# Patient Record
Sex: Male | Born: 1940 | Race: White | Hispanic: No | Marital: Married | Smoking: Former smoker
Health system: Southern US, Community
[De-identification: ages and names within clinical notes are randomized; demographics above are authoritative.]

## PROBLEM LIST (undated history)

## (undated) DIAGNOSIS — I2510 Atherosclerotic heart disease of native coronary artery without angina pectoris: Secondary | ICD-10-CM

## (undated) DIAGNOSIS — Z860101 Personal history of adenomatous and serrated colon polyps: Secondary | ICD-10-CM

## (undated) DIAGNOSIS — E785 Hyperlipidemia, unspecified: Secondary | ICD-10-CM

## (undated) DIAGNOSIS — H9190 Unspecified hearing loss, unspecified ear: Secondary | ICD-10-CM

## (undated) DIAGNOSIS — I4819 Other persistent atrial fibrillation: Secondary | ICD-10-CM

## (undated) DIAGNOSIS — IMO0002 Reserved for concepts with insufficient information to code with codable children: Secondary | ICD-10-CM

## (undated) DIAGNOSIS — Z86010 Personal history of colonic polyps: Secondary | ICD-10-CM

## (undated) DIAGNOSIS — G4730 Sleep apnea, unspecified: Secondary | ICD-10-CM

## (undated) DIAGNOSIS — G5620 Lesion of ulnar nerve, unspecified upper limb: Secondary | ICD-10-CM

## (undated) DIAGNOSIS — I10 Essential (primary) hypertension: Secondary | ICD-10-CM

## (undated) DIAGNOSIS — I872 Venous insufficiency (chronic) (peripheral): Secondary | ICD-10-CM

## (undated) DIAGNOSIS — I459 Conduction disorder, unspecified: Secondary | ICD-10-CM

## (undated) DIAGNOSIS — I442 Atrioventricular block, complete: Secondary | ICD-10-CM

## (undated) DIAGNOSIS — I4892 Unspecified atrial flutter: Secondary | ICD-10-CM

## (undated) HISTORY — PX: OTHER SURGICAL HISTORY: SHX169

## (undated) HISTORY — DX: Lesion of ulnar nerve, unspecified upper limb: G56.20

## (undated) HISTORY — DX: Unspecified atrial flutter: I48.92

## (undated) HISTORY — DX: Personal history of colonic polyps: Z86.010

## (undated) HISTORY — DX: Reserved for concepts with insufficient information to code with codable children: IMO0002

## (undated) HISTORY — DX: Hyperlipidemia, unspecified: E78.5

## (undated) HISTORY — DX: Conduction disorder, unspecified: I45.9

## (undated) HISTORY — DX: Venous insufficiency (chronic) (peripheral): I87.2

## (undated) HISTORY — DX: Personal history of adenomatous and serrated colon polyps: Z86.0101

## (undated) HISTORY — DX: Sleep apnea, unspecified: G47.30

## (undated) HISTORY — DX: Atherosclerotic heart disease of native coronary artery without angina pectoris: I25.10

## (undated) HISTORY — PX: VENTRAL HERNIA REPAIR: SHX424

## (undated) HISTORY — DX: Essential (primary) hypertension: I10

## (undated) HISTORY — PX: POLYPECTOMY: SHX149

---

## 1999-06-10 ENCOUNTER — Ambulatory Visit (HOSPITAL_COMMUNITY): Admission: RE | Admit: 1999-06-10 | Discharge: 1999-06-10 | Payer: Self-pay | Admitting: *Deleted

## 1999-06-10 ENCOUNTER — Encounter: Payer: Self-pay | Admitting: *Deleted

## 1999-07-27 ENCOUNTER — Inpatient Hospital Stay (HOSPITAL_COMMUNITY): Admission: RE | Admit: 1999-07-27 | Discharge: 1999-07-29 | Payer: Self-pay | Admitting: Orthopedic Surgery

## 1999-08-30 ENCOUNTER — Encounter: Admission: RE | Admit: 1999-08-30 | Discharge: 1999-11-25 | Payer: Self-pay | Admitting: Orthopedic Surgery

## 2000-11-21 ENCOUNTER — Encounter: Admission: RE | Admit: 2000-11-21 | Discharge: 2000-11-30 | Payer: Self-pay | Admitting: Specialist

## 2001-08-22 ENCOUNTER — Ambulatory Visit (HOSPITAL_COMMUNITY): Admission: RE | Admit: 2001-08-22 | Discharge: 2001-08-22 | Payer: Self-pay | Admitting: Gastroenterology

## 2003-09-13 HISTORY — PX: CORONARY ANGIOPLASTY WITH STENT PLACEMENT: SHX49

## 2003-10-07 ENCOUNTER — Inpatient Hospital Stay (HOSPITAL_COMMUNITY): Admission: EM | Admit: 2003-10-07 | Discharge: 2003-10-09 | Payer: Self-pay | Admitting: *Deleted

## 2003-10-17 ENCOUNTER — Inpatient Hospital Stay (HOSPITAL_COMMUNITY): Admission: EM | Admit: 2003-10-17 | Discharge: 2003-10-18 | Payer: Self-pay | Admitting: Emergency Medicine

## 2004-03-22 ENCOUNTER — Ambulatory Visit (HOSPITAL_COMMUNITY): Admission: RE | Admit: 2004-03-22 | Discharge: 2004-03-22 | Payer: Self-pay | Admitting: Gastroenterology

## 2004-07-07 ENCOUNTER — Ambulatory Visit: Payer: Self-pay | Admitting: Cardiovascular Disease

## 2004-07-16 ENCOUNTER — Ambulatory Visit: Payer: Self-pay | Admitting: Cardiovascular Disease

## 2004-10-15 ENCOUNTER — Ambulatory Visit: Payer: Self-pay | Admitting: Cardiovascular Disease

## 2004-10-22 ENCOUNTER — Inpatient Hospital Stay (HOSPITAL_BASED_OUTPATIENT_CLINIC_OR_DEPARTMENT_OTHER): Admission: RE | Admit: 2004-10-22 | Discharge: 2004-10-22 | Payer: Self-pay | Admitting: Cardiovascular Disease

## 2004-10-22 ENCOUNTER — Ambulatory Visit: Payer: Self-pay | Admitting: Cardiovascular Disease

## 2004-10-29 ENCOUNTER — Ambulatory Visit: Payer: Self-pay | Admitting: Internal Medicine

## 2004-11-02 ENCOUNTER — Ambulatory Visit: Payer: Self-pay | Admitting: Cardiovascular Disease

## 2005-02-01 ENCOUNTER — Ambulatory Visit: Payer: Self-pay | Admitting: Cardiovascular Disease

## 2005-03-17 ENCOUNTER — Ambulatory Visit: Payer: Self-pay | Admitting: Cardiovascular Disease

## 2005-04-25 ENCOUNTER — Ambulatory Visit: Payer: Self-pay | Admitting: Cardiovascular Disease

## 2005-05-20 ENCOUNTER — Ambulatory Visit: Payer: Self-pay | Admitting: Cardiovascular Disease

## 2005-08-27 ENCOUNTER — Emergency Department (HOSPITAL_COMMUNITY): Admission: EM | Admit: 2005-08-27 | Discharge: 2005-08-27 | Payer: Self-pay | Admitting: Emergency Medicine

## 2005-11-22 ENCOUNTER — Ambulatory Visit: Payer: Self-pay | Admitting: Cardiovascular Disease

## 2006-05-22 ENCOUNTER — Ambulatory Visit: Payer: Self-pay | Admitting: Cardiovascular Disease

## 2006-05-22 ENCOUNTER — Ambulatory Visit: Payer: Self-pay

## 2006-06-13 ENCOUNTER — Ambulatory Visit: Payer: Self-pay

## 2006-06-22 ENCOUNTER — Ambulatory Visit: Payer: Self-pay | Admitting: Cardiovascular Disease

## 2006-07-11 ENCOUNTER — Ambulatory Visit (HOSPITAL_COMMUNITY): Admission: RE | Admit: 2006-07-11 | Discharge: 2006-07-12 | Payer: Self-pay | Admitting: Orthopedic Surgery

## 2006-07-11 ENCOUNTER — Ambulatory Visit: Payer: Self-pay | Admitting: Cardiovascular Disease

## 2006-07-28 ENCOUNTER — Ambulatory Visit: Payer: Self-pay | Admitting: Cardiovascular Disease

## 2006-08-22 ENCOUNTER — Ambulatory Visit: Payer: Self-pay | Admitting: Pulmonary Disease

## 2006-10-18 ENCOUNTER — Ambulatory Visit: Payer: Self-pay | Admitting: Cardiovascular Disease

## 2007-04-25 ENCOUNTER — Ambulatory Visit: Payer: Self-pay

## 2007-04-25 LAB — CONVERTED CEMR LAB
ALT: 52 units/L (ref 0–53)
AST: 86 units/L — ABNORMAL HIGH (ref 0–37)
Cholesterol: 151 mg/dL (ref 0–200)
Direct LDL: 82.1 mg/dL
HDL: 28.9 mg/dL — ABNORMAL LOW (ref >39.0)
Total Protein: 9 g/dL — ABNORMAL HIGH (ref 6.0–8.3)

## 2007-04-26 ENCOUNTER — Ambulatory Visit: Payer: Self-pay | Admitting: Pulmonary Disease

## 2007-05-03 ENCOUNTER — Ambulatory Visit: Payer: Self-pay | Admitting: Cardiovascular Disease

## 2007-08-29 ENCOUNTER — Inpatient Hospital Stay (HOSPITAL_COMMUNITY): Admission: RE | Admit: 2007-08-29 | Discharge: 2007-08-30 | Payer: Self-pay | Admitting: General Surgery

## 2007-10-15 ENCOUNTER — Ambulatory Visit: Payer: Self-pay | Admitting: Cardiovascular Disease

## 2007-10-17 ENCOUNTER — Ambulatory Visit: Payer: Self-pay | Admitting: Cardiovascular Disease

## 2007-10-17 LAB — CONVERTED CEMR LAB
Bilirubin, Direct: 0.2 mg/dL (ref 0.0–0.3)
Cholesterol: 146 mg/dL (ref 0–200)
HDL: 27 mg/dL — ABNORMAL LOW (ref >39.0)
LDL Cholesterol: 102 mg/dL — ABNORMAL HIGH (ref 0–99)
Total Bilirubin: 1.1 mg/dL (ref 0.3–1.2)
Total CHOL/HDL Ratio: 5.4
Total Protein: 8.7 g/dL — ABNORMAL HIGH (ref 6.0–8.3)
Triglycerides: 87 mg/dL (ref 0–149)

## 2008-04-02 ENCOUNTER — Encounter: Admission: RE | Admit: 2008-04-02 | Discharge: 2008-04-02 | Payer: Self-pay | Admitting: Family Medicine

## 2008-04-30 ENCOUNTER — Ambulatory Visit: Payer: Self-pay | Admitting: Cardiovascular Disease

## 2008-05-01 ENCOUNTER — Ambulatory Visit: Payer: Self-pay

## 2008-07-29 ENCOUNTER — Ambulatory Visit: Payer: Self-pay | Admitting: Cardiovascular Disease

## 2008-10-31 ENCOUNTER — Encounter: Admission: RE | Admit: 2008-10-31 | Discharge: 2008-10-31 | Payer: Self-pay | Admitting: Neurosurgery

## 2009-01-03 DIAGNOSIS — T7840XA Allergy, unspecified, initial encounter: Secondary | ICD-10-CM | POA: Insufficient documentation

## 2009-01-03 DIAGNOSIS — I872 Venous insufficiency (chronic) (peripheral): Secondary | ICD-10-CM | POA: Insufficient documentation

## 2009-01-03 DIAGNOSIS — R609 Edema, unspecified: Secondary | ICD-10-CM | POA: Insufficient documentation

## 2009-01-03 DIAGNOSIS — G5620 Lesion of ulnar nerve, unspecified upper limb: Secondary | ICD-10-CM | POA: Insufficient documentation

## 2009-01-03 DIAGNOSIS — Z8679 Personal history of other diseases of the circulatory system: Secondary | ICD-10-CM | POA: Insufficient documentation

## 2009-01-03 DIAGNOSIS — I10 Essential (primary) hypertension: Secondary | ICD-10-CM | POA: Insufficient documentation

## 2009-01-03 DIAGNOSIS — R0602 Shortness of breath: Secondary | ICD-10-CM | POA: Insufficient documentation

## 2009-01-14 ENCOUNTER — Ambulatory Visit: Payer: Self-pay | Admitting: Cardiovascular Disease

## 2009-01-14 DIAGNOSIS — M5430 Sciatica, unspecified side: Secondary | ICD-10-CM | POA: Insufficient documentation

## 2009-01-14 DIAGNOSIS — I471 Supraventricular tachycardia: Secondary | ICD-10-CM

## 2009-01-14 DIAGNOSIS — I480 Paroxysmal atrial fibrillation: Secondary | ICD-10-CM | POA: Insufficient documentation

## 2009-01-15 ENCOUNTER — Telehealth: Payer: Self-pay | Admitting: Cardiovascular Disease

## 2009-01-20 ENCOUNTER — Ambulatory Visit: Payer: Self-pay | Admitting: Internal Medicine

## 2009-01-20 ENCOUNTER — Encounter: Payer: Self-pay | Admitting: Cardiovascular Disease

## 2009-01-26 ENCOUNTER — Ambulatory Visit: Payer: Self-pay | Admitting: Cardiovascular Disease

## 2009-01-29 ENCOUNTER — Ambulatory Visit: Payer: Self-pay | Admitting: Internal Medicine

## 2009-01-29 DIAGNOSIS — I495 Sick sinus syndrome: Secondary | ICD-10-CM | POA: Insufficient documentation

## 2009-01-29 DIAGNOSIS — I442 Atrioventricular block, complete: Secondary | ICD-10-CM | POA: Insufficient documentation

## 2009-02-05 ENCOUNTER — Ambulatory Visit: Payer: Self-pay | Admitting: Internal Medicine

## 2009-02-05 ENCOUNTER — Ambulatory Visit: Payer: Self-pay | Admitting: Cardiovascular Disease

## 2009-02-05 LAB — CONVERTED CEMR LAB
POC INR: 3.2
Protime: 21.6

## 2009-02-10 ENCOUNTER — Encounter: Payer: Self-pay | Admitting: *Deleted

## 2009-02-12 ENCOUNTER — Encounter: Payer: Self-pay | Admitting: Internal Medicine

## 2009-02-12 ENCOUNTER — Ambulatory Visit: Payer: Self-pay | Admitting: Cardiology

## 2009-02-12 ENCOUNTER — Telehealth (INDEPENDENT_AMBULATORY_CARE_PROVIDER_SITE_OTHER): Payer: Self-pay | Admitting: *Deleted

## 2009-02-12 LAB — CONVERTED CEMR LAB: Protime: 20.4

## 2009-02-19 ENCOUNTER — Ambulatory Visit: Payer: Self-pay | Admitting: Cardiovascular Disease

## 2009-02-27 ENCOUNTER — Encounter (INDEPENDENT_AMBULATORY_CARE_PROVIDER_SITE_OTHER): Payer: Self-pay | Admitting: Cardiology

## 2009-02-27 ENCOUNTER — Telehealth: Payer: Self-pay | Admitting: Internal Medicine

## 2009-02-27 ENCOUNTER — Ambulatory Visit: Payer: Self-pay | Admitting: Internal Medicine

## 2009-02-27 ENCOUNTER — Ambulatory Visit: Payer: Self-pay | Admitting: Cardiology

## 2009-03-02 ENCOUNTER — Inpatient Hospital Stay (HOSPITAL_COMMUNITY): Admission: AD | Admit: 2009-03-02 | Discharge: 2009-03-03 | Payer: Self-pay | Admitting: Internal Medicine

## 2009-03-02 ENCOUNTER — Ambulatory Visit: Payer: Self-pay | Admitting: Internal Medicine

## 2009-03-02 LAB — CONVERTED CEMR LAB
BUN: 7 mg/dL (ref 6–23)
Chloride: 100 meq/L (ref 96–112)
GFR calc non Af Amer: 118.83 mL/min (ref >60)
Glucose, Bld: 115 mg/dL — ABNORMAL HIGH (ref 70–99)
HCT: 42 % (ref 39.0–52.0)
Lymphocytes Relative: 27 % (ref 12–46)
Lymphs Abs: 1.8 10*3/uL (ref 0.7–4.0)
MCV: 95.3 fL (ref 78.0–100.0)
Monocytes Relative: 8 % (ref 3–12)
Neutrophils Relative %: 65 % (ref 43–77)
Platelets: 175 10*3/uL (ref 150–400)
Potassium: 4.2 meq/L (ref 3.5–5.1)
RBC: 4.41 M/uL (ref 4.22–5.81)
Sodium: 135 meq/L (ref 135–145)
WBC: 6.8 10*3/uL (ref 4.0–10.5)
aPTT: 44.9 s — ABNORMAL HIGH (ref 21.7–28.8)

## 2009-03-10 ENCOUNTER — Encounter (INDEPENDENT_AMBULATORY_CARE_PROVIDER_SITE_OTHER): Payer: Self-pay | Admitting: Pharmacist

## 2009-03-10 ENCOUNTER — Ambulatory Visit: Payer: Self-pay | Admitting: Cardiology

## 2009-03-13 ENCOUNTER — Telehealth: Payer: Self-pay | Admitting: Internal Medicine

## 2009-03-18 ENCOUNTER — Encounter: Payer: Self-pay | Admitting: *Deleted

## 2009-03-19 ENCOUNTER — Ambulatory Visit: Payer: Self-pay | Admitting: Internal Medicine

## 2009-03-19 ENCOUNTER — Ambulatory Visit: Payer: Self-pay

## 2009-03-19 LAB — CONVERTED CEMR LAB
POC INR: 3.4
Prothrombin Time: 22.2 s

## 2009-04-01 ENCOUNTER — Ambulatory Visit: Payer: Self-pay | Admitting: Internal Medicine

## 2009-04-01 LAB — CONVERTED CEMR LAB: Prothrombin Time: 21.2 s

## 2009-04-09 ENCOUNTER — Telehealth: Payer: Self-pay | Admitting: Cardiovascular Disease

## 2009-04-22 ENCOUNTER — Ambulatory Visit: Payer: Self-pay | Admitting: Cardiology

## 2009-04-22 LAB — CONVERTED CEMR LAB
POC INR: 1.7
Prothrombin Time: 16.1 s

## 2009-05-06 ENCOUNTER — Ambulatory Visit: Payer: Self-pay | Admitting: Cardiovascular Disease

## 2009-05-19 ENCOUNTER — Telehealth: Payer: Self-pay | Admitting: Cardiovascular Disease

## 2009-05-20 ENCOUNTER — Ambulatory Visit: Payer: Self-pay | Admitting: Internal Medicine

## 2009-05-29 ENCOUNTER — Ambulatory Visit: Payer: Self-pay | Admitting: Cardiovascular Disease

## 2009-06-12 ENCOUNTER — Ambulatory Visit: Payer: Self-pay | Admitting: Pulmonary Disease

## 2009-06-12 ENCOUNTER — Ambulatory Visit: Payer: Self-pay | Admitting: Internal Medicine

## 2009-06-12 DIAGNOSIS — G4733 Obstructive sleep apnea (adult) (pediatric): Secondary | ICD-10-CM | POA: Insufficient documentation

## 2009-06-24 ENCOUNTER — Ambulatory Visit: Payer: Self-pay | Admitting: Cardiology

## 2009-07-22 ENCOUNTER — Ambulatory Visit: Payer: Self-pay | Admitting: Internal Medicine

## 2009-07-22 LAB — CONVERTED CEMR LAB: POC INR: 1.4

## 2009-08-03 ENCOUNTER — Ambulatory Visit: Payer: Self-pay | Admitting: Cardiovascular Disease

## 2009-08-03 LAB — CONVERTED CEMR LAB: POC INR: 1.6

## 2009-08-17 ENCOUNTER — Ambulatory Visit: Payer: Self-pay | Admitting: Cardiology

## 2009-08-31 ENCOUNTER — Ambulatory Visit: Payer: Self-pay | Admitting: Internal Medicine

## 2009-08-31 LAB — CONVERTED CEMR LAB: POC INR: 2.7

## 2009-09-12 HISTORY — PX: PACEMAKER PLACEMENT: SHX43

## 2009-09-15 ENCOUNTER — Ambulatory Visit: Payer: Self-pay | Admitting: Cardiovascular Disease

## 2009-09-15 LAB — CONVERTED CEMR LAB: POC INR: 1.9

## 2009-10-01 ENCOUNTER — Ambulatory Visit: Payer: Self-pay | Admitting: Cardiovascular Disease

## 2009-10-02 ENCOUNTER — Encounter: Payer: Self-pay | Admitting: Cardiology

## 2009-10-07 HISTORY — PX: COLONOSCOPY: SHX174

## 2009-10-19 ENCOUNTER — Telehealth: Payer: Self-pay | Admitting: Cardiovascular Disease

## 2009-10-26 ENCOUNTER — Telehealth: Payer: Self-pay | Admitting: Cardiovascular Disease

## 2010-01-26 ENCOUNTER — Encounter: Payer: Self-pay | Admitting: Cardiovascular Disease

## 2010-02-02 ENCOUNTER — Encounter: Payer: Self-pay | Admitting: Cardiovascular Disease

## 2010-02-18 ENCOUNTER — Ambulatory Visit: Payer: Self-pay | Admitting: Oncology

## 2010-02-23 ENCOUNTER — Encounter: Payer: Self-pay | Admitting: Cardiovascular Disease

## 2010-02-23 LAB — CBC WITH DIFFERENTIAL/PLATELET
Basophils Absolute: 0 10*3/uL (ref 0.0–0.1)
Eosinophils Absolute: 0.2 10*3/uL (ref 0.0–0.5)
HCT: 38.6 % (ref 38.4–49.9)
HGB: 14.9 g/dL (ref 13.0–17.1)
MCH: 37.7 pg — ABNORMAL HIGH (ref 27.2–33.4)
MCV: 97.8 fL (ref 79.3–98.0)
MONO%: 10.8 % (ref 0.0–14.0)
NEUT#: 4.6 10*3/uL (ref 1.5–6.5)
NEUT%: 69.9 % (ref 39.0–75.0)
RDW: 13.1 % (ref 11.0–14.6)
lymph#: 1.1 10*3/uL (ref 0.9–3.3)

## 2010-02-23 LAB — MORPHOLOGY: PLT EST: ADEQUATE

## 2010-02-24 ENCOUNTER — Telehealth: Payer: Self-pay | Admitting: Cardiovascular Disease

## 2010-02-25 LAB — KAPPA/LAMBDA LIGHT CHAINS
Kappa free light chain: 0.77 mg/dL (ref 0.33–1.94)
Kappa:Lambda Ratio: 0.22 — ABNORMAL LOW (ref 0.26–1.65)
Lambda Free Lght Chn: 3.47 mg/dL — ABNORMAL HIGH (ref 0.57–2.63)

## 2010-02-25 LAB — COMPREHENSIVE METABOLIC PANEL
ALT: 44 U/L (ref 0–53)
AST: 57 U/L — ABNORMAL HIGH (ref 0–37)
Albumin: 3.8 g/dL (ref 3.5–5.2)
Alkaline Phosphatase: 67 U/L (ref 39–117)
Calcium: 9 mg/dL (ref 8.4–10.5)
Chloride: 99 mEq/L (ref 96–112)
Potassium: 4.2 mEq/L (ref 3.5–5.3)
Sodium: 138 mEq/L (ref 135–145)
Total Protein: 8.5 g/dL — ABNORMAL HIGH (ref 6.0–8.3)

## 2010-02-25 LAB — IMMUNOFIXATION ELECTROPHORESIS: IgM, Serum: 5060 mg/dL — ABNORMAL HIGH (ref 60–263)

## 2010-03-12 ENCOUNTER — Ambulatory Visit (HOSPITAL_COMMUNITY): Admission: RE | Admit: 2010-03-12 | Discharge: 2010-03-12 | Payer: Self-pay | Admitting: Oncology

## 2010-03-19 ENCOUNTER — Ambulatory Visit (HOSPITAL_COMMUNITY): Admission: RE | Admit: 2010-03-19 | Discharge: 2010-03-19 | Payer: Self-pay | Admitting: Oncology

## 2010-03-23 ENCOUNTER — Encounter: Payer: Self-pay | Admitting: Cardiovascular Disease

## 2010-03-23 ENCOUNTER — Ambulatory Visit: Payer: Self-pay | Admitting: Oncology

## 2010-03-23 LAB — CBC WITH DIFFERENTIAL/PLATELET
BASO%: 0.4 % (ref 0.0–2.0)
Basophils Absolute: 0 10*3/uL (ref 0.0–0.1)
EOS%: 3.1 % (ref 0.0–7.0)
HCT: 45.1 % (ref 38.4–49.9)
HGB: 15.7 g/dL (ref 13.0–17.1)
MCH: 34.7 pg — ABNORMAL HIGH (ref 27.2–33.4)
MCV: 99.6 fL — ABNORMAL HIGH (ref 79.3–98.0)
MONO%: 9.1 % (ref 0.0–14.0)
NEUT%: 77 % — ABNORMAL HIGH (ref 39.0–75.0)
nRBC: 0 % (ref 0–0)

## 2010-03-24 LAB — COMPREHENSIVE METABOLIC PANEL
ALT: 39 U/L (ref 0–53)
AST: 49 U/L — ABNORMAL HIGH (ref 0–37)
Albumin: 3.7 g/dL (ref 3.5–5.2)
Alkaline Phosphatase: 70 U/L (ref 39–117)
BUN: 4 mg/dL — ABNORMAL LOW (ref 6–23)
Calcium: 9.5 mg/dL (ref 8.4–10.5)
Chloride: 97 mEq/L (ref 96–112)
Potassium: 4.3 mEq/L (ref 3.5–5.3)
Sodium: 135 mEq/L (ref 135–145)

## 2010-03-24 LAB — IGG, IGA, IGM: IgM, Serum: 5000 mg/dL — ABNORMAL HIGH (ref 60–263)

## 2010-03-24 LAB — LACTATE DEHYDROGENASE: LDH: 163 U/L (ref 94–250)

## 2010-04-06 ENCOUNTER — Ambulatory Visit: Payer: Self-pay | Admitting: Cardiovascular Disease

## 2010-04-12 ENCOUNTER — Telehealth: Payer: Self-pay | Admitting: Cardiovascular Disease

## 2010-04-15 ENCOUNTER — Ambulatory Visit (HOSPITAL_COMMUNITY): Admission: RE | Admit: 2010-04-15 | Discharge: 2010-04-15 | Payer: Self-pay | Admitting: Oncology

## 2010-04-15 ENCOUNTER — Ambulatory Visit: Payer: Self-pay | Admitting: Oncology

## 2010-05-19 ENCOUNTER — Ambulatory Visit: Payer: Self-pay | Admitting: Oncology

## 2010-05-24 ENCOUNTER — Telehealth: Payer: Self-pay | Admitting: Cardiovascular Disease

## 2010-05-24 LAB — CBC WITH DIFFERENTIAL/PLATELET
Basophils Absolute: 0 10*3/uL (ref 0.0–0.1)
EOS%: 4.1 % (ref 0.0–7.0)
HGB: 15.6 g/dL (ref 13.0–17.1)
MCH: 36.3 pg — ABNORMAL HIGH (ref 27.2–33.4)
MONO#: 0.6 10*3/uL (ref 0.1–0.9)
NEUT#: 3.9 10*3/uL (ref 1.5–6.5)
RDW: 12.5 % (ref 11.0–14.6)
WBC: 5.8 10*3/uL (ref 4.0–10.3)
lymph#: 1 10*3/uL (ref 0.9–3.3)

## 2010-05-24 LAB — COMPREHENSIVE METABOLIC PANEL
AST: 37 U/L (ref 0–37)
Albumin: 3.8 g/dL (ref 3.5–5.2)
Alkaline Phosphatase: 57 U/L (ref 39–117)
Glucose, Bld: 89 mg/dL (ref 70–99)
Potassium: 4 mEq/L (ref 3.5–5.3)
Sodium: 134 mEq/L — ABNORMAL LOW (ref 135–145)
Total Protein: 8.2 g/dL (ref 6.0–8.3)

## 2010-06-11 ENCOUNTER — Ambulatory Visit: Payer: Self-pay | Admitting: Pulmonary Disease

## 2010-06-24 ENCOUNTER — Encounter: Payer: Self-pay | Admitting: Cardiovascular Disease

## 2010-07-01 ENCOUNTER — Ambulatory Visit: Payer: Self-pay | Admitting: Internal Medicine

## 2010-07-01 ENCOUNTER — Inpatient Hospital Stay (HOSPITAL_COMMUNITY): Admission: EM | Admit: 2010-07-01 | Discharge: 2010-07-03 | Payer: Self-pay | Admitting: Emergency Medicine

## 2010-07-02 ENCOUNTER — Encounter: Payer: Self-pay | Admitting: Internal Medicine

## 2010-07-06 ENCOUNTER — Encounter: Payer: Self-pay | Admitting: Internal Medicine

## 2010-07-06 ENCOUNTER — Ambulatory Visit: Payer: Self-pay | Admitting: Cardiology

## 2010-07-06 LAB — CONVERTED CEMR LAB: POC INR: 1.3

## 2010-07-12 ENCOUNTER — Encounter: Payer: Self-pay | Admitting: Internal Medicine

## 2010-07-13 ENCOUNTER — Ambulatory Visit: Payer: Self-pay

## 2010-07-13 ENCOUNTER — Ambulatory Visit: Payer: Self-pay | Admitting: Cardiovascular Disease

## 2010-07-13 ENCOUNTER — Encounter: Payer: Self-pay | Admitting: Internal Medicine

## 2010-07-21 ENCOUNTER — Ambulatory Visit: Payer: Self-pay | Admitting: Cardiovascular Disease

## 2010-07-21 ENCOUNTER — Encounter: Payer: Self-pay | Admitting: Cardiovascular Disease

## 2010-07-21 DIAGNOSIS — C9000 Multiple myeloma not having achieved remission: Secondary | ICD-10-CM | POA: Insufficient documentation

## 2010-07-21 DIAGNOSIS — F17200 Nicotine dependence, unspecified, uncomplicated: Secondary | ICD-10-CM | POA: Insufficient documentation

## 2010-07-22 ENCOUNTER — Ambulatory Visit: Payer: Self-pay | Admitting: Oncology

## 2010-07-26 ENCOUNTER — Telehealth (INDEPENDENT_AMBULATORY_CARE_PROVIDER_SITE_OTHER): Payer: Self-pay | Admitting: *Deleted

## 2010-07-26 ENCOUNTER — Encounter: Payer: Self-pay | Admitting: Cardiovascular Disease

## 2010-07-26 LAB — IGG, IGA, IGM
IgA: 85 mg/dL (ref 68–378)
IgG (Immunoglobin G), Serum: 384 mg/dL — ABNORMAL LOW (ref 694–1618)
IgM, Serum: 3980 mg/dL — ABNORMAL HIGH (ref 60–263)

## 2010-07-26 LAB — COMPREHENSIVE METABOLIC PANEL
ALT: 21 U/L (ref 0–53)
CO2: 27 mEq/L (ref 19–32)
Creatinine, Ser: 0.66 mg/dL (ref 0.40–1.50)
Glucose, Bld: 89 mg/dL (ref 70–99)
Total Bilirubin: 0.5 mg/dL (ref 0.3–1.2)

## 2010-07-26 LAB — CBC WITH DIFFERENTIAL/PLATELET
Eosinophils Absolute: 0.2 10*3/uL (ref 0.0–0.5)
LYMPH%: 26.6 % (ref 14.0–49.0)
MCV: 95.6 fL (ref 79.3–98.0)
MONO%: 13.5 % (ref 0.0–14.0)
NEUT#: 2.5 10*3/uL (ref 1.5–6.5)
Platelets: 172 10*3/uL (ref 140–400)
RBC: 4.73 10*6/uL (ref 4.20–5.82)
nRBC: 0 % (ref 0–0)

## 2010-07-26 LAB — LACTATE DEHYDROGENASE: LDH: 139 U/L (ref 94–250)

## 2010-07-27 ENCOUNTER — Telehealth: Payer: Self-pay | Admitting: Cardiovascular Disease

## 2010-08-04 ENCOUNTER — Encounter: Payer: Self-pay | Admitting: Internal Medicine

## 2010-08-04 ENCOUNTER — Ambulatory Visit: Payer: Self-pay | Admitting: Internal Medicine

## 2010-08-10 ENCOUNTER — Ambulatory Visit: Payer: Self-pay | Admitting: Cardiology

## 2010-08-17 ENCOUNTER — Ambulatory Visit: Payer: Self-pay | Admitting: Cardiology

## 2010-08-24 ENCOUNTER — Ambulatory Visit: Payer: Self-pay | Admitting: Cardiovascular Disease

## 2010-08-24 ENCOUNTER — Encounter: Payer: Self-pay | Admitting: Cardiovascular Disease

## 2010-08-24 ENCOUNTER — Ambulatory Visit: Payer: Self-pay | Admitting: Cardiology

## 2010-08-24 LAB — CONVERTED CEMR LAB: POC INR: 2.4

## 2010-08-31 ENCOUNTER — Ambulatory Visit: Payer: Self-pay | Admitting: Cardiology

## 2010-08-31 LAB — CONVERTED CEMR LAB
INR: 3.4
POC INR: 3.4

## 2010-09-08 ENCOUNTER — Ambulatory Visit: Payer: Self-pay | Admitting: Cardiology

## 2010-09-08 LAB — CONVERTED CEMR LAB: POC INR: 3.2

## 2010-09-14 ENCOUNTER — Ambulatory Visit
Admission: RE | Admit: 2010-09-14 | Discharge: 2010-09-14 | Payer: Self-pay | Source: Home / Self Care | Attending: Internal Medicine | Admitting: Internal Medicine

## 2010-09-14 ENCOUNTER — Encounter: Payer: Self-pay | Admitting: Internal Medicine

## 2010-09-14 ENCOUNTER — Ambulatory Visit: Admission: RE | Admit: 2010-09-14 | Discharge: 2010-09-14 | Payer: Self-pay | Source: Home / Self Care

## 2010-09-14 DIAGNOSIS — Z950 Presence of cardiac pacemaker: Secondary | ICD-10-CM | POA: Insufficient documentation

## 2010-09-14 LAB — CONVERTED CEMR LAB: POC INR: 3

## 2010-09-17 ENCOUNTER — Telehealth: Payer: Self-pay | Admitting: Internal Medicine

## 2010-09-21 ENCOUNTER — Ambulatory Visit
Admission: RE | Admit: 2010-09-21 | Discharge: 2010-09-21 | Payer: Self-pay | Source: Home / Self Care | Attending: Internal Medicine | Admitting: Internal Medicine

## 2010-09-21 ENCOUNTER — Ambulatory Visit: Admission: RE | Admit: 2010-09-21 | Discharge: 2010-09-21 | Payer: Self-pay | Source: Home / Self Care

## 2010-09-21 ENCOUNTER — Other Ambulatory Visit: Payer: Self-pay | Admitting: Internal Medicine

## 2010-09-21 ENCOUNTER — Encounter: Payer: Self-pay | Admitting: Internal Medicine

## 2010-09-21 LAB — BASIC METABOLIC PANEL
BUN: 11 mg/dL (ref 6–23)
CO2: 30 mEq/L (ref 19–32)
Calcium: 9.5 mg/dL (ref 8.4–10.5)
Chloride: 97 mEq/L (ref 96–112)
Creatinine, Ser: 0.6 mg/dL (ref 0.4–1.5)
GFR: 153.03 mL/min (ref >60.00)
Glucose, Bld: 81 mg/dL (ref 70–99)
Potassium: 4.5 mEq/L (ref 3.5–5.1)
Sodium: 137 mEq/L (ref 135–145)

## 2010-09-21 LAB — APTT: aPTT: 49 s — ABNORMAL HIGH (ref 21.7–28.8)

## 2010-09-21 LAB — PROTIME-INR
INR: 3.2 ratio — ABNORMAL HIGH (ref 0.8–1.0)
Prothrombin Time: 32.1 s — ABNORMAL HIGH (ref 10.2–12.4)

## 2010-09-22 LAB — CONVERTED CEMR LAB
Basophils Absolute: 0 10*3/uL (ref 0.0–0.1)
Basophils Relative: 1 % (ref 0–1)
Eosinophils Absolute: 0.3 10*3/uL (ref 0.0–0.7)
Eosinophils Relative: 4 % (ref 0–5)
Hemoglobin: 15.3 g/dL (ref 13.0–17.0)
MCHC: 34.3 g/dL (ref 30.0–36.0)
MCV: 96.5 fL (ref 78.0–100.0)
Monocytes Absolute: 0.9 10*3/uL (ref 0.1–1.0)
Monocytes Relative: 13 % — ABNORMAL HIGH (ref 3–12)
Neutro Abs: 3.8 10*3/uL (ref 1.7–7.7)
RDW: 12.8 % (ref 11.5–15.5)

## 2010-09-23 ENCOUNTER — Ambulatory Visit: Admit: 2010-09-23 | Payer: Self-pay | Admitting: Cardiovascular Disease

## 2010-09-23 ENCOUNTER — Ambulatory Visit: Admit: 2010-09-23 | Payer: Self-pay

## 2010-09-27 ENCOUNTER — Ambulatory Visit (HOSPITAL_COMMUNITY)
Admission: RE | Admit: 2010-09-27 | Discharge: 2010-09-27 | Payer: Self-pay | Source: Home / Self Care | Attending: Internal Medicine | Admitting: Internal Medicine

## 2010-09-28 ENCOUNTER — Telehealth: Payer: Self-pay | Admitting: Internal Medicine

## 2010-09-29 LAB — PROTIME-INR
INR: 3.72 — ABNORMAL HIGH (ref 0.00–1.49)
Prothrombin Time: 36.8 seconds — ABNORMAL HIGH (ref 11.6–15.2)

## 2010-09-30 ENCOUNTER — Encounter: Payer: Self-pay | Admitting: Internal Medicine

## 2010-09-30 NOTE — Op Note (Signed)
  NAMENAPOLEON, MONACELLI NO.:  [id number]  MEDICAL RECORD NO.:  [id number]          PATIENT TYPE:  OIB  LOCATION:  2899                         FACILITY:  [facility]  PHYSICIAN:  Duke Salvia, MD, FACCDATE OF BIRTH:  Dec 18, 1940  DATE OF PROCEDURE:  09/27/2010 DATE OF DISCHARGE:  09/27/2010                              OPERATIVE REPORT   PREOPERATIVE DIAGNOSIS:  Atrial flutter.  POSTOPERATIVE DIAGNOSIS:  Sinus rhythm.  PROCEDURE:  Cardioversion.  The patient was admitted for sedation.  While sedated, I decided to try rapid atrial pacing.  Pacing at 160 msec successfully terminated atrial flutter and the patient resumed sinus rhythm.  The patient was allowed to awaken and was discharged to home.     Duke Salvia, MD, [facility]     SCK/MEDQ  D:  09/27/2010  T:  09/28/2010  Job:  [id number]  Electronically Signed by Sherryl Manges MD [facility] on 09/30/2010 01:42:16 PM

## 2010-10-04 ENCOUNTER — Ambulatory Visit: Admission: RE | Admit: 2010-10-04 | Discharge: 2010-10-04 | Payer: Self-pay | Source: Home / Self Care

## 2010-10-08 ENCOUNTER — Ambulatory Visit: Admit: 2010-10-08 | Payer: Self-pay

## 2010-10-11 ENCOUNTER — Ambulatory Visit: Admission: RE | Admit: 2010-10-11 | Discharge: 2010-10-11 | Payer: Self-pay | Source: Home / Self Care

## 2010-10-12 NOTE — Letter (Signed)
Summary: [name] Office Visit Note   [name] Office Visit Note   Imported By: [name] Ovens 07/12/2010 10:41:59  _____________________________________________________________________  External Attachment:    Type:   Image     Comment:   External Document

## 2010-10-12 NOTE — Cardiovascular Report (Signed)
Summary: Office Visit   Office Visit   Imported By: [name] Ovens 08/16/2010 16:15:41  _____________________________________________________________________  External Attachment:    Type:   Image     Comment:   External Document

## 2010-10-12 NOTE — Assessment & Plan Note (Signed)
Summary: eph/dc 10.22.11 [facility]/mj   Referring Provider:  Charlton Haws Primary Provider:  Avva  CC:  follow up.  History of Present Illness: [name] is seen post hospital D/C.  I reviewed his D/C notes.  He had palpitaitons and SSCP.  He has had a previous flutter ablatoin but was found to be in recurrent flutter.  He has a history of proximal dominant circ stent and under went cath by TS for his SSCP.  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  Patient also had severe bradycardia with pauses.  SK/TS decided to place a pacer and F/U consdieration of [facility] or repeat ablation.  CAD to be Rx medically initially.  He is under a lot of stress and started smoking again.  I counseled him for less than 10 minutes on cessation especially in light of his distal circ stenosis.  Denies current angina.   He also is being actively w/u for myeloma with recent BM biipsy.  He has a cancer clinci F/U with Dr Arline Asp  Significant issues with left knee.  Recent injection by Dr Thomasena Edis.  May need knee surgery but this will have to be delayed until flutter and CAD Rx.    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 Take One Tablet By Mouth Daily 2)  Atenolol 50 Mg Tabs (Atenolol) .... Take One Tablet By Mouth Daily 3)  Multivitamins   Tabs (Multiple Vitamin) .Marland Kitchen.. 1 Tab By Mouth Once Daily 4)  Fish Oil 1200 Mg Caps (Omega-3 Fatty  Acids) .... Once Daily 5)  Vitamin C 500 Mg Tabs (Ascorbic Acid) .... Once Daily 6)  Glucosamine-Chondroitin   Caps (Glucosamine-Chondroit-Vit C-Mn) .... Once Daily 7)  Acetaminophen 500 Mg  Caps (Acetaminophen) .... As Needed 8)  Nitrostat 0.4 Mg Subl (Nitroglycerin) .Marland Kitchen.. 1 Tablet Under Tongue At Onset of Chest Pain; You May Repeat Every 5 Minutes For Up To 3 Doses. 9)  Warfarin Sodium 5 Mg Tabs (Warfarin Sodium) .... 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  Lower extremity varicosities.  Past Surgical History: Last updated: 01/03/2009 stenting of the AV circumflex, ostium of the first obtuse marginal branch. Last intervention 2005 cath 2006 patent stent   ventral hernia repair with Dr. Abbey Chatters.   right olecranon nerve surgery with Dr. [name].   Family History: Last updated: 06/12/2009 heart disease: father, brother cancer: father (prostate)   Social History: Last updated: 06/12/2009 Married with children. Alcohol occasional Retired from Therapist, sports.  still farms part-time.   Patient states former smoker.  Started at age 62.  1 ppd.  quit 2006.  Review of Systems       Denies fever, malais, weight loss, blurry vision, decreased visual acuity, cough, sputum, SOB, hemoptysis, pleuritic pain, palpitaitons, heartburn, abdominal pain, melena, lower extremity edema, claudication, or rash.   Vital Signs:  Patient profile:   70 year old male Height:      73 inches Weight:      216 pounds BMI:      28.60 Pulse rate:   80 / minute Pulse rhythm:   irregular Resp:     16 per minute BP sitting:   134 / 80  (left arm)  Vitals Entered By: Kem Parkinson (July 21, 2010 9:18 AM)  Physical Exam  General:  Affect appropriate Healthy:  appears stated age HEENT: normal Neck supple with no adenopathy JVP normal no bruits no thyromegaly Lungs clear with no wheezing and good diaphragmatic motion Heart:  S1/S2 no murmur,rub, gallop or click PMI normal Abdomen: benighn, BS positve, no tenderness, no AAA no bruit.  No HSM or HJR Distal pulses intact with no bruits No edema Neuro non-focal Skin warm and dry    PPM Specifications Following MD:  Sherryl Manges, MD     PPM Vendor:  [employer]     PPM Model Number:  [id number]     PPM Serial Number:  [id number] PPM DOI:  07/02/2010     PPM Implanting MD:  Sherryl Manges, MD  Lead 1    Location: RA     DOI: 07/02/2010     Model #: [id number]     Serial #: [id number]     Status: active Lead 2    Location: RV     DOI: 07/02/2010     Model #: [id number]     Serial #: [id number]     Status: active  Magnet Response Rate:  BOL 100 ERI 85  Indications:  Brady   Episodes Coumadin:  Yes  Parameters Mode:  DDD     Lower Rate Limit:  60     Upper Rate Limit:  120 Paced AV Delay:  200     Sensed AV Delay:  200  Impression & Recommendations:  Problem # 1:  ATRIAL FLUTTER (ICD-427.32) Continue BB and coumadin.  F/U with SK.  Prefer for him to have repeat ablation scheduled.  Will consider coronary intervention 6-8 weeks post ablation  His updated medication list for this problem includes:    Aspirin Ec 325 Mg Tbec (Aspirin) .Marland Kitchen... Take one tablet by mouth daily    Atenolol 50 Mg Tabs (Atenolol) .Marland Kitchen... Take one tablet by mouth daily    Warfarin Sodium 5 Mg Tabs (Warfarin sodium) ..... Use as directed by anticoagulation clinic  Problem # 2:  SICK SINUS/ TACHY-BRADY SYNDROME (ICD-427.81) Pacemaker working well.  No tachypalpitations on BB His updated medication  list for this problem includes:    Aspirin Ec 325 Mg Tbec (Aspirin) .Marland Kitchen... Take one tablet by mouth daily    Atenolol 50 Mg Tabs (Atenolol) .Marland Kitchen... Take one tablet by mouth daily    Nitrostat 0.4 Mg Subl (Nitroglycerin) .Marland Kitchen... 1 tablet under tongue at onset of chest pain; you may repeat every 5 minutes for up to 3 doses.    Warfarin Sodium 5 Mg Tabs (Warfarin sodium) ..... Use as directed by anticoagulation clinic  Problem # 3:  CAD; PRIOR PCI CX (ICD-414.00) Continue medical Rx.  Consdier increasing BB and adding imdur if angina recures.  Has nitro with him His updated medication  list for this problem includes:    Aspirin Ec 325 Mg Tbec (Aspirin) .Marland Kitchen... Take one tablet by mouth daily    Atenolol 50 Mg Tabs (Atenolol) .Marland Kitchen... Take one tablet by mouth daily    Nitrostat 0.4 Mg Subl (Nitroglycerin) .Marland Kitchen... 1 tablet under tongue at onset of chest pain; you may repeat every 5 minutes for up to 3 doses.    Warfarin Sodium 5 Mg Tabs (Warfarin sodium) ..... Use as directed by anticoagulation clinic  Problem # 4:  HYPERTENSION (ICD-401.9)  Well controlled His updated medication list for this problem includes:    Aspirin Ec 325 Mg Tbec (Aspirin) .Marland Kitchen... Take one tablet by mouth daily    Atenolol 50 Mg Tabs (Atenolol) .Marland Kitchen... Take one tablet by mouth daily  His updated medication list for this problem includes:    Aspirin Ec 325 Mg Tbec (Aspirin) .Marland Kitchen... Take one tablet by mouth daily    Atenolol 50 Mg Tabs (Atenolol) .Marland Kitchen... Take one tablet by mouth daily  Problem # 5:  SMOKER (ICD-305.1) Offered Chantix to paitent  Wife has used this to quit.  Patinet will call us if he cannot quit on his own  Problem # 6:  MULTIPLE  MYELOMA (ICD-203.00) F/U cancer clinci.  May have some bearing on Rx of CAD with cell counts and PLT's  Patient Instructions: 1)  Your physician recommends that you schedule a follow-up appointment in: 4 weeks after August 04, 2010.   EKG Report  Procedure date:  07/21/2010  Findings:       Vpacing with underlying atrial flutter

## 2010-10-12 NOTE — Medication Information (Signed)
Summary: rov/sl  Anticoagulant Therapy  Managed by: Bethena Midget, RN, BSN Referring MD: Eden Emms MD, Theron Arista PCP: Felipa Eth Supervising MD: Patty Sermons Indication 1: Atrial Flutter Lab Used: [facility] Site: [address] INR POC 1.9 INR RANGE 2 - 3  Dietary changes: no    Health status changes: no    Bleeding/hemorrhagic complications: no    Recent/future hospitalizations: no    Any changes in medication regimen? no    Recent/future dental: no  Any missed doses?: no       Is patient compliant with meds? yes      Comments: Pending DCCV  Allergies: No Known Drug Allergies  Anticoagulation Management History:      The patient is taking warfarin and comes in today for a routine follow up visit.  Positive risk factors for bleeding include an age of 71 years or older.  The bleeding index is 'intermediate risk'.  Positive CHADS2 values include History of HTN.  Negative CHADS2 values include Age > 42 years old.  The start date was 01/20/2009.  His last INR was 2.7 ratio.  Anticoagulation responsible Suzannah Bettes: [place].  INR POC: 1.9.  Cuvette Lot#: [id number].  Exp: 06/2011.    Anticoagulation Management Assessment/Plan:      The patient's current anticoagulation dose is Warfarin sodium 5 mg tabs: Use as directed by Anticoagulation Clinic.  The target INR is 2 - 3.  The next INR is due 08/24/2010.  Anticoagulation instructions were given to patient.  Results were reviewed/authorized by Bethena Midget, RN, BSN.  He was notified by Bethena Midget, RN, BSN.         Prior Anticoagulation Instructions: INR 2.0  Today, take Coumadin 2 tabs (10 mg).  Then continue taking Coumadin 1.5 tabs (7.5 mg) on all days except for Coumadin 2 tabs (10 mg) on Wednesdays. Return to clinic in 1 week.   Current Anticoagulation Instructions: INR 1.9 Today take 2 pills, then change dose to 1.5 pills everyday except 2 pills on Tuesdays, Thursdays and Saturdays. Recheck in one week.

## 2010-10-12 NOTE — Assessment & Plan Note (Signed)
Summary: taking of coumadin/saf   Referring Provider:  Charlton Haws Primary Provider:  Avva   History of Present Illness: [name] is seen today for F/U of CAD, atrial flutter s/p ablation June 2010  and HTN.  He had a treadmill with Dr. Graciela Husbands and reached a HR of 151 and was chronotropically competant. Marland Kitchen  He needs a F/U colonoscopy with Dr. Randa Evens, some dental extractions, and possibly a lumbar steroid injection by Dr. Wynetta Emery. I think it is safe to get him off coumadin at this point.  He is not having any palpitations.  His breathing is imporved and he still has mild LE edema from varicosites. His CAD is stable.  No angina with exertion  In regards to his coronary disease it has been stable.  He's had previous stenting of the AV circumflex which was patent by catheter in 2006.  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 Take One Tablet By Mouth Daily 2)  Atenolol 25 Mg Tabs (Atenolol) .... Take One Half Tablet By Mouth Daily 3)  Multivitamins   Tabs (Multiple Vitamin) .Marland Kitchen.. 1 Tab By Mouth Once Daily 4)  Fish Oil 1000 Mg Caps (Omega-3 Fatty Acids) .Marland Kitchen.. 1 Cap Once Daily 5)  Vitamin C 1000 Mg Tabs (Ascorbic Acid) .Marland Kitchen.. 1 Tab Once Daily 6)  Vitamin E 400 Unit Caps (Vitamin E) .... Take 1 Capsule Daily 7)  Glucosamine-Chondroitin   Caps  (Glucosamine-Chondroit-Vit C-Mn) .... Once Daily 8)  Acetaminophen 500 Mg  Caps (Acetaminophen) .... As Needed 9)  Hydrocodone-Homatropine 5-1.5 Mg/63ml Syrp (Hydrocodone-Homatropine) .... At Bedtime 10)  Mucinex Dm Maximum Strength 60-1200 Mg Xr12h-Tab (Dextromethorphan-Guaifenesin) .... 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  Lower extremity varicosities.  Past Surgical History: Last updated: 01/03/2009 stenting of the AV circumflex, ostium of the first obtuse marginal branch. Last intervention 2005 cath 2006 patent stent   ventral hernia repair with Dr. Abbey Chatters.   right olecranon nerve surgery with Dr. [name].   Family History: Last updated: 06/12/2009 heart disease: father, brother cancer: father (prostate)   Social History: Last updated: 06/12/2009 Married with children. Alcohol occasional Retired from Therapist, sports.  still farms part-time.   Patient states former smoker.  Started at age 39.  1 ppd.  quit 2006.  Review of Systems       Denies fever, malais, weight loss, blurry vision, decreased visual acuity, cough, sputum, SOB, hemoptysis, pleuritic pain, palpitaitons, heartburn, abdominal pain, melena, lower extremity edema, claudication, or rash.   Vital Signs:  Patient profile:  70 year old male Height:      73 inches Weight:      221 pounds BMI:     29.26 Pulse rate:   70 / minute BP sitting:   132 / 62  (left arm) Cuff size:   regular  Vitals Entered By: Hardin Negus, RMA (October 01, 2009 8:57 AM)  Physical  Exam  General:  Affect appropriate Healthy:  appears stated age HEENT: normal Neck supple with no adenopathy JVP normal no bruits no thyromegaly Lungs clear with no wheezing and good diaphragmatic motion Heart:  S1/S2 no murmur,rub, gallop or click PMI normal Abdomen: benighn, BS positve, no tenderness, no AAA no bruit.  No HSM or HJR Distal pulses intact with no bruits No edema Neuro non-focal Skin warm and dry    Impression & Recommendations:  Problem # 1:  ATRIAL FIBRILLATION (ICD-427.31)  S/P ablation for flutter off coumadin.  Continuje low dose BB The following medications were removed from the medication list:    Coumadin 5 Mg Tabs (Warfarin sodium) .Marland Kitchen... Take as directed by coumadin clinic. His updated medication list for this problem includes:    Aspirin Ec 325 Mg Tbec (Aspirin) .Marland Kitchen... Take one tablet by mouth daily    Atenolol 25 Mg Tabs (Atenolol) .Marland Kitchen... Take one half tablet by mouth daily  The following medications were removed from the medication list:    Coumadin 5 Mg Tabs (Warfarin sodium) .Marland Kitchen... Take as directed by coumadin clinic. His updated medication list for this problem includes:    Aspirin Ec 325 Mg Tbec (Aspirin) .Marland Kitchen... Take one tablet by mouth daily    Atenolol 25 Mg Tabs (Atenolol) .Marland Kitchen... Take one tablet by mouth daily  Problem # 2:  CAD; PRIOR PCI CX (ICD-414.00) Stable no angina.  Continue ASA The following medications were removed from the medication list:    Coumadin 5 Mg Tabs (Warfarin sodium) .Marland Kitchen... Take as directed by coumadin clinic. His updated medication list for this problem includes:    Aspirin Ec 325 Mg Tbec (Aspirin) .Marland Kitchen... Take one tablet by mouth daily    Atenolol 25 Mg Tabs (Atenolol) .Marland Kitchen... Take one tablet by mouth daily  Problem # 3:  EDEMA (ICD-782.3) Improved with low sodium diet.  Continue elevating legs at end of day  Patient Instructions: 1)  Your physician wants you to follow-up in: 6 months with Dr Charlton Haws .  You will  receive a reminder letter in the mail two months in advance. If you don't receive a letter, please call our office to schedule the follow-up appointment. 2)  Your physician recommends that you continue on your current medications as directed. Please refer to the Current Medication list given to you today. Prescriptions: ATENOLOL 25 MG TABS (ATENOLOL) Take one tablet by mouth daily  #30 x 6   Entered by:   Katina Dung, RN, BSN   Authorized by:   Colon Branch, MD, [facility]   Signed by:   Katina Dung, RN, BSN on 10/01/2009   Method used:   Electronically to        [facility] (retail)       [address]       [place], [place]  [address]       Ph: [phone]       Fax: [phone]   RxID:   [id number]

## 2010-10-12 NOTE — Progress Notes (Signed)
Summary: o.k. for injection - back  Phone Note Call from Patient Call back at Home Phone [phone] Call back at Work Phone [phone]   Caller: Patient Reason for Call: Talk to Nurse Summary of Call: pt need an o.k. for injection - back  from dr. Wallace Cullens cram.- Initial call taken by: Lorne Skeens,  May 24, 2010 12:03 PM  Follow-up for Phone Call        spoke with pt, he is needing injection in his back and needs ok from dr Eden Emms. will foward to dr Eden Emms for his review Deliah Goody, RN  May 24, 2010 1:37 PM   Additional Follow-up for Phone Call Additional follow up Details #1::        Ok for back injection Additional Follow-up by: Colon Branch, MD, [facility],  May 24, 2010 9:32 PM    Additional Follow-up for Phone Call Additional follow up Details #2::    pt aware, note faxed to dr cram Deliah Goody, RN  May 25, 2010 8:50 AM

## 2010-10-12 NOTE — Progress Notes (Signed)
Summary: pt has questions, extractions from dentist  Phone Note Call from Patient Call back at Work Phone [phone]   Caller: Patient Reason for Call: Talk to Nurse, Talk to Doctor Summary of Call: pt is going to have some dental extraction and he wants to make sure it's ok for him to take triazolam 0.25mg .  Initial call taken by: Omer Jack,  October 26, 2009 8:57 AM  Follow-up for Phone Call        spoke with pt, okay has been given for tooth extraction but pt wants dr Eden Emms okay to use halcion and the dentist wants to know if we need to alter any of his meds. will foward for dr Eden Emms to review. Deliah Goody, RN  October 26, 2009 3:02 PM   Additional Follow-up for Phone Call Additional follow up Details #1::        ok to take halcion if someone else drives No need to change any other meds Additional Follow-up by: Colon Branch, MD, [facility],  October 28, 2009 10:28 AM    0  Appended Document: pt has questions, extractions from dentist LEFT MESSAGE FOR DR Garvin Fila RE PT HAVING HALCION  OKAY PER DR Eden Emms.  Appended Document: pt has questions, extractions from dentist pt aware

## 2010-10-12 NOTE — Consult Note (Signed)
Summary: [facility]: New Pt Evaluation  [facility]: New Pt Evaluation   Imported By: Earl Many 05/31/2010 11:14:27  _____________________________________________________________________  External Attachment:    Type:   Image     Comment:   External Document

## 2010-10-12 NOTE — Procedures (Signed)
Summary: wch/lg   Current Medications (verified): 1)  Aspirin Ec 325 Mg Tbec (Aspirin) .... Take One Tablet By Mouth Daily 2)  Atenolol 50 Mg Tabs (Atenolol) .... Take One Tablet By Mouth Daily 3)  Multivitamins   Tabs (Multiple Vitamin) .Marland Kitchen.. 1 Tab By Mouth Once Daily 4)  Fish Oil 1200 Mg Caps (Omega-3 Fatty Acids) .... Once Daily 5)  Vitamin C 500 Mg Tabs (Ascorbic Acid) .... Once Daily 6)  Glucosamine-Chondroitin   Caps (Glucosamine-Chondroit-Vit C-Mn) .... Once Daily 7)  Acetaminophen 500 Mg  Caps (Acetaminophen) .... As Needed 8)  Nitrostat 0.4 Mg Subl (Nitroglycerin) .Marland Kitchen.. 1 Tablet Under Tongue At Onset of Chest Pain; You May Repeat Every 5 Minutes For Up To 3 Doses. 9)  Warfarin Sodium 5 Mg Tabs (Warfarin Sodium) .... Use As Directed By Anticoagulation Clinic  Allergies (verified): No Known Drug Allergies  PPM Specifications Following MD:  Sherryl Manges, MD     PPM Vendor:  [employer] Jude     PPM Model Number:  [id number]     PPM Serial Number:  [id number] PPM DOI:  07/02/2010     PPM Implanting MD:  Sherryl Manges, MD  Lead 1    Location: RA     DOI: 07/02/2010     Model #: [id number]     Serial #: [id number]     Status: active Lead 2    Location: RV     DOI: 07/02/2010     Model #: [id number]     Serial #: [id number]     Status: active  Magnet Response Rate:  BOL 100 ERI 85  Indications:  [name]   PPM Follow Up Battery Voltage:  3.13 V     Battery Est. Longevity:  6.1-7.2 yrs       PPM Device Measurements Atrium  Amplitude: 4.0 mV, Impedance: 440 ohms,  Right Ventricle  Amplitude: 12.0 mV, Impedance: 490 ohms, Threshold: 0.75 V at 0.4 msec  Episodes MS Episodes:  22     Percent Mode Switch:  >99%     Coumadin:  Yes Ventricular High Rate:  0     Atrial Pacing:  <1%     Ventricular Pacing:  >99%  Parameters Mode:  DDD     Lower Rate Limit:  60     Upper Rate Limit:  120 Paced AV Delay:  200     Sensed AV Delay:  200 Next Cardiology Appt Due:  08/04/2010 Tech Comments:  WOUND CHECK---STERI  STRIPS REMOVED.  NO REDNESS OR SWELLING AT SITE.  PT IN AF 99% OF TIME. + COUMADIN.  NORMAL DEVICE FUNCTION.  NO CHANGES MADE. ROV 08-04-10 W/SK. Vella Kohler  July 13, 2010 2:36 PM

## 2010-10-12 NOTE — Letter (Signed)
Summary: [facility]  [facility]   Imported By: Lennie Odor 08/06/2010 14:17:29  _____________________________________________________________________  External Attachment:    Type:   Image     Comment:   External Document

## 2010-10-12 NOTE — Assessment & Plan Note (Signed)
Summary: np6/ Recurrent atrial flutter with slow ventricular responce/mj   Visit Type:  new pt visit Referring Provider:  Charlton Haws Primary Provider:  Avva  CC:  chest pain.Marland Kitchen denies edema or SOB.  History of Present Illness: [name] is seen in followup of a pacemaker implanted for bradycardia in the setting of atrial arrhythmias done in early October. He called the office recently because of tachycardia palpitations. Interrogation of his device today demonstrates some inadequate sensing and tracking at his upper rate limit.  He also has coronary disease for which medical therapy will be pursued.laboratories were reviewed.     Significant issues with left knee.  Recent injection by Dr Thomasena Edis.  May need knee surgery but this will have to be delayed until flutter and CAD Rx.    Current Medications (verified): 1)  Aspirin Ec 325 Mg Tbec (Aspirin) .... Take One Tablet By Mouth Daily 2)  Atenolol 50 Mg Tabs (Atenolol) .... Take One Tablet By Mouth Daily 3)  Multivitamins   Tabs (Multiple Vitamin) .Marland Kitchen.. 1 Tab By Mouth Once Daily 4)  Fish Oil 1200 Mg Caps (Omega-3 Fatty Acids) .... Once Daily 5)  Vitamin C 500 Mg Tabs (Ascorbic Acid) .... Once Daily 6)  Glucosamine-Chondroitin   Caps (Glucosamine-Chondroit-Vit C-Mn) .... Once Daily 7)  Acetaminophen 500 Mg  Caps (Acetaminophen) .... As Needed 8)  Nitrostat 0.4 Mg Subl (Nitroglycerin) .Marland Kitchen.. 1 Tablet Under Tongue At Onset of Chest Pain; You May Repeat Every 5 Minutes For Up To 3 Doses. 9)  Warfarin Sodium 5 Mg Tabs (Warfarin Sodium) .... Use As Directed By Anticoagulation Clinic  Allergies (verified): No Known Drug Allergies  Vital Signs:  Patient profile:   70 year old male Height:      73 inches Weight:      219.25 pounds Pulse rate:   70 / minute Pulse rhythm:   irregular BP sitting:   128 / 72  (left arm) Cuff size:   large  Vitals Entered By: Danielle Rankin, CMA (August 04, 2010 2:25 PM)  Physical Exam  General:  Affect  appropriate Healthy:  appears stated age HEENT: normal Neck supple with no adenopathy JVP normal no bruits no thyromegaly Lungs clear with no wheezing and good diaphragmatic motion Heart:  S1/S2 no murmur,rub, gallop or click PMI normal Abdomen: benighn, BS positve, no tenderness, chest wall there is a well-healed pacemaker pocket no bruit.  No HSM or HJR Distal pulses intact with no bruits No edema Neuro non-focal Skin warm and dry    PPM Specifications Following MD:  Sherryl Manges, MD     PPM Vendor:  [employer]     PPM Model Number:  [id number]     PPM Serial Number:  [id number] PPM DOI:  07/02/2010     PPM Implanting MD:  Sherryl Manges, MD  Lead 1    Location: RA     DOI: 07/02/2010     Model #: [id number]     Serial #: [id number]     Status: active Lead 2    Location: RV     DOI: 07/02/2010     Model #: [id number]     Serial #: [id number]     Status: active  Magnet Response Rate:  BOL 100 ERI 85  Indications:  [name]   PPM Follow Up Battery Voltage:  2.96 V     Battery Est. Longevity:  4.8-5.5 yrs       PPM Device Measurements Atrium  Amplitude: 2.9 mV, Impedance: 440 ohms,  Right Ventricle  Amplitude: 12.0 mV, Impedance: 490 ohms, Threshold: 0.75 V at 0.40 msec  Episodes MS Episodes:  368     Percent Mode Switch:  94%     Coumadin:  Yes Ventricular High Rate:  0     Atrial Pacing:  <1%     Ventricular Pacing:  >99%  Parameters Mode:  DDD     Lower Rate Limit:  60     Upper Rate Limit:  120 Paced AV Delay:  200     Sensed AV Delay:  200 Next Cardiology Appt Due:  09/13/2010 Tech Comments:  PT IN AF 94% OF TIME SINCE 07-13-10.  +WARFARIN.  NORMAL DEVICE FUNCTION. CHANGED MODE FROM DDD TO DDIR AND CHANGED RA SENSITIVITY FROM 0.5 TO 0.5mV.  ROV IN JAN 2012 W/SK PT IS ENROLLED IN [place]. Marland Kitchen Vella Kohler  August 04, 2010 2:58 PM  Impression & Recommendations:  Problem # 1:  ATRIAL FIBRILLATION (ICD-427.31) the patient has persistence of atrial fibrillation following his pacemaker  implantation. There has been some episodes of an adequate atrial sensing resulting in inappropriate pacing at the upper rate limit. His device was reprogrammed today to the DDI mode.  His first therapeutic INR was one week ago or so. He is having checked next week. He is scheduled to see Dr. Velora Mediate in 2 weeks thereafter. If his INRs are therapeutic he should undergo cardioversion in the absence of antiarrhythmic therapy to see if we can determine first if he has symptomatic improvement with the restoration of sinus rhythm and second within he can maintain sinus rhythm. His updated medication list for this problem includes:    Aspirin Ec 325 Mg Tbec (Aspirin) .Marland Kitchen... Take one tablet by mouth daily    Atenolol 50 Mg Tabs (Atenolol) .Marland Kitchen... Take one tablet by mouth daily    Warfarin Sodium 5 Mg Tabs (Warfarin sodium) ..... 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 Take one tablet by mouth daily    Atenolol 50 Mg Tabs (Atenolol) .Marland Kitchen... Take one tablet by mouth daily    Nitrostat 0.4 Mg Subl (Nitroglycerin) .Marland Kitchen... 1 tablet under tongue at onset of chest pain; you may repeat every 5 minutes for up to 3 doses.    Warfarin Sodium 5 Mg Tabs (Warfarin sodium) ..... Use as directed by anticoagulation clinic  Problem # 3:  SMOKER (ICD-305.1) we discussed stopping cigarettes and using a Nicoderm patches  Patient Instructions: 1)  Your physician recommends that you continue on your current medications as directed. Please refer to the Current Medication list given to you today.

## 2010-10-12 NOTE — Consult Note (Signed)
Summary: [facility]   Imported By: Marylou Mccoy 02/02/2010 14:30:00  _____________________________________________________________________  External Attachment:    Type:   Image     Comment:   External Document

## 2010-10-12 NOTE — Cardiovascular Report (Signed)
Summary: Office Visit   Office Visit   Imported By: [name] Ovens 07/22/2010 14:32:01  _____________________________________________________________________  External Attachment:    Type:   Image     Comment:   External Document

## 2010-10-12 NOTE — Medication Information (Signed)
Summary: rov/tm  Anticoagulant Therapy  Managed by: Bethena Midget, RN, BSN Referring MD: Johney Frame MD, Fayrene Fearing PCP: Melene Plan MD: Eden Emms MD, Emonnie Cannady Indication 1: Atrial Fibrillation (ICD-427.31) Lab Used: [facility] Site: [address] INR POC 1.9 INR RANGE 2 - 3  Dietary changes: no    Health status changes: yes       Details: Toothache rt. side upper and lower.   Bleeding/hemorrhagic complications: no    Recent/future hospitalizations: no    Any changes in medication regimen? yes       Details: used Tylenol rapid release gelcaps and oral anesthetic liquid PRN for toothache.   Recent/future dental: no  Any missed doses?: no       Is patient compliant with meds? yes      Comments: Went to discuss toothache with Dr. Eden Emms for the pt. because he states he wants to do something about it soon because the pain is worsening.   Allergies: No Known Drug Allergies  Anticoagulation Management History:      The patient is taking warfarin and comes in today for a routine follow up visit.  Positive risk factors for bleeding include an age of 71 years or older.  The bleeding index is 'intermediate risk'.  Positive CHADS2 values include History of HTN.  Negative CHADS2 values include Age > 7 years old.  The start date was 01/20/2009.  His last INR was 2.7 ratio.  Anticoagulation responsible provider: Eden Emms MD, Theron Arista.  INR POC: 1.9.  Cuvette Lot#: [id number].  Exp: 10/2010.    Anticoagulation Management Assessment/Plan:      The patient's current anticoagulation dose is Coumadin 5 mg tabs: Take as directed by coumadin clinic..  The target INR is 2 - 3.  The next INR is due 09/15/2009.  Anticoagulation instructions were given to patient.  Results were reviewed/authorized by Bethena Midget, RN, BSN.         Prior Anticoagulation Instructions: INR 2.7 Continue 1.5 tablets everyday except 2 tablets on Wednesdays. Recheck in 2 weeks.  Each week eat 2 servings of Green leafy veggies.   Current  Anticoagulation Instructions: INR 1.9 Can discontinue coumadin per Dr. Eden Emms. Start 325mg s of Aspirin everyday. See Dr. Eden Emms with in the month. Appt. scheduled.  Prescriptions: COUMADIN 5 MG TABS (WARFARIN SODIUM) Take as directed by coumadin clinic.  #0 x 0   Entered by:   Bethena Midget, RN, BSN   Authorized by:   Colon Branch, MD, [facility]   Signed by:   Bethena Midget, RN, BSN on 09/15/2009   Method used:   Electronically to        [facility] (retail)       [address]       [place], [place]  [address]       Ph: [phone]       Fax: [phone]   RxID:   [id number] COUMADIN 5 MG TABS (WARFARIN SODIUM) Take as directed by coumadin clinic.  #60 x 3   Entered by:   Bethena Midget, RN, BSN   Authorized by:   Colon Branch, MD, [facility]   Signed by:   Bethena Midget, RN, BSN on 09/15/2009   Method used:   Electronically to        [facility] (retail)       [address]       [place], [place]  [address]       Ph: [phone]       Fax: [phone]   RxID:   [id number]

## 2010-10-12 NOTE — Medication Information (Signed)
Summary: rov/tm  Anticoagulant Therapy  Managed by: Lyna Poser, PharmD Referring MD: Eden Emms MD, Theron Arista PCP: Felipa Eth Supervising MD: Shirlee Latch MD, Dalton Indication 1: Atrial Flutter Lab Used: [facility] Site: [address] INR POC 2 INR RANGE 2 - 3  Dietary changes: no    Health status changes: no    Bleeding/hemorrhagic complications: no    Recent/future hospitalizations: no    Any changes in medication regimen? yes       Details: stopped garlic after last coumadin clinic visit  Recent/future dental: no  Any missed doses?: no       Is patient compliant with meds? yes       Allergies: No Known Drug Allergies  Anticoagulation Management History:      Positive risk factors for bleeding include an age of 71 years or older.  The bleeding index is 'intermediate risk'.  Positive CHADS2 values include History of HTN.  Negative CHADS2 values include Age > 71 years old.  The start date was 01/20/2009.  His last INR was 2.7 ratio.  Anticoagulation responsible provider: Shirlee Latch MD, Dalton.  INR POC: 2.  Exp: 08/2011.    Anticoagulation Management Assessment/Plan:      The patient's current anticoagulation dose is Warfarin sodium 5 mg tabs: Use as directed by Anticoagulation Clinic.  The target INR is 2 - 3.  The next INR is due 08/10/2010.  Anticoagulation instructions were given to patient.  Results were reviewed/authorized by Lyna Poser, PharmD.         Prior Anticoagulation Instructions: INR 1.3 Today take 10mg s then change dose to 7.5mg s daily except 10mg s on Wednesdays.  Recheck in one week.   Current Anticoagulation Instructions: INR 2 Continue taking 2 tablets on wednesday. And 1.5 tablets all other days. Recheck in 4 weeks.

## 2010-10-12 NOTE — Progress Notes (Signed)
Summary: extraction & dental work  Phone Note From Other Clinic   Caller: provider-Dr Counsellor Summary of Call: Pt needs dental work & extraction. is pt ok to do this? Please call to advise. [phone] Dr Garvin Fila email [email or web address] Initial call taken by: Edman Circle,  October 19, 2009 3:04 PM  Follow-up for Phone Call        ok to have dental extraction. Off coumadin and non-ischemic stress myovue in 2009 Follow-up by: Colon Branch, MD, [facility],  October 19, 2009 5:12 PM

## 2010-10-12 NOTE — Assessment & Plan Note (Signed)
Summary: rov for osa   Visit Type:  Follow-up Copy to:  Charlton Haws Primary Provider/Referring Provider:  Avva  CC:  1 year follow up. pt states he uses cpap 7/7 nights x 5-7 hrs a night. Pt states he needs a new cushion for his mask. Pt states he has no problems with his machine. Marland Kitchen  History of Present Illness: the pt comes in today for f/u of his known osa.  He is wearing cpap compliantly, and has no issues with mask fit or pressure.  He does need new cushion and supplies from dme.  He is not sleeping well due to chronic pain related to joints and spine.  He has lost 19 pounds since his last visit.  Current Medications (verified): 1)  Aspirin Ec 325 Mg Tbec (Aspirin) .... Take One Tablet By Mouth Daily 2)  Atenolol 25 Mg Tabs (Atenolol) .... 1/2 Tab By Mouth Once Daily 3)  Multivitamins   Tabs (Multiple Vitamin) .Marland Kitchen.. 1 Tab By Mouth Once Daily 4)  Fish Oil 1200 Mg Caps (Omega-3 Fatty Acids) .... Once Daily 5)  Vitamin C 500 Mg Tabs (Ascorbic Acid) .... Once Daily 6)  Vitamin E 400 Unit Caps (Vitamin E) .... Take 1 Capsule Daily 7)  Glucosamine-Chondroitin   Caps (Glucosamine-Chondroit-Vit C-Mn) .... Once Daily 8)  Acetaminophen 500 Mg  Caps (Acetaminophen) .... As Needed 9)  Nitrostat 0.4 Mg Subl (Nitroglycerin) .Marland Kitchen.. 1 Tablet Under Tongue At Onset of Chest Pain; You May Repeat Every 5 Minutes For Up To 3 Doses.  Allergies (verified): No Known Drug Allergies  Past History:  Past medical, surgical, family and social histories (including risk factors) reviewed, and no changes noted (except as noted below).  Past Medical History: Reviewed history from 01/03/2009 and no changes required. 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  Lower extremity varicosities.  Past Surgical History: Reviewed history from 01/03/2009 and no changes required. stenting of the AV circumflex, ostium of the first obtuse marginal branch. Last intervention 2005 cath 2006 patent stent   ventral hernia repair with Dr. Abbey Chatters.   right olecranon nerve surgery with Dr. [name].   Family History: Reviewed history from 06/12/2009 and no changes required. heart disease: father, brother cancer: father (prostate)   Social History: Reviewed history from 06/12/2009 and no changes required. Married with children. Alcohol occasional Retired from Therapist, sports.  still farms part-time.   Patient states former smoker.  Started at age 71.  1 ppd.  quit 2006.  Review of Systems       The patient complains of nasal congestion/difficulty breathing through nose, hand/feet swelling, and joint stiffness or pain.  The patient denies shortness of breath with activity, shortness of breath at rest, productive cough, non-productive cough, coughing up blood, chest pain, irregular heartbeats, acid heartburn, indigestion, loss of appetite, weight change, abdominal pain, difficulty swallowing, sore throat, tooth/dental problems, headaches, sneezing, itching, ear ache, anxiety, depression, rash, change in color of mucus, and fever.    Vital Signs:  Patient profile:   70 year old male Height:      73 inches Weight:      214.13 pounds BMI:     28.35 O2 Sat:      97 % on Room air Temp:     98.5 degrees F oral  Pulse rate:   82 / minute BP sitting:   120 / 78  (right arm) Cuff size:   regular  Vitals Entered By: Carver Fila (June 11, 2010 9:13 AM)  O2 Flow:  Room air CC: 1 year follow up. pt states he uses cpap 7/7 nights x 5-7 hrs a night. Pt states he needs a new cushion for his mask. Pt states he has no problems with his machine.  Comments meds and allergies updated Phone number  updated Carver Fila  June 11, 2010 9:13 AM    Physical Exam  General:  wd male in nad Nose:  no skin breakdown or pressure necrosis from cpap mask Extremities:  mild ankle/pedal edema, no cyanosis  Neurologic:  alert and oriented, moves all 4. does not appear sleepy.   Impression & Recommendations:  Problem # 1:  OBSTRUCTIVE SLEEP APNEA (ICD-327.23)  the pt is doing well with cpap, and cannot sleep without it.  He currently has sleep disruption related to chronic knee pain and spine disease.  I will send an order to his dme for new supplies, and have asked him to continue working on weight loss.  I will see him back in one year or sooner if problems arise.  Medications Added to Medication List This Visit: 1)  Atenolol 25 Mg Tabs (Atenolol) .... 1/2 tab by mouth once daily 2)  Fish Oil 1200 Mg Caps (Omega-3 fatty acids) .... Once daily 3)  Vitamin C 500 Mg Tabs (Ascorbic acid) .... Once daily  Other Orders: DME Referral (DME) Est. Patient Level III (95188)  Patient Instructions: 1)  continue with cpap 2)  will get order sent to [facility] for new cushion 3)  work on weight loss 4)  followup with me in one year.   Immunization History:  Influenza Immunization History:    Influenza:  historical (05/24/2010)

## 2010-10-12 NOTE — Progress Notes (Signed)
Summary: re heart rhythm  Phone Note Call from Patient   Caller: Patient [phone] or [phone] Reason for Call: Talk to Nurse Summary of Call: pt has questions re heart rhythm -called after hours yesterday, was told to take extra med and get back with [place] today in case we wanted to change his med Initial call taken by: Glynda Jaeger,  July 27, 2010 1:25 PM  Follow-up for Phone Call        spoke with pt, he has noticed his heart elevated late in the evening. it will range from 90's to 120. he was told last pm to take an extra atenolol and this am his pulse was good. he questioned if he should increase his meds. instructed to take extra 1/2 of atenolol in the evening if his heart rate is above 85. he will let me know if he is having to take extra meds on a freq basis Deliah Goody, RN  July 27, 2010 3:50 PM

## 2010-10-12 NOTE — Assessment & Plan Note (Signed)
Summary: f18m   Referring Provider:  Charlton Haws Primary Provider:  Avva  CC:  check up.  History of Present Illness: [name] is seen today for F/U of CAD, atrial flutter s/p ablation June 2010  and HTN.  He had a treadmill with Dr. Graciela Husbands and reached a HR of 151 and was chronotropically competant. . He had some blood work with Dr Felipa Eth and his TP was elevated at 8.8  I believe he is being w/u for myeloma.   He is not having any palpitations.  His breathing is imporved and he still has mild LE edema from varicosites. His CAD is stable.  No angina with exertion  In regards to his coronary disease it has been stable.  He's had previous stenting of the AV circumflex which was patent by catheter in 2006.  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 Take One Tablet By Mouth Daily 2)  Atenolol 25 Mg Tabs (Atenolol) .... 1/2 Tab By Mouth Once Daily 3)  Multivitamins   Tabs (Multiple Vitamin) .Marland Kitchen.. 1 Tab By Mouth Once Daily 4)  Fish Oil 1000 Mg Caps (Omega-3 Fatty Acids) .Marland Kitchen.. 1 Cap Once Daily 5)  Vitamin C 1000 Mg Tabs (Ascorbic Acid) .Marland Kitchen.. 1 Tab Once Daily 6)  Vitamin E 400 Unit Caps (Vitamin E) .... Take 1 Capsule Daily 7)  Glucosamine-Chondroitin   Caps (Glucosamine-Chondroit-Vit C-Mn) .... Once Daily 8)  Acetaminophen 500 Mg  Caps (Acetaminophen) .... As  Needed 9)  Nitrostat 0.4 Mg Subl (Nitroglycerin) .Marland Kitchen.. 1 Tablet Under Tongue At Onset of Chest Pain; You May Repeat Every 5 Minutes For Up To 3 Doses.  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  Lower extremity varicosities.  Past Surgical History: Last updated: 01/03/2009 stenting of the AV circumflex, ostium of the first obtuse marginal branch. Last intervention 2005 cath 2006 patent stent   ventral hernia repair with Dr. Abbey Chatters.   right olecranon nerve surgery with Dr. [name].   Family History: Last updated: 06/12/2009 heart disease: father, brother cancer: father (prostate)   Social History: Last updated: 06/12/2009 Married with children. Alcohol occasional Retired from Therapist, sports.  still farms part-time.   Patient states former smoker.  Started at age 61.  1 ppd.  quit 2006.  Risk Factors: Smoking Status: quit (06/12/2009)  Review of Systems       Denies fever, malais, weight loss, blurry vision, decreased visual acuity, cough, sputum, SOB, hemoptysis, pleuritic pain, palpitaitons, heartburn, abdominal pain, melena, lower extremity edema, claudication, or rash.   Vital  Signs:  Patient profile:   70 year old male Height:      73 inches Weight:      214 pounds BMI:     28.34 Pulse rate:   74 / minute Resp:     16 per minute BP sitting:   142 / 80  (left arm)  Vitals Entered By: Kem Parkinson (April 06, 2010 9:24 AM)  Physical Exam  General:  Affect appropriate Healthy:  appears stated age HEENT: normal Neck supple with no  adenopathy JVP normal no bruits no thyromegaly Lungs clear with no wheezing and good diaphragmatic motion Heart:  S1/S2 no murmur,rub, gallop or click PMI normal Abdomen: benighn, BS positve, no tenderness, no AAA no bruit.  No HSM or HJR Distal pulses intact with no bruits Mild  edema bilat. with varicosities Neuro non-focal Skin warm and dry    Impression & Recommendations:  Problem # 1:  ATRIAL FIBRILLATION (ICD-427.31) Maint NSR.  S/P flutter ablation off coumadin since 1/11 His updated medication list for this problem includes:    Aspirin Ec 325 Mg Tbec (Aspirin) .Marland Kitchen... Take one tablet by mouth daily    Atenolol 25 Mg Tabs (Atenolol) .Marland Kitchen... 1/2 tab by mouth once daily  Problem # 2:  CAD; PRIOR PCI CX (ICD-414.00) Stable no angina continue asa and bb His updated medication list for this problem includes:    Aspirin Ec 325 Mg Tbec (Aspirin) .Marland Kitchen... Take one tablet by mouth daily    Atenolol 25 Mg Tabs (Atenolol) .Marland Kitchen... 1/2 tab by mouth once daily    Nitrostat 0.4 Mg Subl (Nitroglycerin) .Marland Kitchen... 1 tablet under tongue at onset of chest pain; you may repeat every 5 minutes for up to 3 doses.  Problem # 3:  VENOUS INSUFFICIENCY (ICD-459.81) Variocosities with edema.  as needed diuretic and elevate legs at end of day.  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  Not clinically significant at this time.  F/U ECG Q 6 months His updated medication list for this problem includes:    Aspirin Ec 325 Mg Tbec (Aspirin) .Marland Kitchen... Take one tablet by mouth daily    Atenolol 25 Mg Tabs (Atenolol) .Marland Kitchen... 1/2 tab by mouth once daily    Nitrostat 0.4 Mg Subl (Nitroglycerin) .Marland Kitchen... 1 tablet under tongue at onset of chest pain; you may repeat every 5 minutes for up to 3 doses.  Patient Instructions: 1)  Your physician wants you to follow-up in:  6 months with Dr Eden Emms.  You will receive a reminder letter in the  mail two months in advance. If you don't receive a letter, please call our office to schedule the follow-up appointment.   EKG Report  Procedure date:  04/06/2010  Findings:      NSR PR 314 Otherwise normal

## 2010-10-12 NOTE — Medication Information (Signed)
Summary: ccr/mj  Anticoagulant Therapy  Managed by: Bethena Midget, RN, BSN Referring MD: Eden Emms MD, Theron Arista PCP: Melene Plan MD: Shirlee Latch MD, Dalton Indication 1: Atrial Flutter Lab Used: [facility] Site: [address] INR POC 1.3 INR RANGE 2 - 3  Dietary changes: no    Health status changes: no    Bleeding/hemorrhagic complications: no    Recent/future hospitalizations: yes       Details: Discharged from [facility] on 07/03/10 s/p dual pacermaker insertion  Any changes in medication regimen? no    Recent/future dental: no  Any missed doses?: no       Is patient compliant with meds? yes      Comments: Restarted coumadin on 07/02/10 at hospital with 7.5mg s daily.    Current Medications (verified): 1)  Aspirin Ec 325 Mg Tbec (Aspirin) .... Take One Tablet By Mouth Daily 2)  Atenolol 50 Mg Tabs (Atenolol) .... Take One Tablet By Mouth Daily 3)  Multivitamins   Tabs (Multiple Vitamin) .Marland Kitchen.. 1 Tab By Mouth Once Daily 4)  Fish Oil 1200 Mg Caps (Omega-3 Fatty Acids) .... Once Daily 5)  Vitamin C 500 Mg Tabs (Ascorbic Acid) .... Once Daily 6)  Glucosamine-Chondroitin   Caps (Glucosamine-Chondroit-Vit C-Mn) .... Once Daily 7)  Acetaminophen 500 Mg  Caps (Acetaminophen) .... As Needed 8)  Nitrostat 0.4 Mg Subl (Nitroglycerin) .Marland Kitchen.. 1 Tablet Under Tongue At Onset of Chest Pain; You May Repeat Every 5 Minutes For Up To 3 Doses. 9)  Warfarin Sodium 5 Mg Tabs (Warfarin Sodium) .... Use As Directed By Anticoagulation Clinic  Allergies: No Known Drug Allergies  Anticoagulation Management History:      The patient is taking warfarin and comes in today for a routine follow up visit.  Positive risk factors for bleeding include an age of 31 years or older.  The bleeding index is 'intermediate risk'.  Positive CHADS2 values include History of HTN.  Negative CHADS2 values include Age > 47 years old.  The start date was 01/20/2009.  His last INR was 2.7 ratio.  Anticoagulation responsible provider:  Shirlee Latch MD, Dalton.  INR POC: 1.3.  Cuvette Lot#: [id number].  Exp: 08/2011.    Anticoagulation Management Assessment/Plan:      The patient's current anticoagulation dose is Warfarin sodium 5 mg tabs: Use as directed by Anticoagulation Clinic.  The target INR is 2 - 3.  The next INR is due 07/13/2010.  Anticoagulation instructions were given to patient.  Results were reviewed/authorized by Bethena Midget, RN, BSN.  He was notified by Bethena Midget, RN, BSN.         Prior Anticoagulation Instructions: INR 1.9 Can discontinue coumadin per Dr. Eden Emms. Start 325mg s of Aspirin everyday. See Dr. Eden Emms with in the month. Appt. scheduled.   Current Anticoagulation Instructions: INR 1.3 Today take 10mg s then change dose to 7.5mg s daily except 10mg s on Wednesdays.  Recheck in one week.

## 2010-10-12 NOTE — Medication Information (Signed)
Summary: rov/mvw  Anticoagulant Therapy  Managed by: Reina Fuse, PharmD Referring MD: Eden Emms MD, Theron Arista PCP: Felipa Eth Supervising MD: Patty Sermons Indication 1: Atrial Flutter Lab Used: [facility] Site: [address] INR POC 2.0 INR RANGE 2 - 3  Dietary changes: no    Health status changes: no    Bleeding/hemorrhagic complications: no    Recent/future hospitalizations: no    Any changes in medication regimen? no    Recent/future dental: no  Any missed doses?: no       Is patient compliant with meds? yes      Comments: Pt to have upcoming cardioversion. Spoke with Stanton Kidney, pt needs 3 weeks therapeutic INRs. Rescheduled pt for next week and the following week to coincide with appt with Dr. Eden Emms.   Allergies: No Known Drug Allergies  Anticoagulation Management History:      The patient is taking warfarin and comes in today for a routine follow up visit.  Positive risk factors for bleeding include an age of 5 years or older.  The bleeding index is 'intermediate risk'.  Positive CHADS2 values include History of HTN.  Negative CHADS2 values include Age > 50 years old.  The start date was 01/20/2009.  His last INR was 2.7 ratio.  Anticoagulation responsible provider: [place].  INR POC: 2.0.  Cuvette Lot#: [id number].  Exp: 08/2011.    Anticoagulation Management Assessment/Plan:      The patient's current anticoagulation dose is Warfarin sodium 5 mg tabs: Use as directed by Anticoagulation Clinic.  The target INR is 2 - 3.  The next INR is due 08/17/2010.  Anticoagulation instructions were given to patient.  Results were reviewed/authorized by Reina Fuse, PharmD.  He was notified by Reina Fuse PharmD.         Prior Anticoagulation Instructions: INR 2 Continue taking 2 tablets on wednesday. And 1.5 tablets all other days. Recheck in 4 weeks.  Current Anticoagulation Instructions: INR 2.0  Today, take Coumadin 2 tabs (10 mg).  Then continue taking Coumadin 1.5 tabs (7.5 mg) on all days  except for Coumadin 2 tabs (10 mg) on Wednesdays. Return to clinic in 1 week.

## 2010-10-12 NOTE — Progress Notes (Signed)
Summary: calling re medication instructions  Phone Note Call from Patient   Caller: Patient  [phone] Reason for Call: Talk to Nurse Summary of Call: pt needs to talk to nurse re instructions on medication-pls call [phone] Initial call taken by: Glynda Jaeger,  April 12, 2010 9:03 AM  Follow-up for Phone Call        Phone Call Completed Deliah Goody, RN  April 12, 2010 9:14 AM     Prescriptions: ATENOLOL 25 MG TABS (ATENOLOL) 1/2 tab by mouth once daily  #30 x 12   Entered by:   Deliah Goody, RN   Authorized by:   Colon Branch, MD, [facility]   Signed by:   Deliah Goody, RN on 04/12/2010   Method used:   Electronically to        [facility] (retail)       [address]       [place], [place]  [address]       Ph: [phone]       Fax: [phone]   RxID:   [id number]

## 2010-10-12 NOTE — Progress Notes (Signed)
Summary: new rx  Phone Note Call from Patient Call back at Home Phone [phone]   Caller: Patient [phone] or [phone] Reason for Call: Talk to Nurse Summary of Call: pt wants new rx  for nitroglycerine [facility] pls call pt when/if called in Initial call taken by: Glynda Jaeger,  February 24, 2010 2:40 PM    New/Updated Medications: NITROSTAT 0.4 MG SUBL (NITROGLYCERIN) 1 tablet under tongue at onset of chest pain; you may repeat every 5 minutes for up to 3 doses. Prescriptions: NITROSTAT 0.4 MG SUBL (NITROGLYCERIN) 1 tablet under tongue at onset of chest pain; you may repeat every 5 minutes for up to 3 doses.  #25 x 12   Entered by:   Kem Parkinson   Authorized by:   Colon Branch, MD, [facility]   Signed by:   Kem Parkinson on 02/24/2010   Method used:   Electronically to        [facility] (retail)       [address]       [place], [place]  [address]       Ph: [phone]       Fax: [phone]   RxID:   [id number]

## 2010-10-12 NOTE — Progress Notes (Signed)
Summary: Cardiology Phone Note  Phone Note Call from Patient Call back at Home Phone [phone]   Caller: Patient Summary of Call: Pt called 07/26/10, 7:40 p.m. Chart reviewed - pt with hx of atrial flutter on Coumadin, CAD, s/p ppm 07/03/10. He checks his blood pressure and pulse routinely at night, and happened to notice his pulse was 117. BP was 127/78. He is completely asymptomatic - denies CP, SOB, nausea, dizziness/lightheadedness, or palpitations. He did have an extra bowel movement this evening but no diarrhea, melena, or hematochezia. Instructed patient to: 1) take an additional 1/2 tablet of atenolol tonight for improved rate control (he takes 50mg  tablets), 2) recheck HR/BP in AM and if HR still >100, call Dr. Therisa Doyne office, and 3) if HR remains persistently elevated and he becomes symptomatic with CP, SOB, palps, or any other symptoms concerning to him, proceed to hospital. Mr. Moorman expressed understanding in plan, agrees. Initial call taken by: Ronie Spies PA-C     Appended Document: Cardiology Phone Note Needs F/U with [facility] to decide on ablation or [facility]  Appended Document: Cardiology Phone Note PT AWARE HAS F/U WITH DR Graciela Husbands 08/04/10 ALREADY SCHEDULED./CY

## 2010-10-12 NOTE — Progress Notes (Signed)
Summary: [facility]  [facility]   Imported By: Earl Many 04/26/2010 14:03:48  _____________________________________________________________________  External Attachment:    Type:   Image     Comment:   External Document

## 2010-10-12 NOTE — Miscellaneous (Signed)
Summary: Device preload  Clinical Lists Changes  Observations: Added new observation of PPM INDICATN: Brady (07/06/2010 8:36) Added new observation of MAGNET RTE: BOL 100 ERI 85 (07/06/2010 8:36) Added new observation of PPMLEADSTAT2: active (07/06/2010 8:36) Added new observation of PPMLEADSER2: [id number] (07/06/2010 8:36) Added new observation of PPMLEADMOD2: 2088TC (07/06/2010 8:36) Added new observation of PPMLEADLOC2: RV (07/06/2010 8:36) Added new observation of PPMLEADSTAT1: active (07/06/2010 8:36) Added new observation of PPMLEADSER1: [id number] (07/06/2010 8:36) Added new observation of PPMLEADMOD1: 2088TC (07/06/2010 8:36) Added new observation of PPMLEADLOC1: RA (07/06/2010 8:36) Added new observation of PPM IMP MD: Sherryl Manges, MD (07/06/2010 8:36) Added new observation of PPMLEADDOI2: 07/02/2010 (07/06/2010 8:36) Added new observation of PPMLEADDOI1: 07/02/2010 (07/06/2010 8:36) Added new observation of PPM DOI: 07/02/2010 (07/06/2010 8:36) Added new observation of PPM SERL#: [id number]  (07/06/2010 8:36) Added new observation of PPM MODL#: [id number]  (07/06/2010 3:24) Added new observation of PACEMAKERMFG: [employer] Jude  (07/06/2010 8:36) Added new observation of PACEMAKER MD: Sherryl Manges, MD  (07/06/2010 8:36)      PPM Specifications Following MD:  Sherryl Manges, MD     PPM Vendor:  [employer] Jude     PPM Model Number:  [id number]     PPM Serial Number:  [id number] PPM DOI:  07/02/2010     PPM Implanting MD:  Sherryl Manges, MD  Lead 1    Location: RA     DOI: 07/02/2010     Model #: [id number]     Serial #: [id number]     Status: active Lead 2    Location: RV     DOI: 07/02/2010     Model #: [id number]     Serial #: [id number]     Status: active  Magnet Response Rate:  BOL 100 ERI 85  Indications:  [name]

## 2010-10-12 NOTE — Medication Information (Signed)
Summary: Coumadin Clinic  Anticoagulant Therapy  Managed by: Inactive Referring MD: Allred MD, Fayrene Fearing PCP: Felipa Eth Supervising MD: Eden Emms MD, Peter Indication 1: Atrial Fibrillation (ICD-427.31) Lab Used: [facility] Site: [address] INR RANGE 2 - 3          Comments: Per ov note on 10/01/09 pt is off coumadin.  Allergies: No Known Drug Allergies  Anticoagulation Management History:      Positive risk factors for bleeding include an age of 71 years or older.  The bleeding index is 'intermediate risk'.  Positive CHADS2 values include History of HTN.  Negative CHADS2 values include Age > 71 years old.  The start date was 01/20/2009.  His last INR was 2.7 ratio.  Anticoagulation responsible provider: Eden Emms MD, Theron Arista.  Exp: 10/2010.    Anticoagulation Management Assessment/Plan:      The target INR is 2 - 3.  The next INR is due 09/15/2009.  Anticoagulation instructions were given to patient.  Results were reviewed/authorized by Inactive.         Prior Anticoagulation Instructions: INR 1.9 Can discontinue coumadin per Dr. Eden Emms. Start 325mg s of Aspirin everyday. See Dr. Eden Emms with in the month. Appt. scheduled.

## 2010-10-14 NOTE — Medication Information (Signed)
Summary: rov/ewj  Anticoagulant Therapy  Managed by: Weston Brass, PharmD Referring MD: Eden Emms MD, Theron Arista PCP: Felipa Eth Supervising MD: Gala Romney MD, Reuel Boom Indication 1: Atrial Flutter Lab Used: [facility] Site: [facility] INR POC 3.0 INR RANGE 2 - 3  Dietary changes: no    Health status changes: no    Bleeding/hemorrhagic complications: no    Recent/future hospitalizations: yes       Details: pending ablation next week  Any changes in medication regimen? no    Recent/future dental: no  Any missed doses?: no       Is patient compliant with meds? yes       Allergies: No Known Drug Allergies  Anticoagulation Management History:      The patient is taking warfarin and comes in today for a routine follow up visit.  Positive risk factors for bleeding include an age of 71 years or older.  The bleeding index is 'intermediate risk'.  Positive CHADS2 values include History of HTN.  Negative CHADS2 values include Age > 30 years old.  The start date was 01/20/2009.  His last INR was 3.4.  Anticoagulation responsible provider: Bensimhon MD, Reuel Boom.  INR POC: 3.0.  Cuvette Lot#: [id number].  Exp: 10/2011.    Anticoagulation Management Assessment/Plan:      The patient's current anticoagulation dose is Warfarin sodium 5 mg tabs: Use as directed by Anticoagulation Clinic.  The target INR is 2 - 3.  The next INR is due 10/05/2010.  Anticoagulation instructions were given to patient.  Results were reviewed/authorized by Weston Brass, PharmD.  He was notified by Linward Headland, PharmD candidate.         Prior Anticoagulation Instructions: INR 3.0  Continue on same dosage 1.5 tablets daily except 2 tablets on Tuesdays, Thursdays, and Saturdays.  Recheck in 1 week.  Pending catheter ablation on 09/27/10.    Current Anticoagulation Instructions: INR 3.0 (INR goal: 2-3)  Continue on same dosage of 1 and 1/2 tablets daily except 2 tablets on Tuesdays, Thursdays, and Saturdays.    Recheck INR 1 week  after ablation

## 2010-10-14 NOTE — Medication Information (Signed)
Summary: rov/sl  Anticoagulant Therapy  Managed by: Cheri Fowler D Referring MD: Eden Emms MD, Theron Arista PCP: Felipa Eth Supervising MD: Gala Romney MD, Reuel Boom Indication 1: Atrial Flutter Lab Used: [facility] Site: [address] INR POC 2.4 INR RANGE 2 - 3  Dietary changes: no    Health status changes: no    Bleeding/hemorrhagic complications: no    Recent/future hospitalizations: no    Any changes in medication regimen? no    Recent/future dental: no  Any missed doses?: no       Is patient compliant with meds? yes       Allergies: No Known Drug Allergies  Anticoagulation Management History:      The patient is taking warfarin and comes in today for a routine follow up visit.  Positive risk factors for bleeding include an age of 38 years or older.  The bleeding index is 'intermediate risk'.  Positive CHADS2 values include History of HTN.  Negative CHADS2 values include Age > 62 years old.  The start date was 01/20/2009.  His last INR was 2.7 ratio and today's INR is 2.4.  Anticoagulation responsible provider: Bensimhon MD, Reuel Boom.  INR POC: 2.4.  Cuvette Lot#: [id number].  Exp: 09/2011.    Anticoagulation Management Assessment/Plan:      The patient's current anticoagulation dose is Warfarin sodium 5 mg tabs: Use as directed by Anticoagulation Clinic.  The target INR is 2 - 3.  The next INR is due 09/21/2010.  Anticoagulation instructions were given to patient.  Results were reviewed/authorized by Cheri Fowler D.         Prior Anticoagulation Instructions: INR 1.9 Today take 2 pills, then change dose to 1.5 pills everyday except 2 pills on Tuesdays, Thursdays and Saturdays. Recheck in one week.   Current Anticoagulation Instructions: INR 2.4 The patient is to continue with the same dose of coumadin.  This dosage includes:  2 tablet (10mg ) on Tue, Thu, Sat. 1.2 tablet (7.5mg ) on Sun, Mon, Wed, Fri  Recheck INR in 4 weeks.   Appended Document: rov/sl Pending [facility]

## 2010-10-14 NOTE — Medication Information (Signed)
Summary: per md /ccr/saf  Anticoagulant Therapy  Managed by: Louann Sjogren, PharmD Referring MD: Eden Emms MD, Theron Arista PCP: Felipa Eth Supervising MD: Patty Sermons, MD Indication 1: Atrial Flutter Lab Used: [facility] Site: [address] INR POC 3.4 INR RANGE 2 - 3  Dietary changes: no    Health status changes: yes       Details: Recently quit smoking 1 month ago.  Went from 1 ppd to none.  Bleeding/hemorrhagic complications: no    Recent/future hospitalizations: no    Any changes in medication regimen? yes       Details: Tramadol  Recent/future dental: no  Any missed doses?: no       Is patient compliant with meds? yes       Allergies: No Known Drug Allergies  Anticoagulation Management History:      The patient is taking warfarin and comes in today for a routine follow up visit.  Positive risk factors for bleeding include an age of 16 years or older.  The bleeding index is 'intermediate risk'.  Positive CHADS2 values include History of HTN.  Negative CHADS2 values include Age > 80 years old.  The start date was 01/20/2009.  His last INR was 2.4 and today's INR is 3.4.  Anticoagulation responsible provider: Patty Sermons, MD.  INR POC: 3.4.  Cuvette Lot#: [id number].  Exp: 09/2011.    Anticoagulation Management Assessment/Plan:      The patient's current anticoagulation dose is Warfarin sodium 5 mg tabs: Use as directed by Anticoagulation Clinic.  The target INR is 2 - 3.  The next INR is due 09/08/2010.  Anticoagulation instructions were given to patient.  Results were reviewed/authorized by Louann Sjogren, PharmD.         Prior Anticoagulation Instructions: INR 2.4 The patient is to continue with the same dose of coumadin.  This dosage includes:  2 tablet (10mg ) on Tue, Thu, Sat. 1.2 tablet (7.5mg ) on Sun, Mon, Wed, Fri  Recheck INR in 4 weeks.   Current Anticoagulation Instructions: INR 3.4  Take 1 1/2 tablets today (12/20).  Then take 1 1/2 tablets daily except take 2  tablets on Tuesdays, Thursdays, and Saturdays.   Return to clinic in 1 week on Wednesday, December 28th at 9:15AM.

## 2010-10-14 NOTE — Progress Notes (Signed)
Summary: Calling to schedule time for procedure  Phone Note Call from Patient Call back at Work Phone [phone]   Caller: Patient Summary of Call: Caling to schedule time for procedure Initial call taken by: Judie Grieve,  September 17, 2010 8:42 AM  Follow-up for Phone Call        spoke w/pt and schedule labs for 1/10 @945  and adv pt that he will be ok for jury duty in February.  Follow-up by: Claris Gladden RN,  September 17, 2010 10:03 AM

## 2010-10-14 NOTE — Letter (Signed)
Summary: ELectrophysiology/Ablation Procedure Instructions  [facility], Main Office  [address]   [place], [place] [address]   Phone: [phone]  Fax: [phone]     Electrophysiology/Ablation Procedure Instructions    You are scheduled for a(n) Flutter ablation on September 27, 2010 at 2:35 pm with Dr. Graciela Husbands.  1.  Please come to the Short Stay Center at [facility] at 12:35 pm on the day of your procedure.  2.  Come prepared to stay overnight.   Please bring your insurance cards and a list of your medications.  3.  Come to the [place] office on A DATE TO BE DETERMINED  for lab work.  The lab at [facility] is open from 8:30 AM to 1:30 PM and 2:30 PM to 5:00 PM.  The lab at [facility] is open from 7:30 AM to 5:30 PM.  You do not have to be fasting. PLEASE CALL ME AT [phone] TO SCHEDULE A LAB TIME THE WEEK OF [place]. Salem Senate, RN  4.  You may have a clear liquid breakfast until 8:30 the morning of your procedure.  Clear liquids include water, broth, coffee, tea (no sugar), cranberry/apple/grape juice and popsicles from clear juices.   5.   Take  your medications with a small amount of water.    * Occasionally, EP studies and ablations can become lengthy.  Please make your family aware of this before your procedure starts.  Average time ranges from 2-8 hours for EP studies/ablations.  Your physician will locate your family after the procedure with the results.  * If you have any questions after you get home, please call the office at [phone]. Claris Gladden, RN

## 2010-10-14 NOTE — Assessment & Plan Note (Signed)
Summary: f23m/CMF   Referring Provider:  Charlton Haws Primary Provider:  Avva  CC:  ROV; Chest discomfort x several days ago; C/O (L) knee pain.  History of Present Illness: [name] is seen today for F/U  He saw Dr Graciela Husbands a few weeks ago and was in flutter.   His INR's have not been Rx.   He has had a previous flutter ablatoin but was found to be in recurrent flutter.  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  Patient also had severe bradycardia with pauses.  SK/TS decided to place a pacer and F/U consdieration of [facility] or repeat ablation.  CAD to be Rx medically initially.  He is under a lot of stress and started smoking again.  I counseled him for less than 10 minutes on cessation especially in light of his distal circ stenosis.  Denies current angina.   He also is being actively w/u for myeloma with recent BM biipsy.  He has a cancer clinci F/U with Dr Arline Asp  Significant issues with left knee.  Recent injection by Dr Thomasena Edis.  May need knee surgery but this will have to be delayed until flutter Rx.  Recommended he call Dr Thomasena Edis and get oxycodone or Tramadol for pain.    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 Take One Tablet By Mouth Daily 2)  Atenolol 50 Mg Tabs (Atenolol) .... Take One Tablet By Mouth Daily 3)  Multivitamins   Tabs (Multiple Vitamin) .Marland Kitchen.. 1 Tab By Mouth Once Daily 4)  Fish Oil 1200 Mg Caps (Omega-3 Fatty Acids) .... Once Daily 5)  Vitamin C 500 Mg Tabs (Ascorbic Acid) .... Once Daily 6)  Glucosamine-Chondroitin   Caps (Glucosamine-Chondroit-Vit C-Mn) .... Once Daily 7)  Acetaminophen 500 Mg  Caps (Acetaminophen) .... As Needed 8)  Nitrostat 0.4 Mg Subl (Nitroglycerin) .Marland Kitchen.. 1 Tablet Under Tongue At Onset of Chest Pain; You May Repeat Every 5 Minutes For Up To 3 Doses. 9)  Warfarin Sodium 5 Mg Tabs (Warfarin Sodium) .... 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  Lower extremity varicosities.  Past Surgical History: Last updated: 01/03/2009 stenting of the AV circumflex, ostium of the first obtuse marginal branch. Last intervention 2005  cath 2006 patent stent   ventral hernia repair with Dr. Abbey Chatters.   right olecranon nerve surgery with Dr. [name].   Family History: Last updated: 06/12/2009 heart disease: father, brother cancer: father (prostate)   Social History: Last updated: 06/12/2009 Married with children. Alcohol occasional Retired from Therapist, sports.  still farms part-time.   Patient states former smoker.  Started at age 26.  1 ppd.  quit 2006.  Review of Systems       Denies fever, malais, weight  loss, blurry vision, decreased visual acuity, cough, sputum, SOB, hemoptysis, pleuritic pain, palpitaitons, heartburn, abdominal pain, melena, lower extremity edema, claudication, or rash.   Vital Signs:  Patient profile:   70 year old male Height:      72 inches Weight:      222 pounds BMI:     30.22 Pulse rate:   66 / minute BP sitting:   140 / 88  (left arm) Cuff size:   regular  Vitals Entered By: Stanton Kidney, EMT-P (August 24, 2010 9:20 AM)  Physical Exam  General:  Affect appropriate Healthy:  appears stated age HEENT: normal Neck supple with no adenopathy JVP normal no bruits no thyromegaly Lungs clear with no wheezing and good diaphragmatic motion Heart:  S1/S2 no murmur,rub, gallop or click PMI normal Abdomen: benighn, BS positve, no tenderness, no AAA no bruit.  No HSM or HJR Distal pulses intact with no bruits No edema Neuro non-focal Skin warm and dry    PPM Specifications Following MD:  Sherryl Manges, MD     PPM Vendor:  [employer]     PPM Model Number:  [id number]     PPM Serial Number:  [id number] PPM DOI:  07/02/2010     PPM Implanting MD:  Sherryl Manges, MD  Lead 1    Location: [facility]     DOI: 07/02/2010     Model #: [id number]     Serial #: [id number]     Status: active Lead 2    Location: [facility]     DOI: 07/02/2010     Model #: [id number]     Serial #: [id number]     Status: active  Magnet Response Rate:  BOL 100 ERI 85  Indications:  Brady   Episodes Coumadin:  Yes  Parameters Mode:  DDD     Lower Rate Limit:  60     Upper Rate Limit:  120 Paced AV Delay:  200     Sensed AV Delay:  200  Impression & Recommendations:  Problem # 1:  SMOKER (ICD-305.1) Quit 2 months now Congratulated him on this  Problem # 2:  MULTIPLE  MYELOMA (ICD-203.00) F/U Dr Arline Asp  Protein levels have come down according to patient  Problem # 3:  ATRIAL FIBRILLATION (ICD-427.31) In flutter today.  I suppose Dr Graciela Husbands wants to try [facility] first and if recurrant consider repeat ablation  INR  subRx recently.  F/U clinic and plan [facility] in 3 weeks His updated medication list for this problem includes:    Aspirin Ec 325 Mg Tbec (Aspirin) .Marland Kitchen... Take one tablet by mouth daily    Atenolol 50 Mg Tabs (Atenolol) .Marland Kitchen... Take one tablet by mouth daily    Warfarin Sodium 5 Mg Tabs (Warfarin sodium) ..... Use as directed by anticoagulation clinic  Problem # 4:  SICK SINUS/ TACHY-BRADY SYNDROME (ICD-427.81) Pacer working well His updated medication list for this problem includes:    Aspirin Ec 325 Mg Tbec (Aspirin) .Marland Kitchen... Take one tablet  by mouth daily    Atenolol 50 Mg Tabs (Atenolol) .Marland Kitchen... Take one tablet by mouth daily    Nitrostat 0.4 Mg Subl (Nitroglycerin) .Marland Kitchen... 1 tablet under tongue at onset of chest pain; you may repeat every 5 minutes for up to 3 doses.    Warfarin Sodium 5 Mg Tabs (Warfarin sodium) ..... Use as directed by anticoagulation clinic  Problem # 5:  CAD; PRIOR PCI CX (ICD-414.00) Recent cath.  Medical Rx per TS His updated medication list for this problem includes:    Aspirin Ec 325 Mg Tbec (Aspirin) .Marland Kitchen... Take one tablet by mouth daily    Atenolol 50 Mg Tabs (Atenolol) .Marland Kitchen... Take one tablet by mouth daily    Nitrostat 0.4 Mg Subl (Nitroglycerin) .Marland Kitchen... 1 tablet under tongue at onset of chest pain; you may repeat every 5 minutes for up to 3 doses.    Warfarin Sodium 5 Mg Tabs (Warfarin sodium) ..... Use as directed by anticoagulation clinic  Problem # 6:  HYPERTENSION (ICD-401.9) Well controlled His updated medication list for this problem includes:    Aspirin Ec 325 Mg Tbec (Aspirin) .Marland Kitchen... Take one tablet by mouth daily    Atenolol 50 Mg Tabs (Atenolol) .Marland Kitchen... Take one tablet by mouth daily  Other Orders: EKG w/ Interpretation (93000)  Patient Instructions: 1)  Your physician recommends that you schedule a follow-up appointment in: 3 weeks with Dr Eden Emms 2)  Your physician recommends that you continue on your current medications as directed. Please refer to the Current  Medication list given to you today. 3)  See the coumadin clinic in 1 week (Tuesday)   EKG Report  Procedure date:  08/24/2010  Findings:      Atrial flutter 66 V pacing

## 2010-10-14 NOTE — Progress Notes (Addendum)
Summary: pt has question re appts  Phone Note Call from Patient   Caller: Patient [phone] or [phone] Reason for Call: Talk to Nurse Summary of Call: pt was scheduled for ablation and due to blood level being high it was not done, however dr Graciela Husbands did a cardioversion, pt calling to see when he needs to see klein and he has appt on 1-27 with nishan does he need to keep that appt? Initial call taken by: Glynda Jaeger,  September 28, 2010 3:57 PM  Follow-up for Phone Call        spoke w/pt regarding time  for procedure. will either be 2/9 or 2/13 around 10. adv pt will schedule ablations as soon as I can and let him know. he understands it may be tomorrow. also he is not sure if he is to keep Dr. Fabio Bering appt at this time.  Will clarify w/Dr. Graciela Husbands but think that appt was f/u after the ablation.  Follow-up by: Claris Gladden RN,  September 28, 2010 4:36 PM  Additional Follow-up for Phone Call Additional follow up Details #1::        lmfcb. Claris Gladden, RN, BSN 09/29/10 1818  pt rtn call-pls call [phone] Glynda Jaeger  September 30, 2010 10:42 AM Additional Follow-up by: Glynda Jaeger,  September 30, 2010 10:42 AM    Additional Follow-up for Phone Call Additional follow up Details #2::    spoke w/pt and he is agreeable to 2/13 surg at 1030 am and see pa scott on 2/9 at 930 am with labs afterwards. understands will probably hold coumadin after appointment to be sure he is therapeutic for surg. Follow-up by: Claris Gladden RN,  September 30, 2010 11:18 AM

## 2010-10-14 NOTE — Medication Information (Signed)
Summary: rov  Anticoagulant Therapy  Managed by: Cloyde Reams, RN, BSN Referring MD: Eden Emms MD, Theron Arista PCP: Felipa Eth Supervising MD: Jens Som MD, Arlys John Indication 1: Atrial Flutter Lab Used: [facility] Site: [address] INR POC 3.0 INR RANGE 2 - 3  Dietary changes: no    Health status changes: no    Bleeding/hemorrhagic complications: no    Recent/future hospitalizations: no    Any changes in medication regimen? no    Recent/future dental: no  Any missed doses?: no       Is patient compliant with meds? yes       Allergies: No Known Drug Allergies  Anticoagulation Management History:      The patient is taking warfarin and comes in today for a routine follow up visit.  Positive risk factors for bleeding include an age of 71 years or older.  The bleeding index is 'intermediate risk'.  Positive CHADS2 values include History of HTN.  Negative CHADS2 values include Age > 16 years old.  The start date was 01/20/2009.  His last INR was 3.4.  Anticoagulation responsible provider: Jens Som MD, Arlys John.  INR POC: 3.0.  Cuvette Lot#: [id number].  Exp: 10/2011.    Anticoagulation Management Assessment/Plan:      The patient's current anticoagulation dose is Warfarin sodium 5 mg tabs: Use as directed by Anticoagulation Clinic.  The target INR is 2 - 3.  The next INR is due 09/21/2010.  Anticoagulation instructions were given to patient.  Results were reviewed/authorized by Cloyde Reams, RN, BSN.  He was notified by Cloyde Reams RN.         Prior Anticoagulation Instructions: INR 3.2 Today take 1 pill then resume 1.5 pills everyday except 2 pills  on Tuesdays, Thursdays and Saturdays. Recheck in one week.   Current Anticoagulation Instructions: INR 3.0  Continue on same dosage 1.5 tablets daily except 2 tablets on Tuesdays, Thursdays, and Saturdays.  Recheck in 1 week.  Pending catheter ablation on 09/27/10.

## 2010-10-14 NOTE — Medication Information (Signed)
Summary: rov/sp  Anticoagulant Therapy  Managed by: Weston Brass, PharmD Referring MD: Eden Emms MD, Theron Arista PCP: Felipa Eth Supervising MD: Tenny Craw MD, Gunnar Fusi Indication 1: Atrial Flutter Lab Used: [facility] Site: [facility] INR POC 2.2 INR RANGE 2 - 3  Dietary changes: no    Health status changes: no    Bleeding/hemorrhagic complications: no    Recent/future hospitalizations: yes       Details: had ablation planned for last week but INR was too high.    Any changes in medication regimen? no    Recent/future dental: no  Any missed doses?: yes     Details: Held on 1/16 per Dr. Odessa Fleming instructions.  Took 5mg  on 1/17 then resumed normal dose.   Is patient compliant with meds? yes       Allergies: No Known Drug Allergies  Anticoagulation Management History:      The patient is taking warfarin and comes in today for a routine follow up visit.  Positive risk factors for bleeding include an age of 35 years or older.  The bleeding index is 'intermediate risk'.  Positive CHADS2 values include History of HTN.  Negative CHADS2 values include Age > 72 years old.  The start date was 01/20/2009.  His last INR was 3.2 ratio.  Anticoagulation responsible provider: Tenny Craw MD, Gunnar Fusi.  INR POC: 2.2.  Cuvette Lot#: [id number].  Exp: 09/2011.    Anticoagulation Management Assessment/Plan:      The patient's current anticoagulation dose is Warfarin sodium 5 mg tabs: Use as directed by Anticoagulation Clinic.  The target INR is 2 - 3.  The next INR is due 10/11/2010.  Anticoagulation instructions were given to patient.  Results were reviewed/authorized by Weston Brass, PharmD.  He was notified by Weston Brass PharmD.         Prior Anticoagulation Instructions: INR 3.0 (INR goal: 2-3)  Continue on same dosage of 1 and 1/2 tablets daily except 2 tablets on Tuesdays, Thursdays, and Saturdays.    Recheck INR 1 week after ablation   Current Anticoagulation Instructions: INR 2.2  Continue same dose of 1 1/2 tablets  every day except 2 tablets on Tuesday, Thursday and Saturday.  Eat an extra serving of greens every week.

## 2010-10-14 NOTE — Medication Information (Signed)
Summary: rov/tm  Anticoagulant Therapy  Managed by: Bethena Midget, RN, BSN Referring MD: Eden Emms MD, Theron Arista PCP: Felipa Eth Supervising MD: Jens Som MD, Arlys John Indication 1: Atrial Flutter Lab Used: [facility] Site: [address] INR POC 3.2 INR RANGE 2 - 3  Dietary changes: no    Health status changes: no    Bleeding/hemorrhagic complications: no    Recent/future hospitalizations: no    Any changes in medication regimen? no    Recent/future dental: no  Any missed doses?: yes     Details: Missed a dose on Saturday.   Is patient compliant with meds? yes      Comments: Pending DCCV  Allergies: No Known Drug Allergies  Anticoagulation Management History:      The patient is taking warfarin and comes in today for a routine follow up visit.  Positive risk factors for bleeding include an age of 71 years or older.  The bleeding index is 'intermediate risk'.  Positive CHADS2 values include History of HTN.  Negative CHADS2 values include Age > 106 years old.  The start date was 01/20/2009.  His last INR was 3.4.  Anticoagulation responsible provider: Jens Som MD, Arlys John.  INR POC: 3.2.  Cuvette Lot#: [id number].  Exp: 09/2011.    Anticoagulation Management Assessment/Plan:      The patient's current anticoagulation dose is Warfarin sodium 5 mg tabs: Use as directed by Anticoagulation Clinic.  The target INR is 2 - 3.  The next INR is due 09/14/2010.  Anticoagulation instructions were given to patient.  Results were reviewed/authorized by Bethena Midget, RN, BSN.  He was notified by Bethena Midget, RN, BSN.         Prior Anticoagulation Instructions: INR 3.4  Take 1 1/2 tablets today (12/20).  Then take 1 1/2 tablets daily except take 2 tablets on Tuesdays, Thursdays, and Saturdays.   Return to clinic in 1 week on Wednesday, December 28th at 9:15AM.  Current Anticoagulation Instructions: INR 3.2 Today take 1 pill then resume 1.5 pills everyday except 2 pills  on Tuesdays, Thursdays and  Saturdays. Recheck in one week.

## 2010-10-14 NOTE — Cardiovascular Report (Signed)
Summary: Office Visit   Office Visit   Imported By: [name] Ovens 09/15/2010 12:18:59  _____________________________________________________________________  External Attachment:    Type:   Image     Comment:   External Document

## 2010-10-14 NOTE — Letter (Signed)
Summary: ELectrophysiology/Ablation Procedure Instructions  [facility], Main Office  [address]   [place], [place] [address]   Phone: [phone]  Fax: [phone]     Electrophysiology/Ablation Procedure Instructions    You are scheduled for a(n) Flutter Ablation on October 25, 2010 at 1030 with Dr. Graciela Husbands.    1.  Please come to the Short Stay Center at [facility] at 8:30 am on the day of your procedure.  2.  Come prepared to stay overnight.   Please bring your insurance cards and a list of your medications.  3.  Come to the [place] office on October 21, 2010 at 10:00 am for lab work.  You will see Tereso Newcomer, PA at 9:30 am the same day.  You do not have to be fasting.  4.  Do not have anything to eat or drink after midnight the night before your procedure.  5.  Take Coumadin as instructed by Kennon Rounds, Dr. Graciela Husbands, and Lorin Picket.   All of your remaining morning medications may be taken with a small amount of water.    * Occasionally, EP studies and ablations can become lengthy.  Please make your family aware of this before your procedure starts.  Average time ranges from 2-8 hours for EP studies/ablations.  Your physician will locate your family after the procedure with the results.  * If you have any questions after you get home, please call the office at [phone]. Claris Gladden, RN

## 2010-10-14 NOTE — Assessment & Plan Note (Signed)
Summary: pacer check/st. jude   Visit Type:  st. jude Referring Provider:  Charlton Haws Primary Provider:  Avva  CC:  chest pain-occ.  History of Present Illness: Mr [name] is seen in followou p for recurrent flutter.   His INR's have now been therapeutic for 4 weeks    He has had a previous flutter ablatoin     He has a history of proximal dominant circ stent and under went cath by TS 10/11  for his SSCP.  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  Patient also had severe bradycardia with pauses.  We  decided to place a pacer andpursue r repeat ablation    CAD to be Rx medically initially.  He is under a lot of stress and started smoking again.  I counseled him for less than 10 minutes on cessation especially in light of his distal circ stenosis.  Denies current angina.    He also is being actively w/u for myeloma with recent BM biipsy.  He has a cancer clinci F/U with Dr Arline Asp  Significant issues with left knee.  Recent injection by Dr Thomasena Edis.  May need knee surgery but this will have to be delayed until flutter Rx.  Recommended he call Dr Thomasena Edis and get oxycodone or Tramadol for pain.       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 Take One Tablet By Mouth Daily 2)  Atenolol 50 Mg Tabs  (Atenolol) .... Take One Tablet By Mouth Daily 3)  Multivitamins   Tabs (Multiple Vitamin) .Marland Kitchen.. 1 Tab By Mouth Once Daily 4)  Fish Oil 1200 Mg Caps (Omega-3 Fatty Acids) .... Once Daily 5)  Vitamin C 500 Mg Tabs (Ascorbic Acid) .... Once Daily 6)  Glucosamine-Chondroitin   Caps (Glucosamine-Chondroit-Vit C-Mn) .... Once Daily 7)  Acetaminophen 500 Mg  Caps (Acetaminophen) .... As Needed 8)  Nitrostat 0.4 Mg Subl (Nitroglycerin) .Marland Kitchen.. 1 Tablet Under Tongue At Onset of Chest Pain; You May Repeat Every 5 Minutes For Up To 3 Doses. 9)  Warfarin Sodium 5 Mg Tabs (Warfarin Sodium) .... 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  Lower extremity varicosities.  Past Surgical History: Last updated: 01/03/2009 stenting of the AV circumflex, ostium of the first obtuse marginal branch. Last intervention 2005 cath 2006 patent stent   ventral hernia repair with Dr. Abbey Chatters.   right olecranon nerve surgery with Dr. [name].   Family History: Last updated: 06/12/2009  heart disease: father, brother cancer: father (prostate)   Social History: Last updated: 06/12/2009 Married with children. Alcohol occasional Retired from Therapist, sports.  still farms part-time.   Patient states former smoker.  Started at age 25.  1 ppd.  quit 2006.  Risk Factors: Smoking Status: quit (06/12/2009)  Vital Signs:  Patient profile:   70 year old male Height:      72 inches Weight:      224.13 pounds BMI:     30.51 Pulse rate:   60 / minute BP  sitting:   118 / 64  (left arm) Cuff size:   regular  Vitals Entered By: Caralee Ates CMA (September 14, 2010 1:58 PM)  Physical Exam  General:  The patient was alert and oriented in no acute distress. HEENT Normal.  Neck veins were flat, carotids were brisk.  Lungs were clear.  Device pocket weill healed Heart sounds were regular without murmurs or gallops.  Abdomen was soft with active bowel sounds. There is no clubbing cyanosis or edema. Skin Warm and dry    PPM Specifications Following MD:  Sherryl Manges, MD     PPM Vendor:  St Jude     PPM Model Number:  [id number]     PPM Serial Number:  [id number] PPM DOI:  07/02/2010     PPM Implanting MD:  Sherryl Manges, MD  Lead 1    Location: RA     DOI: 07/02/2010     Model #: [id number]     Serial #: [id number]     Status: active Lead 2    Location: RV     DOI: 07/02/2010     Model #: [id number]     Serial #: [id number]     Status: active  Magnet Response Rate:  BOL 100 ERI 85  Indications:  [name]   PPM Follow Up Battery Voltage:  2.96 V     Battery Est. Longevity:  5.0-6.0 yrs       PPM Device Measurements Atrium  Amplitude: 2.1 mV, Impedance: 460 ohms,  Right Ventricle  Amplitude: 12.0 mV, Impedance: 560 ohms, Threshold: 1.0 V at 0.4 msec  Episodes MS Episodes:  112     Percent Mode Switch:  >99%     Coumadin:  Yes Ventricular High Rate:  0     Atrial Pacing:  7.2%     Ventricular Pacing:  >99%  Parameters Mode:  DDIR     Lower Rate Limit:  60     Upper Rate Limit:  120 Paced AV Delay:  200     Sensed AV Delay:  200 Next Cardiology Appt Due:  06/13/2011 Tech Comments:  PT IN AF 99% OF TIME. + WARFARIN.  NORMAL DEVICE FUNCTION.  CHANGED RV OUTPUT FROM 3.5 TO 2.5 V.  ROV IN OCT 2012 W/SK. Vella Kohler   Impression & Recommendations:  Problem # 1:  ATRIAL FLUTTER (ICD-427.32) aflutter recurret n.  will undertake repeat RFCA of AFl to decrease the risk of recurrence and allowus to get him off anticoagulation   risk and benefits  reivewed His updated medication list for this problem includes:    Aspirin Ec 325 Mg Tbec (Aspirin) .Marland Kitchen... Take one tablet by mouth daily    Atenolol 50 Mg Tabs (Atenolol) .Marland Kitchen... Take one tablet by mouth daily    Warfarin Sodium 5 Mg Tabs (Warfarin sodium) ..... Use as directed by anticoagulation clinic  Problem # 2:  SICK SINUS/ TACHY-BRADY SYNDROME (ICD-427.81) stable pst pacing His  updated medication list for this problem includes:    Aspirin Ec 325 Mg Tbec (Aspirin) .Marland Kitchen... Take one tablet by mouth daily    Atenolol 50 Mg Tabs (Atenolol) .Marland Kitchen... Take one tablet by mouth daily    Nitrostat 0.4 Mg Subl (Nitroglycerin) .Marland Kitchen... 1 tablet under tongue at onset of chest pain; you may repeat every 5 minutes for up to 3 doses.    Warfarin Sodium 5 Mg Tabs (Warfarin sodium) ..... Use as directed by anticoagulation clinic  Problem # 3:  PACEMAKER, PERMANENT (ICD-V45.01) Device parameters and data were reviewed and reprorammed to [facility] longevity

## 2010-10-20 ENCOUNTER — Encounter (INDEPENDENT_AMBULATORY_CARE_PROVIDER_SITE_OTHER): Payer: MEDICARE

## 2010-10-20 ENCOUNTER — Encounter: Payer: Self-pay | Admitting: Cardiology

## 2010-10-20 ENCOUNTER — Encounter: Payer: Self-pay | Admitting: Physician Assistant

## 2010-10-20 ENCOUNTER — Encounter: Payer: Self-pay | Admitting: Internal Medicine

## 2010-10-20 ENCOUNTER — Other Ambulatory Visit (HOSPITAL_COMMUNITY): Payer: Self-pay | Admitting: Oncology

## 2010-10-20 ENCOUNTER — Other Ambulatory Visit (INDEPENDENT_AMBULATORY_CARE_PROVIDER_SITE_OTHER): Payer: MEDICARE

## 2010-10-20 ENCOUNTER — Encounter: Payer: Self-pay | Admitting: Cardiovascular Disease

## 2010-10-20 ENCOUNTER — Encounter (HOSPITAL_BASED_OUTPATIENT_CLINIC_OR_DEPARTMENT_OTHER): Payer: MEDICARE | Admitting: Oncology

## 2010-10-20 ENCOUNTER — Other Ambulatory Visit: Payer: Self-pay

## 2010-10-20 ENCOUNTER — Encounter (INDEPENDENT_AMBULATORY_CARE_PROVIDER_SITE_OTHER): Payer: MEDICARE | Admitting: Physician Assistant

## 2010-10-20 DIAGNOSIS — I2510 Atherosclerotic heart disease of native coronary artery without angina pectoris: Secondary | ICD-10-CM

## 2010-10-20 DIAGNOSIS — I4892 Unspecified atrial flutter: Secondary | ICD-10-CM

## 2010-10-20 DIAGNOSIS — I495 Sick sinus syndrome: Secondary | ICD-10-CM

## 2010-10-20 DIAGNOSIS — G4730 Sleep apnea, unspecified: Secondary | ICD-10-CM

## 2010-10-20 DIAGNOSIS — I10 Essential (primary) hypertension: Secondary | ICD-10-CM

## 2010-10-20 DIAGNOSIS — D472 Monoclonal gammopathy: Secondary | ICD-10-CM

## 2010-10-20 DIAGNOSIS — I4891 Unspecified atrial fibrillation: Secondary | ICD-10-CM

## 2010-10-20 DIAGNOSIS — N400 Enlarged prostate without lower urinary tract symptoms: Secondary | ICD-10-CM

## 2010-10-20 DIAGNOSIS — Z7901 Long term (current) use of anticoagulants: Secondary | ICD-10-CM

## 2010-10-20 DIAGNOSIS — R0989 Other specified symptoms and signs involving the circulatory and respiratory systems: Secondary | ICD-10-CM

## 2010-10-20 LAB — PROTIME-INR: Prothrombin Time: 34.4 s — ABNORMAL HIGH (ref 10.2–12.4)

## 2010-10-20 LAB — IGG, IGA, IGM
IgA: 107 mg/dL (ref 68–378)
IgG (Immunoglobin G), Serum: 417 mg/dL — ABNORMAL LOW (ref 694–1618)
IgM, Serum: 4220 mg/dL — ABNORMAL HIGH (ref 60–263)

## 2010-10-20 LAB — CBC WITH DIFFERENTIAL/PLATELET
BASO%: 0.8 % (ref 0.0–2.0)
EOS%: 7.2 % — ABNORMAL HIGH (ref 0.0–7.0)
HCT: 43.3 % (ref 38.4–49.9)
LYMPH%: 25.8 % (ref 14.0–49.0)
MCH: 32.1 pg (ref 27.2–33.4)
MCHC: 34.6 g/dL (ref 32.0–36.0)
MCV: 92.5 fL (ref 79.3–98.0)
MONO%: 19 % — ABNORMAL HIGH (ref 0.0–14.0)
NEUT%: 47.2 % (ref 39.0–75.0)
Platelets: 186 10*3/uL (ref 140–400)
RBC: 4.68 10*6/uL (ref 4.20–5.82)
WBC: 5.2 10*3/uL (ref 4.0–10.3)
nRBC: 0 % (ref 0–0)

## 2010-10-20 LAB — BASIC METABOLIC PANEL
BUN: 11 mg/dL (ref 6–23)
Calcium: 9.8 mg/dL (ref 8.4–10.5)
Chloride: 97 mEq/L (ref 96–112)
Creatinine, Ser: 0.7 mg/dL (ref 0.4–1.5)

## 2010-10-20 LAB — COMPREHENSIVE METABOLIC PANEL
AST: 43 U/L — ABNORMAL HIGH (ref 0–37)
Albumin: 4 g/dL (ref 3.5–5.2)
Alkaline Phosphatase: 59 U/L (ref 39–117)
BUN: 11 mg/dL (ref 6–23)
Calcium: 9.3 mg/dL (ref 8.4–10.5)
Chloride: 94 mEq/L — ABNORMAL LOW (ref 96–112)
Creatinine, Ser: 0.66 mg/dL (ref 0.40–1.50)
Glucose, Bld: 73 mg/dL (ref 70–99)
Potassium: 4.6 mEq/L (ref 3.5–5.3)

## 2010-10-20 LAB — CONVERTED CEMR LAB: POC INR: 3.4

## 2010-10-20 LAB — APTT: aPTT: 49.9 s — ABNORMAL HIGH (ref 21.7–28.8)

## 2010-10-20 NOTE — Medication Information (Signed)
Summary: rov/sp  Anticoagulant Therapy  Managed by: Cloyde Reams, RN, BSN Referring MD: Eden Emms MD, Theron Arista PCP: Felipa Eth Supervising MD: Adalin Vanderploeg Indication 1: Atrial Flutter Lab Used: [facility] Site: [address] INR POC 2.2 INR RANGE 2 - 3  Dietary changes: no    Health status changes: no    Bleeding/hemorrhagic complications: no    Recent/future hospitalizations: no    Any changes in medication regimen? no    Recent/future dental: no  Any missed doses?: no       Is patient compliant with meds? yes      Comments: Ablation scheduled for 10/25/10  Allergies: No Known Drug Allergies  Anticoagulation Management History:      The patient is taking warfarin and comes in today for a routine follow up visit.  Positive risk factors for bleeding include an age of 71 years or older.  The bleeding index is 'intermediate risk'.  Positive CHADS2 values include History of HTN.  Negative CHADS2 values include Age > 18 years old.  The start date was 01/20/2009.  His last INR was 3.2 ratio.  Anticoagulation responsible provider: Tavin Vernet.  INR POC: 2.2.  Cuvette Lot#: [id number].  Exp: 09/2011.    Anticoagulation Management Assessment/Plan:      The patient's current anticoagulation dose is Warfarin sodium 5 mg tabs: Use as directed by Anticoagulation Clinic.  The target INR is 2 - 3.  The next INR is due 10/18/2010.  Anticoagulation instructions were given to patient.  Results were reviewed/authorized by Cloyde Reams, RN, BSN.  He was notified by Bethena Midget, RN, BSN.         Prior Anticoagulation Instructions: INR 2.2  Continue same dose of 1 1/2 tablets every day except 2 tablets on Tuesday, Thursday and Saturday.  Eat an extra serving of greens every week.   Current Anticoagulation Instructions: INR 2.2  Continue on same dosage of Coumadin 1.5 tablets daily except 2 tablets on Tuesdays, Thursdays, and Saturdays.  Recheck in 1 week.

## 2010-10-21 ENCOUNTER — Encounter: Payer: Self-pay | Admitting: Physician Assistant

## 2010-10-21 ENCOUNTER — Other Ambulatory Visit: Payer: Self-pay

## 2010-10-21 ENCOUNTER — Telehealth: Payer: Self-pay | Admitting: Internal Medicine

## 2010-10-21 LAB — CONVERTED CEMR LAB
Basophils Absolute: 0.1 10*3/uL (ref 0.0–0.1)
Basophils Relative: 1 % (ref 0–1)
MCHC: 34.8 g/dL (ref 30.0–36.0)
Monocytes Absolute: 1 10*3/uL (ref 0.1–1.0)
Neutro Abs: 2.7 10*3/uL (ref 1.7–7.7)
Neutrophils Relative %: 49 % (ref 43–77)
Platelets: 213 10*3/uL (ref 150–400)
RDW: 13.3 % (ref 11.5–15.5)

## 2010-10-25 ENCOUNTER — Ambulatory Visit (HOSPITAL_COMMUNITY)
Admission: RE | Admit: 2010-10-25 | Discharge: 2010-10-26 | DRG: 251 | Disposition: A | Discharge status: Home / Self Care | Payer: Medicare Other | Source: Ambulatory Visit | Attending: Internal Medicine | Admitting: Internal Medicine

## 2010-10-25 DIAGNOSIS — I2510 Atherosclerotic heart disease of native coronary artery without angina pectoris: Secondary | ICD-10-CM | POA: Diagnosis present

## 2010-10-25 DIAGNOSIS — Z01810 Encounter for preprocedural cardiovascular examination: Secondary | ICD-10-CM | POA: Insufficient documentation

## 2010-10-25 DIAGNOSIS — Z9861 Coronary angioplasty status: Secondary | ICD-10-CM

## 2010-10-25 DIAGNOSIS — I8390 Asymptomatic varicose veins of unspecified lower extremity: Secondary | ICD-10-CM | POA: Insufficient documentation

## 2010-10-25 DIAGNOSIS — Z950 Presence of cardiac pacemaker: Secondary | ICD-10-CM

## 2010-10-25 DIAGNOSIS — G4733 Obstructive sleep apnea (adult) (pediatric): Secondary | ICD-10-CM | POA: Diagnosis present

## 2010-10-25 DIAGNOSIS — M479 Spondylosis, unspecified: Secondary | ICD-10-CM | POA: Diagnosis present

## 2010-10-25 DIAGNOSIS — I872 Venous insufficiency (chronic) (peripheral): Secondary | ICD-10-CM | POA: Diagnosis present

## 2010-10-25 DIAGNOSIS — I4892 Unspecified atrial flutter: Secondary | ICD-10-CM

## 2010-10-25 DIAGNOSIS — Z7982 Long term (current) use of aspirin: Secondary | ICD-10-CM

## 2010-10-25 DIAGNOSIS — C9000 Multiple myeloma not having achieved remission: Secondary | ICD-10-CM | POA: Diagnosis present

## 2010-10-25 LAB — BASIC METABOLIC PANEL
BUN: 7 mg/dL (ref 6–23)
Calcium: 9.9 mg/dL (ref 8.4–10.5)
Creatinine, Ser: 0.6 mg/dL (ref 0.4–1.5)
GFR calc non Af Amer: >60 mL/min (ref >60)
Glucose, Bld: 101 mg/dL — ABNORMAL HIGH (ref 70–99)

## 2010-10-25 LAB — CBC
MCH: 32.9 pg (ref 26.0–34.0)
MCHC: 35.9 g/dL (ref 30.0–36.0)
Platelets: 181 10*3/uL (ref 150–400)

## 2010-10-25 LAB — PROTIME-INR
INR: 1.43 (ref 0.00–1.49)
Prothrombin Time: 17.6 seconds — ABNORMAL HIGH (ref 11.6–15.2)

## 2010-10-26 DIAGNOSIS — I4891 Unspecified atrial fibrillation: Secondary | ICD-10-CM

## 2010-10-28 NOTE — Medication Information (Signed)
Summary: Coumadin Clinic  Anticoagulant Therapy  Managed by: Weston Brass, PharmD Referring MD: Eden Emms MD, Theron Arista PCP: Felipa Eth Supervising MD: Shirlee Latch MD, Franca Stakes Indication 1: Atrial Flutter Lab Used: [facility] Site: [facility] INR POC 3.4 INR RANGE 2 - 3  Dietary changes: no    Health status changes: no    Bleeding/hemorrhagic complications: no    Recent/future hospitalizations: no    Any changes in medication regimen? no    Recent/future dental: no  Any missed doses?: no       Is patient compliant with meds? yes       Allergies: No Known Drug Allergies  Anticoagulation Management History:      The patient is taking warfarin and comes in today for a routine follow up visit.  Positive risk factors for bleeding include an age of 71 years or older.  The bleeding index is 'intermediate risk'.  Positive CHADS2 values include History of HTN.  Negative CHADS2 values include Age > 74 years old.  The start date was 01/20/2009.  His last INR was 3.2 ratio.  Anticoagulation responsible provider: Shirlee Latch MD, Akima Slaugh.  INR POC: 3.4.  Cuvette Lot#: [id number].  Exp: 09/2011.    Anticoagulation Management Assessment/Plan:      The patient's current anticoagulation dose is Warfarin sodium 5 mg tabs: Use as directed by Anticoagulation Clinic.  The target INR is 2 - 3.  The next INR is due 10/18/2010.  Anticoagulation instructions were given to patient.  Results were reviewed/authorized by Weston Brass, PharmD.  He was notified by Margot Chimes PharmD Candidate.         Prior Anticoagulation Instructions: INR 2.2  Continue on same dosage of Coumadin 1.5 tablets daily except 2 tablets on Tuesdays, Thursdays, and Saturdays.  Recheck in 1 week.    Current Anticoagulation Instructions: INR 3.4

## 2010-10-28 NOTE — Progress Notes (Signed)
Summary: procedure questions and medications  Phone Note Call from Patient Call back at [phone]   Caller: Patient Summary of Call: Pt calling with question about his procedure on 10/25/10 Initial call taken by: Judie Grieve,  October 21, 2010 10:15 AM  Follow-up for Phone Call        pt wanted to know if he should take his normal medications the morning of his procedure - he was instructed to take medications as ordered.  Also pt has questions about taking extra Vit D but his multivitamin that he takes daily already contains it.  Pt will discuss OTC supplements in more detail at his next office visit per his request. Follow-up by: Charolotte Capuchin, RN,  October 21, 2010 11:05 AM     Appended Document: procedure questions and medications hold coumadin 4 daays prior to procedure thnx  Appended Document: procedure questions and medications pts Coumadin was d/ced per Tereso Newcomer, PA

## 2010-10-28 NOTE — Assessment & Plan Note (Addendum)
Summary: Clearance for Flutter Ablation on 2/15  /rm   Visit Type:  Pre-op Evaluation Referring Provider:  Charlton Haws Primary Provider:  Avva  CC:  occ.chest pain and sob.  History of Present Illness: Primary Cardiologist:  Dr. Charlton Haws Primary Electrophysiologist:  Dr. Sherryl Manges  [name] is a 70 yo male with a h/o CAD, s/p CFX stent in the past, atrial flutter status post prior radiofrequency catheter ablation and implantation of a pacemaker secondary to significant bradycardia in October 2011 who has had documented recurrent atrial flutter.  Cardiac catheterization 07/03/10 demonstrated 30-40% LAD stenosis, 50% diagonal one; OM1 50%, AV circumflex and 30%, distal circumflex 70-80% (medical therapy); mid RCA 40%.  Echocardiogram October 2011 demonstrated an ejection fraction 60-65%; moderate LVH; mild LAE.  He presented January 17 for ablation.  However, his INR was too high and he was pace terminated to sinus rhythm with his device.  He returns today for preprocedure evaluation prior to his rescheduled ablation on 10/25/10.  Overall he is doing well.  He does have an occasional chest pain.  He denies exertional chest symptoms.  He denies arm or jaw symptoms.  He denies associated shortness of breath.  He thinks it may be related to acid reflux.  He denies any worsening symptoms since his cardiac catheterization in October.  He does have shortness of breath at times.  He really describes NYHA class II symptoms.  He denies orthopnea, PND or pedal edema.  He denies syncope.  He denies palpitations.  Current Medications (verified): 1)  Aspirin Ec 325 Mg Tbec (Aspirin) .... Take One Tablet By Mouth Daily 2)  Atenolol 50 Mg Tabs (Atenolol) .... Take One Tablet By Mouth Daily 3)  Multivitamins   Tabs (Multiple Vitamin) .Marland Kitchen.. 1 Tab By Mouth Once Daily 4)  Fish Oil 1200 Mg Caps (Omega-3 Fatty Acids) .... Once Daily 5)  Vitamin C 500 Mg Tabs (Ascorbic Acid) .... Once Daily 6)   Glucosamine-Chondroitin   Caps (Glucosamine-Chondroit-Vit C-Mn) .... Once Daily 7)  Acetaminophen 500 Mg  Caps (Acetaminophen) .... As Needed 8)  Nitrostat 0.4 Mg Subl (Nitroglycerin) .Marland Kitchen.. 1 Tablet Under Tongue At Onset of Chest Pain; You May Repeat Every 5 Minutes For Up To 3 Doses. 9)  Warfarin Sodium 5 Mg Tabs (Warfarin Sodium) .... Use As Directed By Anticoagulation Clinic  Allergies (verified): No Known Drug Allergies  Past History:  Past Medical History: HEART BLOCK (ICD-426.9)   a. s/p pacer 06/2010 HYPERTENSION (ICD-401.9) CAD (ICD-414.00)   a. s/p CFX stent in past   b. 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  Lower extremity varicosities. VENOUS INSUFFICIENCY (ICD-459.81) CUBITAL TUNNEL SYNDROME (ICD-354.2)  Past Surgical History: stenting of the AV circumflex, ostium of the first obtuse marginal branch. Last intervention 2005 cath 2006 patent stent ventral hernia repair with Dr. Abbey Chatters.  right olecranon nerve surgery with Dr. [name].   Family History: Reviewed history from 06/12/2009 and no changes required. heart disease: father, brother cancer: father (prostate)   Social History: Reviewed history from 06/12/2009 and no changes required. Married with children. Alcohol occasional Retired from Therapist, sports.  still farms part-time.   Patient states former smoker.  Started at age 46.  1 ppd.  quit 2006.  Review of Systems       As  per  the HPI.  All other systems reviewed and negative.   Vital Signs:  Patient profile:   70 year old male Height:      72 inches Weight:      223 pounds Pulse rate:   100 / minute BP sitting:   152 / 82  (right arm)  Vitals Entered By: Jacquelin Hawking, CMA (October 20, 2010 9:38 AM)  Physical  Exam  General:  Well nourished, well developed in no acute distress HEENT: Normal Neck: No JVD Cardiac:  Normal S1, S2; RRR; no murmur Lungs:  Clear to auscultation bilaterally, no wheezing, rhonchi or rales Abd: Soft, nontender, no hepatomegaly Ext: No edema Vascular: No carotid  bruits; Femoral arteries without bruits Skin: Warm and dry MSK:  No deformities Lymph: No adenopathy Endocrine:  No thyromegaly Neuro: CNs 2-12 intact; nonfocal    EKG  Procedure date:  10/20/2010  Findings:      AV paced with a ventricular rate of 60  PPM Specifications Following MD:  Sherryl Manges, MD     PPM Vendor:  [employer] Jude     PPM Model Number:  [id number]     PPM Serial Number:  [id number] PPM DOI:  07/02/2010     PPM Implanting MD:  Sherryl Manges, MD  Lead 1    Location: RA     DOI: 07/02/2010     Model #: [id number]     Serial #: [id number]     Status: active Lead 2    Location: RV     DOI: 07/02/2010     Model #: [id number]     Serial #: [id number]     Status: active  Magnet Response Rate:  BOL 100 ERI 85  Indications:  Brady   Episodes Coumadin:  Yes  Parameters Mode:  DDIR     Lower Rate Limit:  60     Upper Rate Limit:  120 Paced AV Delay:  200     Sensed AV Delay:  200  Impression & Recommendations:  Problem # 1:  ATRIAL FLUTTER (ICD-427.32) He is maintaining normal sinus rhythm.  I have been in touch with Dr. Graciela Husbands.  The plan is to stop his Coumadin today if he is in sinus rhythm in preparation for his flutter ablation next week.  Therefore, I have explained to the patient today that he should start holding his Coumadin.  He will have his preprocedure labs done today.  He already knows the risks and benefits of flutter ablation.  I discussed those with him again today.  Orders: EKG w/ Interpretation (93000)  Problem # 2:  CAD; PRIOR PCI CX (ICD-414.00) Continue aspirin and beta blocker.  He does not appear to be having any unstable symptoms at this time.  Problem # 3:  HYPERTENSION  (ICD-401.9) He has not taken any of his medications yet today.  I've explained to him that he should probably try to take his atenolol mornings.  Problem # 4:  MULTIPLE  MYELOMA (ICD-203.00) He will followup with Dr. Arline Asp.  Patient Instructions: 1)  Your physician recommends that you return for lab work in: Today BMP, 427.31, cbc, 427.31, ptt 427.31, pt 427.31 2)  Your physician has recommended you make the following change in your medication: Discontinue your coumadin as per Tereso Newcomer, PA-C/Klein  Appended Document: Orders Update    Clinical Lists Changes  Orders: Added new Test order of T- * Misc. Laboratory test [phone]) - Signed Added new Test order of T- *  Misc. Laboratory test [phone]) - Signed

## 2010-11-01 NOTE — Op Note (Signed)
NAMEJAMAN, [name]:  [id number]  MEDICAL RECORD NO.:  [id number]           PATIENT TYPE:  I  LOCATION:  2009                         FACILITY:  [facility]  PHYSICIAN:  Duke Salvia, MD, FACCDATE OF BIRTH:  October 08, 1940  DATE OF PROCEDURE:  10/25/2010 DATE OF DISCHARGE:                              OPERATIVE REPORT   PREOPERATIVE DIAGNOSIS:  Atrial flutter status post cardioversion.  POSTOPERATIVE DIAGNOSIS:  Atrial flutter status post cardioversion.  PROCEDURE:  Invasive electrophysiological study with arrhythmia induction and catheter ablation.  Following obtaining informed consent, the patient was brought to the electrophysiology laboratory and placed on the fluoroscopic table in supine position.  After routine prep and drape, cardiac catheterization was performed, local anesthesia and conscious sedation.  Invasive blood pressure monitoring and transcutaneous oxygen saturation monitoring were performed continuously throughout the procedure.  Following the procedure, the catheters were removed, hemostasis was obtained, and the patient was transferred to the floor in stable condition.  CATHETERS: 1. A 5-French quadripolar catheter was inserted via femoral vein to     the AV junction. 2. A 6-French octapolar catheter was inserted via the right femoral     vein to the coronary sinus. 3. A 7-French dual decapolar catheter was inserted via the left     femoral vein to the tricuspid annulus. 4. An 8-French 8-mm deflectable tip ablation catheter was inserted via     the right femoral vein and SL2 sheath to mapping sites in the     posterior septal space.  Surface leads, I, aVF, and V1 were monitored continuously throughout the procedure.  Following insertion of the catheters, a stimulation protocol included: 1. Incremental atrial pacing. 2. Incremental ventricular pacing. 3. Burst atrial pacing.  END-TIDAL RESULTS: 1. End-tidal surface  electrocardiogram:  First line is initial. 2. Rhythm was sinus. 3. RR interval was 970 msec. 4. PR interval was 339 msec, P-wave duration was 132 msec. 5. QT interval was 459 msec. 6. AH interval was 200 msec. 7. HV interval was 37 msec.  I should note that the pacer that had been programmed at this point in the DDI mode at 40.  Final: Rhythm:  Sinus; RR interval 995 msec; PR interval 340 msec; QRS duration 94 msec; QT interval 45 msec; and his catheter electrograms were lost because of some computer glitch.  AV nodal function had Wenckebach at 700 msec, the VA conduction was associated.  No evidence of accessory pathway was identified.  Ventricular response programmed stimulation was normal for ventricular stimulation as described.  Arrhythmias induced:  The patient was paced for the coronary sinus to demonstrate cavotricuspid isthmus conduction.  Catheter ablation was delivered across the cavotricuspid isthmus resulted in bidirectional isthmus block.  At the end of the A1, A2 interval, it was 156 msec and the STIM A2 interval was about 180 msec.  Radiofrequency energy, a total of 7 minutes of RF energy was applied across the cavotricuspid isthmus.  Fluoroscopy time, a total 6.4 minutes of fluoroscopy time was utilized at 7.5 frames per second.  IMPRESSION: 1. Normal sinus function. 2. Abnormal atrial function manifested by sustained  atrial flutter     with prior cardioversion with successful catheter ablation of the     cavotricuspid isthmus. 3. Abnormal atrioventricular nodal function with prolonged     atrioventricular Wenckebach cycle length of greater than 850 msec. 4. Normal His-Purkinje system function. 5. No accessory pathway. 6. Normal ventricular response to programmed stimulation.  SUMMARY/CONCLUSION:  The results of electrophysiological testing demonstrated cavotricuspid isthmus conduction which was successfully interrupted by catheter ablation.  The  patient's aspirin will be maintained.  His Pradaxa will be discontinued and we will follow him up as an outpatient.     Duke Salvia, MD, [facility]     SCK/MEDQ  D:  10/25/2010  T:  10/26/2010  Job:  [id number]  Electronically Signed by Sherryl Manges MD [facility] on 11/01/2010 10:01:19 PM

## 2010-11-02 ENCOUNTER — Telehealth: Payer: Self-pay | Admitting: Internal Medicine

## 2010-11-04 ENCOUNTER — Encounter: Payer: Self-pay | Admitting: Physician Assistant

## 2010-11-04 ENCOUNTER — Encounter: Payer: Self-pay | Admitting: Internal Medicine

## 2010-11-04 ENCOUNTER — Encounter (INDEPENDENT_AMBULATORY_CARE_PROVIDER_SITE_OTHER): Payer: Medicare Other

## 2010-11-04 ENCOUNTER — Ambulatory Visit (INDEPENDENT_AMBULATORY_CARE_PROVIDER_SITE_OTHER): Payer: Medicare Other | Admitting: Physician Assistant

## 2010-11-04 DIAGNOSIS — I495 Sick sinus syndrome: Secondary | ICD-10-CM

## 2010-11-04 DIAGNOSIS — I4892 Unspecified atrial flutter: Secondary | ICD-10-CM

## 2010-11-04 DIAGNOSIS — IMO0002 Reserved for concepts with insufficient information to code with codable children: Secondary | ICD-10-CM | POA: Insufficient documentation

## 2010-11-04 DIAGNOSIS — R319 Hematuria, unspecified: Secondary | ICD-10-CM

## 2010-11-04 NOTE — Assessment & Plan Note (Signed)
This appears minor.  I reassured him today.  Overall, his puncture sites appear benign.  He is to monitor this.  If he has any further discomfort or change in the size of the hematoma, he should contact us.  Otherwise, he can follow up with Dr. Graciela Husbands as indicated.

## 2010-11-04 NOTE — Progress Notes (Signed)
History of Present Illness: Primary Cardiologist:  Dr. Charlton Haws Primary Electrophysiologist:  Dr. Sherryl Manges  [name] is a 70 yo male with a h/o CAD, s/p CFX stent in the past, atrial flutter status post prior radiofrequency catheter ablation and implantation of a pacemaker secondary to significant bradycardia in October 2011 who has had documented recurrent atrial flutter.  Cardiac catheterization 07/03/10 demonstrated 30-40% LAD stenosis, 50% diagonal one; OM1 50%, AV circumflex and 30%, distal circumflex 70-80% (medical therapy); mid RCA 40%.  Echocardiogram October 2011 demonstrated an ejection fraction 60-65%; moderate LVH; mild LAE.  He was pace terminated to sinus rhythm with his device on 09/28/2010.  He returned to [facility] on February 13 and underwent radiofrequency catheter ablation of his atrial flutter.  His Coumadin was discontinued.  He was to remain on aspirin.  He he returns for earlier followup due to concerns over a knot at his groin site on the left.  He denies any pain.  He denies any redness.  He denies fevers.  He has occasional chest congestion.  He denies shortness of breath.  Denies palpitations.  Denies syncope.   Past Medical History  Diagnosis Date  . CAD (coronary artery disease)     A.  s/p CFX in the past;   B.  cath 06/2010: LAD 30-40%, D1 50%, OM1 50%, AVCFX stent 30%, dCFX 70-80% (med Rx), mRCA 40%;      C.  Echo 06/2010: EF 60-65%, mod LVH; mild LAE     . Heart block   . 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 Hypertension   . Multiple myeloma   . Sleep apnea   . Degenerative disc disease   . Venous insufficiency   . Cubital tunnel syndrome   . Atrial flutter     A. s/p prior ablation;  B.  s/p CTI ablation 10/24/10 (Dr. Graciela Husbands)    Current Medications: Current outpatient prescriptions:acetaminophen (TYLENOL) 500 MG tablet, Take 500 mg by mouth every 6 (six) hours as needed.  , Disp: , Rfl: ;  aspirin 81 MG tablet, Take  81 mg by mouth daily.  , Disp: , Rfl: ;  atenolol (TENORMIN) 50 MG tablet, Take 50 mg by mouth daily.  , Disp: , Rfl: ;  glucosamine-chondroitin 500-400 MG tablet, Take 1 tablet by mouth daily.  , Disp: , Rfl:  Multiple Vitamin (MULTIVITAMIN) capsule, Take 1 capsule by mouth daily.  , Disp: , Rfl: ;  nitroGLYCERIN (NITROSTAT) 0.4 MG SL tablet, Place 0.4 mg under the tongue every 5 (five) minutes as needed.  , Disp: , Rfl: ;  Omega-3 Fatty Acids (FISH OIL) 1200 MG CAPS, Take 1 capsule by mouth daily.  , Disp: , Rfl: ;  DISCONTD: warfarin (COUMADIN) 5 MG tablet, Take by mouth as directed.  , Disp: , Rfl:   No Known Allergies  Vital Signs: BP 122/68  Pulse 69  Ht 6' (1.829 m)  Wt 220 lb (99.791 kg)  BMI 29.84 kg/m2  PHYSICAL EXAM: Well nourished, well developed, in no acute distress HEENT: normal Cardiac:  normal S1, S2; RRR; no murmur Lungs:  clear to auscultation bilaterally, no wheezing, rhonchi or rales Abd: soft, nontender, no hepatomegaly Ext: no edema; tiny hematoma noted over left femoral vein site without pulsatile mass or bruit; right femoral vein site stable; ecchymoses noted bilat. groins with L>R. Skin: warm and dry Neuro:  CNs 2-12 intact, no focal abnormalities noted  ZOX:WRUEAV sense and ventricular paced with a  ventricular rate of 69  His device was interrogated today.  He had about 7 mode switches which were not true mode switches.  He appears to have remained in normal sinus rhythm since his ablation.

## 2010-11-04 NOTE — Assessment & Plan Note (Signed)
He is maintaining normal sinus rhythm post ablation.  He is now on aspirin therapy instead of Coumadin.  He will followup with Dr. Graciela Husbands as indicated.

## 2010-11-09 NOTE — Progress Notes (Signed)
Summary: groin site swollen  Phone Note Call from Patient Call back at Home Phone [phone] Call back at cell [phone]   Caller: Patient Reason for Call: Talk to Nurse Summary of Call: pt is in town. pt states groin site is swelling about dime size where pt got a the procedure done. pt is at another doctor office pt will be free around 10 45am to speak with a nurse. Initial call taken by: Roe Coombs,  November 02, 2010 9:34 AM  Follow-up for Phone Call        [facility] Scherrie Bateman, LPN  November 02, 2010 5:25 PM SPOKE WITH PT KNOT NOTED TO GROIN 3 DAYS AGO NO C/O PAINPT AWARE WILL DISCUSS WITH DR Graciela Husbands. Follow-up by: Scherrie Bateman, LPN,  November 03, 2010 8:26 AM  Additional Follow-up for Phone Call Additional follow up Details #1::        if concerned have pt seen by pa Additional Follow-up by: Nathen May, MD, [facility],  November 03, 2010 5:57 PM     Appended Document: groin site swollen pt aware appt made with Tereso Newcomer pac for 11/04/10 at 11:30 am.

## 2010-11-09 NOTE — Assessment & Plan Note (Signed)
Summary: Please see note scanned in done in [facility].   Visit Type:  Follow-up Referring Provider:  Charlton Haws Primary Provider:  Avva  CC:  knot in groin area.  History of Present Illness: Please see note scanned in done in [facility].  Current Medications (verified): 1)  Aspirin 81 Mg Tbec (Aspirin) .... Take One Tablet By Mouth Daily 2)  Atenolol 50 Mg Tabs (Atenolol) .... Take One Tablet By Mouth Daily 3)  Multivitamins   Tabs (Multiple Vitamin) .Marland Kitchen.. 1 Tab By Mouth Once Daily 4)  Fish Oil 1200 Mg Caps (Omega-3 Fatty Acids) .... Once Daily 5)  Glucosamine-Chondroitin   Caps (Glucosamine-Chondroit-Vit C-Mn) .... Once Daily 6)  Acetaminophen 500 Mg  Caps (Acetaminophen) .... As Needed 7)  Nitrostat 0.4 Mg Subl (Nitroglycerin) .Marland Kitchen.. 1 Tablet Under Tongue At Onset of Chest Pain; You May Repeat Every 5 Minutes For Up To 3 Doses.  Allergies (verified): No Known Drug Allergies  Vital Signs:  Patient profile:   71 year old male Height:      72 inches Weight:      220 pounds BMI:     29.95 Pulse rate:   69 / minute BP sitting:   122 / 68  (right arm) Cuff size:   regular  Vitals Entered By: Hardin Negus, RMA (November 04, 2010 11:42 AM)   PPM Specifications Following MD:  Sherryl Manges, MD     PPM Vendor:  St Jude     PPM Model Number:  [id number]     PPM Serial Number:  [id number] PPM DOI:  07/02/2010     PPM Implanting MD:  Sherryl Manges, MD  Lead 1    Location: RA     DOI: 07/02/2010     Model #: [id number]     Serial #: [id number]     Status: active Lead 2    Location: RV     DOI: 07/02/2010     Model #: [id number]     Serial #: [id number]     Status: active  Magnet Response Rate:  BOL 100 ERI 85  Indications:  [name]   Episodes Coumadin:  Yes  Parameters Mode:  DDIR     Lower Rate Limit:  60     Upper Rate Limit:  120 Paced AV Delay:  200     Sensed AV Delay:  200  Other Orders: EKG w/ Interpretation (93000)  Patient Instructions: 1)  Your physician recommends  that you schedule a follow-up appointment in: KEEP APPT WITH DR. Graciela Husbands 12/13/10 @ 11:15... 2)  Your physician recommends that you continue on your current medications as directed. Please refer to the Current Medication list given to you today.

## 2010-11-12 ENCOUNTER — Encounter (INDEPENDENT_AMBULATORY_CARE_PROVIDER_SITE_OTHER): Payer: Self-pay | Admitting: *Deleted

## 2010-11-18 NOTE — Letter (Signed)
Summary: Appointment - Reschedule  [facility], Main Office  [address]   [place], [place] [address]   Phone: [phone]  Fax: [phone]     November 12, 2010 MRN: [id number]   JASPER HANF [address] [place], [place]  [address]   Dear Mr. APOSTOL,   Due to a change in our office schedule, your appointment on 12-13-2010                       at 11:15 a.m.  must be changed.  It is very important that we reach you to reschedule this appointment. We look forward to participating in your health care needs. Please contact us at the number listed above at your earliest convenience to reschedule this appointment.     Sincerely,       Lorne Skeens  [facility] Scheduling Team

## 2010-11-18 NOTE — Procedures (Signed)
Summary: PACER CHECK [facility]ER CAROL [facility] KELLY/JML   Current Medications (verified): 1)  Aspirin Ec 325 Mg Tbec (Aspirin) .... Take One Tablet By Mouth Daily 2)  Atenolol 50 Mg Tabs (Atenolol) .... Take One Tablet By Mouth Daily 3)  Multivitamins   Tabs (Multiple Vitamin) .Marland Kitchen.. 1 Tab By Mouth Once Daily 4)  Fish Oil 1200 Mg Caps (Omega-3 Fatty Acids) .... Once Daily 5)  Vitamin C 500 Mg Tabs (Ascorbic Acid) .... Once Daily 6)  Glucosamine-Chondroitin   Caps (Glucosamine-Chondroit-Vit C-Mn) .... Once Daily 7)  Acetaminophen 500 Mg  Caps (Acetaminophen) .... As Needed 8)  Nitrostat 0.4 Mg Subl (Nitroglycerin) .Marland Kitchen.. 1 Tablet Under Tongue At Onset of Chest Pain; You May Repeat Every 5 Minutes For Up To 3 Doses. 9)  Warfarin Sodium 5 Mg Tabs (Warfarin Sodium) .... Use As Directed By Anticoagulation Clinic  Allergies (verified): No Known Drug Allergies  PPM Specifications Following MD:  Sherryl Manges, MD     PPM Vendor:  St Jude     PPM Model Number:  [id number]     PPM Serial Number:  [id number] PPM DOI:  07/02/2010     PPM Implanting MD:  Sherryl Manges, MD  Lead 1    Location: RA     DOI: 07/02/2010     Model #: [id number]     Serial #: [id number]     Status: active Lead 2    Location: RV     DOI: 07/02/2010     Model #: [id number]     Serial #: [id number]     Status: active  Magnet Response Rate:  BOL 100 ERI 85  Indications:  [name]   Episodes Coumadin:  Yes  Parameters Mode:  DDIR     Lower Rate Limit:  60     Upper Rate Limit:  120 Paced AV Delay:  200     Sensed AV Delay:  200 Tech Comments:  see paceart report. Vella Kohler  November 05, 2010 1:17 PM

## 2010-11-18 NOTE — Cardiovascular Report (Signed)
Summary: Office Visit   Office Visit   Imported By: [name] Ovens 11/11/2010 15:57:32  _____________________________________________________________________  External Attachment:    Type:   Image     Comment:   External Document

## 2010-11-23 NOTE — Progress Notes (Signed)
Summary: [facility]: Office Visit  [facility]: Office Visit   Imported By: Earl Many 11/12/2010 18:20:25  _____________________________________________________________________  External Attachment:    Type:   Image     Comment:   External Document

## 2010-11-23 NOTE — Progress Notes (Signed)
Summary: Office Visit  Office Visit   Imported By: Earl Many 11/16/2010 10:42:34  _____________________________________________________________________  External Attachment:    Type:   Image     Comment:   External Document

## 2010-11-24 LAB — CBC
HCT: 42.4 % (ref 39.0–52.0)
Hemoglobin: 14.9 g/dL (ref 13.0–17.0)
MCH: 33.9 pg (ref 26.0–34.0)
MCH: 34.3 pg — ABNORMAL HIGH (ref 26.0–34.0)
MCHC: 34.4 g/dL (ref 30.0–36.0)
MCHC: 35.1 g/dL (ref 30.0–36.0)
Platelets: 175 10*3/uL (ref 150–400)
RBC: 4.42 MIL/uL (ref 4.22–5.81)
RDW: 12.3 % (ref 11.5–15.5)
RDW: 12.6 % (ref 11.5–15.5)

## 2010-11-24 LAB — BASIC METABOLIC PANEL
BUN: 3 mg/dL — ABNORMAL LOW (ref 6–23)
BUN: 7 mg/dL (ref 6–23)
CO2: 25 mEq/L (ref 19–32)
Calcium: 8.7 mg/dL (ref 8.4–10.5)
Calcium: 8.8 mg/dL (ref 8.4–10.5)
Calcium: 9.1 mg/dL (ref 8.4–10.5)
Creatinine, Ser: 0.58 mg/dL (ref 0.4–1.5)
GFR calc Af Amer: >60 mL/min (ref >60)
GFR calc non Af Amer: >60 mL/min (ref >60)
GFR calc non Af Amer: >60 mL/min (ref >60)
GFR calc non Af Amer: >60 mL/min (ref >60)
Glucose, Bld: 101 mg/dL — ABNORMAL HIGH (ref 70–99)
Glucose, Bld: 103 mg/dL — ABNORMAL HIGH (ref 70–99)
Glucose, Bld: 87 mg/dL (ref 70–99)
Sodium: 132 mEq/L — ABNORMAL LOW (ref 135–145)
Sodium: 134 mEq/L — ABNORMAL LOW (ref 135–145)

## 2010-11-24 LAB — PROTIME-INR
INR: 1.14 (ref 0.00–1.49)
Prothrombin Time: 14.9 seconds (ref 11.6–15.2)

## 2010-11-24 LAB — CK TOTAL AND CKMB (NOT AT ARMC): CK, MB: 2.6 ng/mL (ref 0.3–4.0)

## 2010-11-24 LAB — CARDIAC PANEL(CRET KIN+CKTOT+MB+TROPI)
Relative Index: INVALID (ref 0.0–2.5)
Total CK: 47 U/L (ref 7–232)
Total CK: 47 U/L (ref 7–232)

## 2010-11-24 LAB — MAGNESIUM: Magnesium: 2.4 mg/dL (ref 1.5–2.5)

## 2010-11-24 LAB — TSH: TSH: 1.471 u[IU]/mL (ref 0.350–4.500)

## 2010-11-24 LAB — APTT: aPTT: 52 seconds — ABNORMAL HIGH (ref 24–37)

## 2010-11-24 LAB — TROPONIN I: Troponin I: 0.01 ng/mL (ref 0.00–0.06)

## 2010-11-26 LAB — CBC
MCHC: 35.4 g/dL (ref 30.0–36.0)
RDW: 12.9 % (ref 11.5–15.5)

## 2010-11-26 LAB — DIFFERENTIAL
Basophils Absolute: 0 10*3/uL (ref 0.0–0.1)
Basophils Relative: 0 % (ref 0–1)
Eosinophils Absolute: 0.2 10*3/uL (ref 0.0–0.7)
Neutro Abs: 2.8 10*3/uL (ref 1.7–7.7)
Neutrophils Relative %: 66 % (ref 43–77)

## 2010-11-26 LAB — CHROMOSOME ANALYSIS, BONE MARROW

## 2010-12-02 NOTE — Discharge Summary (Signed)
[name], [name]:  [id number]  MEDICAL RECORD NO.:  [id number]           PATIENT TYPE:  I  LOCATION:  2009                         FACILITY:  [facility]  PHYSICIAN:  Duke Salvia, MD, FACCDATE OF BIRTH:  09-28-1940  DATE OF ADMISSION:  10/25/2010 DATE OF DISCHARGE:  10/26/2010                              DISCHARGE SUMMARY   PRIMARY CARE PHYSICIAN:  [name], MD  PRIMARY CARDIOLOGIST:  [name], MD, [facility]  ELECTROPHYSIOLOGIST:  Duke Salvia, MD, [facility]  PRIMARY DIAGNOSIS:  Atrial flutter.  SECONDARY DIAGNOSES: 1. Heart block - status post St. Jude medical pacemaker in October     2011. 2. Hypertension. 3. Coronary artery disease - status post circumflex stent in the past.     Cardiac catheterization in October 2011 demonstrated left anterior     descending 30% to 40% stenosis with other nonobstructive disease     with medical therapy recommended. 4. Multiple myeloma. 5. Obstructive sleep apnea - treated with continuous positive airway     pressure. 6. 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 7. Lower extremity varicosities. 8. Venous insufficiency. 9. Carpal tunnel syndrome.  ALLERGIES:  The patient has no known drug allergies.  PROCEDURES THIS ADMISSION:  Electrophysiology study and radiofrequency catheter ablation of atrial flutter on October 25, 2010, by Dr. Graciela Husbands. This demonstrated successful cavotricuspid isthmus ablation.  Plans to discontinue the patient's warfarin and maintain the patient on aspirin 81 mg daily.  BRIEF HISTORY OF PRESENT ILLNESS:  [name] is a 70 year old male with a history of coronary artery disease, atrial flutter, status post ablation in the past, pacemaker secondary to significant bradycardia who has documented recurrent atrial flutter.  He was referred to Dr. Graciela Husbands for treatment options.  Risks, benefits, and alternatives to repeat catheter ablation were discussed with the patient  and he wished to proceed.  HOSPITAL COURSE:  The patient was admitted on October 25, 2010, for planned ablation of atrial flutter.  This was carried out by Dr. Graciela Husbands with details as outlined above.  He was monitored on telemetry overnight which demonstrated atrial pacing.  The patient's groin incision were without hematoma or ecchymosis.  EKG demonstrated atrial pacing with the rate of 66 with intervals of 0.11, 0.12, 0.45.  Dr. Graciela Husbands examined the patient on October 26, 2010, considered him stable for discharge.  DISCHARGE INSTRUCTIONS: 1. Increase activity slowly. 2. No driving for 2 days. 3. Follow a low-sodium, heart-healthy diet. 4. Keep groin incisions clean and dry.  FOLLOWUP APPOINTMENTS: 1. Dr. Graciela Husbands on December 13, 2010, at 11:15 a.m. 2. Dr. Eden Emms, as scheduled. 3. Dr. Vassie Loll, as scheduled.  DISCHARGE MEDICATIONS: 1. Aspirin 81 mg daily - this is a decreased dose for the patient. 2. Atenolol 50 mg daily. 3. Fish oil 1200 mg daily. 4. Glucosamine chondroitin 2 tablets daily. 5. Loratadine 10 mg daily as needed. 6. Multivitamin daily. 7. Nitroglycerin as needed for chest pain. 8. Vitamin C 500 mg daily.  Of note, the patient's warfarin has been discontinued secondary to successful ablation of atrial flutter.  DISPOSITION:  The patient was seen and  examined by Dr. Graciela Husbands on October 26, 2010, and considered stable for discharge.  DURATION OF DISCHARGE ENCOUNTER:  35 minutes.     Gypsy Balsam, RN,BSN   ______________________________ Duke Salvia, MD, [facility]    AS/MEDQ  D:  10/26/2010  T:  10/26/2010  Job:  [id number]  cc:   [name], MD, [facility] [name], M.D.  Electronically Signed by Gypsy Balsam RNBSN on 11/08/2010 02:34:33 PM Electronically Signed by Sherryl Manges MD [facility] on 12/02/2010 08:32:07 AM

## 2010-12-13 ENCOUNTER — Encounter: Payer: MEDICARE | Admitting: Internal Medicine

## 2010-12-20 LAB — PROTIME-INR: Prothrombin Time: 26.2 seconds — ABNORMAL HIGH (ref 11.6–15.2)

## 2010-12-27 ENCOUNTER — Encounter: Payer: Self-pay | Admitting: Internal Medicine

## 2010-12-27 ENCOUNTER — Ambulatory Visit (INDEPENDENT_AMBULATORY_CARE_PROVIDER_SITE_OTHER): Payer: Medicare Other | Admitting: Internal Medicine

## 2010-12-27 ENCOUNTER — Telehealth: Payer: Self-pay | Admitting: Internal Medicine

## 2010-12-27 DIAGNOSIS — Z950 Presence of cardiac pacemaker: Secondary | ICD-10-CM

## 2010-12-27 DIAGNOSIS — Z01810 Encounter for preprocedural cardiovascular examination: Secondary | ICD-10-CM

## 2010-12-27 DIAGNOSIS — I4892 Unspecified atrial flutter: Secondary | ICD-10-CM

## 2010-12-27 DIAGNOSIS — I4891 Unspecified atrial fibrillation: Secondary | ICD-10-CM

## 2010-12-27 DIAGNOSIS — I2510 Atherosclerotic heart disease of native coronary artery without angina pectoris: Secondary | ICD-10-CM

## 2010-12-27 NOTE — Patient Instructions (Signed)
Your physician recommends that you schedule a follow-up appointment in: 6 MONTHS WITH DR KLEIN Your physician recommends that you continue on your current medications as directed. Please refer to the Current Medication list given to you today. 

## 2010-12-27 NOTE — Telephone Encounter (Signed)
Pt states his primary dr is DR. Revi Avva. Pt wanted to let dr Graciela Husbands to know

## 2010-12-27 NOTE — Assessment & Plan Note (Signed)
This is stable. This should not preclude knee replacement surgery

## 2010-12-27 NOTE — Assessment & Plan Note (Signed)
Post ablation. Interrogation of the pacemaker demonstrates no recurrent atrial arrhythmias. He is maintained on aspirin

## 2010-12-27 NOTE — Progress Notes (Signed)
  HPI  [name] is a 70 y.o. male Seen in followup for atrial flutter status post ablation. He also has a history of complete heart block status post pacemaker.  He is without significant symptoms of chest pain shortness of breath or edema.  His major issue is his L knee which he hopes to have replaced by Dr  Thomasena Edis He has coronary artery disease - status post circumflex stent in the past.     Cardiac catheterization in October 2011 demonstrated left anterior     descending 30% to 40% stenosis with other nonobstructive disease     with medical therapy recommended.   ALLERGIES:  The patient has no known drug allergies.  PROCEDURES THIS ADMISSION:  Electrophysiology study and radiofrequency catheter ablation of atrial flutter on October 25, 2010, by Dr. Graciela Husbands. This demonstrated successful cavotricuspid isthmus ablation.  Plans to discontinue the patien Past Medical History  Diagnosis Date  . CAD (coronary artery disease)     A.  s/p CFX in the past;   B.  cath 06/2010: LAD 30-40%, D1 50%, OM1 50%, AVCFX stent 30%, dCFX 70-80% (med Rx), mRCA 40%;      C.  Echo 06/2010: EF 60-65%, mod LVH; mild LAE     . Heart block   . 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 Hypertension   . Multiple myeloma   . Sleep apnea   . Degenerative disc disease   . Venous insufficiency   . Cubital tunnel syndrome   . Atrial flutter     A. s/p prior ablation;  B.  s/p CTI ablation 10/24/10 (Dr. Graciela Husbands)    Past Surgical History  Procedure Date  . Coronary angioplasty with stent placement 2005  . Ventral hernia repair   . Right olecranon nerve surgery     Current Outpatient Prescriptions  Medication Sig Dispense Refill  . acetaminophen (TYLENOL) 500 MG tablet Take 500 mg by mouth every 6 (six) hours as needed.        Marland Kitchen aspirin 81 MG tablet Take 81 mg by mouth daily.        Marland Kitchen atenolol (TENORMIN) 50 MG tablet Take 50 mg by mouth daily.        Marland Kitchen glucosamine-chondroitin 500-400 MG  tablet Take 1 tablet by mouth daily.        . Multiple Vitamin (MULTIVITAMIN) capsule Take 1 capsule by mouth daily.        . nitroGLYCERIN (NITROSTAT) 0.4 MG SL tablet Place 0.4 mg under the tongue every 5 (five) minutes as needed.        . Omega-3 Fatty Acids (FISH OIL) 1200 MG CAPS Take 1 capsule by mouth daily.          No Known Allergies  Review of Systems negative except from HPI and PMH  Physical Exam Well developed and well nourished in no acute distress HENT normal; hearing aid in place  E scleral and icterus clear Neck Supple JVP flat; carotids brisk and full Clear to ausculation Regular rate and rhythm, no murmurs gallops or rub Soft with active bowel sounds No clubbing cyanosis and edema Alert and oriented, grossly normal motor and sensory function Skin Warm and Dry     Assessment and  Plan

## 2011-01-12 ENCOUNTER — Ambulatory Visit: Payer: Self-pay | Admitting: Cardiovascular Disease

## 2011-01-20 ENCOUNTER — Encounter (HOSPITAL_BASED_OUTPATIENT_CLINIC_OR_DEPARTMENT_OTHER): Payer: Medicare Other | Admitting: Oncology

## 2011-01-20 ENCOUNTER — Other Ambulatory Visit (HOSPITAL_COMMUNITY): Payer: Self-pay | Admitting: Oncology

## 2011-01-20 DIAGNOSIS — D472 Monoclonal gammopathy: Secondary | ICD-10-CM

## 2011-01-20 DIAGNOSIS — I2510 Atherosclerotic heart disease of native coronary artery without angina pectoris: Secondary | ICD-10-CM

## 2011-01-20 DIAGNOSIS — N400 Enlarged prostate without lower urinary tract symptoms: Secondary | ICD-10-CM

## 2011-01-20 DIAGNOSIS — G4730 Sleep apnea, unspecified: Secondary | ICD-10-CM

## 2011-01-20 LAB — CBC WITH DIFFERENTIAL/PLATELET
Basophils Absolute: 0.1 10*3/uL (ref 0.0–0.1)
EOS%: 9.6 % — ABNORMAL HIGH (ref 0.0–7.0)
HGB: 14.4 g/dL (ref 13.0–17.1)
LYMPH%: 18.9 % (ref 14.0–49.0)
MCH: 33.3 pg (ref 27.2–33.4)
MCV: 95.2 fL (ref 79.3–98.0)
MONO%: 12.1 % (ref 0.0–14.0)
Platelets: 191 10*3/uL (ref 140–400)
RBC: 4.33 10*6/uL (ref 4.20–5.82)
RDW: 12 % (ref 11.0–14.6)

## 2011-01-21 LAB — COMPREHENSIVE METABOLIC PANEL
Alkaline Phosphatase: 52 U/L (ref 39–117)
BUN: 8 mg/dL (ref 6–23)
CO2: 24 mEq/L (ref 19–32)
Creatinine, Ser: 0.64 mg/dL (ref 0.40–1.50)
Glucose, Bld: 94 mg/dL (ref 70–99)
Sodium: 134 mEq/L — ABNORMAL LOW (ref 135–145)
Total Bilirubin: 0.7 mg/dL (ref 0.3–1.2)
Total Protein: 8.5 g/dL — ABNORMAL HIGH (ref 6.0–8.3)

## 2011-01-21 LAB — LACTATE DEHYDROGENASE: LDH: 173 U/L (ref 94–250)

## 2011-01-21 LAB — IGG, IGA, IGM
IgA: 105 mg/dL (ref 68–378)
IgG (Immunoglobin G), Serum: 435 mg/dL — ABNORMAL LOW (ref 700–1600)
IgM, Serum: 4340 mg/dL — ABNORMAL HIGH (ref 60–263)

## 2011-01-25 NOTE — Op Note (Signed)
NAMESANCHEZ, HEMMER                ACCOUNT NO.:  [id number]   MEDICAL RECORD NO.:  [id number]          PATIENT TYPE:  AMB   LOCATION:  SDS                          FACILITY:  [facility]   PHYSICIAN:  Dionne Ano. Gramig III, M.D.DATE OF BIRTH:  [birth year]   DATE OF PROCEDURE:  08/28/2007  DATE OF DISCHARGE:                               OPERATIVE REPORT   PREOPERATIVE DIAGNOSES:  1. Status post comminuted complex distal radius fracture with retained      hardware, now healed completely, in terms of bony consolidation,      the patient has adhesions and painful hardware.  2. Cubital tunnel syndrome (compression of the ulnar nerve at the      elbow right upper extremity).   POSTOPERATIVE DIAGNOSES:  1. Status post comminuted complex distal radius fracture with retained      hardware, now healed completely, in terms of bony consolidation,      the patient has adhesions and painful hardware.  2. Cubital tunnel syndrome (compression of the ulnar nerve at the      elbow right upper extremity).   PROCEDURE:  1. Removal of hardware, right forearm.  2. Tenolysis, extensive nature, flexor carpi radialis tendon with      concomitant fascial release of the volar forearm.  3. Cubital tunnel release and anterior ulnar nerve transposition,      right elbow.  4. Flexor pronator lengthening, right elbow   SURGEON:  Dionne Ano. [name], M.D.   ASSISTANT:  Karie Chimera, P.A.-C.   COMPLICATIONS:  None.   ANESTHESIA:  General.   TOURNIQUET TIME:  Less than 90 minutes.   DRAINS:  One in the elbow, one in the forearm   ESTIMATED BLOOD LOSS:  Less than 70 mL.   INDICATIONS FOR PROCEDURE:  Mr. Walkowski is a pleasant male who is 70  years of age and presents with the above-mentioned diagnosis.  I have  counseled him in regards to the risks and benefits of surgery including  risk of infection, bleeding anesthesia, damage to normal structures and  failure of surgery to accomplish its intended goals of  relieving  symptoms and restoring function.  With this in mind, he was asked to  proceed.  All questions were encouraged and answered preoperatively.   OPERATIVE PROCEDURE IN DETAIL:  The patient was seen by myself and  anesthesia.  Arm was marked, permit signed.  He was given preoperative  antibiotics and was counseled extensively.  Dr. Avel Peace  performed a umbilical hernia repair at the same surgical setting, and  he, of course, discussed all issues with him as well.  Once in the  operative suite, general anesthesia was secured by anesthesia staff.  The arm was prepped and draped in usual sterile fashion with Betadine  scrub and paint.  Sterile field was secured, arm was elevated, and a  tourniquet was inflated to 250 mmHg.  A volar radial incision was made  through previous scar tissue.  Dissection was carried down.  The FCR was  identified and underwent extensive tendolysis and tenosynovectomy as the  patient did have some  noted significant adhesions.  This was released in  its entirety, and the fascial release volarly was accomplished.  Following this, I identified the plate and removed it with standard  tools.  All screws and plate removed.  Following this, periosteal tissue  was I&D, and the pronator was then closed with Vicryl.  TLS drain was  placed, and the wound was irrigated.  Following this, curvilinear  incision was made about the __________.  This was carried down.  Medial  antebrachial cutaneous nerve branches were identified and kept out of  harm's way.  Following this, the patient was released under 4.0 loop  magnification about the arcade of Strother's medial intermuscular  septum, two heads of the FCU, cubital tunnel and Osborne's ligament.  Interestingly, the patient had a very prominent anconeus epitrochlearis  muscle which was released.  Once this done, the nerve was mobilized on a  vessel loop very carefully preserving its epineurial plexus of vessels.   Once this done, the flexor pronator area underwent a Z lengthening type  technique, fascia was incised, and the fascial regions were clipped to  allow for relaxation of the muscular tendinous region.  This was a  flexor pronator lengthening.  Following this, nerve was transposed.  Tourniquet deflated.  Hemostasis obtained with bipolar cautery.  The  medial intermuscular septum was excised.  There was no kinking of the  nerve, and it was inset in an anterior transposed state by suturing a  fascial sling against the subcutaneous.  The patient tolerated this  well.  I irrigated copiously, made sure hemostasis was secured, and  placed in a medium Hemovac drain.  Wound was then closed in layers of 3-  0 Vicryl about the cubital tunnel, 3-0 Vicryl about the subcu, and a  subcuticular Prolene and skin edge.  Steri-Strips were applied.  Twenty  mL of Sensorcaine 0.25% without epinephrine was placed in the wound for  postoperative analgesia.   Following this, hemostasis was secured about the wrist, a #7 TLS drain  was placed, and wound was closed in layers with 3-0 Vicryl followed by  Prolene.  Once this was done, the patient had all drains hooked up to  suction.  Dressings applied, and there were no complicating features.  He will be admitted overnight for overnight observation.  Will plan to  change the drains every 3-4 hours, elevate, ice, pain management  according to his needs and see him back in the office in 12 days with  therapy appointment immediate following.  I have discussed with him the  do's and don't's, etc., and all questions have been encouraged and  answered.           ______________________________  Dionne Ano. Everlene Other, M.D.     Nash Mantis  D:  08/28/2007  T:  08/29/2007  Job:  [id number]   cc:   Dionne Ano. Everlene Other, M.D.  [name], MD, [facility]

## 2011-01-25 NOTE — Op Note (Signed)
NAMEJAKYLAN, [name]                ACCOUNT NO.:  [id number]   MEDICAL RECORD NO.:  [id number]          PATIENT TYPE:  AMB   LOCATION:  SDS                          FACILITY:  [facility]   PHYSICIAN:  Adolph Pollack, M.D.DATE OF BIRTH:  [birth year]   DATE OF PROCEDURE:  08/28/2007  DATE OF DISCHARGE:                               OPERATIVE REPORT   PREOPERATIVE DIAGNOSIS:  Umbilical hernia.   POSTOPERATIVE DIAGNOSIS:  Umbilical hernia.   PROCEDURE:  Umbilical hernia repair with mesh.   SURGEON:  Adolph Pollack, M.D.   ANESTHESIA:  General.   INDICATIONS:  This is a 70 year old male with a history of previous  epigastric hernia repair.  He has noticed an intermittently painful  bulge in the supraumbilical area consistent with hernia that is  reducible and now presents for repair.  Dr. [name] will also be doing  some right upper extremity surgery simultaneously.   TECHNIQUE:  He is seen in the holding area and brought to the operating  room, placed supine on the operating table and general anesthetic was  administered.  The hair in the periumbilical area was clipped and the  area sterilely prepped and draped.  A supraumbilical curvilinear  incision was made through skin, subcutaneous tissue and down to level of  the fascia.  I then dissected around the umbilicus and released it  and  noticed the hernia directly deep to it, but he also had a supraumbilical  defect with a small bridge of tissue between the umbilical hernia and  the supraumbilical fascial defect.  I went ahead and divided this  fascial bridge creating one defect.  I then dissected the subcutaneous  tissue all off the fascia for a distance of 4 cm around the defect.  There is no bowel up in the hernia.   Following this, I primarily close the hernia with interrupted 0 Surgilon  sutures leaving these long.  I then brought a piece of polypropylene  mesh into the field and threaded the sutures directly through the  middle  of the mesh and tied the sutures down anchoring the mesh directly over  the primary repair.  The sutures were then cut.   The periphery of the mesh was then anchored to the fascia with adequate  overlap with a running 0-0 Prolene suture.  Excess mesh was trimmed.   Marcaine solution was then infiltrated to the fascia and subcutaneous  tissue.  Hemostasis was adequate.   The umbilicus was implanted onto the mesh with a 3-0 Vicryl suture.  The  subcutaneous tissue closed over the mesh with running 3-0 Vicryl suture.  The skin was closed with a Monocryl subcuticular stitch followed by  Steri-Strips and sterile dressing.   The patient tolerated procedure without apparent complications and Dr.  [name] was continuing his procedure at this time.      Adolph Pollack, M.D.  Electronically Signed     TJR/MEDQ  D:  08/28/2007  T:  08/29/2007  Job:  [id number]   cc:   [name], MD, [facility]  Dionne Ano. Everlene Other, M.D.

## 2011-01-25 NOTE — Assessment & Plan Note (Signed)
[facility]                            CARDIOLOGY OFFICE NOTE   NAME:FIELDSDmari, Schubring                       MRN:          [id number]  DATE:07/29/2008                            DOB:          [birth year]    Willmer returns today for follow up.  He has coronary artery disease with  previous complex intervention of the circ.  He had a low-risk Myoview on  May 01, 2008.  He had mild inferior wall ischemia.   His EF was 71%.  He has not been having any significant chest pain.  He  is stable on aspirin, Plavix, and beta blockers.  He is active.  There  is a gym that opened up near his home which he has been working out  with.  He has significant anterior cervical spine problems and I sent  him to Dr. Wynetta Emery.  He has had some excellent physical therapy and seems  to be improving.  He has a followup with Dr. Iona Hansen in a month.  He had  some issues to discuss regarding Dr. Penni Bombard.  I have had this  discussion with many of his patients regarding him switching over to a  non-insurance type boutique medicine.  Markail does not seem happy about  this.  He was asking for names of other possible physicians who would  take in new Medicare patients.   Otherwise, he is doing well.  He sees Dr. Shelle Iron for sleep apnea and has  some mild chronic exertional dyspnea.  He has not had any significant  palpitations, PND, or orthopnea.   He has been compliant with his meds.  He was hoping that a generic  Plavix was available.  I talked on little bit about his cholesterol  issues because of the recent news articles.  He does not want to be on  Zetia or niacin.  He has been intolerant to statins which have caused  LFT elevations, his LDL cholesterol runs about 100.  I tried to stress  increase diet therapy for him.   CURRENT MEDICATIONS:  1. Atenolol 25 a day.  2. Plavix 75 a day.  3. Multivitamin scarlet.  4. Glucosamine.  5. Aspirin.  6. Claritin.   PHYSICAL EXAMINATION:   GENERAL:  Remarkable for an overweight elderly  white male in no distress.  VITAL SIGNS:  His weight is 227, blood pressure 132/70, pulse 62 and  regular, respiratory rate 14, afebrile.  HEENT:  Unremarkable.  Carotids normal without bruit.  No  lymphadenopathy, thyromegaly, JVP elevation.  LUNGS:  Clear.  Good diaphragmatic motion.  No wheezing.  S1 and S2 with  normal heart sounds.  PMI normal.  ABDOMEN:  Benign.  Bowel sounds positive.  No AAA, no tenderness, no  bruit, no hepatosplenomegaly, hepatojugular reflux, tenderness.  Distal  pulses are intact.  No edema.  NEURO:  Nonfocal.  SKIN:  Warm and dry.  MUSCULOSKELETAL:  No muscular weakness.   His EKG at baseline shows sinus rhythm with first-degree heart block.   IMPRESSION:  1. Coronary artery disease, previous circ intervention.  Continue  aspirin, Plavix, currently not having significant angina and had a      low-risk Myoview, may have to reassess if he needs surgery in the      future.  2. Hypertension.  Currently well controlled.  Continue low-dose beta-      blocker.  3. First-degree heart block.  No evidence of syncope.  Followup EKG in      6 months.  Try not to increase atenolol dose any further.  4. Anterior cervical spine problem.  Continue physical therapy and      rehab, followup with Dr. Wynetta Emery.  5. Seasonal allergies.  Continue over-the-counter Claritin.  Overall,      I think Zuriel is stable.  He had myriad of questions today      regarding his medications about his continuing to follow with Dr.      Penni Bombard and his followup for his neck.  All of these questions were      answered.  I will see him again in 6 months.     [name], MD, [facility]  Electronically Signed    PCN/MedQ  DD: 07/29/2008  DT: 07/29/2008  Job #: [id number]

## 2011-01-25 NOTE — Assessment & Plan Note (Signed)
[facility]                            CARDIOLOGY OFFICE NOTE   NAME:FIELDSCordelle, Dahmen                       MRN:          [id number]  DATE:04/30/2008                            DOB:          Apr 02, 1941    [name] returns today for followup.  He had a quite a bit to talk  about today.   He has a history of coronary artery disease with complex intervention to  the circumflex coronary artery.  He had a nonischemic Myoview in August  2008.   He has had 2 episodes of angina a week ago.  He took one nitroglycerin  for them.  He said he has been under a lot of stress.  He did not have  any recurrences.  He did not have prolonged pain.  There was no  associated shortness of breath, diaphoresis, or palpitations.   He has a bit of a health crisis over the last year, so he had a ventral  hernia repair with Dr. Abbey Chatters.  He subsequently had a right  olecranon nerve surgery with Dr. [name].  He has not had the results  that he wanted from this and continues to have pain in his right arm.   Subsequently, he was on his tractor and hit his head on a tree limb.  He  has had cervical pain since then.  Dr. 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   Dr. Penni Bombard initially had referred him to an orthopedic physician.  However, the patient did not feel comfortable with this and would rather  see a neurosurgeon.   In regards to his heart, he is currently not having angina this week.  I  explained to [place] that if there is a concern for need for cervical  surgery, he needs a stress Myoview.  He is due to have one this month  anyway.  He will continue his aspirin and Plavix.  He was scheduled for  a stress Myoview both to evaluate his chest pain and clear him for any  potential surgery.  His review of systems is otherwise remarkable for  continued lower extremity varicosities particularly  left sided.   CURRENT MEDICATIONS:  His current medications include:  1. Atenolol 25 a day.  2. Plavix 75 a day.  3. Multivitamins.  4. Zocor 40 a day.   PHYSICAL EXAMINATION:  GENERAL:  A talkative elderly white male in no  distress.  VITAL SIGNS:  Weight is 213, blood pressure is 123/65, pulse 54 and  regular, respiratory rate 14, afebrile.  HEENT:  Unremarkable.  NECK:  Carotids are normal without bruit.  No lymphadenopathy,  thyromegaly, or JVP elevation.  LUNGS:  Clear with good diaphragmatic motion.  No wheezing.  CARDIAC:  S1 and S2 with normal heart sounds.  PMI normal.  ABDOMEN:  Benign.  Bowel sounds positive.  Status post ventral hernia  repair.  No AAA.  No tenderness.  No bruit.  No hepatosplenomegaly or  hepatojugular reflux.  EXTREMITIES:  Distal pulses are  intact with marked varicosities in the  left lower extremity and mild on the right.  NEURO:  Nonfocal.  SKIN:  Warm and dry.  MUSCULOSKELETAL:  No muscular weakness.  I did not do range of motion in  the cervical spine.   His EKG shows sinus bradycardia with first-degree heart block, otherwise  normal QT intervals 438.   IMPRESSION:  1. Coronary artery disease, previous complex intervention to the      circumflex.  2. Isolated episodes of angina.  Follow up stress Myoview.  Continue      aspirin and beta-blocker.  3. Hypercholesterolemia.  Continue Zocor.  Lipid and liver profile in      6 months.  4. Cervical spine problem.  I talked to Ammaar about this at length.  I      will try to make his referral to Dr. Donalee Citrin, who is an excellent      neurosurgeon in town.  The patient seems much more comfortable with      a neurosurgeon handling his cervical spine problems, particularly      since he did not have the wanted results from the surgery on his      right arm with orthopedics.  5. Lower extremity varicosities.  The patient would like to see a vein      clinic for this.  I would encourage him to do  this, but he wants to      sort through his cervical spine problems first and I think this is      appropriate.  He will continue to monitor for signs of phlebitis.  6. Right olecranon nerve surgery.  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   Overall, I think Travas heart status will be stable and I do not think we  will have to pursue a heart cath.  His last Myoview in August was normal  and will act as a good baseline for any abnormalities.  He will call me  if he has any prolonged episodes or pain or starts taking his  nitroglycerin again.     [name], MD, [facility]  Electronically Signed    PCN/MedQ  DD: 04/30/2008  DT: 04/30/2008  Job #: [id number]

## 2011-01-25 NOTE — Op Note (Signed)
NAMEHARACE, MCCLUNEY NO.:  [id number]   MEDICAL RECORD NO.:  [id number]          PATIENT TYPE:  INP   LOCATION:  2508                         FACILITY:  [facility]   PHYSICIAN:  Duke Salvia, MD, FACCDATE OF BIRTH:  04-12-1941   DATE OF PROCEDURE:  03/02/2009  DATE OF DISCHARGE:                               OPERATIVE REPORT   PREOPERATIVE DIAGNOSIS:  Atrial flutter - atypical   POSTOPERATIVE DIAGNOSIS:  Reverse typical flutter.   PROCEDURE:  Invasive electrophysiological study, electroanatomical  mapping, and catheter ablation.   Following obtaining informed consent, the patient was brought to the  electrophysiology laboratory and placed on the fluoroscopic table in the  supine position.  After routine prep and drape, cardiac catheterization  was performed with local anesthesia and conscious sedation.  Noninvasive  blood pressure monitoring and transcutaneous oxygen saturation  monitoring were performed continuously throughout the procedure.  Following the procedure, the catheters were removed.  Hemostasis was  obtained, and the patient was transferred to the floor in stable  condition.   CATHETERS:  1. A 5-French quadripolar catheter was inserted via the left femoral      vein to the AV junction.  2. A 6-French octapolar catheter was inserted via the right femoral      vein to the coronary sinus.  3. A 5-French quadripolar catheter was inserted via the left femoral      vein to the right fascicular apex.  4. A 7-French dual decapolar catheter was inserted via the left      femoral vein to the tricuspid annulus and subsequently removed for      the array.  5. A 9-French ESI array catheter was inserted via the left femoral      vein to the right atrium.  6. An 8-French 8-mm deflectable tip catheter was inserted via the      right femoral vein to the posterior septal space.   Surface leads I, aVF, and V1 were monitored continuously throughout the  procedure.  Following insertion of the catheters, a stimulation protocol  included:  1. Incremental atrial pacing  2. Incremental ventricular pacing  3. Single atrial extra stimuli at paced cycle length of 800      milliseconds.  4. Entrainment mapping.   ECG and basic intervals/>  Initial  Rhythm:  Flutter:  RR interval:  45 milliseconds; AA interval:  256  milliseconds; PR interval:  N/A; QRS duration:  108 milliseconds; QT  interval:  N/M; AH interval:  N/A; HV interval:  54 milliseconds.  Final:  Rhythm:  Sinus; RR interval:  874 milliseconds; PR interval:  353 milliseconds; QRS duration:  94 milliseconds; QT interval:  431  milliseconds; P-wave duration:  180 milliseconds; bundle-branch block:  Absent; pre-excitation:  Absent.  AH interval:  233 milliseconds; HV interval:  40 milliseconds.  AV nodal function:  AV Wenckebach was 700 milliseconds.  VA conduction was associated at 600 milliseconds.  AV nodal effective refractory period at a paced cycle length of 800  milliseconds was 560 milliseconds and antegrade conduction was  continuous.   ACCESSORY PATHWAY  FUNCTION:  No evidence of an accessory pathway was  identified.   VENTRICULAR RESPONSE TO PROGRAMMED STIMULATION:  Normal ventricular  stimulation as noted.   Arrhythmias induced.   The patient presented to the lab in atrial flutter.  Electrogram mapping  demonstrated clockwise rotation around the tricuspid annulus.  Entrainment mapping confirmed isthmus dependence.  At this point, the  electroanatomical array was inserted to allow for careful mapping as the  patient's cavotricuspid isthmus was very long indeed and characterized  very proximal steep ridge.   FLUOROSCOPY TIME:  A total of 19 minutes and 41 seconds of fluoroscopy  time was utilized at 7.5 frames per second.   RADIOFREQUENCY ENERGY:  A total of 7 minutes and 11 seconds of RF energy  was applied at multiple applications across the cavotricuspid  isthmus.  Initial applications were placed at about 5 o'clock.  There was a very  prominent ridge as noted above quite proximally in the isthmus.  This  site was actually ablated from both directions, and a retroflexed  catheter was ultimately needed to ablate the atrial side of this ridge  and it was with this successful conduction block was accomplished.   IMPRESSION:  1. Sinus bradycardia, but improving.  2. Abnormal atrial function manifested by sustained atypical (reverse      typical) atrial flutter.  The anatomy of the cavotricuspid isthmus      was notable as above.  3. Atrioventricular nodal function is abnormal with prolonged      antegrade conduction through the atrioventricular node as suggested      by the high-grade block during atrial flutter.  4. Normal His-Purkinje system function.  5. No accessory pathway.  6. Normal ventricular response to programmed stimulation.   SUMMARY AND CONCLUSIONS:  The results of electrophysiological testing  confirmed atrial flutter that was isthmus dependent.  It was reverse  typical.  Catheter ablation successfully eliminated the tachycardia and  the substrate for the patient's arrhythmia.   The patient will be watched overnight.  He will be maintained on  Coumadin for approximately 4 weeks.   We will need to keep an eye on his conduction and there may well be some  functional impairment related to his very abnormal antegrade conduction  which pacing may rectify.      Duke Salvia, MD, [facility]  Electronically Signed     SCK/MEDQ  D:  03/02/2009  T:  03/03/2009  Job:  [id number]

## 2011-01-25 NOTE — Assessment & Plan Note (Signed)
[facility]                            CARDIOLOGY OFFICE NOTE   NAME:FIELDSDaryn, [name]                       MRN:          [id number]  DATE:10/15/2007                            DOB:          1941/02/18    [name] returns today for follow-up.   I have seen him for coronary artery disease.  He has had a previous  complex intervention of the ostial circ.  Had a nonischemic Myoview in  August 2008.   [name] tends to ask me many questions regarding his general medical care.  He has quite a few problems.  In regards to his heart, he is not having  chest pain.  His Plavix was stopped for about 5 days for surgery.  He  had a ventral hernia repair by Dr. Abbey Chatters and also had a plate  removed from his right arm with olecranon nerve surgery by Dr. [name].  He has not had any complications from the surgery or significant chest  pain.  In regards to his arm the rehab is somewhat slow.  He still has  some paresthesias in his right hand and some weakness with his grip but  is improving.  Regards to his ventral hernia it seems to be healing  well.  He still has a small area that he has a Band-Aid on that his slow  to heal but there is no signs of infection.  He is not on antibiotics.   Had a friend talk with [name] [name] last time regarding his alcoholism.  He  continues to drink wine, but has cut out all his beer.  He is anxious to  get his LFTs rechecked.  Back in August 2008 his SGOT was 86.   The the patient also has significant varicose veins.  He needs to follow  up with Dr. Sondra Come.   They seem to be improved since he has stopped drinking.  I suspect that  this has to do with fluid intake.   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  He asked me if glucosamine was fine and I told him that it works  for some people and there is no contraindications to it.  Review of  systems otherwise negative.   His medications include  atenolol 25 a day, Plavix 75 a day and aspirin a  day, glucosamine, omega, vitamins and hydrochlorothiazide 25 a day.   EXAM:  Remarkable for elderly white male in no distress.  Weight is 211, blood pressure is 130/70, pulse 55 and regular, afebrile  respiratory rate 14.  HEENT:  Unremarkable.  Carotids are without bruit, no lymphadenopathy, JVP elevation.  LUNGS:  Clear diaphragmatic motion.  No wheezing.  S1-S2 with normal heart sounds.  PMI normal.  Abdomen is remarkable for recent ventral hernia repair.  Healing well.  No AAA no tenderness, No hepatosplenomegaly or hepatojugular reflux.  No  bruit.  Distal pulse intact.  He has improved varicose veins bilaterally with  trace edema.  NEURO:  Nonfocal.  SKIN:  Warm and dry.  He does have some paresthesias in the  ulnar distribution in the right  hand.  He has a scar on the medial aspect of his olecranon from his  surgery.   His EKG shows sinus bradycardia rate of 55 first-degree AV block with a  PR interval of approximately 240 milliseconds.   IMPRESSION:  1. Coronary disease, previous complex intervention the circ continue      aspirin and Plavix do not increase atenolol any further given      relative bradycardia.  He will call me if there is any evidence of      chronotropic incompetence or high-grade heart block.  2. Recent ventral hernia repair follow with Dr. Maurie Boettcher seems to      be healing well back on Plavix no evidence of infection.  3. Recent olecranon neurosurgery would need for continued rehab follow      up Dr. Cheree Ditto make increased range of motion and strength exercises      in the hand.  4. Varicose veins.  Continue low-dose diuretic follow-up with Dr. Sondra Come      would be a candidate for sclerosis.  5. Alcoholism cutting back follow-up lipid and liver test in the next      week or two he was not fasting today.  6. Hyperlipidemia in the setting of coronary disease, currently not on      any statin drugs due  to elevated LFTs.  If his LFTs improved with      his decrease alcohol intake, we can consider starting low-dose      statin therapy.   Overall [name] is doing better.  He will need a follow-up stress test in  the year.     [name] Pick. Eden Emms, MD, [facility]  Electronically Signed    PCN/MedQ  DD: 10/15/2007  DT: 10/15/2007  Job #: [id number]

## 2011-01-25 NOTE — Discharge Summary (Signed)
NAMEQUENTIN, [name]:  [id number]   MEDICAL RECORD NO.:  [id number]          PATIENT TYPE:  INP   LOCATION:  2508                         FACILITY:  [facility]   PHYSICIAN:  Duke Salvia, MD, FACCDATE OF BIRTH:  03-01-1941   DATE OF ADMISSION:  03/02/2009  DATE OF DISCHARGE:  03/03/2009                               DISCHARGE SUMMARY   This patient has intolerance to STATINS which causes elevated LFTs.   Time of this dictation greater than 35 minutes.   FINAL DIAGNOSES:  1. 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  2. Electrophysiology study on March 02, 2009, with finding of clockwise      rotation of atrial flutter waves around the tricuspid annulus and      successful elimination of the substrate atrial flutter, Dr. Sherryl Manges.   SECONDARY DIAGNOSES:  1. History of coronary artery disease.  He underwent catheterization      in January 2005 with a non-drug-eluting stent to the left      circumflex and percutaneous transluminal coronary angioplasty of      the ostial first obtuse marginal.  This study was done for      exertional angina.  2. Obstructive sleep apnea.  3. A repeat Myoview study in August 2009, low-risk study, mild      inferior wall ischemia.  Ejection fraction 71%.  4. Hypertension.  5. Varicose veins.  6. Family history of coronary artery disease.   PROCEDURE:  On March 02, 2009, electrophysiology study with a finding of  a clockwise reversed typical atrial flutter with successful elimination  of atrial flutter substrate at the tricuspid annulus, Dr. Sherryl Manges.  The patient had no post-procedural complications.  There was noted some  prolonged antegrade conduction through the AV node.  He has been  monitored on tele and will have exercise stress test in the future to  assess for chronotropic incompetence, I believe.   BRIEF HISTORY:  [name] [name] is a 70 year old male.  He presents with  symptomatic atrial flutter.  He does feel palpitations.  He gets  fatigued and short of breath with any sustained exercise.  The patient  is on Coumadin therapy and presents for atrial flutter ablation by Dr.  Sherryl Manges.  It is thought from looking at the electrocardiogram as  this is an atypical or a reversed typical atrial flutter.   HOSPITAL COURSE:  The patient presents electively on March 02, 2009.  He  underwent successful electrophysiology study and radiofrequency catheter  ablation same day.  Once again, antegrade conduction to the AV node was  abnormal and prolonged and this has been monitored during the overnight.  The patient ready for discharge postprocedure day #1.  The patient was  discharged on his preoperative medications which are as follows:  1. Enteric-coated aspirin 81 mg daily.  2. Atenolol 25 mg tablets 1/2 tablet daily.  3. Multivitamin daily.  4. Vitamin C 1000 mg daily.  5. Lovaza 1 g daily.  6. Coumadin.  He is to  take this as he has been taking prior to this      admission.   He follows up at [facility], [address]  [place] to see Dr. Graciela Husbands and do a treadmill test on Wednesday, March 18, 2009, at 3:15.  Protime on the day of discharge on March 03, 2009, is  26.2 and INR is 2.2.  Basic metabolic panel on lab work on February 27, 2009, was sodium 135, potassium 4.2, chloride 100, carbonate 27, glucose  115, BUN is 7, and creatinine 0.7.      Maple Mirza, [place]      Duke Salvia, MD, [facility]  Electronically Signed    GM/MEDQ  D:  03/03/2009  T:  03/03/2009  Job:  [id number]   cc:   [name], MD, [facility]  Vale Haven. Andrey Campanile, M.D.  Barbaraann Share, MD,FCCP

## 2011-01-25 NOTE — Assessment & Plan Note (Signed)
[facility]                            CARDIOLOGY OFFICE NOTE   NAME:FIELDSShaquon, Gropp                       MRN:          [id number]  DATE:05/03/2007                            DOB:          06-21-1941    Brazos returns today for follow up. Since I last saw him, he has had two  episodes of chest pain, but has not needed the nitroglycerin. He is  status post complex intervention to the ostial circumflex and left main.   The patient had a non-ischemic Myoview on 05/22/2006.   He is ambulatory. In general, he is not getting exertional chest pain.   He has nitroglycerin in case he needs it.   In terms of his risk factor modification, he is not on a statin drug.  Unfortunately, Kahli drinks 3 to 5 beers a day and a glass of wine. When  we last checked, he had abnormal LFTs which would indicate some degree  of cirrhosis. I had a long discussion with Trip about this and I  explained to him that he needs to follow up with Dr. Artis Flock, and try to  back off on his alcohol intake. It is my policy not to institute statin  drug therapy in people who have any evidence of cirrhosis with LFT  abnormalities.   He understands that this can make the progression of his coronary artery  disease worse.   The patient also has some mild chronic lower extremity edema. He did see  Dr. Donia Ast at the vein clinic. He has chosen to wear compression hose  for the time being and not have sclerosis. He has not had to take his  Lasix in over a month. He knows to watch the salt in the diet. He has  not had any significant pain or phlebitis in his legs.   The patient does have a small ventral hernia. He may need surgery for  this. He has an appointment with Dr. Purnell Shoemaker on Monday. I told him  that I think we can clear him for surgery, but I would like to know an  exact date. It may be worthwhile to do a follow up Myoview since it has  been about a year since his last one. The  patient has otherwise been  compliant with his medications and is already on a beta blocker. I  suspect that he could stop his Plavix 5 days before any surgery.   In regards to shortness of breath, this is at baseline. He has sleep  apnea and has been seeing Dr. Shelle Iron. The pressure support on his CPAP  has been increased. He has been wearing this for about two years and is  having good results.   He knows that wearing the mask is important in regards to his coronary  disease and decreasing nighttime desaturations.   REVIEW OF SYSTEMS:  His review of systems is otherwise negative.   MEDICATIONS:  1. Atenolol 25 daily.  2. Plavix 75 daily.  3. Glucosamine.  4. Aspirin daily.  5. Lasix p.r.n.   PHYSICAL EXAMINATION:  GENERAL:  Healthy appearing  middle aged white  male in no distress. Affect is appropriate. He has tattoos on his left  shoulder.  VITAL SIGNS:  Weight 224, blood pressure 145/86, pulse 64 and regular,  respiratory rate is 14.  HEENT:  Normal.  NECK:  Carotids normal without bruit. There is no lymphadenopathy or  thyromegaly. No JVP elevation.  LUNGS:  Clear with good diaphragmatic motion and no wheezing.  CARDIAC:  There is an S1, S2 with normal heart sounds. PMI is normal.  ABDOMEN:  Protuberant. There is a small ventral hernia. No AAA. Bowel  sounds positive. No tenderness. No hepatosplenomegaly. No hepatojugular  reflux. Liver is not firm.  EXTREMITIES:  Femorals are +3 bilaterally without bruits. Posterior  tibialis pulses are +3. He has significant varicosities in both lower  extremities with mild edema.  NEUROLOGIC:  Non-focal. There is no muscular weakness.  SKIN:  Warm and dry.   His EKG at baseline has Q waves in III and F.   His LFTs done August 13th showed an SGOT of 86 and an SGPT of 52  consistent with an alcoholic pattern. His cholesterol was 151 with an  LDL of 82.   IMPRESSION:  1. Coronary disease, complex intervention to the ostial  circumflex,      continue aspirin and Plavix. There has been only one episode of      angina. He will also continue his Atenolol at 25 mg daily.  2. Shortness of breath, stable. Likely related to sleep apnea.      Continue CPAP with new settings. Follow up with Dr. Shelle Iron.  3. Preoperative clearance for possible ventral hernia surgery. He is      cleared for surgery. His Plavix can be stopped 5 days before the      procedure if need be. I would like to know if this gets scheduled      as I may perform a Myoview prior to this since it has been over a      year.  4. Alcohol intake with elevated SGOT. Follow up with Dr. Artis Flock. Cut      back on his alcohol and wine intake.  5. Total cholesterol 151 with an LDL of 82, particularly in light of      his elevated LFTs from his alcoholism, we will not start him on a      statin drug.  6. Lower extremity edema from varicose veins. Continue compression      hose, low salt diet, p.r.n. Lasix. He will follow up with Dr.      Donia Ast, but currently he does not want any scleral therapy.    [name], MD, [facility]  Electronically Signed   PCN/MedQ  DD: 05/03/2007  DT: 05/04/2007  Job #: [id number]

## 2011-01-28 NOTE — Assessment & Plan Note (Signed)
[facility]                              CARDIOLOGY OFFICE NOTE   NAME:FIELDSDevion, Chriscoe                       MRN:          [id number]  DATE:06/22/2006                            DOB:          1941/07/28    Mr. Heggs was seen today in followup. He has a history of coronary disease  with previous angioplasty to the circ and right. These were patent by cath  in February of 2006.   I saw the patient recently with a nonischemic stress test in September. He  has been having increasing lower extremity edema, he has significant venous  insufficiency of the greater saphenous veins bilaterally. He has an  appointment to see Dr. Donia Ast.   REVIEW OF SYSTEMS:  Remarkable for continued moderate to high alcohol  consumption. I told him to talk to Dr. Artis Flock about using Topamax to help  with decreasing his alcohol intake.   The added liquid and salt content is certainly not helping his lower  extremity from reflux.   He has been compliant with his aspirin and Plavix. He is not having  significant chest pain, PND or orthopnea. He has chronic lower extremity  edema which is better at nigh, however, his weight is up and he still has +1  to 2 edema with some brawny induration. I told him I would switch his  hydrochlorothiazide which I started last visit to 20 of Lasix.   PHYSICAL EXAMINATION:  He looks well. The blood pressure is 134/79, better  now that he is on a diuretic. Pulse 65 and regular. Lungs are clear. There  is no thyromegaly, there is no lymphadenopathy. Pupils are equal round and  reactive to light and accommodation. There is normal S1 and S2 with normal  heart sounds. Abdomen is benign. There is no hepatojugular reflex. There is  no abdominal aortic aneurysm.  Distal pulses are intact with +2 edema bilaterally.   MEDICATIONS:  1. Atenolol 25 a day.  2. Plavix 75 a day.  3. Aspirin a day.  4. Lasix 20 a day.   IMPRESSION:  Stable coronary  artery disease, no angina, nonischemic Myoview  recently.   Continue aspirin and Plavix. The patient has had significant leg pains with  statins and we do not have him on one at this time. He has significant  venous reflux at both greater saphenous veins by duplex and is to see Dr.  Donia Ast  next week to see if there is any therapy for this. We  will continue to monitor his weight, salt intake and try to find out what  the best diuretic dose is for him. He will have his potassium checked a  month after being on Lasix.            ______________________________  [name] Eden Emms, MD, [facility]     PCN/MedQ  DD:  06/22/2006  DT:  06/23/2006  Job #:  [id number]   cc:   Quita Skye. Artis Flock, M.D.

## 2011-01-28 NOTE — Cardiovascular Report (Signed)
NAMECRUZE, ZINGARO NO.:  [id number]   MEDICAL RECORD NO.:  [id number]                   PATIENT TYPE:  INP   LOCATION:  6525                                 FACILITY:  [facility]   PHYSICIAN:  Veneda Melter, M.D.                   DATE OF BIRTH:  1941-02-08   DATE OF PROCEDURE:  10/08/2003  DATE OF DISCHARGE:  10/09/2003                              CARDIAC CATHETERIZATION   PROCEDURES PERFORMED:  1. Direction coronary atherectomy with adjuvant angioplasty and stent     placement in the mid atrioventricular circumflex.  2. Perclose right femoral artery.   CARDIOLOGIST:  Veneda Melter, M.D.   ASSISTANT:  Salvadore Farber, M.D.   DIAGNOSES:  1. Coronary atherosclerotic disease.  2. Unstable angina.   HISTORY:  Mr. Dinovo is a 70 year old white male who presents with unstable  angina.  The patient underwent cardiac catheterization by Dr. Dorethea Clan showing  severe narrowing of at least 80% in the mid AV circumflex and immediately  after the takeoff of the large first marginal branch.  The circumflex was  the dominant vessel.  He has well-preserved LV function.  He has been  pretreated with Plavix and heparin and presents now for percutaneous  intervention.   DESCRIPTION OF PROCEDURE:  Informed consent was obtained.  The patient was  brought to the catheterization lab.  An [place] sheath was placed in the  right femoral artery using the modified Seldinger technique.  The patient  was given systemic heparin and Integrilin to maintain the ACT of greater  than 300 seconds.  He had been pretreated with aspirin and Plavix.  Plans  were then initiated for direction coronary atherectomy and initially an 8  [place] JL4 guide catheter was used to engage the left coronary artery.  Due  to guide support, eventually we switched out to a Voda left 4 and a soft  wire was positioned in the first marginal branch for protection while a Bran  son was positioned in the distal  circumflex.  A 3.5 to 4.0 Flexi cut was  introduced.  However, this could not be passed into the vessel and a 3.0 to  3.4 Flexi cut was introduced and could be passed into the mid AV circumflex.  The wound was inflated on two occasions and several passes made within the  mid AV circumflex.  Repeat angiography showed modest improvement in the  vessel lumen and diameter.  There was mild vessel disruption which appeared  somewhat shaggy but no evidence of dissection or full limitation.  A 3.5 x  10 mm cutting balloon was then introduced and used to further dilate the mid  AV circumflex at 6 atmospheres for 45 seconds.  Again, significant  improvement in vessel lumen was noted.  However, there was residual stenosis  of at least 50% due to recoil.  A 4.0 x 18 mm Driver MX stent  was then  introduced and positioned in the proximal AV circumflex extending into the  mid section of the vessel straddling the first marginal branch.  Deployment  was at 16 atmospheres for 30 seconds.  Repeat angiography was then performed  showing an excellent result with full coverage of the lesion, no residual  stenosis and no compromise of flow to the first marginal branch.  Final  angiography was performed in various projections after the administered of  intracoronary nitroglycerin and verapamil showing no distal vessel damage,  TIMI-3 flow to the left coronary system and no residual stenosis.  The guide  catheter was then removed as was the sheath and a Perclose suture closure  deployed to the right femoral artery.  Adequate hemostasis was achieved.  The patient was transferred to the floor in the stable condition.  He  tolerated the procedure well.   FINAL RESULT:  Successful percutaneous intervention in the mid  atrioventricular circumflex with reduction of 80% narrowing to 0%, placement  of 4.0 x 18 mm Driver stent.   ASSESSMENT AND PLAN:  Mr. Yonkers is a 70 year old with severe single vessel  coronary artery  disease noted on percutaneous intervention.  Aggressive  medical therapy and risk factor modification will be pursued.  He will be  placed on Plavix for a minimum of one month's time.                                               Veneda Melter, M.D.    NG/MEDQ  D:  10/08/2003  T:  10/09/2003  Job:  [id number]   cc:   Delsa Grana. Andrey Campanile, M.D.  [address]  [place]  [place] [address]  Fax: [phone]   Charlton Haws, M.D.

## 2011-01-28 NOTE — H&P (Signed)
NAME:  [name], MANO NO.:  [id number]   MEDICAL RECORD NO.:  [id number]                   PATIENT TYPE:  EMS   LOCATION:  MAJO                                 FACILITY:  [facility]   PHYSICIAN:  Charlton Haws, M.D.                  DATE OF BIRTH:  May 08, 1941   DATE OF ADMISSION:  10/07/2003  DATE OF DISCHARGE:                                HISTORY & PHYSICAL   [name] is a 70 year old patient brought to the ER by Dr. Andrey Campanile for  exertional chest pain.  The pain does sound anginal.  He has been having it  for the last four to five days and it clearly comes on with exertion and  stops when he rests.  It has a sharp component to it and he has had previous  left shoulder problems, but this is different.   It is accompanied by occasional dyspnea.   He has been under a little bit of stress.  He went rabbit hunting four days  ago and lost two of his dogs.   This is when he noted the exertional chest pain and dyspnea worsening.   He has no known allergies.   He is only no aspirin and garlic tablets.   He is a long-time smoker.  He also chews smokeless tobacco.   There is a history of ETOH use which may be somewhat excessive.  He has been  married for 44 years.  His wife is coming to the emergency room.  He is self-  employed doing Manufacturing engineer.   He enjoys hunting.   There is a family history for coronary artery disease.   There is a questionable history of obstructive sleep apnea, but this has not  been formally worked up.   PHYSICAL EXAMINATION:  VITAL SIGNS:  On examination, the blood pressure is  149/83, pulse is 72 and regular.  CHEST:  Lungs are clear.  NECK:  Carotids normal.  CARDIAC:  There is an S1, S2, normal heart sounds.  ABDOMEN:  Benign.  EXTREMITIES:  Lower extremities with intact pulse, no edema.   Chest x-ray is normal.  EKG is normal.   Labs are pending except for the creatinine, which is 0.8.   IMPRESSION:  I had a  long discussion with the patient.  His symptoms are  worrisome for exertional angina.  I think that is why Dr. Andrey Campanile actually  drove him to the emergency room himself.  Although his EKG is unremarkable,  there is no other reason for him to be having this exertional pain.  Given  his risk factors, particularly long-standing smoking, I think it is  reasonable to proceed with heart catheterization.  The risks including  stroke, bleeding, need for emergency surgery, dye reaction, and kidney  problems, were discussed with the patient and he is willing to proceed.  We  will place the patient on  heparin.  His resting heart rate currently is 64,  and I do not think he needs beta blockers.  I called the catheterization lab  and I think we may be able to do his heart catheterization today.  He has  not eaten since last night.                                                Charlton Haws, M.D.    PN/MEDQ  D:  10/07/2003  T:  10/07/2003  Job:  [id number]   cc:   Vale Haven. Andrey Campanile, M.D.  [address]  [place]  [place] [address]  Fax: [phone]

## 2011-01-28 NOTE — Assessment & Plan Note (Signed)
[facility]                             PULMONARY OFFICE NOTE   NAME:FIELDSMaximillian, [name]                       MRN:          [id number]  DATE:08/22/2006                            DOB:          [birth year]    SLEEP MEDICINE CONSULTATION   HISTORY OF PRESENT ILLNESS:  Patient is a 70 year old male who I have  been asked to see for obstructive sleep apnea.  Patient was initially  diagnosed with severe sleep apnea in 2006, where he was found to have a  respiratory disturbance index of 60 events per hour, and an O2  saturation as low as 74%.  He underwent titration, which surprisingly  only required 7 cm of water pressure for maintenance.  Patient was  placed on the CPAP, and he thinks he did see some improvement.  Patient,  although a little better, thinks that he is not sleeping as well as he  should.  Currently is on a full face mask with a heated humidifier.  He  typically gets to bed between 8 and 9:30, and gets up between 5 and 6:30  to start his day.  On some days he is rested, others he is not.  He  states that his weight is neutral from his original sleep study.  He  feels that his alertness level is fairly good in the mornings; however,  after lunch he begins to have daytime sleepiness and will often take a  nap.  His primary sleep complaint at this time is awakenings during the  night and thinks this may be because of nocturia.   PAST MEDICAL HISTORY:  1. Significant for coronary artery disease and status post      percutaneous intervention/stent.  2. History of sleep apnea as stated above.   CURRENT MEDICATIONS:  1. Atenolol 25 mg daily.  2. Plavix 75 mg daily.  3. Multivitamin daily.  4. Aspirin 81 mg daily.  5. Claritin p.r.n.  6. Lasix 20 mg daily.   The patient has no known drug allergies.   SOCIAL HISTORY:  He is married and he has children.  He has a history of  smoking on and off for 20 years at 1 pack per day.  He has not smoked  in  10 years.   FAMILY HISTORY:  Remarkable for father and brother having heart disease.  Father also having prostate cancer.   REVIEW OF SYSTEMS:  As per history of present illness, also see patient  intake form documented on the chart.   PHYSICAL EXAMINATION:  In general, he is a well-developed male in no  acute distress.  Blood pressure 118/74, pulse 83, temperature is 98.5,  weight is 214 pounds, O2 saturation on room air is 97%.  HEENT:  Pupils are equal, round, and reactive to light and  accommodation.  Extraocular muscles intact.  Nares show deviated septum  to the left.  Oropharynx shows moderate elongation of soft palate and  uvula.  NECK:  Supple without JVD or lymphadenopathy.  There is no palpable  thyromegaly.  CHEST:  Totally clear.  CARDIAC EXAM:  Reveals regular rate and rhythm, no murmurs, rubs, or  gallops.  ABDOMEN:  Soft, nontender with good bowel sounds.  GENITAL EXAM, RECTAL EXAM, BREAST EXAM:  Not done and not indicated.  LOWER EXTREMITIES: Shows 1+ edema, pulses are intact distally.  NEUROLOGICALLY:  He is alert and oriented with no obvious motor  deficits.   IMPRESSION:  History of severe obstructive sleep apnea.  Although the  patient is doing better on CPAP, he clearly feels that his alertness  level and sleep efficiency are not adequate.  I am concerned that  because of the degree of his sleep apnea that his CPAP pressure is too  low.  I would be very surprised if that degree of pressure would be able  to maintain him.  The thing at this point in time is that he needs to  get a new mask because this may be leaking and it is quite old, and  should also undergo autoset testing for pressure optimization.  The  patient is agreeable to this approach.   PLAN:  1. Obtain new mask for patient.  2. Autoset device for 2 weeks with download, and I will let the      patient know his optimal pressure.  3. Work aggressively on weight loss.  4. If the patient is  doing well, I will see him in 6 months, or sooner      if he feels the new pressure change is not adequately treating his      sleep apnea.     Barbaraann Share, MD,FCCP  Electronically Signed    [facility]/MedQ  DD: 09/22/2006  DT: 09/23/2006  Job #: [id number]   cc:   [name], MD, [facility]  Quita Skye. Artis Flock, M.D.

## 2011-01-28 NOTE — Cardiovascular Report (Signed)
NAME:  [name], [name]:  [id number]   MEDICAL RECORD NO.:  [id number]                   PATIENT TYPE:  INP   LOCATION:  1844                                 FACILITY:  [facility]   PHYSICIAN:  Vida Roller, M.D.                DATE OF BIRTH:  [birth year]   DATE OF PROCEDURE:  10/07/2003  DATE OF DISCHARGE:                              CARDIAC CATHETERIZATION   PRIMARY CARE Abiola Behring:  Dr. Margrett Rud.   CARDIOLOGIST:  Dr. Maurine Cane.   PROCEDURES PERFORMED:  1. Left heart catheterization.  2. Selective coronary angiography.  3. Left ventriculography.   HISTORY OF PRESENT ILLNESS:  [name] is a 70 year old gentleman who has  unknown lipid status and mild hypertension.  He presented to the emergency  department complaining of chest discomfort which was primarily exertional.  He was taken to the cardiac catheterization laboratory directly from the  emergency department due to the severity of his symptoms.  He underwent left  heart catheterization.   DETAILS OF THE PROCEDURE:  After obtaining informed consent the patient was  brought to the cardiac catheterization laboratory in the fasting state.  There he was prepped and draped in the usual sterile manner, and the right  groin was anesthetized using 1% lidocaine without epinephrine. The right  femoral artery was cannulated using the modified Seldinger technique with a  6-French, 10-cm sheath, and left heart catheterization was performed using a  6-French Judkins left #4, a 6-French Judkins right #4, and a 6-French angled  pigtail catheter.  The pigtail catheter was used for left ventriculography  which was imaged in a 30-degree RAO view with a power injector.  At the  conclusion of the procedure the catheters were removed.  The patient was  moved back to the cardiology holding area where the femoral artery sheath  was removed.  Hemostasis was obtained using direct manual pressure.  At the  conclusion of the hold there was no evidence of ecchymosis or hematoma  formation, and the distal pulses were intact.  Total fluoroscopic time 4.8  minutes.  Total ionized contrast 100 mL.   RESULTS:  1. Aortic pressure 137/66 with a mean arterial pressure of 98 mmHg.  2. Left ventricular pressure 137/7 with an end-diastolic pressure of 13     mmHg.   CORONARY ANGIOGRAPHY:  1. The right coronary artery is a small nondominant vessel which     angiographically has nonobstructive disease.  2. The left main coronary artery is a large vessel which is angiographically     normal.  3. The left anterior descending coronary artery is a moderate-caliber vessel     which has 2 diagonal branches.  There is nonobstructive disease in the     left anterior descending coronary system.  4. The left circumflex coronary artery is a large dominant vessel which has     a large bifurcating obtuse marginal.  Just distal to the obtuse marginal     there is a 95% lesion involving the ostium of the obtuse marginal.  The     remainder of the left circumflex coronary artery includes 2     posterolateral branches and a moderate-caliber posterior descending     coronary artery which have luminal irregularities.  5. The left ventriculogram reveals an ejection fraction of 60% with no wall     motion abnormalities and no mitral regurgitation.   ASSESSMENT:  Single-vessel coronary disease in a left-dominant circumflex  distribution.   PLAN:  Admit the patient, give him a Plavix load, and plan for percutaneous  coronary intervention of the circumflex coronary artery in the morning.                                               Vida Roller, M.D.    JH/MEDQ  D:  10/07/2003  T:  10/08/2003  Job:  [id number]

## 2011-01-28 NOTE — Assessment & Plan Note (Signed)
[facility]                              CARDIOLOGY OFFICE NOTE   NAME:FIELDSShaye, [name]                       MRN:          [id number]  DATE:05/22/2006                            DOB:          20-Jul-1941    [name] was seen today in followup.  He is status post circumflex  intervention.  He had a stress test today which was nonischemic.  He was  somewhat hypertensive with exercise.   He says he follows his blood pressure from time to time at home, and it  usually runs fine; however, he has been having increasing lower extremity  edema.  I told him it would be reasonable for him to start  hydrochlorothiazide, both for his blood pressure and for his lower extremity  edema.   We will also have him do a venous duplex, since I suspect he has some venous  insufficiency.  He has significant sleep apnea and may have some elevated  right-sided pressures contributing to this as well.  He has been compliant  with his aspirin and Plavix.   He was told to watch his salt intake by Dr. Artis [name].   PHYSICAL EXAMINATION:  GENERAL:  He looks well.  VITAL SIGNS:  Blood pressure 140/80, pulse 70 and regular.  LUNGS:  Clear.  CARDIAC:  Carotids are normal.  There is an S1 and S2 with normal heart  sounds.  ABDOMEN:  Benign.  EXTREMITIES:  He has +1-2 edema in the right leg and +1 edema in the left  leg.  He has some chronic arthritis in the right knee.  Distal pulses are  intact.   MEDICATIONS:  Listed in the chart.   IMPRESSION:  Stable coronary disease, nonischemic Myoview with good left  ventricular function, borderline hypertension with lower extremity edema.  Start hydrochlorothiazide 25 a day.  Refills on atenolol and Plavix were  given.  He will follow up with Dr. Artis [name] for his general medical needs.  I  will see him back in four weeks to recheck a BMET and to see how his edema  is doing.  If he has significant venous insufficiency by ultrasound, he may  benefit from further therapy for this.                               [name], MD, [facility]    PCN/MedQ  DD:  05/22/2006  DT:  05/22/2006  Job #:  [id number]   cc:   [name], M.D.

## 2011-01-28 NOTE — Procedures (Signed)
[facility]  Patient:    [name] Visit Number: [id number] MRN: [id number]          Service Type: END Location: ENDO Attending Physician:  Orland Mustard Dictated by:   Llana Aliment. Randa Evens, M.D. Proc. Date: 08/22/01 Admit Date:  08/22/2001   CC:         Duffy Rhody C. Andrey Campanile, M.D.   Procedure Report  DATE OF BIRTH:  August 02, 1941  PROCEDURE:  Colonoscopy.  MEDICATIONS:  Fentanyl 100 mcg, Versed 10 mg IV.  SCOPE:  Adult Olympus video colonoscope.  INDICATION:  The patient has had a previous history of adenomatous polyps. This is done a five year follow-up.  DESCRIPTION OF PROCEDURE:  The procedure had been explained to the patient and consent obtained.  The patient in the left lateral decubitus position, the Olympus adult video colonoscope was inserted and advanced under direct visualization.  The prep was excellent.  We were able to reach the cecum without difficulty.  The ileocecal valve and appendiceal orifice were seen. The scope was withdrawn, and the cecum, ascending colon, hepatic flexure, transverse colon, splenic flexure, descending and sigmoid colon were seen well.  Marked diverticular disease in the sigmoid colon.  No other abnormalities.  The scope withdrawn.  The patient tolerated the procedure well.  ASSESSMENT: 1. Diverticulosis. 2. No further polyps.  PLAN:  We will recommend repeating in five years due.  Routine follow-up with yearly hemoccults. Dictated by:   Llana Aliment. Randa Evens, M.D. Attending Physician:  Orland Mustard DD:  08/22/01 TD:  08/22/01 Job: [id number]

## 2011-01-28 NOTE — Op Note (Signed)
NAME:  [name], [name]                          ACCOUNT NO.:  [id number]   MEDICAL RECORD NO.:  [id number]                   PATIENT TYPE:  AMB   LOCATION:  ENDO                                 FACILITY:  [facility]   PHYSICIAN:  Lambert L. Malon Kindle., M.D.          DATE OF BIRTH:  [birth year]   DATE OF PROCEDURE:  03/22/2004  DATE OF DISCHARGE:                                 OPERATIVE REPORT   PROCEDURE:  Colonoscopy.   ENDOSCOPIST:  Llana Aliment. Edwards, M.D.   MEDICATIONS:  Fentanyl 70 mcg, Versed 7 mg IV.   SCOPE:  Olympus pediatric colonoscope.   INDICATIONS FOR PROCEDURE:  Rectal bleeding in a 70 year old.   DESCRIPTION OF PROCEDURE:  The procedure had been explained to the patient  and a consent obtained.  With the patient in the left lateral decubitus  position, the scope was inserted and advanced.  We were able to advance  easily to the cecum where the cecal valve and appendiceal orifice were seen.  The scope was withdrawn.  The cecum, ascending colon, transverse colon,  descending and sigmoid colon were seen well.  No polyps or other lesions  were seen.  Marked diverticular disease of the sigmoid colon without active  bleeding.  The rectum was free of polyps.  There were internal hemorrhoids  seen on the retroflexed view of the rectum.  The scope was withdrawn.  The patient tolerated the procedure well.   ASSESSMENT:  1. Rectal bleeding - code #578.1, probably due to internal hemorrhoids.  2. Internal hemorrhoids - code 455.0.  3. Moderate diverticulosis of the sigmoid colon - code #562.10.  4. History of adenomatous colon polyps with no polyps found on this     colonoscopy - code #V12.72.   DISPOSITION:  Will give a hemorrhoid instruction sheet and see him back as  needed.  Due to his history of adenomatous polys, will plan on repeating the  colon in five years.                                               Elmin L. Malon Kindle., M.D.    Waldron Session  D:  03/22/2004  T:   03/22/2004  Job:  [id number]   cc:   Duffy Rhody C. Andrey Campanile, M.D.  [address]  [place]  [place] [address]  Fax: [phone]

## 2011-01-28 NOTE — Cardiovascular Report (Signed)
[name], [name]:  [id number]   MEDICAL RECORD NO.:  [id number]                   PATIENT TYPE:  INP   LOCATION:  6527                                 FACILITY:  [facility]   PHYSICIAN:  Salvadore Farber, M.D.             DATE OF BIRTH:  1941-09-10   DATE OF PROCEDURE:  10/17/2003  DATE OF DISCHARGE:  10/18/2003                              CARDIAC CATHETERIZATION   PROCEDURES PERFORMED:  1. Coronary angiography.  2. Kissing balloon angioplasty of the circumflex and obtuse marginal-1.   CARDIOLOGIST:  Salvadore Farber, M.D.   INDICATIONS:  [name] is a 70 year old gentleman who presented October 07, 2003 with unstable angina.  Catheterization demonstrated a 95% stenosis  of the dominant AV groove circumflex just distal to the takeoff of a very  large first obtuse marginal branch.  Dr. Chales Abrahams performed directional  atherectomy with subsequent stenting of the AV groove circumflex jailing the  obtuse marginal.  Angiography in multiple views demonstrated no significant  stenosis of the jailed obtuse marginal.   Since that procedure [name] has had intermittent chest discomfort  occurring both at rest and on exertion.  Chest pain, while atypical, is very  similar to his presenting complaint on January 25th.  He is therefore  referred for diagnostic angiography.   PROCEDURAL TECHNIQUE:  Informed consent was obtained.  Under 1% lidocaine  local anesthesia a 6 French sheath was placed in the left femoral artery  using the modified Seldinger technique.  Diagnostic angiography was  performed using JL-4 and JR-4 catheters.  This demonstrated a 75% stenosis  of the ostium of OM-1.  This was not evident in the angulations taken at the  time of the angioplasty, but was evident in alternative views.  Decision was  made to perform kissing balloon angioplasty of this stenosis because of his  recurrent symptoms.   Anticoagulation was initiated with  bivalirudin.  ACT was checked and  maintained at greater than 225 throughout the case.  Luge wires were  advanced through the stent in a prolapsed fashion so as not to get under his  stent strut.  One was positioned in the distal circumflex.  A second was  carefully drawn back and manipulated through the side of the stent into the  jailed obtuse marginal.  Kissing balloon angioplasty was performed using a  3.25 x 12 mm Maverick in the obtuse marginal and a 4.0 x 15 Maverick in the  AV groove circumflex.  With several inflations there was distal watermelon  seeding  of the SVG circumflex balloon.  However, kissing balloons were  finally achieved and inflated to eight atmospheres.  Final angiography  demonstrated less than 20% residual stenosis in the jailed obtuse marginal  with no stenosis in the AV groove circumflex.  The stent appeared  cylindrical without deformation.  Flow remained TIMI III in both vessel.  There was no  dissection.   COMPLICATIONS:  None.   FINDINGS:  1. Left Main:  The left main is angiographically normal.   1. LAD:  The LAD is a large vessel giving rise to a single diagonal branch.     It is calcified proximally.   1. Circumflex:  The circumflex is a large dominant vessel.  It gives rise to     a large first obtuse marginal and the PDA.  This marginal had a 75%     ostial stenosis reduced to less than 20% with kissing balloon     angioplasty.  The stent in the AV groove circumflex remains widely     patent.   1. RCA:  The RCA is a small nondominant vessel, which is angiographically     normal.   IMPRESSION AND PLAN:  Successful kissing balloon angioplasty of the ostium  of the first obtuse marginal reducing stenosis from 75% to less than 20%.   We will continue with Plavix.   Arteriotomy was closed using an Angio-Seal device.                                               Salvadore Farber, M.D.    WED/MEDQ  D:  10/17/2003  T:  10/19/2003  Job:   [id number]   cc:   Vale Haven. Andrey Campanile, M.D.  [address]  [place]  [place] [address]  Fax: [phone]   Charlton Haws, M.D.

## 2011-01-28 NOTE — Discharge Summary (Signed)
[name], [name]:  [id number]   MEDICAL RECORD NO.:  [id number]                   PATIENT TYPE:  INP   LOCATION:  6527                                 FACILITY:  [facility]   PHYSICIAN:  [name], M.D. [facility]         DATE OF BIRTH:  04/27/1941   DATE OF ADMISSION:  10/17/2003  DATE OF DISCHARGE:  10/18/2003                                 DISCHARGE SUMMARY   PROCEDURES:  1. Cardiac catheterization.  2. Coronary arteriogram.  3. Left ventriculogram.  4. PTCA of the circumflex and OM.   HOSPITAL COURSE:  [name] is a 70 year old male with known coronary  artery disease.  He had a stent to the A-V circumflex on October 08, 2003,  with otherwise normal arteries.  He was discharged on January 27 but came to  the ER on the day of admission for recurrent chest pain that was relived  with nitroglycerin.  He was evaluated by Dr. Gerri Spore who felt his symptoms  were concerning for recurrent anginal pain, and he was taken to the  catheterization lab.   Cardiac catheterization showed no in-stent restenosis at previously placed  stent and circumflex.  However, there was a plaque in the OM in the area of  the previously placed stent with a 75% stenosis.  This was treated with  angioplasty, reducing the stenosis to less than 20%.  He had been on Plavix  prior to admission, and this was continued.  Angioseal was attempted but  failed, and manual pressure was held.  There was no hematoma, ooze, or  bruit.   The next day, [name] had a slight elevation in his cardiac enzymes but  no chest pain or shortness of breath.  His other laboratory values were  within normal limits. Except for hemoglobin decreased at 10.9.  His  hemoglobin had dropped slightly after his previous catheterization to 12.0.  It was 12.0 on admission.  This is to be followed as an outpatient.   [name] had no chest pain or shortness of breath and was considered  stable for  discharge on October 18, 2003.  He has outpatient followup  arranged.   LABORATORY DATA:  Hemoglobin 10.9, hematocrit 32.1, WBC 5.3, platelets 240.  Sodium 136, potassium 3.7, chloride 104, CO2 28, BUN 10, creatinine 0.8,  glucose 106.  CK-MB 86/6.6.   CONDITION ON DISCHARGE:  Improved.   DISCHARGE DIAGNOSES:  1. Unstable anginal pain, status post percutaneous transluminal cardiac     angioplasty to the obtuse marginal this admission.  2. Status post cardiac catheterization October 08, 2003, with a 4 x 18 mm     Driver stent placed to his atrioventricular circumflex.  3. History of hypotension.  4. History of mildly elevated liver enzymes.  5. History of sinus bradycardia and atrioventricular block.  6. History of smokeless tobacco use.  7. History of knee arthroplasty.  8. Strong family history  of premature coronary artery disease.  9. Lipid status with total cholesterol 139, triglycerides 47, HDL 50, LDL 80     in January 2005.   DISCHARGE INSTRUCTIONS:  1. His activity level is to include no driving for two days and no strenuous     activity for a week.  2. He is to call the office for problems with the catheterization site.  3. He is to stick to a low-fat, low-salt diet.  4. He is to follow up with Dr. Eden Emms on February 10 as previously     scheduled, and he is follow up with Dr. Margrett Rud as well.   DISCHARGE MEDICATIONS:  1. Aspirin 325 mg daily.  2. Plavix 75 mg daily.  3. Tenormin 25 mg 1/2 tablet daily.  4. Zocor 40 mg daily.  5. Nitroglycerin p.r.n.      [name], P.A. [facility]                  [name], M.D. [facility]    RB/MEDQ  D:  10/18/2003  T:  10/19/2003  Job:  [id number]   cc:   Vale Haven. Andrey Campanile, M.D.  [address]  [place]  [place] [address]  Fax: [phone]   Charlton Haws, M.D.

## 2011-01-28 NOTE — Cardiovascular Report (Signed)
NAME:  [name], [name]:  [id number]   MEDICAL RECORD NO.:  [id number]          PATIENT TYPE:  OIB   LOCATION:  6598                         FACILITY:  [facility]   PHYSICIAN:  Charlton Haws, M.D.     DATE OF BIRTH:  February 16, 1941   DATE OF PROCEDURE:  DATE OF DISCHARGE:                              CARDIAC CATHETERIZATION   INDICATION:  Recurrent chest pain status post stenting of the AV circumflex  and angioplasty of the ostium of the first obtuse marginal branch, and most  recent intervention done by Dr. _________ on February 2005.   Said catheterization was done with 4 French catheter in the right femoral  artery.   Left main coronary artery had a 20% tubular stenosis distally.   Left anterior descending artery had 20% _________ lesions and proximal and  midportion distal vessel was normal.  Circumflex coronary artery had a  widely patent stent in the mid-AV circumflex portion.  The ostium of the  first obtuse marginal branch had a 40% tubular stenosis.  The patient had a  left dominant circumflex with normal PDA and PLA.   The right coronary artery was small and nondominant.  There was a 48 to 50%  lesion distally.   _________ ventriculopathy was somewhat suboptimal with poor filling of the  apex; however, there appeared to be normal LV function with an EF of 55%.  There is no gradient across the aortic valve and no MR.  Aortic pressure was  128/70.  The LV pressure was 128/14.   IMPRESSION:  The patient does not have any critical restenosis.  His stent  is widely patent.  I do not think that the ostium of the obtuse marginal  branch is flow limiting.  He will be discharged later today.  He will follow  up with myself and Dr. Karma Ganja.  Follow-up CT scanning of the chest may  be in order if he has recurrent chest pain.  I will leave this up to Dr.  Karma Ganja.      PN/MEDQ  D:  10/22/2004  T:  10/22/2004  Job:  [id number]   cc:   Vale Haven. Andrey Campanile,  M.D.  [address]  [place]  [place] [address]  Fax: [phone]

## 2011-01-28 NOTE — H&P (Signed)
NAME:  HAYES, CZAJA NO.:  [id number]   MEDICAL RECORD NO.:  [id number]                   PATIENT TYPE:  EMS   LOCATION:  MAJO                                 FACILITY:  [facility]   PHYSICIAN:  Carole Binning, M.D. [facility]         DATE OF BIRTH:  Sep 16, 1940   DATE OF ADMISSION:  10/17/2003  DATE OF DISCHARGE:                                HISTORY & PHYSICAL   REASON FOR ADMISSION:  Mr. Molinelli is a 70 year old male, status post recent  coronary angiogram for evaluation of chest pain revealing significant single-  vessel coronary artery disease with an 80% circumflex lesion, successfully  treated with placement of a non-drug eluting stent, by Dr. Veneda Melter, on  January 26th.  Left ventricular function was normal.  He was discharged the  following morning in stable condition.   The patient now returns to the emergency room with complaint of intermittent  chest discomfort, not related to any specific activity or exertion, and  dissimilar from his recent presenting symptoms.  However, he has taken  nitroglycerin on several occasions with noted relief.  Unlike his recent  presentation, these episodes are not associated with any radiation to the  left upper extremity, diaphoresis or nausea.  He does note some mild  associated dyspnea.   The patient denies any precipitation of any chest discomfort with either  walking or level ground or up hill, stairs or with any strenuous activity.  Electrocardiogram on admission is benign, revealing first degree AV block,  but no ischemic changes.  Initial cardiac markers are normal.   ALLERGIES:  No known drug allergies.   CURRENT MEDICATIONS:  1. Coated aspirin 325 daily.  2. Plavix 75 daily.  3. Zocor 40 q.h.s.  4. Atenolol 25 daily.  5. Nitrostat.   PAST MEDICAL HISTORY/ SOCIAL HISTORY/ AND FAMILY HISTORY:  Please refer to  recent admission history and physical January, 2005.   REVIEW OF SYSTEMS:  Denies any  orthopnea, paroxysmal nocturnal dyspnea or  pedal edema.  No evidence of upper or lower GI bleeding.  The remaining  systems negative.   PHYSICAL EXAMINATION:  VITAL SIGNS:  Blood pressure 101/67, pulse 57 and  regular, respirations 16.  Temperature 99.4.  SIO2 94% (room air).  GENERAL:  A 70 year old male, no acute distress.  HEENT:  Normocephalic, atraumatic.  NECK:  __________carotid pulse without bruits.  LUNGS:  Clear to auscultation in all [place].  HEART:  Regular rate and rhythm, S1 and S2.  No murmurs, rubs or gallops.  ABDOMEN:  Soft, nontender.  EXTREMITIES:  Preserved bilateral femoral pulse with a small right groin  hematoma/ ecchymosis; no bruits on auscultation.  Preserved femoral and  distal pulses.  NEUROLOGIC:  No focal deficit.   LABORATORY DATA:  MB less than 1.0, troponin I less than 0.05, elevated  myoglobin at 239 (recent myoglobin elevated at 247).   IMPRESSION:  1. Recurrent, atypical chest pain.  2. Single vessel coronary artery disease.     A. Status post stent, non DES, circumflex October 08, 2003.     B. Normal left ventricular function.  3. Tobacco.  4. Sinus bradycardia/ first-degree AV block.  5. Hypotension.  6. Status post recent mildly-elevated liver enzymes.  7. Low-grade fever.   PLAN:  1. The patient will be admitted for rule out myocardial infarction.  He will     continue a current medication regimen, safe for down titration of     atenolol to 12.5 mg every day given the noted sinus bradycardia and     hypertension.  He has noted some occasional lightheadedness, and it may     be related to his low blood pressure.  2. If cardiac markers are abnormal, then we will add intravenous heparin.  3. We will start him on a proton pump inhibitor.  4. The patient has been given the option of proceeding directly with a     diagnostic coronary angiogram for definite exclusion of any significant     stenoses.  He is currently considering this, and if  agrees, we will plan     on performing the procedure later this afternoon.      Gene Serpe, P.A. [facility]                      Carole Binning, M.D. [facility]    GS/MEDQ  D:  10/17/2003  T:  10/17/2003  Job:  [id number]   cc:   Vale Haven. Andrey Campanile, M.D.  [address]  [place]  [place] [address]  Fax: [phone]

## 2011-01-28 NOTE — Assessment & Plan Note (Signed)
[facility]                            CARDIOLOGY OFFICE NOTE   NAME:FIELDSTelvin, [name]                       MRN:          [id number]  DATE:10/18/2006                            DOB:          October 27, 1940    [name] returns today in followup.  He is status post complex intervention  to the LM circumflex in 2004.  He had restenosis and had re-intervention  in 2005.  He had chest pain in 2006 and had a cath with a widely patent  circumflex.  He did have some chest pain about a month or 2 ago.  He  required nitro.  He has not had any since then.  He is otherwise doing  well.   REVIEW OF SYSTEMS:  Remarkable for continued rehab on his right arm and  wrist.  He had surgery about 3 months ago, and has been slow to recover.  He has had a URI and a cough, but no fever.   He has some problems with his molars, and his dentist would like to  extract them.  I told him to make sure he stayed on his Plavix for this.   MEDICATIONS:  1. Atenolol 25 a day.  2. Plavix 75 a day.  3. Aspirin a day.  4. Claritin.  5. Lasix 20 a day.   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  He is wearing  compression hose.  I talked to him about using the Lasix p.r.n., and he  is to see a vein specialist after his arm is rehabbed to consider  sclerotherapy.   EXAMINATION:  Blood pressure is 130/70.  Pulse 70 and regular.  HEENT:  Normal carotids without bruits.  There is no thyromegaly.  LUNGS:  Clear.  S1 and S2 with normal heart sounds.  ABDOMEN:  Benign.  Distal pulses are intact.  He has bilateral varicosities, left greater  than right.  He has a soft splint on his right wrist.   IMPRESSION:  Coronary disease, complex intervention to the circumflex.  Patent by catheterization in 2006.  Isolated episode of chest pain.  Continue medical therapy.  He will continue his aspirin and Plavix.   The patient will have a followup Myoview in 6 months.   He knows to call us  if he has any recurrent episodes.  We will continue  his beta blocker as well.  He knows to make sure the dentist does not  stop his Plavix under any circumstances.   In regards to his lower extremity edema, he will take his Lasix p.r.n.  He has documented varicosities, and will see the vein specialist after  his arm is rehabbed.  He is a good candidate for sclerotherapy.   When we see him back for his Myoview in 6 months, it may be worthwhile  to check a lipid and liver profile.   I will have to go back through the chart and see why he is not on a  statin drug.   Overall, I think he is doing fairly well, and his biggest challenge  seems to be  rehabbing his right arm.     [name], MD, [facility]  Electronically Signed    PCN/MedQ  DD: 10/18/2006  DT: 10/18/2006  Job #: [id number]

## 2011-01-28 NOTE — Op Note (Signed)
[name], [name]                ACCOUNT NO.:  [id number]   MEDICAL RECORD NO.:  [id number]          PATIENT TYPE:  AMB   LOCATION:  SDS                          FACILITY:  [facility]   PHYSICIAN:  Dionne Ano. Gramig III, M.D.DATE OF BIRTH:  04/16/1941   DATE OF PROCEDURE:  DATE OF DISCHARGE:                                 OPERATIVE REPORT   PREOPERATIVE DIAGNOSIS:  Comminuted complex interarticular distal radius  fracture with carpal tunnel syndrome signs and symptoms (greater than 5-7  fragments about the distal radius fracture.)   POSTOPERATIVE DIAGNOSIS:  Comminuted complex interarticular distal radius  fracture with carpal tunnel syndrome signs and symptoms (greater than 5-7  fragments about the distal radius fracture.)   PROCEDURES PERFORMED:  1. Open reduction, internal fixation of comminuted complex interarticular      fracture of distal radius, right upper extremity.  2. Right open carpal tunnel release.  3. Stress radiography.   SURGEON:  Dionne Ano. [name], M.D.   ASSISTANT:  Karie Chimera.   COMPLICATIONS:  None.   ANESTHESIA:  General.   DRAINS:  One.   TOURNIQUET TIME:  Approximately an hour.   INDICATIONS FOR PROCEDURE:  This patient is a pleasant 70 year old male who  presents with the above-mentioned diagnosis.  I have counseled him with  regards to risks and benefits of surgery, including the risk of infection,  bleeding, anesthesia, damage to normal structures, and failure of surgery  __________  and restoring function.  With this in mind, he has asked to  proceed.  All questions have been encouraged and answered preoperatively.   DESCRIPTION OF PROCEDURE:  The patient was seen by myself in Anesthesia and  taken to the operating suite and underwent smooth induction of general  anesthesia.  Preoperative Ancef was given.  He was laid supine,  appropriately padded, prepped and draped in the usual sterile fashion about  the right upper extremity.  Once this  was done, fingertrap traction was  placed and the patient then underwent a volar radial incision.  Dissection  was carried down.  The FCR tendon sheath was incised, both palmarly and  dorsally.  The carpal canal contents were swept ulnarly and the pronator  quadratus was then identified and incised in an L-shape fashion.  This  allowed reflection and exposure of the fracture site.  I then very carefully  and meticulously mobilized the fracture site.  The patient had a large  fracture void and a high degree of comminution with greater than 5-7  fragments.  I thus, at this time, placed a large amount of G[place]on bone  graft with cancellous chips and demineralized bone matrix.  This filled the  defect well.  Following this, the fracture was reduced to my satisfaction  and an extended plate from the [facility] set was placed.  The patient had the  shaft fragment engaged first.  Alignment was verified.  Reduction was then  held and following this, a combination of smooth and threaded pegs were  placed distally.  Following this, the plate was additionally filled with  screws as necessary proximally.  This  was done under standard technique  without difficulty.  Once this was done, he was checked under AP, lateral,  and oblique x-rays and live fluoro was performed to assess stability, which  looked excellent.  I was pleased with the findings.  Radial height,  inclination, and volar tilt were restored to my satisfaction.  He had a  smooth arc of motion and pins/pegs and screws were in excellent position.  Following this, I then repaired the pronator quadratus to my satisfaction  without difficulty, utilizing a 3-0 Vicryl suture.  Once this was done,  attention was turned towards the carpal canal.  The patient underwent  incision beginning at the distal wrist crease and coursing distally.  Dissection was carried down, palmar fascia incised.  Superficial palmar arch  was identified and retracted out of harm's  way.  Following this, I then  performed release of the tranverse carpal ligament.  He had a very thickened  ligament with some blood in the canal.  The median nerve was intact.  I then  dissected distally until the fat pad regressed and he was completed released  without difficulty.  Following this, proximal dissection ensued until I was  able to slide blunt-tip scissors to release the remaining proximal  __________  and the antebrachial fascia.  I should note that the median  nerve was kept intact.  It was somewhat hyperemic and flattened.  The  patient had the superficial palmar arch protected and complete release was  verified visually with 4.0 loupe magnification.  Following this, I deflated  the tourniquet, obtained hemostasis with bipolar electrocautery as I did  during the initial dissection about both incisions.  Following this, copious  irrigation was applied and the carpal tunnel release site was closed with 4-  0 Prolene.  The volar radial incision was closed with 3-0 Vicryl followed by  3-0 Prolene.  Neosporin was placed against the skin after it was cleansed,  and Xeroform was placed.  I did place a small TLS size 7 drain in the volar  radial wound to allow for the egress of fluid.  This will be discontinued in  24 hours or less.   Thus, the patient underwent carpal tunnel release and ORIF of a comminuted  complex distal radius fracture with an extended DVR plate, as the fracture  dictated.  Stress radiography revealed excellent stability and I was quite  pleased with the surgery, its stability, and the reconstruction efforts.   The patient will be admitted for IV antibiotics, pain management, and  general observation, including elevation, OT consult, etc.   At the time of first postop visit, we are going to remove his sutures, take  AP and lateral x-rays, and place him in a short arm cast.  We will initiate digital range of motion, etc.  I will monitor his condition closely  and ask  Cardiology to see him for any cardiac needs during this hospitalization, of  course.  We will start his Plavix tomorrow afternoon.           ______________________________  Dionne Ano. Everlene Other, M.D.     Nash Mantis  D:  07/11/2006  T:  07/12/2006  Job:  [id number]   cc:   Dionne Ano. Everlene Other, M.D.

## 2011-01-28 NOTE — Discharge Summary (Signed)
NAMEKAYLE, CORREA NO.:  [id number]   MEDICAL RECORD NO.:  [id number]                   PATIENT TYPE:  INP   LOCATION:  6525                                 FACILITY:  [facility]   PHYSICIAN:  Charlton Haws, M.D.                  DATE OF BIRTH:  Mar 06, 1941   DATE OF ADMISSION:  10/07/2003  DATE OF DISCHARGE:  10/09/2003                                 DISCHARGE SUMMARY   DISCHARGE DIAGNOSES:  1. Admit with exertional angina.  2. High grade lesion at the left circumflex after first obtuse marginal with     ejection fraction 60%, no wall motion abnormalities, status post     successful stent with Denver 4.0 x 18 stent with restoration of TIMI-3     flow.  Home on Plavix for at least one month.   SECONDARY DIAGNOSES:  1. Unknown lipid status.  2. Elevated blood pressure on admission.  3. Status post right knee arthroplasty.  4. Strong family history of coronary artery disease in both father and     brother.   DISPOSITION:  Mr. Lambert Jeanty is ready for discharge on October 09, 2003,  after undergoing first left heart catheterization on October 07, 2003, then  a PCI intervention on October 08, 2003, with stenting of lesion in the mid  circumflex.  He has had no further chest pain while at the hospital.  He is  tolerating his discharge medications well.   LABORATORY DATA:  His post stenting CK is 91 and CK-MB is 5.8.  Post  catheterization his complete blood count showed white cells 5.9, hemoglobin  12, hematocrit 34.1.  Serum electrolytes on October 09, 2003, sodium 134,  potassium 3.5, chloride 100, bicarbonate 26, BUN 4, creatinine 0.8, glucose  106.  Lipid study this hospitalization, cholesterol 139, triglycerides 47,  HDL cholesterol 50, LDL cholesterol 80.  His TSH was 2.537.   PROCEDURES:  1. October 07, 2003, left heart catheterization in setting  of unstable     angina.  The right coronary artery is nondominant.  The LAD is free of  disease as are his diagonal branches.  The left circumflex has a 95%     stenosis after the first obtuse marginal.  The ejection fraction is 65%,     no wall motion abnormalities, no mitral regurgitation.  2. October 08, 2003, PCI of the stenosis in the left circumflex with a 4.0 x     18 Denver stent restoring TIMI-3 flow.  No vessel damage.  The patient     will be placed on Plavix for at least one month.   DISCHARGE MEDICATIONS:  1. Enteric coated aspirin 325 mg daily.  2. Plavix 75 mg daily for at least one month, possibly longer.  3. Zocor 40 mg daily to be taken at bedtime.  4. Atenolol 25 mg daily.   HISTORY OF PRESENT ILLNESS:  [name] is a 70 year old male with a  recent history of chest pain with strongly exertional component, first  appearing four days ago while out rabbit hunting.  He describes it as sharp,  sternal.  Chest pain lasted about one minute, radiates to the left shoulder.  It is accompanied by dyspnea.  he had chest pain again the next day three  days ago while carrying wood.  The first episodes of chest pain are usually  worse and the last ones are lesser in magnitude.  He does not have  accompanying nausea, vomiting, or diaphoresis.  It is not positional and  does not increase when he takes a deep breath.  The patient will be given  sublingual nitroglycerin and aspirin, started on Lopressor 25.5 mg b.i.d.,  started on IV heparin and taken for immediate left heart catheterization the  same day of admission October 07, 2003.   HOSPITAL COURSE:  Mr. Curvin Hunger entered . [facility]  with exertional angina through the emergency room.  He was seen there by Dr.  Charlton Haws, cardiologist, who after apprising himself of the patient's  recent history, I recommended left heart catheterization which was done the  same day.  A focal lesion in the left circumflex was found as described  above.  The following day, he underwent PCI of this  lesion with placement of  a Denver stent.  His left heart function is unaffected with ejection  fraction of 60%.  He has had no further chest pain this hospitalization.  The catheterization sites are healing nicely without erythema, without  hematoma, and without drainage.  The patient goes home with the medication  as dictated.  He follows up in the [facility] in two weeks with Dr.  Eden Emms.      Maple Mirza, P.A.                    Charlton Haws, M.D.    GM/MEDQ  D:  10/09/2003  T:  10/10/2003  Job:  [id number]   cc:   Vale Haven. Andrey Campanile, M.D.  [address]  [place]  [place] [address]  Fax: [phone]

## 2011-01-28 NOTE — Assessment & Plan Note (Signed)
[facility]                              CARDIOLOGY OFFICE NOTE   NAME:FIELDSAndy, [name]                       MRN:          [id number]  DATE:07/28/2006                            DOB:          [birth year]    [name] returns today for followup.  He has coronary artery disease with  previous stenting of the AV circumflex, ostium of the first obtuse marginal  branch.   The patient unfortunately fell and broke his right arm and wrist.  He was  operated on by Dr. Amanda [name] at the end of October.  Unfortunately, he has a  significant plate.  The fracture appeared to involve his wrist and he has a  cast on his right arm for another eight weeks.  He is back on his aspirin  and Plavix, given his stents.  He does not have any significant chest pain.  He had a non-ischemic Myoview earlier this year.  Overall, his cardiac  status appears stable.  He has improving lower extremity edema since he has  lost some weight, but does have a followup appointment for venous  insufficiency with Dr. Danie [name].   REVIEW OF SYSTEMS:  Remarkable for poor sleep.  He does have documented  sleep apnea, apparently, by local testing, here in [place].  He has  difficulty wearing the full face mask, but was prescribed this due to a  previous broken nose and a lot of mouth breathing.  I will try to get him in  to see Dr. Shelle [name] for followup of this.   PHYSICAL EXAMINATION:  VITAL SIGNS:  Blood pressure is 130/80, pulse 70 and  regular.  HEENT:  Normal.  NECK:  There is no lymphadenopathy, no thyromegaly.  There are no carotid  bruits.  LUNGS:  Clear.  HEART:  There is an S1, S2, with normal heart sounds.  EXTREMITIES:  His right arm is in a cast.  ABDOMEN:  Benign.  There is no AAA, no renal bruits.  EXTREMITIES:  Distal pulses are intact with no edema.  NEUROLOGIC:  Nonfocal.   MEDICATIONS:  For his blood pressure, include atenolol 25 mg a day.  He is  on aspirin and Plavix for his  stents and coronary disease.  He takes 20 of  Lasix a day for his lower extremity edema.   IMPRESSION:  Stable coronary disease, no perioperative complications in  regards to his coronary disease after breaking his right forearm.  He will  continue rehab for this.  We will continue his current medications.  He is  not having any significant chest pain.  He is to see [name] for his  venous insufficiency.   Overall, I think the patient's cardiac status is stable.    [name] Pick. Eden Emms, MD, [facility]  Electronically Signed   PCN/MedQ  DD: 07/28/2006  DT: 07/28/2006  Job #: [id number]

## 2011-02-04 ENCOUNTER — Telehealth: Payer: Self-pay | Admitting: Cardiovascular Disease

## 2011-02-04 ENCOUNTER — Other Ambulatory Visit: Payer: Self-pay | Admitting: Cardiology

## 2011-02-04 MED ORDER — ATENOLOL 50 MG PO TABS: 50.0000 mg | ORAL_TABLET | Freq: Every day | ORAL | Status: DC

## 2011-02-04 NOTE — Telephone Encounter (Signed)
Pharmacy called and informed wrong provider.

## 2011-02-04 NOTE — Telephone Encounter (Signed)
Pt needs atenolol to be call in to [facility] # [phone]. Pt is going out of town pt needs med to be call in today.

## 2011-02-14 ENCOUNTER — Telehealth: Payer: Self-pay | Admitting: Pulmonary Disease

## 2011-02-14 NOTE — Telephone Encounter (Signed)
[facility], have you seen this request? Please advise thanks

## 2011-02-15 NOTE — Telephone Encounter (Signed)
Have not seen it.  They are supposed to provide pt with supplies on an ongoing basis.  No other dme asks me to fill out paperwork for a simple cushion.  What's the deal?

## 2011-02-15 NOTE — Telephone Encounter (Signed)
[facility] at [phone] to see why they are requesting this.  Office closed.  WCB tomorrow.

## 2011-02-16 NOTE — Telephone Encounter (Signed)
Done, and put in basket to be faxed.

## 2011-02-16 NOTE — Telephone Encounter (Signed)
Sharyn Creamer, spoke with Portia.  States [place] faxed a rx for cpap supplies on pt bc the one they have on file is outdated.  Advised [facility] did not receive this -- Daniel Nones will have Haley refax to [facility].  Will hold message in triage until fax received then forward to [facility].

## 2011-02-16 NOTE — Telephone Encounter (Signed)
Fax received and placed in [facility] folder.  Will forward message to him.

## 2011-02-17 NOTE — Telephone Encounter (Signed)
[facility] has placed rx in basket to be faxed.

## 2011-04-21 ENCOUNTER — Telehealth: Payer: Self-pay | Admitting: Cardiovascular Disease

## 2011-04-21 NOTE — Telephone Encounter (Signed)
Pt was seeing dr Aldean Ast at [facility] urology , he has retired and pt wants to know who dr Eden Emms would recommend him to see there

## 2011-04-21 NOTE — Telephone Encounter (Signed)
Will forward for dr nishan review [name]  

## 2011-04-22 NOTE — Telephone Encounter (Signed)
Dr Isabel Caprice, Dr Hillis Range are good urologist

## 2011-04-26 NOTE — Telephone Encounter (Signed)
Left message for pt of dr Fabio Bering recommendations [name]

## 2011-04-27 ENCOUNTER — Encounter: Payer: Self-pay | Admitting: Cardiovascular Disease

## 2011-04-28 ENCOUNTER — Telehealth: Payer: Self-pay | Admitting: Internal Medicine

## 2011-04-28 NOTE — Telephone Encounter (Signed)
I reviewed this with Dr. Graciela Husbands. Since the device is on the left and he is right handed, it is ok for him to use a shotgun. The patient is aware.

## 2011-04-28 NOTE — Telephone Encounter (Signed)
Per pt call. Pt has pacemaker and pt likes to hunt and shot shotguns, and the season is coming in. Pt wants to know if he is permitted to shoot a shotgun or if there is too high of a risk of pt getting a concussions or negative repercussions from the shotgun "kick"  Pt is right handed and pacer is on the left hand side.   Please return pt call to advise/discuss.

## 2011-05-20 ENCOUNTER — Encounter: Payer: Self-pay | Admitting: Cardiovascular Disease

## 2011-05-20 ENCOUNTER — Ambulatory Visit (INDEPENDENT_AMBULATORY_CARE_PROVIDER_SITE_OTHER): Payer: Medicare Other | Admitting: Cardiovascular Disease

## 2011-05-20 VITALS — BP 150/84 | HR 87 | Resp 14 | Ht 72.0 in | Wt 228.0 lb

## 2011-05-20 DIAGNOSIS — C9000 Multiple myeloma not having achieved remission: Secondary | ICD-10-CM

## 2011-05-20 DIAGNOSIS — I10 Essential (primary) hypertension: Secondary | ICD-10-CM

## 2011-05-20 DIAGNOSIS — R609 Edema, unspecified: Secondary | ICD-10-CM

## 2011-05-20 DIAGNOSIS — Z8679 Personal history of other diseases of the circulatory system: Secondary | ICD-10-CM

## 2011-05-20 DIAGNOSIS — Z950 Presence of cardiac pacemaker: Secondary | ICD-10-CM

## 2011-05-20 MED ORDER — LOSARTAN POTASSIUM-HCTZ 50-12.5 MG PO TABS: 1.0000 | ORAL_TABLET | Freq: Every day | ORAL | Status: DC

## 2011-05-20 NOTE — Assessment & Plan Note (Signed)
Stent to circumflex patent 10/11

## 2011-05-20 NOTE — Assessment & Plan Note (Signed)
Add Hyzaar and revaluate.  Low sodium diet

## 2011-05-20 NOTE — Assessment & Plan Note (Signed)
Add HCTZ  Dependant no evidence of DVT

## 2011-05-20 NOTE — Assessment & Plan Note (Signed)
F/U [place]  SPEP and UPEP for protein levels

## 2011-05-20 NOTE — Patient Instructions (Signed)
Your physician wants you to follow-up in: 6 MONTHS  You will receive a reminder letter in the mail two months in advance. If you don't receive a letter, please call our office to schedule the follow-up appointment.   START LOSARTAN/HCTZ 50/12.5 MG ONCE DAILY

## 2011-05-20 NOTE — Progress Notes (Signed)
[name] is seen today for F/U of HTN, elevated lipids, CHB with pacer and CAD Has myeloma with elevated LFT;s and is not on statin  BP is up with some LE edema.  Lots of questions today.  Ok to hunt doves Right handed with pacer on left.  Needs health care power of attorney.  Not sleeping well needs CPAP adjusted.  Has F/U with Dr Shelle Iron.  Sees [facility] who will recheck labs after starting ARB/diuretic.     Heart block - status post [employer] medical pacemaker in October  2011.    Coronary artery disease - status post circumflex stent in the past.  Cardiac catheterization in October 2011 demonstrated left anterior  descending 30% to 40% stenosis with other nonobstructive disease  with medical therapy recommended.  Compliant with meds  ROS: Denies fever, malais, weight loss, blurry vision, decreased visual acuity, cough, sputum, SOB, hemoptysis, pleuritic pain, palpitaitons, heartburn, abdominal pain, melena, l claudication, or rash.  All other systems reviewed and negative  General: Affect appropriate Healthy:  appears stated age HEENT: normal Neck supple with no adenopathy JVP normal no bruits no thyromegaly Lungs clear with no wheezing and good diaphragmatic motion Heart:  S1/S2 no murmur,rub, gallop or click PMI normal Abdomen: benighn, BS positve, no tenderness, no AAA no bruit.  No HSM or HJR Distal pulses intact with no bruits Plus one bilateral  edema Neuro non-focal Skin warm and dry No muscular weakness Pacer under left clavicle   Current Outpatient Prescriptions  Medication Sig Dispense Refill  . aspirin 81 MG tablet Take 81 mg by mouth daily.        Marland Kitchen atenolol (TENORMIN) 50 MG tablet Take 1 tablet (50 mg total) by mouth daily.  30 tablet  6  . glucosamine-chondroitin 500-400 MG tablet Take 1 tablet by mouth daily.        . Multiple Vitamin (MULTIVITAMIN) capsule Take 1 capsule by mouth daily.        . nitroGLYCERIN (NITROSTAT) 0.4 MG SL tablet Place 0.4 mg under the tongue  every 5 (five) minutes as needed.        . Omega-3 Fatty Acids (FISH OIL) 1200 MG CAPS Take 1 capsule by mouth daily.          Allergies  Review of patient's allergies indicates no known allergies.  Electrocardiogram:  AV pacing rate 75 PR LBBB morphology  Assessment and Plan

## 2011-05-20 NOTE — Assessment & Plan Note (Signed)
Normal function on ECG today  F/U Graciela Husbands October

## 2011-05-26 ENCOUNTER — Other Ambulatory Visit (HOSPITAL_COMMUNITY): Payer: Self-pay | Admitting: Oncology

## 2011-05-26 ENCOUNTER — Encounter (HOSPITAL_BASED_OUTPATIENT_CLINIC_OR_DEPARTMENT_OTHER): Payer: Medicare Other | Admitting: Oncology

## 2011-05-26 DIAGNOSIS — I2510 Atherosclerotic heart disease of native coronary artery without angina pectoris: Secondary | ICD-10-CM

## 2011-05-26 DIAGNOSIS — G4730 Sleep apnea, unspecified: Secondary | ICD-10-CM

## 2011-05-26 DIAGNOSIS — N400 Enlarged prostate without lower urinary tract symptoms: Secondary | ICD-10-CM

## 2011-05-26 DIAGNOSIS — D472 Monoclonal gammopathy: Secondary | ICD-10-CM

## 2011-05-26 LAB — CBC WITH DIFFERENTIAL/PLATELET
BASO%: 0.7 % (ref 0.0–2.0)
Eosinophils Absolute: 0.5 10*3/uL (ref 0.0–0.5)
LYMPH%: 20.7 % (ref 14.0–49.0)
MCHC: 35.9 g/dL (ref 32.0–36.0)
MONO#: 0.8 10*3/uL (ref 0.1–0.9)
NEUT#: 3 10*3/uL (ref 1.5–6.5)
RBC: 4.19 10*6/uL — ABNORMAL LOW (ref 4.20–5.82)
RDW: 12.3 % (ref 11.0–14.6)
WBC: 5.4 10*3/uL (ref 4.0–10.3)
lymph#: 1.1 10*3/uL (ref 0.9–3.3)
nRBC: 0 % (ref 0–0)

## 2011-05-27 LAB — LACTATE DEHYDROGENASE: LDH: 173 U/L (ref 94–250)

## 2011-05-27 LAB — COMPREHENSIVE METABOLIC PANEL
ALT: 56 U/L — ABNORMAL HIGH (ref 0–53)
AST: 59 U/L — ABNORMAL HIGH (ref 0–37)
Albumin: 3.9 g/dL (ref 3.5–5.2)
Alkaline Phosphatase: 64 U/L (ref 39–117)
Calcium: 9 mg/dL (ref 8.4–10.5)
Chloride: 94 mEq/L — ABNORMAL LOW (ref 96–112)
Potassium: 4.2 mEq/L (ref 3.5–5.3)
Sodium: 131 mEq/L — ABNORMAL LOW (ref 135–145)
Total Protein: 8.4 g/dL — ABNORMAL HIGH (ref 6.0–8.3)

## 2011-05-27 LAB — IGG, IGA, IGM: IgM, Serum: 4150 mg/dL — ABNORMAL HIGH (ref 41–251)

## 2011-06-10 ENCOUNTER — Ambulatory Visit (INDEPENDENT_AMBULATORY_CARE_PROVIDER_SITE_OTHER): Payer: Medicare Other | Admitting: Pulmonary Disease

## 2011-06-10 ENCOUNTER — Encounter: Payer: Self-pay | Admitting: Pulmonary Disease

## 2011-06-10 VITALS — BP 122/78 | HR 98 | Temp 98.3°F | Ht 72.0 in | Wt 229.8 lb

## 2011-06-10 DIAGNOSIS — G4733 Obstructive sleep apnea (adult) (pediatric): Secondary | ICD-10-CM

## 2011-06-10 NOTE — Patient Instructions (Signed)
Will get your dme to look at your machine and get you a new cushion. Work on weight loss followup with me in one year.

## 2011-06-10 NOTE — Progress Notes (Signed)
  Subjective:    Patient ID: [name], male    DOB: [birth year], 70 y.o.   MRN: [id number]  HPI Patient comes in today for followup of his obstructive sleep apnea.  He is wearing his CPAP compliantly, and denies any issues with his mask fit or pressure.  He is having an issue with his machine, with his on/off button sticking.  He is also due for a new cushion for his mask.  He feels that he is sleeping adequately, and denies significant daytime sleepiness.   Review of Systems  Constitutional: Negative for fever and unexpected weight change.  HENT: Positive for congestion, rhinorrhea and dental problem. Negative for ear pain, nosebleeds, sore throat, sneezing, trouble swallowing, postnasal drip and sinus pressure.   Eyes: Negative for redness and itching.  Respiratory: Negative for cough, chest tightness, shortness of breath and wheezing.   Cardiovascular: Positive for leg swelling. Negative for palpitations.  Gastrointestinal: Negative for nausea and vomiting.  Genitourinary: Negative for dysuria.  Musculoskeletal: Negative for joint swelling.  Skin: Positive for rash.  Neurological: Negative for headaches.  Hematological: Does not bruise/bleed easily.  Psychiatric/Behavioral: Negative for dysphoric mood. The patient is not nervous/anxious.        Objective:   Physical Exam Overweight male in no acute distress No skin breakdown or pressure necrosis from the CPAP mask Lower extremities with no significant edema, no cyanosis Alert, does not appear to be sleepy, moves all 4 extremities.       Assessment & Plan:

## 2011-06-10 NOTE — Assessment & Plan Note (Signed)
The patient is doing well from a sleep apnea standpoint, and is wearing CPAP compliantly.  We will arrange to have his CPAP machine checked, and we'll also get him a new cushion for his mask.  I've encouraged him to work aggressively on weight loss, and to keep up with his supplies.  He is to followup with me in one year.

## 2011-06-15 ENCOUNTER — Telehealth: Payer: Self-pay | Admitting: *Deleted

## 2011-06-15 ENCOUNTER — Other Ambulatory Visit: Payer: Self-pay | Admitting: Pulmonary Disease

## 2011-06-15 DIAGNOSIS — G4733 Obstructive sleep apnea (adult) (pediatric): Secondary | ICD-10-CM

## 2011-06-15 NOTE — Telephone Encounter (Signed)
The patient brought in his CPAP today and we checked it out.  It is very hard to turn on and off and the patient states it is about 71 years old.  The patient is interested in getting a new CPAP after we discussed his insurance benefits with him.  Please send an order w/a setting.  CMN attached and given to Dr Shelle Iron. Pt last ov 06/10/11 .Kandice Hams CMA

## 2011-06-16 NOTE — Telephone Encounter (Signed)
Order given to [facility] to go to pt's home and reck his 02 per [facility] nurse

## 2011-06-17 LAB — CBC
HCT: 41.4
MCV: 88.9
Platelets: 200
RBC: 4.66
WBC: 5.5

## 2011-06-17 LAB — COMPREHENSIVE METABOLIC PANEL
AST: 21
Albumin: 3.4 — ABNORMAL LOW
Alkaline Phosphatase: 56
CO2: 27
Chloride: 100
Creatinine, Ser: 0.63
GFR calc Af Amer: >60
GFR calc non Af Amer: >60
Potassium: 4.2
Total Bilirubin: 1

## 2011-06-17 LAB — DIFFERENTIAL
Basophils Absolute: 0
Basophils Relative: 0
Eosinophils Absolute: 0.3
Eosinophils Relative: 6 — ABNORMAL HIGH
Lymphocytes Relative: 23
Monocytes Absolute: 0.5

## 2011-06-17 LAB — PROTIME-INR: Prothrombin Time: 14.8

## 2011-07-12 ENCOUNTER — Ambulatory Visit (INDEPENDENT_AMBULATORY_CARE_PROVIDER_SITE_OTHER): Payer: Medicare Other | Admitting: Internal Medicine

## 2011-07-12 ENCOUNTER — Encounter: Payer: Self-pay | Admitting: Internal Medicine

## 2011-07-12 VITALS — BP 152/89 | HR 88 | Resp 18 | Ht 72.0 in | Wt 227.0 lb

## 2011-07-12 DIAGNOSIS — I10 Essential (primary) hypertension: Secondary | ICD-10-CM

## 2011-07-12 DIAGNOSIS — I495 Sick sinus syndrome: Secondary | ICD-10-CM

## 2011-07-12 DIAGNOSIS — I4892 Unspecified atrial flutter: Secondary | ICD-10-CM

## 2011-07-12 DIAGNOSIS — R609 Edema, unspecified: Secondary | ICD-10-CM

## 2011-07-12 DIAGNOSIS — I442 Atrioventricular block, complete: Secondary | ICD-10-CM

## 2011-07-12 DIAGNOSIS — Z950 Presence of cardiac pacemaker: Secondary | ICD-10-CM

## 2011-07-12 LAB — PACEMAKER DEVICE OBSERVATION
AL IMPEDENCE PM: 462.5 Ohm
AL THRESHOLD: 0.75 V
ATRIAL PACING PM: 54
BAMS-0003: 70 {beats}/min
BATTERY VOLTAGE: 2.9629 V
RV LEAD IMPEDENCE PM: 487.5 Ohm
VENTRICULAR PACING PM: 100

## 2011-07-12 NOTE — Assessment & Plan Note (Signed)
stable °

## 2011-07-12 NOTE — Assessment & Plan Note (Addendum)
   This is resolved. We will continue him on his losartan HCT. Post initiation blood work was done over the [facility]

## 2011-07-12 NOTE — Assessment & Plan Note (Signed)
The patient's device was interrogated.  The information was reviewed. No changes were made in the programming.    

## 2011-07-12 NOTE — Progress Notes (Signed)
  HPI  [name] is a 70 y.o. male Seen in followup for atrial flutter status post ablation. He also has a history of complete heart block status post pacemaker. He is without significant symptoms of chest pain shortness of breath or edema. His major issue is his L knee which he hopes to have replaced by Dr Thomasena Edis  He has coronary artery disease - status post circumflex stent in the past.  Cardiac catheterization in October 2011 demonstrated left anterior  descending 30% to 40% stenosis with other nonobstructive disease  with medical therapy recommended.   Past Medical History  Diagnosis Date  . CAD (coronary artery disease)     A.  s/p CFX in the past;   B.  cath 06/2010: LAD 30-40%, D1 50%, OM1 50%, AVCFX stent 30%, dCFX 70-80% (med Rx), mRCA 40%;      C.  Echo 06/2010: EF 60-65%, mod LVH; mild LAE     . Heart block   . 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 Hypertension   . Multiple myeloma   . Sleep apnea   . Degenerative disc disease   . Venous insufficiency   . Cubital tunnel syndrome   . Atrial flutter     A. s/p prior ablation;  B.  s/p CTI ablation 10/24/10 (Dr. Graciela Husbands)    Past Surgical History  Procedure Date  . Coronary angioplasty with stent placement 2005  . Ventral hernia repair   . Right olecranon nerve surgery     Current Outpatient Prescriptions  Medication Sig Dispense Refill  . aspirin 81 MG tablet Take 81 mg by mouth daily.        Marland Kitchen atenolol (TENORMIN) 50 MG tablet Take 1 tablet (50 mg total) by mouth daily.  30 tablet  6  . glucosamine-chondroitin 500-400 MG tablet Take 1 tablet by mouth daily.        Marland Kitchen losartan-hydrochlorothiazide (HYZAAR) 50-12.5 MG per tablet Take 1 tablet by mouth daily.  30 tablet  12  . Multiple Vitamin (MULTIVITAMIN) capsule Take 1 capsule by mouth daily.        . nitroGLYCERIN (NITROSTAT) 0.4 MG SL tablet Place 0.4 mg under the tongue every 5 (five) minutes as needed.        . Omega-3 Fatty Acids (FISH OIL)  1200 MG CAPS Take 1 capsule by mouth daily.          No Known Allergies  Review of Systems negative except from HPI and PMH  Physical Exam Well developed and well nourished in no acute distress HENT normal E scleral and icterus clear Neck Supple JVP flat; carotids brisk and full Clear to ausculation Regular rate and rhythm, no murmurs gallops or rub Soft with active bowel sounds No clubbing cyanosis and edema Alert and oriented, grossly normal motor and sensory function Skin Warm and Dry  ECG  Assessment and  Plan

## 2011-07-12 NOTE — Patient Instructions (Signed)
Remote monitoring is used to monitor your Pacemaker of ICD from home. This monitoring reduces the number of office visits required to check your device to one time per year. It allows [place] to keep an eye on the functioning of your device to ensure it is working properly. You are scheduled for a device check from home on 09/15/11. You may send your transmission at any time that day. If you have a wireless device, the transmission will be sent automatically. After your physician reviews your transmission, you will receive a postcard with your next transmission date.  Your physician wants you to follow-up in: 1 year with Dr. Graciela Husbands. You will receive a reminder letter in the mail two months in advance. If you don't receive a letter, please call our office to schedule the follow-up appointment.  Your physician recommends that you continue on your current medications as directed. Please refer to the Current Medication list given to you today.

## 2011-07-12 NOTE — Assessment & Plan Note (Signed)
A little bit elevated; will continue him on his current medication and have recommended home monitoring

## 2011-07-12 NOTE — Assessment & Plan Note (Signed)
He has had recurrent very brief episodes of atrial fibrillation/tachycardia with atrial cycle length approximately 300 ms. No then has lasted longer than a minute.

## 2011-09-08 ENCOUNTER — Other Ambulatory Visit: Payer: Self-pay | Admitting: Internal Medicine

## 2011-09-15 ENCOUNTER — Encounter: Payer: Medicare Other | Admitting: *Deleted

## 2011-09-19 ENCOUNTER — Encounter: Payer: Self-pay | Admitting: *Deleted

## 2011-09-20 ENCOUNTER — Ambulatory Visit (INDEPENDENT_AMBULATORY_CARE_PROVIDER_SITE_OTHER): Payer: Medicare Other | Admitting: *Deleted

## 2011-09-20 ENCOUNTER — Telehealth: Payer: Self-pay | Admitting: Internal Medicine

## 2011-09-20 DIAGNOSIS — I442 Atrioventricular block, complete: Secondary | ICD-10-CM

## 2011-09-20 NOTE — Telephone Encounter (Signed)
Left message for patient to call [email or web address] 800 # on his transmitter and they can walk him through the transmission process.

## 2011-09-20 NOTE — Telephone Encounter (Signed)
New Msg: Pt calling c/o difficulties send remote device transmission. Please return pt call to advise/discuss.

## 2011-09-21 ENCOUNTER — Encounter: Payer: Self-pay | Admitting: Internal Medicine

## 2011-09-21 ENCOUNTER — Other Ambulatory Visit: Payer: Self-pay | Admitting: Internal Medicine

## 2011-09-21 LAB — REMOTE PACEMAKER DEVICE
AL IMPEDENCE PM: 450 Ohm
AL THRESHOLD: 0.5 V
BAMS-0003: 70 {beats}/min
DEVICE MODEL PM: 7163077
RV LEAD AMPLITUDE: 12 mv
RV LEAD THRESHOLD: 1 V

## 2011-09-22 NOTE — Progress Notes (Signed)
PPM remote 

## 2011-10-04 ENCOUNTER — Encounter: Payer: Self-pay | Admitting: *Deleted

## 2011-10-28 ENCOUNTER — Ambulatory Visit: Payer: Medicare Other | Admitting: Oncology

## 2011-10-28 ENCOUNTER — Other Ambulatory Visit: Payer: Medicare Other | Admitting: Lab

## 2011-11-01 ENCOUNTER — Other Ambulatory Visit: Payer: Medicare Other | Admitting: Lab

## 2011-11-01 ENCOUNTER — Ambulatory Visit: Payer: Medicare Other | Admitting: Oncology

## 2011-11-03 ENCOUNTER — Encounter: Payer: Self-pay | Admitting: Oncology

## 2011-11-03 ENCOUNTER — Ambulatory Visit (HOSPITAL_BASED_OUTPATIENT_CLINIC_OR_DEPARTMENT_OTHER): Payer: Medicare Other | Admitting: Oncology

## 2011-11-03 ENCOUNTER — Telehealth: Payer: Self-pay | Admitting: Oncology

## 2011-11-03 ENCOUNTER — Other Ambulatory Visit (HOSPITAL_BASED_OUTPATIENT_CLINIC_OR_DEPARTMENT_OTHER): Payer: Medicare Other | Admitting: Lab

## 2011-11-03 VITALS — BP 152/86 | HR 92 | Temp 97.4°F | Ht 72.0 in | Wt 232.9 lb

## 2011-11-03 DIAGNOSIS — D472 Monoclonal gammopathy: Secondary | ICD-10-CM

## 2011-11-03 LAB — CBC WITH DIFFERENTIAL/PLATELET
BASO%: 0.7 % (ref 0.0–2.0)
EOS%: 6 % (ref 0.0–7.0)
HCT: 40.1 % (ref 38.4–49.9)
LYMPH%: 18.8 % (ref 14.0–49.0)
MCH: 33.6 pg — ABNORMAL HIGH (ref 27.2–33.4)
MCHC: 35.7 g/dL (ref 32.0–36.0)
NEUT%: 61.5 % (ref 39.0–75.0)
Platelets: 203 10*3/uL (ref 140–400)
RBC: 4.26 10*6/uL (ref 4.20–5.82)
WBC: 5.7 10*3/uL (ref 4.0–10.3)
lymph#: 1.1 10*3/uL (ref 0.9–3.3)
nRBC: 0 % (ref 0–0)

## 2011-11-03 NOTE — Progress Notes (Signed)
CC:   [name], M.D. [name]. [name] Emms, MD, [facility]  PROBLEM LIST: 1. 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  In addition, the     patient had negative metastatic bone survey, negative urine     studies, specifically urine immunofixation electrophoresis, and CT     scans of chest, abdomen, and pelvis carried out on 03/12/2010 that     were negative.  Bone marrow was carried out on 04/15/2010. 2. Permanent dual-chamber St. Jude pacemaker in the left chest, placed     on 07/02/2010. 3. History of atrial fibrillation, status post ablation procedure in     June 2010. 4. Coronary artery disease. 5. Dyslipidemia. 6. Hypertension. 7. Diverticulosis with history of gastrointestinal bleeding. 8. Benign prostatic hypertrophy. 9. Sleep apnea on CPAP. 10.Degenerative disk disease. 11.Degenerative arthritis involving the knees. 12.Elevation of AST and ALT, first noted in February 2012.  MEDICATIONS: 1. Aspirin 81 mg daily. 2. Atenolol 50 mg daily. 3. Glucosamine chondroitin 500/400 one tablet daily. 4. Hyzaar 1 tablet daily. 5. Multivitamins. 6. Nitroglycerin 0.4 mg as needed sublingually. 7. Omega-3 fatty acids 1200 mg daily.  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  The patient was 1st seen by [place] on 02/23/2010 and was seen by [place] most recently on 05/26/2011.  He has undergone an evaluation as described above during the summer of 2011 with no evidence to support the diagnosis of multiple myeloma or lymphoma.  The patient's IgM level is being followed and is actually less now than it was when we 1st saw him.  He denies any symptoms to suggest an underlying lymphoplasmacytic disorder.  In general, he feels fairly well.  He denies any musculoskeletal  pains except for problems with his knees.  His condition generally has been stable.  PHYSICAL EXAMINATION:  Vital Signs:  The patient is overweight, weighing 232 pounds 14.4 ounces without major change.  Height 6 feet even, body surface area 2.32 sq m.  Blood pressure today 152/86.  Other vital signs are normal.  His pulse is regular.  HEENT:  There is no scleral icterus, although he does have some sagging of his right upper eyelid.  This has been noted by his ophthalmologist, Dr. Dione [name].  Mouth and pharynx are benign.  Lymph:  No peripheral adenopathy palpable in the neck, supraclavicular, axillary, or inguinal areas.  Heart and Lungs:  Normal. He has a pacemaker in the left infraclavicular region.  Abdomen:  Obese, nontender with no organomegaly or masses palpable.  There is a diastasis recti.  Extremities:  Trace edema of the lower legs.  Neurologic Exam: Grossly normal.  He has some ecchymoses over his distal upper extremities.  LABORATORY DATA:  Today white count 5.7, ANC 3.5, hemoglobin 14.3, hematocrit 40.1, platelets 203,000.  Chemistries and quantitative immunoglobulins today are pending.  Chemistries from 05/26/2011 were notable for an AST of 59, ALT of 56, sodium of 131.  On 09/13 the IgM level was 4150 with normal being 41 to 251.  IgG level was 401 which is low with normal being 650 to 1600 and IgA level was 114 which is in the normal range.  Overall the patient's quantitative immunoglobulins have been more or less stable.  However, the IgM level was in the 5000 range  back in June and July 2011 and now more recent values have actually been less than the initial values.  Of note, of course, is the fact that the patient does have a low IgG level.  IMAGING STUDIES: 1. CT scan of chest, abdomen, and pelvis with IV contrast on     03/12/2010 showed possible focal fatty infiltration with no focal     hepatic lesions.  There was no evidence for metastatic disease in     the  abdomen or pelvis or evidence of multiple myeloma or lymphoma.     Chest CT was negative. 2. Metastatic bone survey from 03/19/2010 showed no evidence for     multiple myeloma. 3. Chest x-ray, 2 views, from 07/03/2010 showed minimal streaky     basilar atelectasis.  Pacer wires were in good position without     complicating features.  IMPRESSION AND PLAN:  [name] continues to do well with no evidence of progression and certainly no signs of active lymphoproliferative disorder.  He certainly does have a significantly elevated IgM level and a low IgG.  These bear careful observation, but I do not think the patient warrants any additional studies at this time.  We will plan to see him again in 6 months, at which time we will check CBC and chemistries, including an LDH, as well as quantitative immunoglobulins.    ______________________________ Samul Dada, M.D. DSM/MEDQ  D:  11/03/2011  T:  11/03/2011  Job:  [id number]

## 2011-11-03 NOTE — Telephone Encounter (Signed)
APPT MADE AND PRINTED FOR 8/20 AOM

## 2011-11-03 NOTE — Progress Notes (Signed)
This office note has been dictated.  #[id number]

## 2011-11-04 ENCOUNTER — Telehealth: Payer: Self-pay

## 2011-11-04 LAB — IGG, IGA, IGM
IgA: 102 mg/dL (ref 68–379)
IgM, Serum: 3760 mg/dL — ABNORMAL HIGH (ref 41–251)

## 2011-11-04 LAB — COMPREHENSIVE METABOLIC PANEL
ALT: 31 U/L (ref 0–53)
AST: 38 U/L — ABNORMAL HIGH (ref 0–37)
Alkaline Phosphatase: 62 U/L (ref 39–117)
BUN: 9 mg/dL (ref 6–23)
Creatinine, Ser: 0.7 mg/dL (ref 0.50–1.35)

## 2011-11-04 NOTE — Telephone Encounter (Signed)
S/w pt and gave him the IgG, IgA, and IgM results from 2/21 per his request

## 2011-11-17 ENCOUNTER — Encounter: Payer: Self-pay | Admitting: Cardiovascular Disease

## 2011-11-17 ENCOUNTER — Ambulatory Visit (INDEPENDENT_AMBULATORY_CARE_PROVIDER_SITE_OTHER): Payer: Medicare Other | Admitting: Cardiovascular Disease

## 2011-11-17 DIAGNOSIS — I10 Essential (primary) hypertension: Secondary | ICD-10-CM

## 2011-11-17 DIAGNOSIS — I495 Sick sinus syndrome: Secondary | ICD-10-CM

## 2011-11-17 DIAGNOSIS — C9000 Multiple myeloma not having achieved remission: Secondary | ICD-10-CM

## 2011-11-17 DIAGNOSIS — I2510 Atherosclerotic heart disease of native coronary artery without angina pectoris: Secondary | ICD-10-CM

## 2011-11-17 NOTE — Assessment & Plan Note (Signed)
MGUS  Renal function ok  F/U [place]

## 2011-11-17 NOTE — Assessment & Plan Note (Signed)
Stable with no angina and good activity level.  Continue medical Rx  

## 2011-11-17 NOTE — Progress Notes (Signed)
[name] is a 71 y.o. male Seen in followup for atrial flutter status post ablation. He also has a history of complete heart block status post pacemaker. He is without significant symptoms of chest pain shortness of breath or edema. His major issue is his L knee which he hopes to have replaced by Dr Thomasena Edis  He has coronary artery disease - status post circumflex stent in the past.Cardiac catheterization in October 2011 demonstrated left anterior  descending 30% to 40% stenosis with other nonobstructive disease with medical therapy recommended.  Has Merlin machine to check pacer at home Has a girlfriend and I told him he's ok to be intimate Discussed SBE prophylaxis and I told him he did not need it  ROS: Denies fever, malais, weight loss, blurry vision, decreased visual acuity, cough, sputum, SOB, hemoptysis, pleuritic pain, palpitaitons, heartburn, abdominal pain, melena, lower extremity edema, claudication, or rash.  All other systems reviewed and negative  General: Affect appropriate Healthy:  appears stated age HEENT: normal Neck supple with no adenopathy JVP normal no bruits no thyromegaly Lungs clear with no wheezing and good diaphragmatic motion Heart:  S1/S2 no murmur, no rub, gallop or click PMI normal Abdomen: benighn, BS positve, no tenderness, no AAA no bruit.  No HSM or HJR Distal pulses intact with no bruits No edema Neuro non-focal Skin warm and dry No muscular weakness   Current Outpatient Prescriptions  Medication Sig Dispense Refill  . aspirin 81 MG tablet Take 81 mg by mouth daily.        Marland Kitchen atenolol (TENORMIN) 50 MG tablet TAKE ONE TABLET BY MOUTH EVERY DAY  90 tablet  3  . glucosamine-chondroitin 500-400 MG tablet Take 1 tablet by mouth daily.        Marland Kitchen losartan-hydrochlorothiazide (HYZAAR) 50-12.5 MG per tablet Take 1 tablet by mouth daily.  30 tablet  12  . Multiple Vitamin (MULTIVITAMIN) capsule Take 1 capsule by mouth daily.        . nitroGLYCERIN  (NITROSTAT) 0.4 MG SL tablet Place 0.4 mg under the tongue every 5 (five) minutes as needed.        . NON FORMULARY CPAP Machine at night      . Omega-3 Fatty Acids (FISH OIL) 1200 MG CAPS Take 1 capsule by mouth daily.          Allergies  Review of patient's allergies indicates not on file.  Electrocardiogram:  Assessment and Plan

## 2011-11-17 NOTE — Patient Instructions (Signed)
Your physician wants you to follow-up in:  6 MONTHS WITH DR NISHAN  You will receive a reminder letter in the mail two months in advance. If you don't receive a letter, please call our office to schedule the follow-up appointment. Your physician recommends that you continue on your current medications as directed. Please refer to the Current Medication list given to you today. 

## 2011-11-17 NOTE — Assessment & Plan Note (Signed)
Normal functioning pacer.  [employer]lin transmission 4 weeks

## 2011-11-17 NOTE — Assessment & Plan Note (Signed)
Well controlled.  Continue current medications and low sodium Dash type diet.    

## 2011-12-22 ENCOUNTER — Ambulatory Visit (INDEPENDENT_AMBULATORY_CARE_PROVIDER_SITE_OTHER): Payer: Medicare Other | Admitting: *Deleted

## 2011-12-22 ENCOUNTER — Encounter: Payer: Self-pay | Admitting: Internal Medicine

## 2011-12-22 ENCOUNTER — Telehealth: Payer: Self-pay | Admitting: Internal Medicine

## 2011-12-22 DIAGNOSIS — I442 Atrioventricular block, complete: Secondary | ICD-10-CM

## 2011-12-22 NOTE — Telephone Encounter (Signed)
error 

## 2011-12-23 LAB — REMOTE PACEMAKER DEVICE
AL AMPLITUDE: 3 mv
AL THRESHOLD: 0.5 V
BAMS-0001: 150 {beats}/min
DEVICE MODEL PM: 7163077
RV LEAD AMPLITUDE: 12 mv
RV LEAD THRESHOLD: 0.875 V

## 2011-12-29 NOTE — Progress Notes (Signed)
Remote pacer check  

## 2012-01-10 ENCOUNTER — Encounter: Payer: Self-pay | Admitting: *Deleted

## 2012-03-05 ENCOUNTER — Telehealth: Payer: Self-pay | Admitting: Cardiovascular Disease

## 2012-03-05 ENCOUNTER — Telehealth: Payer: Self-pay | Admitting: Cardiology

## 2012-03-05 NOTE — Telephone Encounter (Signed)
New msg Pt has questions about his meds.

## 2012-03-05 NOTE — Telephone Encounter (Signed)
New msg Pt has question about his remote transmission. Please call

## 2012-03-06 NOTE — Telephone Encounter (Signed)
He got a letter from [facility] and it says he is no longer taken staton drugs and he wants to let them know why he not on one

## 2012-03-06 NOTE — Telephone Encounter (Signed)
Spoke with pt and rescheduled merlin transmission for 03-26-12.

## 2012-03-12 NOTE — Telephone Encounter (Signed)
LMTCB ./CY 

## 2012-03-13 NOTE — Telephone Encounter (Signed)
Fu call Pt is calling back

## 2012-03-13 NOTE — Telephone Encounter (Signed)
PT IS SCHEDULED FOR LABS IN OCTOBER AND CPX   WITH DR AWA  WILL AWAIT LAB RESULTS TO DECIDE ON [facility] .Zack Seal

## 2012-03-26 ENCOUNTER — Encounter: Payer: Self-pay | Admitting: Internal Medicine

## 2012-03-26 ENCOUNTER — Ambulatory Visit (INDEPENDENT_AMBULATORY_CARE_PROVIDER_SITE_OTHER): Payer: Medicare Other | Admitting: *Deleted

## 2012-03-26 DIAGNOSIS — I442 Atrioventricular block, complete: Secondary | ICD-10-CM

## 2012-03-30 LAB — REMOTE PACEMAKER DEVICE
ATRIAL PACING PM: 58
BATTERY VOLTAGE: 2.95 V
RV LEAD IMPEDENCE PM: 530 Ohm
RV LEAD THRESHOLD: 0.875 V
VENTRICULAR PACING PM: 100

## 2012-04-26 ENCOUNTER — Encounter: Payer: Self-pay | Admitting: *Deleted

## 2012-05-01 ENCOUNTER — Other Ambulatory Visit: Payer: Medicare Other | Admitting: Lab

## 2012-05-01 ENCOUNTER — Ambulatory Visit: Payer: Medicare Other | Admitting: Oncology

## 2012-05-21 ENCOUNTER — Telehealth: Payer: Self-pay | Admitting: Oncology

## 2012-05-21 ENCOUNTER — Ambulatory Visit (HOSPITAL_BASED_OUTPATIENT_CLINIC_OR_DEPARTMENT_OTHER): Payer: Medicare Other | Admitting: Family

## 2012-05-21 ENCOUNTER — Encounter: Payer: Self-pay | Admitting: Family

## 2012-05-21 ENCOUNTER — Other Ambulatory Visit (HOSPITAL_BASED_OUTPATIENT_CLINIC_OR_DEPARTMENT_OTHER): Payer: Medicare Other | Admitting: Lab

## 2012-05-21 VITALS — BP 107/56 | HR 70 | Temp 97.7°F | Resp 20 | Ht 72.0 in | Wt 224.2 lb

## 2012-05-21 DIAGNOSIS — D472 Monoclonal gammopathy: Secondary | ICD-10-CM

## 2012-05-21 DIAGNOSIS — I2510 Atherosclerotic heart disease of native coronary artery without angina pectoris: Secondary | ICD-10-CM

## 2012-05-21 LAB — CBC WITH DIFFERENTIAL/PLATELET
Eosinophils Absolute: 0.5 10*3/uL (ref 0.0–0.5)
LYMPH%: 25.4 % (ref 14.0–49.0)
MONO#: 0.6 10*3/uL (ref 0.1–0.9)
NEUT#: 3.1 10*3/uL (ref 1.5–6.5)
Platelets: 191 10*3/uL (ref 140–400)
RBC: 3.68 10*6/uL — ABNORMAL LOW (ref 4.20–5.82)
RDW: 12.6 % (ref 11.0–14.6)
WBC: 5.6 10*3/uL (ref 4.0–10.3)

## 2012-05-21 LAB — COMPREHENSIVE METABOLIC PANEL (CC13)
ALT: 81 U/L — ABNORMAL HIGH (ref 0–55)
AST: 92 U/L — ABNORMAL HIGH (ref 5–34)
Albumin: 3.6 g/dL (ref 3.5–5.0)
Alkaline Phosphatase: 72 U/L (ref 40–150)
Potassium: 3.8 mEq/L (ref 3.5–5.1)
Sodium: 129 mEq/L — ABNORMAL LOW (ref 136–145)
Total Protein: 8.2 g/dL (ref 6.4–8.3)

## 2012-05-21 NOTE — Progress Notes (Signed)
Patient ID: [name], male   DOB: [birth year], 71 y.o.   MRN: [id number] CSN: [id number]  CC: [name], M.D.  [name] Pick. Eden Emms, MD, [facility]  Problem List: NAHSIR [name] is a 71 y.o. Caucasian male with a problem list consisting of:  1. 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 In addition, the patient had negative metastatic bone survey, negative urine studies, specifically urine immunofixation electrophoresis, and CT scans of chest, abdomen, and pelvis carried out on 03/12/2010 that were negative. Bone marrow was carried out on 04/15/2010.  2. Permanent dual-chamber St. Jude pacemaker in the left chest, placed on 07/02/2010.  3. History of atrial fibrillation, status post ablation procedure in June 2010.  4. Coronary artery disease.  5. Dyslipidemia.  6. Hypertension.  7. Diverticulosis with history of gastrointestinal bleeding.  8. Benign prostatic hypertrophy.  9. Sleep apnea on CPAP.  10.Degenerative disk disease.  11.Degenerative arthritis involving the knees.  12.Elevation of AST and ALT, first noted in February 2012.   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 The patient was 1st seen by [place] on 02/23/2010 and was seen by [place] most recently on 11/03/2011 He has undergone an evaluation as described above during the summer of 2011 with no evidence to support the diagnosis of multiple myeloma or lymphoma. The patient's IgM level is being followed and is actually less now than it was when we 1st saw him. He denies any symptoms to suggest an underlying lymphoplasmacytic disorder. His condition generally has been stable.   In general he feels fairly well, although the patient stated that he "tripped" a few weeks ago and landed on his tail bone which is still sore.  The patient was  unsure whether or not he hit his head.  We asked the patient to report the fall to his PCP.  He denies any other musculoskeletal pains except for problems with his knees.  Dr. Arline Asp spoke to the patient about his elevated liver enzymes and the patient's admitted alcohol consumption of at least 4-5 beers daily.  The patient stated that he had a couple of beers before his office visit today.  We asked the patient to reduce if not eliminate his alcohol consumption.   He denies any other symptomatology.  Past Medical History: Past Medical History  Diagnosis Date  . CAD (coronary artery disease)     A.  s/p CFX in the past;   B.  cath 06/2010: LAD 30-40%, D1 50%, OM1 50%, AVCFX stent 30%, dCFX 70-80% (med Rx), mRCA 40%;      C.  Echo 06/2010: EF 60-65%, mod LVH; mild LAE     . Heart block   . 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 Hypertension   . Multiple myeloma   . Sleep apnea   . Degenerative disc disease   . Venous insufficiency   . Cubital tunnel syndrome   . Atrial flutter     A. s/p prior ablation;  B.  s/p CTI ablation 10/24/10 (Dr. Graciela Husbands)    Surgical History: Past Surgical History  Procedure Date  . Coronary angioplasty with stent placement 2005  . Ventral hernia repair   . Right olecranon nerve surgery     Current Medications: Current Outpatient Prescriptions  Medication Sig Dispense Refill  . aspirin 81  MG tablet Take 81 mg by mouth daily.        Marland Kitchen atenolol (TENORMIN) 50 MG tablet TAKE ONE TABLET BY MOUTH EVERY DAY  90 tablet  3  . glucosamine-chondroitin 500-400 MG tablet Take 1 tablet by mouth daily.        . Multiple Vitamin (MULTIVITAMIN) capsule Take 1 capsule by mouth daily.        . nitroGLYCERIN (NITROSTAT) 0.4 MG SL tablet Place 0.4 mg under the tongue every 5 (five) minutes as needed.        . NON FORMULARY CPAP Machine at night      . Omega-3 Fatty Acids (FISH OIL) 1200 MG CAPS Take 1 capsule by mouth daily.        Marland Kitchen  losartan-hydrochlorothiazide (HYZAAR) 50-12.5 MG per tablet Take 1 tablet by mouth daily.  30 tablet  12    Allergies: No Known Allergies  Family History: Family History  Problem Relation Age of Onset  . Heart disease Father   . Cancer Father     prostate  . Heart disease Brother     Social History: History  Substance Use Topics  . Smoking status: Former Smoker -- 1.0 packs/day for 49 years    Types: Cigarettes    Quit date: 09/12/2004  . Smokeless tobacco: Former Neurosurgeon   Comment: started at age 45; smoked 1 ppd; quit in 2006  . Alcohol Use: 14.0 oz/week    28 drink(s) per week     occasional    Review of Systems: 10 Point review of systems was completed and is negative except as noted above.   Physical Exam:   Blood pressure 107/56, pulse 70, temperature 97.7 F (36.5 C), temperature source Oral, resp. rate 20, height 6' (1.829 m), weight 224 lb 3.2 oz (101.696 kg).  General appearance: Alert, cooperative, unkempt, no acute distress Head: Normocephalic, without obvious abnormality, atraumatic Eyes: Conjunctivae injected, corneas are cloudy, PERRLA, EOMI Ears: Bilateral hearing aids Neck: No adenopathy, supple, symmetrical, trachea midline, thyroid not enlarged, no tenderness Back: Coccyx tenderness,  ROM normal,  no CVA tenderness Resp: Diminished breath sounds bibasilar Cardio: Regular rate and rhythm, S1, S2 normal, no murmur, click, rub or gallop, left chest implanted Pacemaker GI: Firm, distended, non-tender, hypoactive bowel sounds Extremities: LE limited ROM, atraumatic, no cyanosis or edema, varicose veins in LEs Skin: Ecchymoses - generalized and hyperpigmentation - extremities and trunk Lymph nodes: Cervical, supraclavicular, and axillary nodes normal Neurologic: Grossly normal   Laboratory Data: Results for orders placed in visit on 05/21/12 (from the past 48 hour(s))  CBC WITH DIFFERENTIAL     Status: Abnormal   Collection Time   05/21/12  3:16 PM       Component Value Range Comment   WBC 5.6  4.0 - 10.3 10e3/uL    NEUT# 3.1  1.5 - 6.5 10e3/uL    HGB 13.3  13.0 - 17.1 g/dL    HCT 91.4 (*) 78.2 - 49.9 %    Platelets 191  140 - 400 10e3/uL    MCV 98.2 (*) 79.3 - 98.0 fL    MCH 36.2 (*) 27.2 - 33.4 pg    MCHC 36.8 (*) 32.0 - 36.0 g/dL    RBC 9.56 (*) 2.13 - 5.82 10e6/uL    RDW 12.6  11.0 - 14.6 %    lymph# 1.4  0.9 - 3.3 10e3/uL    MONO# 0.6  0.1 - 0.9 10e3/uL    Eosinophils Absolute 0.5  0.0 - 0.5  10e3/uL    Basophils Absolute 0.0  0.0 - 0.1 10e3/uL    NEUT% 54.8  39.0 - 75.0 %    LYMPH% 25.4  14.0 - 49.0 %    MONO% 11.3  0.0 - 14.0 %    EOS% 8.1 (*) 0.0 - 7.0 %    BASO% 0.4  0.0 - 2.0 %   COMPREHENSIVE METABOLIC PANEL (CC13)     Status: Abnormal   Collection Time   05/21/12  3:16 PM      Component Value Range Comment   Sodium 129 (*) 136 - 145 mEq/L    Potassium 3.8  3.5 - 5.1 mEq/L    Chloride 93 (*) 98 - 107 mEq/L    CO2 24  22 - 29 mEq/L    Glucose 97  70 - 99 mg/dl    BUN 6.0 (*) 7.0 - 16.1 mg/dL    Creatinine 0.8  0.7 - 1.3 mg/dL    Total Bilirubin 0.96  0.20 - 1.20 mg/dL    Alkaline Phosphatase 72  40 - 150 U/L    AST 92 (*) 5 - 34 U/L    ALT 81 (*) 0 - 55 U/L    Total Protein 8.2  6.4 - 8.3 g/dL    Albumin 3.6  3.5 - 5.0 g/dL    Calcium 9.1  8.4 - 04.5 mg/dL   LACTATE DEHYDROGENASE (CC13)     Status: Abnormal   Collection Time   05/21/12  3:16 PM      Component Value Range Comment   LDH 252 (*) 125 - 220 U/L      Imaging Studies: 1. CT scan of chest, abdomen, and pelvis with IV contrast on 03/12/2010 showed possible focal fatty infiltration with no focal  hepatic lesions. There was no evidence for metastatic disease in the abdomen or pelvis or evidence of multiple myeloma or lymphoma. Chest CT was negative.  2. Metastatic bone survey from 03/19/2010 showed no evidence for multiple myeloma.  3. Chest x-ray, 2 views, from 07/03/2010 showed minimal streaky basilar atelectasis. Pacer wires were in good position  without  complicating features.   Impression/Plan: Mr. Rineer is without  evidence of progression and no signs of active lymphoproliferative disorder. Quantitative immunoglobulins are pending, but he does have a trend of having significantly elevated IgM level and a low IgG. We will observe these laboratory values carefully.  Dr.  Arline Asp does not believe the patient warrants any additional studies at this time. We will plan to see him again in 1 months' time to check LDH level.  We will schedule another office visit for March, 2014 (6 months' time) at which time we will check CBC and chemistries, including an LDH, as well as quantitative immunoglobulins.    [name] Bras, NP-C 05/21/2012, 4:28 PM

## 2012-05-21 NOTE — Telephone Encounter (Signed)
gv pt appt schedule for October 2013 and march 2014.

## 2012-05-21 NOTE — Patient Instructions (Addendum)
Patient ID: [name],   DOB: [birth year],  MRN: [id number]   [facility] Discharge Instructions  RECOMMENDATIONS MAD BY THE CONSULTANT AND ANY TEST RESULT(S) WILL BE FORWARDED TO YOU REFERRING DOCTOR   EXAM FINDINGS BY NURSE PRACTITIONER TODAY TO REPORT TO THE CLINIC OR PRIMARY PROVIDER: Elevated liver values are concerning.  Please cut down alcoholic beverage consumption significantly and drink more water.   Your Current Medications Are: Current Outpatient Prescriptions  Medication Sig Dispense Refill  . aspirin 81 MG tablet Take 81 mg by mouth daily.        Marland Kitchen atenolol (TENORMIN) 50 MG tablet TAKE ONE TABLET BY MOUTH EVERY DAY  90 tablet  3  . glucosamine-chondroitin 500-400 MG tablet Take 1 tablet by mouth daily.        . Multiple Vitamin (MULTIVITAMIN) capsule Take 1 capsule by mouth daily.        . nitroGLYCERIN (NITROSTAT) 0.4 MG SL tablet Place 0.4 mg under the tongue every 5 (five) minutes as needed.        . NON FORMULARY CPAP Machine at night      . Omega-3 Fatty Acids (FISH OIL) 1200 MG CAPS Take 1 capsule by mouth daily.        Marland Kitchen losartan-hydrochlorothiazide (HYZAAR) 50-12.5 MG per tablet Take 1 tablet by mouth daily.  30 tablet  12     INSTRUCTIONS GIVEN, DISCUSSED AND FOLLOW-UP: Elevated liver values are concerning.  Please cut down alcoholic beverage consumption significantly and drink more water.  I acknowledge that I have been informed and understand all the instructions given to me and have received a copy.  I do not have any further questions at this time, but I understand that I may call the [facility] at [phone] during business hours should I have any further questions or need assistance in obtaining follow-up care.   05/21/2012, 4:55 PM

## 2012-05-22 ENCOUNTER — Telehealth: Payer: Self-pay

## 2012-05-22 LAB — IGG, IGA, IGM
IgA: 101 mg/dL (ref 68–379)
IgG (Immunoglobin G), Serum: 370 mg/dL — ABNORMAL LOW (ref 650–1600)

## 2012-05-22 NOTE — Telephone Encounter (Signed)
Faxed labs from 05/21/12 to Dr Felipa Eth

## 2012-05-25 ENCOUNTER — Telehealth: Payer: Self-pay | Admitting: Medical Oncology

## 2012-05-25 NOTE — Telephone Encounter (Signed)
Pt called to get his IgA, IgG and IgM levels from 05/21/12

## 2012-06-02 ENCOUNTER — Other Ambulatory Visit: Payer: Self-pay | Admitting: Cardiovascular Disease

## 2012-06-13 ENCOUNTER — Ambulatory Visit: Payer: Medicare Other | Admitting: Pulmonary Disease

## 2012-06-19 ENCOUNTER — Encounter: Payer: Self-pay | Admitting: Pulmonary Disease

## 2012-06-19 ENCOUNTER — Telehealth: Payer: Self-pay | Admitting: Pulmonary Disease

## 2012-06-19 ENCOUNTER — Other Ambulatory Visit (HOSPITAL_BASED_OUTPATIENT_CLINIC_OR_DEPARTMENT_OTHER): Payer: Medicare Other | Admitting: Lab

## 2012-06-19 ENCOUNTER — Ambulatory Visit (INDEPENDENT_AMBULATORY_CARE_PROVIDER_SITE_OTHER): Payer: Medicare Other | Admitting: Pulmonary Disease

## 2012-06-19 VITALS — BP 130/72 | HR 64 | Ht 72.0 in | Wt 222.0 lb

## 2012-06-19 DIAGNOSIS — D472 Monoclonal gammopathy: Secondary | ICD-10-CM

## 2012-06-19 DIAGNOSIS — G4733 Obstructive sleep apnea (adult) (pediatric): Secondary | ICD-10-CM

## 2012-06-19 NOTE — Telephone Encounter (Signed)
Called, spoke with pt.  Pt states [facility] wanted him to call to let us know pressure of cpap so we could put in his chart.  Pt reports this is set at 12.  Will route to [facility] so he is aware.

## 2012-06-19 NOTE — Patient Instructions (Addendum)
Continue with cpap, and keep up with supplies and mask cushions Work on weight loss followup with me in one year if doing well.

## 2012-06-19 NOTE — Assessment & Plan Note (Signed)
The patient is doing extremely well with CPAP, and has even lost weight since the last visit.  I've asked him to continue with his current therapy, and also to continue with his weight loss.  I have also encouraged him to keep up with his supplies and mask changes.  He will followup with me in one year if doing well.

## 2012-06-19 NOTE — Progress Notes (Signed)
  Subjective:    Patient ID: [name], male    DOB: 01/11/1941, 71 y.o.   MRN: [id number]  HPI The patient comes in today for followup of his known obstructive sleep apnea.  He is wearing CPAP compliantly, and is having no issues with his mask fit or pressure.  He feels he is sleeping well with the device, and is not having any significant daytime sleepiness.  He has lost 8 pounds since his last visit.   Review of Systems  Constitutional: Negative for fever and unexpected weight change.  HENT: Positive for congestion, rhinorrhea and sinus pressure ( am only ). Negative for ear pain, nosebleeds, sore throat, sneezing, trouble swallowing, dental problem and postnasal drip.   Eyes: Negative for redness and itching.  Respiratory: Negative for cough, chest tightness, shortness of breath and wheezing.   Cardiovascular: Negative for palpitations and leg swelling.  Gastrointestinal: Negative for nausea and vomiting.  Genitourinary: Negative for dysuria.  Musculoskeletal: Negative for joint swelling.  Skin: Negative for rash.  Neurological: Negative for headaches.  Hematological: Bruises/bleeds easily.  Psychiatric/Behavioral: Negative for dysphoric mood. The patient is not nervous/anxious.        Objective:   Physical Exam Well-developed male in no acute distress No skin breakdown or pressure necrosis from the CPAP mask Neck without lymphadenopathy or thyromegaly Lower extremities without edema, no cyanosis Alert and oriented, does not appear to be sleepy, moves all 4 extremities.       Assessment & Plan:

## 2012-06-20 ENCOUNTER — Telehealth: Payer: Self-pay | Admitting: Medical Oncology

## 2012-06-20 NOTE — Telephone Encounter (Signed)
I called pt per Dr. Arline Asp to inform him his LDH result was normal yesterday. If any questions please call.

## 2012-07-04 ENCOUNTER — Encounter: Payer: Self-pay | Admitting: Internal Medicine

## 2012-07-04 ENCOUNTER — Encounter: Payer: Self-pay | Admitting: Cardiovascular Disease

## 2012-07-04 ENCOUNTER — Ambulatory Visit (INDEPENDENT_AMBULATORY_CARE_PROVIDER_SITE_OTHER): Payer: Medicare Other | Admitting: Internal Medicine

## 2012-07-04 ENCOUNTER — Ambulatory Visit (INDEPENDENT_AMBULATORY_CARE_PROVIDER_SITE_OTHER): Payer: Medicare Other | Admitting: Cardiovascular Disease

## 2012-07-04 VITALS — BP 132/74 | HR 67 | Ht 71.0 in | Wt 217.0 lb

## 2012-07-04 DIAGNOSIS — I442 Atrioventricular block, complete: Secondary | ICD-10-CM

## 2012-07-04 DIAGNOSIS — I10 Essential (primary) hypertension: Secondary | ICD-10-CM

## 2012-07-04 DIAGNOSIS — I471 Supraventricular tachycardia: Secondary | ICD-10-CM

## 2012-07-04 DIAGNOSIS — I495 Sick sinus syndrome: Secondary | ICD-10-CM

## 2012-07-04 DIAGNOSIS — D472 Monoclonal gammopathy: Secondary | ICD-10-CM

## 2012-07-04 DIAGNOSIS — Z950 Presence of cardiac pacemaker: Secondary | ICD-10-CM

## 2012-07-04 DIAGNOSIS — I498 Other specified cardiac arrhythmias: Secondary | ICD-10-CM

## 2012-07-04 LAB — PACEMAKER DEVICE OBSERVATION
AL IMPEDENCE PM: 425 Ohm
ATRIAL PACING PM: 66
BAMS-0003: 70 {beats}/min
BATTERY VOLTAGE: 2.9629 V

## 2012-07-04 NOTE — Assessment & Plan Note (Signed)
60% atrial pacing

## 2012-07-04 NOTE — Assessment & Plan Note (Signed)
Currently conducting 1:1. He has a long PR interval. He is 100% ventricularly paced

## 2012-07-04 NOTE — Assessment & Plan Note (Signed)
Well controlled.  Continue current medications and low sodium Dash type diet.    

## 2012-07-04 NOTE — Assessment & Plan Note (Signed)
Normal pacer function and remote telemetry with [place] [facility] today

## 2012-07-04 NOTE — Assessment & Plan Note (Signed)
The patient's device was interrogated.  The information was reviewed. No changes were made in the programming.    

## 2012-07-04 NOTE — Assessment & Plan Note (Signed)
No intercurrent atrial fibrillation detected 

## 2012-07-04 NOTE — Progress Notes (Signed)
Patient ID: [name], male   DOB: 02/29/1941, 71 y.o.   MRN: [id number] [name] is a 71 y.o. male Seen in followup for atrial flutter status post ablation. He also has a history of complete heart block status post pacemaker. He is without significant symptoms of chest pain shortness of breath or edema.    He has coronary artery disease - status post circumflex stent in the past.Cardiac catheterization in October 2011 demonstrated left anterior  descending 30% to 40% stenosis with other nonobstructive disease with medical therapy recommended.   Has Merlin machine to check pacer at home Will see Dr Graciela Husbands today  Still with urologic issues and impotence.     ROS: Denies fever, malais, weight loss, blurry vision, decreased visual acuity, cough, sputum, SOB, hemoptysis, pleuritic pain, palpitaitons, heartburn, abdominal pain, melena, lower extremity edema, claudication, or rash.  All other systems reviewed and negative  General: Affect appropriate Healthy:  appears stated age HEENT: normal Neck supple with no adenopathy JVP normal no bruits no thyromegaly Lungs clear with no wheezing and good diaphragmatic motion Heart:  S1/S2 no murmur, no rub, gallop or click PMI normal Abdomen: benighn, BS positve, no tenderness, no AAA no bruit.  No HSM or HJR Distal pulses intact with no bruits No edema Neuro non-focal Skin warm and dry No muscular weakness   Current Outpatient Prescriptions  Medication Sig Dispense Refill  . aspirin 81 MG tablet Take 81 mg by mouth daily.        Marland Kitchen atenolol (TENORMIN) 50 MG tablet TAKE ONE TABLET BY MOUTH EVERY DAY  90 tablet  3  . glucosamine-chondroitin 500-400 MG tablet Take 1 tablet by mouth daily.        Marland Kitchen losartan-hydrochlorothiazide (HYZAAR) 50-12.5 MG per tablet TAKE ONE TABLET BY MOUTH EVERY DAY  30 tablet  11  . Multiple Vitamin (MULTIVITAMIN) capsule Take 1 capsule by mouth daily.        . nitroGLYCERIN (NITROSTAT) 0.4 MG SL tablet Place  0.4 mg under the tongue every 5 (five) minutes as needed.        . NON FORMULARY CPAP Machine at night      . Omega-3 Fatty Acids (FISH OIL) 1200 MG CAPS Take 1 capsule by mouth daily.        Marland Kitchen oxyCODONE-acetaminophen (PERCOCET/ROXICET) 5-325 MG per tablet every 4 (four) hours as needed.       . SF 5000 PLUS 1.1 % CREA dental cream as needed.         Allergies  Review of patient's allergies indicates no known allergies.  Electrocardiogram:  A pacing LBBB  Assessment and Plan

## 2012-07-04 NOTE — Progress Notes (Signed)
Patient Care Team: Hoyle Sauer, MD as PCP - General (Internal Medicine) Samul Dada, MD (Hematology and Oncology)   HPI  [name] is a 71 y.o. male Seen in followup for  a history of complete heart block status post pacemaker 2011  He has a history of prior catheter ablation for atrial flutter. If coronary artery disease with prior stenting.     Cardiac catheterization in October 2011 demonstrated left anterior  descending 30% to 40% stenosis with other nonobstructive disease  with medical therapy recommended.  He saw Dr. Eden Emms this morning.  No significant shortness of breath or chest pain. He does describe an accident on his fishing boat where he hurt his ribs.   Past Medical History  Diagnosis Date  . CAD (coronary artery disease)     A.  s/p CFX in the past;   B.  cath 06/2010: LAD 30-40%, D1 50%, OM1 50%, AVCFX stent 30%, dCFX 70-80% (med Rx), mRCA 40%;      C.  Echo 06/2010: EF 60-65%, mod LVH; mild LAE     . Heart block   . 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 Hypertension   . Multiple myeloma(203.0)   . Sleep apnea   . Degenerative disc disease   . Venous insufficiency   . Cubital tunnel syndrome   . Atrial flutter     A. s/p prior ablation;  B.  s/p CTI ablation 10/24/10 (Dr. Graciela Husbands)    Past Surgical History  Procedure Date  . Coronary angioplasty with stent placement 2005  . Ventral hernia repair   . Right olecranon nerve surgery     Current Outpatient Prescriptions  Medication Sig Dispense Refill  . aspirin 81 MG tablet Take 81 mg by mouth daily.        Marland Kitchen atenolol (TENORMIN) 50 MG tablet TAKE ONE TABLET BY MOUTH EVERY DAY  90 tablet  3  . glucosamine-chondroitin 500-400 MG tablet Take 1 tablet by mouth daily.        Marland Kitchen losartan-hydrochlorothiazide (HYZAAR) 50-12.5 MG per tablet TAKE ONE TABLET BY MOUTH EVERY DAY  30 tablet  11  . Multiple Vitamin (MULTIVITAMIN) capsule Take 1 capsule by mouth daily.        .  nitroGLYCERIN (NITROSTAT) 0.4 MG SL tablet Place 0.4 mg under the tongue every 5 (five) minutes as needed.        . NON FORMULARY CPAP Machine at night      . Omega-3 Fatty Acids (FISH OIL) 1200 MG CAPS Take 1 capsule by mouth daily.        Marland Kitchen oxyCODONE-acetaminophen (PERCOCET/ROXICET) 5-325 MG per tablet every 4 (four) hours as needed.       . SF 5000 PLUS 1.1 % CREA dental cream as needed.         No Known Allergies  Review of Systems negative except from HPI and PMH  Physical Exam BP 132/74  Pulse 67  Ht 5\' 11"  (1.803 m)  Wt 217 lb (98.431 kg)  BMI 30.27 kg/m2 Well developed and well nourished in no acute distress HENT normal E scleral and icterus clear Neck Supple JVP flat; carotids brisk and full Clear to ausculation  Regular rate and rhythm, no murmurs gallops or rub Soft with active bowel sounds No clubbing cyanosis none Edema Alert and oriented, grossly normal motor and sensory function Skin Warm and Dry Electrocardiogram \\t  demonstrates AV pacing   Assessment and  Plan

## 2012-07-04 NOTE — Patient Instructions (Signed)
Your physician wants you to follow-up in: 1 year with Dr. Graciela Husbands.  You will receive a reminder letter in the mail two months in advance. If you don't receive a letter, please call our office to schedule the follow-up appointment.  Remote monitoring is used to monitor your Pacemaker of ICD from home. This monitoring reduces the number of office visits required to check your device to one time per year. It allows [place] to keep an eye on the functioning of your device to ensure it is working properly. You are scheduled for a device check from home on October 08, 2012. You may send your transmission at any time that day. If you have a wireless device, the transmission will be sent automatically. After your physician reviews your transmission, you will receive a postcard with your next transmission date.

## 2012-07-04 NOTE — Assessment & Plan Note (Signed)
Maint NSR with A-pacing  No palpitations

## 2012-07-04 NOTE — Assessment & Plan Note (Signed)
F/U cancer center.  No evidence of myeloma.  Stable

## 2012-07-04 NOTE — Patient Instructions (Signed)
Your physician wants you to follow-up in:  6 MONTHS WITH DR NISHAN  You will receive a reminder letter in the mail two months in advance. If you don't receive a letter, please call our office to schedule the follow-up appointment. Your physician recommends that you continue on your current medications as directed. Please refer to the Current Medication list given to you today. 

## 2012-07-06 ENCOUNTER — Other Ambulatory Visit: Payer: Self-pay | Admitting: Cardiovascular Disease

## 2012-07-20 ENCOUNTER — Telehealth: Payer: Self-pay | Admitting: Pulmonary Disease

## 2012-07-20 NOTE — Telephone Encounter (Signed)
Called and spoke with patient. Advised patient that these on line companies are for supplies only. If you went with this company, you would not have any RT support if the cpap broke or was giving any problems. On line supplies are a little cheaper, however, you do not have the support from a RT. Pt stated that he was going to stay with [employer]. Advised patient that if he needed anything else to feel free to give [place] a call. Rhonda J Cobb

## 2012-08-28 ENCOUNTER — Other Ambulatory Visit: Payer: Self-pay | Admitting: Internal Medicine

## 2012-08-29 ENCOUNTER — Telehealth: Payer: Self-pay | Admitting: Cardiovascular Disease

## 2012-08-29 NOTE — Telephone Encounter (Signed)
Pt needs refill atenolol 50mg , uses [facility], said [facility] requested twice, and pt now out needs asap

## 2012-10-08 ENCOUNTER — Encounter: Payer: Medicare Other | Admitting: *Deleted

## 2012-10-09 ENCOUNTER — Encounter: Payer: Self-pay | Admitting: *Deleted

## 2012-10-11 ENCOUNTER — Encounter: Payer: Self-pay | Admitting: Internal Medicine

## 2012-10-11 ENCOUNTER — Ambulatory Visit (INDEPENDENT_AMBULATORY_CARE_PROVIDER_SITE_OTHER): Payer: Medicare Other | Admitting: *Deleted

## 2012-10-11 DIAGNOSIS — I442 Atrioventricular block, complete: Secondary | ICD-10-CM

## 2012-10-11 DIAGNOSIS — Z950 Presence of cardiac pacemaker: Secondary | ICD-10-CM

## 2012-10-12 LAB — REMOTE PACEMAKER DEVICE
ATRIAL PACING PM: 55
BAMS-0001: 150 {beats}/min
BAMS-0003: 70 {beats}/min
RV LEAD IMPEDENCE PM: 530 Ohm
RV LEAD THRESHOLD: 0.875 V
VENTRICULAR PACING PM: 100

## 2012-10-15 ENCOUNTER — Encounter: Payer: Self-pay | Admitting: Internal Medicine

## 2012-10-15 ENCOUNTER — Ambulatory Visit (INDEPENDENT_AMBULATORY_CARE_PROVIDER_SITE_OTHER): Payer: Medicare Other | Admitting: *Deleted

## 2012-10-15 DIAGNOSIS — I498 Other specified cardiac arrhythmias: Secondary | ICD-10-CM

## 2012-10-15 LAB — PACEMAKER DEVICE OBSERVATION
AL AMPLITUDE: 2.1 mv
AL THRESHOLD: 0.5 V
BAMS-0001: 150 {beats}/min
BAMS-0003: 70 {beats}/min
DEVICE MODEL PM: 7163077
RV LEAD IMPEDENCE PM: 525 Ohm
RV LEAD THRESHOLD: 0.875 V
VENTRICULAR PACING PM: 100

## 2012-10-15 NOTE — Progress Notes (Signed)
PPM interrogation and reprogramming

## 2012-10-16 ENCOUNTER — Telehealth: Payer: Self-pay | Admitting: Internal Medicine

## 2012-10-16 NOTE — Telephone Encounter (Signed)
I spoke with Dr. Kathlen Brunswick. Made him aware the patient's pacer was interrogated yesterday and reprogrammed. Nothing alarming noted that I could see. They will go ahead and pull one tooth today per Dr. Kathlen Brunswick.

## 2012-10-16 NOTE — Telephone Encounter (Signed)
Dr. Kathlen Brunswick has questions regarding pt pacer

## 2012-11-06 ENCOUNTER — Encounter: Payer: Self-pay | Admitting: *Deleted

## 2012-11-06 ENCOUNTER — Telehealth: Payer: Self-pay | Admitting: Oncology

## 2012-11-06 NOTE — Telephone Encounter (Signed)
Talked to pt and he is aware of new time on 3/17

## 2012-11-19 ENCOUNTER — Encounter: Payer: Self-pay | Admitting: Cardiovascular Disease

## 2012-11-19 ENCOUNTER — Other Ambulatory Visit: Payer: Medicare Other | Admitting: Lab

## 2012-11-19 ENCOUNTER — Ambulatory Visit: Payer: Medicare Other | Admitting: Oncology

## 2012-12-03 ENCOUNTER — Ambulatory Visit (HOSPITAL_BASED_OUTPATIENT_CLINIC_OR_DEPARTMENT_OTHER): Payer: Medicare Other | Admitting: Oncology

## 2012-12-03 ENCOUNTER — Other Ambulatory Visit (HOSPITAL_BASED_OUTPATIENT_CLINIC_OR_DEPARTMENT_OTHER): Payer: Medicare Other | Admitting: Lab

## 2012-12-03 ENCOUNTER — Telehealth: Payer: Self-pay | Admitting: Oncology

## 2012-12-03 ENCOUNTER — Encounter: Payer: Self-pay | Admitting: Oncology

## 2012-12-03 VITALS — BP 144/79 | HR 84 | Temp 97.9°F | Resp 20 | Ht 71.0 in | Wt 214.5 lb

## 2012-12-03 DIAGNOSIS — F17200 Nicotine dependence, unspecified, uncomplicated: Secondary | ICD-10-CM

## 2012-12-03 DIAGNOSIS — D472 Monoclonal gammopathy: Secondary | ICD-10-CM

## 2012-12-03 LAB — COMPREHENSIVE METABOLIC PANEL (CC13)
AST: 94 U/L — ABNORMAL HIGH (ref 5–34)
Albumin: 3.5 g/dL (ref 3.5–5.0)
Alkaline Phosphatase: 91 U/L (ref 40–150)
BUN: 8.8 mg/dL (ref 7.0–26.0)
Glucose: 118 mg/dl — ABNORMAL HIGH (ref 70–99)
Potassium: 4.2 mEq/L (ref 3.5–5.1)
Sodium: 133 mEq/L — ABNORMAL LOW (ref 136–145)
Total Bilirubin: 1.02 mg/dL (ref 0.20–1.20)
Total Protein: 8.4 g/dL — ABNORMAL HIGH (ref 6.4–8.3)

## 2012-12-03 LAB — CBC WITH DIFFERENTIAL/PLATELET
BASO%: 0.5 % (ref 0.0–2.0)
Basophils Absolute: 0 10*3/uL (ref 0.0–0.1)
EOS%: 2.8 % (ref 0.0–7.0)
MCH: 33.7 pg — ABNORMAL HIGH (ref 27.2–33.4)
MCHC: 35 g/dL (ref 32.0–36.0)
MCV: 96.1 fL (ref 79.3–98.0)
MONO%: 11.5 % (ref 0.0–14.0)
RBC: 4.13 10*6/uL — ABNORMAL LOW (ref 4.20–5.82)
RDW: 13.1 % (ref 11.0–14.6)
lymph#: 0.7 10*3/uL — ABNORMAL LOW (ref 0.9–3.3)

## 2012-12-03 NOTE — Patient Instructions (Signed)
Your liver tests, AST and ALT are elevated.  This may be due to alcohol (beer).  Consider decreasing your intake of beer.  You may want to discuss this with Dr. Vassie Loll.

## 2012-12-03 NOTE — Progress Notes (Signed)
CC:   [name], M.D. [name]. [name] Emms, MD, [facility]  PROBLEM LIST:  1. 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 In addition, the  patient had negative metastatic bone survey, negative urine  studies, specifically urine immunofixation electrophoresis, and CT  scans of chest, abdomen, and pelvis carried out on 03/12/2010 that  were negative. Bone marrow was carried out on 04/15/2010 and showed increased number of plasma cells by morphology and CD138 estimated to be 10%-15%.  There was lambda light chain restriction.   2. Permanent dual-chamber [employer] Jude pacemaker in the left chest, placed  on 07/02/2010.  3. History of atrial fibrillation, status post ablation procedure in  June 2010.  4. Coronary artery disease.  5. Dyslipidemia.  6. Hypertension.  7. Diverticulosis with history of gastrointestinal bleeding.  8. Benign prostatic hypertrophy.  9. Sleep apnea on CPAP.  10.Degenerative disk disease.  11.Degenerative arthritis involving the knees.  12.Elevation of AST and ALT, first noted in February 2012. The elevated transaminases are felt to be most likely secondary to the patient's daily beer consumption.   MEDICATIONS:  Reviewed and recorded. Current Outpatient Prescriptions  Medication Sig Dispense Refill  . aspirin 81 MG tablet Take 81 mg by mouth daily.        Marland Kitchen atenolol (TENORMIN) 50 MG tablet TAKE ONE TABLET BY MOUTH EVERY DAY  90 tablet  2  . glucosamine-chondroitin 500-400 MG tablet Take 1 tablet by mouth daily.        Marland Kitchen losartan (COZAAR) 50 MG tablet Take 100 mg by mouth daily.       . Multiple Vitamin (MULTIVITAMIN) capsule Take 1 capsule by mouth daily.        Marland Kitchen NITROSTAT 0.4 MG SL tablet DISSOLVE ONE TABLET UNDER THE TONGUE EVERY 5 MINUTES AS NEEDED FOR CHEST PAIN.  DO NOT EXCEED A TOTAL OF 3 DOSES IN 15 MINUTES  25 tablet  11  . NON  FORMULARY CPAP Machine at night      . Omega-3 Fatty Acids (FISH OIL) 1200 MG CAPS Take 1 capsule by mouth daily.        Marland Kitchen oxyCODONE-acetaminophen (PERCOCET/ROXICET) 5-325 MG per tablet every 4 (four) hours as needed.       . SF 5000 PLUS 1.1 % CREA dental cream as needed.        No current facility-administered medications for this visit.    SMOKING HISTORY:  The patient has smoked 1 pack of cigarettes a day off and on for about 50 years.  He last smoked about 7 years ago.   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  The patient was last seen by [place] on 05/21/2012 and prior to that on 11/03/2011.  He has been coming here since June of 2011.  His condition from the standpoint of his monoclonal gammopathy appears to be stable.  Recent values have actually decreased but still remain quite elevated.  His IgG level has actually decreased.  The patient has no major changes in his condition.  At one point recently apparently he was having low sodium related to hydrochlorothiazide.  Back in October of 2013 the patient apparently fell and fractured some right ribs and needed to stay in the hospital overnight at [facility] where he had been fishing.  The patient has some arthritis in his knees, otherwise denies any significant problems or changes in his condition.  PHYSICAL EXAMINATION:  He looks somewhat older than his stated age of 73.  Weight is 214 pounds 8 ounces.  Height 5 feet 11 inches.  Body surface area 2.21 sq m.  Blood pressure 144/79.  Other vital signs are normal.  There is no scleral icterus.  There is some sagging of his right upper lid.  He has bilateral hearing aids.  Mouth and pharynx are benign.  No peripheral adenopathy palpable in any of the lymph node areas.  Heart and lungs are normal.  Heart sounds were faint.  Rhythm was regular.  There is a  pacemaker in the left infraclavicular region. Abdomen obese but notable for a liver edge that can be felt about 6-7 cm below the right costal margin in the anterior axillary line.  He has a diastasis recti.  No obvious splenomegaly, masses or ascites. Extremities, puffy ankles without pitting.  Neurologic exam was normal.  LABORATORY DATA:  White count 5.8, ANC 4.2, hemoglobin 13.9, hematocrit 39.7, platelets 192,000.  Chemistries today notable for an AST of 94, ALT of 75.  Total protein was 8.4 and albumin 3.5 and thus globulins were 4.9.  IgG level was 370, IgA 101 and IgM level was 3480 as compared with 3760 on 11/03/2011, 4150 on 05/26/2011 and 4340 on 01/20/2011.  If we go back to 02/23/2010 the IgM level was 5060.  IMAGING STUDIES:  1. CT scan of chest, abdomen, and pelvis with IV contrast on  03/12/2010 showed possible focal fatty infiltration with no focal  hepatic lesions. There was no evidence for metastatic disease in  the abdomen or pelvis or evidence of multiple myeloma or lymphoma.  Chest CT was negative.  2. Metastatic bone survey from 03/19/2010 showed no evidence for  multiple myeloma.  3. Chest x-ray, 2 views, from 07/03/2010 showed minimal streaky  basilar atelectasis. Pacer wires were in good position without  complicating features.   IMPRESSION AND PLAN:  [name] continues to do fairly well with no evidence for progressive disease.  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  His IgA level is normal.  If anything his IgM level has decreased since we first started following the patient almost 3 years ago.  I called the patient's attention to his elevated liver function tests. I am concerned that this may be due to alcohol.  He consumes 3 beers on a daily basis.  We also reviewed his IgM levels.  I have asked [name] to return in 6 months, at  which time we will check CBC, chemistries and quantitative immunoglobulins.    ______________________________ Samul Dada, M.D. DSM/MEDQ  D:  12/03/2012  T:  12/03/2012  Job:  [id number]

## 2012-12-03 NOTE — Telephone Encounter (Signed)
gv and printed appt schedule for pt for Sept °

## 2012-12-03 NOTE — Progress Notes (Signed)
This office note has been dictated.  #[id number]

## 2012-12-04 LAB — IGG, IGA, IGM
IgA: 103 mg/dL (ref 68–379)
IgG (Immunoglobin G), Serum: 397 mg/dL — ABNORMAL LOW (ref 650–1600)

## 2013-01-07 ENCOUNTER — Encounter: Payer: Self-pay | Admitting: Internal Medicine

## 2013-01-07 ENCOUNTER — Ambulatory Visit (INDEPENDENT_AMBULATORY_CARE_PROVIDER_SITE_OTHER): Payer: Medicare Other | Admitting: *Deleted

## 2013-01-07 ENCOUNTER — Other Ambulatory Visit: Payer: Self-pay

## 2013-01-07 DIAGNOSIS — I442 Atrioventricular block, complete: Secondary | ICD-10-CM

## 2013-01-07 DIAGNOSIS — Z950 Presence of cardiac pacemaker: Secondary | ICD-10-CM

## 2013-01-08 LAB — REMOTE PACEMAKER DEVICE
AL THRESHOLD: 0.625 V
ATRIAL PACING PM: 65
BAMS-0001: 150 {beats}/min
BAMS-0003: 70 {beats}/min
RV LEAD THRESHOLD: 0.75 V
VENTRICULAR PACING PM: 100

## 2013-01-25 ENCOUNTER — Encounter: Payer: Self-pay | Admitting: *Deleted

## 2013-02-12 ENCOUNTER — Encounter: Payer: Self-pay | Admitting: Cardiovascular Disease

## 2013-02-12 ENCOUNTER — Ambulatory Visit (INDEPENDENT_AMBULATORY_CARE_PROVIDER_SITE_OTHER): Payer: Medicare Other | Admitting: Cardiovascular Disease

## 2013-02-12 VITALS — BP 128/80 | HR 61 | Wt 210.0 lb

## 2013-02-12 DIAGNOSIS — I495 Sick sinus syndrome: Secondary | ICD-10-CM

## 2013-02-12 DIAGNOSIS — I872 Venous insufficiency (chronic) (peripheral): Secondary | ICD-10-CM

## 2013-02-12 DIAGNOSIS — I10 Essential (primary) hypertension: Secondary | ICD-10-CM

## 2013-02-12 DIAGNOSIS — I2510 Atherosclerotic heart disease of native coronary artery without angina pectoris: Secondary | ICD-10-CM

## 2013-02-12 NOTE — Patient Instructions (Addendum)
Your physician wants you to follow-up in:   6 MONTHS WIT DR Eden Emms  You will receive a reminder letter in the mail two months in advance. If you don't receive a letter, please call our office to schedule the follow-up appointment. Your physician recommends that you continue on your current medications as directed. Please refer to the Current Medication list given to you today.

## 2013-02-12 NOTE — Progress Notes (Signed)
Patient ID: [name], male   DOB: [birth year], 72 y.o.   MRN: [id number] [name] is a 72 y.o. male Seen in followup for atrial flutter status post ablation. He also has a history of complete heart block status post pacemaker. He is without significant symptoms of chest pain shortness of breath or edema.  He has coronary artery disease - status post circumflex stent in the past.Cardiac catheterization in October 2011 demonstrated left anterior  descending 30% to 40% stenosis with other nonobstructive disease with medical therapy recommended.   Has Merlin machine to check pacer at home Hyponatremic on diuretic so stopped Still with urologic issues and impotence.   ROS: Denies fever, malais, weight loss, blurry vision, decreased visual acuity, cough, sputum, SOB, hemoptysis, pleuritic pain, palpitaitons, heartburn, abdominal pain, melena, lower extremity edema, claudication, or rash.  All other systems reviewed and negative  General: Affect appropriate Healthy:  appears stated age HEENT: normal Neck supple with no adenopathy JVP normal no bruits no thyromegaly Lungs clear with no wheezing and good diaphragmatic motion Heart:  S1/S2 no murmur, no rub, gallop or click PMI normal Abdomen: benighn, BS positve, no tenderness, no AAA no bruit.  No HSM or HJR Distal pulses intact with no bruits No edema Neuro non-focal Skin warm and dry No muscular weakness   Current Outpatient Prescriptions  Medication Sig Dispense Refill  . aspirin 81 MG tablet Take 81 mg by mouth daily.        Marland Kitchen atenolol (TENORMIN) 50 MG tablet TAKE ONE TABLET BY MOUTH EVERY DAY  90 tablet  2  . glucosamine-chondroitin 500-400 MG tablet Take 1 tablet by mouth daily.        Marland Kitchen losartan (COZAAR) 100 MG tablet Take 100 mg by mouth daily.      . Multiple Vitamin (MULTIVITAMIN) capsule Take 1 capsule by mouth daily.        Marland Kitchen NITROSTAT 0.4 MG SL tablet DISSOLVE ONE TABLET UNDER THE TONGUE EVERY 5 MINUTES AS NEEDED FOR  CHEST PAIN.  DO NOT EXCEED A TOTAL OF 3 DOSES IN 15 MINUTES  25 tablet  11  . NON FORMULARY CPAP Machine at night      . Omega-3 Fatty Acids (FISH OIL) 1200 MG CAPS Take 1 capsule by mouth daily.        . SF 5000 PLUS 1.1 % CREA dental cream as needed.        No current facility-administered medications for this visit.    Allergies  Review of patient's allergies indicates no known allergies.  Electrocardiogram:  10/13  A paced with long PR and LBBB  Assessment and Plan

## 2013-02-12 NOTE — Assessment & Plan Note (Signed)
Stable with no angina and good activity level.  Continue medical Rx  

## 2013-02-12 NOTE — Assessment & Plan Note (Signed)
Well controlled.  Continue current medications and low sodium Dash type diet.    

## 2013-02-12 NOTE — Assessment & Plan Note (Signed)
Stable trivial edema Low sodium diet diuretic stopped due to hyponatremia by Dr Virgel Manifold

## 2013-02-12 NOTE — Assessment & Plan Note (Signed)
Not pacer dependant Normal function continue telephone reports  F/U Dr Graciela Husbands

## 2013-04-04 ENCOUNTER — Ambulatory Visit (INDEPENDENT_AMBULATORY_CARE_PROVIDER_SITE_OTHER): Payer: Medicare Other | Admitting: *Deleted

## 2013-04-04 DIAGNOSIS — Z950 Presence of cardiac pacemaker: Secondary | ICD-10-CM

## 2013-04-04 DIAGNOSIS — I442 Atrioventricular block, complete: Secondary | ICD-10-CM

## 2013-04-08 ENCOUNTER — Encounter: Payer: Medicare Other | Admitting: *Deleted

## 2013-04-09 LAB — REMOTE PACEMAKER DEVICE
AL IMPEDENCE PM: 410 Ohm
AL THRESHOLD: 0.75 V
BATTERY VOLTAGE: 2.95 V
DEVICE MODEL PM: 7163077
RV LEAD IMPEDENCE PM: 480 Ohm
RV LEAD THRESHOLD: 0.74 V

## 2013-04-28 ENCOUNTER — Encounter (HOSPITAL_COMMUNITY): Payer: Self-pay | Admitting: Internal Medicine

## 2013-04-28 ENCOUNTER — Emergency Department (HOSPITAL_COMMUNITY): Payer: Medicare Other

## 2013-04-28 ENCOUNTER — Observation Stay (HOSPITAL_COMMUNITY)
Admission: EM | Admit: 2013-04-28 | Discharge: 2013-04-29 | Disposition: A | Discharge status: Home / Self Care | Payer: Medicare Other | Attending: Internal Medicine | Admitting: Internal Medicine

## 2013-04-28 DIAGNOSIS — I471 Supraventricular tachycardia: Secondary | ICD-10-CM

## 2013-04-28 DIAGNOSIS — M436 Torticollis: Secondary | ICD-10-CM | POA: Insufficient documentation

## 2013-04-28 DIAGNOSIS — F10929 Alcohol use, unspecified with intoxication, unspecified: Secondary | ICD-10-CM

## 2013-04-28 DIAGNOSIS — M5030 Other cervical disc degeneration, unspecified cervical region: Secondary | ICD-10-CM | POA: Insufficient documentation

## 2013-04-28 DIAGNOSIS — I442 Atrioventricular block, complete: Secondary | ICD-10-CM | POA: Diagnosis present

## 2013-04-28 DIAGNOSIS — I639 Cerebral infarction, unspecified: Secondary | ICD-10-CM

## 2013-04-28 DIAGNOSIS — F1010 Alcohol abuse, uncomplicated: Secondary | ICD-10-CM

## 2013-04-28 DIAGNOSIS — F10129 Alcohol abuse with intoxication, unspecified: Secondary | ICD-10-CM | POA: Diagnosis present

## 2013-04-28 DIAGNOSIS — D472 Monoclonal gammopathy: Secondary | ICD-10-CM

## 2013-04-28 DIAGNOSIS — R4789 Other speech disturbances: Secondary | ICD-10-CM | POA: Insufficient documentation

## 2013-04-28 DIAGNOSIS — I4430 Unspecified atrioventricular block: Secondary | ICD-10-CM | POA: Insufficient documentation

## 2013-04-28 DIAGNOSIS — C9000 Multiple myeloma not having achieved remission: Secondary | ICD-10-CM | POA: Diagnosis present

## 2013-04-28 DIAGNOSIS — G4733 Obstructive sleep apnea (adult) (pediatric): Secondary | ICD-10-CM

## 2013-04-28 DIAGNOSIS — I495 Sick sinus syndrome: Secondary | ICD-10-CM

## 2013-04-28 DIAGNOSIS — F10229 Alcohol dependence with intoxication, unspecified: Principal | ICD-10-CM | POA: Insufficient documentation

## 2013-04-28 DIAGNOSIS — T7840XA Allergy, unspecified, initial encounter: Secondary | ICD-10-CM

## 2013-04-28 DIAGNOSIS — I2510 Atherosclerotic heart disease of native coronary artery without angina pectoris: Secondary | ICD-10-CM

## 2013-04-28 DIAGNOSIS — M47812 Spondylosis without myelopathy or radiculopathy, cervical region: Secondary | ICD-10-CM | POA: Insufficient documentation

## 2013-04-28 DIAGNOSIS — I480 Paroxysmal atrial fibrillation: Secondary | ICD-10-CM | POA: Diagnosis present

## 2013-04-28 DIAGNOSIS — G319 Degenerative disease of nervous system, unspecified: Secondary | ICD-10-CM | POA: Insufficient documentation

## 2013-04-28 DIAGNOSIS — Z950 Presence of cardiac pacemaker: Secondary | ICD-10-CM | POA: Diagnosis present

## 2013-04-28 DIAGNOSIS — R471 Dysarthria and anarthria: Secondary | ICD-10-CM

## 2013-04-28 DIAGNOSIS — I6529 Occlusion and stenosis of unspecified carotid artery: Secondary | ICD-10-CM | POA: Insufficient documentation

## 2013-04-28 DIAGNOSIS — F17200 Nicotine dependence, unspecified, uncomplicated: Secondary | ICD-10-CM | POA: Diagnosis present

## 2013-04-28 DIAGNOSIS — I10 Essential (primary) hypertension: Secondary | ICD-10-CM | POA: Diagnosis present

## 2013-04-28 DIAGNOSIS — I872 Venous insufficiency (chronic) (peripheral): Secondary | ICD-10-CM

## 2013-04-28 DIAGNOSIS — I6789 Other cerebrovascular disease: Secondary | ICD-10-CM | POA: Insufficient documentation

## 2013-04-28 DIAGNOSIS — R0602 Shortness of breath: Secondary | ICD-10-CM

## 2013-04-28 DIAGNOSIS — M6281 Muscle weakness (generalized): Secondary | ICD-10-CM | POA: Diagnosis present

## 2013-04-28 LAB — COMPREHENSIVE METABOLIC PANEL
Albumin: 3.6 g/dL (ref 3.5–5.2)
BUN: 6 mg/dL (ref 6–23)
CO2: 25 mEq/L (ref 19–32)
Chloride: 88 mEq/L — ABNORMAL LOW (ref 96–112)
Creatinine, Ser: 0.56 mg/dL (ref 0.50–1.35)
GFR calc Af Amer: >90 mL/min (ref >90)
GFR calc non Af Amer: >90 mL/min (ref >90)
Glucose, Bld: 97 mg/dL (ref 70–99)
Total Bilirubin: 0.6 mg/dL (ref 0.3–1.2)

## 2013-04-28 LAB — POCT I-STAT, CHEM 8
BUN: 4 mg/dL — ABNORMAL LOW (ref 6–23)
Calcium, Ion: 1.11 mmol/L — ABNORMAL LOW (ref 1.13–1.30)
Hemoglobin: 13.3 g/dL (ref 13.0–17.0)
Sodium: 130 mEq/L — ABNORMAL LOW (ref 135–145)
TCO2: 23 mmol/L (ref 0–100)

## 2013-04-28 LAB — POCT I-STAT TROPONIN I: Troponin i, poc: 0 ng/mL (ref 0.00–0.08)

## 2013-04-28 LAB — URINALYSIS, ROUTINE W REFLEX MICROSCOPIC
Bilirubin Urine: NEGATIVE
Ketones, ur: NEGATIVE mg/dL
Nitrite: NEGATIVE
Protein, ur: NEGATIVE mg/dL
Specific Gravity, Urine: 1.006 (ref 1.005–1.030)
Urobilinogen, UA: 0.2 mg/dL (ref 0.0–1.0)

## 2013-04-28 LAB — DIFFERENTIAL
Basophils Relative: 1 % (ref 0–1)
Lymphs Abs: 1.5 10*3/uL (ref 0.7–4.0)
Monocytes Absolute: 0.6 10*3/uL (ref 0.1–1.0)
Monocytes Relative: 9 % (ref 3–12)
Neutro Abs: 5 10*3/uL (ref 1.7–7.7)

## 2013-04-28 LAB — TROPONIN I: Troponin I: <0.3 ng/mL (ref <0.30)

## 2013-04-28 LAB — CBC
HCT: 34.9 % — ABNORMAL LOW (ref 39.0–52.0)
Hemoglobin: 13.2 g/dL (ref 13.0–17.0)
MCH: 35 pg — ABNORMAL HIGH (ref 26.0–34.0)
MCHC: 37.8 g/dL — ABNORMAL HIGH (ref 30.0–36.0)

## 2013-04-28 LAB — APTT: aPTT: 36 seconds (ref 24–37)

## 2013-04-28 LAB — RAPID URINE DRUG SCREEN, HOSP PERFORMED: Opiates: NOT DETECTED

## 2013-04-28 LAB — CK TOTAL AND CKMB (NOT AT ARMC): Relative Index: 2.8 — ABNORMAL HIGH (ref 0.0–2.5)

## 2013-04-28 LAB — GLUCOSE, CAPILLARY: Glucose-Capillary: 87 mg/dL (ref 70–99)

## 2013-04-28 MED ORDER — DEXTROSE-NACL 5-0.45 % IV SOLN
INTRAVENOUS | Status: AC
  Administered 2013-04-29: via INTRAVENOUS

## 2013-04-28 MED ORDER — VITAMIN B-1 100 MG PO TABS
100.0000 mg | ORAL_TABLET | Freq: Every day | ORAL | Status: DC
  Administered 2013-04-29: 100 mg via ORAL
  Filled 2013-04-28: qty 1

## 2013-04-28 MED ORDER — ASPIRIN 81 MG PO CHEW
81.0000 mg | CHEWABLE_TABLET | Freq: Every day | ORAL | Status: DC
  Administered 2013-04-29: 81 mg via ORAL
  Filled 2013-04-28: qty 1

## 2013-04-28 MED ORDER — SODIUM CHLORIDE 0.9 % IJ SOLN: 3.0000 mL | Freq: Two times a day (BID) | INTRAMUSCULAR | Status: DC

## 2013-04-28 MED ORDER — OMEGA-3-ACID ETHYL ESTERS 1 G PO CAPS
1.0000 g | ORAL_CAPSULE | Freq: Every day | ORAL | Status: DC
  Administered 2013-04-29: 1 g via ORAL
  Filled 2013-04-28: qty 1

## 2013-04-28 MED ORDER — THIAMINE HCL 100 MG/ML IJ SOLN
100.0000 mg | Freq: Every day | INTRAMUSCULAR | Status: DC
  Filled 2013-04-28: qty 1

## 2013-04-28 MED ORDER — LOSARTAN POTASSIUM 50 MG PO TABS
100.0000 mg | ORAL_TABLET | Freq: Every day | ORAL | Status: DC
  Administered 2013-04-29: 100 mg via ORAL
  Filled 2013-04-28: qty 2

## 2013-04-28 MED ORDER — FOLIC ACID 1 MG PO TABS
1.0000 mg | ORAL_TABLET | Freq: Every day | ORAL | Status: DC
  Administered 2013-04-29: 1 mg via ORAL
  Filled 2013-04-28: qty 1

## 2013-04-28 MED ORDER — DEXTROSE-NACL 5-0.9 % IV SOLN: INTRAVENOUS | Status: DC

## 2013-04-28 MED ORDER — LORAZEPAM 2 MG/ML IJ SOLN: 0.0000 mg | Freq: Two times a day (BID) | INTRAMUSCULAR | Status: DC

## 2013-04-28 MED ORDER — LORAZEPAM 2 MG/ML IJ SOLN
0.0000 mg | Freq: Four times a day (QID) | INTRAMUSCULAR | Status: DC
  Administered 2013-04-29 (×2): 2 mg via INTRAVENOUS
  Filled 2013-04-28: qty 1

## 2013-04-28 MED ORDER — HEPARIN SODIUM (PORCINE) 5000 UNIT/ML IJ SOLN
5000.0000 [IU] | Freq: Three times a day (TID) | INTRAMUSCULAR | Status: DC
  Administered 2013-04-29: 5000 [IU] via SUBCUTANEOUS
  Filled 2013-04-28 (×4): qty 1

## 2013-04-28 MED ORDER — ATENOLOL 50 MG PO TABS
50.0000 mg | ORAL_TABLET | Freq: Every day | ORAL | Status: DC
  Administered 2013-04-29: 50 mg via ORAL
  Filled 2013-04-28: qty 1

## 2013-04-28 MED ORDER — ADULT MULTIVITAMIN W/MINERALS CH
1.0000 | ORAL_TABLET | Freq: Every day | ORAL | Status: DC
  Administered 2013-04-29: 1 via ORAL
  Filled 2013-04-28: qty 1

## 2013-04-28 MED ORDER — LORAZEPAM 1 MG PO TABS: 1.0000 mg | ORAL_TABLET | Freq: Four times a day (QID) | ORAL | Status: DC | PRN

## 2013-04-28 MED ORDER — LORAZEPAM 2 MG/ML IJ SOLN
1.0000 mg | Freq: Four times a day (QID) | INTRAMUSCULAR | Status: DC | PRN
  Filled 2013-04-28: qty 1

## 2013-04-28 MED ORDER — GLUCOSAMINE-CHONDROITIN 500-400 MG PO TABS: 1.0000 | ORAL_TABLET | Freq: Every day | ORAL | Status: DC

## 2013-04-28 NOTE — ED Notes (Signed)
Pts CBG was 87 reported to nurse Brittney.

## 2013-04-28 NOTE — ED Notes (Signed)
Per wife pt starts drinking beer at appx 7 am and has "more than 24" /day. Pt also drinks wine, but wife unsure of the amt. Per wife, pt drinks every day and "I don't know when the last day he didn't have a drink." MD made aware.

## 2013-04-28 NOTE — ED Notes (Signed)
Per EMS: Pt fell in garden at appx 1500 today. Pt found by neighbors "several hours later." Pt reports that he was too weak to get up himself. On arrival, pt found with slurred speech. Per family, speech was different than normal. Pt reports drinking "maybe 5 beers and some wine with my lunch." AO x4. PERRLA. Grips equal.

## 2013-04-28 NOTE — ED Notes (Signed)
CRITICAL VALUE ALERT  Critical value received: CK, MB 14.6   Date of notification:  04/28/2013  Time of notification:  1956  Critical value read back: Yes  Nurse who received alert:  Christin Bach   MD notified (1st page):  Md Fayrene Fearing

## 2013-04-28 NOTE — ED Provider Notes (Signed)
CSN: [id number]     Arrival date & time 04/28/13  1827 History     First MD Initiated Contact with Patient 04/28/13 1828     Chief Complaint  Patient presents with  . Code Stroke    HPI   Patient presents as a "code stroke". He was out in his garden today he remembers being at the. Ultimately does reveal that he was drinking at 90. He was  in his garden and fell, he has some abrasions to his face. He was not able to get himself inside. He stated that he had some weakness of his right arm and leg and is having trouble using them.  Family or neighbor came and found him and called 911. They noticed some slurring of speech and right upper Motrin weakness and right facial drooping he was transferred here he has a history of a pacemaker for heart block hypertension multiple myeloma chronic back pain degenerative disc disease he is not anticoagulated outside of an  aspirin 81 mg per day. Past Medical History  Diagnosis Date  . CAD (coronary artery disease)     A.  s/p CFX in the past;   B.  cath 06/2010: LAD 30-40%, D1 50%, OM1 50%, AVCFX stent 30%, dCFX 70-80% (med Rx), mRCA 40%;      C.  Echo 06/2010: EF 60-65%, mod LVH; mild LAE     . Heart block   . 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 Hypertension   . Multiple myeloma   . Sleep apnea   . Degenerative disc disease   . Venous insufficiency   . Cubital tunnel syndrome   . Atrial flutter     A. s/p prior ablation;  B.  s/p CTI ablation 10/24/10 (Dr. Graciela Husbands)   Past Surgical History  Procedure Laterality Date  . Coronary angioplasty with stent placement  2005  . Ventral hernia repair    . Right olecranon nerve surgery     Family History  Problem Relation Age of Onset  . Heart disease Father   . Cancer Father     prostate  . Heart disease Brother    History  Substance Use Topics  . Smoking status: Former Smoker -- 1.00 packs/day for 49 years    Types: Cigarettes    Quit date: 09/12/2004  . Smokeless tobacco:  Former Neurosurgeon     Comment: started at age 67; smoked 1 ppd; quit in 2006  . Alcohol Use: 14.0 oz/week    28 drink(s) per week     Comment: occasional    Review of Systems  Constitutional: Negative for fever, chills, diaphoresis, appetite change and fatigue.  HENT: Negative for sore throat, mouth sores and trouble swallowing.   Eyes: Negative for visual disturbance.  Respiratory: Negative for cough, chest tightness, shortness of breath and wheezing.   Cardiovascular: Negative for chest pain.  Gastrointestinal: Negative for nausea, vomiting, abdominal pain, diarrhea and abdominal distention.  Endocrine: Negative for polydipsia, polyphagia and polyuria.  Genitourinary: Negative for dysuria, frequency and hematuria.  Musculoskeletal: Negative for gait problem.  Skin: Negative for color change, pallor and rash.  Neurological: Positive for dizziness, weakness and headaches. Negative for syncope and light-headedness.  Hematological: Does not bruise/bleed easily.  Psychiatric/Behavioral: Negative for behavioral problems and confusion.    Allergies  Review of patient's allergies indicates no known allergies.  Home Medications   Current Outpatient Rx  Name  Route  Sig  Dispense  Refill  . aspirin 81  MG tablet   Oral   Take 81 mg by mouth daily.           Marland Kitchen atenolol (TENORMIN) 50 MG tablet      TAKE ONE TABLET BY MOUTH EVERY DAY   90 tablet   2   . glucosamine-chondroitin 500-400 MG tablet   Oral   Take 1 tablet by mouth daily.           Marland Kitchen losartan (COZAAR) 100 MG tablet   Oral   Take 100 mg by mouth daily.         . Multiple Vitamin (MULTIVITAMIN) capsule   Oral   Take 1 capsule by mouth daily.           Marland Kitchen NITROSTAT 0.4 MG SL tablet      DISSOLVE ONE TABLET UNDER THE TONGUE EVERY 5 MINUTES AS NEEDED FOR CHEST PAIN.  DO NOT EXCEED A TOTAL OF 3 DOSES IN 15 MINUTES   25 tablet   11   . NON FORMULARY      CPAP Machine at night         . Omega-3 Fatty Acids  (FISH OIL) 1200 MG CAPS   Oral   Take 1 capsule by mouth daily.           . SF 5000 PLUS 1.1 % CREA dental cream      as needed.           BP 136/67  Pulse 72  Temp(Src) 97.7 F (36.5 C) (Oral)  Resp 15  SpO2 97% Physical Exam  Constitutional: He is oriented to person, place, and time. He appears well-developed and well-nourished. No distress.  He is awake. He is calm.  HENT:  Head: Normocephalic.  He has abrasions to the right temporal parietal scalp  Eyes: Conjunctivae are normal. Pupils are equal, round, and reactive to light. No scleral icterus.  Neck: Normal range of motion. Neck supple. No thyromegaly present.  Complains of chronic neck pain has no midline spinal tenderness  Cardiovascular: Normal rate and regular rhythm.  Exam reveals no gallop and no friction rub.   No murmur heard. Heart tones regular. The patient on the monitor  Pulmonary/Chest: Effort normal and breath sounds normal. No respiratory distress. He has no wheezes. He has no rales.  Abdominal: Soft. Bowel sounds are normal. He exhibits no distension. There is no tenderness. There is no rebound.  Musculoskeletal: Normal range of motion.  Neurological: He is alert and oriented to person, place, and time.  Neuro. Finger to nose cough. He does have some right lower facial drooping. Her speech is slurred. He has symmetric strength to the right upper and lower chin is intact and symmetric bilaterally  Skin: Skin is warm and dry. No rash noted.  Psychiatric: He has a normal mood and affect. His behavior is normal.    ED Course   Procedures (including critical care time)  Labs Reviewed  ETHANOL - Abnormal; Notable for the following:    Alcohol, Ethyl (B) 255 (*)    All other components within normal limits  CBC - Abnormal; Notable for the following:    RBC 3.77 (*)    HCT 34.9 (*)    MCH 35.0 (*)    MCHC 37.8 (*)    All other components within normal limits  COMPREHENSIVE METABOLIC PANEL -  Abnormal; Notable for the following:    Sodium 125 (*)    Chloride 88 (*)    AST 74 (*)  All other components within normal limits  CK TOTAL AND CKMB - Abnormal; Notable for the following:    Total CK 522 (*)    CK, MB 14.6 (*)    Relative Index 2.8 (*)    All other components within normal limits  POCT I-STAT, CHEM 8 - Abnormal; Notable for the following:    Sodium 130 (*)    Chloride 94 (*)    BUN 4 (*)    Glucose, Bld 100 (*)    Calcium, Ion 1.11 (*)    All other components within normal limits  PROTIME-INR  APTT  DIFFERENTIAL  TROPONIN I  URINE RAPID DRUG SCREEN (HOSP PERFORMED)  URINALYSIS, ROUTINE W REFLEX MICROSCOPIC  GLUCOSE, CAPILLARY  POCT I-STAT TROPONIN I   Ct Head Wo Contrast  04/28/2013   *RADIOLOGY REPORT*  Clinical Data:  Fall.  Head trauma.  History multiple myeloma.  CT HEAD WITHOUT CONTRAST CT CERVICAL SPINE WITHOUT CONTRAST  Technique:  Multidetector CT imaging of the head and cervical spine was performed following the standard protocol without intravenous contrast.  Multiplanar CT image reconstructions of the cervical spine were also generated.  Comparison:  MRI 04/02/2008 of the cervical spine.  CT HEAD  Findings: No mass lesion, mass effect, midline shift, hydrocephalus, hemorrhage.  No acute territorial cortical ischemia/infarct. Atrophy and chronic ischemic white matter disease is present.  There is no skull fracture.  Presumed hematoma is present in the left parietal scalp near the vertex.  Small lacunar infarct in the right sub insular region.  This appears chronic. Intracranial atherosclerosis is present.  Ethmoid sinus mucosal thickening is present with similar changes in the right maxillary sinus.  Small mucous retention cyst or polyp in the right sphenoid sinus.  The mastoid air cells appear clear.  There is no skull fracture.  IMPRESSION: Atrophy and chronic ischemic white matter disease without acute intracranial abnormality.  Right sub insular lacunar  infarct is probably chronic.  Probable left parietal scalp hematoma.  CT CERVICAL SPINE  Findings: Dextroconvex torticollis is present.  There is no dislocation of the cervical spine.  Multilevel degenerative disease is present involving both discs and facets.  Degenerative disc disease is most pronounced at C5-V6 and C6-C7.  Facet arthropathy is generally greater on the left than right and could produce foraminal encroachment at multiple levels.  Carotid atherosclerosis is present.  Lung apices are within normal limits.  Paranasal sinus disease partially visualized.  IMPRESSION: No acute abnormality.  Multilevel cervical spondylosis and facet arthrosis.   Original Report Authenticated By: Andreas Newport, M.D.   Ct Cervical Spine Wo Contrast  04/28/2013   *RADIOLOGY REPORT*  Clinical Data:  Fall.  Head trauma.  History multiple myeloma.  CT HEAD WITHOUT CONTRAST CT CERVICAL SPINE WITHOUT CONTRAST  Technique:  Multidetector CT imaging of the head and cervical spine was performed following the standard protocol without intravenous contrast.  Multiplanar CT image reconstructions of the cervical spine were also generated.  Comparison:  MRI 04/02/2008 of the cervical spine.  CT HEAD  Findings: No mass lesion, mass effect, midline shift, hydrocephalus, hemorrhage.  No acute territorial cortical ischemia/infarct. Atrophy and chronic ischemic white matter disease is present.  There is no skull fracture.  Presumed hematoma is present in the left parietal scalp near the vertex.  Small lacunar infarct in the right sub insular region.  This appears chronic. Intracranial atherosclerosis is present.  Ethmoid sinus mucosal thickening is present with similar changes in the right maxillary sinus.  Small mucous  retention cyst or polyp in the right sphenoid sinus.  The mastoid air cells appear clear.  There is no skull fracture.  IMPRESSION: Atrophy and chronic ischemic white matter disease without acute intracranial abnormality.   Right sub insular lacunar infarct is probably chronic.  Probable left parietal scalp hematoma.  CT CERVICAL SPINE  Findings: Dextroconvex torticollis is present.  There is no dislocation of the cervical spine.  Multilevel degenerative disease is present involving both discs and facets.  Degenerative disc disease is most pronounced at C5-V6 and C6-C7.  Facet arthropathy is generally greater on the left than right and could produce foraminal encroachment at multiple levels.  Carotid atherosclerosis is present.  Lung apices are within normal limits.  Paranasal sinus disease partially visualized.  IMPRESSION: No acute abnormality.  Multilevel cervical spondylosis and facet arthrosis.   Original Report Authenticated By: Andreas Newport, M.D.   No diagnosis found.  MDM  Patient was seen and evaluated upon arrival and upon going to CT scan by myself as well as Dr. Cyril Mourning  of neurology. CT scan shows no acute traumatic abnormalities hematoma shows old lacunar infarct. No acute infarct. Discussed the patient patient with Dr. patient cannot an MRI because his pacemaker. He felt that observation and reevaluation for resolution of symptoms would be indicated. Patient's alcohol level is elevated at 255. I have placed a call to hospitalist regarding admission with the above  Claudean Kinds, MD 04/30/13 [phone]

## 2013-04-28 NOTE — Code Documentation (Signed)
72 year old male presented to [facility] via [facility] as code stroke.  Called in field at 1819.  Arrived to ED at 1827.  EDP exam am 1828.  Neuro team here at 1829.  On arrival patient alert - oriented - following commands.  He states he went to his garden this afternoon around 1200 and fell - he denies dizziness, SOB or passing out - states he just fell because he has bad knees.  He was found around 1430 by a friend who was worried he was having heat stroke - and doused him with cool water.  He noticed slurred speech. Patient and family endorse he was drinking beer today- patient states he drinks everyday and did not know how many he had today- but stated "not that many."  Wife and daughter report they saw him at 55 today - he was at his baseline - they went shopping and were unaware how much he drank this morning.  Wife did state that he develops slurred speech when drinking.  NIHSS 3 - slight right facial droop, right sensation loss and slurred speech.  His BP on arrival was 102/43 - NS 500 cc bolus started - BP 125/67.  HR 85.  Speech somewhat better with increased BP. Patient has pacemaker - unable to do MRI.    ETOH odor on breath.  LSW 1100 - outside tpa window.  Dr. Cyril Mourning present - spoke with family.  Handoff to [place] RN - neuro checks q 2 hours per protocol.

## 2013-04-28 NOTE — ED Notes (Signed)
Admitting MD at bedside.

## 2013-04-28 NOTE — Consult Note (Addendum)
Referring Physician: ED    Chief Complaint: CODE STROKE: DYSARTHRIA, FALL.  HPI:                                                                                                                                         [name] is an 72 y.o. male with a past medical history significant for HTN, CAD s/p stenting, atrial flutter s/p ablation, s/p pacemaker placement, ETOH abuse, brought to [facility] ED by medics as a code stroke after being found at his garden on the floor with slurred speech. He was probably last seen normal around 11 am today and then found in the floor in his garden by a friend few hours later. Family reports that he drinks alcohol heavily on a daily basis and most likely was drinking today when they left the house. He indicated that he was too weak to get up by himself and family said that his speech gets slurred when he drinks. No reported headache, vertigo, double vision, focal weakness or numbness, confusion, or vision disturbances. Upon arrival to ED NIHSS 3 but concern that the dysarthria could be alcohol induced. CT brain showed no acute abnormality.  Date last known well: 04/28/13  Time last known well: 11 am tPA Given: no, alte presentation NIHSS: 3 MRS: 0  Past Medical History  Diagnosis Date  . CAD (coronary artery disease)     A.  s/p CFX in the past;   B.  cath 06/2010: LAD 30-40%, D1 50%, OM1 50%, AVCFX stent 30%, dCFX 70-80% (med Rx), mRCA 40%;      C.  Echo 06/2010: EF 60-65%, mod LVH; mild LAE     . Heart block   . 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 Hypertension   . Multiple myeloma   . Sleep apnea   . Degenerative disc disease   . Venous insufficiency   . Cubital tunnel syndrome   . Atrial flutter     A. s/p prior ablation;  B.  s/p CTI ablation 10/24/10 (Dr. Graciela Husbands)    Past Surgical History  Procedure Laterality Date  . Coronary angioplasty with stent placement  2005  . Ventral hernia repair    . Right olecranon nerve  surgery      Family History  Problem Relation Age of Onset  . Heart disease Father   . Cancer Father     prostate  . Heart disease Brother    Social History:  reports that he quit smoking about 8 years ago. His smoking use included Cigarettes. He has a 49 pack-year smoking history. He has quit using smokeless tobacco. He reports that he drinks about 14.0 ounces of alcohol per week. His drug history is not on file.  Allergies: No Known Allergies  Medications:  I have reviewed the patient's current medications.  ROS:                                                                                                                                       History obtained from the patient, family, and chart review.  General ROS: negative for - chills, fatigue, fever, night sweats, weight gain or weight loss Psychological ROS: negative for - behavioral disorder, hallucinations, memory difficulties, mood swings or suicidal ideation Ophthalmic ROS: negative for - blurry vision, double vision, eye pain or loss of vision ENT ROS: negative for - epistaxis, nasal discharge, oral lesions, sore throat, tinnitus or vertigo Allergy and Immunology ROS: negative for - hives or itchy/watery eyes Hematological and Lymphatic ROS: negative for - bleeding problems, bruising or swollen lymph nodes Endocrine ROS: negative for - galactorrhea, hair pattern changes, polydipsia/polyuria or temperature intolerance Respiratory ROS: negative for - cough, hemoptysis, shortness of breath or wheezing Cardiovascular ROS: negative for - chest pain, dyspnea on exertion, edema or irregular heartbeat Gastrointestinal ROS: negative for - abdominal pain, diarrhea, hematemesis, nausea/vomiting or stool incontinence Genito-Urinary ROS: negative for - dysuria, hematuria, incontinence or urinary  frequency/urgency Musculoskeletal ROS: negative for - joint swelling or muscular weakness Neurological ROS: as noted in HPI Dermatological ROS: negative for rash and skin lesion changes    Physical exam: pleasant male in no apparent distress. BP 107/70 P 82 R 17, afebrile Head: normocephalic. Neck: supple, no bruits, no JVD. Cardiac: no murmurs. Lungs: clear. Abdomen: soft, no tender, no mass. Extremities: no edema.    Neurologic Examination:                                                                                                      Mental Status: Alert, oriented, thought content appropriate.  No aphasia but mild dysarthria.  Able to follow 3 step commands without difficulty. Cranial Nerves: II: Discs flat bilaterally; Visual [place] grossly normal, pupils equal, round, reactive to light and accommodation III,IV, VI: ptosis not present, extra-ocular motions intact bilaterally V:  facial light touch sensation normal diminished in the left VII: mild right face droop. VIII: hearing normal bilaterally IX,X: gag reflex present XI: bilateral shoulder shrug XII: midline tongue extension Motor: Right : Upper extremity   5/5    Left:     Upper extremity   5/5  Lower extremity   5/5     Lower extremity   5/5 Tone and bulk:normal tone throughout; no atrophy noted Sensory: Pinprick  and light touch intact throughout, bilaterally Deep Tendon Reflexes:  1+ all over Plantars: Right: downgoing   Left: downgoing Cerebellar: normal finger-to-nose,  normal heel-to-shin test Gait:  No ataxia. CV: pulses palpable throughout    Results for orders placed during the hospital encounter of 04/28/13 (from the past 48 hour(s))  GLUCOSE, CAPILLARY     Status: None   Collection Time    04/28/13  7:07 PM      Result Value Range   Glucose-Capillary 87  70 - 99 mg/dL   Ct Head Wo Contrast  04/28/2013   *RADIOLOGY REPORT*  Clinical Data:  Fall.  Head trauma.  History multiple myeloma.  CT  HEAD WITHOUT CONTRAST CT CERVICAL SPINE WITHOUT CONTRAST  Technique:  Multidetector CT imaging of the head and cervical spine was performed following the standard protocol without intravenous contrast.  Multiplanar CT image reconstructions of the cervical spine were also generated.  Comparison:  MRI 04/02/2008 of the cervical spine.  CT HEAD  Findings: No mass lesion, mass effect, midline shift, hydrocephalus, hemorrhage.  No acute territorial cortical ischemia/infarct. Atrophy and chronic ischemic white matter disease is present.  There is no skull fracture.  Presumed hematoma is present in the left parietal scalp near the vertex.  Small lacunar infarct in the right sub insular region.  This appears chronic. Intracranial atherosclerosis is present.  Ethmoid sinus mucosal thickening is present with similar changes in the right maxillary sinus.  Small mucous retention cyst or polyp in the right sphenoid sinus.  The mastoid air cells appear clear.  There is no skull fracture.  IMPRESSION: Atrophy and chronic ischemic white matter disease without acute intracranial abnormality.  Right sub insular lacunar infarct is probably chronic.  Probable left parietal scalp hematoma.  CT CERVICAL SPINE  Findings: Dextroconvex torticollis is present.  There is no dislocation of the cervical spine.  Multilevel degenerative disease is present involving both discs and facets.  Degenerative disc disease is most pronounced at C5-V6 and C6-C7.  Facet arthropathy is generally greater on the left than right and could produce foraminal encroachment at multiple levels.  Carotid atherosclerosis is present.  Lung apices are within normal limits.  Paranasal sinus disease partially visualized.  IMPRESSION: No acute abnormality.  Multilevel cervical spondylosis and facet arthrosis.   Original Report Authenticated By: Andreas Newport, M.D.   Ct Cervical Spine Wo Contrast  04/28/2013   *RADIOLOGY REPORT*  Clinical Data:  Fall.  Head trauma.   History multiple myeloma.  CT HEAD WITHOUT CONTRAST CT CERVICAL SPINE WITHOUT CONTRAST  Technique:  Multidetector CT imaging of the head and cervical spine was performed following the standard protocol without intravenous contrast.  Multiplanar CT image reconstructions of the cervical spine were also generated.  Comparison:  MRI 04/02/2008 of the cervical spine.  CT HEAD  Findings: No mass lesion, mass effect, midline shift, hydrocephalus, hemorrhage.  No acute territorial cortical ischemia/infarct. Atrophy and chronic ischemic white matter disease is present.  There is no skull fracture.  Presumed hematoma is present in the left parietal scalp near the vertex.  Small lacunar infarct in the right sub insular region.  This appears chronic. Intracranial atherosclerosis is present.  Ethmoid sinus mucosal thickening is present with similar changes in the right maxillary sinus.  Small mucous retention cyst or polyp in the right sphenoid sinus.  The mastoid air cells appear clear.  There is no skull fracture.  IMPRESSION: Atrophy and chronic ischemic white matter disease without acute intracranial abnormality.  Right sub  insular lacunar infarct is probably chronic.  Probable left parietal scalp hematoma.  CT CERVICAL SPINE  Findings: Dextroconvex torticollis is present.  There is no dislocation of the cervical spine.  Multilevel degenerative disease is present involving both discs and facets.  Degenerative disc disease is most pronounced at C5-V6 and C6-C7.  Facet arthropathy is generally greater on the left than right and could produce foraminal encroachment at multiple levels.  Carotid atherosclerosis is present.  Lung apices are within normal limits.  Paranasal sinus disease partially visualized.  IMPRESSION: No acute abnormality.  Multilevel cervical spondylosis and facet arthrosis.   Original Report Authenticated By: Andreas Newport, M.D.     Assessment: 72 y.o. male brought to [facility] ED after sustaining a fall at  home and noted to have dysarthria upon assessment by EMS. NIHSS 3. CT brain unremarkable. He is out of the window for thrombolysis and there is a concern that ETOH could be playing a role in his current presentation. ETOH level pending. Can not have MRI due to pacemaker.  Will suggest admission overnight and we will see him again in the morning.   Stroke Risk Factors - HTN, CAD  Wyatt Portela ,MD [facility] [phone]  04/28/2013, 7:09 PM

## 2013-04-28 NOTE — Progress Notes (Signed)
PHARMACIST - PHYSICIAN ORDER COMMUNICATION  CONCERNING: P&T Medication Policy on Herbal Medications  DESCRIPTION:  This patient's order for:  Glucosamine and chondroitin  has been noted.  This product(s) is classified as an "herbal" or natural product. Due to a lack of definitive safety studies or FDA approval, nonstandard manufacturing practices, plus the potential risk of unknown drug-drug interactions while on inpatient medications, the Pharmacy and Therapeutics Committee does not permit the use of "herbal" or natural products of this type within [facility].   ACTION TAKEN: The pharmacy department is unable to verify this order at this time and your patient has been informed of this safety policy. Please reevaluate patient's clinical condition at discharge and address if the herbal or natural product(s) should be resumed at that time.   Janice Coffin

## 2013-04-28 NOTE — ED Notes (Signed)
Pt reporting neck pain since fall. CT cervical ordered.

## 2013-04-29 DIAGNOSIS — F10229 Alcohol dependence with intoxication, unspecified: Secondary | ICD-10-CM

## 2013-04-29 LAB — BASIC METABOLIC PANEL
CO2: 24 mEq/L (ref 19–32)
Calcium: 9.2 mg/dL (ref 8.4–10.5)
Creatinine, Ser: 0.48 mg/dL — ABNORMAL LOW (ref 0.50–1.35)
GFR calc non Af Amer: >90 mL/min (ref >90)
Sodium: 131 mEq/L — ABNORMAL LOW (ref 135–145)

## 2013-04-29 LAB — TSH: TSH: 3.217 u[IU]/mL (ref 0.350–4.500)

## 2013-04-29 MED ORDER — THIAMINE HCL 100 MG PO TABS: 100.0000 mg | ORAL_TABLET | Freq: Every day | ORAL | Status: DC

## 2013-04-29 MED ORDER — MUPIROCIN 2 % EX OINT: TOPICAL_OINTMENT | Freq: Three times a day (TID) | CUTANEOUS | Status: AC

## 2013-04-29 MED ORDER — FOLIC ACID 1 MG PO TABS: 1.0000 mg | ORAL_TABLET | Freq: Every day | ORAL | Status: DC

## 2013-04-29 NOTE — Discharge Summary (Signed)
Physician Discharge Summary  GURTAJ RUZ ZOX:[id number] DOB: 12/08/1940 DOA: 04/28/2013  PCP: Hoyle Sauer, MD  Admit date: 04/28/2013 Discharge date: 04/29/2013  Recommendations for Outpatient Follow-up:  1. Follow up with Dr. Felipa Eth in 1 month at already scheduled appointment.  Patient is going to slowly wean himself down on his alcohol, possibly even OFF alcohol before that appointment.  If he is still drinking, he is planning to set a quit date and would like to discuss librium or ativan taper to help him quit altogether.   2. Orthopedics, may need new referral from PCP.   3. Oncology at already scheduled appointments.  4. [place] [facility] for follow up of pacemaker and atrial arrhythmias at already scheduled appointments 5. Pulmonology for OSA follow up at already scheduled appointments  Discharge Diagnoses:  Principal Problem:   Alcohol intoxication in active alcoholic Active Problems:   MULTIPLE  MYELOMA   SMOKER   HYPERTENSION   Atrioventricular block, heart high-grade   Atrial tachycardia/fibrillation   PACEMAKER, 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 male with hx of HTN, sleep apnea, obesity, heart block s/p PPM, CAD, active alcohol abuse, presents to the ER as code stroke, intoxicated, and was having slurred speech, with question of right sided weakness. In the ER, he was found to have no focal weakness, and was found to be intoxicated. His BAL was 255, with NA 130, and normal WBC. His head CT was negative. He was out of the TPA window. Neurology was consulted and recommended to admit him for OBS, since his intoxication was confounding the clinical picture. He also has a PPM and  cannot have an MRI. Hospitialsist was asked to admit him for OBS.  Hospital Course:   Mr. Lazo had neurologic symptoms secondary to alcohol intoxication that improved overnight.  He was seen by neurology who did not recommend any further testing.  His primary problem is alcohol dependence.  He admits to drinking wine and beer, however, he would not tell me exactly how much he has been drinking recently.  Definitely more than 6 beers and some wine.  Denies hard liquor but drinks alcohol all day long starting in the morning.  Alcohol level was 255 at admission and his electrolytes were consistent with dehydration and possibly some beer potomania.  His electrolytes improved with hydration.  We had a long conversation about weaning off alcohol.  The patient is thinking about quitting, but has not made the decision yet to quit.  He states he will cut back on his alcohol some before his primary care doctor appointment.  I told him to cut back slowly, not more than a beer a day because he is at risk of seizures and death.  He voiced understanding.  He was given prescriptions for thiamine and folate.  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  He has seen orthopedics in the past about knee surgery and is interested in this option.  I advised him that he should wean himself off alcohol and continue to not smoke before any surgeries.    Telemetry:  Paced rhythm  Rest of chronic medical problems were  stable.    Procedures:  CT head  Consultations:  Neurology  Discharge Exam: Filed Vitals:   04/29/13 0606  BP: 125/61  Pulse: 60  Temp: 97.4 F (36.3 C)  Resp: 19   Filed Vitals:   04/28/13 2347 04/29/13 0203 04/29/13 0400 04/29/13 0606  BP:  114/58 122/62 125/61  Pulse: 64 70 64 60  Temp:  97.7 F (36.5 C)  97.4 F (36.3 C)  TempSrc:  Axillary  Axillary  Resp: 20 18 20 19   Height:      Weight:      SpO2:  98%  98%    General: CM, no acute  distress HEENT:  NCAT, MMM Cardiovascular: RRR, no mrg Respiratory: CTAB, no increased WOB ABD:  NABS, soft, nondistended, nontender MSK:  No LEE, normal tone and bulk Skin:  Abrasion of the right scalp without induration or erythema.  Skin is tanned.    Discharge Instructions      Discharge Orders   Future Appointments Provider Department Dept Phone   06/06/2013 8:30 AM [facility] MEDICAL ONCOLOGY [phone]   06/06/2013 9:00 AM [facility] [phone]   08/01/2013 10:30 AM Barbaraann Share, MD [facility] [phone]   08/06/2013 1:30 PM Duke Salvia, MD [facility] Main Office [place]) [phone]   08/06/2013 2:30 PM Wendall Stade, MD [facility] Main Office [place]) [phone]   Future Orders Complete By Expires   Call MD for:  difficulty breathing, headache or visual disturbances  As directed    Call MD for:  extreme fatigue  As directed    Call MD for:  hives  As directed    Call MD for:  persistant dizziness or light-headedness  As directed    Call MD for:  persistant nausea and vomiting  As directed    Call MD for:  severe uncontrolled pain  As directed    Call MD for:  temperature >100.4  As directed    Diet - low sodium heart healthy  As directed    Discharge instructions  As directed    Comments:     You were hospitalized with a fall and alcohol intoxication which appeared similar to a stroke.  Please slowly cut back on your alcohol but approximately 1 beer per day until you are able to wean yourself off.  Do NOT quit suddenly without talking to Dr. Felipa Eth first.  Cutting back too quickly or stopping suddenly can increase the risk of withdrawal including hallucinations, seizures, and even death.  Drinking alcohol can cause vitamin deficiencies, so I have started you on thiamine and folate supplements to take in addition to your other medications.  If you  have not been able to wean off of alcohol by the time you see Dr. Felipa Eth in clinic, make sure you have set a QUIT date for right after that appointment and Dr. Felipa Eth may be able to give you some medications to help you get off alcohol altogether.  Stay hydrated if you are working outdoors and follow up with orthopedics regarding your knee pain and instability after you have quit alcohol.   Increase activity slowly  As directed        Medication List         aspirin 81 MG tablet  Take 81 mg by mouth daily.     atenolol 50 MG tablet  Commonly known as:  TENORMIN  TAKE ONE TABLET BY  MOUTH EVERY DAY     Fish Oil 1200 MG Caps  Take 1 capsule by mouth daily.     folic acid 1 MG tablet  Commonly known as:  FOLVITE  Take 1 tablet (1 mg total) by mouth daily.     glucosamine-chondroitin 500-400 MG tablet  Take 1 tablet by mouth daily.     losartan 100 MG tablet  Commonly known as:  COZAAR  Take 100 mg by mouth daily.     multivitamin capsule  Take 1 capsule by mouth daily.     mupirocin ointment 2 %  Commonly known as:  BACTROBAN  Apply topically 3 (three) times daily. Apply to scalp abrasion     NITROSTAT 0.4 MG SL tablet  Generic drug:  nitroGLYCERIN  DISSOLVE ONE TABLET UNDER THE TONGUE EVERY 5 MINUTES AS NEEDED FOR CHEST PAIN.  DO NOT EXCEED A TOTAL OF 3 DOSES IN 15 MINUTES     NON FORMULARY  CPAP Machine at night     SF 5000 PLUS 1.1 % Crea dental cream  Generic drug:  sodium fluoride  as needed.     thiamine 100 MG tablet  Take 1 tablet (100 mg total) by mouth daily.       Follow-up Information   Follow up with Hoyle Sauer, MD. Schedule an appointment as soon as possible for a visit in 1 month.   Specialty:  Internal Medicine   Contact information:   [address] [facility] [place] [address] [phone]       Follow up with Barbaraann Share, MD. (already scheduled appointment)    Specialty:  Pulmonary Disease   Contact  information:   [address] [place] [address] [phone]       Follow up with Sherryl Manges, MD. (already scheduled appointment)    Specialty:  Cardiology   Contact information:   [address] [place] [address] [phone]       Follow up with Charlton Haws, MD. (already scheduled appointment)    Specialty:  Cardiology   Contact information:   [address] [place] [address] [phone]        The results of significant diagnostics from this hospitalization (including imaging, microbiology, ancillary and laboratory) are listed below for reference.    Significant Diagnostic Studies: Ct Head Wo Contrast  04/28/2013   *RADIOLOGY REPORT*  Clinical Data:  Fall.  Head trauma.  History multiple myeloma.  CT HEAD WITHOUT CONTRAST CT CERVICAL SPINE WITHOUT CONTRAST  Technique:  Multidetector CT imaging of the head and cervical spine was performed following the standard protocol without intravenous contrast.  Multiplanar CT image reconstructions of the cervical spine were also generated.  Comparison:  MRI 04/02/2008 of the cervical spine.  CT HEAD  Findings: No mass lesion, mass effect, midline shift, hydrocephalus, hemorrhage.  No acute territorial cortical ischemia/infarct. Atrophy and chronic ischemic white matter disease is present.  There is no skull fracture.  Presumed hematoma is present in the left parietal scalp near the vertex.  Small lacunar infarct in the right sub insular region.  This appears chronic. Intracranial atherosclerosis is present.  Ethmoid sinus mucosal thickening is present with similar changes in the right maxillary sinus.  Small mucous retention cyst or polyp in the right sphenoid sinus.  The mastoid air cells appear clear.  There is no skull fracture.  IMPRESSION: Atrophy and chronic ischemic white matter disease without acute intracranial abnormality.  Right sub insular lacunar infarct is probably  chronic.  Probable left parietal scalp hematoma.  CT CERVICAL SPINE  Findings: Dextroconvex torticollis is present.  There is no dislocation of the cervical spine.  Multilevel degenerative disease is present involving both discs and facets.  Degenerative disc disease is most pronounced at C5-V6 and C6-C7.  Facet arthropathy is generally greater on the left than right and could produce foraminal encroachment at multiple levels.  Carotid atherosclerosis is present.  Lung apices are within normal limits.  Paranasal sinus disease partially visualized.  IMPRESSION: No acute abnormality.  Multilevel cervical spondylosis and facet arthrosis.   Original Report Authenticated By: Andreas Newport, M.D.   Ct Cervical Spine Wo Contrast  04/28/2013   *RADIOLOGY REPORT*  Clinical Data:  Fall.  Head trauma.  History multiple myeloma.  CT HEAD WITHOUT CONTRAST CT CERVICAL SPINE WITHOUT CONTRAST  Technique:  Multidetector CT imaging of the head and cervical spine was performed following the standard protocol without intravenous contrast.  Multiplanar CT image reconstructions of the cervical spine were also generated.  Comparison:  MRI 04/02/2008 of the cervical spine.  CT HEAD  Findings: No mass lesion, mass effect, midline shift, hydrocephalus, hemorrhage.  No acute territorial cortical ischemia/infarct. Atrophy and chronic ischemic white matter disease is present.  There is no skull fracture.  Presumed hematoma is present in the left parietal scalp near the vertex.  Small lacunar infarct in the right sub insular region.  This appears chronic. Intracranial atherosclerosis is present.  Ethmoid sinus mucosal thickening is present with similar changes in the right maxillary sinus.  Small mucous retention cyst or polyp in the right sphenoid sinus.  The mastoid air cells appear clear.  There is no skull fracture.  IMPRESSION: Atrophy and chronic ischemic white matter disease without acute intracranial abnormality.  Right sub insular  lacunar infarct is probably chronic.  Probable left parietal scalp hematoma.  CT CERVICAL SPINE  Findings: Dextroconvex torticollis is present.  There is no dislocation of the cervical spine.  Multilevel degenerative disease is present involving both discs and facets.  Degenerative disc disease is most pronounced at C5-V6 and C6-C7.  Facet arthropathy is generally greater on the left than right and could produce foraminal encroachment at multiple levels.  Carotid atherosclerosis is present.  Lung apices are within normal limits.  Paranasal sinus disease partially visualized.  IMPRESSION: No acute abnormality.  Multilevel cervical spondylosis and facet arthrosis.   Original Report Authenticated By: Andreas Newport, M.D.    Microbiology: No results found for this or any previous visit (from the past 240 hour(s)).   Labs: Basic Metabolic Panel:  Recent Labs Lab 04/28/13 1851 04/28/13 1911 04/29/13 0900  NA 125* 130* 131*  K 3.8 3.9 4.1  CL 88* 94* 97  CO2 25  --  24  GLUCOSE 97 100* 157*  BUN 6 4* 5*  CREATININE 0.56 1.10 0.48*  CALCIUM 8.8  --  9.2   Liver Function Tests:  Recent Labs Lab 04/28/13 1851  AST 74*  ALT 52  ALKPHOS 68  BILITOT 0.6  PROT 8.1  ALBUMIN 3.6   No results found for this basename: LIPASE, AMYLASE,  in the last 168 hours No results found for this basename: AMMONIA,  in the last 168 hours CBC:  Recent Labs Lab 04/28/13 1851 04/28/13 1911  WBC 7.3  --   NEUTROABS 5.0  --   HGB 13.2 13.3  HCT 34.9* 39.0  MCV 92.6  --   PLT 218  --  Cardiac Enzymes:  Recent Labs Lab 04/28/13 1851  CKTOTAL 522*  CKMB 14.6*  TROPONINI <0.30   BNP: BNP (last 3 results) No results found for this basename: PROBNP,  in the last 8760 hours CBG:  Recent Labs Lab 04/28/13 1907  GLUCAP 87    Time coordinating discharge: 45 minutes  Signed:  Cherylyn Sundby  [facility] 04/29/2013, 11:51 AM

## 2013-04-29 NOTE — H&P (Signed)
[facility] History and Physical  VEGAS FRITZE ZOX:[id number] DOB: 07-Oct-1940    PCP:   Hoyle Sauer, MD   Chief Complaint: Intoxicated, came in as code stroke.  HPI: ARSHAWN VALDEZ is an 72 y.o. male with hx of HTN, sleep apnea, obesity, heart block s/p PPM, CAD, active alcohol abuse, presents to the ER as code stroke, intoxicated, and was having slurred speech, with question of right sided weakness.  In the ER, he was found to have no focal weakness, and was found to be intoxicated.  His BAL was 255, with NA 130, and normal WBC.  His head CT was negative.  He was out of the TPA window.  Neurology was consulted and recommended to admit him for OBS, since his intoxication was confounding the clinical picture.  He also has a PPM and cannot have an MRI.  Hospitialsist was asked to admit him for OBS.  Rewiew of Systems:  Constitutional: Negative for malaise, fever and chills. No significant weight loss or weight gain Eyes: Negative for eye pain, redness and discharge, diplopia, visual changes, or flashes of light. ENMT: Negative for ear pain, hoarseness, nasal congestion, sinus pressure and sore throat. No headaches; tinnitus, drooling, or problem swallowing. Cardiovascular: Negative for chest pain, palpitations, diaphoresis, dyspnea and peripheral edema. ; No orthopnea, PND Respiratory: Negative for cough, hemoptysis, wheezing and stridor. No pleuritic chestpain. Gastrointestinal: Negative for nausea, vomiting, diarrhea, constipation, abdominal pain, melena, blood in stool, hematemesis, jaundice and rectal bleeding.    Genitourinary: Negative for frequency, dysuria, incontinence,flank pain and hematuria; Musculoskeletal: Negative for back pain and neck pain. Negative for swelling and trauma.;  Skin: . Negative for pruritus, rash, abrasions, bruising and skin lesion.; ulcerations Neuro: Negative for headache, lightheadedness and neck stiffness. Negative for weakness, altered level of  consciousness , burning feet, involuntary movement, seizure and syncope.  Psych: negative for anxiety, depression, insomnia, tearfulness, panic attacks, hallucinations, paranoia, suicidal or homicidal ideation    Past Medical History  Diagnosis Date  . CAD (coronary artery disease)     A.  s/p CFX in the past;   B.  cath 06/2010: LAD 30-40%, D1 50%, OM1 50%, AVCFX stent 30%, dCFX 70-80% (med Rx), mRCA 40%;      C.  Echo 06/2010: EF 60-65%, mod LVH; mild LAE     . Heart block   . 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 Hypertension   . Multiple myeloma   . Sleep apnea   . Degenerative disc disease   . Venous insufficiency   . Cubital tunnel syndrome   . Atrial flutter     A. s/p prior ablation;  B.  s/p CTI ablation 10/24/10 (Dr. Graciela Husbands)    Past Surgical History  Procedure Laterality Date  . Coronary angioplasty with stent placement  2005  . Ventral hernia repair    . Right olecranon nerve surgery      Medications:  HOME MEDS: Prior to Admission medications   Medication Sig Start Date End Date Taking? Authorizing Provider  aspirin 81 MG tablet Take 81 mg by mouth daily.     Yes Historical Provider, MD  atenolol (TENORMIN) 50 MG tablet TAKE ONE TABLET BY MOUTH EVERY DAY 08/28/12  Yes Duke Salvia, MD  glucosamine-chondroitin 500-400 MG tablet Take 1 tablet by mouth daily.     Yes Historical Provider, MD  losartan (COZAAR) 100 MG tablet Take 100 mg by mouth daily.   Yes Historical Provider, MD  Multiple Vitamin (  MULTIVITAMIN) capsule Take 1 capsule by mouth daily.     Yes Historical Provider, MD  NITROSTAT 0.4 MG SL tablet DISSOLVE ONE TABLET UNDER THE TONGUE EVERY 5 MINUTES AS NEEDED FOR CHEST PAIN.  DO NOT EXCEED A TOTAL OF 3 DOSES IN 15 MINUTES 07/06/12  Yes Wendall Stade, MD  NON FORMULARY CPAP Machine at night   Yes Historical Provider, MD  Omega-3 Fatty Acids (FISH OIL) 1200 MG CAPS Take 1 capsule by mouth daily.     Yes Historical Provider, MD  SF 5000  PLUS 1.1 % CREA dental cream as needed.  05/25/12  Yes Historical Provider, MD     Allergies:  No Known Allergies  Social History:   reports that he quit smoking about 8 years ago. His smoking use included Cigarettes. He has a 49 pack-year smoking history. He has quit using smokeless tobacco. He reports that he drinks about 14.0 ounces of alcohol per week. His drug history is not on file.  Family History: Family History  Problem Relation Age of Onset  . Heart disease Father   . Cancer Father     prostate  . Heart disease Brother      Physical Exam: Filed Vitals:   04/28/13 2200 04/28/13 2312 04/28/13 2347 04/29/13 0203  BP: 136/65 121/62  114/58  Pulse: 69 60 64 70  Temp:  97.9 F (36.6 C)  97.7 F (36.5 C)  TempSrc:  Oral  Axillary  Resp: 17 18 20 18   Height:  6' (1.829 m)    Weight:  99.6 kg (219 lb 9.3 oz)    SpO2: 96% 97%  98%   Blood pressure 114/58, pulse 70, temperature 97.7 F (36.5 C), temperature source Axillary, resp. rate 18, height 6' (1.829 m), weight 99.6 kg (219 lb 9.3 oz), SpO2 98.00%.  GEN:  Pleasant  patient lying in the stretcher in no acute distress; cooperative with exam. PSYCH:  alert and oriented x4; does not appear anxious or depressed; affect is appropriate. HEENT: Mucous membranes pink and anicteric; PERRLA; EOM intact; no cervical lymphadenopathy nor thyromegaly or carotid bruit; no JVD; There were no stridor. Neck is very supple. Breasts:: Not examined CHEST WALL: No tenderness CHEST: Normal respiration, clear to auscultation bilaterally.  HEART: Regular rate and rhythm.  There are no murmur, rub, or gallops.   BACK: No kyphosis or scoliosis; no CVA tenderness ABDOMEN: soft and non-tender; no masses, no organomegaly, normal abdominal bowel sounds; no pannus; no intertriginous candida. There is no rebound and no distention. Rectal Exam: Not done EXTREMITIES: No bone or joint deformity; age-appropriate arthropathy of the hands and knees; no  edema; no ulcerations.  There is no calf tenderness. Genitalia: not examined PULSES: 2+ and symmetric SKIN: Normal hydration no rash or ulceration CNS: Cranial nerves 2-12 grossly intact no focal lateralizing neurologic deficit.  Speech is fluent; uvula elevated with phonation, facial symmetry and tongue midline. DTR are normal bilaterally, cerebella exam is intact, barbinski is negative and strengths are equaled bilaterally.  No sensory loss.   Labs on Admission:  Basic Metabolic Panel:  Recent Labs Lab 04/28/13 1851 04/28/13 1911  NA 125* 130*  K 3.8 3.9  CL 88* 94*  CO2 25  --   GLUCOSE 97 100*  BUN 6 4*  CREATININE 0.56 1.10  CALCIUM 8.8  --    Liver Function Tests:  Recent Labs Lab 04/28/13 1851  AST 74*  ALT 52  ALKPHOS 68  BILITOT 0.6  PROT 8.1  ALBUMIN  3.6   No results found for this basename: LIPASE, AMYLASE,  in the last 168 hours No results found for this basename: AMMONIA,  in the last 168 hours CBC:  Recent Labs Lab 04/28/13 1851 04/28/13 1911  WBC 7.3  --   NEUTROABS 5.0  --   HGB 13.2 13.3  HCT 34.9* 39.0  MCV 92.6  --   PLT 218  --    Cardiac Enzymes:  Recent Labs Lab 04/28/13 1851  CKTOTAL 522*  CKMB 14.6*  TROPONINI <0.30    CBG:  Recent Labs Lab 04/28/13 1907  GLUCAP 87     Radiological Exams on Admission: Ct Head Wo Contrast  04/28/2013   *RADIOLOGY REPORT*  Clinical Data:  Fall.  Head trauma.  History multiple myeloma.  CT HEAD WITHOUT CONTRAST CT CERVICAL SPINE WITHOUT CONTRAST  Technique:  Multidetector CT imaging of the head and cervical spine was performed following the standard protocol without intravenous contrast.  Multiplanar CT image reconstructions of the cervical spine were also generated.  Comparison:  MRI 04/02/2008 of the cervical spine.  CT HEAD  Findings: No mass lesion, mass effect, midline shift, hydrocephalus, hemorrhage.  No acute territorial cortical ischemia/infarct. Atrophy and chronic ischemic white  matter disease is present.  There is no skull fracture.  Presumed hematoma is present in the left parietal scalp near the vertex.  Small lacunar infarct in the right sub insular region.  This appears chronic. Intracranial atherosclerosis is present.  Ethmoid sinus mucosal thickening is present with similar changes in the right maxillary sinus.  Small mucous retention cyst or polyp in the right sphenoid sinus.  The mastoid air cells appear clear.  There is no skull fracture.  IMPRESSION: Atrophy and chronic ischemic white matter disease without acute intracranial abnormality.  Right sub insular lacunar infarct is probably chronic.  Probable left parietal scalp hematoma.  CT CERVICAL SPINE  Findings: Dextroconvex torticollis is present.  There is no dislocation of the cervical spine.  Multilevel degenerative disease is present involving both discs and facets.  Degenerative disc disease is most pronounced at C5-V6 and C6-C7.  Facet arthropathy is generally greater on the left than right and could produce foraminal encroachment at multiple levels.  Carotid atherosclerosis is present.  Lung apices are within normal limits.  Paranasal sinus disease partially visualized.  IMPRESSION: No acute abnormality.  Multilevel cervical spondylosis and facet arthrosis.   Original Report Authenticated By: Andreas Newport, M.D.   Ct Cervical Spine Wo Contrast  04/28/2013   *RADIOLOGY REPORT*  Clinical Data:  Fall.  Head trauma.  History multiple myeloma.  CT HEAD WITHOUT CONTRAST CT CERVICAL SPINE WITHOUT CONTRAST  Technique:  Multidetector CT imaging of the head and cervical spine was performed following the standard protocol without intravenous contrast.  Multiplanar CT image reconstructions of the cervical spine were also generated.  Comparison:  MRI 04/02/2008 of the cervical spine.  CT HEAD  Findings: No mass lesion, mass effect, midline shift, hydrocephalus, hemorrhage.  No acute territorial cortical ischemia/infarct. Atrophy  and chronic ischemic white matter disease is present.  There is no skull fracture.  Presumed hematoma is present in the left parietal scalp near the vertex.  Small lacunar infarct in the right sub insular region.  This appears chronic. Intracranial atherosclerosis is present.  Ethmoid sinus mucosal thickening is present with similar changes in the right maxillary sinus.  Small mucous retention cyst or polyp in the right sphenoid sinus.  The mastoid air cells appear clear.  There is  no skull fracture.  IMPRESSION: Atrophy and chronic ischemic white matter disease without acute intracranial abnormality.  Right sub insular lacunar infarct is probably chronic.  Probable left parietal scalp hematoma.  CT CERVICAL SPINE  Findings: Dextroconvex torticollis is present.  There is no dislocation of the cervical spine.  Multilevel degenerative disease is present involving both discs and facets.  Degenerative disc disease is most pronounced at C5-V6 and C6-C7.  Facet arthropathy is generally greater on the left than right and could produce foraminal encroachment at multiple levels.  Carotid atherosclerosis is present.  Lung apices are within normal limits.  Paranasal sinus disease partially visualized.  IMPRESSION: No acute abnormality.  Multilevel cervical spondylosis and facet arthrosis.   Original Report Authenticated By: Andreas Newport, M.D.   Assessment/Plan Present on Admission:  . Alcohol intoxication in active alcoholic . Right-sided muscle weakness . Atrioventricular block, heart high-grade . Atrial tachycardia/fibrillation . MULTIPLE  MYELOMA . PACEMAKER, [employer] dual-chamber . SMOKER . HYPERTENSION  PLAN:  This gentleman presents with slurred speech and ?right sided weakness, but he was also intoxicated.  I agree that his neurological exam may totally clear tomorrow.  I didn't appreciate any focal weakness, but he has slightly slurred speech.  He cannot have an MRI due to PPM, but we can reassess him  tomorrow prior to engaging in any further work up. He is at risk for ETOH withdrawal and will be placed to CIWA protocol.  He is on ASA, stable, full code, and will be admitted to [facility] service.  Thank you for allowing me to participate in his care.  Other plans as per orders.  Code Status: FULL Unk Lightning, MD. [facility] Pager [phone] 7pm to 7am.  04/29/2013, 2:17 AM

## 2013-04-29 NOTE — Progress Notes (Signed)
NEURO HOSPITALIST PROGRESS NOTE   SUBJECTIVE:                                                                                                                        Stated that his body feels sore from the fall but otherwise offers no neurological complains. Speech is back to baseline. ETOH level yesterday 255.   OBJECTIVE:                                                                                                                           Vital signs in last 24 hours: Temp:  [97.4 F (36.3 C)-97.9 F (36.6 C)] 97.4 F (36.3 C) (08/18 0606) Pulse Rate:  [59-72] 60 (08/18 0606) Resp:  [12-22] 19 (08/18 0606) BP: (114-136)/(56-77) 125/61 mmHg (08/18 0606) SpO2:  [92 %-98 %] 98 % (08/18 0606) Weight:  [99.6 kg (219 lb 9.3 oz)] 99.6 kg (219 lb 9.3 oz) (08/17 2312)  Intake/Output from previous day: 08/17 0701 - 08/18 0700 In: -  Out: 600 [Urine:600] Intake/Output this shift: Total I/O In: 240 [P.O.:240] Out: -  Nutritional status: General  Past Medical History  Diagnosis Date  . CAD (coronary artery disease)     A.  s/p CFX in the past;   B.  cath 06/2010: LAD 30-40%, D1 50%, OM1 50%, AVCFX stent 30%, dCFX 70-80% (med Rx), mRCA 40%;      C.  Echo 06/2010: EF 60-65%, mod LVH; mild LAE     . Heart block   . 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 Hypertension   . Multiple myeloma   . Sleep apnea   . Degenerative disc disease   . Venous insufficiency   . Cubital tunnel syndrome   . Atrial flutter     A. s/p prior ablation;  B.  s/p CTI ablation 10/24/10 (Dr. Graciela Husbands)    Neurologic Exam:  Mental Status: Alert, awake, oriented x 4, thought content appropriate.  Speech fluent without evidence of aphasia.  Able to follow 3 step commands without difficulty. Cranial Nerves: II: Discs flat bilaterally; Visual Zurn grossly normal, pupils equal, round, reactive to light and accommodation III,IV, VI: ptosis not present,  extra-ocular motions intact bilaterally V,VII: smile symmetric,  facial light touch sensation normal bilaterally VIII: hearing normal bilaterally IX,X: gag reflex present XI: bilateral shoulder shrug XII: midline tongue extension Motor: Right : Upper extremity   5/5    Left:     Upper extremity   5/5  Lower extremity   5/5     Lower extremity   5/5 Tone and bulk:normal tone throughout; no atrophy noted Sensory: Pinprick and light touch intact throughout, bilaterally Deep Tendon Reflexes:  1+ all over Plantars: Right: downgoing   Left: downgoing Cerebellar: normal finger-to-nose,  normal heel-to-shin test Gait:  No ataxia. CV: pulses palpable throughout    Lab Results: Lab Results  Component Value Date/Time   CHOL 146 10/17/2007  9:46 AM   Lipid Panel No results found for this basename: CHOL, TRIG, HDL, CHOLHDL, VLDL, LDLCALC,  in the last 72 hours  Studies/Results: Ct Head Wo Contrast  04/28/2013   *RADIOLOGY REPORT*  Clinical Data:  Fall.  Head trauma.  History multiple myeloma.  CT HEAD WITHOUT CONTRAST CT CERVICAL SPINE WITHOUT CONTRAST  Technique:  Multidetector CT imaging of the head and cervical spine was performed following the standard protocol without intravenous contrast.  Multiplanar CT image reconstructions of the cervical spine were also generated.  Comparison:  MRI 04/02/2008 of the cervical spine.  CT HEAD  Findings: No mass lesion, mass effect, midline shift, hydrocephalus, hemorrhage.  No acute territorial cortical ischemia/infarct. Atrophy and chronic ischemic white matter disease is present.  There is no skull fracture.  Presumed hematoma is present in the left parietal scalp near the vertex.  Small lacunar infarct in the right sub insular region.  This appears chronic. Intracranial atherosclerosis is present.  Ethmoid sinus mucosal thickening is present with similar changes in the right maxillary sinus.  Small mucous retention cyst or polyp in the right sphenoid  sinus.  The mastoid air cells appear clear.  There is no skull fracture.  IMPRESSION: Atrophy and chronic ischemic white matter disease without acute intracranial abnormality.  Right sub insular lacunar infarct is probably chronic.  Probable left parietal scalp hematoma.  CT CERVICAL SPINE  Findings: Dextroconvex torticollis is present.  There is no dislocation of the cervical spine.  Multilevel degenerative disease is present involving both discs and facets.  Degenerative disc disease is most pronounced at C5-V6 and C6-C7.  Facet arthropathy is generally greater on the left than right and could produce foraminal encroachment at multiple levels.  Carotid atherosclerosis is present.  Lung apices are within normal limits.  Paranasal sinus disease partially visualized.  IMPRESSION: No acute abnormality.  Multilevel cervical spondylosis and facet arthrosis.   Original Report Authenticated By: Andreas Newport, M.D.   Ct Cervical Spine Wo Contrast  04/28/2013   *RADIOLOGY REPORT*  Clinical Data:  Fall.  Head trauma.  History multiple myeloma.  CT HEAD WITHOUT CONTRAST CT CERVICAL SPINE WITHOUT CONTRAST  Technique:  Multidetector CT imaging of the head and cervical spine was performed following the standard protocol without intravenous contrast.  Multiplanar CT image reconstructions of the cervical spine were also generated.  Comparison:  MRI 04/02/2008 of the cervical spine.  CT HEAD  Findings: No mass lesion, mass effect, midline shift, hydrocephalus, hemorrhage.  No acute territorial cortical ischemia/infarct. Atrophy and chronic ischemic white matter disease is present.  There is no skull fracture.  Presumed hematoma is present in the left parietal scalp near the vertex.  Small lacunar infarct in the right sub insular region.  This appears chronic. Intracranial atherosclerosis is present.  Ethmoid sinus  mucosal thickening is present with similar changes in the right maxillary sinus.  Small mucous retention cyst or  polyp in the right sphenoid sinus.  The mastoid air cells appear clear.  There is no skull fracture.  IMPRESSION: Atrophy and chronic ischemic white matter disease without acute intracranial abnormality.  Right sub insular lacunar infarct is probably chronic.  Probable left parietal scalp hematoma.  CT CERVICAL SPINE  Findings: Dextroconvex torticollis is present.  There is no dislocation of the cervical spine.  Multilevel degenerative disease is present involving both discs and facets.  Degenerative disc disease is most pronounced at C5-V6 and C6-C7.  Facet arthropathy is generally greater on the left than right and could produce foraminal encroachment at multiple levels.  Carotid atherosclerosis is present.  Lung apices are within normal limits.  Paranasal sinus disease partially visualized.  IMPRESSION: No acute abnormality.  Multilevel cervical spondylosis and facet arthrosis.   Original Report Authenticated By: Andreas Newport, M.D.    MEDICATIONS                                                                                                                       I have reviewed the patient's current medications.  ASSESSMENT/PLAN:                                                                                                            ETOH intoxication mimicking stroke. Back to baseline. No further neurological intervention needed. Will sign off. Please, call neurology with questions, concerns, or new developments.   Wyatt Portela, MD [facility] [phone]  04/29/2013, 9:13 AM

## 2013-05-26 ENCOUNTER — Other Ambulatory Visit: Payer: Self-pay | Admitting: Internal Medicine

## 2013-06-06 ENCOUNTER — Ambulatory Visit (HOSPITAL_BASED_OUTPATIENT_CLINIC_OR_DEPARTMENT_OTHER): Payer: Medicare Other | Admitting: Internal Medicine

## 2013-06-06 ENCOUNTER — Telehealth: Payer: Self-pay | Admitting: Internal Medicine

## 2013-06-06 ENCOUNTER — Other Ambulatory Visit (HOSPITAL_BASED_OUTPATIENT_CLINIC_OR_DEPARTMENT_OTHER): Payer: Medicare Other

## 2013-06-06 VITALS — BP 138/76 | HR 85 | Temp 98.0°F | Resp 20 | Ht 72.0 in | Wt 212.5 lb

## 2013-06-06 DIAGNOSIS — D472 Monoclonal gammopathy: Secondary | ICD-10-CM

## 2013-06-06 DIAGNOSIS — C9000 Multiple myeloma not having achieved remission: Secondary | ICD-10-CM

## 2013-06-06 DIAGNOSIS — F10229 Alcohol dependence with intoxication, unspecified: Secondary | ICD-10-CM

## 2013-06-06 DIAGNOSIS — F10129 Alcohol abuse with intoxication, unspecified: Secondary | ICD-10-CM

## 2013-06-06 LAB — COMPREHENSIVE METABOLIC PANEL (CC13)
Albumin: 3.2 g/dL — ABNORMAL LOW (ref 3.5–5.0)
BUN: 6.6 mg/dL — ABNORMAL LOW (ref 7.0–26.0)
CO2: 26 mEq/L (ref 22–29)
Glucose: 127 mg/dl (ref 70–140)
Potassium: 4.3 mEq/L (ref 3.5–5.1)
Sodium: 136 mEq/L (ref 136–145)
Total Protein: 8.2 g/dL (ref 6.4–8.3)

## 2013-06-06 LAB — LACTATE DEHYDROGENASE (CC13): LDH: 162 U/L (ref 125–245)

## 2013-06-06 LAB — CBC WITH DIFFERENTIAL/PLATELET
BASO%: 0.6 % (ref 0.0–2.0)
LYMPH%: 19 % (ref 14.0–49.0)
MCHC: 34.6 g/dL (ref 32.0–36.0)
MONO#: 0.5 10*3/uL (ref 0.1–0.9)
MONO%: 10 % (ref 0.0–14.0)
NEUT#: 3.1 10*3/uL (ref 1.5–6.5)
Platelets: 150 10*3/uL (ref 140–400)
RBC: 3.84 10*6/uL — ABNORMAL LOW (ref 4.20–5.82)
RDW: 12.4 % (ref 11.0–14.6)
WBC: 4.9 10*3/uL (ref 4.0–10.3)
nRBC: 0 % (ref 0–0)

## 2013-06-06 NOTE — Progress Notes (Signed)
[facility] OFFICE PROGRESS NOTE  Hoyle Sauer, MD [address] [facility], [place]. 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 male with a history as noted above presents for followup appointment. He was last seen by Dr. Kimberlee Nearing on 12/03/2012. He reports feeling well overall. Patient did report a fall several weeks ago. He reports that he was picking okra and his garden on hot day and fell face first in hit head and the right side of his face against the ground. He was admitted overnight for observation and he reports a negative CT of the head. He also reports that he was drinking prior to this episode causes classmates were found on that day here note he's had prior falls. He hasn't be several his second appointment with his primary care physician Dr. [name]. He denies chest pain or shortness of breath prior to this fall. Patient otherwise has no major changes since his last visit. He doesn't worse arthritis in his knees bilaterally.  MEDICAL HISTORY: Past Medical History  Diagnosis Date  . CAD (coronary artery disease)     A.  s/p CFX in the past;   B.  cath 06/2010: LAD 30-40%, D1 50%, OM1 50%, AVCFX stent 30%, dCFX 70-80% (med Rx), mRCA 40%;      C.  Echo 06/2010: EF 60-65%, mod LVH; mild LAE     . Heart block   . 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 Hypertension   . Multiple myeloma   . Sleep apnea   . Degenerative disc disease   . Venous insufficiency   . Cubital tunnel syndrome   . Atrial flutter     A. s/p prior ablation;  B.  s/p CTI ablation 10/24/10 (Dr. Graciela Husbands)    INTERIM HISTORY: has MULTIPLE  MYELOMA; SMOKER; OBSTRUCTIVE SLEEP  APNEA; HYPERTENSION; Atrioventricular block, heart high-grade; Atrial tachycardia/fibrillation; SICK SINUS/ TACHY-BRADY SYNDROME; VENOUS INSUFFICIENCY; SCIATICA; SHORTNESS OF BREATH; ALLERGY; PACEMAKER, St. 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    ALLERGIES:  has No Known Allergies.  MEDICATIONS: has a current medication list which includes the following prescription(s): aspirin, atenolol, folic acid, glucosamine-chondroitin, losartan, multivitamin, nitrostat, NON FORMULARY, fish oil, sf 5000 plus, and thiamine.  SURGICAL HISTORY:  Past Surgical History  Procedure Laterality Date  . Coronary angioplasty with stent placement  2005  . Ventral hernia repair    . Right olecranon nerve surgery      REVIEW OF SYSTEMS:   Constitutional: Denies fevers, chills or abnormal weight loss Eyes: Denies blurriness of vision Ears, nose, mouth, throat, and face: Denies mucositis or sore throat Respiratory: Denies cough, dyspnea or wheezes Cardiovascular: Denies palpitation, chest discomfort or lower extremity swelling Gastrointestinal:  Denies nausea, heartburn or change in bowel habits Skin: Denies abnormal skin rashes Lymphatics: Denies new lymphadenopathy or easy bruising Neurological:Denies numbness, tingling or new weaknesses Behavioral/Psych: Mood is stable, no new changes  All other systems were reviewed with the patient and are negative.  PHYSICAL EXAMINATION: ECOG PERFORMANCE STATUS: 0 - Asymptomatic  Blood pressure 138/76, pulse 85, temperature 98 F (36.7 C), temperature source Oral, resp.  rate 20, height 6' (1.829 m), weight 212 lb 8 oz (96.389 kg).  GENERAL:alert, no distress and comfortable, pleasant elderly male who appears his stated age. HEAD: Normal cephalic atraumatic; no bruising  identified. SKIN: skin color, texture, turgor are normal, no rashes or significant lesions EYES: normal, Conjunctiva are pink and non-injected, sclera clear OROPHARYNX:no exudate, no erythema and lips, buccal mucosa, and tongue normal  NECK: supple, thyroid normal size, non-tender, without nodularity LYMPH:  no palpable lymphadenopathy in the cervical, axillary or supraclavicular LUNGS: clear to auscultation and percussion with normal breathing effort HEART: regular rate & rhythm and no murmurs and no lower extremity edema ABDOMEN:abdomen soft, non-tender and normal bowel sounds Musculoskeletal:no cyanosis of digits and no clubbing  NEURO: alert & oriented x 3 with fluent speech, no focal motor/sensory deficits   LABORATORY DATA: Results for orders placed in visit on 06/06/13 (from the past 48 hour(s))  CBC WITH DIFFERENTIAL     Status: Abnormal   Collection Time    06/06/13  8:20 AM      Result Value Range   WBC 4.9  4.0 - 10.3 10e3/uL   NEUT# 3.1  1.5 - 6.5 10e3/uL   HGB 12.9 (*) 13.0 - 17.1 g/dL   HCT 40.9 (*) 81.1 - 91.4 %   Platelets 150  140 - 400 10e3/uL   MCV 97.1  79.3 - 98.0 fL   MCH 33.6 (*) 27.2 - 33.4 pg   MCHC 34.6  32.0 - 36.0 g/dL   RBC 7.82 (*) 9.56 - 2.13 10e6/uL   RDW 12.4  11.0 - 14.6 %   lymph# 0.9  0.9 - 3.3 10e3/uL   MONO# 0.5  0.1 - 0.9 10e3/uL   Eosinophils Absolute 0.3  0.0 - 0.5 10e3/uL   Basophils Absolute 0.0  0.0 - 0.1 10e3/uL   NEUT% 63.7  39.0 - 75.0 %   LYMPH% 19.0  14.0 - 49.0 %   MONO% 10.0  0.0 - 14.0 %   EOS% 6.7  0.0 - 7.0 %   BASO% 0.6  0.0 - 2.0 %   nRBC 0  0 - 0 %  COMPREHENSIVE METABOLIC PANEL (CC13)     Status: Abnormal   Collection Time    06/06/13  8:21 AM      Result Value Range   Sodium 136  136 - 145 mEq/L   Potassium 4.3  3.5 - 5.1 mEq/L   Chloride 99  98 - 109 mEq/L   CO2 26  22 - 29 mEq/L   Glucose 127  70 - 140 mg/dl   BUN 6.6 (*) 7.0 - 08.6 mg/dL   Creatinine 0.7  0.7 - 1.3 mg/dL   Total Bilirubin 5.78  0.20 - 1.20  mg/dL   Alkaline Phosphatase 81  40 - 150 U/L   AST 26  5 - 34 U/L   ALT 21  0 - 55 U/L   Total Protein 8.2  6.4 - 8.3 g/dL   Albumin 3.2 (*) 3.5 - 5.0 g/dL   Calcium 9.3  8.4 - 46.9 mg/dL  LACTATE DEHYDROGENASE (CC13)     Status: None   Collection Time    06/06/13  8:21 AM      Result Value Range   LDH 162  125 - 245 U/L      Results for NOLEN, LINDAMOOD (MRN [id number]) as of 06/07/2013 14:46  Ref. Range 06/06/2013 08:21  IgG (Immunoglobin G), Serum Latest Range: [phone] mg/dL 244 (L)  IgA Latest Range:  68-379 mg/dL 89  IgM, Serum Latest Range: 41-251 mg/dL 4782 (H)    RADIOGRAPHIC STUDIES: No results found.  ASSESSMENT: 1. 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 2 multiple falls in the elderly.   PLAN:  --[name] continues to do fairly well with no evidence for progressive disease. He has a plasma cell dyscrasia characterized by significantly elevated IgM level and IgM lambda monoclonal myopathy as well as a low IgG level return to our clinic in 6 months at which time we will check a CBC, chemistries and quantitative immunoglobulins. --Patient was counseled extensively on fall prevention in the elderly and fall precautions. He was advised to take a multivitamin and calcium and vitamin D. He was given a handout about falls in the elderly.  All questions were answered. The patient knows to call the clinic with any problems, questions or concerns. We can certainly see the patient much sooner if necessary. He was provided an after visit summary of today's visit.   The patient and plan discussed with Deven Furia and he is in agreement with the aforementioned.  I spent 10 minutes counseling the patient face to face. The total time spent in the appointment was 20 minutes.    Ilia Engelbert, MD 06/06/2013 9:56 AM

## 2013-06-06 NOTE — Patient Instructions (Addendum)
MGUS/Multiple Myeloma Multiple myeloma is the most common cancer of bone. It is caused by the uncontrolled multiplication of a type of white blood cell in the marrow. This white blood cell is called a plasma cell. This means the bone marrow is overworking producing plasma cells. Soon these overproduced cells begin to take up room in the marrow that is needed by other cells. This means that there are soon not enough red or white blood cells or platelets. Not enough red cells mean that the person is anemic. There are not enough red blood cells to carry oxygen around the body. There are not enough white blood cells to fight disease. This causes the person with multiple myeloma to not feel well. There is also bone pain through much of the body. SYMPTOMS  Anemia causes fatigue (tiredness) and weakness.  Back pain is common. This is from fractures (break in bones) caused by damage to the bones of the back.  Lack of white blood cells makes infection more likely.  Bleeding is a common problem from lack of the cells (platelets). Platelets help blood clots form. This may show up as bleeding from any place. Commonly this shows up as bleeding from the nose or gums.  Fractures (bone breaks) are more common anywhere. The back and ribs are the most commonly fractured areas. DIAGNOSIS  This tumor is often suggested by blood tests. Often doing a bone marrow sample makes the diagnosis (learning what is wrong). This is a test performed by taking a small sample of bone with a small needle. This bone often comes from the sternum (breast bone). This sample is sent to a pathologist (a specialist in looking at tissue under a microscope). After looking at the sample under the microscope, the pathologist is able to make a diagnosis of the problem. X-rays may also show boney changes. TREATMENT   Occasionally, anti-cancer medications may be used with multiple myeloma. Your caregiver can discuss this with you.  Medications  can also be given to help with the bone pain.  There is no cure for multiple myeloma. Lifestyle changes can add years of quality living. HOME CARE INSTRUCTIONS  Often there is no specific treatment for multiple myeloma. Most of the treatment consists of adjustments in dietary and living activities. Some of these changes include:  Your dietitian or caregiver helping you with your dietary questions.  Taking iron and vitamins as prescribed by your caregiver.  Eating a well balanced diet.  Staying active, but follow restrictions suggested by your caregiver. Avoiding heavy lifting (more than 10 pounds) and activities that cause increased pain.  Drinking plenty of water.  Using back braces and a cane may help with some of the boney pain. SEEK IMMEDIATE MEDICAL CARE IF:  You develop severe, uncontrolled boney pain.  You or your family notices confusion, problems with decision-making or inability to stay awake.  You notice increased urination or constipation.  You notice problems holding your water or stool.  You have numbness or loss of control of your extremities (arms/hands or legs/feet). Document Released: 05/24/2001 Document Revised: 11/21/2011 Document Reviewed: 08/24/2008 [facility] Patient Information 2014 [place], [place].

## 2013-06-06 NOTE — Telephone Encounter (Signed)
gv and printed appt sched and avs for pt for Jan 2015 °

## 2013-06-07 LAB — IGG, IGA, IGM
IgG (Immunoglobin G), Serum: 313 mg/dL — ABNORMAL LOW (ref 650–1600)
IgM, Serum: 3660 mg/dL — ABNORMAL HIGH (ref 41–251)

## 2013-06-17 ENCOUNTER — Encounter: Payer: Self-pay | Admitting: Internal Medicine

## 2013-07-30 ENCOUNTER — Ambulatory Visit: Payer: Medicare Other | Admitting: Pulmonary Disease

## 2013-08-01 ENCOUNTER — Ambulatory Visit (INDEPENDENT_AMBULATORY_CARE_PROVIDER_SITE_OTHER): Payer: Medicare Other | Admitting: Pulmonary Disease

## 2013-08-01 ENCOUNTER — Encounter: Payer: Self-pay | Admitting: Pulmonary Disease

## 2013-08-01 VITALS — BP 138/68 | HR 78 | Temp 98.3°F | Ht 72.0 in | Wt 220.4 lb

## 2013-08-01 DIAGNOSIS — G4733 Obstructive sleep apnea (adult) (pediatric): Secondary | ICD-10-CM

## 2013-08-01 NOTE — Patient Instructions (Signed)
Stay on cpap, and work on weight loss as much as possible Keep up with mask changes and supplies. followup with me again in one year.

## 2013-08-01 NOTE — Progress Notes (Signed)
  Subjective:    Patient ID: [name], male    DOB: November 02, 1940, 72 y.o.   MRN: [id number]  HPI The patient comes in today for followup of his obstructive sleep apnea.  He is wearing CPAP compliantly, and is having no issues with his mask or pressure.  He feels that he sleeps well with the device, although he does have some awakenings secondary to nocturia and musculoskeletal complaints.  He feels rested in the mornings, and feels that his daytime alertness is satisfactory.  He has lost 2 pounds since his last visit here.   Review of Systems  Constitutional: Negative for fever and unexpected weight change.  HENT: Negative for congestion, dental problem, ear pain, nosebleeds, postnasal drip, rhinorrhea, sinus pressure, sneezing, sore throat and trouble swallowing.   Eyes: Negative for redness and itching.  Respiratory: Negative for cough, chest tightness, shortness of breath and wheezing.   Cardiovascular: Negative for palpitations and leg swelling.  Gastrointestinal: Negative for nausea and vomiting.  Genitourinary: Negative for dysuria.  Musculoskeletal: Negative for joint swelling.  Skin: Negative for rash.  Neurological: Negative for headaches.  Hematological: Does not bruise/bleed easily.  Psychiatric/Behavioral: Negative for dysphoric mood. The patient is not nervous/anxious.        Objective:   Physical Exam Overweight male in no acute distress Nose without purulence or discharge noted Neck without lymphadenopathy or thyromegaly No skin breakdown or pressure necrosis from the CPAP mask Lower extremities with mild edema, no cyanosis Alert and oriented, moves all 4 extremities.       Assessment & Plan:

## 2013-08-01 NOTE — Assessment & Plan Note (Signed)
The patient is wearing CPAP compliantly, and feels that he sleeps well with the device.  He feels that his alertness during the day is adequate, and is having no issues with mask or pressure.  I have asked him to continue on CPAP, and to work aggressively on further weight loss.  I will see him back in one year doing well.

## 2013-08-06 ENCOUNTER — Encounter: Payer: Self-pay | Admitting: Internal Medicine

## 2013-08-06 ENCOUNTER — Ambulatory Visit (INDEPENDENT_AMBULATORY_CARE_PROVIDER_SITE_OTHER): Payer: Medicare Other | Admitting: Internal Medicine

## 2013-08-06 ENCOUNTER — Ambulatory Visit (INDEPENDENT_AMBULATORY_CARE_PROVIDER_SITE_OTHER): Payer: Medicare Other | Admitting: Cardiovascular Disease

## 2013-08-06 VITALS — BP 141/81 | HR 62 | Ht 72.0 in | Wt 216.4 lb

## 2013-08-06 VITALS — BP 141/81 | HR 62 | Ht 72.0 in | Wt 216.0 lb

## 2013-08-06 DIAGNOSIS — I10 Essential (primary) hypertension: Secondary | ICD-10-CM

## 2013-08-06 DIAGNOSIS — I495 Sick sinus syndrome: Secondary | ICD-10-CM

## 2013-08-06 DIAGNOSIS — I471 Supraventricular tachycardia: Secondary | ICD-10-CM

## 2013-08-06 DIAGNOSIS — I498 Other specified cardiac arrhythmias: Secondary | ICD-10-CM

## 2013-08-06 DIAGNOSIS — C9000 Multiple myeloma not having achieved remission: Secondary | ICD-10-CM

## 2013-08-06 DIAGNOSIS — Z950 Presence of cardiac pacemaker: Secondary | ICD-10-CM

## 2013-08-06 DIAGNOSIS — I442 Atrioventricular block, complete: Secondary | ICD-10-CM

## 2013-08-06 DIAGNOSIS — I2510 Atherosclerotic heart disease of native coronary artery without angina pectoris: Secondary | ICD-10-CM

## 2013-08-06 LAB — MDC_IDC_ENUM_SESS_TYPE_INCLINIC
Battery Remaining Longevity: 96 mo
Battery Voltage: 2.95 V
Brady Statistic RA Percent Paced: 71 %
Brady Statistic RV Percent Paced: 99.96 %
Date Time Interrogation Session: 20141125141417
Implantable Pulse Generator Model: 2110
Implantable Pulse Generator Serial Number: 7163077
Lead Channel Impedance Value: 437.5 Ohm
Lead Channel Impedance Value: 462.5 Ohm
Lead Channel Pacing Threshold Amplitude: 0.625 V
Lead Channel Pacing Threshold Amplitude: 0.75 V
Lead Channel Pacing Threshold Pulse Width: 0.3 ms
Lead Channel Pacing Threshold Pulse Width: 0.4 ms
Lead Channel Sensing Intrinsic Amplitude: 12 mV
Lead Channel Sensing Intrinsic Amplitude: 2.4 mV
Lead Channel Setting Pacing Amplitude: 1 V
Lead Channel Setting Pacing Amplitude: 1.625
Lead Channel Setting Pacing Pulse Width: 0.4 ms
Lead Channel Setting Sensing Sensitivity: 2 mV

## 2013-08-06 NOTE — Assessment & Plan Note (Signed)
F/U Dr Clementeen Graham  Had seen Murinson  Seems to like him

## 2013-08-06 NOTE — Patient Instructions (Signed)
Your physician wants you to follow-up in:  6 MONTHS WITH DR NISHAN  You will receive a reminder letter in the mail two months in advance. If you don't receive a letter, please call our office to schedule the follow-up appointment. Your physician recommends that you continue on your current medications as directed. Please refer to the Current Medication list given to you today. 

## 2013-08-06 NOTE — Assessment & Plan Note (Addendum)
The patient's device was interrogated.  The information was reviewed. Atrial sensitivity was decreased 2/2 oversensing.

## 2013-08-06 NOTE — Assessment & Plan Note (Signed)
No afib  Mode switch episodes were assoc with atrial oversensing

## 2013-08-06 NOTE — Assessment & Plan Note (Signed)
Stable post pacing 

## 2013-08-06 NOTE — Assessment & Plan Note (Signed)
Stable with no angina and good activity level.  Continue medical Rx New nitro called in  

## 2013-08-06 NOTE — Assessment & Plan Note (Signed)
Normal pacer function Merlin telephone exchange aq 3 months F/U SK 1 year

## 2013-08-06 NOTE — Patient Instructions (Signed)
Your physician recommends that you continue on your current medications as directed. Please refer to the Current Medication list given to you today.  Remote monitoring is used to monitor your Pacemaker of ICD from home. This monitoring reduces the number of office visits required to check your device to one time per year. It allows [place] to keep an eye on the functioning of your device to ensure it is working properly. You are scheduled for a device check from home on 11/07/2013. You may send your transmission at any time that day. If you have a wireless device, the transmission will be sent automatically. After your physician reviews your transmission, you will receive a postcard with your next transmission date.  Your physician wants you to follow-up in: one year with Dr. Graciela Husbands.  You will receive a reminder letter in the mail two months in advance. If you don't receive a letter, please call our office to schedule the follow-up appointment.

## 2013-08-06 NOTE — Progress Notes (Signed)
Patient ID: [name], male   DOB: 1941/04/03, 72 y.o.   MRN: [id number] [name] is a 72 y.o. male Seen in followup for atrial flutter status post ablation. He also has a history of complete heart block status post pacemaker. He is without significant symptoms of chest pain shortness of breath or edema.  He has coronary artery disease - status post circumflex stent in the past.Cardiac catheterization in October 2011 demonstrated left anterior  descending 30% to 40% stenosis with other nonobstructive disease with medical therapy recommended.   Has Merlin machine to check pacer at home Hyponatremic on diuretic so stopped   Urologic issues resolved Saw Dr Isabel Caprice last month Needs new nitro Saw SK today and pacer working well   ROS: Denies fever, malais, weight loss, blurry vision, decreased visual acuity, cough, sputum, SOB, hemoptysis, pleuritic pain, palpitaitons, heartburn, abdominal pain, melena, lower extremity edema, claudication, or rash.  All other systems reviewed and negative  General: Affect appropriate Healthy:  appears stated age HEENT: normal Neck supple with no adenopathy JVP normal no bruits no thyromegaly Lungs clear with no wheezing and good diaphragmatic motion Heart:  S1/S2 no murmur, no rub, gallop or click PMI normal Abdomen: benighn, BS positve, no tenderness, no AAA no bruit.  No HSM or HJR Distal pulses intact with no bruits No edema Neuro non-focal Skin warm and dry No muscular weakness   Current Outpatient Prescriptions  Medication Sig Dispense Refill  . aspirin 81 MG tablet Take 81 mg by mouth daily.        Marland Kitchen atenolol (TENORMIN) 50 MG tablet TAKE ONE TABLET BY MOUTH EVERY DAY  90 tablet  1  . glucosamine-chondroitin 500-400 MG tablet Take 1 tablet by mouth daily.        Marland Kitchen losartan (COZAAR) 100 MG tablet Take 100 mg by mouth daily.      . Multiple Vitamin (MULTIVITAMIN) capsule Take 1 capsule by mouth daily.        Marland Kitchen NITROSTAT 0.4 MG SL tablet  DISSOLVE ONE TABLET UNDER THE TONGUE EVERY 5 MINUTES AS NEEDED FOR CHEST PAIN.  DO NOT EXCEED A TOTAL OF 3 DOSES IN 15 MINUTES  25 tablet  11  . NON FORMULARY CPAP Machine at night      . Omega-3 Fatty Acids (FISH OIL) 1200 MG CAPS Take 1 capsule by mouth daily.        . SF 5000 PLUS 1.1 % CREA dental cream as needed.       . thiamine 100 MG tablet Take 1 tablet (100 mg total) by mouth daily.  30 tablet  0   No current facility-administered medications for this visit.    Allergies  Review of patient's allergies indicates no known allergies.  Electrocardiogram:  NSR rate 60 LBBB cannot see v pacing spikes   Assessment and Plan

## 2013-08-06 NOTE — Assessment & Plan Note (Signed)
Sable

## 2013-08-06 NOTE — Progress Notes (Signed)
Patient Care Team: Hoyle Sauer, MD as PCP - General (Internal Medicine) Samul Dada, MD (Hematology and Oncology)   HPI  [name] is a 72 y.o. male Seen in followup for a history of complete heart block status post pacemaker 2011  He has a history of prior catheter ablation for atrial flutter. If coronary artery disease with prior stenting.  Cardiac catheterization in October 2011 demonstrated left anterior  descending 30% to 40% stenosis with other nonobstructive disease  with medical therapy recommended.  He sees Dr PN later today.  No significant shortness of breath or chest pain. He does describe an accident on his fishing boat where he hurt his ribs.   Past Medical History  Diagnosis Date  . CAD (coronary artery disease)     A.  s/p CFX in the past;   B.  cath 06/2010: LAD 30-40%, D1 50%, OM1 50%, AVCFX stent 30%, dCFX 70-80% (med Rx), mRCA 40%;      C.  Echo 06/2010: EF 60-65%, mod LVH; mild LAE     . Heart block   . 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 Hypertension   . Multiple myeloma   . Sleep apnea   . Degenerative disc disease   . Venous insufficiency   . Cubital tunnel syndrome   . Atrial flutter     A. s/p prior ablation;  B.  s/p CTI ablation 10/24/10 (Dr. Graciela Husbands)    Past Surgical History  Procedure Laterality Date  . Coronary angioplasty with stent placement  2005  . Ventral hernia repair    . Right olecranon nerve surgery      Current Outpatient Prescriptions  Medication Sig Dispense Refill  . aspirin 81 MG tablet Take 81 mg by mouth daily.        Marland Kitchen atenolol (TENORMIN) 50 MG tablet TAKE ONE TABLET BY MOUTH EVERY DAY  90 tablet  1  . glucosamine-chondroitin 500-400 MG tablet Take 1 tablet by mouth daily.        Marland Kitchen losartan (COZAAR) 100 MG tablet Take 100 mg by mouth daily.      . Multiple Vitamin (MULTIVITAMIN) capsule Take 1 capsule by mouth daily.        Marland Kitchen NITROSTAT 0.4 MG SL tablet DISSOLVE ONE TABLET  UNDER THE TONGUE EVERY 5 MINUTES AS NEEDED FOR CHEST PAIN.  DO NOT EXCEED A TOTAL OF 3 DOSES IN 15 MINUTES  25 tablet  11  . NON FORMULARY CPAP Machine at night      . Omega-3 Fatty Acids (FISH OIL) 1200 MG CAPS Take 1 capsule by mouth daily.        . SF 5000 PLUS 1.1 % CREA dental cream as needed.       . thiamine 100 MG tablet Take 1 tablet (100 mg total) by mouth daily.  30 tablet  0   No current facility-administered medications for this visit.    No Known Allergies  Review of Systems negative except from HPI and PMH  Physical Exam BP 141/81  Pulse 62  Ht 6' (1.829 m)  Wt 216 lb 6.4 oz (98.158 kg)  BMI 29.34 kg/m2 Well developed and well nourished in no acute distress HENT normal E scleral and icterus clear Neck Supple JVP flat; carotids brisk and full Clear to ausculation Device pocket well healed; without hematoma or erythema.  There is no tethering Regular rate and rhythm, no murmurs gallops or rub Soft with  active bowel sounds No clubbing cyanosis none Edema Alert and oriented, grossly normal motor and sensory function Skin Warm and Dry  ECG AV pacing  Assessment and  Plan

## 2013-08-06 NOTE — Assessment & Plan Note (Signed)
Well controlled.  Continue current medications and low sodium Dash type diet.    

## 2013-09-25 ENCOUNTER — Ambulatory Visit: Payer: Medicare HMO

## 2013-09-25 ENCOUNTER — Other Ambulatory Visit (HOSPITAL_BASED_OUTPATIENT_CLINIC_OR_DEPARTMENT_OTHER): Payer: Medicare HMO

## 2013-09-25 DIAGNOSIS — C9000 Multiple myeloma not having achieved remission: Secondary | ICD-10-CM

## 2013-09-25 LAB — CBC WITH DIFFERENTIAL/PLATELET
BASO%: 0.9 % (ref 0.0–2.0)
Basophils Absolute: 0.1 10*3/uL (ref 0.0–0.1)
EOS ABS: 0.3 10*3/uL (ref 0.0–0.5)
EOS%: 5.7 % (ref 0.0–7.0)
HCT: 38.6 % (ref 38.4–49.9)
HGB: 13.3 g/dL (ref 13.0–17.1)
LYMPH%: 18.6 % (ref 14.0–49.0)
MCH: 33.2 pg (ref 27.2–33.4)
MCHC: 34.5 g/dL (ref 32.0–36.0)
MCV: 96.3 fL (ref 79.3–98.0)
MONO#: 0.6 10*3/uL (ref 0.1–0.9)
MONO%: 11 % (ref 0.0–14.0)
NEUT%: 63.8 % (ref 39.0–75.0)
NEUTROS ABS: 3.5 10*3/uL (ref 1.5–6.5)
Platelets: 195 10*3/uL (ref 140–400)
RBC: 4.01 10*6/uL — AB (ref 4.20–5.82)
RDW: 11.9 % (ref 11.0–14.6)
WBC: 5.5 10*3/uL (ref 4.0–10.3)
lymph#: 1 10*3/uL (ref 0.9–3.3)
nRBC: 0 % (ref 0–0)

## 2013-09-25 LAB — COMPREHENSIVE METABOLIC PANEL (CC13)
ALT: 39 U/L (ref 0–55)
ANION GAP: 13 meq/L — AB (ref 3–11)
AST: 43 U/L — ABNORMAL HIGH (ref 5–34)
Albumin: 3.7 g/dL (ref 3.5–5.0)
Alkaline Phosphatase: 74 U/L (ref 40–150)
BILIRUBIN TOTAL: 0.92 mg/dL (ref 0.20–1.20)
BUN: 7.2 mg/dL (ref 7.0–26.0)
CO2: 24 mEq/L (ref 22–29)
CREATININE: 0.8 mg/dL (ref 0.7–1.3)
Calcium: 9.5 mg/dL (ref 8.4–10.4)
Chloride: 99 mEq/L (ref 98–109)
GLUCOSE: 94 mg/dL (ref 70–140)
Potassium: 4.3 mEq/L (ref 3.5–5.1)
Sodium: 136 mEq/L (ref 136–145)
Total Protein: 8.7 g/dL — ABNORMAL HIGH (ref 6.4–8.3)

## 2013-09-26 ENCOUNTER — Other Ambulatory Visit: Payer: Self-pay | Admitting: Internal Medicine

## 2013-09-26 ENCOUNTER — Telehealth: Payer: Self-pay | Admitting: Internal Medicine

## 2013-09-26 DIAGNOSIS — C9000 Multiple myeloma not having achieved remission: Secondary | ICD-10-CM

## 2013-09-26 LAB — KAPPA/LAMBDA LIGHT CHAINS
KAPPA FREE LGHT CHN: 1.09 mg/dL (ref 0.33–1.94)
Kappa:Lambda Ratio: 0.17 — ABNORMAL LOW (ref 0.26–1.65)
LAMBDA FREE LGHT CHN: 6.52 mg/dL — AB (ref 0.57–2.63)

## 2013-09-26 LAB — IGG, IGA, IGM
IGA: 99 mg/dL (ref 68–379)
IGG (IMMUNOGLOBIN G), SERUM: 365 mg/dL — AB (ref 650–1600)
IGM, SERUM: 3890 mg/dL — AB (ref 41–251)

## 2013-09-26 NOTE — Telephone Encounter (Signed)
Patient's labs reviewed over the phone indicating stability in his multiple myeloma markers, a normal CBC.  We will await [facility] send out.  Instructed him to follow up in three months.

## 2013-09-30 ENCOUNTER — Telehealth: Payer: Self-pay | Admitting: Internal Medicine

## 2013-09-30 NOTE — Telephone Encounter (Signed)
s/w re appt for lb/fu 4/16 @ 8:30am - time per pt.

## 2013-11-07 ENCOUNTER — Ambulatory Visit (INDEPENDENT_AMBULATORY_CARE_PROVIDER_SITE_OTHER): Payer: Medicare HMO | Admitting: *Deleted

## 2013-11-07 DIAGNOSIS — I442 Atrioventricular block, complete: Secondary | ICD-10-CM

## 2013-11-07 DIAGNOSIS — I498 Other specified cardiac arrhythmias: Secondary | ICD-10-CM

## 2013-11-07 DIAGNOSIS — I495 Sick sinus syndrome: Secondary | ICD-10-CM

## 2013-11-07 DIAGNOSIS — I471 Supraventricular tachycardia: Secondary | ICD-10-CM

## 2013-11-11 LAB — MDC_IDC_ENUM_SESS_TYPE_REMOTE
Battery Remaining Longevity: 102 mo
Brady Statistic AP VP Percent: 69 %
Brady Statistic AP VS Percent: 1 %
Brady Statistic AS VP Percent: 31 %
Brady Statistic AS VS Percent: 1 %
Brady Statistic RA Percent Paced: 69 %
Brady Statistic RV Percent Paced: 99 %
Date Time Interrogation Session: 20150226151858
Implantable Pulse Generator Model: 2110
Implantable Pulse Generator Serial Number: 7163077
Lead Channel Impedance Value: 450 Ohm
Lead Channel Pacing Threshold Amplitude: 0.625 V
Lead Channel Pacing Threshold Amplitude: 0.75 V
Lead Channel Pacing Threshold Pulse Width: 0.3 ms
Lead Channel Pacing Threshold Pulse Width: 0.4 ms
Lead Channel Sensing Intrinsic Amplitude: 12 mV
Lead Channel Sensing Intrinsic Amplitude: 2.3 mV
Lead Channel Setting Pacing Amplitude: 1 V
Lead Channel Setting Pacing Pulse Width: 0.4 ms
Lead Channel Setting Sensing Sensitivity: 2 mV
MDC IDC MSMT BATTERY VOLTAGE: 2.95 V
MDC IDC MSMT LEADCHNL RA IMPEDANCE VALUE: 440 Ohm
MDC IDC SET LEADCHNL RA PACING AMPLITUDE: 1.625

## 2013-11-20 ENCOUNTER — Other Ambulatory Visit: Payer: Self-pay | Admitting: Cardiovascular Disease

## 2013-12-02 ENCOUNTER — Encounter: Payer: Self-pay | Admitting: *Deleted

## 2013-12-10 ENCOUNTER — Encounter: Payer: Self-pay | Admitting: Internal Medicine

## 2013-12-26 ENCOUNTER — Telehealth: Payer: Self-pay | Admitting: Medical Oncology

## 2013-12-26 ENCOUNTER — Telehealth: Payer: Self-pay | Admitting: Internal Medicine

## 2013-12-26 ENCOUNTER — Other Ambulatory Visit (HOSPITAL_BASED_OUTPATIENT_CLINIC_OR_DEPARTMENT_OTHER): Payer: Medicare HMO

## 2013-12-26 ENCOUNTER — Ambulatory Visit (HOSPITAL_BASED_OUTPATIENT_CLINIC_OR_DEPARTMENT_OTHER): Payer: Medicare HMO | Admitting: Internal Medicine

## 2013-12-26 VITALS — BP 145/78 | HR 59 | Temp 97.0°F | Resp 18 | Ht 72.0 in | Wt 233.4 lb

## 2013-12-26 DIAGNOSIS — C9000 Multiple myeloma not having achieved remission: Secondary | ICD-10-CM

## 2013-12-26 DIAGNOSIS — D472 Monoclonal gammopathy: Secondary | ICD-10-CM

## 2013-12-26 LAB — COMPREHENSIVE METABOLIC PANEL (CC13)
ALBUMIN: 3.9 g/dL (ref 3.5–5.0)
ALT: 61 U/L — AB (ref 0–55)
ANION GAP: 10 meq/L (ref 3–11)
AST: 67 U/L — ABNORMAL HIGH (ref 5–34)
Alkaline Phosphatase: 81 U/L (ref 40–150)
BUN: 6.5 mg/dL — ABNORMAL LOW (ref 7.0–26.0)
CALCIUM: 9.6 mg/dL (ref 8.4–10.4)
CHLORIDE: 98 meq/L (ref 98–109)
CO2: 27 meq/L (ref 22–29)
Creatinine: 0.7 mg/dL (ref 0.7–1.3)
Glucose: 111 mg/dl (ref 70–140)
POTASSIUM: 4.1 meq/L (ref 3.5–5.1)
Sodium: 134 mEq/L — ABNORMAL LOW (ref 136–145)
TOTAL PROTEIN: 8.9 g/dL — AB (ref 6.4–8.3)
Total Bilirubin: 0.91 mg/dL (ref 0.20–1.20)

## 2013-12-26 LAB — CBC WITH DIFFERENTIAL/PLATELET
BASO%: 0.5 % (ref 0.0–2.0)
Basophils Absolute: 0 10*3/uL (ref 0.0–0.1)
EOS%: 7.2 % — ABNORMAL HIGH (ref 0.0–7.0)
Eosinophils Absolute: 0.3 10*3/uL (ref 0.0–0.5)
HEMATOCRIT: 39 % (ref 38.4–49.9)
HGB: 14.1 g/dL (ref 13.0–17.1)
LYMPH#: 0.9 10*3/uL (ref 0.9–3.3)
LYMPH%: 18.6 % (ref 14.0–49.0)
MCH: 35.4 pg — ABNORMAL HIGH (ref 27.2–33.4)
MCHC: 36.2 g/dL — AB (ref 32.0–36.0)
MCV: 97.8 fL (ref 79.3–98.0)
MONO#: 0.6 10*3/uL (ref 0.1–0.9)
MONO%: 12.1 % (ref 0.0–14.0)
NEUT#: 2.9 10*3/uL (ref 1.5–6.5)
NEUT%: 61.6 % (ref 39.0–75.0)
Platelets: 205 10*3/uL (ref 140–400)
RBC: 3.99 10*6/uL — ABNORMAL LOW (ref 4.20–5.82)
RDW: 12.2 % (ref 11.0–14.6)
WBC: 4.7 10*3/uL (ref 4.0–10.3)

## 2013-12-26 LAB — LACTATE DEHYDROGENASE (CC13): LDH: 214 U/L (ref 125–245)

## 2013-12-26 NOTE — Patient Instructions (Signed)
Atorvastatin tablets What is this medicine? ATORVASTATIN (a TORE va sta tin) is known as a HMG-CoA reductase inhibitor or 'statin'. It lowers the level of cholesterol and triglycerides in the blood. This drug may also reduce the risk of heart attack, stroke, or other health problems in patients with risk factors for heart disease. Diet and lifestyle changes are often used with this drug. This medicine may be used for other purposes; ask your health care provider or pharmacist if you have questions. COMMON BRAND NAME(S): Lipitor What should I tell my health care provider before I take this medicine? They need to know if you have any of these conditions: -frequently drink alcoholic beverages -history of stroke, TIA -kidney disease -liver disease -muscle aches or weakness -other medical condition -an unusual or allergic reaction to atorvastatin, other medicines, foods, dyes, or preservatives -pregnant or trying to get pregnant -breast-feeding How should I use this medicine? Take this medicine by mouth with a glass of water. Follow the directions on the prescription label. You can take this medicine with or without food. Take your doses at regular intervals. Do not take your medicine more often than directed. Talk to your pediatrician regarding the use of this medicine in children. While this drug may be prescribed for children as young as 44 years old for selected conditions, precautions do apply. Overdosage: If you think you have taken too much of this medicine contact a poison control center or emergency room at once. NOTE: This medicine is only for you. Do not share this medicine with others. What if I miss a dose? If you miss a dose, take it as soon as you can. If it is almost time for your next dose, take only that dose. Do not take double or extra doses. What may interact with this medicine? Do not take this medicine with any of the following medications: -red yeast  rice -telaprevir -telithromycin -voriconazole This medicine may also interact with the following medications: -alcohol -antiviral medicines for HIV or AIDS -boceprevir -certain antibiotics like clarithromycin, erythromycin, troleandomycin -certain medicines for cholesterol like fenofibrate or gemfibrozil -cimetidine -clarithromycin -colchicine -cyclosporine -digoxin -male hormones, like estrogens or progestins and birth control pills -grapefruit juice -medicines for fungal infections like fluconazole, itraconazole, ketoconazole -niacin -rifampin -spironolactone This list may not describe all possible interactions. Give your health care provider a list of all the medicines, herbs, non-prescription drugs, or dietary supplements you use. Also tell them if you smoke, drink alcohol, or use illegal drugs. Some items may interact with your medicine. What should I watch for while using this medicine? Visit your doctor or health care professional for regular check-ups. You may need regular tests to make sure your liver is working properly. Tell your doctor or health care professional right away if you get any unexplained muscle pain, tenderness, or weakness, especially if you also have a fever and tiredness. Your doctor or health care professional may tell you to stop taking this medicine if you develop muscle problems. If your muscle problems do not go away after stopping this medicine, contact your health care professional. This drug is only part of a total heart-health program. Your doctor or a dietician can suggest a low-cholesterol and low-fat diet to help. Avoid alcohol and smoking, and keep a proper exercise schedule. Do not use this drug if you are pregnant or breast-feeding. Serious side effects to an unborn child or to an infant are possible. Talk to your doctor or pharmacist for more information. This medicine may affect  blood sugar levels. If you have diabetes, check with your doctor  or health care professional before you change your diet or the dose of your diabetic medicine. If you are going to have surgery tell your health care professional that you are taking this drug. What side effects may I notice from receiving this medicine? Side effects that you should report to your doctor or health care professional as soon as possible: -allergic reactions like skin rash, itching or hives, swelling of the face, lips, or tongue -dark urine -fever -joint pain -muscle cramps, pain -redness, blistering, peeling or loosening of the skin, including inside the mouth -trouble passing urine or change in the amount of urine -unusually weak or tired -yellowing of eyes or skin Side effects that usually do not require medical attention (report to your doctor or health care professional if they continue or are bothersome): -constipation -heartburn -stomach gas, pain, upset This list may not describe all possible side effects. Call your doctor for medical advice about side effects. You may report side effects to FDA at 1-800-FDA-1088. Where should I keep my medicine? Keep out of the reach of children. Store at room temperature between 20 to 25 degrees C (68 to 77 degrees F). Throw away any unused medicine after the expiration date. NOTE: This sheet is a summary. It may not cover all possible information. If you have questions about this medicine, talk to your doctor, pharmacist, or health care provider.  2014, Elsevier/Gold Standard. (2011-07-19 95:62:13) Fatty Liver Fatty liver is the accumulation of fat in liver cells. It is also called hepatosteatosis or steatohepatitis. It is normal for your liver to contain some fat. If fat is more than 5 to 10% of your liver's weight, you have fatty liver.  There are often no symptoms (problems) for years while damage is still occurring. People often learn about their fatty liver when they have medical tests for other reasons. Fat can damage your  liver for years or even decades without causing problems. When it becomes severe, it can cause fatigue, weight loss, weakness, and confusion. This makes you more likely to develop more serious liver problems. The liver is the largest organ in the body. It does a lot of work and often gives no warning signs when it is sick until late in a disease. The liver has many important jobs including:  Breaking down foods.  Storing vitamins, iron, and other minerals.  Making proteins.  Making bile for food digestion.  Breaking down many products including medications, alcohol and some poisons. CAUSES  There are a number of different conditions, medications, and poisons that can cause a fatty liver. Eating too many calories causes fat to build up in the liver. Not processing and breaking fats down normally may also cause this. Certain conditions, such as obesity, diabetes, and high triglycerides also cause this. Most fatty liver patients tend to be middle-aged and over weight.  Some causes of fatty liver are:  Alcohol over consumption.  Malnutrition.  Steroid use.  Valproic acid toxicity.  Obesity.  Cushing's syndrome.  Poisons.  Tetracycline in high dosages.  Pregnancy.  Diabetes.  Hyperlipidemia.  Rapid weight loss. Some people develop fatty liver even having none of these conditions. SYMPTOMS  Fatty liver most often causes no problems. This is called asymptomatic.  It can be diagnosed with blood tests and also by a liver biopsy.  It is one of the most common causes of minor elevations of liver enzymes on routine blood tests.  Specialized  Imaging of the liver using ultrasound, CT (computed tomography) scan, or MRI (magnetic resonance imaging) can suggest a fatty liver but a biopsy is needed to confirm it.  A biopsy involves taking a small sample of liver tissue. This is done by using a needle. It is then looked at under a microscope by a specialist. TREATMENT  It is  important to treat the cause. Simple fatty liver without a medical reason may not need treatment.  Weight loss, fat restriction, and exercise in overweight patients produces inconsistent results but is worth trying.  Fatty liver due to alcohol toxicity may not improve even with stopping drinking.  Good control of diabetes may reduce fatty liver.  Lower your triglycerides through diet, medication or both.  Eat a balanced, healthy diet.  Increase your physical activity.  Get regular checkups from a liver specialist.  There are no medical or surgical treatments for a fatty liver or NASH, but improving your diet and increasing your exercise may help prevent or reverse some of the damage. PROGNOSIS  Fatty liver may cause no damage or it can lead to an inflammation of the liver. This is, called steatohepatitis. When it is linked to alcohol abuse, it is called alcoholic steatohepatitis. It often is not linked to alcohol. It is then called nonalcoholic steatohepatitis, or NASH. Over time the liver may become scarred and hardened. This condition is called cirrhosis. Cirrhosis is serious and may lead to liver failure or cancer. NASH is one of the leading causes of cirrhosis. About 10-20% of Americans have fatty liver and a smaller 2-5% has NASH. Document Released: 10/14/2005 Document Revised: 11/21/2011 Document Reviewed: 12/07/2005 [facility] Patient Information 2014 [place].

## 2013-12-26 NOTE — Telephone Encounter (Signed)
Pt called and states when he was here this am Dr.Chism was concerned about his weight gain. He was surprised that he weighed 233. He was going to reweigh before he left our office but forgot it. When he got home he weighed and he was 220 and this is what he has been weighing. He is not sure if the data was loaded incorrectly but he would like for me let Dr. Juliann Mule know. He also asked if we could call him when we get the protein studies back. I told him we will be happy to call him.

## 2013-12-26 NOTE — Progress Notes (Signed)
[facility] OFFICE PROGRESS NOTE  Tivis Ringer, MD [place], [place]. 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 male with a history as noted above presents for followup appointment. He was last seen by me on 06/06/2013. He reports feeling well overall.  He states that he pulled his back a little bit.  He has been doing some outside work activity since last week.  He does stretching to help with this. He denies any recent falls.  He saw his primary care physician Dr. Berneta Sages in March of 2015. He had some changes in his medications and has a follow-up annually this December.  He had a CXR in December of last year which was reportedly negative.  He sees Dr. Johnsie Cancel in November and continues to follow his pacemaker every 90 days and bi-annual follow-ups.  He denies chest pain or shortness of breath. He does have some dyspnea on moderate exertion. He has nitroglycerin but has not required its use in 6 months. Patient otherwise has no major changes since his last visit. He has not had any recent hospitalizations or emergency room visits. He has gained nearly 20 lbs since his visit. He started statin therapy in December 2014.  He denies yellowing of the eyes and pruritus.  He denies abdominal pain.    MEDICAL HISTORY: Past Medical History  Diagnosis Date  . CAD (coronary artery disease)     A.  s/p CFX in the past;   B.  cath 06/2010: LAD 30-40%, D1 50%, OM1 50%, AVCFX stent 30%, dCFX 70-80% (med Rx), mRCA 40%;      C.  Echo 06/2010: EF 60-65%, mod LVH; mild LAE     . Heart block   . 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 Hypertension   . Multiple myeloma   . Sleep apnea   . Degenerative disc disease   . Venous insufficiency   . Cubital tunnel  syndrome   . Atrial flutter     A. s/p prior ablation;  B.  s/p CTI ablation 10/24/10 (Dr. Caryl Comes)    INTERIM HISTORY: has MULTIPLE  MYELOMA; SMOKER; OBSTRUCTIVE SLEEP APNEA; HYPERTENSION; Atrioventricular block, heart high-grade; Atrial tachycardia/fibrillation; SICK SINUS/ TACHY-BRADY SYNDROME; VENOUS INSUFFICIENCY; SCIATICA; SHORTNESS OF BREATH; ALLERGY; PACEMAKER, [employer] 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    ALLERGIES:  has No Known Allergies.  MEDICATIONS: has a current medication list which includes the following prescription(s): aspirin, atenolol, atorvastatin, glucosamine hcl, losartan, multivitamin, mupirocin cream, nitrostat, NON FORMULARY, fish oil, sf 5000 plus, and thiamine.  SURGICAL HISTORY:  Past Surgical History  Procedure Laterality Date  . Coronary angioplasty with stent placement  2005  . Ventral hernia repair    . Right olecranon nerve surgery      REVIEW OF SYSTEMS:   Constitutional: Denies fevers, chills or abnormal weight loss Eyes: Denies blurriness of vision Ears, nose, mouth, throat, and face: Denies mucositis or sore throat Respiratory: Denies cough, dyspnea or wheezes Cardiovascular: Denies palpitation, chest discomfort or lower extremity swelling Gastrointestinal:  Denies nausea, heartburn or change in bowel habits Skin: Denies abnormal skin rashes Lymphatics: Denies new lymphadenopathy or easy bruising Neurological:Denies numbness, tingling or new  weaknesses Behavioral/Psych: Mood is stable, no new changes  All other systems were reviewed with the patient and are negative.  PHYSICAL EXAMINATION: ECOG PERFORMANCE STATUS: 0 - Asymptomatic  Blood pressure 145/78, pulse 59, temperature 97 F (36.1 C), temperature source Oral, resp. rate 18, height 6' (1.829 m),  weight 233 lb 6.4 oz (105.87 kg).  GENERAL:alert, no distress and comfortable, pleasant elderly male who appears his stated age. HEAD: Normal cephalic atraumatic; no bruising identified. SKIN: skin color, texture, turgor are normal, no rashes or significant lesions EYES: normal, Conjunctiva are pink and non-injected, sclera clear OROPHARYNX:no exudate, no erythema and lips, buccal mucosa, and tongue normal  NECK: supple, thyroid normal size, non-tender, without nodularity LYMPH:  no palpable lymphadenopathy in the cervical, axillary or supraclavicular LUNGS: clear to auscultation and percussion with normal breathing effort HEART: regular rate & rhythm and no murmurs and no lower extremity edema ABDOMEN:abdomen soft, non-tender and normal bowel sounds Musculoskeletal:no cyanosis of digits and no clubbing  NEURO: alert & oriented x 3 with fluent speech, no focal motor/sensory deficits   LABORATORY DATA: Results for orders placed in visit on 12/26/13 (from the past 48 hour(s))  CBC WITH DIFFERENTIAL     Status: Abnormal   Collection Time    12/26/13  8:22 AM      Result Value Ref Range   WBC 4.7  4.0 - 10.3 10e3/uL   NEUT# 2.9  1.5 - 6.5 10e3/uL   HGB 14.1  13.0 - 17.1 g/dL   HCT 39.0  38.4 - 49.9 %   Platelets 205  140 - 400 10e3/uL   MCV 97.8  79.3 - 98.0 fL   MCH 35.4 (*) 27.2 - 33.4 pg   MCHC 36.2 (*) 32.0 - 36.0 g/dL   RBC 3.99 (*) 4.20 - 5.82 10e6/uL   RDW 12.2  11.0 - 14.6 %   lymph# 0.9  0.9 - 3.3 10e3/uL   MONO# 0.6  0.1 - 0.9 10e3/uL   Eosinophils Absolute 0.3  0.0 - 0.5 10e3/uL   Basophils Absolute 0.0  0.0 - 0.1 10e3/uL   NEUT% 61.6  39.0 - 75.0 %   LYMPH% 18.6  14.0 - 49.0 %   MONO% 12.1  0.0 - 14.0 %   EOS% 7.2 (*) 0.0 - 7.0 %   BASO% 0.5  0.0 - 2.0 %  COMPREHENSIVE METABOLIC PANEL (FU93)     Status: Abnormal   Collection Time    12/26/13  8:23 AM      Result Value Ref Range   Sodium 134 (*) 136 - 145 mEq/L   Potassium 4.1  3.5 - 5.1 mEq/L   Chloride 98  98 -  109 mEq/L   CO2 27  22 - 29 mEq/L   Glucose 111  70 - 140 mg/dl   BUN 6.5 (*) 7.0 - 26.0 mg/dL   Creatinine 0.7  0.7 - 1.3 mg/dL   Total Bilirubin 0.91  0.20 - 1.20 mg/dL   Alkaline Phosphatase 81  40 - 150 U/L   AST 67 (*) 5 - 34 U/L   ALT 61 (*) 0 - 55 U/L   Total Protein 8.9 (*) 6.4 - 8.3 g/dL   Albumin 3.9  3.5 - 5.0 g/dL   Calcium 9.6  8.4 - 10.4 mg/dL   Anion Gap 10  3 - 11 mEq/L    Results for RODEL, GLASPY (MRN [id number]) as of 12/26/2013 09:19  Ref. Range 09/25/2013 08:50  IgG (Immunoglobin G), Serum Latest Range: [phone]  mg/dL 365 (L)  IgA Latest Range: 68-379 mg/dL 99  IgM, Serum Latest Range: 41-251 mg/dL 3890 (H)  Kappa free light chain Latest Range: 0.33-1.94 mg/dL 1.09  Lambda Free Lght Chn Latest Range: 0.57-2.63 mg/dL 6.52 (H)  Kappa:Lambda Ratio Latest Range: 0.26-1.65  0.17 (L)    RADIOGRAPHIC STUDIES: No results found.  ASSESSMENT: 1. 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 2 multiple falls in the elderly.3. Weight gain. 4. Elevated transiminases   PLAN:  --Mr. Purkey continues to do fairly well with no evidence for progressive disease. His labs from today are pending. He has a plasma cell dyscrasia characterized by significantly elevated IgM level and IgM lambda monoclonal gammopathy as well as a low IgG level return to our clinic in 6 months at which time we will check a CBC, chemistries and quantitative immunoglobulins. --Patient was counseled extensively on fall prevention in the elderly and fall precautions. He was advised to take a multivitamin and calcium and vitamin D. He was given a handout about falls in the elderly. --He started atorvastatin in December and he has gained weight (fatty liver) with mildly elevated liver enzymes.  We will repeat liver enzymes and it remain elevated, he will discuss with cardiology and PCP for alternatives.  He was counseled extensively on weight loss.   All questions were answered. The patient knows to  call the clinic with any problems, questions or concerns. We can certainly see the patient much sooner if necessary. He was provided an after visit summary of today's visit.   I spent 15 minutes counseling the patient face to face. The total time spent in the appointment was 25 minutes.    Concha Norway, MD 12/26/2013 9:19 AM

## 2013-12-26 NOTE — Telephone Encounter (Signed)
gv and printed aptps ched and avs forop t for June and OCT

## 2013-12-30 LAB — KAPPA/LAMBDA LIGHT CHAINS
KAPPA LAMBDA RATIO: 0.22 — AB (ref 0.26–1.65)
Kappa free light chain: 1.24 mg/dL (ref 0.33–1.94)
Lambda Free Lght Chn: 5.76 mg/dL — ABNORMAL HIGH (ref 0.57–2.63)

## 2013-12-30 LAB — SPEP & IFE WITH QIG
ALBUMIN ELP: 47.7 % — AB (ref 55.8–66.1)
ALPHA-1-GLOBULIN: 2.9 % (ref 2.9–4.9)
ALPHA-2-GLOBULIN: 7.8 % (ref 7.1–11.8)
Beta 2: 32.4 % — ABNORMAL HIGH (ref 3.2–6.5)
Beta Globulin: 4.4 % — ABNORMAL LOW (ref 4.7–7.2)
Gamma Globulin: 4.8 % — ABNORMAL LOW (ref 11.1–18.8)
IgA: 102 mg/dL (ref 68–379)
IgG (Immunoglobin G), Serum: 346 mg/dL — ABNORMAL LOW (ref 650–1600)
IgM, Serum: 3820 mg/dL — ABNORMAL HIGH (ref 41–251)
M-Spike, %: 2.51 g/dL
TOTAL PROTEIN, SERUM ELECTROPHOR: 8.5 g/dL — AB (ref 6.0–8.3)

## 2014-02-04 ENCOUNTER — Ambulatory Visit (INDEPENDENT_AMBULATORY_CARE_PROVIDER_SITE_OTHER): Payer: Medicare HMO | Admitting: Cardiovascular Disease

## 2014-02-04 VITALS — BP 148/82 | HR 84 | Ht 72.0 in | Wt 222.8 lb

## 2014-02-04 DIAGNOSIS — I10 Essential (primary) hypertension: Secondary | ICD-10-CM

## 2014-02-04 DIAGNOSIS — I495 Sick sinus syndrome: Secondary | ICD-10-CM

## 2014-02-04 DIAGNOSIS — C9000 Multiple myeloma not having achieved remission: Secondary | ICD-10-CM

## 2014-02-04 NOTE — Assessment & Plan Note (Signed)
Well controlled.  Continue current medications and low sodium Dash type diet.    

## 2014-02-04 NOTE — Assessment & Plan Note (Signed)
F/U with oncology counts stable and no new bone lesions

## 2014-02-04 NOTE — Progress Notes (Signed)
Patient ID: [name], male   DOB: 11-Jun-1941, 73 y.o.   MRN: [id number] [name] is a 73 y.o. male Seen in followup for atrial flutter status post ablation. He also has a history of complete heart block status post pacemaker. He is without significant symptoms of chest pain shortness of breath or edema.  He has coronary artery disease - status post circumflex stent in the past.Cardiac catheterization in October 2011 demonstrated left anterior  descending 30% to 40% stenosis with other nonobstructive disease with medical therapy recommended.  Has Merlin machine to check pacer at home Hyponatremic on diuretic so stopped  Urologic issues resolved Saw Dr Risa Grill last month Needs new nitro   Has a plasma cell dyscrasia.  Minor change in LFTls  AST 46  No need to stop statin      ROS: Denies fever, malais, weight loss, blurry vision, decreased visual acuity, cough, sputum, SOB, hemoptysis, pleuritic pain, palpitaitons, heartburn, abdominal pain, melena, lower extremity edema, claudication, or rash.  All other systems reviewed and negative  General: Affect appropriate Healthy:  appears stated age 34: normal Neck supple with no adenopathy JVP normal no bruits no thyromegaly Lungs clear with no wheezing and good diaphragmatic motion Heart:  S1/S2 no murmur, no rub, gallop or click  Pacer under left clavicle  PMI normal Abdomen: benighn, BS positve, no tenderness, no AAA no bruit.  No HSM or HJR Distal pulses intact with no bruits No edema Neuro non-focal Skin warm and dry No muscular weakness   Current Outpatient Prescriptions  Medication Sig Dispense Refill  . aspirin 81 MG tablet Take 81 mg by mouth daily.        Marland Kitchen atenolol (TENORMIN) 50 MG tablet TAKE ONE TABLET BY MOUTH ONCE DAILY  90 tablet  0  . atorvastatin (LIPITOR) 10 MG tablet Take 10 mg by mouth daily.      . Glucosamine HCl 1000 MG TABS Take 2 tablets by mouth daily.      Marland Kitchen losartan (COZAAR) 100 MG tablet Take  100 mg by mouth daily.      . Multiple Vitamin (MULTIVITAMIN) capsule Take 1 capsule by mouth daily.        . mupirocin cream (BACTROBAN) 2 % Apply 1 application topically daily. To scalp      . NITROSTAT 0.4 MG SL tablet DISSOLVE ONE TABLET UNDER THE TONGUE EVERY 5 MINUTES AS NEEDED FOR CHEST PAIN.  DO NOT EXCEED A TOTAL OF 3 DOSES IN 15 MINUTES  25 tablet  11  . NON FORMULARY CPAP Machine at night      . Omega-3 Fatty Acids (FISH OIL) 1200 MG CAPS Take 1 capsule by mouth daily.        . SF 5000 PLUS 1.1 % CREA dental cream as needed. For teeth      . thiamine 100 MG tablet Take 1 tablet (100 mg total) by mouth daily.  30 tablet  0   No current facility-administered medications for this visit.    Allergies  Review of patient's allergies indicates no known allergies.  Electrocardiogram:  Assessment and Plan

## 2014-02-04 NOTE — Assessment & Plan Note (Signed)
Normal pacer function will see if EP can check today as he wants to go out of town when his next [facility] is scheduled

## 2014-02-04 NOTE — Patient Instructions (Signed)
Your physician wants you to follow-up in:  6 MONTHS WITH DR NISHAN  You will receive a reminder letter in the mail two months in advance. If you don't receive a letter, please call our office to schedule the follow-up appointment. Your physician recommends that you continue on your current medications as directed. Please refer to the Current Medication list given to you today. 

## 2014-02-11 ENCOUNTER — Ambulatory Visit (INDEPENDENT_AMBULATORY_CARE_PROVIDER_SITE_OTHER): Payer: Medicare HMO | Admitting: *Deleted

## 2014-02-11 ENCOUNTER — Telehealth: Payer: Self-pay | Admitting: Cardiology

## 2014-02-11 DIAGNOSIS — I495 Sick sinus syndrome: Secondary | ICD-10-CM

## 2014-02-11 LAB — MDC_IDC_ENUM_SESS_TYPE_REMOTE
Battery Remaining Longevity: 102 mo
Battery Voltage: 2.95 V
Brady Statistic AP VP Percent: 74 %
Brady Statistic AP VS Percent: 1 %
Brady Statistic AS VP Percent: 26 %
Brady Statistic AS VS Percent: 1 %
Brady Statistic RA Percent Paced: 73 %
Date Time Interrogation Session: 20150602165046
Lead Channel Impedance Value: 480 Ohm
Lead Channel Pacing Threshold Amplitude: 0.625 V
Lead Channel Pacing Threshold Pulse Width: 0.4 ms
Lead Channel Sensing Intrinsic Amplitude: 12 mV
Lead Channel Sensing Intrinsic Amplitude: 2.7 mV
Lead Channel Setting Pacing Amplitude: 1 V
Lead Channel Setting Pacing Pulse Width: 0.4 ms
MDC IDC MSMT BATTERY REMAINING PERCENTAGE: 74 %
MDC IDC MSMT LEADCHNL RA IMPEDANCE VALUE: 450 Ohm
MDC IDC MSMT LEADCHNL RA PACING THRESHOLD PULSEWIDTH: 0.3 ms
MDC IDC MSMT LEADCHNL RV PACING THRESHOLD AMPLITUDE: 0.75 V
MDC IDC PG SERIAL: 7163077
MDC IDC SET LEADCHNL RA PACING AMPLITUDE: 1.625
MDC IDC SET LEADCHNL RV SENSING SENSITIVITY: 2 mV
MDC IDC STAT BRADY RV PERCENT PACED: 99 %

## 2014-02-11 NOTE — Telephone Encounter (Signed)
Spoke with pt and reminded pt of remote transmission that is due today. Pt verbalized understanding.   

## 2014-02-11 NOTE — Progress Notes (Signed)
Remote pacemaker transmission.   

## 2014-02-19 ENCOUNTER — Other Ambulatory Visit: Payer: Self-pay | Admitting: Cardiovascular Disease

## 2014-02-20 ENCOUNTER — Other Ambulatory Visit (HOSPITAL_BASED_OUTPATIENT_CLINIC_OR_DEPARTMENT_OTHER): Payer: Medicare HMO

## 2014-02-20 DIAGNOSIS — D472 Monoclonal gammopathy: Secondary | ICD-10-CM

## 2014-02-20 DIAGNOSIS — C9000 Multiple myeloma not having achieved remission: Secondary | ICD-10-CM

## 2014-02-20 LAB — COMPREHENSIVE METABOLIC PANEL (CC13)
ALT: 65 U/L — ABNORMAL HIGH (ref 0–55)
AST: 65 U/L — ABNORMAL HIGH (ref 5–34)
Albumin: 3.8 g/dL (ref 3.5–5.0)
Alkaline Phosphatase: 70 U/L (ref 40–150)
Anion Gap: 11 mEq/L (ref 3–11)
BUN: 7.3 mg/dL (ref 7.0–26.0)
CHLORIDE: 98 meq/L (ref 98–109)
CO2: 26 meq/L (ref 22–29)
CREATININE: 0.7 mg/dL (ref 0.7–1.3)
Calcium: 9.6 mg/dL (ref 8.4–10.4)
Glucose: 102 mg/dl (ref 70–140)
Potassium: 4.3 mEq/L (ref 3.5–5.1)
Sodium: 135 mEq/L — ABNORMAL LOW (ref 136–145)
Total Bilirubin: 1.19 mg/dL (ref 0.20–1.20)
Total Protein: 8.7 g/dL — ABNORMAL HIGH (ref 6.4–8.3)

## 2014-02-21 ENCOUNTER — Other Ambulatory Visit: Payer: Self-pay

## 2014-02-21 ENCOUNTER — Emergency Department (HOSPITAL_COMMUNITY)
Admission: EM | Admit: 2014-02-21 | Discharge: 2014-02-21 | Disposition: A | Discharge status: Home / Self Care | Payer: Medicare HMO | Attending: Emergency Medicine | Admitting: Emergency Medicine

## 2014-02-21 ENCOUNTER — Encounter (HOSPITAL_COMMUNITY): Payer: Self-pay | Admitting: Emergency Medicine

## 2014-02-21 ENCOUNTER — Emergency Department (HOSPITAL_COMMUNITY): Payer: Medicare HMO

## 2014-02-21 DIAGNOSIS — Z7982 Long term (current) use of aspirin: Secondary | ICD-10-CM | POA: Insufficient documentation

## 2014-02-21 DIAGNOSIS — W1809XA Striking against other object with subsequent fall, initial encounter: Secondary | ICD-10-CM | POA: Insufficient documentation

## 2014-02-21 DIAGNOSIS — Z87891 Personal history of nicotine dependence: Secondary | ICD-10-CM | POA: Insufficient documentation

## 2014-02-21 DIAGNOSIS — S5000XA Contusion of unspecified elbow, initial encounter: Secondary | ICD-10-CM | POA: Insufficient documentation

## 2014-02-21 DIAGNOSIS — Z87898 Personal history of other specified conditions: Secondary | ICD-10-CM | POA: Insufficient documentation

## 2014-02-21 DIAGNOSIS — I2510 Atherosclerotic heart disease of native coronary artery without angina pectoris: Secondary | ICD-10-CM | POA: Insufficient documentation

## 2014-02-21 DIAGNOSIS — Y9289 Other specified places as the place of occurrence of the external cause: Secondary | ICD-10-CM | POA: Insufficient documentation

## 2014-02-21 DIAGNOSIS — Z8669 Personal history of other diseases of the nervous system and sense organs: Secondary | ICD-10-CM | POA: Insufficient documentation

## 2014-02-21 DIAGNOSIS — Z9861 Coronary angioplasty status: Secondary | ICD-10-CM | POA: Insufficient documentation

## 2014-02-21 DIAGNOSIS — Z79899 Other long term (current) drug therapy: Secondary | ICD-10-CM | POA: Insufficient documentation

## 2014-02-21 DIAGNOSIS — S5002XA Contusion of left elbow, initial encounter: Secondary | ICD-10-CM

## 2014-02-21 DIAGNOSIS — R0789 Other chest pain: Secondary | ICD-10-CM

## 2014-02-21 DIAGNOSIS — I10 Essential (primary) hypertension: Secondary | ICD-10-CM | POA: Insufficient documentation

## 2014-02-21 DIAGNOSIS — S298XXA Other specified injuries of thorax, initial encounter: Secondary | ICD-10-CM | POA: Insufficient documentation

## 2014-02-21 DIAGNOSIS — Z950 Presence of cardiac pacemaker: Secondary | ICD-10-CM | POA: Insufficient documentation

## 2014-02-21 DIAGNOSIS — Y939 Activity, unspecified: Secondary | ICD-10-CM | POA: Insufficient documentation

## 2014-02-21 DIAGNOSIS — IMO0002 Reserved for concepts with insufficient information to code with codable children: Secondary | ICD-10-CM | POA: Insufficient documentation

## 2014-02-21 NOTE — ED Notes (Signed)
Renee Pain daughter:[phone]

## 2014-02-21 NOTE — ED Notes (Signed)
Patient presents today with a chief complaint of left elbow pain after falling at 12pm today while upstairs waiting for the elevator. Patient reports he turned, lost his balance, and fell on left side. Patient denies hitting head, denies dizziness, reports pain to left elbow and chest wall.

## 2014-02-21 NOTE — ED Provider Notes (Addendum)
CSN: [id number]     Arrival date & time 02/21/14  1354 History   First MD Initiated Contact with Patient 02/21/14 1408     Chief Complaint  Patient presents with  . Fall     (Consider location/radiation/quality/duration/timing/severity/associated sxs/prior Treatment) Patient is a 73 y.o. male presenting with fall. The history is provided by the patient.  Fall   patient here after losing his balance and had a mechanical fall and striking the left side of his chest as well as left double. Denies any headache or dizziness prior to the event. Did not strike his head when he fell. No hip or back pain. Pain characterized as sharp and worse with movement. Patient had a bandage applied to his elbow and transported here from upstairs  Past Medical History  Diagnosis Date  . CAD (coronary artery disease)     A.  s/p CFX in the past;   B.  cath 06/2010: LAD 30-40%, D1 50%, OM1 50%, AVCFX stent 30%, dCFX 70-80% (med Rx), mRCA 40%;      C.  Echo 06/2010: EF 60-65%, mod LVH; mild LAE     . Heart block   . 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 Hypertension   . Multiple myeloma   . Sleep apnea   . Degenerative disc disease   . Venous insufficiency   . Cubital tunnel syndrome   . Atrial flutter     A. s/p prior ablation;  B.  s/p CTI ablation 10/24/10 (Dr. Caryl Comes)   Past Surgical History  Procedure Laterality Date  . Coronary angioplasty with stent placement  2005  . Ventral hernia repair    . Right olecranon nerve surgery     Family History  Problem Relation Age of Onset  . Heart disease Father   . Cancer Father     prostate  . Heart disease Brother    History  Substance Use Topics  . Smoking status: Former Smoker -- 1.00 packs/day for 49 years    Types: Cigarettes    Quit date: 09/12/2004  . Smokeless tobacco: Former Systems developer     Comment: started at age 2; smoked 1 ppd; quit in 2006  . Alcohol Use: 14.0 oz/week    28 drink(s) per week     Comment: occasional     Review of Systems  All other systems reviewed and are negative.     Allergies  Review of patient's allergies indicates no known allergies.  Home Medications   Prior to Admission medications   Medication Sig Start Date End Date Taking? Authorizing Provider  aspirin 81 MG tablet Take 81 mg by mouth daily.      Historical Provider, MD  atenolol (TENORMIN) 50 MG tablet TAKE ONE TABLET BY MOUTH ONCE DAILY    Josue Hector, MD  atorvastatin (LIPITOR) 10 MG tablet Take 10 mg by mouth daily.    Historical Provider, MD  Glucosamine HCl 1000 MG TABS Take 2 tablets by mouth daily.    Historical Provider, MD  losartan (COZAAR) 100 MG tablet Take 100 mg by mouth daily.    Historical Provider, MD  Multiple Vitamin (MULTIVITAMIN) capsule Take 1 capsule by mouth daily.      Historical Provider, MD  mupirocin cream (BACTROBAN) 2 % Apply 1 application topically daily. To scalp    Historical Provider, MD  NITROSTAT 0.4 MG SL tablet DISSOLVE ONE TABLET UNDER THE TONGUE EVERY 5 MINUTES AS NEEDED FOR CHEST PAIN.  DO NOT EXCEED  A TOTAL OF 3 DOSES IN 15 MINUTES 07/06/12   Josue Hector, MD  NON FORMULARY CPAP Machine at night    Historical Provider, MD  Omega-3 Fatty Acids (FISH OIL) 1200 MG CAPS Take 1 capsule by mouth daily.      Historical Provider, MD  SF 5000 PLUS 1.1 % CREA dental cream as needed. For teeth 05/25/12   Historical Provider, MD  thiamine 100 MG tablet Take 1 tablet (100 mg total) by mouth daily. 04/29/13   Janece Canterbury, MD   BP 184/75  Temp(Src) 97.9 F (36.6 C) (Oral)  Resp 17  SpO2 99% Physical Exam  Nursing note and vitals reviewed. Constitutional: He is oriented to person, place, and time. He appears well-developed and well-nourished.  Non-toxic appearance. No distress.  HENT:  Head: Normocephalic and atraumatic.  Eyes: Conjunctivae, EOM and lids are normal. Pupils are equal, round, and reactive to light.  Neck: Normal range of motion. Neck supple. No tracheal deviation  present. No mass present.  Cardiovascular: Normal rate, regular rhythm and normal heart sounds.  Exam reveals no gallop.   No murmur heard. Pulmonary/Chest: Effort normal and breath sounds normal. No stridor. No respiratory distress. He has no decreased breath sounds. He has no wheezes. He has no rhonchi. He has no rales.    Abdominal: Soft. Normal appearance and bowel sounds are normal. He exhibits no distension. There is no tenderness. There is no rebound and no CVA tenderness.  Musculoskeletal: Normal range of motion. He exhibits no edema and no tenderness.       Arms: Neurological: He is alert and oriented to person, place, and time. He has normal strength. No cranial nerve deficit or sensory deficit. GCS eye subscore is 4. GCS verbal subscore is 5. GCS motor subscore is 6.  Skin: Skin is warm and dry. No abrasion and no rash noted.  Psychiatric: He has a normal mood and affect. His speech is normal and behavior is normal.    ED Course  Procedures (including critical care time) Labs Review Labs Reviewed - No data to display  Imaging Review No results found.   EKG Interpretation None      MDM   Final diagnoses:  None    Take his x-rays here without evidence of fracture. Will be given a sling for comfort.    Leota Jacobsen, MD 02/21/14 1503  Leota Jacobsen, MD 02/21/14 [phone]

## 2014-02-21 NOTE — Discharge Instructions (Signed)
Chest Wall Pain Chest wall pain is pain in or around the bones and muscles of your chest. It may take up to 6 weeks to get better. It may take longer if you must stay physically active in your work and activities.  CAUSES  Chest wall pain may happen on its own. However, it may be caused by:  A viral illness like the flu.  Injury.  Coughing.  Exercise.  Arthritis.  Fibromyalgia.  Shingles. HOME CARE INSTRUCTIONS   Avoid overtiring physical activity. Try not to strain or perform activities that cause pain. This includes any activities using your chest or your abdominal and side muscles, especially if heavy weights are used.  Put ice on the sore area.  Put ice in a plastic bag.  Place a towel between your skin and the bag.  Leave the ice on for 15-20 minutes per hour while awake for the first 2 days.  Only take over-the-counter or prescription medicines for pain, discomfort, or fever as directed by your caregiver. SEEK IMMEDIATE MEDICAL CARE IF:   Your pain increases, or you are very uncomfortable.  You have a fever.  Your chest pain becomes worse.  You have new, unexplained symptoms.  You have nausea or vomiting.  You feel sweaty or lightheaded.  You have a cough with phlegm (sputum), or you cough up blood. MAKE SURE YOU:   Understand these instructions.  Will watch your condition.  Will get help right away if you are not doing well or get worse. Document Released: 08/29/2005 Document Revised: 11/21/2011 Document Reviewed: 04/25/2011 [facility] Patient Information 2014 [place], [place]. Hematoma A hematoma is a collection of blood under the skin, in an organ, in a body space, in a joint space, or in other tissue. The blood can clot to form a lump that you can see and feel. The lump is often firm and may sometimes become sore and tender. Most hematomas get better in a few days to weeks. However, some hematomas may be serious and require medical care. Hematomas can  range in size from very small to very large. CAUSES  A hematoma can be caused by a blunt or penetrating injury. It can also be caused by spontaneous leakage from a blood vessel under the skin. Spontaneous leakage from a blood vessel is more likely to occur in older people, especially those taking blood thinners. Sometimes, a hematoma can develop after certain medical procedures. SIGNS AND SYMPTOMS   A firm lump on the body.  Possible pain and tenderness in the area.  Bruising.Blue, dark blue, purple-red, or yellowish skin may appear at the site of the hematoma if the hematoma is close to the surface of the skin. For hematomas in deeper tissues or body spaces, the signs and symptoms may be subtle. For example, an intra-abdominal hematoma may cause abdominal pain, weakness, fainting, and shortness of breath. An intracranial hematoma may cause a headache or symptoms such as weakness, trouble speaking, or a change in consciousness. DIAGNOSIS  A hematoma can usually be diagnosed based on your medical history and a physical exam. Imaging tests may be needed if your health care provider suspects a hematoma in deeper tissues or body spaces, such as the abdomen, head, or chest. These tests may include ultrasonography or a CT scan.  TREATMENT  Hematomas usually go away on their own over time. Rarely does the blood need to be drained out of the body. Large hematomas or those that may affect vital organs will sometimes need surgical drainage  or monitoring. HOME CARE INSTRUCTIONS   Apply ice to the injured area:   Put ice in a plastic bag.   Place a towel between your skin and the bag.   Leave the ice on for 20 minutes, 2 3 times a day for the first 1 to 2 days.   After the first 2 days, switch to using warm compresses on the hematoma.   Elevate the injured area to help decrease pain and swelling. Wrapping the area with an elastic bandage may also be helpful. Compression helps to reduce swelling  and promotes shrinking of the hematoma. Make sure the bandage is not wrapped too tight.   If your hematoma is on a lower extremity and is painful, crutches may be helpful for a couple days.   Only take over-the-counter or prescription medicines as directed by your health care provider. SEEK IMMEDIATE MEDICAL CARE IF:   You have increasing pain, or your pain is not controlled with medicine.   You have a fever.   You have worsening swelling or discoloration.   Your skin over the hematoma breaks or starts bleeding.   Your hematoma is in your chest or abdomen and you have weakness, shortness of breath, or a change in consciousness.  Your hematoma is on your scalp (caused by a fall or injury) and you have a worsening headache or a change in alertness or consciousness. MAKE SURE YOU:   Understand these instructions.  Will watch your condition.  Will get help right away if you are not doing well or get worse. Document Released: 04/12/2004 Document Revised: 05/01/2013 Document Reviewed: 02/06/2013 [facility] Patient Information 2014 [place].

## 2014-03-11 ENCOUNTER — Encounter: Payer: Self-pay | Admitting: Cardiology

## 2014-03-12 ENCOUNTER — Encounter: Payer: Self-pay | Admitting: Internal Medicine

## 2014-03-31 ENCOUNTER — Telehealth: Payer: Self-pay | Admitting: Pulmonary Disease

## 2014-03-31 DIAGNOSIS — G4733 Obstructive sleep apnea (adult) (pediatric): Secondary | ICD-10-CM

## 2014-03-31 NOTE — Telephone Encounter (Signed)
[id number] x1 

## 2014-04-01 NOTE — Telephone Encounter (Signed)
Ok to change to whoever his insurance will allow.

## 2014-04-01 NOTE — Telephone Encounter (Signed)
Order placed for DME switch LM for pt to return call.

## 2014-04-01 NOTE — Telephone Encounter (Signed)
Pt returned call.  Call him at [phone].

## 2014-04-01 NOTE — Telephone Encounter (Signed)
Called spoke with pt. Reports he has been with [facility] getting CPAP supplies. He reports they are wanting to put him on a cont order to get new supplies every 90 days and pt does not want to do so. Now in order to get supplies it comes from [place]. Every time he calls to get supplies they always give him an excuse of what they are needing in order to get supplies. Pt reports he is not giving his credit card # over the phone. He was told by Huey Romans then they could not help him them per pt.   Pt wants to change DME's now. He has [employer].   Please advise Beckley thanks

## 2014-04-02 NOTE — Telephone Encounter (Signed)
There was an Order placed to change pt's DME to one that his [place] would cover as pt is unhappy with [facility] .  I called this morning @10 :06 am and spoke with pt.  He was going to contact his [employer] ins.  I gave him my name and direct phone number to call back so he could let me know which DME his ins recommended [facility]

## 2014-04-02 NOTE — Telephone Encounter (Signed)
Pt is returning call, pls call on his cell # [phone].[name]

## 2014-04-02 NOTE — Telephone Encounter (Signed)
Called spoke with patient and advised that [facility] for the DME change and the order was placed yesterday.  Pt is requesting a call with the new DME name/number.  Will forward to PCC's.

## 2014-04-02 NOTE — Telephone Encounter (Signed)
Waiting on pt to call back with dme ins will allow him to use [facility]

## 2014-04-03 NOTE — Telephone Encounter (Signed)
Pt called back & states that he is going by [facility]'s office today to talk with them.  He thinks, at this point, that he may want to stay with [facility] but he will call me back and let me know after he talks with them in person what he wishes to do [name]

## 2014-04-04 NOTE — Telephone Encounter (Signed)
Per call back from pt, he has been to [place] talked with them. For now, he has decided to stay with [facility]. He has an appt with Dr. Gwenette Greet in December & if he wants/needs to change DME cos, he will discuss it with Dr. Gwenette Greet at that [place]

## 2014-05-15 ENCOUNTER — Encounter: Payer: Self-pay | Admitting: Internal Medicine

## 2014-05-15 ENCOUNTER — Telehealth: Payer: Self-pay | Admitting: Internal Medicine

## 2014-05-15 ENCOUNTER — Ambulatory Visit (INDEPENDENT_AMBULATORY_CARE_PROVIDER_SITE_OTHER): Payer: Medicare HMO | Admitting: *Deleted

## 2014-05-15 ENCOUNTER — Telehealth: Payer: Self-pay | Admitting: Cardiology

## 2014-05-15 DIAGNOSIS — I495 Sick sinus syndrome: Secondary | ICD-10-CM

## 2014-05-15 DIAGNOSIS — I498 Other specified cardiac arrhythmias: Secondary | ICD-10-CM

## 2014-05-15 DIAGNOSIS — I471 Supraventricular tachycardia: Secondary | ICD-10-CM

## 2014-05-15 NOTE — Progress Notes (Signed)
Remote pacemaker transmission.   

## 2014-05-15 NOTE — Telephone Encounter (Signed)
LMOVM reminding pt to send remote transmission.   

## 2014-05-15 NOTE — Telephone Encounter (Signed)
New message ° ° ° ° ° ° °Did we get his remote transmission? °

## 2014-05-15 NOTE — Telephone Encounter (Signed)
LMOVM informing pt that we did receive his transmission.  

## 2014-05-22 ENCOUNTER — Other Ambulatory Visit: Payer: Self-pay | Admitting: Cardiovascular Disease

## 2014-05-23 LAB — MDC_IDC_ENUM_SESS_TYPE_REMOTE
Brady Statistic AP VP Percent: 76 %
Brady Statistic AP VS Percent: 1 %
Brady Statistic AS VS Percent: 1 %
Brady Statistic RA Percent Paced: 76 %
Brady Statistic RV Percent Paced: 99 %
Implantable Pulse Generator Model: 2110
Implantable Pulse Generator Serial Number: 7163077
Lead Channel Impedance Value: 450 Ohm
Lead Channel Impedance Value: 480 Ohm
Lead Channel Pacing Threshold Amplitude: 0.75 V
Lead Channel Pacing Threshold Pulse Width: 0.3 ms
Lead Channel Sensing Intrinsic Amplitude: 12 mV
Lead Channel Sensing Intrinsic Amplitude: 2.6 mV
Lead Channel Setting Pacing Amplitude: 1.625
Lead Channel Setting Sensing Sensitivity: 2 mV
MDC IDC MSMT BATTERY REMAINING LONGEVITY: 92 mo
MDC IDC MSMT BATTERY REMAINING PERCENTAGE: 67 %
MDC IDC MSMT BATTERY VOLTAGE: 2.93 V
MDC IDC MSMT LEADCHNL RA PACING THRESHOLD AMPLITUDE: 0.625 V
MDC IDC MSMT LEADCHNL RV PACING THRESHOLD PULSEWIDTH: 0.4 ms
MDC IDC SESS DTM: 20150903181849
MDC IDC SET LEADCHNL RV PACING AMPLITUDE: 1 V
MDC IDC SET LEADCHNL RV PACING PULSEWIDTH: 0.4 ms
MDC IDC STAT BRADY AS VP PERCENT: 24 %

## 2014-06-12 ENCOUNTER — Telehealth: Payer: Self-pay | Admitting: Hematology

## 2014-06-12 ENCOUNTER — Ambulatory Visit (HOSPITAL_BASED_OUTPATIENT_CLINIC_OR_DEPARTMENT_OTHER): Payer: Medicare HMO | Admitting: Hematology

## 2014-06-12 ENCOUNTER — Other Ambulatory Visit (HOSPITAL_BASED_OUTPATIENT_CLINIC_OR_DEPARTMENT_OTHER): Payer: Medicare HMO

## 2014-06-12 VITALS — BP 145/85 | HR 96 | Temp 98.6°F | Resp 18 | Ht 72.0 in | Wt 220.4 lb

## 2014-06-12 DIAGNOSIS — D472 Monoclonal gammopathy: Secondary | ICD-10-CM

## 2014-06-12 DIAGNOSIS — Z23 Encounter for immunization: Secondary | ICD-10-CM

## 2014-06-12 DIAGNOSIS — C9000 Multiple myeloma not having achieved remission: Secondary | ICD-10-CM

## 2014-06-12 LAB — CBC WITH DIFFERENTIAL/PLATELET
BASO%: 0.6 % (ref 0.0–2.0)
Basophils Absolute: 0 10*3/uL (ref 0.0–0.1)
EOS ABS: 0.3 10*3/uL (ref 0.0–0.5)
EOS%: 5.9 % (ref 0.0–7.0)
HEMATOCRIT: 35.7 % — AB (ref 38.4–49.9)
HEMOGLOBIN: 12 g/dL — AB (ref 13.0–17.1)
LYMPH#: 1.1 10*3/uL (ref 0.9–3.3)
LYMPH%: 21.9 % (ref 14.0–49.0)
MCH: 32.8 pg (ref 27.2–33.4)
MCHC: 33.6 g/dL (ref 32.0–36.0)
MCV: 97.5 fL (ref 79.3–98.0)
MONO#: 0.7 10*3/uL (ref 0.1–0.9)
MONO%: 14 % (ref 0.0–14.0)
NEUT%: 57.6 % (ref 39.0–75.0)
NEUTROS ABS: 2.8 10*3/uL (ref 1.5–6.5)
Platelets: 175 10*3/uL (ref 140–400)
RBC: 3.66 10*6/uL — ABNORMAL LOW (ref 4.20–5.82)
RDW: 12.5 % (ref 11.0–14.6)
WBC: 4.9 10*3/uL (ref 4.0–10.3)
nRBC: 0 % (ref 0–0)

## 2014-06-12 LAB — COMPREHENSIVE METABOLIC PANEL (CC13)
ALBUMIN: 3.6 g/dL (ref 3.5–5.0)
ALT: 39 U/L (ref 0–55)
AST: 46 U/L — AB (ref 5–34)
Alkaline Phosphatase: 67 U/L (ref 40–150)
Anion Gap: 8 mEq/L (ref 3–11)
BUN: 6.4 mg/dL — AB (ref 7.0–26.0)
CHLORIDE: 100 meq/L (ref 98–109)
CO2: 27 mEq/L (ref 22–29)
Calcium: 9.4 mg/dL (ref 8.4–10.4)
Creatinine: 0.7 mg/dL (ref 0.7–1.3)
Glucose: 101 mg/dl (ref 70–140)
POTASSIUM: 4 meq/L (ref 3.5–5.1)
Sodium: 135 mEq/L — ABNORMAL LOW (ref 136–145)
Total Bilirubin: 0.7 mg/dL (ref 0.20–1.20)
Total Protein: 8.3 g/dL (ref 6.4–8.3)

## 2014-06-12 MED ORDER — INFLUENZA VAC SPLIT QUAD 0.5 ML IM SUSY
0.5000 mL | PREFILLED_SYRINGE | Freq: Once | INTRAMUSCULAR | Status: AC
  Administered 2014-06-12: 0.5 mL via INTRAMUSCULAR
  Filled 2014-06-12: qty 0.5

## 2014-06-15 ENCOUNTER — Encounter: Payer: Self-pay | Admitting: Hematology

## 2014-06-15 NOTE — Progress Notes (Signed)
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 male with a history as noted above presents for followup appointment. He was last seen by Dr Juliann Mule on 12/26/13 and today is first encounter with me. He reports feeling well overall. He denies any recent falls.  He saw his primary care physician Dr. Berneta Sages in March of 2015. He had some changes in his medications and has a follow-up annually this December.  He had a CXR in December of last year which was reportedly negative.  He sees Dr. Johnsie Cancel in November and continues to follow his pacemaker every 90 days and bi-annual follow-ups.  He denies chest pain or shortness of breath. He does have some dyspnea on moderate exertion. He has nitroglycerin but has not required its use in 6 months. Patient otherwise has no major changes since his last visit. He has not had any recent hospitalizations or emergency room visits. He has gained nearly 20 lbs since his visit. He started statin therapy in December 2014.  He denies yellowing of the eyes and pruritus.  He denies abdominal pain.  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 He is still active. "I mow my grass and I go for fishing".   MEDICAL HISTORY: Past Medical History  Diagnosis Date  . CAD (coronary artery disease)     A.  s/p CFX in the past;   B.  cath 06/2010: LAD 30-40%, D1 50%, OM1 50%, AVCFX  stent 30%, dCFX 70-80% (med Rx), mRCA 40%;      C.  Echo 06/2010: EF 60-65%, mod LVH; mild LAE     . Heart block   . 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 Hypertension   . Multiple myeloma   . Sleep apnea   . Degenerative disc disease   . Venous insufficiency   . Cubital tunnel syndrome   . Atrial flutter     A. s/p prior ablation;  B.  s/p CTI ablation 10/24/10 (Dr. Caryl Comes)    INTERIM HISTORY: has MULTIPLE  MYELOMA; SMOKER; OBSTRUCTIVE SLEEP APNEA; HYPERTENSION; Atrioventricular block, heart high-grade; Atrial tachycardia/fibrillation; SICK SINUS/ TACHY-BRADY SYNDROME; VENOUS INSUFFICIENCY; SCIATICA; SHORTNESS OF BREATH; ALLERGY; PACEMAKER, [employer] 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    ALLERGIES:  has No Known Allergies.  MEDICATIONS: has a current medication list which includes the following prescription(s): aspirin, atenolol, atorvastatin, glucosamine hcl, losartan, multivitamin, nitroglycerin, NON FORMULARY, fish oil, and thiamine.  SURGICAL HISTORY:  Past Surgical History  Procedure Laterality Date  . Coronary angioplasty with stent placement  2005  . Ventral hernia repair    . Right olecranon nerve surgery      REVIEW OF SYSTEMS:   Constitutional: Denies fevers, chills or abnormal weight loss Eyes: Denies blurriness of vision Ears, nose, mouth, throat, and face: Denies mucositis or sore throat Respiratory: Denies cough,  dyspnea or wheezes Cardiovascular: Denies palpitation, chest discomfort or lower extremity swelling Gastrointestinal:  Denies nausea, heartburn or change in bowel habits Skin: Denies abnormal skin rashes Lymphatics: Denies new lymphadenopathy or easy bruising Neurological:Denies numbness, tingling or new weaknesses Behavioral/Psych: Mood is stable,  no new changes  All other systems were reviewed with the patient and are negative.  PHYSICAL EXAMINATION: ECOG PERFORMANCE STATUS: 0  Blood pressure 145/85, pulse 96, temperature 98.6 F (37 C), temperature source Oral, resp. rate 18, height 6' (1.829 m), weight 220 lb 6.4 oz (99.973 kg), SpO2 98.00%.  GENERAL:alert, no distress and comfortable, pleasant elderly male who appears his stated age. HEAD: Normal cephalic atraumatic; no bruising identified. SKIN: skin color, texture, turgor are normal, no rashes or significant lesions EYES: normal, Conjunctiva are pink and non-injected, sclera clear OROPHARYNX:no exudate, no erythema and lips, buccal mucosa, and tongue normal  NECK: supple, thyroid normal size, non-tender, without nodularity LYMPH:  no palpable lymphadenopathy in the cervical, axillary or supraclavicular LUNGS: clear to auscultation and percussion with normal breathing effort HEART: regular rate & rhythm and no murmurs and no lower extremity edema ABDOMEN:abdomen soft, non-tender and normal bowel sounds Musculoskeletal:no cyanosis of digits and no clubbing  NEURO: alert & oriented x 3 with fluent speech, no focal motor/sensory deficits   LABORATORY DATA          SPEP & IFE with QIG results are pending from today. From before the trend was:    RADIOGRAPHIC STUDIES:  DG CHEST 2 VIEW 02/21/2014   CLINICAL DATA: Fall. Chest pain. EXAM: CHEST 2 VIEW COMPARISON: Two-view chest 07/03/2010. FINDINGS: The heart size is upper limits of normal. A dual-chamber pacemaker is in place without change. Mild bibasilar airspace disease likely reflects atelectasis. The upper lung Labelle are clear. No acute or healing fractures are present. There is no pneumothorax. The visualized soft tissues and bony thorax are unremarkable. IMPRESSION: 1. Mild bibasilar airspace disease likely reflects atelectasis. 2. Borderline cardiomegaly without failure. 3. No evidence for acute trauma.  Electronically Signed By: Lawrence Santiago M.D. On: 02/21/2014 14:42  METASTATIC BONE SURVEY 03/19/2010 Comparison:  Imaging at [address] lumbar spine MRI 10/31/2008, [facility] chest, abdominal pelvic CT  03/12/2010. Findings: No radiographic evidence for multiple myeloma is seen. Multi focal degenerative changes are seen to include cervical  degenerative disc disease C4-5 through C 71, (left greater than right) multilevel facet degenerative joint disease and uncinate degenerative joint disease mid to inferior cervical spine, degenerative changes thoracolumbar spine, metallic BB foreign bodies right inguinal region, contrast filled proximal transverse and sigmoid diverticulosis, and moderate atheromatous vascular calcification of normal caliber. IMPRESSION: 1. No evidence for multiple myeloma. 2. Multiple chronic findings as described   ASSESSMENT: 1. 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 His hemoglobin is 12 grams, white count and platelet count is normal. He has mild chronic hyponatremia 135. Kidney function is normal. His serum calcium is normal. Total protein in blood is 8.3 gram normal range. Serum kappa:Lambda ratio is low at 0.23 but stable compared from 5 months ago where it was 0.22. His Serum IgM level and M spike is pending from today. Last visit IgM was 3820 in April and M spike 2.51 gram.   PLAN:   --Mr. Reinoso continues to do fairly well with no evidence for progressive disease. His Myeloma markers from today are pending. He has a plasma cell dyscrasia characterized by significantly elevated IgM level and IgM lambda monoclonal gammopathy as well  as a low IgG level return to our clinic in 6 months at which time we will check a CBC, chemistries and quantitative immunoglobulins. --Patient was counseled extensively on fall prevention in the elderly and fall precautions. He was advised to take a multivitamin and calcium  and vitamin D.  --He did get a flu shot in office today.  --I am also ordering a skeletal survey prior to his next clinic visit.  All questions were answered. The patient knows to call the clinic with any problems, questions or concerns. We can certainly see the patient much sooner if necessary. He was provided an after visit summary of today's visit.   I spent 25 minutes counseling the patient face to face. The total time spent in the appointment was 30 minutes.    Bernadene Bell, MD Medical Hematologist/Oncologist [place] Pager: [phone] Office No: [phone]

## 2014-06-16 ENCOUNTER — Encounter: Payer: Self-pay | Admitting: Cardiology

## 2014-06-16 LAB — SPEP & IFE WITH QIG
Albumin ELP: 50.3 % — ABNORMAL LOW (ref 55.8–66.1)
Alpha-1-Globulin: 2.8 % — ABNORMAL LOW (ref 2.9–4.9)
Alpha-2-Globulin: 7.3 % (ref 7.1–11.8)
BETA GLOBULIN: 5.3 % (ref 4.7–7.2)
Beta 2: 30 % — ABNORMAL HIGH (ref 3.2–6.5)
Gamma Globulin: 4.3 % — ABNORMAL LOW (ref 11.1–18.8)
IgA: 97 mg/dL (ref 68–379)
IgG (Immunoglobin G), Serum: 321 mg/dL — ABNORMAL LOW (ref 650–1600)
IgM, Serum: 3270 mg/dL — ABNORMAL HIGH (ref 41–251)
M-SPIKE, %: 2.19 g/dL
TOTAL PROTEIN, SERUM ELECTROPHOR: 7.9 g/dL (ref 6.0–8.3)

## 2014-06-16 LAB — KAPPA/LAMBDA LIGHT CHAINS
Kappa free light chain: 1.11 mg/dL (ref 0.33–1.94)
Kappa:Lambda Ratio: 0.23 — ABNORMAL LOW (ref 0.26–1.65)
Lambda Free Lght Chn: 4.8 mg/dL — ABNORMAL HIGH (ref 0.57–2.63)

## 2014-08-13 ENCOUNTER — Encounter: Payer: Self-pay | Admitting: Internal Medicine

## 2014-08-13 ENCOUNTER — Ambulatory Visit (INDEPENDENT_AMBULATORY_CARE_PROVIDER_SITE_OTHER): Payer: Medicare HMO | Admitting: Internal Medicine

## 2014-08-13 ENCOUNTER — Encounter: Payer: Self-pay | Admitting: Cardiovascular Disease

## 2014-08-13 ENCOUNTER — Ambulatory Visit (INDEPENDENT_AMBULATORY_CARE_PROVIDER_SITE_OTHER): Payer: Medicare HMO | Admitting: Cardiovascular Disease

## 2014-08-13 VITALS — BP 140/80 | HR 76 | Ht 72.0 in | Wt 226.0 lb

## 2014-08-13 DIAGNOSIS — I4719 Other supraventricular tachycardia: Secondary | ICD-10-CM

## 2014-08-13 DIAGNOSIS — Z950 Presence of cardiac pacemaker: Secondary | ICD-10-CM

## 2014-08-13 DIAGNOSIS — I442 Atrioventricular block, complete: Secondary | ICD-10-CM

## 2014-08-13 DIAGNOSIS — I2583 Coronary atherosclerosis due to lipid rich plaque: Secondary | ICD-10-CM

## 2014-08-13 DIAGNOSIS — I495 Sick sinus syndrome: Secondary | ICD-10-CM

## 2014-08-13 DIAGNOSIS — I471 Supraventricular tachycardia: Secondary | ICD-10-CM

## 2014-08-13 DIAGNOSIS — D472 Monoclonal gammopathy: Secondary | ICD-10-CM

## 2014-08-13 DIAGNOSIS — Z45018 Encounter for adjustment and management of other part of cardiac pacemaker: Secondary | ICD-10-CM

## 2014-08-13 DIAGNOSIS — I2510 Atherosclerotic heart disease of native coronary artery without angina pectoris: Secondary | ICD-10-CM

## 2014-08-13 LAB — MDC_IDC_ENUM_SESS_TYPE_INCLINIC
Battery Remaining Longevity: 115.2 mo
Brady Statistic RV Percent Paced: 99.99 %
Implantable Pulse Generator Model: 2110
Implantable Pulse Generator Serial Number: 7163077
Lead Channel Impedance Value: 437.5 Ohm
Lead Channel Impedance Value: 475 Ohm
Lead Channel Pacing Threshold Amplitude: 0.5 V
Lead Channel Sensing Intrinsic Amplitude: 12 mV
Lead Channel Sensing Intrinsic Amplitude: 2.6 mV
Lead Channel Setting Pacing Pulse Width: 0.4 ms
MDC IDC MSMT BATTERY VOLTAGE: 2.95 V
MDC IDC MSMT LEADCHNL RA PACING THRESHOLD PULSEWIDTH: 0.3 ms
MDC IDC MSMT LEADCHNL RV PACING THRESHOLD AMPLITUDE: 0.875 V
MDC IDC MSMT LEADCHNL RV PACING THRESHOLD PULSEWIDTH: 0.4 ms
MDC IDC SESS DTM: 20151202102441
MDC IDC SET LEADCHNL RA PACING AMPLITUDE: 1.5 V
MDC IDC SET LEADCHNL RV PACING AMPLITUDE: 1.125
MDC IDC SET LEADCHNL RV SENSING SENSITIVITY: 2 mV
MDC IDC STAT BRADY RA PERCENT PACED: 77 %

## 2014-08-13 MED ORDER — AMOXICILLIN 500 MG PO CAPS: ORAL_CAPSULE | ORAL | Status: DC

## 2014-08-13 NOTE — Patient Instructions (Addendum)
Your physician wants you to follow-up in:  6 MONTHS WITH DR NISHAN  You will receive a reminder letter in the mail two months in advance. If you don't receive a letter, please call our office to schedule the follow-up appointment. Your physician recommends that you continue on your current medications as directed. Please refer to the Current Medication list given to you today. 

## 2014-08-13 NOTE — Assessment & Plan Note (Signed)
Normal pacer function interrogated today with EP

## 2014-08-13 NOTE — Assessment & Plan Note (Signed)
Well controlled.  Continue current medications and low sodium Dash type diet.    

## 2014-08-13 NOTE — Assessment & Plan Note (Signed)
Stable with no angina and good activity level.  Continue medical Rx  

## 2014-08-13 NOTE — Patient Instructions (Signed)

## 2014-08-13 NOTE — Progress Notes (Signed)
Patient ID: [name], male   DOB: 08-09-1941, 73 y.o.   MRN: [id number] [name] is a 73 y.o. male Seen in followup for atrial flutter status post ablation. He also has a history of complete heart block status post pacemaker. He is without significant symptoms of chest pain shortness of breath or edema.  He has coronary artery disease - status post circumflex stent in the past.Cardiac catheterization in October 2011 demonstrated left anterior  descending 30% to 40% stenosis with other nonobstructive disease with medical therapy recommended.  Has Merlin machine to check pacer at home Hyponatremic on diuretic so stopped  Urologic issues resolved Saw Dr Risa Grill last month Needs new nitro   Has a plasma cell dyscrasia. Minor change in LFTls AST 46 No need to stop statin Wife recently diagnosed with colon CA and had surgery with Dr Zella Richer     ROS: Denies fever, malais, weight loss, blurry vision, decreased visual acuity, cough, sputum, SOB, hemoptysis, pleuritic pain, palpitaitons, heartburn, abdominal pain, melena, lower extremity edema, claudication, or rash.  All other systems reviewed and negative  General: Affect appropriate Healthy:  appears stated age 44: normal Neck supple with no adenopathy JVP normal no bruits no thyromegaly Lungs clear with no wheezing and good diaphragmatic motion Heart:  S1/S2 no murmur, no rub, gallop or click PMI normal Abdomen: benighn, BS positve, no tenderness, no AAA  Ventral hernia no bruit.  No HSM or HJR Distal pulses intact with no bruits No edema Neuro non-focal Skin warm and dry No muscular weakness   Current Outpatient Prescriptions  Medication Sig Dispense Refill  . aspirin 81 MG tablet Take 81 mg by mouth daily.      Marland Kitchen atenolol (TENORMIN) 50 MG tablet TAKE ONE TABLET BY MOUTH ONCE DAILY 90 tablet 0  . atorvastatin (LIPITOR) 10 MG tablet Take 10 mg by mouth daily.    . Glucosamine HCl 1000 MG TABS Take 2 tablets by  mouth daily.    Marland Kitchen losartan (COZAAR) 100 MG tablet Take 100 mg by mouth daily.    . Multiple Vitamin (MULTIVITAMIN) capsule Take 1 capsule by mouth daily.      . NON FORMULARY CPAP Machine at night    . Omega-3 Fatty Acids (FISH OIL) 1200 MG CAPS Take 1 capsule by mouth daily.      Marland Kitchen thiamine 100 MG tablet Take 100 mg by mouth daily.    . nitroGLYCERIN (NITROSTAT) 0.4 MG SL tablet Place 0.4 mg under the tongue every 5 (five) minutes as needed for chest pain.     No current facility-administered medications for this visit.    Allergies  Review of patient's allergies indicates no known allergies.  Electrocardiogram: 6/12  SR v pacing   Assessment and Plan

## 2014-08-13 NOTE — Progress Notes (Signed)
Patient Care Team: Tivis Ringer, MD as PCP - General (Internal Medicine) Jeanie Cooks, MD (Hematology and Oncology)   HPI  [name] is a 73 y.o. male Seen in followup for a history of complete heart block status post pacemaker 2011  He has a history of prior catheter ablation for atrial flutter. If coronary artery disease with prior stenting.  Cardiac catheterization in October 2011 demonstrated left anterior descending 30% to 40% stenosis with other nonobstructive disease with medical therapy recommended.    No significant shortness of breath or chest pain.    He describes a fall when he was at [facility]. He turned abruptly. He has no orthostatic lightheadedness when he does have some gait instability which she blames partially on his knees.   Past Medical History  Diagnosis Date  . CAD (coronary artery disease)     A.  s/p CFX in the past;   B.  cath 06/2010: LAD 30-40%, D1 50%, OM1 50%, AVCFX stent 30%, dCFX 70-80% (med Rx), mRCA 40%;      C.  Echo 06/2010: EF 60-65%, mod LVH; mild LAE     . Heart block   . 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 Hypertension   . Multiple myeloma   . Sleep apnea   . Degenerative disc disease   . Venous insufficiency   . Cubital tunnel syndrome   . Atrial flutter     A. s/p prior ablation;  B.  s/p CTI ablation 10/24/10 (Dr. Caryl Comes)    Past Surgical History  Procedure Laterality Date  . Coronary angioplasty with stent placement  2005  . Ventral hernia repair    . Right olecranon nerve surgery      Current Outpatient Prescriptions  Medication Sig Dispense Refill  . amoxicillin (AMOXIL) 500 MG capsule 1 HOUR  BEFORE  DENTAL  PROCEDURE 4 capsule 2  . aspirin 81 MG tablet Take 81 mg by mouth daily.      Marland Kitchen atenolol (TENORMIN) 50 MG tablet TAKE ONE TABLET BY MOUTH ONCE DAILY 90 tablet 0  . atorvastatin (LIPITOR) 10 MG tablet Take 10 mg by mouth daily.    . Glucosamine HCl 1000 MG TABS Take  2 tablets by mouth daily.    Marland Kitchen losartan (COZAAR) 100 MG tablet Take 100 mg by mouth daily.    . Multiple Vitamin (MULTIVITAMIN) capsule Take 1 capsule by mouth daily.      . nitroGLYCERIN (NITROSTAT) 0.4 MG SL tablet Place 0.4 mg under the tongue every 5 (five) minutes as needed for chest pain.    . NON FORMULARY CPAP Machine at night    . Omega-3 Fatty Acids (FISH OIL) 1200 MG CAPS Take 1 capsule by mouth daily.      Marland Kitchen thiamine 100 MG tablet Take 100 mg by mouth daily.     No current facility-administered medications for this visit.    No Known Allergies  Review of Systems negative except from HPI and PMH  Physical Exam BP 140/80 mmHg  Pulse 76  Ht 6' (1.829 m)  Wt 226 lb (102.513 kg)  BMI 30.64 kg/m2 Well developed and well nourished in no acute distress HENT normal E scleral and icterus clear Neck Supple JVP flat; carotids brisk and full Clear to ausculation Device pocket well healed; without hematoma or erythema.  There is no tethering Regular rate and rhythm, no murmurs gallops or rub Soft with active bowel sounds No  clubbing cyanosis none Edema Alert and oriented, grossly normal motor and sensory function; he is unable to stand with his feet together. He is unable to walk a straight line. Skin Warm and Dry  ECG AV pacing   Assessment and plan   Complete heart block   Pacemaker implantation-St. Jude   Gait instability   Fall   The patient's heart rhythm issues are stable. Device function is normal.   Review of a history strongly suggestive the fall was not related to syncope but rather gait instability  His neurological exam is strikingly abnormal and his inability to stand on his feet together or walk a straight line. I have suggested that when he see his PCP in 2 weeks, that this might require further evaluation. Physical therapy might be of some benefit.

## 2014-08-13 NOTE — Assessment & Plan Note (Signed)
Cr and Calcium ok HB ok  F/u cancer center.  May need another BM biopsy

## 2014-08-19 ENCOUNTER — Encounter: Payer: Self-pay | Admitting: Cardiovascular Disease

## 2014-08-21 ENCOUNTER — Other Ambulatory Visit: Payer: Self-pay | Admitting: Cardiovascular Disease

## 2014-08-22 ENCOUNTER — Ambulatory Visit (INDEPENDENT_AMBULATORY_CARE_PROVIDER_SITE_OTHER): Payer: Medicare HMO | Admitting: Pulmonary Disease

## 2014-08-22 ENCOUNTER — Encounter: Payer: Self-pay | Admitting: Pulmonary Disease

## 2014-08-22 VITALS — BP 134/72 | HR 72 | Temp 98.0°F | Ht 71.0 in | Wt 222.4 lb

## 2014-08-22 DIAGNOSIS — G4733 Obstructive sleep apnea (adult) (pediatric): Secondary | ICD-10-CM

## 2014-08-22 NOTE — Patient Instructions (Signed)
Continue on cpap, and keep up with your mask changes and supplies. Work on weight loss followup with me again in one year.

## 2014-08-22 NOTE — Assessment & Plan Note (Signed)
The patient is doing very well with his current C Pap setup, and is satisfied with his sleep and daytime alertness. I have asked him to keep up with his mask changes and supplies, and to work on aggressive weight loss. I will see him back in one year if doing well.

## 2014-08-22 NOTE — Progress Notes (Signed)
   Subjective:    Patient ID: AYDIEN MAJETTE, male    DOB: February 18, 1941, 73 y.o.   MRN: [id number]  HPI The patient comes in today for follow-up of his known obstructive sleep apnea. He is wearing C Pap compliantly, and is having no issues with his mask fit or pressure. He is satisfied with his sleep, as well as his daytime alertness.  Of note, his weight is fairly stable from a year ago.   Review of Systems  Constitutional: Negative for fever and unexpected weight change.  HENT: Negative for congestion, dental problem, ear pain, nosebleeds, postnasal drip, rhinorrhea, sinus pressure, sneezing, sore throat and trouble swallowing.   Eyes: Negative for redness and itching.  Respiratory: Negative for cough, chest tightness, shortness of breath and wheezing.   Cardiovascular: Negative for palpitations and leg swelling.  Gastrointestinal: Negative for nausea and vomiting.  Genitourinary: Negative for dysuria.  Musculoskeletal: Negative for joint swelling.  Skin: Negative for rash.  Neurological: Negative for headaches.  Hematological: Does not bruise/bleed easily.  Psychiatric/Behavioral: Negative for dysphoric mood. The patient is not nervous/anxious.        Objective:   Physical Exam Overweight male in no acute distress Nose without purulence or discharge noted Neck without lymphadenopathy or thyromegaly No skin breakdown or pressure necrosis from the C Pap mask Lower extremities with mild edema, no cyanosis Alert and oriented, moves all 4 extremities.       Assessment & Plan:

## 2014-08-28 ENCOUNTER — Telehealth: Payer: Self-pay | Admitting: Gastroenterology

## 2014-09-18 ENCOUNTER — Ambulatory Visit: Payer: Medicare HMO | Attending: Internal Medicine

## 2014-09-18 DIAGNOSIS — R279 Unspecified lack of coordination: Secondary | ICD-10-CM

## 2014-09-18 DIAGNOSIS — Z9181 History of falling: Secondary | ICD-10-CM | POA: Diagnosis not present

## 2014-09-18 NOTE — Therapy (Signed)
[facility] [address] [place], [place], [address] Phone: [phone]   Fax:  [phone]  Physical Therapy Evaluation  Patient Details  Name: [name] MRN: [id number] Date of Birth: 1941/03/11 Referring Provider:  Tivis Ringer, MD  Encounter Date: 09/18/2014      PT End of Session - 09/18/14 1031    Visit Number 1   Number of Visits 17   Date for PT Re-Evaluation 11/17/14   Authorization Type [employer] G code every 10th visit   PT Start Time 0850   PT Stop Time 0933   PT Time Calculation (min) 43 min      Past Medical History  Diagnosis Date  . CAD (coronary artery disease)     A.  s/p CFX in the past;   B.  cath 06/2010: LAD 30-40%, D1 50%, OM1 50%, AVCFX stent 30%, dCFX 70-80% (med Rx), mRCA 40%;      C.  Echo 06/2010: EF 60-65%, mod LVH; mild LAE     . Heart block   . 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 Hypertension   . Multiple myeloma   . Sleep apnea   . Degenerative disc disease   . Venous insufficiency   . Cubital tunnel syndrome   . Atrial flutter     A. s/p prior ablation;  B.  s/p CTI ablation 10/24/10 (Dr. Caryl Comes)    Past Surgical History  Procedure Laterality Date  . Coronary angioplasty with stent placement  2005  . Ventral hernia repair    . Right olecranon nerve surgery      There were no vitals taken for this visit.  Visit Diagnosis:  Lack of coordination  History of fall      Subjective Assessment - 09/18/14 0857    Symptoms Pt has had multiple falls. On one occasion he fell while working in his yard, he also fell in June when he was visiting someone at the hospital and he fell on an elevator when turning toward the left.    Pertinent History Pacemaker, cancer-multiple myeloma, sciatica   Patient Stated Goals Improve gait and balance so he can have knee surgery.   Currently in Pain? No/denies          [facility] PT Assessment - 09/18/14  0001    Assessment   Medical Diagnosis gait and postural instability   Onset Date --  2 years ago   Precautions   Precautions Fall;ICD/Pacemaker  active cancer   Balance Screen   Has the patient fallen in the past 6 months Yes   How many times? 2   Has the patient had a decrease in activity level because of a fear of falling?  Yes   Is the patient reluctant to leave their home because of a fear of falling?  No   Home Environment   Living Enviornment Private residence   Living Arrangements Spouse/significant other   Available Help at Discharge Family   Type of [place] to enter   Entrance Stairs-Number of Steps 3   Entrance Stairs-Rails Right   Home Layout Two level  basement with [place] - single point   Prior Function   Level of [place] with homemaking with ambulation;Independent with transfers  Pt has been using cane for 18 months   Leisure --  working in his yard--limited now    Observation/Other Assessments   Focus  on Therapeutic Outcomes (FOTO)  politely refused   Posture/Postural Control   Posture/Postural Control Postural limitations   Postural Limitations Rounded Shoulders;Forward head;Posterior pelvic tilt   Transfers   Transfers --  Indep. with transfers and bed mobility   Ambulation/Gait   Ambulation/Gait Yes   Ambulation/Gait Assistance 5: Supervision   Ambulation/Gait Assistance Details --  supervision for unsteadiness with turns   Ambulation Distance (Feet) 200 Feet   Assistive device None  uses cane outside   Gait Pattern Wide base of support;Decreased stance time - left   Gait velocity 2.69 ft/sec   Stairs Yes   Stairs Assistance 5: Supervision   Stair Management Technique One rail Right;Step to pattern  pulls himself up on rail indicating functionally weak quads   Number of Stairs 4   Ramp 5: Supervision  unsteady, step-to pattern   Curb --  cannot perform safely without UE support    Standardized Balance Assessment   Standardized Balance Assessment Berg Balance Test;Timed Up and Go Test;Dynamic Gait Index   Berg Balance Test   Sit to Stand Able to stand without using hands and stabilize independently   Standing Unsupported Able to stand safely 2 minutes   Sitting with Back Unsupported but Feet Supported on Floor or Stool Able to sit safely and securely 2 minutes   Stand to Sit Sits safely with minimal use of hands   Transfers Able to transfer safely, minor use of hands   Standing Unsupported with Eyes Closed Able to stand 10 seconds with supervision   Standing Ubsupported with Feet Together Able to place feet together independently and stand for 1 minute with supervision   From Standing, Reach Forward with Outstretched Arm Can reach forward >12 cm safely (5")   From Standing Position, Pick up Object from Floor Able to pick up shoe, needs supervision   From Standing Position, Turn to Look Behind Over each Shoulder Looks behind from both sides and weight shifts well   Turn 360 Degrees Able to turn 360 degrees safely in 4 seconds or less   Standing Unsupported, Alternately Place Feet on Step/Stool Able to complete >2 steps/needs minimal assist   Standing Unsupported, One Foot in Front Able to take small step independently and hold 30 seconds   Standing on One Leg Tries to lift leg/unable to hold 3 seconds but remains standing independently   Total Score 44   Timed Up and Go Test   TUG Normal TUG   Normal TUG (seconds) 12.16   TUG Comments unsteady turn   High Level Balance   High Level Balance Activites --  Eyes closed, head turns, 2 pillows demo loss of balance                            PT Short Term Goals - 09/18/14 1036    PT SHORT TERM GOAL #1   Title Demonstrate correct performance of HEP to address balance impairment Target: 10/17/14   PT SHORT TERM GOAL #2   Title Complete DGI and write appropriate LTG   PT SHORT TERM GOAL #3   Title  Demonstrate ability to ascend/descend 4 stairs with step over step pattern and without pulling himself up using rail for increased safety with home access. Target: 10/17/14   PT SHORT TERM GOAL #4   Title Increase Berg Balance Test to 50/56 for decreasing fall risk. Target: 10/17/14   PT SHORT TERM GOAL #5   Title Demonstrate ability to  perform TUG without unsteadiness during turn, maintaining time of 12.10 seconds for decreased fall risk Target: 10/17/14           PT Long Term Goals - September 30, 2014 1040    PT LONG TERM GOAL #1   Title Verbalize understanding of fall prevention strategies in home environment. Target 11/17/14   PT LONG TERM GOAL #2   Title Dynamic Gait Index long term goal: _____________________ Target 11/17/14   PT LONG TERM GOAL #3   Title Ascend and descend curb with supervision without UE support for improved community access. Target 11/17/14   PT LONG TERM GOAL #4   Title Ambulate 1000' on outdoor, unlevel, grass, concrete, gravel and pinestraw independently for improved ability to ambulate in his yard. Target 11/17/14   PT LONG TERM GOAL #5   Title Stand on two stacked pillows and turn head right/left without UE support, with eyes closed x 30 seconds without loss of balance for improved vestibular function for balance. Target 11/17/14               Plan - September 30, 2014 1032    Clinical Impression Statement Pt has balance impairment which he resports is limiting him from undergoing bilateral knee replacement. Pt will benefit from skilled PT services to address balance and gait imprairment, improve flexibility and functionl strength of BLE. for increased functional independence   Rehab Potential Good   PT Frequency 2x / week   PT Duration 8 weeks   PT Treatment/Interventions ADLs/Self Care Home Management;Gait training;Stair training;Functional mobility training;Therapeutic exercise;Therapeutic activities;Balance training;Neuromuscular re-education;Patient/family education;Energy  conservation   PT Next Visit Plan HEP to include: compliant surface, feet together, head turns with eyes closed, tandem walking, high knee marching, heel/toe walking forward and backward, turning          G-Codes - 09/30/14 1045    Functional Assessment Tool Used Berg 44/56; DGI TBD; unsteadiness with TUG   Functional Limitation Mobility: Walking and moving around   Mobility: Walking and Moving Around Current Status [phone]) At least 20 percent but less than 40 percent impaired, limited or restricted   Mobility: Walking and Moving Around Goal Status [phone]) At least 1 percent but less than 20 percent impaired, limited or restricted       Problem List Patient Active Problem List   Diagnosis Date Noted  . Alcohol intoxication in active alcoholic 70/48/8891  . Right-sided muscle weakness 04/28/2013  . 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 Coronary artery disease-nonobstructive catheter in October 2011 12/27/2010  . PACEMAKER, St. Jude dual-chamber 09/14/2010  . MULTIPLE  MYELOMA 07/21/2010  . SMOKER 07/21/2010  . Obstructive sleep apnea 06/12/2009  . Atrioventricular block, heart high-grade 01/29/2009  . SICK SINUS/ TACHY-BRADY SYNDROME 01/29/2009  . Atrial tachycardia/fibrillation 01/14/2009  . SCIATICA 01/14/2009  . Essential hypertension 01/03/2009  . VENOUS INSUFFICIENCY 01/03/2009  . SHORTNESS OF BREATH 01/03/2009  . ALLERGY 01/03/2009    Delrae Sawyers D 30-Sep-2014, 10:50 AM  [facility] [address] [place], [place], [address] Phone: [phone]   Fax:  [phone]

## 2014-09-19 ENCOUNTER — Encounter: Payer: Self-pay | Admitting: Gastroenterology

## 2014-09-22 ENCOUNTER — Ambulatory Visit: Payer: Medicare HMO

## 2014-09-22 DIAGNOSIS — R279 Unspecified lack of coordination: Secondary | ICD-10-CM | POA: Diagnosis not present

## 2014-09-22 DIAGNOSIS — Z9181 History of falling: Secondary | ICD-10-CM

## 2014-09-22 NOTE — Patient Instructions (Signed)
Feet Together (Compliant Surface) Head Motion - Eyes Closed   Stand in corner on compliant surface: two stacked pillows with feet together and a chair in front of you in case you need to hold on. Close eyes and move head slowly:  up and down 10x Horizontal 10x Diagonal 10x each direction Perform daily  Copyright  VHI. All rights reserved.  Turning in Place: Solid Surface   Standing in place, turn quickly but safely 360 degrees toward the left, making sure you lift your feet with every step. Then again toward the right. Perform 3x toward each side.   Copyright  VHI. All rights reserved.  FUNCTIONAL MOBILITY: Toe Walking   Hold counter if needed and walk forward on toes. When you get to the end of the counter, walk backwards on toes. Perform 3 laps daily.   Copyright  VHI. All rights reserved.  FUNCTIONAL MOBILITY: Heel Walking   Hold counter if needed and walk forward on heels. When you get to the end of the counter, walk backwards on heels. Perform 3 laps daily.  Copyright  VHI. All rights reserved.  Knee High   Hold counter if needed and walk forward with high knee marching, holding each leg up for 2 seconds. When you get to the end of the counter, march backwards . Perform 3 laps daily. http://gt2.exer.us/767   Copyright  VHI. All rights reserved.  [employer] counter if needed. Walk with each foot directly in front of other, heel of one foot touching toes of other foot with each step. Both feet straight ahead. When you get to the end of the counter, walk backward in the same manner.   Copyright  VHI. All rights reserved.

## 2014-09-22 NOTE — Telephone Encounter (Signed)
Pt scheduled  

## 2014-09-22 NOTE — Therapy (Signed)
[facility] [address] [place], [place], [address] Phone: [phone]   Fax:  [phone]  Physical Therapy Treatment  Patient Details  Name: [name] MRN: [id number] Date of Birth: Aug 11, 1941 Referring Provider:  Tivis Ringer, MD  Encounter Date: 09/22/2014      PT End of Session - 09/22/14 0939    Visit Number 2   Number of Visits 17   Date for PT Re-Evaluation 11/17/14   Authorization Type A[employer]na Medicare G code every 10th visit   PT Start Time 0803   PT Stop Time 0844   PT Time Calculation (min) 41 min      Past Medical History  Diagnosis Date  . CAD (coronary artery disease)     A.  s/p CFX in the past;   B.  cath 06/2010: LAD 30-40%, D1 50%, OM1 50%, AVCFX stent 30%, dCFX 70-80% (med Rx), mRCA 40%;      C.  Echo 06/2010: EF 60-65%, mod LVH; mild LAE     . Heart block   . 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 Hypertension   . Multiple myeloma   . Sleep apnea   . Degenerative disc disease   . Venous insufficiency   . Cubital tunnel syndrome   . Atrial flutter     A. s/p prior ablation;  B.  s/p CTI ablation 10/24/10 (Dr. Caryl Comes)    Past Surgical History  Procedure Laterality Date  . Coronary angioplasty with stent placement  2005  . Ventral hernia repair    . Right olecranon nerve surgery      There were no vitals taken for this visit.  Visit Diagnosis:  History of fall  Lack of coordination      Subjective Assessment - 09/22/14 0807    Symptoms Pt reports he is feeling alright, no complaints   Currently in Pain? No/denies          [facility] PT Assessment - 09/22/14 0001    Standardized Balance Assessment   Standardized Balance Assessment Dynamic Gait Index   Dynamic Gait Index   Level Surface Mild Impairment   Change in Gait Speed Mild Impairment   Gait with Horizontal Head Turns Moderate Impairment   Gait with Vertical Head Turns Mild Impairment   Gait  and Pivot Turn Normal   Step Over Obstacle Mild Impairment   Step Around Obstacles Normal   Steps Moderate Impairment   Total Score 16       The patient was taught, performed, and was provided with a home exercise program to address balance impairment. See pt instructions for details.  also completed DGI and goal written                   PT Education - 09/22/14 0939    Education provided Yes   Education Details HEP   Person(s) Educated Patient   Methods Explanation;Demonstration;Handout;Verbal cues   Comprehension Verbalized understanding;Returned demonstration          PT Short Term Goals - 09/22/14 0945    PT SHORT TERM GOAL #1   Title Demonstrate correct performance of HEP to address balance impairment Target: 10/17/14   PT SHORT TERM GOAL #2   Title Complete DGI and write appropriate LTG   Status Achieved   PT SHORT TERM GOAL #3   Title Demonstrate ability to ascend/descend 4 stairs with step over step pattern and without pulling himself up using rail for increased  safety with home access. Target: 10/17/14   PT SHORT TERM GOAL #4   Title Increase Berg Balance Test to 50/56 for decreasing fall risk. Target: 10/17/14   PT SHORT TERM GOAL #5   Title Demonstrate ability to perform TUG without unsteadiness during turn, maintaining time of 12.10 seconds for decreased fall risk Target: 10/17/14           PT Long Term Goals - 09/22/14 0945    PT LONG TERM GOAL #1   Title Verbalize understanding of fall prevention strategies in home environment. Target 11/17/14   PT LONG TERM GOAL #2   Title Dynamic Gait Index long term goal: 20/24 for decreased fall risk . Target 11/17/14   PT LONG TERM GOAL #3   Title Ascend and descend curb with supervision without UE support for improved community access. Target 11/17/14   PT LONG TERM GOAL #4   Title Ambulate 1000' on outdoor, unlevel, grass, concrete, gravel and pinestraw independently for improved ability to ambulate in his yard.  Target 11/17/14   PT LONG TERM GOAL #5   Title Stand on two stacked pillows and turn head right/left without UE support, with eyes closed x 30 seconds without loss of balance for improved vestibular function for balance. Target 11/17/14               Plan - 09/22/14 0940    Clinical Impression Statement Pt provided with HEP today. Expected to progress to goals.   PT Next Visit Plan Balance on compliant surfaces and single limb stance activities, add calf stretch to HEP if needed        Problem List Patient Active Problem List   Diagnosis Date Noted  . Alcohol intoxication in active alcoholic 21/82/8833  . Right-sided muscle weakness 04/28/2013  . 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 Coronary artery disease-nonobstructive catheter in October 2011 12/27/2010  . PACEMAKER, St. Jude dual-chamber 09/14/2010  . MULTIPLE  MYELOMA 07/21/2010  . SMOKER 07/21/2010  . Obstructive sleep apnea 06/12/2009  . Atrioventricular block, heart high-grade 01/29/2009  . SICK SINUS/ TACHY-BRADY SYNDROME 01/29/2009  . Atrial tachycardia/fibrillation 01/14/2009  . SCIATICA 01/14/2009  . Essential hypertension 01/03/2009  . VENOUS INSUFFICIENCY 01/03/2009  . SHORTNESS OF BREATH 01/03/2009  . ALLERGY 01/03/2009    Delrae Sawyers D 09/22/2014, 9:47 AM  [facility] [address] [place], [place], [address] Phone: [phone]   Fax:  [phone]

## 2014-09-26 ENCOUNTER — Ambulatory Visit: Payer: Medicare HMO | Admitting: Physical Therapy

## 2014-09-26 ENCOUNTER — Encounter: Payer: Self-pay | Admitting: Physical Therapy

## 2014-09-26 DIAGNOSIS — R279 Unspecified lack of coordination: Secondary | ICD-10-CM | POA: Diagnosis not present

## 2014-09-26 DIAGNOSIS — Z9181 History of falling: Secondary | ICD-10-CM

## 2014-09-26 NOTE — Therapy (Signed)
[facility] [address] [place], [place], [address] Phone: [phone]   Fax:  [phone]  Physical Therapy Treatment  Patient Details  Name: [name] MRN: [id number] Date of Birth: 1941/06/12 Referring Provider:  Tivis Ringer, MD  Encounter Date: 09/26/2014      PT End of Session - 09/26/14 1021    Visit Number 3   Number of Visits 17   Date for PT Re-Evaluation 11/17/14   Authorization Type [employer]na Medicare G code every 10th visit   PT Start Time 1017   PT Stop Time 1056   PT Time Calculation (min) 39 min   Equipment Utilized During Treatment Gait belt   Activity Tolerance Patient tolerated treatment well   Behavior During Therapy [facility] for tasks assessed/performed      Past Medical History  Diagnosis Date  . CAD (coronary artery disease)     A.  s/p CFX in the past;   B.  cath 06/2010: LAD 30-40%, D1 50%, OM1 50%, AVCFX stent 30%, dCFX 70-80% (med Rx), mRCA 40%;      C.  Echo 06/2010: EF 60-65%, mod LVH; mild LAE     . Heart block   . 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 Hypertension   . Multiple myeloma   . Sleep apnea   . Degenerative disc disease   . Venous insufficiency   . Cubital tunnel syndrome   . Atrial flutter     A. s/p prior ablation;  B.  s/p CTI ablation 10/24/10 (Dr. Caryl Comes)    Past Surgical History  Procedure Laterality Date  . Coronary angioplasty with stent placement  2005  . Ventral hernia repair    . Right olecranon nerve surgery      There were no vitals taken for this visit.  Visit Diagnosis:  History of fall  Lack of coordination      Subjective Assessment - 09/26/14 1019    Symptoms No new complaints. Reports no new falls. Doing HEP some. Limited in that he does not have a place to long enough to perform some of them.   Currently in Pain? No/denies   Multiple Pain Sites No      Treatment:  Neuro Re-ed: - at counter on floor: along  short path to simulate available option at home- toe walk, heel walk, tandem walk x 5 laps each both forward/backward. - at counter on red mats: high knee marches, toe walk, heel walk, tandem walk, all forward/backward x 3 laps each with up to min assist for balance. - at mat: single leg stance activities with tall cones- alternating forward toe taps, cross toe taps, forward double toe taps, cross double toe taps, flip over/up and lateral to forward to floor toe taps x 10 each with up to min assist for balance        PT Short Term Goals - 09/22/14 0945    PT SHORT TERM GOAL #1   Title Demonstrate correct performance of HEP to address balance impairment Target: 10/17/14   PT SHORT TERM GOAL #2   Title Complete DGI and write appropriate LTG   Status Achieved   PT SHORT TERM GOAL #3   Title Demonstrate ability to ascend/descend 4 stairs with step over step pattern and without pulling himself up using rail for increased safety with home access. Target: 10/17/14   PT SHORT TERM GOAL #4   Title Increase Berg Balance Test to 50/56 for decreasing fall  risk. Target: 10/17/14   PT SHORT TERM GOAL #5   Title Demonstrate ability to perform TUG without unsteadiness during turn, maintaining time of 12.10 seconds for decreased fall risk Target: 10/17/14           PT Long Term Goals - 09/22/14 0945    PT LONG TERM GOAL #1   Title Verbalize understanding of fall prevention strategies in home environment. Target 11/17/14   PT LONG TERM GOAL #2   Title Dynamic Gait Index long term goal: 20/24 for decreased fall risk . Target 11/17/14   PT LONG TERM GOAL #3   Title Ascend and descend curb with supervision without UE support for improved community access. Target 11/17/14   PT LONG TERM GOAL #4   Title Ambulate 1000' on outdoor, unlevel, grass, concrete, gravel and pinestraw independently for improved ability to ambulate in his yard. Target 11/17/14   PT LONG TERM GOAL #5   Title Stand on two stacked pillows and turn  head right/left without UE support, with eyes closed x 30 seconds without loss of balance for improved vestibular function for balance. Target 11/17/14           Plan - 09/26/14 1021    Clinical Impression Statement Review current HEP and demo'd/practiced performing in shorter distance in clinic today to better simulate use of pt's dresser at home as he states his kitchen counter is not long enough. Introduced additional balance activites without issues.   Rehab Potential Good   PT Frequency 2x / week   PT Duration 8 weeks   PT Treatment/Interventions ADLs/Self Care Home Management;Gait training;Stair training;Functional mobility training;Therapeutic exercise;Therapeutic activities;Balance training;Neuromuscular re-education;Patient/family education;Energy conservation   PT Next Visit Plan Balance on compliant surfaces and single limb stance activities, add calf stretch to HEP if needed        Problem List Patient Active Problem List   Diagnosis Date Noted  . Alcohol intoxication in active alcoholic 44/96/7591  . Right-sided muscle weakness 04/28/2013  . 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 Coronary artery disease-nonobstructive catheter in October 2011 12/27/2010  . PACEMAKER, St. Jude dual-chamber 09/14/2010  . MULTIPLE  MYELOMA 07/21/2010  . SMOKER 07/21/2010  . Obstructive sleep apnea 06/12/2009  . Atrioventricular block, heart high-grade 01/29/2009  . SICK SINUS/ TACHY-BRADY SYNDROME 01/29/2009  . Atrial tachycardia/fibrillation 01/14/2009  . SCIATICA 01/14/2009  . Essential hypertension 01/03/2009  . VENOUS INSUFFICIENCY 01/03/2009  . SHORTNESS OF BREATH 01/03/2009  . ALLERGY 01/03/2009    Willow Ora 09/26/2014, 12:47 PM  Willow Ora, PTA, [facility] [address], [place], [place] [address] [phone] 09/26/2014, 12:48 PM

## 2014-09-30 ENCOUNTER — Ambulatory Visit: Payer: Medicare HMO

## 2014-09-30 DIAGNOSIS — R279 Unspecified lack of coordination: Secondary | ICD-10-CM | POA: Diagnosis not present

## 2014-09-30 DIAGNOSIS — Z9181 History of falling: Secondary | ICD-10-CM

## 2014-09-30 NOTE — Therapy (Signed)
[facility] [address] [place], [place], [address] Phone: [phone]   Fax:  [phone]  Physical Therapy Treatment  Patient Details  Name: [name] MRN: [id number] Date of Birth: 12-16-1940 Referring Provider:  Tivis Ringer, MD  Encounter Date: 09/30/2014      PT End of Session - 09/30/14 1223    Visit Number 4   Number of Visits 17   Date for PT Re-Evaluation 11/17/14   Authorization Type Aetna Medicare G code every 10th visit   PT Start Time 1018   PT Stop Time 1100   PT Time Calculation (min) 42 min      Past Medical History  Diagnosis Date  . CAD (coronary artery disease)     A.  s/p CFX in the past;   B.  cath 06/2010: LAD 30-40%, D1 50%, OM1 50%, AVCFX stent 30%, dCFX 70-80% (med Rx), mRCA 40%;      C.  Echo 06/2010: EF 60-65%, mod LVH; mild LAE     . Heart block   . 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 Hypertension   . Multiple myeloma   . Sleep apnea   . Degenerative disc disease   . Venous insufficiency   . Cubital tunnel syndrome   . Atrial flutter     A. s/p prior ablation;  B.  s/p CTI ablation 10/24/10 (Dr. Caryl Comes)    Past Surgical History  Procedure Laterality Date  . Coronary angioplasty with stent placement  2005  . Ventral hernia repair    . Right olecranon nerve surgery      There were no vitals taken for this visit.  Visit Diagnosis:  History of fall  Lack of coordination      Subjective Assessment - 09/30/14 1020    Symptoms No new complaints or falls.    Pertinent History Pacemaker, cancer-multiple myeloma, sciatica   Currently in Pain? No/denies      Therex: Calf stretch 3x30 seconds each leg  Neuro re-ed:  Heel walking on complaint mat in parallel bars with BUE support and tactile cues to keep back straight and decrease compensation.  Anterior/posterior limits of stability on  Compliant mat with BUE fingertip support emphasis  on ankle strategy; then performed on rocker board.  Hip strategy minisquats x10 on compliant mat; then on rockerboard with CGA and verbal cues for form.  Ambulation on compliant surface with stepping stones underneath without UE support and supervisoin  Lateral walking on compliant mat x5 laps without UE support; forward tandem walking on compliant surface with intermittent UE support.                       PT Education - 09/30/14 1101    Education provided Yes   Education Details HEP calf stretch   Person(s) Educated Patient   Methods Explanation;Demonstration;Handout   Comprehension Verbalized understanding;Returned demonstration          PT Short Term Goals - 09/22/14 0945    PT SHORT TERM GOAL #1   Title Demonstrate correct performance of HEP to address balance impairment Target: 10/17/14   PT SHORT TERM GOAL #2   Title Complete DGI and write appropriate LTG   Status Achieved   PT SHORT TERM GOAL #3   Title Demonstrate ability to ascend/descend 4 stairs with step over step pattern and without pulling himself up using rail for increased safety with home access. Target: 10/17/14  PT SHORT TERM GOAL #4   Title Increase Berg Balance Test to 50/56 for decreasing fall risk. Target: 10/17/14   PT SHORT TERM GOAL #5   Title Demonstrate ability to perform TUG without unsteadiness during turn, maintaining time of 12.10 seconds for decreased fall risk Target: 10/17/14           PT Long Term Goals - 09/22/14 0945    PT LONG TERM GOAL #1   Title Verbalize understanding of fall prevention strategies in home environment. Target 11/17/14   PT LONG TERM GOAL #2   Title Dynamic Gait Index long term goal: 20/24 for decreased fall risk . Target 11/17/14   PT LONG TERM GOAL #3   Title Ascend and descend curb with supervision without UE support for improved community access. Target 11/17/14   PT LONG TERM GOAL #4   Title Ambulate 1000' on outdoor, unlevel, grass, concrete, gravel and  pinestraw independently for improved ability to ambulate in his yard. Target 11/17/14   PT LONG TERM GOAL #5   Title Stand on two stacked pillows and turn head right/left without UE support, with eyes closed x 30 seconds without loss of balance for improved vestibular function for balance. Target 11/17/14               Plan - 09/30/14 1224    Clinical Impression Statement Pt is demonstrating improved balance today. Continue per POC.   PT Next Visit Plan Continue toward goals        Problem List Patient Active Problem List   Diagnosis Date Noted  . Alcohol intoxication in active alcoholic 28/97/9150  . Right-sided muscle weakness 04/28/2013  . 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 Coronary artery disease-nonobstructive catheter in October 2011 12/27/2010  . PACEMAKER, St. Jude dual-chamber 09/14/2010  . MULTIPLE  MYELOMA 07/21/2010  . SMOKER 07/21/2010  . Obstructive sleep apnea 06/12/2009  . Atrioventricular block, heart high-grade 01/29/2009  . SICK SINUS/ TACHY-BRADY SYNDROME 01/29/2009  . Atrial tachycardia/fibrillation 01/14/2009  . SCIATICA 01/14/2009  . Essential hypertension 01/03/2009  . VENOUS INSUFFICIENCY 01/03/2009  . SHORTNESS OF BREATH 01/03/2009  . ALLERGY 01/03/2009    Delrae Sawyers D 09/30/2014, 12:26 PM  [facility] [address] [place], [place], [address] Phone: [phone]   Fax:  [phone]

## 2014-09-30 NOTE — Patient Instructions (Signed)
Achilles / Gastroc, Standing   Stand, right foot behind, heel on floor and turned slightly out, leg straight, forward leg bent. Move hips forward and keep chest out. Hold 30 seconds. Repeat 3 times in morning and 3x at night on each leg.   Copyright  VHI. All rights reserved.

## 2014-10-02 ENCOUNTER — Encounter: Payer: Self-pay | Admitting: Physical Therapy

## 2014-10-02 ENCOUNTER — Ambulatory Visit: Payer: Medicare HMO | Admitting: Physical Therapy

## 2014-10-02 DIAGNOSIS — Z9181 History of falling: Secondary | ICD-10-CM

## 2014-10-02 DIAGNOSIS — R279 Unspecified lack of coordination: Secondary | ICD-10-CM | POA: Diagnosis not present

## 2014-10-02 NOTE — Therapy (Signed)
[facility] [address] [place], [place], [address] Phone: [phone]   Fax:  [phone]  Physical Therapy Treatment  Patient Details  Name: [name] MRN: [id number] Date of Birth: 10/10/1940 Referring Provider:  Tivis Ringer, MD  Encounter Date: 10/02/2014      PT End of Session - 10/02/14 1021    Visit Number 5   Number of Visits 17   Date for PT Re-Evaluation 11/17/14   Authorization Type Aetna Medicare G code every 10th visit   PT Start Time 1017   PT Stop Time 1100   PT Time Calculation (min) 43 min   Equipment Utilized During Treatment Gait belt   Activity Tolerance Patient tolerated treatment well   Behavior During Therapy [facility] for tasks assessed/performed      Past Medical History  Diagnosis Date  . CAD (coronary artery disease)     A.  s/p CFX in the past;   B.  cath 06/2010: LAD 30-40%, D1 50%, OM1 50%, AVCFX stent 30%, dCFX 70-80% (med Rx), mRCA 40%;      C.  Echo 06/2010: EF 60-65%, mod LVH; mild LAE     . Heart block   . 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 Hypertension   . Multiple myeloma   . Sleep apnea   . Degenerative disc disease   . Venous insufficiency   . Cubital tunnel syndrome   . Atrial flutter     A. s/p prior ablation;  B.  s/p CTI ablation 10/24/10 (Dr. Caryl Comes)    Past Surgical History  Procedure Laterality Date  . Coronary angioplasty with stent placement  2005  . Ventral hernia repair    . Right olecranon nerve surgery      There were no vitals taken for this visit.  Visit Diagnosis:  History of fall  Lack of coordination      Subjective Assessment - 10/02/14 1020    Symptoms No new complaints or falls. No pain. Doing HEP without issues.   Currently in Pain? No/denies     Treatment:  Neuro re-ed: - red/blue mats at counter: high knee marches forward/backwards, toe walk forward/backwards, heel walk forwards/backwards x 3 laps  each with up to min assist for balance without UE support.  - blue foam beam in parallel bars: side stepping left/right and tandem gait forward/backward x 3-4 laps each with intermittent UE assist on bars; tandem stance: alternating heel taps to side/floor and back on x 5 reps bilaterally with each foot forward; tandem stance with head turns and head nods x 10 each with each foot forward; standing with feet across beam: alternating forward heel taps x 10 each and alternating backward toe taps x 10 each. Intermittent UE support needed at times on bars.  - balance board in parallel bars: hold with eyes open, hold with eyes closed, hold with head turns/nods with eyes open, performed both ways on the board with min assist and occasional UE support on the bars. Sit/stands with feet on balance board x 10 reps each way on board.        PT Short Term Goals - 09/22/14 0945    PT SHORT TERM GOAL #1   Title Demonstrate correct performance of HEP to address balance impairment Target: 10/17/14   PT SHORT TERM GOAL #2   Title Complete DGI and write appropriate LTG   Status Achieved   PT SHORT TERM GOAL #3   Title Demonstrate  ability to ascend/descend 4 stairs with step over step pattern and without pulling himself up using rail for increased safety with home access. Target: 10/17/14   PT SHORT TERM GOAL #4   Title Increase Berg Balance Test to 50/56 for decreasing fall risk. Target: 10/17/14   PT SHORT TERM GOAL #5   Title Demonstrate ability to perform TUG without unsteadiness during turn, maintaining time of 12.10 seconds for decreased fall risk Target: 10/17/14           PT Long Term Goals - 09/22/14 0945    PT LONG TERM GOAL #1   Title Verbalize understanding of fall prevention strategies in home environment. Target 11/17/14   PT LONG TERM GOAL #2   Title Dynamic Gait Index long term goal: 20/24 for decreased fall risk . Target 11/17/14   PT LONG TERM GOAL #3   Title Ascend and descend curb with  supervision without UE support for improved community access. Target 11/17/14   PT LONG TERM GOAL #4   Title Ambulate 1000' on outdoor, unlevel, grass, concrete, gravel and pinestraw independently for improved ability to ambulate in his yard. Target 11/17/14   PT LONG TERM GOAL #5   Title Stand on two stacked pillows and turn head right/left without UE support, with eyes closed x 30 seconds without loss of balance for improved vestibular function for balance. Target 11/17/14           Plan - 10/02/14 1021    Clinical Impression Statement Pt continues to demonstrate improvements in balance. Progressing toward goals.   Rehab Potential Good   PT Frequency 2x / week   PT Duration 8 weeks   PT Treatment/Interventions ADLs/Self Care Home Management;Gait training;Stair training;Functional mobility training;Therapeutic exercise;Therapeutic activities;Balance training;Neuromuscular re-education;Patient/family education;Energy conservation   PT Next Visit Plan Balance on compliant surfaces and single limb stance activities.        Problem List Patient Active Problem List   Diagnosis Date Noted  . Alcohol intoxication in active alcoholic 17/40/9927  . Right-sided muscle weakness 04/28/2013  . 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 Coronary artery disease-nonobstructive catheter in October 2011 12/27/2010  . PACEMAKER, St. Jude dual-chamber 09/14/2010  . MULTIPLE  MYELOMA 07/21/2010  . SMOKER 07/21/2010  . Obstructive sleep apnea 06/12/2009  . Atrioventricular block, heart high-grade 01/29/2009  . SICK SINUS/ TACHY-BRADY SYNDROME 01/29/2009  . Atrial tachycardia/fibrillation 01/14/2009  . SCIATICA 01/14/2009  . Essential hypertension 01/03/2009  . VENOUS INSUFFICIENCY 01/03/2009  . SHORTNESS OF BREATH 01/03/2009  . ALLERGY 01/03/2009    Willow Ora 10/02/2014, 1:07 PM  Willow Ora, PTA, [facility] [address] [place],  [place] [address] [phone] 10/02/2014, 1:07 PM

## 2014-10-07 ENCOUNTER — Ambulatory Visit: Payer: Medicare HMO | Admitting: Physical Therapy

## 2014-10-07 ENCOUNTER — Encounter: Payer: Self-pay | Admitting: Physical Therapy

## 2014-10-07 DIAGNOSIS — R279 Unspecified lack of coordination: Secondary | ICD-10-CM

## 2014-10-07 DIAGNOSIS — Z9181 History of falling: Secondary | ICD-10-CM

## 2014-10-07 NOTE — Therapy (Signed)
[facility] [address] [place], [place], [address] Phone: [phone]   Fax:  [phone]  Physical Therapy Treatment  Patient Details  Name: [name] MRN: [id number] Date of Birth: 07-04-1941 Referring Provider:  Tivis Ringer, MD  Encounter Date: 10/07/2014      PT End of Session - 10/07/14 1019    Visit Number 6   Number of Visits 17   Date for PT Re-Evaluation 11/17/14   Authorization Type [employer] G code every 10th visit   PT Start Time 1015   PT Stop Time 1058   PT Time Calculation (min) 43 min   Equipment Utilized During Treatment Gait belt   Activity Tolerance Patient tolerated treatment well   Behavior During Therapy [facility] for tasks assessed/performed      Past Medical History  Diagnosis Date  . CAD (coronary artery disease)     A.  s/p CFX in the past;   B.  cath 06/2010: LAD 30-40%, D1 50%, OM1 50%, AVCFX stent 30%, dCFX 70-80% (med Rx), mRCA 40%;      C.  Echo 06/2010: EF 60-65%, mod LVH; mild LAE     . Heart block   . 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 Hypertension   . Multiple myeloma   . Sleep apnea   . Degenerative disc disease   . Venous insufficiency   . Cubital tunnel syndrome   . Atrial flutter     A. s/p prior ablation;  B.  s/p CTI ablation 10/24/10 (Dr. Caryl Comes)    Past Surgical History  Procedure Laterality Date  . Coronary angioplasty with stent placement  2005  . Ventral hernia repair    . Right olecranon nerve surgery      There were no vitals taken for this visit.  Visit Diagnosis:  History of fall  Lack of coordination      Subjective Assessment - 10/07/14 1019    Symptoms No new complaints or falls. No pain to report.    Currently in Pain? No/denies      Treatment: Neuro Re-ed: - gait with head turns and head nods with emphasis on maintaining straight pathway and not veering with head movements, supervision to min guard  assist.  - single leg stance activities on blue mat: alternating fwd taps, cross taps, fwd double taps, cross double taps, and flip over/up x 10 reps with up to mod assist for balance.  - blue foam beam: side stepping left/right, tandem gait x 3 laps each/each way with intermittent UE assist on parallel bars. Standing with feet across beam: alternating fwd foot taps to ground and then alternating backwards toe taps x 10 each foot/each way with intermittent UE support on parallel bars. Seated with feet across beam: sit/stands x 10 reps with no UE assist and min assist for balance/stability with initial standing.  - tandem gait on solid surface (40 foot path) forward/backwards x 2 laps each way with min assist.  - stepping over hurdles one foot to each hurdle (reciprocal pattern step overs) x 6 laps with mod assist initially progressing to min assist over course of reps.        PT Short Term Goals - 09/22/14 0945    PT SHORT TERM GOAL #1   Title Demonstrate correct performance of HEP to address balance impairment Target: 10/17/14   PT SHORT TERM GOAL #2   Title Complete DGI and write appropriate LTG   Status Achieved  PT SHORT TERM GOAL #3   Title Demonstrate ability to ascend/descend 4 stairs with step over step pattern and without pulling himself up using rail for increased safety with home access. Target: 10/17/14   PT SHORT TERM GOAL #4   Title Increase Berg Balance Test to 50/56 for decreasing fall risk. Target: 10/17/14   PT SHORT TERM GOAL #5   Title Demonstrate ability to perform TUG without unsteadiness during turn, maintaining time of 12.10 seconds for decreased fall risk Target: 10/17/14           PT Long Term Goals - 09/22/14 0945    PT LONG TERM GOAL #1   Title Verbalize understanding of fall prevention strategies in home environment. Target 11/17/14   PT LONG TERM GOAL #2   Title Dynamic Gait Index long term goal: 20/24 for decreased fall risk . Target 11/17/14   PT LONG TERM  GOAL #3   Title Ascend and descend curb with supervision without UE support for improved community access. Target 11/17/14   PT LONG TERM GOAL #4   Title Ambulate 1000' on outdoor, unlevel, grass, concrete, gravel and pinestraw independently for improved ability to ambulate in his yard. Target 11/17/14   PT LONG TERM GOAL #5   Title Stand on two stacked pillows and turn head right/left without UE support, with eyes closed x 30 seconds without loss of balance for improved vestibular function for balance. Target 11/17/14           Plan - 10/07/14 1019    Clinical Impression Statement Pt progressing well toward goals.    Rehab Potential Good   PT Frequency 2x / week   PT Duration 8 weeks   PT Treatment/Interventions ADLs/Self Care Home Management;Gait training;Stair training;Functional mobility training;Therapeutic exercise;Therapeutic activities;Balance training;Neuromuscular re-education;Patient/family education;Energy conservation   PT Next Visit Plan Balance on compliant surfaces and single limb stance activities.        Problem List Patient Active Problem List   Diagnosis Date Noted  . Alcohol intoxication in active alcoholic 57/97/2820  . Right-sided muscle weakness 04/28/2013  . 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 Coronary artery disease-nonobstructive catheter in October 2011 12/27/2010  . PACEMAKER, St. Jude dual-chamber 09/14/2010  . MULTIPLE  MYELOMA 07/21/2010  . SMOKER 07/21/2010  . Obstructive sleep apnea 06/12/2009  . Atrioventricular block, heart high-grade 01/29/2009  . SICK SINUS/ TACHY-BRADY SYNDROME 01/29/2009  . Atrial tachycardia/fibrillation 01/14/2009  . SCIATICA 01/14/2009  . Essential hypertension 01/03/2009  . VENOUS INSUFFICIENCY 01/03/2009  . SHORTNESS OF BREATH 01/03/2009  . ALLERGY 01/03/2009    Willow Ora 10/07/2014, 11:55 AM  Willow Ora, PTA, [facility] [address] [place], [place] [address] [phone] 10/07/2014, 11:55 AM

## 2014-10-09 ENCOUNTER — Ambulatory Visit: Payer: Medicare HMO

## 2014-10-09 DIAGNOSIS — R279 Unspecified lack of coordination: Secondary | ICD-10-CM | POA: Diagnosis not present

## 2014-10-09 DIAGNOSIS — Z9181 History of falling: Secondary | ICD-10-CM

## 2014-10-09 NOTE — Therapy (Signed)
[facility] [address] [place], [place], [address] Phone: [phone]   Fax:  [phone]  Physical Therapy Treatment  Patient Details  Name: [name] MRN: [id number] Date of Birth: 02/24/1941 Referring Provider:  Tivis Ringer, MD  Encounter Date: 10/09/2014      PT End of Session - 10/09/14 1313    Visit Number 7   Number of Visits 17   Date for PT Re-Evaluation 11/17/14   Authorization Type [employer] Medicare G code every 10th visit   PT Start Time 1018   PT Stop Time 1101   PT Time Calculation (min) 43 min      Past Medical History  Diagnosis Date  . CAD (coronary artery disease)     A.  s/p CFX in the past;   B.  cath 06/2010: LAD 30-40%, D1 50%, OM1 50%, AVCFX stent 30%, dCFX 70-80% (med Rx), mRCA 40%;      C.  Echo 06/2010: EF 60-65%, mod LVH; mild LAE     . Heart block   . 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 Hypertension   . Multiple myeloma   . Sleep apnea   . Degenerative disc disease   . Venous insufficiency   . Cubital tunnel syndrome   . Atrial flutter     A. s/p prior ablation;  B.  s/p CTI ablation 10/24/10 (Dr. Caryl Comes)    Past Surgical History  Procedure Laterality Date  . Coronary angioplasty with stent placement  2005  . Ventral hernia repair    . Right olecranon nerve surgery      There were no vitals taken for this visit.  Visit Diagnosis:  History of fall  Lack of coordination  3 laps at countertop each with fingertip support: Forward and backward walking with eyes closed, then eyes closed and head nods; lateral walking with eyes closed, then with eyes closed and head nods   3 laps on compliant surface with fingertip support: Braiding with eyes open, then with eyes closed; high marching forward/backward, lateral stepping with ball toss  Compliant mat: static standing 2x30 seconds with eyes closed and head up, anterior/posterior limits of  stability with eyes closed and min A 3 sets to form fatigue, 3 360 degree turns eyes open, then eyes closed with MIN A  Theract: Demonstrated modified independence with lowering to the ground and rising from the ground with UE support on external object  Stairs: practiced ascending/descend stairs with step-to and step over step pattern with UE support. Pt's left knee noted to have poor eccentric  Control.                          PT Short Term Goals - 09/22/14 0945    PT SHORT TERM GOAL #1   Title Demonstrate correct performance of HEP to address balance impairment Target: 10/17/14   PT SHORT TERM GOAL #2   Title Complete DGI and write appropriate LTG   Status Achieved   PT SHORT TERM GOAL #3   Title Demonstrate ability to ascend/descend 4 stairs with step over step pattern and without pulling himself up using rail for increased safety with home access. Target: 10/17/14   PT SHORT TERM GOAL #4   Title Increase Berg Balance Test to 50/56 for decreasing fall risk. Target: 10/17/14   PT SHORT TERM GOAL #5   Title Demonstrate ability to perform TUG without unsteadiness during turn,  maintaining time of 12.10 seconds for decreased fall risk Target: 10/17/14           PT Long Term Goals - 09/22/14 0945    PT LONG TERM GOAL #1   Title Verbalize understanding of fall prevention strategies in home environment. Target 11/17/14   PT LONG TERM GOAL #2   Title Dynamic Gait Index long term goal: 20/24 for decreased fall risk . Target 11/17/14   PT LONG TERM GOAL #3   Title Ascend and descend curb with supervision without UE support for improved community access. Target 11/17/14   PT LONG TERM GOAL #4   Title Ambulate 1000' on outdoor, unlevel, grass, concrete, gravel and pinestraw independently for improved ability to ambulate in his yard. Target 11/17/14   PT LONG TERM GOAL #5   Title Stand on two stacked pillows and turn head right/left without UE support, with eyes closed x 30 seconds  without loss of balance for improved vestibular function for balance. Target 11/17/14               Plan - 10/09/14 1702    Clinical Impression Statement Pt is making great progress toward goals. Continue per plan.   PT Next Visit Plan Begin checking STGs and determine if more appointments will be needed. *work on eccentric quad control for improved stair negotiation*        Problem List Patient Active Problem List   Diagnosis Date Noted  . Alcohol intoxication in active alcoholic 35/32/9924  . Right-sided muscle weakness 04/28/2013  . 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 Coronary artery disease-nonobstructive catheter in October 2011 12/27/2010  . PACEMAKER, St. Jude dual-chamber 09/14/2010  . MULTIPLE  MYELOMA 07/21/2010  . SMOKER 07/21/2010  . Obstructive sleep apnea 06/12/2009  . Atrioventricular block, heart high-grade 01/29/2009  . SICK SINUS/ TACHY-BRADY SYNDROME 01/29/2009  . Atrial tachycardia/fibrillation 01/14/2009  . SCIATICA 01/14/2009  . Essential hypertension 01/03/2009  . VENOUS INSUFFICIENCY 01/03/2009  . SHORTNESS OF BREATH 01/03/2009  . ALLERGY 01/03/2009    Delrae Sawyers D 10/09/2014, 5:05 PM  [facility] [address] [place], [place], [address] Phone: [phone]   Fax:  [phone]

## 2014-10-14 ENCOUNTER — Encounter: Payer: Self-pay | Admitting: Physical Therapy

## 2014-10-14 ENCOUNTER — Ambulatory Visit: Payer: Medicare HMO | Attending: Internal Medicine | Admitting: Physical Therapy

## 2014-10-14 DIAGNOSIS — Z9181 History of falling: Secondary | ICD-10-CM

## 2014-10-14 DIAGNOSIS — R279 Unspecified lack of coordination: Secondary | ICD-10-CM

## 2014-10-16 ENCOUNTER — Ambulatory Visit: Payer: Medicare HMO

## 2014-10-16 DIAGNOSIS — R279 Unspecified lack of coordination: Secondary | ICD-10-CM

## 2014-10-16 DIAGNOSIS — Z9181 History of falling: Secondary | ICD-10-CM

## 2014-10-16 NOTE — Therapy (Signed)
[facility] [address] [place], [place], [address] Phone: [phone]   Fax:  [phone]  Physical Therapy Treatment  Patient Details  Name: [name] MRN: [id number] Date of Birth: 07-06-1941 Referring Provider:  Tivis Ringer, MD  Encounter Date: 10/14/2014    Past Medical History  Diagnosis Date  . CAD (coronary artery disease)     A.  s/p CFX in the past;   B.  cath 06/2010: LAD 30-40%, D1 50%, OM1 50%, AVCFX stent 30%, dCFX 70-80% (med Rx), mRCA 40%;      C.  Echo 06/2010: EF 60-65%, mod LVH; mild LAE     . Heart block   . 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 Hypertension   . Multiple myeloma   . Sleep apnea   . Degenerative disc disease   . Venous insufficiency   . Cubital tunnel syndrome   . Atrial flutter     A. s/p prior ablation;  B.  s/p CTI ablation 10/24/10 (Dr. Caryl Comes)    Past Surgical History  Procedure Laterality Date  . Coronary angioplasty with stent placement  2005  . Ventral hernia repair    . Right olecranon nerve surgery      There were no vitals taken for this visit.  Visit Diagnosis:  History of fall  Lack of coordination     10/14/14 1023  Ambulation/Gait  Ambulation/Gait Yes  Ambulation/Gait Assistance 6: Modified independent (Device/Increase time)  Ambulation Distance (Feet) (> 1000 feet)  Assistive device None  Gait Pattern WFL  Ambulation Surface Level;Unlevel;Indoor;Outdoor;Paved;Gravel;Grass (pine needles)  Curb 6: Modified independent (Device/increase time) (no device, increased time to manage)  Merrilee Jansky Balance Test  Sit to Stand 4  Standing Unsupported 4  Sitting with Back Unsupported but Feet Supported on Floor or Stool 4  Stand to Sit 4  Transfers 4  Standing Unsupported with Eyes Closed 4  Standing Ubsupported with Feet Together 3  From Standing, Reach Forward with Outstretched Arm 4  From Standing Position, Pick up Object from  Floor 4  From Standing Position, Turn to Look Behind Over each Shoulder 4  Turn 360 Degrees 4  Standing Unsupported, Alternately Place Feet on Step/Stool 4 (8.53 sec's)  Standing Unsupported, One Foot in Front 3  Standing on One Leg 1  Total Score 51  Dynamic Gait Index  Level Surface 3  Change in Gait Speed 3  Gait with Horizontal Head Turns 3  Gait with Vertical Head Turns 3  Gait and Pivot Turn 3  Step Over Obstacle 3  Step Around Obstacles 3  Steps 1 (reciprocal up, step to down with rail)  Total Score 22  Timed Up and Go Test  Normal TUG (seconds) 9.25 (no device)          PT Short Term Goals - 10/14/14 1600    PT SHORT TERM GOAL #1   Title Demonstrate correct performance of HEP to address balance impairment Target: 10/17/14   Status Achieved   PT SHORT TERM GOAL #2   Title Complete DGI and write appropriate LTG   Status Achieved   PT SHORT TERM GOAL #3   Title Demonstrate ability to ascend/descend 4 stairs with step over step pattern and without pulling himself up using rail for increased safety with home access. Target: 10/17/14   Status Achieved   PT SHORT TERM GOAL #4   Title Increase Berg Balance Test to 50/56 for decreasing fall risk. Target: 10/17/14  Status Achieved   PT SHORT TERM GOAL #5   Title Demonstrate ability to perform TUG without unsteadiness during turn, maintaining time of 12.10 seconds for decreased fall risk Target: 10/17/14   Status Achieved           PT Long Term Goals - 10/14/14 1254    PT LONG TERM GOAL #1   Title Verbalize understanding of fall prevention strategies in home environment. Target 11/17/14   Status On-going   PT LONG TERM GOAL #2   Title Dynamic Gait Index long term goal: 20/24 for decreased fall risk . Target 11/17/14   Status Achieved   PT LONG TERM GOAL #3   Title Ascend and descend curb with supervision without UE support for improved community access. Target 11/17/14   Status Achieved   PT LONG TERM GOAL #4   Title  Ambulate 1000' on outdoor, unlevel, grass, concrete, gravel and pinestraw independently for improved ability to ambulate in his yard. Target 11/17/14   Status Achieved   PT LONG TERM GOAL #5   Title Stand on two stacked pillows and turn head right/left without UE support, with eyes closed x 30 seconds without loss of balance for improved vestibular function for balance. Target 11/17/14   Status Achieved       Problem List Patient Active Problem List   Diagnosis Date Noted  . Alcohol intoxication in active alcoholic 83/23/4688  . Right-sided muscle weakness 04/28/2013  . 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 Coronary artery disease-nonobstructive catheter in October 2011 12/27/2010  . PACEMAKER, St. Jude dual-chamber 09/14/2010  . MULTIPLE  MYELOMA 07/21/2010  . SMOKER 07/21/2010  . Obstructive sleep apnea 06/12/2009  . Atrioventricular block, heart high-grade 01/29/2009  . SICK SINUS/ TACHY-BRADY SYNDROME 01/29/2009  . Atrial tachycardia/fibrillation 01/14/2009  . SCIATICA 01/14/2009  . Essential hypertension 01/03/2009  . VENOUS INSUFFICIENCY 01/03/2009  . SHORTNESS OF BREATH 01/03/2009  . ALLERGY 01/03/2009    Willow Ora 10/16/2014, 8:23 AM  Willow Ora, PTA, [facility] [address], [place], [place] [address] [phone] 10/16/2014, 8:23 AM

## 2014-10-16 NOTE — Patient Instructions (Signed)
It is important to avoid accidents which may result in broken bones.  Here are a few ideas on how to make your home safer so you will be less likely to trip or fall.  1. Use nonskid mats or non slip strips in your shower or tub, on your bathroom floor and around sinks.  If you know that you have spilled water, wipe it up! 2. In the bathroom, it is important to have properly installed grab bars on the walls or on the edge of the tub.  Towel racks are NOT strong enough for you to hold onto or to pull on for support. 3. Stairs and hallways should have enough light.  Add lamps or night lights if you need ore light. 4. It is good to have handrails on both sides of the stairs if possible.  Always fix broken handrails right away. 5. It is important to see the edges of steps.  Paint the edges of outdoor steps white so you can see them better.  Put colored tape on the edge of inside steps. 6. Throw-rugs are dangerous because they can slide.  Removing the rugs is the best idea, but if they must stay, add adhesive carpet tape to prevent slipping. 7. Do not keep things on stairs or in the halls.  Remove small furniture that blocks the halls as it may cause you to trip.  Keep telephone and electrical cords out of the way where you walk. 8. Always were sturdy, rubber-soled shoes for good support.  Never wear just socks, especially on the stairs.  Socks may cause you to slip or fall.  Do not wear full-length housecoats as you can easily trip on the bottom.  9. Place the things you use the most on the shelves that are the easiest to reach.  If you use a stepstool, make sure it is in good condition.  If you feel unsteady, DO NOT climb, ask for help. If a health professional advises you to use a cane or walker, do not be ashamed.  These items can keep you from falling and breaking your bones.  Healthy habits: 1. Try to stay away from pre-packaged foods as these are highly processed and more difficult for your body to  process, and have less nutrients 2. Choose meats that are low in fat/cholesterol such as poultry and fish 3. Decrease intake of sugar/flour/baked goods 4. Emphasize fruits/vegetables in your diet.  If you are very serious about making a visible difference in your wellness: 1. Stop drinking sodas of all kinds, and decrease beer intake 2. Go for a brisk walk 4-5 days per week for 30 minutes a day

## 2014-10-16 NOTE — Therapy (Signed)
[facility] [address] [place], [place], [address] Phone: [phone]   Fax:  [phone]  Physical Therapy Treatment  Patient Details  Name: [name] MRN: [id number] Date of Birth: [birth year] Referring Provider:  Tivis Ringer, MD  Encounter Date: 10/16/2014      PT End of Session - 10/16/14 0921    Visit Number 9   Number of Visits 17   Date for PT Re-Evaluation 11/17/14   Authorization Type [employer] G code every 10th visit   PT Start Time [phone]   PT Stop Time 0920   PT Time Calculation (min) 31 min      Past Medical History  Diagnosis Date  . CAD (coronary artery disease)     A.  s/p CFX in the past;   B.  cath 06/2010: LAD 30-40%, D1 50%, OM1 50%, AVCFX stent 30%, dCFX 70-80% (med Rx), mRCA 40%;      C.  Echo 06/2010: EF 60-65%, mod LVH; mild LAE     . Heart block   . 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 Hypertension   . Multiple myeloma   . Sleep apnea   . Degenerative disc disease   . Venous insufficiency   . Cubital tunnel syndrome   . Atrial flutter     A. s/p prior ablation;  B.  s/p CTI ablation 10/24/10 (Dr. Caryl Comes)    Past Surgical History  Procedure Laterality Date  . Coronary angioplasty with stent placement  2005  . Ventral hernia repair    . Right olecranon nerve surgery      There were no vitals taken for this visit.  Visit Diagnosis:  History of fall  Lack of coordination      Subjective Assessment - 10/16/14 0904    Symptoms I'd like to lose some weight. Do you have any ideas?   Currently in Pain? No/denies      31 minutes self care:  Included recommendation to perform PT balance exercises 2-3 days per week Fall prevention education-see handout in pt instructions Health habits education-see handout                      PT Education - 10/16/14 [phone]    Education provided Yes   Education Details fall prevention and  healthy habits   Person(s) Educated Patient   Methods Explanation;Demonstration;Handout   Comprehension Verbalized understanding          PT Short Term Goals - 10/14/14 1600    PT SHORT TERM GOAL #1   Title Demonstrate correct performance of HEP to address balance impairment Target: 10/17/14   Status Achieved   PT SHORT TERM GOAL #2   Title Complete DGI and write appropriate LTG   Status Achieved   PT SHORT TERM GOAL #3   Title Demonstrate ability to ascend/descend 4 stairs with step over step pattern and without pulling himself up using rail for increased safety with home access. Target: 10/17/14   Status Achieved   PT SHORT TERM GOAL #4   Title Increase Berg Balance Test to 50/56 for decreasing fall risk. Target: 10/17/14   Status Achieved   PT SHORT TERM GOAL #5   Title Demonstrate ability to perform TUG without unsteadiness during turn, maintaining time of 12.10 seconds for decreased fall risk Target: 10/17/14   Status Achieved           PT Long Term Goals - 10/16/14 0947  PT LONG TERM GOAL #1   Title Verbalize understanding of fall prevention strategies in home environment. Target 11/17/14   Status Achieved   PT LONG TERM GOAL #2   Title Dynamic Gait Index long term goal: 20/24 for decreased fall risk . Target 11/17/14   Status Achieved   PT LONG TERM GOAL #3   Title Ascend and descend curb with supervision without UE support for improved community access. Target 11/17/14   Status Achieved   PT LONG TERM GOAL #4   Title Ambulate 1000' on outdoor, unlevel, grass, concrete, gravel and pinestraw independently for improved ability to ambulate in his yard. Target 11/17/14   Status Achieved   PT LONG TERM GOAL #5   Title Stand on two stacked pillows and turn head right/left without UE support, with eyes closed x 30 seconds without loss of balance for improved vestibular function for balance. Target 11/17/14   Status Achieved               Plan - November 04, 2014 0354    Clinical  Impression Statement Pt met all goals and is ready for d/c today.   PT Next Visit Plan d/c today          G-Codes - 2014-11-04 0929    Functional Assessment Tool Used Merrilee Jansky and DGI   Functional Limitation Mobility: Walking and moving around   Mobility: Walking and Moving Around Goal Status [phone]) At least 1 percent but less than 20 percent impaired, limited or restricted   Mobility: Walking and Moving Around Discharge Status [phone]) At least 1 percent but less than 20 percent impaired, limited or restricted      Problem List Patient Active Problem List   Diagnosis Date Noted  . Alcohol intoxication in active alcoholic 00/17/4944  . Right-sided muscle weakness 04/28/2013  . 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 Coronary artery disease-nonobstructive catheter in October 2011 12/27/2010  . PACEMAKER, [employer]. Jude dual-chamber 09/14/2010  . MULTIPLE  MYELOMA 07/21/2010  . SMOKER 07/21/2010  . Obstructive sleep apnea 06/12/2009  . Atrioventricular block, heart high-grade 01/29/2009  . SICK SINUS/ TACHY-BRADY SYNDROME 01/29/2009  . Atrial tachycardia/fibrillation 01/14/2009  . SCIATICA 01/14/2009  . Essential hypertension 01/03/2009  . VENOUS INSUFFICIENCY 01/03/2009  . SHORTNESS OF BREATH 01/03/2009  . ALLERGY 01/03/2009    Delrae Sawyers D 11/04/2014, 9:30 AM  [facility] [address] [place], [place], [address] Phone: [phone]   Fax:  [phone]   PHYSICAL THERAPY DISCHARGE SUMMARY  Visits from Start of Care: 9  Current functional level related to goals / functional outcomes: See goals section above. Pt met all therapy goals.   Remaining deficits: No notable deficits.   Education / Equipment: Fall prevention ed, home exercise program, general wellness education  Plan: Patient agrees to discharge.  Patient goals were met. Patient is being discharged due to meeting the  stated rehab goals.  ?????     Delrae Sawyers, PT,DPT,NCS 11-04-2014 9:30 AM Phone [phone] FAX [phone]

## 2014-11-12 ENCOUNTER — Ambulatory Visit (INDEPENDENT_AMBULATORY_CARE_PROVIDER_SITE_OTHER): Payer: Medicare HMO | Admitting: *Deleted

## 2014-11-12 ENCOUNTER — Ambulatory Visit (AMBULATORY_SURGERY_CENTER): Payer: Self-pay | Admitting: *Deleted

## 2014-11-12 VITALS — Ht 71.0 in | Wt 226.8 lb

## 2014-11-12 DIAGNOSIS — Z86010 Personal history of colonic polyps: Secondary | ICD-10-CM

## 2014-11-12 DIAGNOSIS — I495 Sick sinus syndrome: Secondary | ICD-10-CM

## 2014-11-12 LAB — MDC_IDC_ENUM_SESS_TYPE_REMOTE
Battery Remaining Longevity: 91 mo
Battery Remaining Percentage: 67 %
Battery Voltage: 2.93 V
Brady Statistic AP VP Percent: 73 %
Brady Statistic AS VP Percent: 27 %
Brady Statistic RV Percent Paced: 99 %
Date Time Interrogation Session: 20160302133818
Implantable Pulse Generator Model: 2110
Lead Channel Impedance Value: 460 Ohm
Lead Channel Pacing Threshold Amplitude: 0.5 V
Lead Channel Pacing Threshold Pulse Width: 0.3 ms
Lead Channel Sensing Intrinsic Amplitude: 12 mV
Lead Channel Setting Pacing Amplitude: 1.5 V
Lead Channel Setting Sensing Sensitivity: 2 mV
MDC IDC MSMT LEADCHNL RA IMPEDANCE VALUE: 440 Ohm
MDC IDC MSMT LEADCHNL RA SENSING INTR AMPL: 2.1 mV
MDC IDC MSMT LEADCHNL RV PACING THRESHOLD AMPLITUDE: 0.75 V
MDC IDC MSMT LEADCHNL RV PACING THRESHOLD PULSEWIDTH: 0.4 ms
MDC IDC PG SERIAL: 7163077
MDC IDC SET LEADCHNL RV PACING AMPLITUDE: 1 V
MDC IDC SET LEADCHNL RV PACING PULSEWIDTH: 0.4 ms
MDC IDC STAT BRADY AP VS PERCENT: 1 %
MDC IDC STAT BRADY AS VS PERCENT: 1 %
MDC IDC STAT BRADY RA PERCENT PACED: 73 %

## 2014-11-12 MED ORDER — NA SULFATE-K SULFATE-MG SULF 17.5-3.13-1.6 GM/177ML PO SOLN: 1.0000 | Freq: Once | ORAL | Status: DC

## 2014-11-12 NOTE — Progress Notes (Signed)
No egg or soy  allergy No diet pills No home 02 use No issues with past sedation emmi video declined , states has had many colons

## 2014-11-12 NOTE — Progress Notes (Signed)
Remote pacemaker transmission.   

## 2014-11-26 ENCOUNTER — Encounter: Payer: Self-pay | Admitting: Gastroenterology

## 2014-11-26 ENCOUNTER — Ambulatory Visit (AMBULATORY_SURGERY_CENTER): Payer: Medicare HMO | Admitting: Gastroenterology

## 2014-11-26 VITALS — BP 133/66 | HR 67 | Temp 97.5°F | Resp 24 | Ht 71.0 in | Wt 226.0 lb

## 2014-11-26 DIAGNOSIS — K5730 Diverticulosis of large intestine without perforation or abscess without bleeding: Secondary | ICD-10-CM

## 2014-11-26 DIAGNOSIS — Z86010 Personal history of colonic polyps: Secondary | ICD-10-CM

## 2014-11-26 DIAGNOSIS — Z1211 Encounter for screening for malignant neoplasm of colon: Secondary | ICD-10-CM | POA: Diagnosis not present

## 2014-11-26 MED ORDER — SODIUM CHLORIDE 0.9 % IV SOLN: 500.0000 mL | INTRAVENOUS | Status: DC

## 2014-11-26 NOTE — Op Note (Addendum)
[facility]  [address] [place], [address]   COLONOSCOPY PROCEDURE REPORT  PATIENT: [name], [name]  MR#: [id number] BIRTHDATE: 1941/03/22 , 73  yrs. old GENDER: male ENDOSCOPIST: Inda [place], MD REFERRED NF:[id number] Avva, M.D. PROCEDURE DATE:  11/26/2014 PROCEDURE:   Colonoscopy, surveillance First Screening Colonoscopy - Avg.  risk and is 50 yrs.  old or older - No.  Prior Negative Screening - Now for repeat screening. N/A  History of Adenoma - Now for follow-up colonoscopy & has been > or = to 3 yrs.  Yes hx of adenoma.  Has been 3 or more years since last colonoscopy. ASA CLASS:   Class II INDICATIONS:PH Colon Adenoma. 2011 MEDICATIONS: Monitored anesthesia care and Propofol 200 mg IV  DESCRIPTION OF PROCEDURE:   After the risks benefits and alternatives of the procedure were thoroughly explained, informed consent was obtained.  The digital rectal exam revealed no abnormalities of the rectum.   The LB CF-H180AL Loaner [id number] endoscope was introduced through the anus and advanced to the ileum. No adverse events experienced.   The quality of the prep was (Suprep was used) excellent.  The instrument was then slowly withdrawn as the colon was fully examined.      COLON FINDINGS: There was severe diverticulosis noted in the descending colon with associated luminal narrowing and muscular hypertrophy.   There was mild diverticulosis noted at the cecum, in the ascending colon, and transverse colon.   The examination was otherwise normal.  Retroflexed views revealed no abnormalities. The time to cecum = 6.2 Withdrawal time = 8.3   The scope was withdrawn and the procedure completed. COMPLICATIONS: There were no immediate complications.  ENDOSCOPIC IMPRESSION: 1.   There was severe diverticulosis noted in the descending colon 2.   There was mild diverticulosis noted at the cecum, in the ascending colon, and transverse colon 3.   The examination was  otherwise normal  RECOMMENDATIONS: Given your age, you will not need another colonoscopy for colon cancer screening or polyp surveillance.  These types of tests usually stop around the age 26.  eSigned:  Inda Castle, MD 11/26/2014 11:03 AM Revised: 11/26/2014 11:03 AM  cc:   PATIENT NAME:  [name], [name] MR#: [id number]

## 2014-11-26 NOTE — Patient Instructions (Signed)
YOU HAD AN ENDOSCOPIC PROCEDURE TODAY AT THE [facility]:   Refer to the procedure report that was given to you for any specific questions about what was found during the examination.  If the procedure report does not answer your questions, please call your gastroenterologist to clarify.  If you requested that your care partner not be given the details of your procedure findings, then the procedure report has been included in a sealed envelope for you to review at your convenience later.  YOU SHOULD EXPECT: Some feelings of bloating in the abdomen. Passage of more gas than usual.  Walking can help get rid of the air that was put into your GI tract during the procedure and reduce the bloating. If you had a lower endoscopy (such as a colonoscopy or flexible sigmoidoscopy) you may notice spotting of blood in your stool or on the toilet paper. If you underwent a bowel prep for your procedure, you may not have a normal bowel movement for a few days.  Please Note:  You might notice some irritation and congestion in your nose or some drainage.  This is from the oxygen used during your procedure.  There is no need for concern and it should clear up in a day or so.  SYMPTOMS TO REPORT IMMEDIATELY:   Following lower endoscopy (colonoscopy or flexible sigmoidoscopy):  Excessive amounts of blood in the stool  Significant tenderness or worsening of abdominal pains  Swelling of the abdomen that is new, acute  Fever of 100F or higher  For urgent or emergent issues, a gastroenterologist can be reached at any hour by calling [phone].   DIET: Your first meal following the procedure should be a small meal and then it is ok to progress to your normal diet. Heavy or fried foods are harder to digest and may make you feel nauseous or bloated.  Likewise, meals heavy in dairy and vegetables can increase bloating.  Drink plenty of fluids but you should avoid alcoholic beverages for 24  hours.  ACTIVITY:  You should plan to take it easy for the rest of today and you should NOT DRIVE or use heavy machinery until tomorrow (because of the sedation medicines used during the test).    FOLLOW UP: Our staff will call the number listed on your records the next business day following your procedure to check on you and address any questions or concerns that you may have regarding the information given to you following your procedure. If we do not reach you, we will leave a message.  However, if you are feeling well and you are not experiencing any problems, there is no need to return our call.  We will assume that you have returned to your regular daily activities without incident.  If any biopsies were taken you will be contacted by phone or by letter within the next 1-3 weeks.  Please call us at [phone] if you have not heard about the biopsies in 3 weeks.    SIGNATURES/CONFIDENTIALITY: You and/or your care partner have signed paperwork which will be entered into your electronic medical record.  These signatures attest to the fact that that the information above on your After Visit Summary has been reviewed and is understood.  Full responsibility of the confidentiality of this discharge information lies with you and/or your care-partner.  Recommendations Discharge instructions given to patient and/or care partner. Diverticulosis and high fiber diet handouts provided. 

## 2014-11-27 ENCOUNTER — Telehealth: Payer: Self-pay

## 2014-11-27 NOTE — Telephone Encounter (Signed)
  Follow up Call-  Call back number 11/26/2014  Post procedure Call Back phone  # [phone] or [phone]  Permission to leave phone message Yes     Patient questions:  Do you have a fever, pain , or abdominal swelling? No. Pain Score  0 *  Have you tolerated food without any problems? Yes.    Have you been able to return to your normal activities? Yes.    Do you have any questions about your discharge instructions: Diet   No. Medications  No. Follow up visit  No.  Do you have questions or concerns about your Care? No.  Actions: * If pain score is 4 or above: No action needed, pain <4.

## 2014-12-02 ENCOUNTER — Other Ambulatory Visit: Payer: Self-pay | Admitting: Medical Oncology

## 2014-12-02 DIAGNOSIS — C9000 Multiple myeloma not having achieved remission: Secondary | ICD-10-CM

## 2014-12-03 ENCOUNTER — Ambulatory Visit (HOSPITAL_COMMUNITY)
Admission: RE | Admit: 2014-12-03 | Discharge: 2014-12-03 | Disposition: A | Discharge status: Home / Self Care | Payer: Medicare HMO | Source: Ambulatory Visit | Attending: Hematology | Admitting: Hematology

## 2014-12-03 ENCOUNTER — Other Ambulatory Visit (HOSPITAL_BASED_OUTPATIENT_CLINIC_OR_DEPARTMENT_OTHER): Payer: Medicare HMO

## 2014-12-03 DIAGNOSIS — C9000 Multiple myeloma not having achieved remission: Secondary | ICD-10-CM | POA: Diagnosis not present

## 2014-12-03 DIAGNOSIS — D472 Monoclonal gammopathy: Secondary | ICD-10-CM | POA: Diagnosis not present

## 2014-12-03 LAB — CBC WITH DIFFERENTIAL/PLATELET
BASO%: 1 % (ref 0.0–2.0)
Basophils Absolute: 0 10*3/uL (ref 0.0–0.1)
EOS%: 4.8 % (ref 0.0–7.0)
Eosinophils Absolute: 0.2 10*3/uL (ref 0.0–0.5)
HCT: 35.2 % — ABNORMAL LOW (ref 38.4–49.9)
HGB: 12.2 g/dL — ABNORMAL LOW (ref 13.0–17.1)
LYMPH%: 18.4 % (ref 14.0–49.0)
MCH: 33.6 pg — AB (ref 27.2–33.4)
MCHC: 34.7 g/dL (ref 32.0–36.0)
MCV: 96.9 fL (ref 79.3–98.0)
MONO#: 0.7 10*3/uL (ref 0.1–0.9)
MONO%: 14.5 % — ABNORMAL HIGH (ref 0.0–14.0)
NEUT#: 2.9 10*3/uL (ref 1.5–6.5)
NEUT%: 61.3 % (ref 39.0–75.0)
PLATELETS: 213 10*3/uL (ref 140–400)
RBC: 3.63 10*6/uL — AB (ref 4.20–5.82)
RDW: 12.2 % (ref 11.0–14.6)
WBC: 4.7 10*3/uL (ref 4.0–10.3)
lymph#: 0.9 10*3/uL (ref 0.9–3.3)

## 2014-12-03 LAB — COMPREHENSIVE METABOLIC PANEL (CC13)
ALT: 42 U/L (ref 0–55)
AST: 34 U/L (ref 5–34)
Albumin: 3.4 g/dL — ABNORMAL LOW (ref 3.5–5.0)
Alkaline Phosphatase: 63 U/L (ref 40–150)
Anion Gap: 8 mEq/L (ref 3–11)
BUN: 9.6 mg/dL (ref 7.0–26.0)
CALCIUM: 9.3 mg/dL (ref 8.4–10.4)
CHLORIDE: 103 meq/L (ref 98–109)
CO2: 27 mEq/L (ref 22–29)
Creatinine: 0.7 mg/dL (ref 0.7–1.3)
EGFR: >90 mL/min/{1.73_m2} (ref >90)
Glucose: 90 mg/dl (ref 70–140)
Potassium: 3.8 mEq/L (ref 3.5–5.1)
Sodium: 138 mEq/L (ref 136–145)
TOTAL PROTEIN: 7.8 g/dL (ref 6.4–8.3)
Total Bilirubin: 0.73 mg/dL (ref 0.20–1.20)

## 2014-12-05 LAB — IMMUNOFIXATION ELECTROPHORESIS
IGA: 98 mg/dL (ref 68–379)
IGM, SERUM: 3500 mg/dL — AB (ref 41–251)
IgG (Immunoglobin G), Serum: 349 mg/dL — ABNORMAL LOW (ref 650–1600)
Total Protein, Serum Electrophoresis: 7.6 g/dL (ref 6.0–8.3)

## 2014-12-05 LAB — BETA 2 MICROGLOBULIN, SERUM: BETA 2 MICROGLOBULIN: 1.86 mg/L (ref <=2.51)

## 2014-12-10 ENCOUNTER — Ambulatory Visit: Payer: Medicare HMO

## 2014-12-11 ENCOUNTER — Encounter: Payer: Self-pay | Admitting: Internal Medicine

## 2014-12-11 ENCOUNTER — Telehealth: Payer: Self-pay | Admitting: Internal Medicine

## 2014-12-11 ENCOUNTER — Ambulatory Visit (HOSPITAL_BASED_OUTPATIENT_CLINIC_OR_DEPARTMENT_OTHER): Payer: Medicare HMO | Admitting: Internal Medicine

## 2014-12-11 VITALS — BP 131/60 | HR 65 | Temp 98.4°F | Resp 18 | Ht 71.0 in | Wt 218.5 lb

## 2014-12-11 DIAGNOSIS — D649 Anemia, unspecified: Secondary | ICD-10-CM

## 2014-12-11 DIAGNOSIS — C9000 Multiple myeloma not having achieved remission: Secondary | ICD-10-CM | POA: Diagnosis not present

## 2014-12-11 NOTE — Progress Notes (Signed)
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   INTERVAL HISTORY: [name] 74 y.o. male returns to the clinic today for follow-up visit. He is a former patient of Dr. Ralene Ok and Dr. Juliann Mule who came today to establish care with me after these physicians left the practice. The patient was diagnosed with smoldering multiple myeloma in August 2011. Bone marrow biopsy and aspirate at that time showed 15% plasma cells. The patient continued on observation with no active treatment since that time. He is feeling fine today with no specific complaints except for mild fatigue. He denied having any significant chest pain, shortness of breath, cough or hemoptysis. He has a history of alcohol abuse but he quit 2 months ago. The patient had repeat myeloma panel as well as a skeletal bone survey recently and he is here for evaluation and discussion of his lab and imaging results.  MEDICAL HISTORY: Past Medical History  Diagnosis Date  . CAD (coronary artery disease)     A.  s/p CFX in the past;   B.  cath 06/2010: LAD 30-40%, D1 50%, OM1 50%, AVCFX stent 30%, dCFX 70-80% (med Rx), mRCA 40%;      C.  Echo 06/2010: EF 60-65%, mod LVH; mild LAE     . Heart block   . 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 Hypertension   . Multiple myeloma   . Sleep apnea   . Degenerative disc disease   . Venous insufficiency   . Cubital tunnel syndrome   . Atrial flutter     A. s/p prior ablation;  B.  s/p CTI ablation 10/24/10 (Dr. Caryl Comes)  . Hx of adenomatous colonic polyps   . Hyperlipidemia     ALLERGIES:  has No Known Allergies.  MEDICATIONS:  Current Outpatient Prescriptions  Medication Sig Dispense Refill  . amoxicillin (AMOXIL) 500 MG  capsule 1 HOUR  BEFORE  DENTAL  PROCEDURE 4 capsule 2  . aspirin 81 MG tablet Take 81 mg by mouth daily.      Marland Kitchen atenolol (TENORMIN) 50 MG tablet TAKE ONE TABLET BY MOUTH ONCE DAILY 90 tablet 1  . atorvastatin (LIPITOR) 10 MG tablet Take 10 mg by mouth daily.    . Glucosamine HCl 1000 MG TABS Take 2 tablets by mouth daily.    Marland Kitchen losartan (COZAAR) 100 MG tablet Take 100 mg by mouth daily.    . Multiple Vitamin (MULTIVITAMIN) capsule Take 1 capsule by mouth daily.      . NON FORMULARY CPAP Machine at night    . Omega-3 Fatty Acids (FISH OIL) 1200 MG CAPS Take 1 capsule by mouth daily.      Marland Kitchen thiamine 100 MG tablet Take 100 mg by mouth daily.    . nitroGLYCERIN (NITROSTAT) 0.4 MG SL tablet Place 0.4 mg under the tongue every 5 (five) minutes as needed for chest pain.     No current facility-administered medications for this visit.    SURGICAL HISTORY:  Past Surgical History  Procedure Laterality Date  . Coronary angioplasty with stent placement  2005  . Ventral hernia repair    . Right olecranon nerve surgery    . Pacemaker placement  2011  . Colonoscopy  10-07-2009  TA polyp, tics, hems   . Polypectomy      REVIEW OF SYSTEMS:  Constitutional: positive for fatigue Eyes: negative Ears, nose, mouth, throat, and face: negative Respiratory: negative Cardiovascular: negative Gastrointestinal: negative Genitourinary:negative Integument/breast: negative Hematologic/lymphatic: negative Musculoskeletal:negative Neurological: negative Behavioral/Psych: negative Endocrine: negative Allergic/Immunologic: negative   PHYSICAL EXAMINATION: General appearance: alert, cooperative, fatigued and no distress Head: Normocephalic, without obvious abnormality, atraumatic Neck: no adenopathy, no JVD, supple, symmetrical, trachea midline and thyroid not enlarged, symmetric, no tenderness/mass/nodules Lymph nodes: Cervical, supraclavicular, and axillary nodes normal. Resp: clear to auscultation  bilaterally Back: symmetric, no curvature. ROM normal. No CVA tenderness. Cardio: regular rate and rhythm, S1, S2 normal, no murmur, click, rub or gallop GI: soft, non-tender; bowel sounds normal; no masses,  no organomegaly Extremities: extremities normal, atraumatic, no cyanosis or edema Neurologic: Alert and oriented X 3, normal strength and tone. Normal symmetric reflexes. Normal coordination and gait  ECOG PERFORMANCE STATUS: 1 - Symptomatic but completely ambulatory  Blood pressure 131/60, pulse 65, temperature 98.4 F (36.9 C), temperature source Oral, resp. rate 18, height 5' 11"  (1.803 m), weight 218 lb 8 oz (99.111 kg), SpO2 99 %.  LABORATORY DATA: Lab Results  Component Value Date   WBC 4.7 12/03/2014   HGB 12.2* 12/03/2014   HCT 35.2* 12/03/2014   MCV 96.9 12/03/2014   PLT 213 12/03/2014      Chemistry      Component Value Date/Time   NA 138 12/03/2014 0848   NA 131* 04/29/2013 0900   K 3.8 12/03/2014 0848   K 4.1 04/29/2013 0900   CL 97 04/29/2013 0900   CL 97* 12/03/2012 1015   CO2 27 12/03/2014 0848   CO2 24 04/29/2013 0900   BUN 9.6 12/03/2014 0848   BUN 5* 04/29/2013 0900   CREATININE 0.7 12/03/2014 0848   CREATININE 0.48* 04/29/2013 0900      Component Value Date/Time   CALCIUM 9.3 12/03/2014 0848   CALCIUM 9.2 04/29/2013 0900   ALKPHOS 63 12/03/2014 0848   ALKPHOS 68 04/28/2013 1851   AST 34 12/03/2014 0848   AST 74* 04/28/2013 1851   ALT 42 12/03/2014 0848   ALT 52 04/28/2013 1851   BILITOT 0.73 12/03/2014 0848   BILITOT 0.6 04/28/2013 1851       RADIOGRAPHIC STUDIES: Dg Bone Survey Met  12/03/2014   CLINICAL DATA:  Staging of multiple myeloma ; history of a fall 10 days ago with possible rib fractures on the right  EXAM: METASTATIC BONE SURVEY  COMPARISON:  Bone survey of March 19, 2010  FINDINGS: Skull: There is an approximately 8 mm diameter lucency noted in the posterior aspect of the frontal bone which is more conspicuous than in the past.   Spine: The vertebral bodies are preserved in height. There is moderate degenerative disc change in the mid and lower cervical spine with milder degenerative disc changes in the thoracic and lumbar spines. There is no lytic or blastic lesion. There are dense calcifications in the region of the carotid bulbs bilaterally.  Chest: There is subtle contour deformity of the lateral aspect of the right tenth rib unchanged since a chest x-ray of February 21, 2014. The heart and pulmonary vascularity are normal. The lungs are clear. A permanent pacemaker is in place.  Pectoral girdle: No lytic or blastic lesions are demonstrated within the upper extremities. There are mild degenerative changes of both shoulders.  Pelvis: The bony pelvis is adequately mineralized with no lytic or blastic lesion. The hips  and lower extremities reveal no lytic or blastic lesions.  IMPRESSION: 1. 8 mm diameter lytic lesion in the right frontal bone. Elsewhere there are no findings to suggest progressive disease. 2. There is moderate degenerative disc disease in the mid and lower cervical spine with milder degenerative changes elsewhere in the spine. 3. Atherosclerotic calcification in the region of the carotid bulbs bilaterally.   Electronically Signed   By: David  Martinique   On: 12/03/2014 09:58    ASSESSMENT AND PLAN: This is a very pleasant 74 years old white male with history of smoldering multiple myeloma diagnosed in August 2011 and has been observation since that time. The patient is doing fine today with no specific complaints. His myeloma panel showed no significant evidence for disease progression. He has mild anemia but normal renal function and no hypercalcemia. The patient to skeletal bone survey showed 0.8 cm lytic lesion in the right frontal bone. I discussed the lab and imaging results with the patient today. I recommended for him to continue on observation with repeat myeloma panel in 6 months. For hypertension he will continue  his follow-up visit with his primary care physician and cardiologist for adjustment of his medications. He was advised to call immediately if he has any concerning symptoms in the interval. The patient voices understanding of current disease status and treatment options and is in agreement with the current care plan.  All questions were answered. The patient knows to call the clinic with any problems, questions or concerns. We can certainly see the patient much sooner if necessary.  I spent 15 minutes counseling the patient face to face. The total time spent in the appointment was 25 minutes.  Disclaimer: This note was dictated with voice recognition software. Similar sounding words can inadvertently be transcribed and may not be corrected upon review.

## 2014-12-11 NOTE — Telephone Encounter (Signed)
Gave avs & calendar for September °

## 2014-12-16 ENCOUNTER — Encounter: Payer: Self-pay | Admitting: Cardiology

## 2014-12-24 ENCOUNTER — Encounter: Payer: Self-pay | Admitting: Internal Medicine

## 2015-02-16 ENCOUNTER — Ambulatory Visit (INDEPENDENT_AMBULATORY_CARE_PROVIDER_SITE_OTHER): Payer: Medicare HMO | Admitting: *Deleted

## 2015-02-16 DIAGNOSIS — I495 Sick sinus syndrome: Secondary | ICD-10-CM

## 2015-02-17 NOTE — Progress Notes (Signed)
Remote pacemaker transmission.   

## 2015-02-21 LAB — CUP PACEART REMOTE DEVICE CHECK
Battery Remaining Percentage: 67 %
Brady Statistic RA Percent Paced: 78 %
Brady Statistic RV Percent Paced: 99 %
Lead Channel Impedance Value: 450 Ohm
Lead Channel Pacing Threshold Amplitude: 0.5 V
Lead Channel Pacing Threshold Amplitude: 0.875 V
Lead Channel Pacing Threshold Pulse Width: 0.3 ms
Lead Channel Pacing Threshold Pulse Width: 0.4 ms
Lead Channel Sensing Intrinsic Amplitude: 2.8 mV
Lead Channel Setting Pacing Amplitude: 1.125
Lead Channel Setting Pacing Amplitude: 1.5 V
Lead Channel Setting Pacing Pulse Width: 0.4 ms
Lead Channel Setting Sensing Sensitivity: 2 mV
MDC IDC MSMT BATTERY REMAINING LONGEVITY: 91 mo
MDC IDC MSMT BATTERY VOLTAGE: 2.93 V
MDC IDC MSMT LEADCHNL RV IMPEDANCE VALUE: 480 Ohm
MDC IDC MSMT LEADCHNL RV SENSING INTR AMPL: 12 mV
MDC IDC SESS DTM: 20160606113844
MDC IDC STAT BRADY AP VP PERCENT: 78 %
MDC IDC STAT BRADY AP VS PERCENT: 1 %
MDC IDC STAT BRADY AS VP PERCENT: 22 %
MDC IDC STAT BRADY AS VS PERCENT: 1 %
Pulse Gen Serial Number: 7163077

## 2015-02-25 ENCOUNTER — Other Ambulatory Visit: Payer: Self-pay | Admitting: Cardiovascular Disease

## 2015-03-04 ENCOUNTER — Encounter: Payer: Self-pay | Admitting: Cardiology

## 2015-03-17 ENCOUNTER — Encounter: Payer: Self-pay | Admitting: Internal Medicine

## 2015-03-19 NOTE — Progress Notes (Signed)
Patient ID: [name], male   DOB: 07-25-1941, 74 y.o.   MRN: [id number]   [name] is a 74 y.o. . male Seen in followup for atrial flutter status post ablation. He also has a history of complete heart block status post pacemakerin 2011 . He is without significant symptoms of chest pain shortness of breath or edema.  He has coronary artery disease - status post circumflex stent in the past.Cardiac catheterization in October 2011 demonstrated left anterior  descending 30% to 40% stenosis with other nonobstructive disease with medical therapy recommended.  Has Merlin machine to check pacer at home Hyponatremic on diuretic so stopped  Urologic issues resolved Saw Dr Risa Grill last month Needs new nitro   Has a plasma cell dyscrasia. Minor change in LFTls AST 46 No need to stop statin Wife  diagnosed with colon CA and had surgery with Dr Zella Richer   ROS: Denies fever, malais, weight loss, blurry vision, decreased visual acuity, cough, sputum, SOB, hemoptysis, pleuritic pain, palpitaitons, heartburn, abdominal pain, melena, lower extremity edema, claudication, or rash.  All other systems reviewed and negative  General: Affect appropriate Healthy:  appears stated age 50: normal Neck supple with no adenopathy JVP normal no bruits no thyromegaly Lungs clear with no wheezing and good diaphragmatic motion Heart:  S1/S2 no murmur, no rub, gallop or click pacer under left clavicle  PMI normal Abdomen: benighn, BS positve, no tenderness, no AAA  Ventral hernia no bruit.  No HSM or HJR Distal pulses intact with no bruits No edema Neuro non-focal Skin warm and dry No muscular weakness   Current Outpatient Prescriptions  Medication Sig Dispense Refill  . amoxicillin (AMOXIL) 500 MG capsule 1 HOUR  BEFORE  DENTAL  PROCEDURE 4 capsule 2  . aspirin 81 MG tablet Take 81 mg by mouth daily.      Marland Kitchen atenolol (TENORMIN) 50 MG tablet TAKE ONE TABLET BY MOUTH ONCE DAILY 90 tablet 0  .  atorvastatin (LIPITOR) 10 MG tablet Take 10 mg by mouth daily.    . Glucosamine HCl 1000 MG TABS Take 2 tablets by mouth daily.    Marland Kitchen losartan (COZAAR) 100 MG tablet Take 100 mg by mouth daily.    . Multiple Vitamin (MULTIVITAMIN) capsule Take 1 capsule by mouth daily.      . nitroGLYCERIN (NITROSTAT) 0.4 MG SL tablet Place 0.4 mg under the tongue every 5 (five) minutes as needed for chest pain.    . NON FORMULARY CPAP Machine at night    . Omega-3 Fatty Acids (FISH OIL) 1200 MG CAPS Take 1 capsule by mouth daily.      Marland Kitchen thiamine 100 MG tablet Take 100 mg by mouth daily.     No current facility-administered medications for this visit.    Allergies  Review of patient's allergies indicates no known allergies.  Electrocardiogram: 02/21/14   SR v pacing  03/20/15  AV pacing rate 72   Assessment and Plan Flutter:  Post ablation maint NSR Pacer:  Normal function continue Merlin transmission CAD:  Stable with no angina and good activity level.  Continue medical Rx Chol:   Cholesterol is at goal.  Continue current dose of statin and diet Rx.  No myalgias or side effects.  F/U  LFT's in 6 months. Lab Results  Component Value Date   LDLCALC 102* 10/17/2007  Labs with primary  HTN:  Well controlled.  Continue current medications and low sodium Dash type diet.

## 2015-03-20 ENCOUNTER — Encounter: Payer: Self-pay | Admitting: Cardiovascular Disease

## 2015-03-20 ENCOUNTER — Ambulatory Visit (INDEPENDENT_AMBULATORY_CARE_PROVIDER_SITE_OTHER): Payer: Medicare HMO | Admitting: Cardiovascular Disease

## 2015-03-20 VITALS — BP 100/58 | HR 72 | Ht 71.0 in | Wt 220.4 lb

## 2015-03-20 DIAGNOSIS — Z950 Presence of cardiac pacemaker: Secondary | ICD-10-CM | POA: Diagnosis not present

## 2015-03-20 NOTE — Patient Instructions (Signed)
Medication Instructions:  NO CHANGES  Labwork: NONE  Testing/Procedures: NONE  Follow-Up: Your physician wants you to follow-up in: YEAR WITH  DR NISHAN You will receive a reminder letter in the mail two months in advance. If you don't receive a letter, please call our office to schedule the follow-up appointment.   Any Other Special Instructions Will Be Listed Below (If Applicable).   

## 2015-05-19 ENCOUNTER — Telehealth: Payer: Self-pay | Admitting: Cardiology

## 2015-05-19 ENCOUNTER — Ambulatory Visit (INDEPENDENT_AMBULATORY_CARE_PROVIDER_SITE_OTHER): Payer: Medicare HMO | Admitting: *Deleted

## 2015-05-19 DIAGNOSIS — I495 Sick sinus syndrome: Secondary | ICD-10-CM

## 2015-05-19 NOTE — Telephone Encounter (Signed)
LMOVM informing pt that his remote transmission received.

## 2015-05-21 NOTE — Progress Notes (Signed)
Remote pacemaker transmission.   

## 2015-05-25 LAB — CUP PACEART REMOTE DEVICE CHECK
Battery Remaining Longevity: 98 mo
Brady Statistic AS VP Percent: 19 %
Brady Statistic AS VS Percent: 1 %
Brady Statistic RA Percent Paced: 81 %
Brady Statistic RV Percent Paced: 99 %
Date Time Interrogation Session: 20160906122042
Lead Channel Pacing Threshold Amplitude: 0.875 V
Lead Channel Pacing Threshold Pulse Width: 0.3 ms
Lead Channel Pacing Threshold Pulse Width: 0.4 ms
Lead Channel Sensing Intrinsic Amplitude: 12 mV
Lead Channel Setting Pacing Pulse Width: 0.4 ms
Lead Channel Setting Sensing Sensitivity: 2 mV
MDC IDC MSMT BATTERY REMAINING PERCENTAGE: 73 %
MDC IDC MSMT BATTERY VOLTAGE: 2.92 V
MDC IDC MSMT LEADCHNL RA IMPEDANCE VALUE: 440 Ohm
MDC IDC MSMT LEADCHNL RA PACING THRESHOLD AMPLITUDE: 0.5 V
MDC IDC MSMT LEADCHNL RA SENSING INTR AMPL: 1.9 mV
MDC IDC MSMT LEADCHNL RV IMPEDANCE VALUE: 450 Ohm
MDC IDC PG SERIAL: 7163077
MDC IDC SET LEADCHNL RA PACING AMPLITUDE: 1.5 V
MDC IDC SET LEADCHNL RV PACING AMPLITUDE: 1.125
MDC IDC STAT BRADY AP VP PERCENT: 81 %
MDC IDC STAT BRADY AP VS PERCENT: 1 %
Pulse Gen Model: 2110

## 2015-05-26 ENCOUNTER — Other Ambulatory Visit: Payer: Self-pay | Admitting: Cardiovascular Disease

## 2015-06-02 ENCOUNTER — Other Ambulatory Visit (HOSPITAL_BASED_OUTPATIENT_CLINIC_OR_DEPARTMENT_OTHER): Payer: Medicare HMO

## 2015-06-02 DIAGNOSIS — C9000 Multiple myeloma not having achieved remission: Secondary | ICD-10-CM | POA: Diagnosis not present

## 2015-06-02 LAB — LACTATE DEHYDROGENASE (CC13): LDH: 149 U/L (ref 125–245)

## 2015-06-02 LAB — CBC WITH DIFFERENTIAL/PLATELET
BASO%: 0.7 % (ref 0.0–2.0)
BASOS ABS: 0 10*3/uL (ref 0.0–0.1)
EOS%: 3.3 % (ref 0.0–7.0)
Eosinophils Absolute: 0.2 10*3/uL (ref 0.0–0.5)
HCT: 35.1 % — ABNORMAL LOW (ref 38.4–49.9)
HGB: 11.8 g/dL — ABNORMAL LOW (ref 13.0–17.1)
LYMPH%: 25.1 % (ref 14.0–49.0)
MCH: 31.1 pg (ref 27.2–33.4)
MCHC: 33.6 g/dL (ref 32.0–36.0)
MCV: 92.6 fL (ref 79.3–98.0)
MONO#: 0.6 10*3/uL (ref 0.1–0.9)
MONO%: 9.4 % (ref 0.0–14.0)
NEUT#: 3.7 10*3/uL (ref 1.5–6.5)
NEUT%: 61.5 % (ref 39.0–75.0)
PLATELETS: 214 10*3/uL (ref 140–400)
RBC: 3.79 10*6/uL — AB (ref 4.20–5.82)
RDW: 12.9 % (ref 11.0–14.6)
WBC: 6.1 10*3/uL (ref 4.0–10.3)
lymph#: 1.5 10*3/uL (ref 0.9–3.3)

## 2015-06-02 LAB — COMPREHENSIVE METABOLIC PANEL (CC13)
ALT: 18 U/L (ref 0–55)
ANION GAP: 7 meq/L (ref 3–11)
AST: 18 U/L (ref 5–34)
Albumin: 3.4 g/dL — ABNORMAL LOW (ref 3.5–5.0)
Alkaline Phosphatase: 58 U/L (ref 40–150)
BUN: 6.6 mg/dL — ABNORMAL LOW (ref 7.0–26.0)
CHLORIDE: 103 meq/L (ref 98–109)
CO2: 29 meq/L (ref 22–29)
Calcium: 9.3 mg/dL (ref 8.4–10.4)
Creatinine: 0.8 mg/dL (ref 0.7–1.3)
EGFR: 90 mL/min/{1.73_m2} — AB (ref >90)
Glucose: 96 mg/dl (ref 70–140)
POTASSIUM: 4 meq/L (ref 3.5–5.1)
Sodium: 139 mEq/L (ref 136–145)
Total Bilirubin: 0.55 mg/dL (ref 0.20–1.20)
Total Protein: 7.8 g/dL (ref 6.4–8.3)

## 2015-06-08 LAB — BETA 2 MICROGLOBULIN, SERUM: Beta-2 Microglobulin: 1.68 mg/L (ref <=2.51)

## 2015-06-08 LAB — KAPPA/LAMBDA LIGHT CHAINS
Kappa free light chain: 1.3 mg/dL (ref 0.33–1.94)
Kappa:Lambda Ratio: 0.17 — ABNORMAL LOW (ref 0.26–1.65)
Lambda Free Lght Chn: 7.74 mg/dL — ABNORMAL HIGH (ref 0.57–2.63)

## 2015-06-08 LAB — IGG, IGA, IGM
IgA: 101 mg/dL (ref 68–379)
IgG (Immunoglobin G), Serum: 348 mg/dL — ABNORMAL LOW (ref 650–1600)
IgM, Serum: 3210 mg/dL — ABNORMAL HIGH (ref 41–251)

## 2015-06-09 ENCOUNTER — Ambulatory Visit (HOSPITAL_BASED_OUTPATIENT_CLINIC_OR_DEPARTMENT_OTHER): Payer: Medicare HMO | Admitting: Internal Medicine

## 2015-06-09 ENCOUNTER — Telehealth: Payer: Self-pay | Admitting: Internal Medicine

## 2015-06-09 ENCOUNTER — Ambulatory Visit (HOSPITAL_BASED_OUTPATIENT_CLINIC_OR_DEPARTMENT_OTHER): Payer: Medicare HMO

## 2015-06-09 ENCOUNTER — Encounter: Payer: Self-pay | Admitting: Internal Medicine

## 2015-06-09 VITALS — BP 134/78 | HR 93 | Temp 97.8°F | Resp 17 | Ht 71.0 in | Wt 221.1 lb

## 2015-06-09 DIAGNOSIS — C9000 Multiple myeloma not having achieved remission: Secondary | ICD-10-CM

## 2015-06-09 DIAGNOSIS — Z23 Encounter for immunization: Secondary | ICD-10-CM | POA: Diagnosis not present

## 2015-06-09 MED ORDER — INFLUENZA VAC SPLIT QUAD 0.5 ML IM SUSY
0.5000 mL | PREFILLED_SYRINGE | Freq: Once | INTRAMUSCULAR | Status: AC
  Administered 2015-06-09: 0.5 mL via INTRAMUSCULAR
  Filled 2015-06-09: qty 0.5

## 2015-06-09 NOTE — Progress Notes (Signed)
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   INTERVAL HISTORY: [name] 74 y.o. male returns to the clinic today for follow-up visit. The patient was diagnosed with smoldering multiple myeloma in August 2011. Bone marrow biopsy and aspirate at that time showed 15% plasma cells. The patient continued on observation with no active treatment since that time. He is feeling fine today with no specific complaints except for mild fatigue and occasional dizzy spells when he leans forward. He denied having any significant chest pain, shortness of breath, cough or hemoptysis. He has a history of alcohol abuse but he quit in January 2016. The patient had repeat myeloma panel recently and he is here for evaluation and discussion of his lab results.  MEDICAL HISTORY: Past Medical History  Diagnosis Date  . CAD (coronary artery disease)     A.  s/p CFX in the past;   B.  cath 06/2010: LAD 30-40%, D1 50%, OM1 50%, AVCFX stent 30%, dCFX 70-80% (med Rx), mRCA 40%;      C.  Echo 06/2010: EF 60-65%, mod LVH; mild LAE     . Heart block   . 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 Hypertension   . Multiple myeloma   . Sleep apnea   . Degenerative disc disease   . Venous insufficiency   . Cubital tunnel syndrome   . Atrial flutter     A. s/p prior ablation;  B.  s/p CTI ablation 10/24/10 (Dr. Caryl Comes)  . Hx of adenomatous colonic polyps   . Hyperlipidemia     ALLERGIES:  has No Known Allergies.  MEDICATIONS:  Current Outpatient Prescriptions  Medication Sig Dispense Refill  . aspirin 81 MG tablet Take 81 mg by mouth daily.      Marland Kitchen atenolol (TENORMIN) 50 MG tablet TAKE ONE TABLET BY MOUTH ONCE DAILY 90 tablet 2  . atorvastatin  (LIPITOR) 10 MG tablet Take 10 mg by mouth daily.    . Glucosamine HCl 1000 MG TABS Take 2 tablets by mouth daily.    Marland Kitchen ibuprofen (ADVIL,MOTRIN) 800 MG tablet Take 800 mg by mouth as needed. For pain and inflammation    . losartan (COZAAR) 100 MG tablet Take 100 mg by mouth daily.    . Multiple Vitamin (MULTIVITAMIN) capsule Take 1 capsule by mouth daily.      . nitroGLYCERIN (NITROSTAT) 0.4 MG SL tablet Place 0.4 mg under the tongue every 5 (five) minutes as needed for chest pain.    . NON FORMULARY CPAP Machine at night    . Omega-3 Fatty Acids (FISH OIL) 1200 MG CAPS Take 1 capsule by mouth daily.      Marland Kitchen thiamine 100 MG tablet Take 100 mg by mouth daily.     No current facility-administered medications for this visit.    SURGICAL HISTORY:  Past Surgical History  Procedure Laterality Date  . Coronary angioplasty with stent placement  2005  . Ventral hernia repair    . Right olecranon nerve surgery    . Pacemaker placement  2011  . Colonoscopy  10-07-2009    TA polyp, tics, hems   . Polypectomy      REVIEW OF SYSTEMS:  Constitutional: positive for fatigue Eyes: negative  Ears, nose, mouth, throat, and face: negative Respiratory: negative Cardiovascular: negative Gastrointestinal: negative Genitourinary:negative Integument/breast: negative Hematologic/lymphatic: negative Musculoskeletal:negative Neurological: negative Behavioral/Psych: negative Endocrine: negative Allergic/Immunologic: negative   PHYSICAL EXAMINATION: General appearance: alert, cooperative, fatigued and no distress Head: Normocephalic, without obvious abnormality, atraumatic Neck: no adenopathy, no JVD, supple, symmetrical, trachea midline and thyroid not enlarged, symmetric, no tenderness/mass/nodules Lymph nodes: Cervical, supraclavicular, and axillary nodes normal. Resp: clear to auscultation bilaterally Back: symmetric, no curvature. ROM normal. No CVA tenderness. Cardio: regular rate and rhythm, S1,  S2 normal, no murmur, click, rub or gallop GI: soft, non-tender; bowel sounds normal; no masses,  no organomegaly Extremities: extremities normal, atraumatic, no cyanosis or edema Neurologic: Alert and oriented X 3, normal strength and tone. Normal symmetric reflexes. Normal coordination and gait  ECOG PERFORMANCE STATUS: 1 - Symptomatic but completely ambulatory  There were no vitals taken for this visit.  LABORATORY DATA: Lab Results  Component Value Date   WBC 6.1 06/02/2015   HGB 11.8* 06/02/2015   HCT 35.1* 06/02/2015   MCV 92.6 06/02/2015   PLT 214 06/02/2015      Chemistry      Component Value Date/Time   NA 139 06/02/2015 0846   NA 131* 04/29/2013 0900   K 4.0 06/02/2015 0846   K 4.1 04/29/2013 0900   CL 97 04/29/2013 0900   CL 97* 12/03/2012 1015   CO2 29 06/02/2015 0846   CO2 24 04/29/2013 0900   BUN 6.6* 06/02/2015 0846   BUN 5* 04/29/2013 0900   CREATININE 0.8 06/02/2015 0846   CREATININE 0.48* 04/29/2013 0900      Component Value Date/Time   CALCIUM 9.3 06/02/2015 0846   CALCIUM 9.2 04/29/2013 0900   ALKPHOS 58 06/02/2015 0846   ALKPHOS 68 04/28/2013 1851   AST 18 06/02/2015 0846   AST 74* 04/28/2013 1851   ALT 18 06/02/2015 0846   ALT 52 04/28/2013 1851   BILITOT 0.55 06/02/2015 0846   BILITOT 0.6 04/28/2013 1851       RADIOGRAPHIC STUDIES: No results found.  ASSESSMENT AND PLAN: This is a very pleasant 74 years old white male with history of smoldering multiple myeloma diagnosed in August 2011 and has been observation since that time. The patient is doing fine today with no specific complaints. His myeloma panel showed no significant evidence for disease progression.  I discussed the lab results with the patient today. I recommended for him to continue on observation with repeat myeloma panel in 6 months. The patient will receive flu vaccine today. He was advised to call immediately if he has any concerning symptoms in the interval. The patient  voices understanding of current disease status and treatment options and is in agreement with the current care plan.  All questions were answered. The patient knows to call the clinic with any problems, questions or concerns. We can certainly see the patient much sooner if necessary.  Disclaimer: This note was dictated with voice recognition software. Similar sounding words can inadvertently be transcribed and may not be corrected upon review.

## 2015-06-09 NOTE — Telephone Encounter (Signed)
per pof to sch pt appt-gave pt copy of avs °

## 2015-06-17 ENCOUNTER — Encounter: Payer: Self-pay | Admitting: Cardiology

## 2015-06-23 ENCOUNTER — Encounter: Payer: Self-pay | Admitting: Internal Medicine

## 2015-08-25 DIAGNOSIS — L570 Actinic keratosis: Secondary | ICD-10-CM | POA: Diagnosis not present

## 2015-08-25 DIAGNOSIS — H25813 Combined forms of age-related cataract, bilateral: Secondary | ICD-10-CM | POA: Diagnosis not present

## 2015-08-25 DIAGNOSIS — H02831 Dermatochalasis of right upper eyelid: Secondary | ICD-10-CM | POA: Diagnosis not present

## 2015-08-25 DIAGNOSIS — H02834 Dermatochalasis of left upper eyelid: Secondary | ICD-10-CM | POA: Diagnosis not present

## 2015-08-28 ENCOUNTER — Ambulatory Visit: Payer: Medicare HMO | Admitting: Pulmonary Disease

## 2015-08-28 DIAGNOSIS — R262 Difficulty in walking, not elsewhere classified: Secondary | ICD-10-CM | POA: Diagnosis not present

## 2015-08-28 DIAGNOSIS — M170 Bilateral primary osteoarthritis of knee: Secondary | ICD-10-CM | POA: Diagnosis not present

## 2015-08-28 DIAGNOSIS — N400 Enlarged prostate without lower urinary tract symptoms: Secondary | ICD-10-CM | POA: Diagnosis not present

## 2015-09-11 ENCOUNTER — Encounter: Payer: Self-pay | Admitting: Internal Medicine

## 2015-09-11 ENCOUNTER — Ambulatory Visit (INDEPENDENT_AMBULATORY_CARE_PROVIDER_SITE_OTHER): Payer: Medicare HMO | Admitting: Internal Medicine

## 2015-09-11 VITALS — BP 132/84 | HR 60 | Ht 71.0 in | Wt 219.2 lb

## 2015-09-11 DIAGNOSIS — Z950 Presence of cardiac pacemaker: Secondary | ICD-10-CM | POA: Diagnosis not present

## 2015-09-11 DIAGNOSIS — E785 Hyperlipidemia, unspecified: Secondary | ICD-10-CM | POA: Diagnosis not present

## 2015-09-11 DIAGNOSIS — R7301 Impaired fasting glucose: Secondary | ICD-10-CM | POA: Diagnosis not present

## 2015-09-11 DIAGNOSIS — I442 Atrioventricular block, complete: Secondary | ICD-10-CM | POA: Diagnosis not present

## 2015-09-11 DIAGNOSIS — R0789 Other chest pain: Secondary | ICD-10-CM | POA: Diagnosis not present

## 2015-09-11 DIAGNOSIS — Z125 Encounter for screening for malignant neoplasm of prostate: Secondary | ICD-10-CM | POA: Diagnosis not present

## 2015-09-11 DIAGNOSIS — Z45018 Encounter for adjustment and management of other part of cardiac pacemaker: Secondary | ICD-10-CM | POA: Diagnosis not present

## 2015-09-11 DIAGNOSIS — I10 Essential (primary) hypertension: Secondary | ICD-10-CM | POA: Diagnosis not present

## 2015-09-11 DIAGNOSIS — I2510 Atherosclerotic heart disease of native coronary artery without angina pectoris: Secondary | ICD-10-CM | POA: Diagnosis not present

## 2015-09-11 DIAGNOSIS — Z0000 Encounter for general adult medical examination without abnormal findings: Secondary | ICD-10-CM | POA: Diagnosis not present

## 2015-09-11 DIAGNOSIS — E784 Other hyperlipidemia: Secondary | ICD-10-CM | POA: Diagnosis not present

## 2015-09-11 LAB — CUP PACEART INCLINIC DEVICE CHECK
Battery Remaining Longevity: 75.6
Battery Voltage: 2.92 V
Brady Statistic RA Percent Paced: 83 %
Brady Statistic RV Percent Paced: 99.98 %
Implantable Lead Implant Date: 20111021
Implantable Lead Implant Date: 20111021
Implantable Lead Location: 753859
Implantable Lead Location: 753860
Lead Channel Impedance Value: 437.5 Ohm
Lead Channel Pacing Threshold Amplitude: 0.5 V
Lead Channel Pacing Threshold Amplitude: 0.875 V
Lead Channel Setting Pacing Amplitude: 2 V
Lead Channel Setting Pacing Pulse Width: 0.5 ms
MDC IDC MSMT LEADCHNL RA PACING THRESHOLD PULSEWIDTH: 0.5 ms
MDC IDC MSMT LEADCHNL RV IMPEDANCE VALUE: 450 Ohm
MDC IDC MSMT LEADCHNL RV PACING THRESHOLD PULSEWIDTH: 0.4 ms
MDC IDC SESS DTM: 20161230093305
MDC IDC SET LEADCHNL RA PACING AMPLITUDE: 1.5 V
MDC IDC SET LEADCHNL RV SENSING SENSITIVITY: 2 mV
Pulse Gen Model: 2110
Pulse Gen Serial Number: 7163077

## 2015-09-11 NOTE — Patient Instructions (Addendum)
Medication Instructions:  Your physician recommends that you continue on your current medications as directed. Please refer to the Current Medication list given to you today.  Labwork: None ordered  Testing/Procedures: Your physician has requested that you have a lexiscan myoview. For further information please visit [email or web address]. Please follow instruction sheet, as given.  Follow-Up: Remote monitoring is used to monitor your Pacemaker of ICD from home. This monitoring reduces the number of office visits required to check your device to one time per year. It allows [place] to keep an eye on the functioning of your device to ensure it is working properly. You are scheduled for a device check from home on 12/14/2015. You may send your transmission at any time that day. If you have a wireless device, the transmission will be sent automatically. After your physician reviews your transmission, you will receive a postcard with your next transmission date.  Your physician wants you to follow-up in: 1 year with Dr. Caryl Comes.  You will receive a reminder letter in the mail two months in advance. If you don't receive a letter, please call our office to schedule the follow-up appointment.  Any Other Special Instructions Will Be Listed Below (If Applicable).  If you need a refill on your cardiac medications before your next appointment, please call your pharmacy.  Thank you for choosing [facility]!!

## 2015-09-11 NOTE — Progress Notes (Signed)
Patient Care Team: [name], MD as PCP - General (Internal Medicine) Jeanie Cooks, MD (Hematology and Oncology)   HPI  [name] is a 74 y.o. male Seen in followup for a history of complete heart block status post pacemaker 2011  He has a history of prior catheter ablation for atrial flutter. hx coronary artery disease with prior stenting.  Cardiac catheterization in October 2011 demonstrated left anterior descending 30% to 40% stenosis with other nonobstructive disease with medical therapy recommended.    No significant shortness of breath;  Has some chest pain which is brief  midsternal   He is not aware of whether it is similar to prior angina or assoc with exertion  :((  Last cath 2011  No intercurrent myoview  Limitations are mostly his knees.   His balance is also better following rehab  He continues with orthostatic intolerance but this is some better.  Past Medical History  Diagnosis Date  . CAD (coronary artery disease)     A.  s/p CFX in the past;   B.  cath 06/2010: LAD 30-40%, D1 50%, OM1 50%, AVCFX stent 30%, dCFX 70-80% (med Rx), mRCA 40%;      C.  Echo 06/2010: EF 60-65%, mod LVH; mild LAE     . Heart block   . 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 Hypertension   . Multiple myeloma   . Sleep apnea   . Degenerative disc disease   . Venous insufficiency   . Cubital tunnel syndrome   . Atrial flutter     A. s/p prior ablation;  B.  s/p CTI ablation 10/24/10 (Dr. Caryl Comes)  . Hx of adenomatous colonic polyps   . Hyperlipidemia     Past Surgical History  Procedure Laterality Date  . Coronary angioplasty with stent placement  2005  . Ventral hernia repair    . Right olecranon nerve surgery    . Pacemaker placement  2011  . Colonoscopy  10-07-2009    TA polyp, tics, hems   . Polypectomy      Current Outpatient Prescriptions  Medication Sig Dispense Refill  . aspirin 81 MG tablet Take 81 mg by mouth  daily.      Marland Kitchen atenolol (TENORMIN) 50 MG tablet TAKE ONE TABLET BY MOUTH ONCE DAILY 90 tablet 2  . atorvastatin (LIPITOR) 10 MG tablet Take 10 mg by mouth daily.    Marland Kitchen EPIPEN 2-PAK 0.3 MG/0.3ML SOAJ injection     . Glucosamine HCl 1000 MG TABS Take 2 tablets by mouth daily.    Marland Kitchen HYDROcodone-acetaminophen (NORCO/VICODIN) 5-325 MG per tablet     . ibuprofen (ADVIL,MOTRIN) 800 MG tablet Take 800 mg by mouth as needed. For pain and inflammation    . losartan (COZAAR) 100 MG tablet Take 100 mg by mouth daily.    . Multiple Vitamin (MULTIVITAMIN) capsule Take 1 capsule by mouth daily.      . nitroGLYCERIN (NITROSTAT) 0.4 MG SL tablet Place 0.4 mg under the tongue every 5 (five) minutes as needed for chest pain.    . NON FORMULARY CPAP Machine at night    . Omega-3 Fatty Acids (FISH OIL) 1200 MG CAPS Take 1 capsule by mouth daily.      Marland Kitchen thiamine 100 MG tablet Take 100 mg by mouth daily.     No current facility-administered medications for this visit.    No Known Allergies  Review of Systems negative except from HPI and PMH  Physical Exam   BP 132/84 mmHg  Pulse 60  Ht _0  (1.803 m)  Wt 219 lb 3.2 oz (99.428 kg)  BMI 30.59 kg/m2 Well developed and well nourished in no acute distress HENT normal E scleral and icterus clear Neck Supple JVP flat; carotids brisk and full Clear to ausculation Device pocket well healed; without hematoma or erythema.  There is no tethering Regular rate and rhythm, no murmurs gallops or rub Soft with active bowel sounds No clubbing cyanosis trace Edema Alert and oriented, grossly normal motor and sensory function; he is unable to stand with his feet together. He is unable to walk a straight line. Skin Warm and Dry  ECG AV pacing   Assessment and plan   Complete heart block   Pacemaker implantation-St. Jude   Orthostatic lightheadedness  Chest discomfort   The patient's heart rhythm issues are stable. Device function is normal.   Chest  discomfort while atypical occurs in the context of multiple risk factors  In prior and prior coronary disease. We will undertake a Myoview.  Orthostatic lightheadedness is better. In the event that becomes more problematic, would recommend use of an abdominal binder

## 2015-09-17 ENCOUNTER — Telehealth (HOSPITAL_COMMUNITY): Payer: Self-pay | Admitting: *Deleted

## 2015-09-17 DIAGNOSIS — Z0000 Encounter for general adult medical examination without abnormal findings: Secondary | ICD-10-CM | POA: Diagnosis not present

## 2015-09-17 DIAGNOSIS — N401 Enlarged prostate with lower urinary tract symptoms: Secondary | ICD-10-CM | POA: Diagnosis not present

## 2015-09-17 DIAGNOSIS — D472 Monoclonal gammopathy: Secondary | ICD-10-CM | POA: Diagnosis not present

## 2015-09-17 DIAGNOSIS — Z7901 Long term (current) use of anticoagulants: Secondary | ICD-10-CM | POA: Diagnosis not present

## 2015-09-17 DIAGNOSIS — G4739 Other sleep apnea: Secondary | ICD-10-CM | POA: Diagnosis not present

## 2015-09-17 DIAGNOSIS — E784 Other hyperlipidemia: Secondary | ICD-10-CM | POA: Diagnosis not present

## 2015-09-17 DIAGNOSIS — K5790 Diverticulosis of intestine, part unspecified, without perforation or abscess without bleeding: Secondary | ICD-10-CM | POA: Diagnosis not present

## 2015-09-17 DIAGNOSIS — I480 Paroxysmal atrial fibrillation: Secondary | ICD-10-CM | POA: Diagnosis not present

## 2015-09-17 DIAGNOSIS — R2689 Other abnormalities of gait and mobility: Secondary | ICD-10-CM | POA: Diagnosis not present

## 2015-09-17 DIAGNOSIS — R7301 Impaired fasting glucose: Secondary | ICD-10-CM | POA: Diagnosis not present

## 2015-09-17 NOTE — Telephone Encounter (Signed)
Attempted to call patient regarding upcoming appointment- no answer. Amilliana Hayworth J Bentleigh Stankus, RN 

## 2015-09-18 ENCOUNTER — Encounter: Payer: Self-pay | Admitting: Cardiovascular Disease

## 2015-09-18 ENCOUNTER — Encounter: Payer: Self-pay | Admitting: Internal Medicine

## 2015-09-21 ENCOUNTER — Telehealth (HOSPITAL_COMMUNITY): Payer: Self-pay | Admitting: *Deleted

## 2015-09-21 NOTE — Telephone Encounter (Signed)
Patient given detailed instructions per Myocardial Perfusion Study Information Sheet for the test on 09/22/15 at 9:45. Patient notified to arrive 15 minutes early and that it is imperative to arrive on time for appointment to keep from having the test rescheduled.  If you need to cancel or reschedule your appointment, please call the office within 24 hours of your appointment. Failure to do so may result in a cancellation of your appointment, and a $50 no show fee. Patient verbalized understanding.[name]

## 2015-09-22 ENCOUNTER — Ambulatory Visit (HOSPITAL_COMMUNITY): Payer: Medicare HMO | Attending: Cardiology

## 2015-09-22 DIAGNOSIS — R0609 Other forms of dyspnea: Secondary | ICD-10-CM | POA: Diagnosis not present

## 2015-09-22 DIAGNOSIS — I10 Essential (primary) hypertension: Secondary | ICD-10-CM | POA: Insufficient documentation

## 2015-09-22 DIAGNOSIS — R0789 Other chest pain: Secondary | ICD-10-CM | POA: Diagnosis not present

## 2015-09-22 LAB — MYOCARDIAL PERFUSION IMAGING
CHL CUP NUCLEAR SDS: 3
CHL CUP NUCLEAR SRS: 4
CHL CUP NUCLEAR SSS: 7
LV sys vol: 42 mL
LVDIAVOL: 118 mL
Peak HR: 78 {beats}/min
RATE: 0.32
Rest HR: 62 {beats}/min
TID: 1.06

## 2015-09-22 MED ORDER — REGADENOSON 0.4 MG/5ML IV SOLN
0.4000 mg | Freq: Once | INTRAVENOUS | Status: AC
  Administered 2015-09-22: 0.4 mg via INTRAVENOUS

## 2015-09-22 MED ORDER — TECHNETIUM TC 99M SESTAMIBI GENERIC - CARDIOLITE
11.0000 | Freq: Once | INTRAVENOUS | Status: AC | PRN
  Administered 2015-09-22: 11 via INTRAVENOUS

## 2015-09-22 MED ORDER — TECHNETIUM TC 99M SESTAMIBI GENERIC - CARDIOLITE
31.2000 | Freq: Once | INTRAVENOUS | Status: AC | PRN
  Administered 2015-09-22: 31.2 via INTRAVENOUS

## 2015-09-24 DIAGNOSIS — H2512 Age-related nuclear cataract, left eye: Secondary | ICD-10-CM | POA: Diagnosis not present

## 2015-09-24 DIAGNOSIS — H268 Other specified cataract: Secondary | ICD-10-CM | POA: Diagnosis not present

## 2015-10-01 ENCOUNTER — Ambulatory Visit (INDEPENDENT_AMBULATORY_CARE_PROVIDER_SITE_OTHER): Payer: Medicare HMO | Admitting: Pulmonary Disease

## 2015-10-01 ENCOUNTER — Encounter: Payer: Self-pay | Admitting: Pulmonary Disease

## 2015-10-01 VITALS — BP 144/78 | HR 95 | Ht 71.0 in | Wt 214.0 lb

## 2015-10-01 DIAGNOSIS — R69 Illness, unspecified: Secondary | ICD-10-CM | POA: Diagnosis not present

## 2015-10-01 DIAGNOSIS — G4733 Obstructive sleep apnea (adult) (pediatric): Secondary | ICD-10-CM | POA: Diagnosis not present

## 2015-10-01 DIAGNOSIS — F17200 Nicotine dependence, unspecified, uncomplicated: Secondary | ICD-10-CM

## 2015-10-01 NOTE — Patient Instructions (Signed)
CPAP supplies will be renewed x 1 year Bring in the chip & we can check report

## 2015-10-01 NOTE — Progress Notes (Signed)
   Subjective:    Patient ID: IVAR DOMANGUE, male    DOB: 11-30-1940, 75 y.o.   MRN: [id number]  HPI  Chief Complaint  Patient presents with  . Follow-up    Former [facility] pt. Pt states that he uses CPAP nightly for about 6-8 hours. Pt does take occasional naps in the afternoon. Pt reports that he does sleep well.    Clair Gulling - for FU of severe OSA Has multiple myeloma  Very cmpliant, had cataract surgery - missed cpap x 1 day Works well, rested in am, no naps, no snoring Humidity @ 5 Mask ok, pr ok No dryness Wt unchanged  Quit ETOH x 1 year  Significant tests/ events  NPSG 2006:  AHI 60/hr.  cpap 12cm optimal   Review of Systems neg for any significant sore throat, dysphagia, itching, sneezing, nasal congestion or excess/ purulent secretions, fever, chills, sweats, unintended wt loss, pleuritic or exertional cp, hempoptysis, orthopnea pnd or change in chronic leg swelling. Also denies presyncope, palpitations, heartburn, abdominal pain, nausea, vomiting, diarrhea or change in bowel or urinary habits, dysuria,hematuria, rash, arthralgias, visual complaints, headache, numbness weakness or ataxia.     Objective:   Physical Exam  Gen. Pleasant, well-nourished, in no distress ENT - no lesions, no post nasal drip Neck: No JVD, no thyromegaly, no carotid bruits Lungs: no use of accessory muscles, no dullness to percussion, clear without rales or rhonchi  Cardiovascular: Rhythm regular, heart sounds  normal, no murmurs or gallops, no peripheral edema Musculoskeletal: No deformities, no cyanosis or clubbing        Assessment & Plan:

## 2015-10-01 NOTE — Assessment & Plan Note (Signed)
Quit, counselled

## 2015-10-01 NOTE — Assessment & Plan Note (Signed)
CPAP supplies will be renewed x 1 year Bring in the chip & we can check report   Weight loss encouraged, compliance with goal of at least 4-6 hrs every night is the expectation. Advised against medications with sedative side effects Cautioned against driving when sleepy - understanding that sleepiness will vary on a day to day basis

## 2015-10-19 DIAGNOSIS — H2511 Age-related nuclear cataract, right eye: Secondary | ICD-10-CM | POA: Diagnosis not present

## 2015-10-22 DIAGNOSIS — H2511 Age-related nuclear cataract, right eye: Secondary | ICD-10-CM | POA: Diagnosis not present

## 2015-11-04 ENCOUNTER — Encounter: Payer: Self-pay | Admitting: Pulmonary Disease

## 2015-11-18 DIAGNOSIS — M170 Bilateral primary osteoarthritis of knee: Secondary | ICD-10-CM | POA: Diagnosis not present

## 2015-11-18 DIAGNOSIS — M1711 Unilateral primary osteoarthritis, right knee: Secondary | ICD-10-CM | POA: Diagnosis not present

## 2015-11-18 DIAGNOSIS — M1712 Unilateral primary osteoarthritis, left knee: Secondary | ICD-10-CM | POA: Diagnosis not present

## 2015-11-30 DIAGNOSIS — M170 Bilateral primary osteoarthritis of knee: Secondary | ICD-10-CM | POA: Diagnosis not present

## 2015-12-07 ENCOUNTER — Encounter: Payer: Self-pay | Admitting: Internal Medicine

## 2015-12-07 ENCOUNTER — Telehealth: Payer: Self-pay | Admitting: Internal Medicine

## 2015-12-07 ENCOUNTER — Ambulatory Visit (HOSPITAL_BASED_OUTPATIENT_CLINIC_OR_DEPARTMENT_OTHER): Payer: Medicare HMO | Admitting: Internal Medicine

## 2015-12-07 ENCOUNTER — Other Ambulatory Visit (HOSPITAL_BASED_OUTPATIENT_CLINIC_OR_DEPARTMENT_OTHER): Payer: Medicare HMO

## 2015-12-07 VITALS — BP 130/60 | HR 88 | Temp 98.0°F | Resp 18 | Ht 71.0 in | Wt 214.2 lb

## 2015-12-07 DIAGNOSIS — C9000 Multiple myeloma not having achieved remission: Secondary | ICD-10-CM

## 2015-12-07 DIAGNOSIS — D649 Anemia, unspecified: Secondary | ICD-10-CM | POA: Diagnosis not present

## 2015-12-07 LAB — CBC WITH DIFFERENTIAL/PLATELET
BASO%: 0.7 % (ref 0.0–2.0)
BASOS ABS: 0 10*3/uL (ref 0.0–0.1)
EOS%: 1.6 % (ref 0.0–7.0)
Eosinophils Absolute: 0.1 10*3/uL (ref 0.0–0.5)
HCT: 35.4 % — ABNORMAL LOW (ref 38.4–49.9)
HEMOGLOBIN: 12.6 g/dL — AB (ref 13.0–17.1)
LYMPH%: 19.2 % (ref 14.0–49.0)
MCH: 32 pg (ref 27.2–33.4)
MCHC: 35.5 g/dL (ref 32.0–36.0)
MCV: 89.9 fL (ref 79.3–98.0)
MONO#: 0.6 10*3/uL (ref 0.1–0.9)
MONO%: 8.7 % (ref 0.0–14.0)
NEUT#: 5 10*3/uL (ref 1.5–6.5)
NEUT%: 69.8 % (ref 39.0–75.0)
Platelets: 216 10*3/uL (ref 140–400)
RBC: 3.94 10*6/uL — AB (ref 4.20–5.82)
RDW: 13 % (ref 11.0–14.6)
WBC: 7.2 10*3/uL (ref 4.0–10.3)
lymph#: 1.4 10*3/uL (ref 0.9–3.3)

## 2015-12-07 LAB — COMPREHENSIVE METABOLIC PANEL
ALBUMIN: 3.4 g/dL — AB (ref 3.5–5.0)
ALK PHOS: 62 U/L (ref 40–150)
ALT: 14 U/L (ref 0–55)
AST: 15 U/L (ref 5–34)
Anion Gap: 7 mEq/L (ref 3–11)
BILIRUBIN TOTAL: 0.66 mg/dL (ref 0.20–1.20)
BUN: 11.9 mg/dL (ref 7.0–26.0)
CO2: 29 mEq/L (ref 22–29)
CREATININE: 0.8 mg/dL (ref 0.7–1.3)
Calcium: 9.1 mg/dL (ref 8.4–10.4)
Chloride: 102 mEq/L (ref 98–109)
EGFR: 86 mL/min/{1.73_m2} — AB (ref >90)
GLUCOSE: 92 mg/dL (ref 70–140)
Potassium: 4 mEq/L (ref 3.5–5.1)
SODIUM: 139 meq/L (ref 136–145)
TOTAL PROTEIN: 8.3 g/dL (ref 6.4–8.3)

## 2015-12-07 LAB — LACTATE DEHYDROGENASE: LDH: 146 U/L (ref 125–245)

## 2015-12-07 NOTE — Progress Notes (Signed)
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   INTERVAL HISTORY: [name] 75 y.o. male returns to the clinic today for follow-up visit. The patient was diagnosed with smoldering multiple myeloma in August 2011. Bone marrow biopsy and aspirate at that time showed 15% plasma cells. The patient continued on observation with no active treatment since that time. He is feeling fine today with no specific complaints except arthralgia in the knees. He is very reluctant regarding need replacement and recently received steroid injection to his knees. He denied having any significant chest pain, shortness of breath, cough or hemoptysis. He intentionally lost few pounds recently. He denied having any fever or chills. He has no nausea or vomiting. The patient had repeat myeloma panel performed earlier today and he is here for evaluation and discussion of his lab results.  MEDICAL HISTORY: Past Medical History  Diagnosis Date  . CAD (coronary artery disease)     A.  s/p CFX in the past;   B.  cath 06/2010: LAD 30-40%, D1 50%, OM1 50%, AVCFX stent 30%, dCFX 70-80% (med Rx), mRCA 40%;      C.  Echo 06/2010: EF 60-65%, mod LVH; mild LAE     . Heart block   . 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 Hypertension   . Multiple myeloma   . Sleep apnea   . Degenerative disc disease   . Venous insufficiency   . Cubital tunnel syndrome   . Atrial flutter ([place])     A. s/p prior ablation;  B.  s/p CTI ablation 10/24/10 (Dr. Caryl Comes)  . Hx of adenomatous colonic polyps   . Hyperlipidemia     ALLERGIES:  has No Known Allergies.  MEDICATIONS:  Current Outpatient Prescriptions  Medication Sig Dispense Refill  . aspirin 81 MG tablet Take 81 mg  by mouth daily.      Marland Kitchen atenolol (TENORMIN) 50 MG tablet TAKE ONE TABLET BY MOUTH ONCE DAILY 90 tablet 2  . atorvastatin (LIPITOR) 10 MG tablet Take 10 mg by mouth daily.    . Glucosamine HCl 1000 MG TABS Take 2 tablets by mouth daily.    Marland Kitchen ketorolac (ACULAR) 0.4 % SOLN     . losartan (COZAAR) 100 MG tablet Take 100 mg by mouth daily.    . Multiple Vitamin (MULTIVITAMIN) capsule Take 1 capsule by mouth daily.      . NON FORMULARY CPAP Machine at night    . Omega-3 Fatty Acids (FISH OIL) 1200 MG CAPS Take 1 capsule by mouth daily.      Marland Kitchen thiamine 100 MG tablet Take 100 mg by mouth daily.    . nitroGLYCERIN (NITROSTAT) 0.4 MG SL tablet Place 0.4 mg under the tongue every 5 (five) minutes as needed for chest pain. Reported on 12/07/2015     No current facility-administered medications for this visit.    SURGICAL HISTORY:  Past Surgical History  Procedure Laterality Date  . Coronary angioplasty with stent placement  2005  . Ventral hernia repair    . Right olecranon nerve surgery    . Pacemaker placement  2011  . Colonoscopy  10-07-2009    TA polyp, tics, hems   . Polypectomy  REVIEW OF SYSTEMS:  A comprehensive review of systems was negative except for: Constitutional: positive for fatigue Musculoskeletal: positive for arthralgias   PHYSICAL EXAMINATION: General appearance: alert, cooperative, fatigued and no distress Head: Normocephalic, without obvious abnormality, atraumatic Neck: no adenopathy, no JVD, supple, symmetrical, trachea midline and thyroid not enlarged, symmetric, no tenderness/mass/nodules Lymph nodes: Cervical, supraclavicular, and axillary nodes normal. Resp: clear to auscultation bilaterally Back: symmetric, no curvature. ROM normal. No CVA tenderness. Cardio: regular rate and rhythm, S1, S2 normal, no murmur, click, rub or gallop GI: soft, non-tender; bowel sounds normal; no masses,  no organomegaly Extremities: extremities normal, atraumatic, no cyanosis or  edema Neurologic: Alert and oriented X 3, normal strength and tone. Normal symmetric reflexes. Normal coordination and gait  ECOG PERFORMANCE STATUS: 1 - Symptomatic but completely ambulatory  Blood pressure 130/60, pulse 88, temperature 98 F (36.7 C), temperature source Oral, resp. rate 18, height 5' 11"  (1.803 m), weight 214 lb 3.2 oz (97.16 kg), SpO2 100 %.  LABORATORY DATA: Lab Results  Component Value Date   WBC 7.2 12/07/2015   HGB 12.6* 12/07/2015   HCT 35.4* 12/07/2015   MCV 89.9 12/07/2015   PLT 216 12/07/2015      Chemistry      Component Value Date/Time   NA 139 12/07/2015 0917   NA 131* 04/29/2013 0900   K 4.0 12/07/2015 0917   K 4.1 04/29/2013 0900   CL 97 04/29/2013 0900   CL 97* 12/03/2012 1015   CO2 29 12/07/2015 0917   CO2 24 04/29/2013 0900   BUN 11.9 12/07/2015 0917   BUN 5* 04/29/2013 0900   CREATININE 0.8 12/07/2015 0917   CREATININE 0.48* 04/29/2013 0900      Component Value Date/Time   CALCIUM 9.1 12/07/2015 0917   CALCIUM 9.2 04/29/2013 0900   ALKPHOS 62 12/07/2015 0917   ALKPHOS 68 04/28/2013 1851   AST 15 12/07/2015 0917   AST 74* 04/28/2013 1851   ALT 14 12/07/2015 0917   ALT 52 04/28/2013 1851   BILITOT 0.66 12/07/2015 0917   BILITOT 0.6 04/28/2013 1851       RADIOGRAPHIC STUDIES: No results found.  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 The patient is doing fine today with no specific complaints. His myeloma panel showed no significant evidence for disease progression.  The CBC and comprehensive metabolic panel are unremarkable today except for mild anemia. The rest of the myeloma panel is still pending. I discussed the lab results with the patient today. I recommended for him to continue on observation with repeat myeloma panel in 6 months as it is no evidence for disease progression on the  pending blood work. He was advised to call immediately if he has any concerning symptoms in the interval. The patient voices understanding of current disease status and treatment options and is in agreement with the current care plan.  All questions were answered. The patient knows to call the clinic with any problems, questions or concerns. We can certainly see the patient much sooner if necessary.  Disclaimer: This note was dictated with voice recognition software. Similar sounding words can inadvertently be transcribed and may not be corrected upon review.

## 2015-12-07 NOTE — Telephone Encounter (Signed)
Gave and printed appt sched and avs for pt for Sept °

## 2015-12-08 LAB — IGG, IGA, IGM
IGA/IMMUNOGLOBULIN A, SERUM: 86 mg/dL (ref 61–437)
IGG (IMMUNOGLOBIN G), SERUM: 323 mg/dL — AB (ref 700–1600)

## 2015-12-08 LAB — KAPPA/LAMBDA LIGHT CHAINS
IG KAPPA FREE LIGHT CHAIN: 12.56 mg/L (ref 3.30–19.40)
IG LAMBDA FREE LIGHT CHAIN: 64.05 mg/L — AB (ref 5.71–26.30)
Kappa/Lambda FluidC Ratio: 0.2 — ABNORMAL LOW (ref 0.26–1.65)

## 2015-12-08 LAB — BETA 2 MICROGLOBULIN, SERUM: BETA 2: 1.4 mg/L (ref 0.6–2.4)

## 2015-12-14 ENCOUNTER — Ambulatory Visit (INDEPENDENT_AMBULATORY_CARE_PROVIDER_SITE_OTHER): Payer: Medicare HMO | Admitting: *Deleted

## 2015-12-14 DIAGNOSIS — I442 Atrioventricular block, complete: Secondary | ICD-10-CM | POA: Diagnosis not present

## 2015-12-14 DIAGNOSIS — I495 Sick sinus syndrome: Secondary | ICD-10-CM | POA: Diagnosis not present

## 2015-12-14 DIAGNOSIS — Z950 Presence of cardiac pacemaker: Secondary | ICD-10-CM

## 2015-12-14 NOTE — Progress Notes (Signed)
Remote pacemaker transmission.   

## 2016-01-07 DIAGNOSIS — M1712 Unilateral primary osteoarthritis, left knee: Secondary | ICD-10-CM | POA: Diagnosis not present

## 2016-01-07 DIAGNOSIS — M170 Bilateral primary osteoarthritis of knee: Secondary | ICD-10-CM | POA: Diagnosis not present

## 2016-01-07 DIAGNOSIS — M1711 Unilateral primary osteoarthritis, right knee: Secondary | ICD-10-CM | POA: Diagnosis not present

## 2016-01-14 DIAGNOSIS — M170 Bilateral primary osteoarthritis of knee: Secondary | ICD-10-CM | POA: Diagnosis not present

## 2016-01-21 DIAGNOSIS — M170 Bilateral primary osteoarthritis of knee: Secondary | ICD-10-CM | POA: Diagnosis not present

## 2016-02-01 LAB — CUP PACEART REMOTE DEVICE CHECK
Battery Remaining Longevity: 82 mo
Brady Statistic AP VS Percent: 1 %
Brady Statistic AS VS Percent: 1 %
Brady Statistic RA Percent Paced: 92 %
Date Time Interrogation Session: 20170403121441
Implantable Lead Location: 753859
Lead Channel Impedance Value: 410 Ohm
Lead Channel Pacing Threshold Amplitude: 0.875 V
Lead Channel Pacing Threshold Pulse Width: 0.4 ms
Lead Channel Pacing Threshold Pulse Width: 0.5 ms
MDC IDC LEAD IMPLANT DT: 20111021
MDC IDC LEAD IMPLANT DT: 20111021
MDC IDC LEAD LOCATION: 753860
MDC IDC MSMT BATTERY REMAINING PERCENTAGE: 73 %
MDC IDC MSMT BATTERY VOLTAGE: 2.92 V
MDC IDC MSMT LEADCHNL RA PACING THRESHOLD AMPLITUDE: 0.625 V
MDC IDC MSMT LEADCHNL RA SENSING INTR AMPL: 2.3 mV
MDC IDC MSMT LEADCHNL RV IMPEDANCE VALUE: 480 Ohm
MDC IDC MSMT LEADCHNL RV SENSING INTR AMPL: 12 mV
MDC IDC SET LEADCHNL RA PACING AMPLITUDE: 1.625
MDC IDC SET LEADCHNL RV PACING AMPLITUDE: 2 V
MDC IDC SET LEADCHNL RV PACING PULSEWIDTH: 0.5 ms
MDC IDC SET LEADCHNL RV SENSING SENSITIVITY: 2 mV
MDC IDC STAT BRADY AP VP PERCENT: 92 %
MDC IDC STAT BRADY AS VP PERCENT: 7.9 %
MDC IDC STAT BRADY RV PERCENT PACED: 99 %
Pulse Gen Model: 2110
Pulse Gen Serial Number: 7163077

## 2016-02-02 ENCOUNTER — Encounter: Payer: Self-pay | Admitting: Cardiology

## 2016-02-22 DIAGNOSIS — M170 Bilateral primary osteoarthritis of knee: Secondary | ICD-10-CM | POA: Diagnosis not present

## 2016-02-27 ENCOUNTER — Other Ambulatory Visit: Payer: Self-pay | Admitting: Cardiovascular Disease

## 2016-03-11 DIAGNOSIS — G4733 Obstructive sleep apnea (adult) (pediatric): Secondary | ICD-10-CM | POA: Diagnosis not present

## 2016-03-14 ENCOUNTER — Ambulatory Visit (INDEPENDENT_AMBULATORY_CARE_PROVIDER_SITE_OTHER): Payer: Medicare HMO | Admitting: *Deleted

## 2016-03-14 ENCOUNTER — Telehealth: Payer: Self-pay | Admitting: Cardiology

## 2016-03-14 DIAGNOSIS — I442 Atrioventricular block, complete: Secondary | ICD-10-CM

## 2016-03-14 NOTE — Progress Notes (Signed)
Remote pacemaker transmission.   

## 2016-03-14 NOTE — Telephone Encounter (Signed)
LMOVM reminding pt to send remote transmission.   

## 2016-03-17 LAB — CUP PACEART REMOTE DEVICE CHECK
Battery Remaining Longevity: 81 mo
Battery Remaining Percentage: 73 %
Brady Statistic AP VS Percent: 1 %
Brady Statistic AS VS Percent: 1 %
Implantable Lead Implant Date: 20111021
Lead Channel Impedance Value: 440 Ohm
Lead Channel Pacing Threshold Amplitude: 0.875 V
Lead Channel Pacing Threshold Pulse Width: 0.4 ms
Lead Channel Sensing Intrinsic Amplitude: 3.1 mV
Lead Channel Setting Pacing Pulse Width: 0.5 ms
Lead Channel Setting Sensing Sensitivity: 2 mV
MDC IDC LEAD IMPLANT DT: 20111021
MDC IDC LEAD LOCATION: 753859
MDC IDC LEAD LOCATION: 753860
MDC IDC MSMT BATTERY VOLTAGE: 2.92 V
MDC IDC MSMT LEADCHNL RA PACING THRESHOLD AMPLITUDE: 0.625 V
MDC IDC MSMT LEADCHNL RA PACING THRESHOLD PULSEWIDTH: 0.5 ms
MDC IDC MSMT LEADCHNL RV IMPEDANCE VALUE: 450 Ohm
MDC IDC MSMT LEADCHNL RV SENSING INTR AMPL: 12 mV
MDC IDC SESS DTM: 20170703184740
MDC IDC SET LEADCHNL RA PACING AMPLITUDE: 1.625
MDC IDC SET LEADCHNL RV PACING AMPLITUDE: 2 V
MDC IDC STAT BRADY AP VP PERCENT: 88 %
MDC IDC STAT BRADY AS VP PERCENT: 12 %
MDC IDC STAT BRADY RA PERCENT PACED: 88 %
MDC IDC STAT BRADY RV PERCENT PACED: 99 %
Pulse Gen Serial Number: 7163077

## 2016-03-18 ENCOUNTER — Encounter: Payer: Self-pay | Admitting: Cardiology

## 2016-03-29 ENCOUNTER — Encounter: Payer: Self-pay | Admitting: *Deleted

## 2016-04-13 NOTE — Progress Notes (Signed)
Patient ID: [name], male   DOB: 1941-07-11, 75 y.o.   MRN: [id number]   [name] is a 75 y.o. . male Seen in followup for atrial flutter status post ablation. He also has a history of complete heart block status post pacemakerin 2011 . He is without significant symptoms of chest pain shortness of breath or edema.  He has coronary artery disease - status post circumflex stent in the past.Cardiac catheterization in October 2011 demonstrated left anterior  descending 30% to 40% stenosis with other nonobstructive disease with medical therapy recommended.  Has Merlin machine to check pacer at home Hyponatremic on diuretic so stopped  Urologic issues resolved Saw Dr Risa Grill last month Needs new nitro   Has a plasma cell dyscrasia. Minor change in LFTls AST 46 No need to stop statin Wife  diagnosed with colon CA and had surgery with Dr Zella Richer  Some pain in LLE from varicose veins  Divorced from wife but living together She has a lot of health issues And sees GI at [facility] and broke her left arm recently   ROS: Denies fever, malais, weight loss, blurry vision, decreased visual acuity, cough, sputum, SOB, hemoptysis, pleuritic pain, palpitaitons, heartburn, abdominal pain, melena, lower extremity edema, claudication, or rash.  All other systems reviewed and negative  General: Affect appropriate Healthy:  appears stated age 3: normal Neck supple with no adenopathy JVP normal no bruits no thyromegaly Lungs clear with no wheezing and good diaphragmatic motion Heart:  S1/S2 no murmur, no rub, gallop or click pacer under left clavicle  PMI normal Abdomen: benighn, BS positve, no tenderness, no AAA  Ventral hernia no bruit.  No HSM or HJR Distal pulses intact with no bruits No edema marked varicose veins L>R into thighs  Neuro non-focal Skin warm and dry No muscular weakness   Current Outpatient Prescriptions  Medication Sig Dispense Refill  . aspirin 81 MG tablet Take  81 mg by mouth daily.      Marland Kitchen atenolol (TENORMIN) 50 MG tablet TAKE ONE TABLET BY MOUTH ONCE DAILY 90 tablet 0  . atorvastatin (LIPITOR) 10 MG tablet Take 10 mg by mouth daily.    . Glucosamine HCl 1000 MG TABS Take 2 tablets by mouth daily.    Marland Kitchen ketorolac (ACULAR) 0.4 % SOLN     . losartan (COZAAR) 100 MG tablet Take 100 mg by mouth daily.    . Multiple Vitamin (MULTIVITAMIN) capsule Take 1 capsule by mouth daily.      . nitroGLYCERIN (NITROSTAT) 0.4 MG SL tablet Place 0.4 mg under the tongue every 5 (five) minutes as needed for chest pain. Reported on 12/07/2015    . NON FORMULARY CPAP Machine at night    . Omega-3 Fatty Acids (FISH OIL) 1200 MG CAPS Take 1 capsule by mouth daily.      Marland Kitchen thiamine 100 MG tablet Take 100 mg by mouth daily.     No current facility-administered medications for this visit.     Allergies  Review of patient's allergies indicates no known allergies.  Electrocardiogram: 02/21/14   SR v pacing  03/20/15  AV pacing rate 72   Assessment and Plan Flutter:  Post ablation maint NSR Pacer:  Normal function continue Merlin transmission CAD:  Stable with no angina and good activity level.  Continue medical Rx Chol:   Cholesterol is at goal.  Continue current dose of statin and diet Rx.  No myalgias or side effects.  F/U  LFT's in 6 months.  Lab Results  Component Value Date   LDLCALC 102 (H) 10/17/2007  Labs with primary  HTN:  Well controlled.  Continue current medications and low sodium Dash type diet.   Plasma Cell Dyscrasia:  Seen by Dr Julien Nordmann in March Smoldering since 2011 with no Rx And just mild anemia. Myeloma labs to be done September Lab Results  Component Value Date   WBC 7.2 12/07/2015   HGB 12.6 (L) 12/07/2015   HCT 35.4 (L) 12/07/2015   MCV 89.9 12/07/2015   PLT 216 12/07/2015   Varicose Veins:  Gave him referral to Patrecia Pour at [facility] and vein         Jenkins Rouge

## 2016-04-15 ENCOUNTER — Encounter: Payer: Self-pay | Admitting: Cardiovascular Disease

## 2016-04-15 ENCOUNTER — Ambulatory Visit (INDEPENDENT_AMBULATORY_CARE_PROVIDER_SITE_OTHER): Payer: Medicare HMO | Admitting: Cardiovascular Disease

## 2016-04-15 VITALS — BP 127/71 | HR 75 | Ht 70.0 in | Wt 210.8 lb

## 2016-04-15 DIAGNOSIS — I2583 Coronary atherosclerosis due to lipid rich plaque: Principal | ICD-10-CM

## 2016-04-15 DIAGNOSIS — I2510 Atherosclerotic heart disease of native coronary artery without angina pectoris: Secondary | ICD-10-CM

## 2016-04-15 MED ORDER — NITROGLYCERIN 0.4 MG SL SUBL: 0.4000 mg | SUBLINGUAL_TABLET | SUBLINGUAL | 3 refills | Status: DC | PRN

## 2016-04-15 NOTE — Patient Instructions (Signed)

## 2016-04-15 NOTE — Addendum Note (Signed)
Addended by: Aris Georgia, Sascha Baugher L on: 04/15/2016 10:02 AM   Modules accepted: Orders

## 2016-05-23 ENCOUNTER — Telehealth: Payer: Self-pay | Admitting: *Deleted

## 2016-05-23 DIAGNOSIS — R69 Illness, unspecified: Secondary | ICD-10-CM | POA: Diagnosis not present

## 2016-05-23 DIAGNOSIS — H02834 Dermatochalasis of left upper eyelid: Secondary | ICD-10-CM | POA: Diagnosis not present

## 2016-05-23 DIAGNOSIS — H01021 Squamous blepharitis right upper eyelid: Secondary | ICD-10-CM | POA: Diagnosis not present

## 2016-05-23 DIAGNOSIS — H02831 Dermatochalasis of right upper eyelid: Secondary | ICD-10-CM | POA: Diagnosis not present

## 2016-05-23 DIAGNOSIS — H0289 Other specified disorders of eyelid: Secondary | ICD-10-CM | POA: Diagnosis not present

## 2016-05-23 DIAGNOSIS — Z961 Presence of intraocular lens: Secondary | ICD-10-CM | POA: Diagnosis not present

## 2016-05-23 DIAGNOSIS — H01022 Squamous blepharitis right lower eyelid: Secondary | ICD-10-CM | POA: Diagnosis not present

## 2016-05-23 DIAGNOSIS — H01025 Squamous blepharitis left lower eyelid: Secondary | ICD-10-CM | POA: Diagnosis not present

## 2016-05-23 DIAGNOSIS — H01024 Squamous blepharitis left upper eyelid: Secondary | ICD-10-CM | POA: Diagnosis not present

## 2016-05-23 NOTE — Telephone Encounter (Signed)
Reviewed with MD, ok for pt to receive pneumonia shot, pt will need to have this done at PCP office. Notified pt.

## 2016-05-23 NOTE — Telephone Encounter (Signed)
Pt called with request for pneumonia injection at his next appt. Last injection was aug 2012. Discussed with pt I will review with MD and call pt with additional information

## 2016-05-31 ENCOUNTER — Other Ambulatory Visit (HOSPITAL_BASED_OUTPATIENT_CLINIC_OR_DEPARTMENT_OTHER): Payer: Medicare HMO

## 2016-05-31 DIAGNOSIS — C9000 Multiple myeloma not having achieved remission: Secondary | ICD-10-CM | POA: Diagnosis not present

## 2016-05-31 DIAGNOSIS — I83893 Varicose veins of bilateral lower extremities with other complications: Secondary | ICD-10-CM | POA: Diagnosis not present

## 2016-05-31 LAB — CBC WITH DIFFERENTIAL/PLATELET
BASO%: 0.7 % (ref 0.0–2.0)
Basophils Absolute: 0 10*3/uL (ref 0.0–0.1)
EOS ABS: 0.3 10*3/uL (ref 0.0–0.5)
EOS%: 4.1 % (ref 0.0–7.0)
HCT: 41 % (ref 38.4–49.9)
HGB: 14.5 g/dL (ref 13.0–17.1)
LYMPH%: 17.3 % (ref 14.0–49.0)
MCH: 33.9 pg — AB (ref 27.2–33.4)
MCHC: 35.3 g/dL (ref 32.0–36.0)
MCV: 95.9 fL (ref 79.3–98.0)
MONO#: 0.7 10*3/uL (ref 0.1–0.9)
MONO%: 10.7 % (ref 0.0–14.0)
NEUT#: 4.2 10*3/uL (ref 1.5–6.5)
NEUT%: 67.2 % (ref 39.0–75.0)
PLATELETS: 200 10*3/uL (ref 140–400)
RBC: 4.27 10*6/uL (ref 4.20–5.82)
RDW: 13.7 % (ref 11.0–14.6)
WBC: 6.2 10*3/uL (ref 4.0–10.3)
lymph#: 1.1 10*3/uL (ref 0.9–3.3)

## 2016-05-31 LAB — COMPREHENSIVE METABOLIC PANEL
ALT: 70 U/L — ABNORMAL HIGH (ref 0–55)
ANION GAP: 11 meq/L (ref 3–11)
AST: 69 U/L — ABNORMAL HIGH (ref 5–34)
Albumin: 3.4 g/dL — ABNORMAL LOW (ref 3.5–5.0)
Alkaline Phosphatase: 87 U/L (ref 40–150)
BILIRUBIN TOTAL: 0.99 mg/dL (ref 0.20–1.20)
BUN: 4.3 mg/dL — ABNORMAL LOW (ref 7.0–26.0)
CHLORIDE: 98 meq/L (ref 98–109)
CO2: 26 meq/L (ref 22–29)
Calcium: 9.3 mg/dL (ref 8.4–10.4)
Creatinine: 0.7 mg/dL (ref 0.7–1.3)
Glucose: 96 mg/dl (ref 70–140)
POTASSIUM: 4.2 meq/L (ref 3.5–5.1)
Sodium: 134 mEq/L — ABNORMAL LOW (ref 136–145)
TOTAL PROTEIN: 8.2 g/dL (ref 6.4–8.3)

## 2016-05-31 LAB — LACTATE DEHYDROGENASE: LDH: 178 U/L (ref 125–245)

## 2016-06-01 ENCOUNTER — Other Ambulatory Visit: Payer: Self-pay | Admitting: Cardiovascular Disease

## 2016-06-01 LAB — BETA 2 MICROGLOBULIN, SERUM: BETA 2: 1.3 mg/L (ref 0.6–2.4)

## 2016-06-01 LAB — IGG, IGA, IGM
IgA, Qn, Serum: 91 mg/dL (ref 61–437)
IgG, Qn, Serum: 356 mg/dL — ABNORMAL LOW (ref 700–1600)
IgM, Qn, Serum: 3652 mg/dL — ABNORMAL HIGH (ref 15–143)

## 2016-06-01 LAB — KAPPA/LAMBDA LIGHT CHAINS
IG KAPPA FREE LIGHT CHAIN: 10.9 mg/L (ref 3.3–19.4)
IG LAMBDA FREE LIGHT CHAIN: 51.9 mg/L — AB (ref 5.7–26.3)
Kappa/Lambda FluidC Ratio: 0.21 — ABNORMAL LOW (ref 0.26–1.65)

## 2016-06-03 ENCOUNTER — Telehealth: Payer: Self-pay | Admitting: Internal Medicine

## 2016-06-03 NOTE — Telephone Encounter (Signed)
New message    Pt called to schedule appt w/Dr. Caryl Comes but nothing was available in the mornings. Pt would like to be seen in the morning b/c he does not drive in the afternoons. I offered Jan times in the afternoon but nothing else was available and he is not interested in seeing a PA. Please call.

## 2016-06-03 NOTE — Telephone Encounter (Signed)
Melissa,   Can you check on the schedule for this patient please. Thanks!

## 2016-06-06 ENCOUNTER — Ambulatory Visit (HOSPITAL_BASED_OUTPATIENT_CLINIC_OR_DEPARTMENT_OTHER): Payer: Medicare HMO | Admitting: Internal Medicine

## 2016-06-06 ENCOUNTER — Encounter: Payer: Self-pay | Admitting: Internal Medicine

## 2016-06-06 ENCOUNTER — Telehealth: Payer: Self-pay | Admitting: Internal Medicine

## 2016-06-06 VITALS — BP 149/73 | HR 83 | Temp 98.1°F | Resp 17 | Ht 70.0 in | Wt 208.0 lb

## 2016-06-06 DIAGNOSIS — C9000 Multiple myeloma not having achieved remission: Secondary | ICD-10-CM

## 2016-06-06 DIAGNOSIS — I83893 Varicose veins of bilateral lower extremities with other complications: Secondary | ICD-10-CM | POA: Diagnosis not present

## 2016-06-06 NOTE — Telephone Encounter (Signed)
Gave patient avs report and appointments for March  °

## 2016-06-06 NOTE — Progress Notes (Signed)
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   INTERVAL HISTORY: [name] 75 y.o. male returns to the clinic today for follow-up visit. The patient was diagnosed with smoldering multiple myeloma in August 2011. Bone marrow biopsy and aspirate at that time showed 15% plasma cells. The patient has been on observation with no active treatment since that time. He denied having any significant complaints today. He denied having any significant chest pain, shortness of breath, cough or hemoptysis. He intentionally lost 6 more pounds since his last visit. He denied having any fever or chills. He has no nausea or vomiting. 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 The patient had repeat myeloma panel performed earlier today and he is here for evaluation and discussion of his lab results.  MEDICAL HISTORY: Past Medical History:  Diagnosis Date  . Atrial flutter ([place])    A. s/p prior ablation;  B.  s/p CTI ablation 10/24/10 (Dr. Caryl Comes)  . CAD (coronary artery disease)    A.  s/p CFX in the past;   B.  cath 06/2010: LAD 30-40%, D1 50%, OM1 50%, AVCFX stent 30%, dCFX 70-80% (med Rx), mRCA 40%;      C.  Echo 06/2010: EF 60-65%, mod LVH; mild LAE     . Cubital tunnel syndrome   . Degenerative disc disease   . Heart block   . Hx of adenomatous colonic polyps   . Hyperlipidemia   . Hypertension   . Multiple myeloma   . Sleep apnea   . 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 Venous insufficiency     ALLERGIES:  has No Known Allergies.  MEDICATIONS:  Current Outpatient Prescriptions  Medication Sig Dispense Refill  . aspirin 81 MG tablet Take 81 mg by mouth daily.      Marland Kitchen atenolol (TENORMIN) 50 MG tablet TAKE ONE TABLET BY  MOUTH ONCE DAILY 90 tablet 3  . atorvastatin (LIPITOR) 10 MG tablet Take 10 mg by mouth daily.    Marland Kitchen FLUZONE HIGH-DOSE 0.5 ML SUSY     . Glucosamine HCl 1000 MG TABS Take 2 tablets by mouth daily.    Marland Kitchen ketorolac (ACULAR) 0.4 % SOLN     . losartan (COZAAR) 100 MG tablet Take 100 mg by mouth daily.    . Multiple Vitamin (MULTIVITAMIN) capsule Take 1 capsule by mouth daily.      . nitroGLYCERIN (NITROSTAT) 0.4 MG SL tablet Place 1 tablet (0.4 mg total) under the tongue every 5 (five) minutes as needed for chest pain. Reported on 12/07/2015 25 tablet 3  . NON FORMULARY CPAP Machine at night    . Omega-3 Fatty Acids (FISH OIL) 1200 MG CAPS Take 1 capsule by mouth daily.      Marland Kitchen PREVNAR 13 SUSP injection     . thiamine 100 MG tablet Take 100 mg by mouth daily.     No current facility-administered medications for this visit.     SURGICAL HISTORY:  Past Surgical History:  Procedure Laterality Date  . COLONOSCOPY  10-07-2009   TA polyp, tics, hems   . CORONARY ANGIOPLASTY WITH STENT PLACEMENT  2005  . PACEMAKER PLACEMENT  2011  . POLYPECTOMY    . Right olecranon nerve surgery    .  VENTRAL HERNIA REPAIR      REVIEW OF SYSTEMS:  A comprehensive review of systems was negative except for: Constitutional: positive for fatigue Musculoskeletal: positive for arthralgias   PHYSICAL EXAMINATION: General appearance: alert, cooperative, fatigued and no distress Head: Normocephalic, without obvious abnormality, atraumatic Neck: no adenopathy, no JVD, supple, symmetrical, trachea midline and thyroid not enlarged, symmetric, no tenderness/mass/nodules Lymph nodes: Cervical, supraclavicular, and axillary nodes normal. Resp: clear to auscultation bilaterally Back: symmetric, no curvature. ROM normal. No CVA tenderness. Cardio: regular rate and rhythm, S1, S2 normal, no murmur, click, rub or gallop GI: soft, non-tender; bowel sounds normal; no masses,  no organomegaly Extremities: extremities normal,  atraumatic, no cyanosis or edema Neurologic: Alert and oriented X 3, normal strength and tone. Normal symmetric reflexes. Normal coordination and gait  ECOG PERFORMANCE STATUS: 1 - Symptomatic but completely ambulatory  Blood pressure (!) 149/73, pulse 83, temperature 98.1 F (36.7 C), temperature source Oral, resp. rate 17, height _0  (1.778 m), weight 208 lb (94.3 kg), SpO2 99 %.  LABORATORY DATA: Lab Results  Component Value Date   WBC 6.2 05/31/2016   HGB 14.5 05/31/2016   HCT 41.0 05/31/2016   MCV 95.9 05/31/2016   PLT 200 05/31/2016      Chemistry      Component Value Date/Time   NA 134 (L) 05/31/2016 0901   K 4.2 05/31/2016 0901   CL 97 04/29/2013 0900   CL 97 (L) 12/03/2012 1015   CO2 26 05/31/2016 0901   BUN 4.3 (L) 05/31/2016 0901   CREATININE 0.7 05/31/2016 0901      Component Value Date/Time   CALCIUM 9.3 05/31/2016 0901   ALKPHOS 87 05/31/2016 0901   AST 69 (H) 05/31/2016 0901   ALT 70 (H) 05/31/2016 0901   BILITOT 0.99 05/31/2016 0901       RADIOGRAPHIC STUDIES: No results found.  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 The patient is doing fine today with no specific complaints. His myeloma panel showed no significant evidence for disease progression.  His myeloma panel is stable to mild improvement. I discussed the lab results with the patient today. I recommended for him to continue on observation with repeat myeloma panel in 6 months as it is no evidence for disease progression on the pending blood work. He was advised to call immediately if he has any concerning symptoms in the interval. The patient voices understanding of current disease status and treatment options and is in agreement with the current care plan.  All questions were answered. The patient knows to call the clinic with any problems, questions  or concerns. We can certainly see the patient much sooner if necessary.  Disclaimer: This note was dictated with voice recognition software. Similar sounding words can inadvertently be transcribed and may not be corrected upon review.

## 2016-06-07 ENCOUNTER — Ambulatory Visit: Payer: Medicare HMO | Admitting: Internal Medicine

## 2016-06-13 ENCOUNTER — Ambulatory Visit (INDEPENDENT_AMBULATORY_CARE_PROVIDER_SITE_OTHER): Payer: Medicare HMO | Admitting: *Deleted

## 2016-06-13 DIAGNOSIS — I442 Atrioventricular block, complete: Secondary | ICD-10-CM | POA: Diagnosis not present

## 2016-06-13 NOTE — Progress Notes (Signed)
Remote pacemaker transmission.   

## 2016-06-15 ENCOUNTER — Encounter: Payer: Self-pay | Admitting: Cardiology

## 2016-06-21 DIAGNOSIS — I8311 Varicose veins of right lower extremity with inflammation: Secondary | ICD-10-CM | POA: Diagnosis not present

## 2016-06-21 DIAGNOSIS — I8312 Varicose veins of left lower extremity with inflammation: Secondary | ICD-10-CM | POA: Diagnosis not present

## 2016-06-23 LAB — CUP PACEART REMOTE DEVICE CHECK
Battery Voltage: 2.9 V
Brady Statistic AP VP Percent: 87 %
Brady Statistic AS VP Percent: 13 %
Brady Statistic RA Percent Paced: 87 %
Date Time Interrogation Session: 20171002120956
Implantable Lead Location: 753860
Lead Channel Impedance Value: 490 Ohm
Lead Channel Pacing Threshold Amplitude: 0.5 V
Lead Channel Pacing Threshold Pulse Width: 0.4 ms
Lead Channel Pacing Threshold Pulse Width: 0.5 ms
Lead Channel Sensing Intrinsic Amplitude: 12 mV
Lead Channel Setting Pacing Amplitude: 1.5 V
Lead Channel Setting Pacing Amplitude: 2 V
MDC IDC LEAD IMPLANT DT: 20111021
MDC IDC LEAD IMPLANT DT: 20111021
MDC IDC LEAD LOCATION: 753859
MDC IDC MSMT BATTERY REMAINING LONGEVITY: 73 mo
MDC IDC MSMT BATTERY REMAINING PERCENTAGE: 65 %
MDC IDC MSMT LEADCHNL RA IMPEDANCE VALUE: 450 Ohm
MDC IDC MSMT LEADCHNL RA SENSING INTR AMPL: 2.9 mV
MDC IDC MSMT LEADCHNL RV PACING THRESHOLD AMPLITUDE: 0.875 V
MDC IDC SET LEADCHNL RV PACING PULSEWIDTH: 0.5 ms
MDC IDC SET LEADCHNL RV SENSING SENSITIVITY: 2 mV
MDC IDC STAT BRADY AP VS PERCENT: 1 %
MDC IDC STAT BRADY AS VS PERCENT: 1 %
MDC IDC STAT BRADY RV PERCENT PACED: 99 %
Pulse Gen Model: 2110
Pulse Gen Serial Number: 7163077

## 2016-09-01 ENCOUNTER — Encounter (INDEPENDENT_AMBULATORY_CARE_PROVIDER_SITE_OTHER): Payer: Self-pay

## 2016-09-01 ENCOUNTER — Ambulatory Visit (INDEPENDENT_AMBULATORY_CARE_PROVIDER_SITE_OTHER): Payer: Medicare HMO | Admitting: Internal Medicine

## 2016-09-01 ENCOUNTER — Encounter: Payer: Self-pay | Admitting: Internal Medicine

## 2016-09-01 VITALS — BP 122/60 | HR 81 | Ht 72.0 in | Wt 213.0 lb

## 2016-09-01 DIAGNOSIS — Z950 Presence of cardiac pacemaker: Secondary | ICD-10-CM

## 2016-09-01 DIAGNOSIS — L570 Actinic keratosis: Secondary | ICD-10-CM | POA: Diagnosis not present

## 2016-09-01 DIAGNOSIS — D225 Melanocytic nevi of trunk: Secondary | ICD-10-CM | POA: Diagnosis not present

## 2016-09-01 DIAGNOSIS — C44319 Basal cell carcinoma of skin of other parts of face: Secondary | ICD-10-CM | POA: Diagnosis not present

## 2016-09-01 DIAGNOSIS — C4441 Basal cell carcinoma of skin of scalp and neck: Secondary | ICD-10-CM | POA: Diagnosis not present

## 2016-09-01 DIAGNOSIS — I442 Atrioventricular block, complete: Secondary | ICD-10-CM | POA: Diagnosis not present

## 2016-09-01 DIAGNOSIS — L814 Other melanin hyperpigmentation: Secondary | ICD-10-CM | POA: Diagnosis not present

## 2016-09-01 DIAGNOSIS — I480 Paroxysmal atrial fibrillation: Secondary | ICD-10-CM | POA: Diagnosis not present

## 2016-09-01 DIAGNOSIS — C44321 Squamous cell carcinoma of skin of nose: Secondary | ICD-10-CM | POA: Diagnosis not present

## 2016-09-01 DIAGNOSIS — D1801 Hemangioma of skin and subcutaneous tissue: Secondary | ICD-10-CM | POA: Diagnosis not present

## 2016-09-01 NOTE — Progress Notes (Signed)
Patient Care Team: [name], MD as PCP - General (Internal Medicine) Jeanie Cooks, MD (Hematology and Oncology)   HPI  [name] is a 75 y.o. male seen in followup for a history of complete heart block status post pacemaker 2011   He has a history of prior catheter ablation for atrial flutter. Hx coronary artery disease with prior stenting.  Cardiac catheterization in October 2011 demonstrated left anterior descending 30% to 40% stenosis with other nonobstructive disease with medical therapy recommended.  He has had no symptomatic atrial fibrillation    Myoview 1/17  EF 65  No ischemia   His balance is also better following rehab  He continues with orthostatic intolerance but this is some better.  Past Medical History:  Diagnosis Date  . Atrial flutter ([place])    A. s/p prior ablation;  B.  s/p CTI ablation 10/24/10 (Dr. Caryl Comes)  . CAD (coronary artery disease)    A.  s/p CFX in the past;   B.  cath 06/2010: LAD 30-40%, D1 50%, OM1 50%, AVCFX stent 30%, dCFX 70-80% (med Rx), mRCA 40%;      C.  Echo 06/2010: EF 60-65%, mod LVH; mild LAE     . Cubital tunnel syndrome   . Degenerative disc disease   . Heart block   . Hx of adenomatous colonic polyps   . Hyperlipidemia   . Hypertension   . Multiple myeloma   . Sleep apnea   . 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 Venous insufficiency     Past Surgical History:  Procedure Laterality Date  . COLONOSCOPY  10-07-2009   TA polyp, tics, hems   . CORONARY ANGIOPLASTY WITH STENT PLACEMENT  2005  . PACEMAKER PLACEMENT  2011  . POLYPECTOMY    . Right olecranon nerve surgery    . VENTRAL HERNIA REPAIR      Current Outpatient Prescriptions  Medication Sig Dispense Refill  . aspirin 81 MG tablet Take 81 mg by mouth daily.      Marland Kitchen atenolol (TENORMIN) 50 MG tablet TAKE ONE TABLET BY MOUTH ONCE DAILY 90 tablet 3  . atorvastatin (LIPITOR) 10 MG tablet Take 10 mg by mouth daily.    .  Glucosamine HCl 1000 MG TABS Take 2 tablets by mouth daily.    Marland Kitchen ketorolac (ACULAR) 0.4 % SOLN     . losartan (COZAAR) 100 MG tablet Take 100 mg by mouth daily.    . Multiple Vitamin (MULTIVITAMIN) capsule Take 1 capsule by mouth daily.      . nitroGLYCERIN (NITROSTAT) 0.4 MG SL tablet Place 1 tablet (0.4 mg total) under the tongue every 5 (five) minutes as needed for chest pain. Reported on 12/07/2015 25 tablet 3  . NON FORMULARY CPAP Machine at night    . Omega-3 Fatty Acids (FISH OIL) 1200 MG CAPS Take 1 capsule by mouth daily.      Marland Kitchen thiamine 100 MG tablet Take 100 mg by mouth daily.     No current facility-administered medications for this visit.     No Known Allergies  Review of Systems negative except from HPI and PMH  Physical Exam   BP 122/60   Pulse 81   Ht 6' (1.829 m)   Wt 213 lb (96.6 kg)   SpO2 97%   BMI 28.89 kg/m  Well developed and well nourished in no acute distress HENT normal E scleral and icterus clear  Neck Supple JVP flat; carotids brisk and full Clear to ausculation Device pocket well healed; without hematoma or erythema.  There is no tethering Regular rate and rhythm, no murmurs gallops or rub Soft with active bowel sounds No clubbing cyanosis trace Edema Alert and oriented, grossly normal motor and sensory function; he is unable to stand with his feet together. He is unable to walk a straight line. Skin Warm and Dry  ECG AV pacing   Assessment and plan   Complete heart block   Pacemaker implantation-St. Jude   Orthostatic lightheadedness  Atrial fibrillation    The patient's heart rhythm issues are stable. Device function is normal.   He has atrial fibrillation detected on his device of greater than 12 hours. We will continue him on a NOAC therapy. We have given him the lis he will chekh his plan as per medication. We will discontinue his aspirin at that time.    Creatinine was 0.7   9/17.  Orthostatic lightheadedness is better.

## 2016-09-01 NOTE — Patient Instructions (Signed)
Medication Instructions: - Your physician recommends that you continue on your current medications as directed. Please refer to the Current Medication list given to you today.  Labwork: - none ordered  Procedures/Testing: - none ordered  Follow-Up: - Remote monitoring is used to monitor your Pacemaker of ICD from home. This monitoring reduces the number of office visits required to check your device to one time per year. It allows [place] to keep an eye on the functioning of your device to ensure it is working properly. You are scheduled for a device check from home on 12/01/16. You may send your transmission at any time that day. If you have a wireless device, the transmission will be sent automatically. After your physician reviews your transmission, you will receive a postcard with your next transmission date.  - Your physician wants you to follow-up in: 1 year with Chanetta Marshall, NP for Dr. Caryl Comes. You will receive a reminder letter in the mail two months in advance. If you don't receive a letter, please call our office to schedule the follow-up appointment.  Any Additional Special Instructions Will Be Listed Below (If Applicable). - Call Dr. Olin Pia nurse, Nira Conn, at [phone] if you decided on a blood thinner 1) Eliquis - twice daily 2) Pradaxa- twice daily 3) Xarelto- once daily    If you need a refill on your cardiac medications before your next appointment, please call your pharmacy.

## 2016-09-02 ENCOUNTER — Telehealth: Payer: Self-pay | Admitting: Internal Medicine

## 2016-09-02 MED ORDER — RIVAROXABAN 20 MG PO TABS: 20.0000 mg | ORAL_TABLET | Freq: Every day | ORAL | 0 refills | Status: DC

## 2016-09-02 NOTE — Telephone Encounter (Signed)
MAY AS well start now; we can give him the card or samples thnks merry christmas

## 2016-09-02 NOTE — Telephone Encounter (Signed)
Pt returning call to [place] call [phone]

## 2016-09-02 NOTE — Telephone Encounter (Signed)
Spoke with pt, aware he will need to stop aspirin and start xarelto 20 mg once daily with food. Savings card placed at the front desk for patient to get this prescription for free. He will then let us know where to send the prescription after the first of the year.

## 2016-09-02 NOTE — Telephone Encounter (Signed)
Spoke with [name], he reports xarelto would be the best NOAC in regards to his insurance. They recommend he wait to start the prescription after the first of the year. He wants to know from dr Caryl Comes if he is okay to wwait until then. If not we can try to provide samples to help him until then. Will forward for dr klein's review and advise.

## 2016-09-02 NOTE — Telephone Encounter (Signed)
Patient states he saw Dr. Caryl Comes yesterday and a new heart medication was discussed.  He was told to check with his insurance company regarding coverage and he has that information. Please call.

## 2016-09-02 NOTE — Telephone Encounter (Signed)
Left message for pt to call.

## 2016-09-06 LAB — CUP PACEART INCLINIC DEVICE CHECK
Brady Statistic RV Percent Paced: 99.95 %
Date Time Interrogation Session: 20171221141637
Implantable Lead Implant Date: 20111021
Implantable Lead Location: 753859
Implantable Pulse Generator Implant Date: 20111021
Lead Channel Impedance Value: 450 Ohm
Lead Channel Pacing Threshold Pulse Width: 0.5 ms
Lead Channel Sensing Intrinsic Amplitude: 12 mV
Lead Channel Sensing Intrinsic Amplitude: 2.1 mV
Lead Channel Setting Pacing Amplitude: 1.5 V
Lead Channel Setting Pacing Pulse Width: 0.5 ms
MDC IDC LEAD IMPLANT DT: 20111021
MDC IDC LEAD LOCATION: 753860
MDC IDC MSMT BATTERY VOLTAGE: 2.89 V
MDC IDC MSMT LEADCHNL RA IMPEDANCE VALUE: 410 Ohm
MDC IDC MSMT LEADCHNL RA PACING THRESHOLD AMPLITUDE: 0.5 V
MDC IDC MSMT LEADCHNL RA PACING THRESHOLD PULSEWIDTH: 0.5 ms
MDC IDC MSMT LEADCHNL RV PACING THRESHOLD AMPLITUDE: 1 V
MDC IDC PG SERIAL: 7163077
MDC IDC SET LEADCHNL RV PACING AMPLITUDE: 2.5 V
MDC IDC SET LEADCHNL RV SENSING SENSITIVITY: 2 mV
MDC IDC STAT BRADY RA PERCENT PACED: 85 %
Pulse Gen Model: 2110

## 2016-09-21 ENCOUNTER — Other Ambulatory Visit: Payer: Self-pay | Admitting: *Deleted

## 2016-09-21 DIAGNOSIS — I10 Essential (primary) hypertension: Secondary | ICD-10-CM | POA: Diagnosis not present

## 2016-09-21 DIAGNOSIS — H6123 Impacted cerumen, bilateral: Secondary | ICD-10-CM | POA: Diagnosis not present

## 2016-09-21 DIAGNOSIS — Z6829 Body mass index (BMI) 29.0-29.9, adult: Secondary | ICD-10-CM | POA: Diagnosis not present

## 2016-09-21 DIAGNOSIS — R7301 Impaired fasting glucose: Secondary | ICD-10-CM | POA: Diagnosis not present

## 2016-09-21 DIAGNOSIS — H9193 Unspecified hearing loss, bilateral: Secondary | ICD-10-CM | POA: Diagnosis not present

## 2016-09-21 DIAGNOSIS — L570 Actinic keratosis: Secondary | ICD-10-CM | POA: Diagnosis not present

## 2016-09-21 DIAGNOSIS — Z125 Encounter for screening for malignant neoplasm of prostate: Secondary | ICD-10-CM | POA: Diagnosis not present

## 2016-09-21 DIAGNOSIS — E784 Other hyperlipidemia: Secondary | ICD-10-CM | POA: Diagnosis not present

## 2016-09-21 MED ORDER — RIVAROXABAN 20 MG PO TABS: 20.0000 mg | ORAL_TABLET | Freq: Every day | ORAL | 3 refills | Status: DC

## 2016-09-22 ENCOUNTER — Other Ambulatory Visit: Payer: Self-pay | Admitting: Internal Medicine

## 2016-09-26 DIAGNOSIS — C44319 Basal cell carcinoma of skin of other parts of face: Secondary | ICD-10-CM | POA: Diagnosis not present

## 2016-09-26 DIAGNOSIS — Z85828 Personal history of other malignant neoplasm of skin: Secondary | ICD-10-CM | POA: Diagnosis not present

## 2016-09-26 DIAGNOSIS — C44321 Squamous cell carcinoma of skin of nose: Secondary | ICD-10-CM | POA: Diagnosis not present

## 2016-09-27 DIAGNOSIS — M170 Bilateral primary osteoarthritis of knee: Secondary | ICD-10-CM | POA: Diagnosis not present

## 2016-10-04 DIAGNOSIS — M170 Bilateral primary osteoarthritis of knee: Secondary | ICD-10-CM | POA: Diagnosis not present

## 2016-10-04 DIAGNOSIS — D0439 Carcinoma in situ of skin of other parts of face: Secondary | ICD-10-CM | POA: Diagnosis not present

## 2016-10-04 DIAGNOSIS — C4441 Basal cell carcinoma of skin of scalp and neck: Secondary | ICD-10-CM | POA: Diagnosis not present

## 2016-10-04 DIAGNOSIS — Z85828 Personal history of other malignant neoplasm of skin: Secondary | ICD-10-CM | POA: Diagnosis not present

## 2016-10-05 ENCOUNTER — Telehealth: Payer: Self-pay | Admitting: Internal Medicine

## 2016-10-05 NOTE — Telephone Encounter (Signed)
Returned patient's call.  Patient stated that his largest meal is in the mid-afternoon and doesn't eat a dinner.  I advised him to take the xarelto with the mid-afternoon meal.  He expressed understanding.

## 2016-10-05 NOTE — Telephone Encounter (Signed)
New Message     Can pt take Xarelto with lunch since he does not eat dinner??

## 2016-10-10 DIAGNOSIS — Z950 Presence of cardiac pacemaker: Secondary | ICD-10-CM | POA: Diagnosis not present

## 2016-10-10 DIAGNOSIS — C9000 Multiple myeloma not having achieved remission: Secondary | ICD-10-CM | POA: Diagnosis not present

## 2016-10-10 DIAGNOSIS — E784 Other hyperlipidemia: Secondary | ICD-10-CM | POA: Diagnosis not present

## 2016-10-10 DIAGNOSIS — I480 Paroxysmal atrial fibrillation: Secondary | ICD-10-CM | POA: Diagnosis not present

## 2016-10-10 DIAGNOSIS — Z0000 Encounter for general adult medical examination without abnormal findings: Secondary | ICD-10-CM | POA: Diagnosis not present

## 2016-10-10 DIAGNOSIS — G4730 Sleep apnea, unspecified: Secondary | ICD-10-CM | POA: Diagnosis not present

## 2016-10-10 DIAGNOSIS — I10 Essential (primary) hypertension: Secondary | ICD-10-CM | POA: Diagnosis not present

## 2016-10-10 DIAGNOSIS — L570 Actinic keratosis: Secondary | ICD-10-CM | POA: Diagnosis not present

## 2016-10-10 DIAGNOSIS — D0439 Carcinoma in situ of skin of other parts of face: Secondary | ICD-10-CM | POA: Diagnosis not present

## 2016-10-10 DIAGNOSIS — R7301 Impaired fasting glucose: Secondary | ICD-10-CM | POA: Diagnosis not present

## 2016-10-10 DIAGNOSIS — R748 Abnormal levels of other serum enzymes: Secondary | ICD-10-CM | POA: Diagnosis not present

## 2016-10-10 DIAGNOSIS — Z85828 Personal history of other malignant neoplasm of skin: Secondary | ICD-10-CM | POA: Diagnosis not present

## 2016-10-10 DIAGNOSIS — I25118 Atherosclerotic heart disease of native coronary artery with other forms of angina pectoris: Secondary | ICD-10-CM | POA: Diagnosis not present

## 2016-10-11 DIAGNOSIS — M170 Bilateral primary osteoarthritis of knee: Secondary | ICD-10-CM | POA: Diagnosis not present

## 2016-10-12 DIAGNOSIS — H6123 Impacted cerumen, bilateral: Secondary | ICD-10-CM | POA: Diagnosis not present

## 2016-10-26 DIAGNOSIS — G4733 Obstructive sleep apnea (adult) (pediatric): Secondary | ICD-10-CM | POA: Diagnosis not present

## 2016-10-28 ENCOUNTER — Encounter: Payer: Self-pay | Admitting: Cardiovascular Disease

## 2016-10-28 NOTE — Progress Notes (Signed)
Patient ID: [name], male   DOB: June 13, 1941, 76 y.o.   MRN: [id number]   [name] is a 76 y.o. . male Seen in followup for atrial flutter status post ablation. He also has a history of complete heart block status post pacemakerin 2011 . He is without significant symptoms of chest pain shortness of breath or edema.  He has coronary artery disease - status post circumflex stent in the past.Cardiac catheterization in October 2011 demonstrated left anterior  descending 30% to 40% stenosis with other nonobstructive disease with medical therapy recommended.  Has Merlin machine to check pacer at home Hyponatremic on diuretic so stopped    Has a plasma cell dyscrasia.   Some pain in LLE from varicose veins  Divorced from wife but living together She has a lot of health issues And sees GI at [facility] Dr Radene Gunning and LDL 58 with mildly elevated LFT;s AST 88 ALT 59   Thought due to fatty liver and ETOH not statin   ROS: Denies fever, malais, weight loss, blurry vision, decreased visual acuity, cough, sputum, SOB, hemoptysis, pleuritic pain, palpitaitons, heartburn, abdominal pain, melena, lower extremity edema, claudication, or rash.  All other systems reviewed and negative  General: Affect appropriate Healthy:  appears stated age 76: normal Neck supple with no adenopathy JVP normal no bruits no thyromegaly Lungs clear with no wheezing and good diaphragmatic motion Heart:  S1/S2 no murmur, no rub, gallop or click pacer under left clavicle  PMI normal Abdomen: benighn, BS positve, no tenderness, no AAA  Ventral hernia no bruit.  No HSM or HJR Distal pulses intact with no bruits No edema marked varicose veins L>R into thighs  Neuro non-focal Skin warm and dry varicose veins LLE worse than right  No muscular weakness   Current Outpatient Prescriptions  Medication Sig Dispense Refill  . atenolol (TENORMIN) 50 MG tablet TAKE ONE TABLET BY MOUTH ONCE DAILY 90 tablet 3   . atorvastatin (LIPITOR) 10 MG tablet Take 10 mg by mouth daily.    . Glucosamine HCl 1000 MG TABS Take 2 tablets by mouth daily.    Marland Kitchen ketorolac (ACULAR) 0.4 % SOLN     . losartan (COZAAR) 100 MG tablet Take 100 mg by mouth daily.    . Multiple Vitamin (MULTIVITAMIN) capsule Take 1 capsule by mouth daily.      . nitroGLYCERIN (NITROSTAT) 0.4 MG SL tablet Place 1 tablet (0.4 mg total) under the tongue every 5 (five) minutes as needed for chest pain. Reported on 12/07/2015 25 tablet 3  . NON FORMULARY CPAP Machine at night    . Omega-3 Fatty Acids (FISH OIL) 1200 MG CAPS Take 1 capsule by mouth daily.      . rivaroxaban (XARELTO) 20 MG TABS tablet Take 1 tablet (20 mg total) by mouth daily with supper. 90 tablet 3  . thiamine 100 MG tablet Take 100 mg by mouth daily.     No current facility-administered medications for this visit.     Allergies  Patient has no known allergies.  Electrocardiogram: 02/21/14   SR v pacing  03/20/15  AV pacing rate 72   Assessment and Plan Flutter:  Post ablation maint NSR continue NOAC Pacer:  Normal function continue Merlin transmission St Jude f/u Dr Caryl Comes CAD:  Stable with no angina and good activity level.  Continue medical Rx Chol:   Cholesterol is at goal.  HTN:  Well controlled.  Continue current medications and low  sodium Dash type diet.   Plasma Cell Dyscrasia:  Seen by Dr Julien Nordmann in March Smoldering since 2011 with no Rx And just mild anemia. Myeloma labs to be done September Lab Results  Component Value Date   WBC 6.2 05/31/2016   HGB 14.5 05/31/2016   HCT 41.0 05/31/2016   MCV 95.9 05/31/2016   PLT 200 05/31/2016   Varicose Veins:  Gave him referral to Patrecia Pour at [facility] and vein         Jenkins Rouge

## 2016-11-07 ENCOUNTER — Encounter: Payer: Self-pay | Admitting: Cardiovascular Disease

## 2016-11-07 ENCOUNTER — Encounter (INDEPENDENT_AMBULATORY_CARE_PROVIDER_SITE_OTHER): Payer: Self-pay

## 2016-11-07 ENCOUNTER — Ambulatory Visit (INDEPENDENT_AMBULATORY_CARE_PROVIDER_SITE_OTHER): Payer: Medicare HMO | Admitting: Cardiovascular Disease

## 2016-11-07 VITALS — BP 120/62 | HR 70 | Ht 72.0 in | Wt 212.0 lb

## 2016-11-07 DIAGNOSIS — I2583 Coronary atherosclerosis due to lipid rich plaque: Secondary | ICD-10-CM

## 2016-11-07 DIAGNOSIS — I2510 Atherosclerotic heart disease of native coronary artery without angina pectoris: Secondary | ICD-10-CM | POA: Diagnosis not present

## 2016-11-07 DIAGNOSIS — I83893 Varicose veins of bilateral lower extremities with other complications: Secondary | ICD-10-CM | POA: Diagnosis not present

## 2016-11-07 NOTE — Patient Instructions (Addendum)

## 2016-11-28 ENCOUNTER — Other Ambulatory Visit (HOSPITAL_BASED_OUTPATIENT_CLINIC_OR_DEPARTMENT_OTHER): Payer: Medicare HMO

## 2016-11-28 DIAGNOSIS — C9000 Multiple myeloma not having achieved remission: Secondary | ICD-10-CM | POA: Diagnosis not present

## 2016-11-28 LAB — COMPREHENSIVE METABOLIC PANEL
ALT: 28 U/L (ref 0–55)
AST: 33 U/L (ref 5–34)
Albumin: 3.5 g/dL (ref 3.5–5.0)
Alkaline Phosphatase: 75 U/L (ref 40–150)
Anion Gap: 10 mEq/L (ref 3–11)
BILIRUBIN TOTAL: 0.85 mg/dL (ref 0.20–1.20)
CO2: 27 mEq/L (ref 22–29)
Calcium: 9.4 mg/dL (ref 8.4–10.4)
Chloride: 99 mEq/L (ref 98–109)
Creatinine: 0.7 mg/dL (ref 0.7–1.3)
GLUCOSE: 93 mg/dL (ref 70–140)
POTASSIUM: 4.2 meq/L (ref 3.5–5.1)
SODIUM: 135 meq/L — AB (ref 136–145)
TOTAL PROTEIN: 8.5 g/dL — AB (ref 6.4–8.3)

## 2016-11-28 LAB — LACTATE DEHYDROGENASE: LDH: 169 U/L (ref 125–245)

## 2016-11-28 LAB — CBC WITH DIFFERENTIAL/PLATELET
BASO%: 0.5 % (ref 0.0–2.0)
Basophils Absolute: 0 10*3/uL (ref 0.0–0.1)
EOS ABS: 0.1 10*3/uL (ref 0.0–0.5)
EOS%: 1.1 % (ref 0.0–7.0)
HCT: 37.5 % — ABNORMAL LOW (ref 38.4–49.9)
HEMOGLOBIN: 13.2 g/dL (ref 13.0–17.1)
LYMPH%: 11.8 % — ABNORMAL LOW (ref 14.0–49.0)
MCH: 34.9 pg — ABNORMAL HIGH (ref 27.2–33.4)
MCHC: 35.3 g/dL (ref 32.0–36.0)
MCV: 99 fL — AB (ref 79.3–98.0)
MONO#: 1.1 10*3/uL — ABNORMAL HIGH (ref 0.1–0.9)
MONO%: 13 % (ref 0.0–14.0)
NEUT%: 73.6 % (ref 39.0–75.0)
NEUTROS ABS: 6.1 10*3/uL (ref 1.5–6.5)
Platelets: 207 10*3/uL (ref 140–400)
RBC: 3.79 10*6/uL — ABNORMAL LOW (ref 4.20–5.82)
RDW: 12.5 % (ref 11.0–14.6)
WBC: 8.2 10*3/uL (ref 4.0–10.3)
lymph#: 1 10*3/uL (ref 0.9–3.3)

## 2016-11-29 LAB — IGG, IGA, IGM
IGA/IMMUNOGLOBULIN A, SERUM: 102 mg/dL (ref 61–437)
IgG, Qn, Serum: 358 mg/dL — ABNORMAL LOW (ref 700–1600)
IgM, Qn, Serum: 4065 mg/dL — ABNORMAL HIGH (ref 15–143)

## 2016-11-29 LAB — BETA 2 MICROGLOBULIN, SERUM: Beta-2: 1.4 mg/L (ref 0.6–2.4)

## 2016-11-29 LAB — KAPPA/LAMBDA LIGHT CHAINS
IG KAPPA FREE LIGHT CHAIN: 11.2 mg/L (ref 3.3–19.4)
Ig Lambda Free Light Chain: 75.7 mg/L — ABNORMAL HIGH (ref 5.7–26.3)
Kappa/Lambda FluidC Ratio: 0.15 — ABNORMAL LOW (ref 0.26–1.65)

## 2016-12-01 ENCOUNTER — Ambulatory Visit (INDEPENDENT_AMBULATORY_CARE_PROVIDER_SITE_OTHER): Payer: Medicare HMO | Admitting: *Deleted

## 2016-12-01 DIAGNOSIS — I442 Atrioventricular block, complete: Secondary | ICD-10-CM

## 2016-12-01 NOTE — Progress Notes (Signed)
Remote pacemaker transmission.   

## 2016-12-02 ENCOUNTER — Encounter: Payer: Self-pay | Admitting: Cardiology

## 2016-12-05 ENCOUNTER — Encounter: Payer: Self-pay | Admitting: Internal Medicine

## 2016-12-05 ENCOUNTER — Ambulatory Visit (HOSPITAL_BASED_OUTPATIENT_CLINIC_OR_DEPARTMENT_OTHER): Payer: Medicare HMO | Admitting: Internal Medicine

## 2016-12-05 ENCOUNTER — Telehealth: Payer: Self-pay | Admitting: Internal Medicine

## 2016-12-05 VITALS — BP 135/54 | HR 89 | Temp 98.6°F | Resp 18 | Ht 72.0 in | Wt 216.3 lb

## 2016-12-05 DIAGNOSIS — C9000 Multiple myeloma not having achieved remission: Secondary | ICD-10-CM

## 2016-12-05 DIAGNOSIS — Z8579 Personal history of other malignant neoplasms of lymphoid, hematopoietic and related tissues: Secondary | ICD-10-CM | POA: Diagnosis not present

## 2016-12-05 LAB — CUP PACEART REMOTE DEVICE CHECK
Battery Remaining Longevity: 54 mo
Battery Voltage: 2.87 V
Brady Statistic AS VP Percent: 26 %
Brady Statistic RA Percent Paced: 73 %
Implantable Lead Location: 753859
Implantable Lead Location: 753860
Lead Channel Impedance Value: 410 Ohm
Lead Channel Pacing Threshold Amplitude: 0.5 V
Lead Channel Pacing Threshold Pulse Width: 0.5 ms
Lead Channel Sensing Intrinsic Amplitude: 2.6 mV
MDC IDC LEAD IMPLANT DT: 20111021
MDC IDC LEAD IMPLANT DT: 20111021
MDC IDC MSMT BATTERY REMAINING PERCENTAGE: 51 %
MDC IDC MSMT LEADCHNL RV IMPEDANCE VALUE: 440 Ohm
MDC IDC MSMT LEADCHNL RV PACING THRESHOLD AMPLITUDE: 1 V
MDC IDC MSMT LEADCHNL RV PACING THRESHOLD PULSEWIDTH: 0.5 ms
MDC IDC MSMT LEADCHNL RV SENSING INTR AMPL: 12 mV
MDC IDC PG IMPLANT DT: 20111021
MDC IDC PG SERIAL: 7163077
MDC IDC SESS DTM: 20180322122755
MDC IDC SET LEADCHNL RA PACING AMPLITUDE: 1.5 V
MDC IDC SET LEADCHNL RV PACING AMPLITUDE: 2.5 V
MDC IDC SET LEADCHNL RV PACING PULSEWIDTH: 0.5 ms
MDC IDC SET LEADCHNL RV SENSING SENSITIVITY: 2 mV
MDC IDC STAT BRADY AP VP PERCENT: 74 %
MDC IDC STAT BRADY AP VS PERCENT: 1 %
MDC IDC STAT BRADY AS VS PERCENT: 1 %
MDC IDC STAT BRADY RV PERCENT PACED: 99 %

## 2016-12-05 NOTE — Progress Notes (Signed)
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   INTERVAL HISTORY: [name] 76 y.o. male returns to the clinic today for follow-up visit. The patient is feeling fine with no specific complaints. He denied having any chest pain, shortness of breath, cough or hemoptysis. He has no fever or chills. He has no nausea or vomiting. He was started recently on Xarelto for atrial fibrillation. He denied having any recent weight loss or night sweats. He had repeat myeloma panel performed recently and he is here for evaluation and discussion of his lab results.  MEDICAL HISTORY: Past Medical History:  Diagnosis Date  . Atrial flutter ([place])    A. s/p prior ablation;  B.  s/p CTI ablation 10/24/10 (Dr. Caryl Comes)  . CAD (coronary artery disease)    A.  s/p CFX in the past;   B.  cath 06/2010: LAD 30-40%, D1 50%, OM1 50%, AVCFX stent 30%, dCFX 70-80% (med Rx), mRCA 40%;      C.  Echo 06/2010: EF 60-65%, mod LVH; mild LAE     . Cubital tunnel syndrome   . Degenerative disc disease   . Heart block   . Hx of adenomatous colonic polyps   . Hyperlipidemia   . Hypertension   . Multiple myeloma   . Sleep apnea   . 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 Venous insufficiency     ALLERGIES:  has No Known Allergies.  MEDICATIONS:  Current Outpatient Prescriptions  Medication Sig Dispense Refill  . atenolol (TENORMIN) 50 MG tablet TAKE ONE TABLET BY MOUTH ONCE DAILY 90 tablet 3  . atorvastatin (LIPITOR) 10 MG tablet Take 10 mg by mouth daily.    . Glucosamine HCl 1000 MG TABS Take 2 tablets by mouth daily.    Marland Kitchen ketorolac (ACULAR) 0.4 % SOLN     . losartan (COZAAR) 100 MG tablet Take 100 mg by mouth daily.    . Multiple Vitamin  (MULTIVITAMIN) capsule Take 1 capsule by mouth daily.      . nitroGLYCERIN (NITROSTAT) 0.4 MG SL tablet Place 1 tablet (0.4 mg total) under the tongue every 5 (five) minutes as needed for chest pain. Reported on 12/07/2015 25 tablet 3  . NON FORMULARY CPAP Machine at night    . Omega-3 Fatty Acids (FISH OIL) 1200 MG CAPS Take 1 capsule by mouth daily.      . rivaroxaban (XARELTO) 20 MG TABS tablet Take 1 tablet (20 mg total) by mouth daily with supper. 90 tablet 3  . thiamine 100 MG tablet Take 100 mg by mouth daily.     No current facility-administered medications for this visit.     SURGICAL HISTORY:  Past Surgical History:  Procedure Laterality Date  . COLONOSCOPY  10-07-2009   TA polyp, tics, hems   . CORONARY ANGIOPLASTY WITH STENT PLACEMENT  2005  . PACEMAKER PLACEMENT  2011  . POLYPECTOMY    . Right olecranon nerve surgery    . VENTRAL HERNIA REPAIR      REVIEW OF SYSTEMS:  A comprehensive review of systems was negative except for: Constitutional: positive for fatigue Musculoskeletal: positive for arthralgias   PHYSICAL EXAMINATION: General appearance: alert, cooperative, fatigued and no distress Head: Normocephalic, without obvious abnormality, atraumatic  Neck: no adenopathy, no JVD, supple, symmetrical, trachea midline and thyroid not enlarged, symmetric, no tenderness/mass/nodules Lymph nodes: Cervical, supraclavicular, and axillary nodes normal. Resp: clear to auscultation bilaterally Back: symmetric, no curvature. ROM normal. No CVA tenderness. Cardio: regular rate and rhythm, S1, S2 normal, no murmur, click, rub or gallop GI: soft, non-tender; bowel sounds normal; no masses,  no organomegaly Extremities: extremities normal, atraumatic, no cyanosis or edema  ECOG PERFORMANCE STATUS: 1 - Symptomatic but completely ambulatory  Blood pressure (!) 135/54, pulse 89, temperature 98.6 F (37 C), temperature source Oral, resp. rate 18, height 6' (1.829 m), weight 216 lb 4.8 oz  (98.1 kg), SpO2 99 %.  LABORATORY DATA: Lab Results  Component Value Date   WBC 8.2 11/28/2016   HGB 13.2 11/28/2016   HCT 37.5 (L) 11/28/2016   MCV 99.0 (H) 11/28/2016   PLT 207 11/28/2016      Chemistry      Component Value Date/Time   NA 135 (L) 11/28/2016 0907   K 4.2 11/28/2016 0907   CL 97 04/29/2013 0900   CL 97 (L) 12/03/2012 1015   CO2 27 11/28/2016 0907   BUN <4.0 (L) 11/28/2016 0907   CREATININE 0.7 11/28/2016 0907      Component Value Date/Time   CALCIUM 9.4 11/28/2016 0907   ALKPHOS 75 11/28/2016 0907   AST 33 11/28/2016 0907   ALT 28 11/28/2016 0907   BILITOT 0.85 11/28/2016 0907       RADIOGRAPHIC STUDIES: No results found.  ASSESSMENT AND PLAN:  This is a very pleasant 76 years old white male with history of smoldering multiple myeloma diagnosed in August 2011 and has been observation since that time. The patient is doing well today and decreased myeloma panel showed no clear evidence for disease progression except for mild increase of the free lambda light chain as well as the IgM. I recommended for the patient to continue on observation for now. I would see him back for follow-up visit in 6 months with repeat myeloma panel. He was advised to call immediately if he has any concerning symptoms in the interval. The patient voices understanding of current disease status and treatment options and is in agreement with the current care plan.  All questions were answered. The patient knows to call the clinic with any problems, questions or concerns. We can certainly see the patient much sooner if necessary. I spent 10 minutes counseling the patient face to face. The total time spent in the appointment was 15 minutes.  Disclaimer: This note was dictated with voice recognition software. Similar sounding words can inadvertently be transcribed and may not be corrected upon review.

## 2016-12-05 NOTE — Telephone Encounter (Signed)
Gave patient avs report and appointments for September.  °

## 2017-03-02 ENCOUNTER — Ambulatory Visit (INDEPENDENT_AMBULATORY_CARE_PROVIDER_SITE_OTHER): Payer: Medicare HMO | Admitting: *Deleted

## 2017-03-02 ENCOUNTER — Telehealth: Payer: Self-pay | Admitting: Cardiology

## 2017-03-02 DIAGNOSIS — I442 Atrioventricular block, complete: Secondary | ICD-10-CM

## 2017-03-02 NOTE — Telephone Encounter (Signed)
LMOVM reminding pt to send remote transmission.   

## 2017-03-03 ENCOUNTER — Encounter: Payer: Self-pay | Admitting: Cardiology

## 2017-03-03 NOTE — Progress Notes (Signed)
Remote pacemaker transmission.   

## 2017-03-06 DIAGNOSIS — H10503 Unspecified blepharoconjunctivitis, bilateral: Secondary | ICD-10-CM | POA: Diagnosis not present

## 2017-03-06 DIAGNOSIS — L905 Scar conditions and fibrosis of skin: Secondary | ICD-10-CM | POA: Diagnosis not present

## 2017-03-06 DIAGNOSIS — D1801 Hemangioma of skin and subcutaneous tissue: Secondary | ICD-10-CM | POA: Diagnosis not present

## 2017-03-06 DIAGNOSIS — Z85828 Personal history of other malignant neoplasm of skin: Secondary | ICD-10-CM | POA: Diagnosis not present

## 2017-03-06 DIAGNOSIS — L720 Epidermal cyst: Secondary | ICD-10-CM | POA: Diagnosis not present

## 2017-03-06 DIAGNOSIS — D485 Neoplasm of uncertain behavior of skin: Secondary | ICD-10-CM | POA: Diagnosis not present

## 2017-03-06 DIAGNOSIS — L570 Actinic keratosis: Secondary | ICD-10-CM | POA: Diagnosis not present

## 2017-03-06 DIAGNOSIS — L814 Other melanin hyperpigmentation: Secondary | ICD-10-CM | POA: Diagnosis not present

## 2017-03-06 DIAGNOSIS — D225 Melanocytic nevi of trunk: Secondary | ICD-10-CM | POA: Diagnosis not present

## 2017-03-06 DIAGNOSIS — D692 Other nonthrombocytopenic purpura: Secondary | ICD-10-CM | POA: Diagnosis not present

## 2017-03-06 DIAGNOSIS — L821 Other seborrheic keratosis: Secondary | ICD-10-CM | POA: Diagnosis not present

## 2017-03-15 LAB — CUP PACEART REMOTE DEVICE CHECK
Battery Voltage: 2.86 V
Brady Statistic AP VP Percent: 72 %
Brady Statistic RA Percent Paced: 65 %
Brady Statistic RV Percent Paced: 99 %
Date Time Interrogation Session: 20180621195039
Implantable Lead Implant Date: 20111021
Implantable Lead Location: 753860
Implantable Pulse Generator Implant Date: 20111021
Lead Channel Impedance Value: 390 Ohm
Lead Channel Sensing Intrinsic Amplitude: 1.6 mV
Lead Channel Setting Pacing Amplitude: 1.625
Lead Channel Setting Pacing Amplitude: 2.5 V
Lead Channel Setting Pacing Pulse Width: 0.5 ms
Lead Channel Setting Sensing Sensitivity: 2 mV
MDC IDC LEAD IMPLANT DT: 20111021
MDC IDC LEAD LOCATION: 753859
MDC IDC MSMT BATTERY REMAINING LONGEVITY: 47 mo
MDC IDC MSMT BATTERY REMAINING PERCENTAGE: 46 %
MDC IDC MSMT LEADCHNL RA PACING THRESHOLD AMPLITUDE: 0.625 V
MDC IDC MSMT LEADCHNL RA PACING THRESHOLD PULSEWIDTH: 0.5 ms
MDC IDC MSMT LEADCHNL RV IMPEDANCE VALUE: 440 Ohm
MDC IDC MSMT LEADCHNL RV PACING THRESHOLD AMPLITUDE: 1 V
MDC IDC MSMT LEADCHNL RV PACING THRESHOLD PULSEWIDTH: 0.5 ms
MDC IDC MSMT LEADCHNL RV SENSING INTR AMPL: 12 mV
MDC IDC STAT BRADY AP VS PERCENT: 1 %
MDC IDC STAT BRADY AS VP PERCENT: 28 %
MDC IDC STAT BRADY AS VS PERCENT: 1 %
Pulse Gen Model: 2110
Pulse Gen Serial Number: 7163077

## 2017-03-25 DIAGNOSIS — L0100 Impetigo, unspecified: Secondary | ICD-10-CM | POA: Diagnosis not present

## 2017-04-14 DIAGNOSIS — R69 Illness, unspecified: Secondary | ICD-10-CM | POA: Diagnosis not present

## 2017-04-14 DIAGNOSIS — Z85828 Personal history of other malignant neoplasm of skin: Secondary | ICD-10-CM | POA: Diagnosis not present

## 2017-04-14 DIAGNOSIS — C9000 Multiple myeloma not having achieved remission: Secondary | ICD-10-CM | POA: Diagnosis not present

## 2017-04-14 DIAGNOSIS — Z6829 Body mass index (BMI) 29.0-29.9, adult: Secondary | ICD-10-CM | POA: Diagnosis not present

## 2017-04-14 DIAGNOSIS — I25118 Atherosclerotic heart disease of native coronary artery with other forms of angina pectoris: Secondary | ICD-10-CM | POA: Diagnosis not present

## 2017-04-14 DIAGNOSIS — L578 Other skin changes due to chronic exposure to nonionizing radiation: Secondary | ICD-10-CM | POA: Diagnosis not present

## 2017-04-14 DIAGNOSIS — R7301 Impaired fasting glucose: Secondary | ICD-10-CM | POA: Diagnosis not present

## 2017-04-14 DIAGNOSIS — I10 Essential (primary) hypertension: Secondary | ICD-10-CM | POA: Diagnosis not present

## 2017-05-19 ENCOUNTER — Emergency Department (HOSPITAL_COMMUNITY): Payer: Medicare HMO

## 2017-05-19 ENCOUNTER — Other Ambulatory Visit: Payer: Self-pay | Admitting: *Deleted

## 2017-05-19 ENCOUNTER — Encounter (HOSPITAL_COMMUNITY): Payer: Self-pay

## 2017-05-19 ENCOUNTER — Inpatient Hospital Stay (HOSPITAL_COMMUNITY)
Admission: EM | Admit: 2017-05-19 | Discharge: 2017-05-22 | DRG: 200 | Disposition: A | Discharge status: Home Health | Payer: Medicare HMO | Attending: Internal Medicine | Admitting: Internal Medicine

## 2017-05-19 DIAGNOSIS — I2510 Atherosclerotic heart disease of native coronary artery without angina pectoris: Secondary | ICD-10-CM | POA: Diagnosis not present

## 2017-05-19 DIAGNOSIS — J939 Pneumothorax, unspecified: Secondary | ICD-10-CM

## 2017-05-19 DIAGNOSIS — Z7289 Other problems related to lifestyle: Secondary | ICD-10-CM | POA: Diagnosis not present

## 2017-05-19 DIAGNOSIS — Z950 Presence of cardiac pacemaker: Secondary | ICD-10-CM | POA: Diagnosis present

## 2017-05-19 DIAGNOSIS — Z955 Presence of coronary angioplasty implant and graft: Secondary | ICD-10-CM

## 2017-05-19 DIAGNOSIS — S270XXA Traumatic pneumothorax, initial encounter: Secondary | ICD-10-CM | POA: Diagnosis not present

## 2017-05-19 DIAGNOSIS — Z4682 Encounter for fitting and adjustment of non-vascular catheter: Secondary | ICD-10-CM

## 2017-05-19 DIAGNOSIS — R079 Chest pain, unspecified: Secondary | ICD-10-CM | POA: Diagnosis not present

## 2017-05-19 DIAGNOSIS — R0602 Shortness of breath: Secondary | ICD-10-CM | POA: Diagnosis not present

## 2017-05-19 DIAGNOSIS — J9311 Primary spontaneous pneumothorax: Secondary | ICD-10-CM | POA: Diagnosis not present

## 2017-05-19 DIAGNOSIS — Z79899 Other long term (current) drug therapy: Secondary | ICD-10-CM | POA: Diagnosis not present

## 2017-05-19 DIAGNOSIS — W28XXXA Contact with powered lawn mower, initial encounter: Secondary | ICD-10-CM

## 2017-05-19 DIAGNOSIS — Z87891 Personal history of nicotine dependence: Secondary | ICD-10-CM

## 2017-05-19 DIAGNOSIS — C9000 Multiple myeloma not having achieved remission: Secondary | ICD-10-CM | POA: Diagnosis present

## 2017-05-19 DIAGNOSIS — Z91030 Bee allergy status: Secondary | ICD-10-CM | POA: Diagnosis not present

## 2017-05-19 DIAGNOSIS — E785 Hyperlipidemia, unspecified: Secondary | ICD-10-CM | POA: Diagnosis present

## 2017-05-19 DIAGNOSIS — Z8249 Family history of ischemic heart disease and other diseases of the circulatory system: Secondary | ICD-10-CM

## 2017-05-19 DIAGNOSIS — Z7901 Long term (current) use of anticoagulants: Secondary | ICD-10-CM

## 2017-05-19 DIAGNOSIS — I10 Essential (primary) hypertension: Secondary | ICD-10-CM | POA: Diagnosis not present

## 2017-05-19 DIAGNOSIS — G4733 Obstructive sleep apnea (adult) (pediatric): Secondary | ICD-10-CM | POA: Diagnosis present

## 2017-05-19 DIAGNOSIS — I2583 Coronary atherosclerosis due to lipid rich plaque: Secondary | ICD-10-CM | POA: Diagnosis not present

## 2017-05-19 DIAGNOSIS — I4891 Unspecified atrial fibrillation: Secondary | ICD-10-CM | POA: Diagnosis present

## 2017-05-19 DIAGNOSIS — K5900 Constipation, unspecified: Secondary | ICD-10-CM | POA: Diagnosis not present

## 2017-05-19 LAB — BASIC METABOLIC PANEL
Anion gap: 13 (ref 5–15)
BUN: 6 mg/dL (ref 6–20)
CALCIUM: 9.4 mg/dL (ref 8.9–10.3)
CO2: 22 mmol/L (ref 22–32)
Chloride: 101 mmol/L (ref 101–111)
Creatinine, Ser: 0.94 mg/dL (ref 0.61–1.24)
GFR calc Af Amer: >60 mL/min (ref >60)
GLUCOSE: 91 mg/dL (ref 65–99)
Potassium: 3.9 mmol/L (ref 3.5–5.1)
Sodium: 136 mmol/L (ref 135–145)

## 2017-05-19 LAB — CBC
HEMATOCRIT: 35.5 % — AB (ref 39.0–52.0)
HEMOGLOBIN: 12.1 g/dL — AB (ref 13.0–17.0)
MCH: 34.3 pg — AB (ref 26.0–34.0)
MCHC: 34.1 g/dL (ref 30.0–36.0)
MCV: 100.6 fL — ABNORMAL HIGH (ref 78.0–100.0)
Platelets: 215 10*3/uL (ref 150–400)
RBC: 3.53 MIL/uL — ABNORMAL LOW (ref 4.22–5.81)
RDW: 12.5 % (ref 11.5–15.5)
WBC: 8 10*3/uL (ref 4.0–10.5)

## 2017-05-19 LAB — I-STAT TROPONIN, ED: Troponin i, poc: 0 ng/mL (ref 0.00–0.08)

## 2017-05-19 MED ORDER — ATORVASTATIN CALCIUM 10 MG PO TABS
10.0000 mg | ORAL_TABLET | Freq: Every day | ORAL | Status: DC
  Administered 2017-05-20 – 2017-05-22 (×3): 10 mg via ORAL
  Filled 2017-05-19 (×3): qty 1

## 2017-05-19 MED ORDER — SODIUM CHLORIDE 0.9% FLUSH
3.0000 mL | Freq: Two times a day (BID) | INTRAVENOUS | Status: DC
  Administered 2017-05-19 – 2017-05-21 (×5): 3 mL via INTRAVENOUS

## 2017-05-19 MED ORDER — POLYETHYLENE GLYCOL 3350 17 G PO PACK: 17.0000 g | PACK | Freq: Every day | ORAL | Status: DC | PRN

## 2017-05-19 MED ORDER — ADULT MULTIVITAMIN W/MINERALS CH
1.0000 | ORAL_TABLET | Freq: Every day | ORAL | Status: DC
  Administered 2017-05-20 – 2017-05-22 (×3): 1 via ORAL
  Filled 2017-05-19 (×3): qty 1

## 2017-05-19 MED ORDER — KETOROLAC TROMETHAMINE 0.5 % OP SOLN
1.0000 [drp] | Freq: Four times a day (QID) | OPHTHALMIC | Status: DC
  Administered 2017-05-19 – 2017-05-22 (×10): 1 [drp] via OPHTHALMIC
  Filled 2017-05-19: qty 3

## 2017-05-19 MED ORDER — OMEGA-3-ACID ETHYL ESTERS 1 G PO CAPS
2.0000 g | ORAL_CAPSULE | Freq: Every day | ORAL | Status: DC
  Administered 2017-05-20 – 2017-05-22 (×3): 2 g via ORAL
  Filled 2017-05-19 (×3): qty 2

## 2017-05-19 MED ORDER — FENTANYL CITRATE (PF) 100 MCG/2ML IJ SOLN
INTRAMUSCULAR | Status: AC
  Filled 2017-05-19: qty 2

## 2017-05-19 MED ORDER — LOSARTAN POTASSIUM 50 MG PO TABS
100.0000 mg | ORAL_TABLET | Freq: Every day | ORAL | Status: DC
  Administered 2017-05-20 – 2017-05-22 (×3): 100 mg via ORAL
  Filled 2017-05-19 (×3): qty 2

## 2017-05-19 MED ORDER — ENOXAPARIN SODIUM 40 MG/0.4ML ~~LOC~~ SOLN: 40.0000 mg | SUBCUTANEOUS | Status: DC

## 2017-05-19 MED ORDER — VITAMIN B-1 100 MG PO TABS
100.0000 mg | ORAL_TABLET | Freq: Every day | ORAL | Status: DC
  Administered 2017-05-20 – 2017-05-22 (×3): 100 mg via ORAL
  Filled 2017-05-19 (×3): qty 1

## 2017-05-19 MED ORDER — MORPHINE SULFATE (PF) 2 MG/ML IV SOLN: 2.0000 mg | INTRAVENOUS | Status: DC | PRN

## 2017-05-19 MED ORDER — OXYCODONE HCL 5 MG PO TABS
5.0000 mg | ORAL_TABLET | ORAL | Status: DC | PRN
  Administered 2017-05-19 – 2017-05-20 (×2): 5 mg via ORAL
  Filled 2017-05-19 (×2): qty 1

## 2017-05-19 MED ORDER — MULTIVITAMINS PO CAPS: 1.0000 | ORAL_CAPSULE | Freq: Every day | ORAL | Status: DC

## 2017-05-19 MED ORDER — FENTANYL CITRATE (PF) 100 MCG/2ML IJ SOLN
50.0000 ug | Freq: Once | INTRAMUSCULAR | Status: AC
  Administered 2017-05-19: 50 ug via INTRAVENOUS
  Filled 2017-05-19 (×2): qty 2

## 2017-05-19 MED ORDER — LIDOCAINE-EPINEPHRINE (PF) 2 %-1:200000 IJ SOLN
20.0000 mL | Freq: Once | INTRAMUSCULAR | Status: AC
  Administered 2017-05-19: 20 mL via INTRADERMAL
  Filled 2017-05-19 (×2): qty 20

## 2017-05-19 MED ORDER — ATENOLOL 25 MG PO TABS
50.0000 mg | ORAL_TABLET | Freq: Every day | ORAL | Status: DC
Start: 2017-05-20 — End: 2017-05-22
  Administered 2017-05-20 – 2017-05-22 (×3): 50 mg via ORAL
  Filled 2017-05-19 (×3): qty 2

## 2017-05-19 MED ORDER — GLUCOSAMINE HCL 1000 MG PO TABS: 2.0000 | ORAL_TABLET | Freq: Every day | ORAL | Status: DC

## 2017-05-19 MED ORDER — FENTANYL CITRATE (PF) 100 MCG/2ML IJ SOLN
50.0000 ug | INTRAMUSCULAR | Status: AC | PRN
  Administered 2017-05-19 (×2): 50 ug via INTRAVENOUS
  Filled 2017-05-19: qty 2

## 2017-05-19 NOTE — ED Provider Notes (Signed)
[facility] DEPT Provider Note   CSN: [id number] Arrival date & time: 05/19/17  1316     History   Chief Complaint Chief Complaint  Patient presents with  . Chest Pain  . Shortness of Breath    HPI [name] is a 76 y.o. male.  HPI  76 year old male with a history of CAD, atrial flutter, heart block status post pacemaker, multiple myeloma, who is on Xarelto presents with acute right-sided chest pain or short of breath. He states that 2 days ago his lawnmower tilted over and landed on him, pinning him on the ground. He has felt achy in his right chest but no severe pain. He's broken ribs before and states that this does not feel nearly as bad. However at 3 AM he thinks the chest pain short of breath woke him up from sleep. It is not severe but uncomfortable.  Past Medical History:  Diagnosis Date  . Atrial flutter ([place])    A. s/p prior ablation;  B.  s/p CTI ablation 10/24/10 (Dr. Caryl Comes)  . CAD (coronary artery disease)    A.  s/p CFX in the past;   B.  cath 06/2010: LAD 30-40%, D1 50%, OM1 50%, AVCFX stent 30%, dCFX 70-80% (med Rx), mRCA 40%;      C.  Echo 06/2010: EF 60-65%, mod LVH; mild LAE     . Cubital tunnel syndrome   . Degenerative disc disease   . Heart block   . Hx of adenomatous colonic polyps   . Hyperlipidemia   . Hypertension   . Multiple myeloma   . Sleep apnea   . 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 Venous insufficiency     Patient Active Problem List   Diagnosis Date Noted  . Alcohol intoxication in active alcoholic ([place]) 85/10/7739  . Right-sided muscle weakness 04/28/2013  . 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 Coronary artery disease-nonobstructive catheter in October 2011 12/27/2010  . PACEMAKER, St. Jude dual-chamber 09/14/2010  . Multiple myeloma ([place]) 07/21/2010  . SMOKER 07/21/2010  . Obstructive sleep apnea 06/12/2009  . Atrioventricular block, heart high-grade 01/29/2009    . SICK SINUS/ TACHY-BRADY SYNDROME 01/29/2009  . Atrial tachycardia/fibrillation 01/14/2009  . SCIATICA 01/14/2009  . Essential hypertension 01/03/2009  . VENOUS INSUFFICIENCY 01/03/2009  . ALLERGY 01/03/2009    Past Surgical History:  Procedure Laterality Date  . COLONOSCOPY  10-07-2009   TA polyp, tics, hems   . CORONARY ANGIOPLASTY WITH STENT PLACEMENT  2005  . PACEMAKER PLACEMENT  2011  . POLYPECTOMY    . Right olecranon nerve surgery    . VENTRAL HERNIA REPAIR         Home Medications    Prior to Admission medications   Medication Sig Start Date End Date Taking? Authorizing Provider  atenolol (TENORMIN) 50 MG tablet TAKE ONE TABLET BY MOUTH ONCE DAILY 06/01/16   Josue Hector, MD  atorvastatin (LIPITOR) 10 MG tablet Take 10 mg by mouth daily.    [provider]  EPINEPHrine 0.3 mg/0.3 mL IJ SOAJ injection Inject 0.3 mg into the muscle once as needed for anaphylaxis. 04/25/17   [provider]  fluorouracil (EFUDEX) 5 % cream  03/09/17   [provider]  Glucosamine HCl 1000 MG TABS Take 2 tablets by mouth daily.    [provider]  ketorolac (ACULAR) 0.4 % SOLN  09/09/15   [provider]  losartan (COZAAR) 100 MG  tablet Take 100 mg by mouth daily.    [provider]  Multiple Vitamin (MULTIVITAMIN) capsule Take 1 capsule by mouth daily.      [provider]  mupirocin ointment (BACTROBAN) 2 %  03/25/17   [provider]  nitroGLYCERIN (NITROSTAT) 0.4 MG SL tablet Place 1 tablet (0.4 mg total) under the tongue every 5 (five) minutes as needed for chest pain. Reported on 12/07/2015 04/15/16   Josue Hector, MD  NON FORMULARY CPAP Machine at night    [provider]  Omega-3 Fatty Acids (FISH OIL) 1200 MG CAPS Take 1 capsule by mouth daily.      [provider]  rivaroxaban (XARELTO) 20 MG TABS tablet Take 1 tablet (20 mg total) by mouth daily with supper. 09/21/16   Deboraha Sprang, MD   thiamine 100 MG tablet Take 100 mg by mouth daily.    [provider]    Family History Family History  Problem Relation Age of Onset  . Heart disease Father   . Cancer Father        prostate  . Heart disease Brother   . Colon cancer Neg Hx     Social History Social History  Substance Use Topics  . Smoking status: Former Smoker    Packs/day: 1.00    Years: 49.00    Types: Cigarettes    Quit date: 09/12/2004  . Smokeless tobacco: Former Systems developer     Comment: started at age 19; smoked 1 ppd; quit in 2006  . Alcohol use 16.8 oz/week    28 Standard drinks or equivalent per week     Comment: occasional     Allergies   Patient has no known allergies.   Review of Systems Review of Systems  Constitutional: Negative for fever.  Respiratory: Positive for shortness of breath.   Cardiovascular: Positive for chest pain.  All other systems reviewed and are negative.    Physical Exam Updated Vital Signs BP (!) 154/73   Pulse 66   Temp 98.5 F (36.9 C) (Oral)   Resp 14   Ht _0  (1.727 m)   Wt 98 kg (216 lb)   SpO2 100%   BMI 32.84 kg/m   Physical Exam  Constitutional: He is oriented to person, place, and time. He appears well-developed and well-nourished. No distress.  HENT:  Head: Normocephalic and atraumatic.  Right Ear: External ear normal.  Left Ear: External ear normal.  Nose: Nose normal.  Eyes: Right eye exhibits no discharge. Left eye exhibits no discharge.  Neck: Neck supple.  Cardiovascular: Normal rate, regular rhythm and normal heart sounds.   Pulmonary/Chest: Effort normal. No accessory muscle usage. Tachypnea noted. No respiratory distress. He has decreased breath sounds in the right upper field, the right middle field and the right lower field. He exhibits tenderness. He exhibits no crepitus.    Abdominal: Soft. There is no tenderness.  Musculoskeletal: He exhibits no edema.  Neurological: He is alert and oriented to person, place, and time.   Skin: Skin is warm and dry. He is not diaphoretic.  Nursing note and vitals reviewed.    ED Treatments / Results  Labs (all labs ordered are listed, but only abnormal results are displayed) Labs Reviewed  CBC - Abnormal; Notable for the following:       Result Value   RBC 3.53 (*)    Hemoglobin 12.1 (*)    HCT 35.5 (*)    MCV 100.6 (*)  MCH 34.3 (*)    All other components within normal limits  BASIC METABOLIC PANEL  I-STAT TROPONIN, ED    EKG  EKG Interpretation  Date/Time:  Friday May 19 2017 13:20:32 EDT Ventricular Rate:  78 PR Interval:    QRS Duration: 114 QT Interval:  412 QTC Calculation: 469 R Axis:   61 Text Interpretation:  Ventricular-paced rhythm Abnormal ECG no significant change compared to 2015 Confirmed by Sherwood Gambler [id number]) on 05/19/2017 2:18:50 PM       Radiology Dg Chest 2 View  Result Date: 05/19/2017 CLINICAL DATA:  Chest pain and shortness of breath after lawnmower accident 2 days ago. EXAM: CHEST  2 VIEW COMPARISON:  Radiographs of December 03, 2014. FINDINGS: The heart size and mediastinal contours are within normal limits. Left-sided pacemaker is in good position. Atherosclerosis of thoracic aorta is noted. Left lung is clear. Large right pneumothorax is noted. Bony thorax is unremarkable. IMPRESSION: Large right pneumothorax is noted. Critical Value/emergent results were called by telephone at the time of interpretation on 05/19/2017 at 2:12 pm to Westley Chandler, the patient's nurse, who verbally acknowledged these results and will immediately contact the physician. Electronically Signed   By: Marijo Conception, M.D.   On: 05/19/2017 14:13   Dg Chest Portable 1 View  Result Date: 05/19/2017 CLINICAL DATA:  76 year old male with a history of chest tube placement after pneumothorax EXAM: PORTABLE CHEST 1 VIEW COMPARISON:  05/19/2017 FINDINGS: Cardiomediastinal silhouette unchanged in size and contour. Calcifications of the aortic arch. Unchanged  cardiac pacing device on the left chest wall with 2 leads in place. Interval placement of right-sided pigtail thoracostomy tube which terminates at the medial apex of the right lung. There has been re-expansion of the right lung with no visualized pneumothorax on the current study. Low lung volumes with coarsened interstitial markings. No displaced fracture IMPRESSION: Interval placement of right-sided pigtail thoracostomy tube, terminating at the right apex, with resolution of right pneumothorax. Electronically Signed   By: Corrie Mckusick D.O.   On: 05/19/2017 15:59    Procedures CHEST TUBE INSERTION Date/Time: 05/19/2017 3:44 PM Performed by: Sherwood Gambler Authorized by: Sherwood Gambler   Consent:    Consent obtained:  Written and verbal   Consent given by:  Patient   Risks discussed:  Bleeding, damage to surrounding structures, infection, incomplete drainage, nerve damage and pain Pre-procedure details:    Skin preparation:  Betadine Anesthesia (see MAR for exact dosages):    Anesthesia method:  Local infiltration   Local anesthetic:  Lidocaine 2% WITH epi Procedure details:    Placement location:  R lateral   Scalpel size:  11   Tube size (French): pigtail.   Dissection instrument: dilator.   Ultrasound guidance: no     Tension pneumothorax: no     Tube connected to:  Heimlich valve and suction   Drainage characteristics:  Air only   Suture material:  2-0 silk   Dressing:  4x4 sterile gauze and petrolatum-impregnated gauze Post-procedure details:    Post-insertion x-ray findings: tube in good position     Patient tolerance of procedure:  Tolerated well, no immediate complications   (including critical care time)  CRITICAL CARE Performed by: Sherwood Gambler T   Total critical care time: 35 minutes  Critical care time was exclusive of separately billable procedures and treating other patients.  Critical care was necessary to treat or prevent imminent or life-threatening  deterioration.  Critical care was time spent personally by me on the  following activities: development of treatment plan with patient and/or surrogate as well as nursing, discussions with consultants, evaluation of patient's response to treatment, examination of patient, obtaining history from patient or surrogate, ordering and performing treatments and interventions, ordering and review of laboratory studies, ordering and review of radiographic studies, pulse oximetry and re-evaluation of patient's condition.   Medications Ordered in ED Medications  fentaNYL (SUBLIMAZE) injection 50 mcg (50 mcg Intravenous Given 05/19/17 1522)  fentaNYL (SUBLIMAZE) 100 MCG/2ML injection (not administered)  lidocaine-EPINEPHrine (XYLOCAINE W/EPI) 2 %-1:200000 (PF) injection 20 mL (20 mLs Intradermal Given by Other 05/19/17 1454)  fentaNYL (SUBLIMAZE) injection 50 mcg (50 mcg Intravenous Given 05/19/17 1450)     Initial Impression / Assessment and Plan / ED Course  I have reviewed the triage vital signs and the nursing notes.  Pertinent labs & imaging results that were available during my care of the patient were reviewed by me and considered in my medical decision making (see chart for details).     Chest x-ray shows right-sided pneumothorax. No obvious rib fractures. Odd that it occurred on same side as chest contusion but 2 days later. He is stable with adequate oxygenation, no respiratory distress, and no hypotension. No signs of tension. Discussed with CT surgery, Dr. Roxan Hockey, who is in the OR and will be there for several hours. Thus chest tube placed by myself as above. Given comorbidities, CT surgery asks for hospitalist admit.  Final Clinical Impressions(s) / ED Diagnoses   Final diagnoses:  Pneumothorax on right    New Prescriptions New Prescriptions   No medications on file     Sherwood Gambler, MD 05/19/17 1659

## 2017-05-19 NOTE — Consult Note (Signed)
Reason for Consult:Right pneumothorax Referring Physician: ED  [name] is an 76 y.o. male.  HPI: 76 yo man with a past history of CAD, atrial flutter, heart block, hyperlipidemia, hypertension, multiple myeloma and OSA on CPAP.  He was riding his lawn mower 2 days ago when he rolled it over in a ditch. Does not recall how he landed and did not seek medical attention.Marland Kitchen He felt stiff the next day. This morning he woke up feeling short of breath and having right sided CP. Came to ED. CXR showed a right pneumothorax. The ED physician placed a right CT.   Currently breathing better, still has some right sided pain.  Past Medical History:  Diagnosis Date  . Atrial flutter ([place])    A. s/p prior ablation;  B.  s/p CTI ablation 10/24/10 (Dr. Caryl Comes)  . CAD (coronary artery disease)    A.  s/p CFX in the past;   B.  cath 06/2010: LAD 30-40%, D1 50%, OM1 50%, AVCFX stent 30%, dCFX 70-80% (med Rx), mRCA 40%;      C.  Echo 06/2010: EF 60-65%, mod LVH; mild LAE     . Cubital tunnel syndrome   . Degenerative disc disease   . Heart block   . Hx of adenomatous colonic polyps   . Hyperlipidemia   . Hypertension   . Multiple myeloma   . Sleep apnea   . 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 Venous insufficiency     Past Surgical History:  Procedure Laterality Date  . COLONOSCOPY  10-07-2009   TA polyp, tics, hems   . CORONARY ANGIOPLASTY WITH STENT PLACEMENT  2005  . PACEMAKER PLACEMENT  2011  . POLYPECTOMY    . Right olecranon nerve surgery    . VENTRAL HERNIA REPAIR      Family History  Problem Relation Age of Onset  . Heart disease Father   . Cancer Father        prostate  . Heart disease Brother   . Colon cancer Neg Hx     Social History:  reports that he quit smoking about 12 years ago. His smoking use included Cigarettes. He has a 49.00 pack-year smoking history. He has quit using smokeless tobacco. He reports that he drinks about 16.8 oz of  alcohol per week . He reports that he does not use drugs.  Allergies:  Allergies  Allergen Reactions  . Bee Venom Anaphylaxis         Medications:  Prior to Admission:  Prescriptions Prior to Admission  Medication Sig Dispense Refill Last Dose  . atenolol (TENORMIN) 50 MG tablet TAKE ONE TABLET BY MOUTH ONCE DAILY (Patient taking differently: Take 50 mg by mouth in the morning) 90 tablet 3 05/19/2017 at am  . atorvastatin (LIPITOR) 10 MG tablet Take 10 mg by mouth daily.   05/19/2017 at am  . EPINEPHrine 0.3 mg/0.3 mL IJ SOAJ injection Inject 0.3 mg into the muscle once as needed for anaphylaxis.   PRN at PRN  . fluorouracil (EFUDEX) 5 % cream Apply 1 application topically daily as needed (for skin cancer treatment). APPLY TO AFFECTED AREAS OF SCALP   Past Month at Unknown time  . Glucosamine HCl 1000 MG TABS Take 2 tablets by mouth daily.   05/19/2017 at Unknown time  . ketotifen (ALAWAY) 0.025 % ophthalmic solution Place 1 drop into both eyes 2 (two) times daily.   05/19/2017 at am  .  losartan (COZAAR) 100 MG tablet Take 100 mg by mouth daily.   05/19/2017 at am  . Multiple Vitamin (MULTIVITAMIN) capsule Take 1 capsule by mouth daily.     05/19/2017 at Unknown time  . nitroGLYCERIN (NITROSTAT) 0.4 MG SL tablet Place 1 tablet (0.4 mg total) under the tongue every 5 (five) minutes as needed for chest pain. Reported on 12/07/2015 25 tablet 3 PRN at PRN  . NON FORMULARY CPAP Machine at night   05/18/2017 at pm  . Omega-3 Fatty Acids (FISH OIL) 1200 MG CAPS Take 1 capsule by mouth daily.     05/19/2017 at Unknown time  . rivaroxaban (XARELTO) 20 MG TABS tablet Take 1 tablet (20 mg total) by mouth daily with supper. 90 tablet 3 05/18/2017 at 1400  . thiamine 100 MG tablet Take 100 mg by mouth daily.   05/19/2017 at Unknown time    Results for orders placed or performed during the hospital encounter of 05/19/17 (from the past 48 hour(s))  Basic metabolic panel     Status: None   Collection Time: 05/19/17  1:27  PM  Result Value Ref Range   Sodium 136 135 - 145 mmol/L   Potassium 3.9 3.5 - 5.1 mmol/L   Chloride 101 101 - 111 mmol/L   CO2 22 22 - 32 mmol/L   Glucose, Bld 91 65 - 99 mg/dL   BUN 6 6 - 20 mg/dL   Creatinine, Ser 0.94 0.61 - 1.24 mg/dL   Calcium 9.4 8.9 - 10.3 mg/dL   GFR calc non Af Amer >60 >60 mL/min   GFR calc Af Amer >60 >60 mL/min    Comment: (NOTE) The eGFR has been calculated using the CKD EPI equation. This calculation has not been validated in all clinical situations. eGFR's persistently <60 mL/min signify possible Chronic Kidney Disease.    Anion gap 13 5 - 15  CBC     Status: Abnormal   Collection Time: 05/19/17  1:27 PM  Result Value Ref Range   WBC 8.0 4.0 - 10.5 K/uL   RBC 3.53 (L) 4.22 - 5.81 MIL/uL   Hemoglobin 12.1 (L) 13.0 - 17.0 g/dL   HCT 35.5 (L) 39.0 - 52.0 %   MCV 100.6 (H) 78.0 - 100.0 fL   MCH 34.3 (H) 26.0 - 34.0 pg   MCHC 34.1 30.0 - 36.0 g/dL   RDW 12.5 11.5 - 15.5 %   Platelets 215 150 - 400 K/uL  I-stat troponin, ED     Status: None   Collection Time: 05/19/17  1:38 PM  Result Value Ref Range   Troponin i, poc 0.00 0.00 - 0.08 ng/mL   Comment 3            Comment: Due to the release kinetics of cTnI, a negative result within the first hours of the onset of symptoms does not rule out myocardial infarction with certainty. If myocardial infarction is still suspected, repeat the test at appropriate intervals.     Dg Chest 2 View  Result Date: 05/19/2017 CLINICAL DATA:  Chest pain and shortness of breath after lawnmower accident 2 days ago. EXAM: CHEST  2 VIEW COMPARISON:  Radiographs of December 03, 2014. FINDINGS: The heart size and mediastinal contours are within normal limits. Left-sided pacemaker is in good position. Atherosclerosis of thoracic aorta is noted. Left lung is clear. Large right pneumothorax is noted. Bony thorax is unremarkable. IMPRESSION: Large right pneumothorax is noted. Critical Value/emergent results were called by  telephone at the  time of interpretation on 05/19/2017 at 2:12 pm to Westley Chandler, the patient's nurse, who verbally acknowledged these results and will immediately contact the physician. Electronically Signed   By: Marijo Conception, M.D.   On: 05/19/2017 14:13   Dg Chest Portable 1 View  Result Date: 05/19/2017 CLINICAL DATA:  76 year old male with a history of chest tube placement after pneumothorax EXAM: PORTABLE CHEST 1 VIEW COMPARISON:  05/19/2017 FINDINGS: Cardiomediastinal silhouette unchanged in size and contour. Calcifications of the aortic arch. Unchanged cardiac pacing device on the left chest wall with 2 leads in place. Interval placement of right-sided pigtail thoracostomy tube which terminates at the medial apex of the right lung. There has been re-expansion of the right lung with no visualized pneumothorax on the current study. Low lung volumes with coarsened interstitial markings. No displaced fracture IMPRESSION: Interval placement of right-sided pigtail thoracostomy tube, terminating at the right apex, with resolution of right pneumothorax. Electronically Signed   By: Corrie Mckusick D.O.   On: 05/19/2017 15:59    Review of Systems  Respiratory: Positive for shortness of breath.   Cardiovascular: Positive for chest pain.  Musculoskeletal: Positive for myalgias.       Arthralgias   Blood pressure (!) 153/74, pulse 65, temperature 98.4 F (36.9 C), temperature source Oral, resp. rate 18, height 6' (1.829 m), weight 213 lb 14.4 oz (97 kg), SpO2 96 %. Physical Exam  Vitals reviewed. Constitutional: He is oriented to person, place, and time. He appears well-developed and well-nourished. No distress.  HENT:  Head: Normocephalic and atraumatic.  Eyes: Conjunctivae and EOM are normal. No scleral icterus.  Neck: Neck supple. No tracheal deviation present. No thyromegaly present.  Cardiovascular: Normal rate, regular rhythm and normal heart sounds.   Respiratory: Effort normal and breath sounds  normal. No respiratory distress. He has no wheezes. He has no rales.  GI: Soft. He exhibits no distension. There is no tenderness.  Lymphadenopathy:    He has no cervical adenopathy.  Neurological: He is alert and oriented to person, place, and time.  Skin: Skin is warm and dry.    Assessment/Plan: 76 yo man who presented today with right sided CP and shortness of breath. Was found to have a right pneumothorax and a chest tube was placed.   Currently CT on suction with no air leak  It is impossible to know if this is a traumatic or spontaneous pneumothorax. He is not aware of any trauma to the chest and the timing would be unusual for a traumatic pneumothorax. In either event the treatment, chest tube placement, is the same.  He does not have an air leak at present and CXR shows good reexpansion of the right lung, so hopefully will be able to get the tube out relatively soon.  Will follow  Melrose Nakayama 05/19/2017, 8:05 PM

## 2017-05-19 NOTE — H&P (Signed)
History and Physical    [name] LSL:[id number] DOB: 1941/06/20 DOA: 05/19/2017  PCP: [name], MD  Patient coming from: Home   Chief Complaint: SOB  HPI: [name] is a 76 y.o. 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  [name] reports that 2 days ago, he tipped over his riding lawn mower and was stuck under it.  Some passers-by helped him to get up.  He had some bruising and pain, but didn't think much of it.  He was stiff the next day.  However, this morning at 3am, he awoke suddenly from sleep with SOB, a feeling like he couldn't quite catch his breath.  He waited for a while to see if it would improve on it's own and it didn't so he presented to the ED.  He has no chest pain, fever, chills.  Prior to 2 days ago he was in his normal state of health and he actual drove himself home once he was out from under the [employer].  He is a former smoker, quit > 10 years ago, but has never had emphysema or lung problems.    ED Course: In the ED, CXR showed a pneumothorax on the right and a chest tube was inserted.  Blood work was relatively normal except some mild H/H reduction.  CT surgery was consulted.   Review of Systems: As per HPI otherwise 10 point review of systems negative.   Past Medical History:  Diagnosis Date  . Atrial flutter ([place])    A. s/p prior ablation;  B.  s/p CTI ablation 10/24/10 (Dr. Caryl Comes)  . CAD (coronary artery disease)    A.  s/p CFX in the past;   B.  cath 06/2010: LAD 30-40%, D1 50%, OM1 50%, AVCFX stent 30%, dCFX 70-80% (med Rx), mRCA 40%;      C.  Echo 06/2010: EF 60-65%, mod LVH; mild LAE     . Cubital tunnel syndrome   . Degenerative disc disease   . Heart block   . Hx of adenomatous colonic polyps   . Hyperlipidemia   . Hypertension   . Multiple myeloma   . Sleep apnea   . 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 Venous insufficiency     Past Surgical History:  Procedure Laterality Date  . COLONOSCOPY  10-07-2009   TA polyp, tics, hems   . CORONARY ANGIOPLASTY WITH STENT PLACEMENT  2005  . PACEMAKER PLACEMENT  2011  . POLYPECTOMY    . Right olecranon nerve surgery    . VENTRAL HERNIA REPAIR       reports that he quit smoking about 12 years ago. His smoking use included Cigarettes. He has a 49.00 pack-year smoking history. He has quit using smokeless tobacco. He reports that he drinks about 16.8 oz of alcohol per week . He reports that he does not use drugs.  Allergies  Allergen Reactions  . Bee Venom Anaphylaxis         Family History  Problem Relation Age of Onset  . Heart disease Father   . Cancer Father        prostate  . Heart disease Brother   . Colon cancer Neg Hx     Prior to Admission medications   Medication Sig Start Date End Date Taking? Authorizing Provider  atenolol (  TENORMIN) 50 MG tablet TAKE ONE TABLET BY MOUTH ONCE DAILY 06/01/16   Josue Hector, MD  atorvastatin (LIPITOR) 10 MG tablet Take 10 mg by mouth daily.    [provider]  EPINEPHrine 0.3 mg/0.3 mL IJ SOAJ injection Inject 0.3 mg into the muscle once as needed for anaphylaxis. 04/25/17   [provider]  fluorouracil (EFUDEX) 5 % cream  03/09/17   [provider]  Glucosamine HCl 1000 MG TABS Take 2 tablets by mouth daily.    [provider]  ketorolac (ACULAR) 0.4 % SOLN  09/09/15   [provider]  losartan (COZAAR) 100 MG tablet Take 100 mg by mouth daily.    [provider]  Multiple Vitamin (MULTIVITAMIN) capsule Take 1 capsule by mouth daily.      [provider]  mupirocin ointment (BACTROBAN) 2 %  03/25/17   [provider]  nitroGLYCERIN (NITROSTAT) 0.4 MG SL tablet Place 1 tablet (0.4 mg total) under the tongue every 5 (five) minutes as needed for chest pain. Reported on 12/07/2015 04/15/16   Josue Hector, MD  NON  FORMULARY CPAP Machine at night    [provider]  Omega-3 Fatty Acids (FISH OIL) 1200 MG CAPS Take 1 capsule by mouth daily.      [provider]  rivaroxaban (XARELTO) 20 MG TABS tablet Take 1 tablet (20 mg total) by mouth daily with supper. 09/21/16   Deboraha Sprang, MD  thiamine 100 MG tablet Take 100 mg by mouth daily.    [provider]    Physical Exam: Vitals:   05/19/17 1515 05/19/17 1530 05/19/17 1545 05/19/17 1600  BP: (!) 145/78 (!) 145/78 127/76 (!) 154/73  Pulse: 67 72 (!) 59 66  Resp: 19 20 17 14   Temp:      TempSrc:      SpO2: 100% 100% 99% 100%  Weight:      Height:        Constitutional: NAD, calm, comfortable, lying in bed Vitals:   05/19/17 1515 05/19/17 1530 05/19/17 1545 05/19/17 1600  BP: (!) 145/78 (!) 145/78 127/76 (!) 154/73  Pulse: 67 72 (!) 59 66  Resp: 19 20 17 14   Temp:      TempSrc:      SpO2: 100% 100% 99% 100%  Weight:      Height:       Eyes: PERRL, lids and conjunctivae normal ENMT: Mucous membranes are moist.  Neck: normal, supple, no masses palpated.  Respiratory: clear to auscultation bilaterally, no wheezing, no crackles. Normal respiratory effort. He has a chest tube in place in the axillary line on the right chest.  Cardiovascular: RR, NR, no murmur. He has a PCM in place in the left upper chest which is clean and dry.  Leads appear in place on CXR.  Abdomen: no tenderness, no masses palpated. No hepatosplenomegaly. +BS.  He has a well healed midline scar from hernia repair.  Musculoskeletal: no clubbing / cyanosis. He has some nail changes in the large toes bilaterally and some missing nails on the toes.  Skin: no rashes, He has bruising to the left ankle, but is able to move it well and bruising to the right anterior chest.   Neurologic: Grossly intact, moving all extremities.  Psychiatric: Normal judgment and insight. Alert and oriented x 3. Normal mood.    Labs on Admission: I have personally reviewed  following labs and imaging studies  CBC:  Recent Labs Lab 05/19/17  1327  WBC 8.0  HGB 12.1*  HCT 35.5*  MCV 100.6*  PLT 889   Basic Metabolic Panel:  Recent Labs Lab 05/19/17 1327  NA 136  K 3.9  CL 101  CO2 22  GLUCOSE 91  BUN 6  CREATININE 0.94  CALCIUM 9.4   GFR: Estimated Creatinine Clearance: 74.7 mL/min (by C-G formula based on SCr of 0.94 mg/dL). Liver Function Tests: No results for input(s): AST, ALT, ALKPHOS, BILITOT, PROT, ALBUMIN in the last 168 hours. No results for input(s): LIPASE, AMYLASE in the last 168 hours. No results for input(s): AMMONIA in the last 168 hours. Coagulation Profile: No results for input(s): INR, PROTIME in the last 168 hours. Cardiac Enzymes: No results for input(s): CKTOTAL, CKMB, CKMBINDEX, TROPONINI in the last 168 hours. BNP (last 3 results) No results for input(s): PROBNP in the last 8760 hours. HbA1C: No results for input(s): HGBA1C in the last 72 hours. CBG: No results for input(s): GLUCAP in the last 168 hours. Lipid Profile: No results for input(s): CHOL, HDL, LDLCALC, TRIG, CHOLHDL, LDLDIRECT in the last 72 hours. Thyroid Function Tests: No results for input(s): TSH, T4TOTAL, FREET4, T3FREE, THYROIDAB in the last 72 hours. Anemia Panel: No results for input(s): VITAMINB12, FOLATE, FERRITIN, TIBC, IRON, RETICCTPCT in the last 72 hours. Urine analysis:    Component Value Date/Time   COLORURINE YELLOW 04/28/2013 1949   APPEARANCEUR CLEAR 04/28/2013 1949   LABSPEC 1.006 04/28/2013 1949   PHURINE 6.0 04/28/2013 1949   GLUCOSEU NEGATIVE 04/28/2013 1949   HGBUR NEGATIVE 04/28/2013 1949   BILIRUBINUR NEGATIVE 04/28/2013 1949   KETONESUR NEGATIVE 04/28/2013 1949   PROTEINUR NEGATIVE 04/28/2013 1949   UROBILINOGEN 0.2 04/28/2013 1949   NITRITE NEGATIVE 04/28/2013 1949   LEUKOCYTESUR NEGATIVE 04/28/2013 1949    Radiological Exams on Admission: Dg Chest 2 View  Result Date: 05/19/2017 CLINICAL DATA:  Chest pain and  shortness of breath after lawnmower accident 2 days ago. EXAM: CHEST  2 VIEW COMPARISON:  Radiographs of December 03, 2014. FINDINGS: The heart size and mediastinal contours are within normal limits. Left-sided pacemaker is in good position. Atherosclerosis of thoracic aorta is noted. Left lung is clear. Large right pneumothorax is noted. Bony thorax is unremarkable. IMPRESSION: Large right pneumothorax is noted. Critical Value/emergent results were called by telephone at the time of interpretation on 05/19/2017 at 2:12 pm to Westley Chandler, the patient's nurse, who verbally acknowledged these results and will immediately contact the physician. Electronically Signed   By: Marijo Conception, M.D.   On: 05/19/2017 14:13   Dg Chest Portable 1 View  Result Date: 05/19/2017 CLINICAL DATA:  76 year old male with a history of chest tube placement after pneumothorax EXAM: PORTABLE CHEST 1 VIEW COMPARISON:  05/19/2017 FINDINGS: Cardiomediastinal silhouette unchanged in size and contour. Calcifications of the aortic arch. Unchanged cardiac pacing device on the left chest wall with 2 leads in place. Interval placement of right-sided pigtail thoracostomy tube which terminates at the medial apex of the right lung. There has been re-expansion of the right lung with no visualized pneumothorax on the current study. Low lung volumes with coarsened interstitial markings. No displaced fracture IMPRESSION: Interval placement of right-sided pigtail thoracostomy tube, terminating at the right apex, with resolution of right pneumothorax. Electronically Signed   By: Corrie Mckusick D.O.   On: 05/19/2017 15:59    EKG: Independently reviewed. V paced, low voltage  Assessment/Plan Pneumothorax on right - Chest tube placed.  Presumed traumatic, however, SOB started about 36 hours after  trauma.  Consider spontaneous and possibly may need a chest CT for further work up - CT surgery consulted by ED, should see while here, await further recs -  Chest tube to water seal - Pain control with morphine and oxycodone - Oxygen as needed to maintain O2 saturation.   Essential hypertension - BP mildly high, continue losartan and atenolol    H/O Multiple myeloma - Last OV in March with Dr. Julien Nordmann reviewed, he is on observation - Blood work relatively normal today except for some mild low H/H, but rising MCV - Recheck CMET and CBC in the AM    Obstructive sleep apnea - QHS CPAP ordered    H/O Afib with PACEMAKER, [employer] dual-chamber - Pacer is pacing - Continue atenolol - Holding xarelto for 1-2 days in the setting of procedure    Coronary artery disease-nonobstructive catheter in October 2011 - Chest pain free - Continue atenolol, atorvastatin, losartan    Daily ETOH use.  - CIWA protocol, he could not tell me is he has every withdrawn, but notes 3 beers and a glass of red wine daily.    DVT prophylaxis: Lovenox Code Status: Full Disposition Plan: Admit for chest tube management Consults called: CT surgery by ED, Roxan Hockey Admission status: Telemetry, inpatient   Gilles Chiquito MD [facility] Pager [phone]  If 7PM-7AM, please contact night-coverage [email or web address] Password [facility]  05/19/2017, 4:57 PM

## 2017-05-19 NOTE — ED Notes (Signed)
Floor RN discharging another Pt and will call back in 10 min.

## 2017-05-19 NOTE — ED Triage Notes (Signed)
Pt endorses generalized chest pain that bean this morning when waking up. Upon assessment pt has minimal breath sounds on the right side and endorses having an accident on his lawnmower 2 days ago where he landed on his right side. Pt has pacemaker and hx of htn. VSS. Pt Axox4. Tachypneic in triage.

## 2017-05-19 NOTE — ED Notes (Signed)
Dr. Regenia Skeeter at bedside for chest tube insertion

## 2017-05-20 ENCOUNTER — Inpatient Hospital Stay (HOSPITAL_COMMUNITY): Payer: Medicare HMO

## 2017-05-20 DIAGNOSIS — I2510 Atherosclerotic heart disease of native coronary artery without angina pectoris: Secondary | ICD-10-CM

## 2017-05-20 DIAGNOSIS — J939 Pneumothorax, unspecified: Secondary | ICD-10-CM

## 2017-05-20 DIAGNOSIS — G4733 Obstructive sleep apnea (adult) (pediatric): Secondary | ICD-10-CM

## 2017-05-20 DIAGNOSIS — I2583 Coronary atherosclerosis due to lipid rich plaque: Secondary | ICD-10-CM

## 2017-05-20 DIAGNOSIS — I10 Essential (primary) hypertension: Secondary | ICD-10-CM

## 2017-05-20 DIAGNOSIS — Z950 Presence of cardiac pacemaker: Secondary | ICD-10-CM

## 2017-05-20 DIAGNOSIS — C9000 Multiple myeloma not having achieved remission: Secondary | ICD-10-CM

## 2017-05-20 LAB — BASIC METABOLIC PANEL
Anion gap: 9 (ref 5–15)
BUN: 8 mg/dL (ref 6–20)
CHLORIDE: 102 mmol/L (ref 101–111)
CO2: 26 mmol/L (ref 22–32)
CREATININE: 0.9 mg/dL (ref 0.61–1.24)
Calcium: 9.2 mg/dL (ref 8.9–10.3)
GFR calc Af Amer: >60 mL/min (ref >60)
Glucose, Bld: 112 mg/dL — ABNORMAL HIGH (ref 65–99)
POTASSIUM: 4.2 mmol/L (ref 3.5–5.1)
Sodium: 137 mmol/L (ref 135–145)

## 2017-05-20 LAB — CBC
HEMATOCRIT: 34.6 % — AB (ref 39.0–52.0)
Hemoglobin: 11.5 g/dL — ABNORMAL LOW (ref 13.0–17.0)
MCH: 33.7 pg (ref 26.0–34.0)
MCHC: 33.2 g/dL (ref 30.0–36.0)
MCV: 101.5 fL — AB (ref 78.0–100.0)
Platelets: 199 10*3/uL (ref 150–400)
RBC: 3.41 MIL/uL — ABNORMAL LOW (ref 4.22–5.81)
RDW: 12.4 % (ref 11.5–15.5)
WBC: 7.5 10*3/uL (ref 4.0–10.5)

## 2017-05-20 NOTE — Progress Notes (Addendum)
      [address]       [place] [address]             [phone]      Subjective:  Doing okay.  Pain at chest tube sites  Objective: Vital signs in last 24 hours: Temp:  [98.3 F (36.8 C)-98.5 F (36.9 C)] 98.3 F (36.8 C) (09/08 0500) Pulse Rate:  [59-74] 67 (09/07 2302) Cardiac Rhythm: Normal sinus rhythm (09/08 0717) Resp:  [14-22] 17 (09/08 0500) BP: (127-165)/(65-86) 134/65 (09/08 0500) SpO2:  [91 %-100 %] 98 % (09/08 0500) Weight:  [211 lb 14.4 oz (96.1 kg)-216 lb (98 kg)] 211 lb 14.4 oz (96.1 kg) (09/08 0500)  Intake/Output from previous day: 09/07 0701 - 09/08 0700 In: 240 [P.O.:240] Out: 400 [Urine:400]  General appearance: alert, cooperative and no distress Heart: regular rate and rhythm Lungs: clear to auscultation bilaterally Abdomen: soft, non-tender; bowel sounds normal; no masses,  no organomegaly Wound: clean and dry  Lab Results:  Recent Labs  05/19/17 1327 05/20/17 0223  WBC 8.0 7.5  HGB 12.1* 11.5*  HCT 35.5* 34.6*  PLT 215 199   BMET:  Recent Labs  05/19/17 1327 05/20/17 0223  NA 136 137  K 3.9 4.2  CL 101 102  CO2 22 26  GLUCOSE 91 112*  BUN 6 8  CREATININE 0.94 0.90  CALCIUM 9.4 9.2    PT/INR: No results for input(s): LABPROT, INR in the last 72 hours. ABG    Component Value Date/Time   TCO2 23 04/28/2013 1911   CBG (last 3)  No results for input(s): GLUCAP in the last 72 hours.  Assessment/Plan:  1. Traumatic Pneumothorax-chest tube placed in ED.Marland Kitchen No pneumothorax on CXR, no air leak- will place to water seal today 2. Dispo- care per medicine, chest tube to water seal, repeat CXR in AM   LOS: 1 day    BARRETT, ERIN 05/20/2017 No air leak on water seal If CXR Ok in AM will dc chest tube  Remo Lipps C. Roxan Hockey, MD [facility] [phone]

## 2017-05-20 NOTE — Progress Notes (Signed)
[facility] PROGRESS NOTE  [name] WLK:[id number] DOB: [birth year] DOA: 05/19/2017 PCP: [name], MD  Interim summary and HPI  76 y.o. 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  Mr. Jeanbaptiste reports that 2 days ago, he tipped over his riding lawn mower and was stuck under it.  Some passers-by helped him to get up.  He had some bruising and pain, but didn't think much of it.  He was stiff the next day.  However, this morning at 3am, he awoke suddenly from sleep with SOB, a feeling like he couldn't quite catch his breath.  He waited for a while to see if it would improve on it's own and it didn't so he presented to the ED.  He has no chest pain, fever, chills.  Prior to 2 days ago he was in his normal state of health and he actual drove himself home once he was out from under the [employer].  He is a former smoker, quit > 10 years ago, but has never had emphysema or lung problems.    In the ED, CXR showed a pneumothorax on the right and a chest tube was inserted.  Assessment/Plan: 1-right pneumothorax  -presumed traumatic  -no significant SOB, good O2 sat on RA -CXR demonstrating good lung expansion and no air leakage -per CVTS will transition to water seal and repeat CXR in am -will continue supportive care and PRN analgesics.  2-essential HTN -continue losartan and atenolol  -BP slightly elevated, probably from pain -will monitor and adjust meds as needed   3-H/O multiple myeloma -currently on observation -continue follow up as an outpatient with Dr. Steffanie Rainwater -continue QHS CPAP  5-hx of A, fib and s/p Pacemaker implantation  -rate controlled and currently SR -continue atenolol -xarelto on hold for now; will resume at discharge if otherwise ok by CVTS  6-ETOH use -no signs of withdrawal -monitoring on CIWA  7-HLD -continue statins   8-hx of  CAD -normal EKG and no ischemic changes on telemetry  -continue b-blocker, statins and losartan   Code Status: Full code  Family Communication: no family at bedside  Disposition Plan: home when medically stable; planning to transition CT to water seal; if CXR remains stable will d/c CT in am. Appreciate CVTS assistance and rec's.    Consultants:  CVTS  Procedures:  Chest tube placement 05/20/17  Antibiotics:  None   HPI/Subjective: Afebrile, no SOB, no nausea, no vomiting. reporting some chest discomfort around chest tube.   Objective: Vitals:   05/20/17 1046 05/20/17 1401  BP: (!) 143/78 (!) 153/69  Pulse: 63 72  Resp:  (!) 24  Temp:  98.7 F (37.1 C)  SpO2:  90%    Intake/Output Summary (Last 24 hours) at 05/20/17 1906 Last data filed at 05/20/17 1759  Gross per 24 hour  Intake              240 ml  Output              400 ml  Net             -160 ml   Filed Weights   05/19/17 1330 05/19/17 1854 05/20/17 0500  Weight: 98 kg (216 lb) 97 kg (213 lb 14.4 oz) 96.1 kg (211 lb 14.4 oz)    Exam:   General:  Afebrile, complaining of right side chest  discomfort around chest tube; no nausea, no vomiting. Good O2 sat on RA and reporting no SOB.   Cardiovascular: S1 and S2, no rubs, no gallops.  Respiratory: good air movement, no wheezing, no crackles  Abdomen: soft, NT, ND, positive BS  Musculoskeletal: no edema, no cyanosis   Skin: right chest tube in place, patient with bruises appreciated on his right anterior chest.  Data Reviewed: Basic Metabolic Panel:  Recent Labs Lab 05/19/17 1327 05/20/17 0223  NA 136 137  K 3.9 4.2  CL 101 102  CO2 22 26  GLUCOSE 91 112*  BUN 6 8  CREATININE 0.94 0.90  CALCIUM 9.4 9.2   CBC:  Recent Labs Lab 05/19/17 1327 05/20/17 0223  WBC 8.0 7.5  HGB 12.1* 11.5*  HCT 35.5* 34.6*  MCV 100.6* 101.5*  PLT 215 199    Studies: Dg Chest 2 View  Result Date: 05/19/2017 CLINICAL DATA:  Chest pain and shortness of  breath after lawnmower accident 2 days ago. EXAM: CHEST  2 VIEW COMPARISON:  Radiographs of December 03, 2014. FINDINGS: The heart size and mediastinal contours are within normal limits. Left-sided pacemaker is in good position. Atherosclerosis of thoracic aorta is noted. Left lung is clear. Large right pneumothorax is noted. Bony thorax is unremarkable. IMPRESSION: Large right pneumothorax is noted. Critical Value/emergent results were called by telephone at the time of interpretation on 05/19/2017 at 2:12 pm to Westley Chandler, the patient's nurse, who verbally acknowledged these results and will immediately contact the physician. Electronically Signed   By: Marijo Conception, M.D.   On: 05/19/2017 14:13   Dg Chest Port 1 View  Result Date: 05/20/2017 CLINICAL DATA:  Pneumothorax. EXAM: PORTABLE CHEST 1 VIEW COMPARISON:  Radiograph of May 19, 2017. FINDINGS: Stable cardiomediastinal silhouette. Atherosclerosis of thoracic aorta is noted. Left-sided pacemaker is unchanged in position. Stable elevated left hemidiaphragm is noted with associated subsegmental atelectasis. Stable position of right-sided pigtail chest tube with probable minimal right apical pneumothorax. Bony thorax is unremarkable. IMPRESSION: Aortic atherosclerosis. Stable position of right-sided chest tube with probable minimal right apical pneumothorax. Stable elevated left hemidiaphragm with associated subsegmental atelectasis. Electronically Signed   By: Marijo Conception, M.D.   On: 05/20/2017 08:40   Dg Chest Portable 1 View  Result Date: 05/19/2017 CLINICAL DATA:  76 year old male with a history of chest tube placement after pneumothorax EXAM: PORTABLE CHEST 1 VIEW COMPARISON:  05/19/2017 FINDINGS: Cardiomediastinal silhouette unchanged in size and contour. Calcifications of the aortic arch. Unchanged cardiac pacing device on the left chest wall with 2 leads in place. Interval placement of right-sided pigtail thoracostomy tube which terminates at  the medial apex of the right lung. There has been re-expansion of the right lung with no visualized pneumothorax on the current study. Low lung volumes with coarsened interstitial markings. No displaced fracture IMPRESSION: Interval placement of right-sided pigtail thoracostomy tube, terminating at the right apex, with resolution of right pneumothorax. Electronically Signed   By: Corrie Mckusick D.O.   On: 05/19/2017 15:59    Scheduled Meds: . atenolol  50 mg Oral Daily  . atorvastatin  10 mg Oral Daily  . ketorolac  1 drop Both Eyes QID  . losartan  100 mg Oral Daily  . multivitamin with minerals  1 tablet Oral Daily  . omega-3 acid ethyl esters  2 g Oral Daily  . sodium chloride flush  3 mL Intravenous Q12H  . thiamine  100 mg Oral Daily   Continuous Infusions:  Active  Problems:   Multiple myeloma (HCC)   Obstructive sleep apnea   Essential hypertension   PACEMAKER, St. Jude dual-chamber   Coronary artery disease-nonobstructive catheter in October 2011   Pneumothorax on right    Time spent: 30 minutes    Barton Dubois  [facility] Pager [phone]. If 7PM-7AM, please contact night-coverage at [email or web address], password [facility] 05/20/2017, 7:06 PM  LOS: 1 day

## 2017-05-21 ENCOUNTER — Inpatient Hospital Stay (HOSPITAL_COMMUNITY): Payer: Medicare HMO

## 2017-05-21 NOTE — Progress Notes (Signed)
Chest tube removed per protocol, pt tolerated well.  Will continue to monitor.

## 2017-05-21 NOTE — Plan of Care (Signed)
Problem: Pain Managment: Goal: General experience of comfort will improve Outcome: Progressing Patient without pain at chest tube insertion site.

## 2017-05-21 NOTE — Evaluation (Signed)
Physical Therapy Evaluation Patient Details Name: [name] MRN: [id number] DOB: [birth year] Today's Date: 05/21/2017   History of Present Illness   76 y.o. 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  Patient with Rt pneumothorax.  Chest tube removed 05/21/17.  Clinical Impression  Patient presents with problems listed below.  Will benefit from acute PT to maximize functional mobility prior to discharge home.  Patient with decreased strength and balance, impacting mobility, gait, and safety.  Recommend f/u HHPT at d/c for continued therapy, and RW for home use for safety/balance.    Follow Up Recommendations Home health PT;Supervision for mobility/OOB    Equipment Recommendations  Rolling walker with 5" wheels    Recommendations for Other Services       Precautions / Restrictions Precautions Precautions: None Restrictions Weight Bearing Restrictions: No      Mobility  Bed Mobility               General bed mobility comments: Sitting EOB  Transfers Overall transfer level: Needs assistance Equipment used: Rolling walker (2 wheeled) Transfers: Sit to/from [employer] Transfers Sit to Stand: Min guard Stand pivot transfers: Modified independent (Device/Increase time)       General transfer comment: Patient able to transfer bed <> BSC with increased time.  Able to move sit to stand with no physical assist and increased time.  Ambulation/Gait Ambulation/Gait assistance: Min guard Ambulation Distance (Feet): 120 Feet (90' with RW and 25' with no AD) Assistive device: Rolling walker (2 wheeled);None Gait Pattern/deviations: Step-through pattern;Decreased step length - right;Decreased step length - left;Decreased stride length;Shuffle;Trunk flexed;Wide base of support Gait velocity: decreased Gait velocity interpretation: Below normal speed for  age/gender General Gait Details: Patient with flexed posture.  Gait is slow paced with small step length.  Slightly unsteady with no assistive device.  Stairs            Wheelchair Mobility    Modified Rankin (Stroke Patients Only)       Balance Overall balance assessment: Needs assistance         Standing balance support: No upper extremity supported Standing balance-Leahy Scale: Fair                               Pertinent Vitals/Pain Pain Assessment: Faces Faces Pain Scale: Hurts a little bit Pain Location: chest wall / CT site Pain Descriptors / Indicators: Sore Pain Intervention(s): Monitored during session    Home Living Family/patient expects to be discharged to:: Private residence Living Arrangements: Other relatives (Ex-wife lives with patient.) Available Help at Discharge: Family;Available 24 hours/day (Ex-wife 24 hrs / son 6 days/week / dtrs help out) Type of Home: House Home Access: Stairs to enter Entrance Stairs-Rails: Psychiatric nurse of Steps: 4 Home Layout: Able to live on main level with bedroom/bathroom (Basement - does not need to go to basement) Home Equipment: Cane - single point;Grab bars - tub/shower;Wheelchair - manual      Prior Function Level of Independence: Independent with assistive device(s)         Comments: Uses cane occasionally.  Drives.  Does own bathing/dressing, meals, housekeeping, laundry.     Hand Dominance        Extremity/Trunk Assessment   Upper Extremity Assessment Upper Extremity Assessment: Overall WFL for tasks assessed    Lower Extremity Assessment  Lower Extremity Assessment: RLE deficits/detail;LLE deficits/detail RLE Deficits / Details: Strength grossly 4/5 - 4+/5.  Arthritis in knees, receives injections.  LLE Deficits / Details: Strength grossly 4/5 - 4+/5.  Arthritis in knees, receives injections. Appears to have varus deformity during gait.    Cervical / Trunk  Assessment Cervical / Trunk Assessment: Kyphotic  Communication   Communication: HOH (Hearing aids)  Cognition Arousal/Alertness: Awake/alert Behavior During Therapy: WFL for tasks assessed/performed;Flat affect Overall Cognitive Status: Within Functional Limits for tasks assessed                                        General Comments      Exercises     Assessment/Plan    PT Assessment Patient needs continued PT services  PT Problem List Decreased strength;Decreased activity tolerance;Decreased balance;Decreased mobility;Decreased knowledge of use of DME;Cardiopulmonary status limiting activity;Pain       PT Treatment Interventions DME instruction;Gait training;Functional mobility training;Therapeutic activities;Therapeutic exercise;Balance training;Patient/family education    PT Goals (Current goals can be found in the Care Plan section)  Acute Rehab PT Goals Patient Stated Goal: To go home. PT Goal Formulation: With patient Time For Goal Achievement: 05/28/17 Potential to Achieve Goals: Good    Frequency Min 3X/week   Barriers to discharge        Co-evaluation               AM-PAC PT "6 Clicks" Daily Activity  Outcome Measure Difficulty turning over in bed (including adjusting bedclothes, sheets and blankets)?: None Difficulty moving from lying on back to sitting on the side of the bed? : A Lot Difficulty sitting down on and standing up from a chair with arms (e.g., wheelchair, bedside commode, etc,.)?: A Little Help needed moving to and from a bed to chair (including a wheelchair)?: None Help needed walking in hospital room?: A Little Help needed climbing 3-5 steps with a railing? : A Little 6 Click Score: 19    End of Session Equipment Utilized During Treatment: Gait belt Activity Tolerance: Patient tolerated treatment well;Patient limited by fatigue Patient left: in bed;with call bell/phone within reach (sitting EOB) Nurse  Communication: Mobility status (Recommend HHPT and RW) PT Visit Diagnosis: Unsteadiness on feet (R26.81);Muscle weakness (generalized) (M62.81);Difficulty in walking, not elsewhere classified (R26.2);Pain Pain - part of body:  (chest wall)    Time: 8466-5993 PT Time Calculation (min) (ACUTE ONLY): 29 min   Charges:   PT Evaluation $PT Eval Moderate Complexity: 1 Mod PT Treatments $Gait Training: 8-22 mins   PT G Codes:        Carita Pian. Sanjuana Kava, [facility] Acute Rehab Services Pager [place] 05/21/2017, 7:43 PM

## 2017-05-21 NOTE — Progress Notes (Signed)
[facility] PROGRESS NOTE  [name] ZOX:[id number] DOB: [birth year] DOA: 05/19/2017 PCP: [name] Solian, MD  Interim summary and HPI  76 y.o. 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  [name] reports that 2 days ago, he tipped over his riding lawn mower and was stuck under it.  Some passers-by helped him to get up.  He had some bruising and pain, but didn't think much of it.  He was stiff the next day.  However, this morning at 3am, he awoke suddenly from sleep with SOB, a feeling like he couldn't quite catch his breath.  He waited for a while to see if it would improve on it's own and it didn't so he presented to the ED.  He has no chest pain, fever, chills.  Prior to 2 days ago he was in his normal state of health and he actual drove himself home once he was out from under the [employer].  He is a former smoker, quit > 10 years ago, but has never had emphysema or lung problems.    Following CVTS recommendations Chest tube removed today (05/21/17)  Assessment/Plan: 1-right pneumothorax  -presumed traumatic  -Patient breathing remains stable, good oxygen saturation on room air and just complaining of intermittent chest discomfort or the chest tube was placed.  -Repeat x-ray today demonstrated resolution of pneumothorax and good expansion of his right lung; no leakage was appreciated after water seal and coughing maneuvers. Just to remove as recommended by CVTS -Repeat chest x-ray during the morning -Continue when necessary analgesics.  2-essential HTN -continue losartan and atenolol  -BP slightly elevated, probably from pain -will monitor and adjust meds as needed   3-H/O multiple myeloma -currently on observation -continue follow up as an outpatient with [name] -continue QHS CPAP  5-hx of A, fib and s/p Pacemaker implantation  -rate controlled and  currently SR -continue atenolol -xarelto on hold for now; will resume at discharge if otherwise ok by CVTS  6-ETOH use -no signs of withdrawal and the patient expressed he has never had any withdrawal symptoms -monitoring on CIWA  7-HLD -continue statins   8-hx of CAD -no ischemic changes on telemetry or EKG -continue b-blocker, statins and losartan   Code Status: Full code  Family Communication: no family at bedside  Disposition Plan: Patient doing much better. Chest tube has been removed by CVTS, plan is to repeat chest x-ray in the morning and if stable he will be cleared for discharge. Will ask physical therapy to evaluate him since he feels weak and deconditioned   Consultants:  CVTS  Procedures:  Chest tube placement 05/19/17>>>05/21/17  Antibiotics:  None   HPI/Subjective: Feeling okay. Patient is afebrile, denies chest pain or shortness of breath. Feeling weak and deconditioned.   Objective: Vitals:   05/21/17 0416 05/21/17 0938  BP: 139/73 139/67  Pulse: 73 60  Resp: (!) 22   Temp: 99.9 F (37.7 C)   SpO2: 96%     Intake/Output Summary (Last 24 hours) at 05/21/17 1701 Last data filed at 05/21/17 0836  Gross per 24 hour  Intake              240 ml  Output              925 ml  Net             -  685 ml   Filed Weights   05/19/17 1854 05/20/17 0500 05/21/17 0416  Weight: 97 kg (213 lb 14.4 oz) 96.1 kg (211 lb 14.4 oz) 94.8 kg (208 lb 14.4 oz)    Exam:   General:  Afebrile, still complaining of intermittent chest discomfort and feeling weak. No significant shortness of breath. Patient denies nausea, vomiting, abdominal pain, or any other complaints. Good oxygen saturation on room air.   Cardiovascular: S1 and S2, no rub, no gallops, no JVD.   Respiratory: No wheezing, no crackles, good air movement bilaterally.   Abdomen: Soft, nontender, nondistended, positive bowel sounds.   Musculoskeletal: No edema, no cyanosis, no clubbing.  Skin: Patient  right chest tube has been removed, same bruises appreciated on his right anterior chest.   Data Reviewed: Basic Metabolic Panel:  Recent Labs Lab 05/19/17 1327 05/20/17 0223  NA 136 137  K 3.9 4.2  CL 101 102  CO2 22 26  GLUCOSE 91 112*  BUN 6 8  CREATININE 0.94 0.90  CALCIUM 9.4 9.2   CBC:  Recent Labs Lab 05/19/17 1327 05/20/17 0223  WBC 8.0 7.5  HGB 12.1* 11.5*  HCT 35.5* 34.6*  MCV 100.6* 101.5*  PLT 215 199    Studies: Dg Chest 1v Repeat Same Day  Result Date: 05/21/2017 CLINICAL DATA:  Encounter for chest tube removal. EXAM: CHEST - 1 VIEW SAME DAY COMPARISON:  Radiograph of same day. FINDINGS: Stable cardiomediastinal silhouette. Stable mild left basilar subsegmental atelectasis with elevated left hemidiaphragm. Left-sided pacemaker is unchanged in position. Right-sided chest tube has been removed. Minimal right apical pneumothorax is noted. Minimal right basilar subsegmental atelectasis is noted. No pneumothorax is noted. IMPRESSION: Minimal right apical pneumothorax is noted status post chest tube removal. Electronically Signed   By: Marijo Conception, M.D.   On: 05/21/2017 14:36   Dg Chest Port 1 View  Result Date: 05/21/2017 CLINICAL DATA:  Pneumothorax EXAM: PORTABLE CHEST 1 VIEW COMPARISON:  92,018 FINDINGS: Pigtail catheter remains in the right chest. No definite pneumothorax. Hypoventilation with mild progression of bibasilar atelectasis. Negative for heart failure or edema. Small left effusion. IMPRESSION: Negative for pneumothorax Hypoventilation with mild progression of bibasilar atelectasis. Small left effusion Electronically Signed   By: Franchot Gallo M.D.   On: 05/21/2017 07:14   Dg Chest Port 1 View  Result Date: 05/20/2017 CLINICAL DATA:  Pneumothorax. EXAM: PORTABLE CHEST 1 VIEW COMPARISON:  Radiograph of May 19, 2017. FINDINGS: Stable cardiomediastinal silhouette. Atherosclerosis of thoracic aorta is noted. Left-sided pacemaker is unchanged in  position. Stable elevated left hemidiaphragm is noted with associated subsegmental atelectasis. Stable position of right-sided pigtail chest tube with probable minimal right apical pneumothorax. Bony thorax is unremarkable. IMPRESSION: Aortic atherosclerosis. Stable position of right-sided chest tube with probable minimal right apical pneumothorax. Stable elevated left hemidiaphragm with associated subsegmental atelectasis. Electronically Signed   By: Marijo Conception, M.D.   On: 05/20/2017 08:40    Scheduled Meds: . atenolol  50 mg Oral Daily  . atorvastatin  10 mg Oral Daily  . ketorolac  1 drop Both Eyes QID  . losartan  100 mg Oral Daily  . multivitamin with minerals  1 tablet Oral Daily  . omega-3 acid ethyl esters  2 g Oral Daily  . sodium chloride flush  3 mL Intravenous Q12H  . thiamine  100 mg Oral Daily   Continuous Infusions:  Active Problems:   Multiple myeloma (HCC)   Obstructive sleep apnea   Essential hypertension  PACEMAKER, St. Jude dual-chamber   Coronary artery disease-nonobstructive catheter in October 2011   Pneumothorax on right    Time spent: 25 minutes    Barton Dubois  [facility] Pager [phone]. If 7PM-7AM, please contact night-coverage at [email or web address], password [facility] 05/21/2017, 5:01 PM  LOS: 2 days

## 2017-05-21 NOTE — Progress Notes (Addendum)
      [address]       [place] [address]             [phone]      Subjective:  Complains of constipation.  Currently on bedside commode.  Objective: Vital signs in last 24 hours: Temp:  [98 F (36.7 C)-99.9 F (37.7 C)] 99.9 F (37.7 C) (09/09 0416) Pulse Rate:  [60-73] 60 (09/09 0938) Cardiac Rhythm: Normal sinus rhythm (09/09 0700) Resp:  [18-24] 22 (09/09 0416) BP: (134-153)/(67-82) 139/67 (09/09 0938) SpO2:  [90 %-96 %] 96 % (09/09 0416) Weight:  [208 lb 14.4 oz (94.8 kg)] 208 lb 14.4 oz (94.8 kg) (09/09 0416)  Intake/Output from previous day: 09/08 0701 - 09/09 0700 In: 240 [P.O.:240] Out: 800 [Urine:800] Intake/Output this shift: Total I/O In: -  Out: 125 [Urine:125]  General appearance: alert, cooperative and no distress Heart: regular rate and rhythm Lungs: clear to auscultation bilaterally Wound: clean and dry  Lab Results:  Recent Labs  05/19/17 1327 05/20/17 0223  WBC 8.0 7.5  HGB 12.1* 11.5*  HCT 35.5* 34.6*  PLT 215 199   BMET:  Recent Labs  05/19/17 1327 05/20/17 0223  NA 136 137  K 3.9 4.2  CL 101 102  CO2 22 26  GLUCOSE 91 112*  BUN 6 8  CREATININE 0.94 0.90  CALCIUM 9.4 9.2    PT/INR: No results for input(s): LABPROT, INR in the last 72 hours. ABG    Component Value Date/Time   TCO2 23 04/28/2013 1911   CBG (last 3)  No results for input(s): GLUCAP in the last 72 hours.  Assessment/Plan:  1.Traumatic Pneumothorax-S/P Chest tube placement- no air leak present, CXR is free from pneumothorax- if okay with Dr. Roxan Hockey will d/c chest tube today 2. Dispo- care per primary, likely remove chest tube today will discuss with Dr. Roxan Hockey, repeat CXR in AM    LOS: 2 days    BARRETT, ERIN 05/21/2017 Patient seen and examined, agree with above Chest tube removed Will check PCXR now PA and lateral CXR in AM  Aiko Belko C. Roxan Hockey, MD [facility] [phone]

## 2017-05-22 ENCOUNTER — Inpatient Hospital Stay (HOSPITAL_COMMUNITY): Payer: Medicare HMO

## 2017-05-22 ENCOUNTER — Other Ambulatory Visit: Payer: Medicare HMO

## 2017-05-22 MED ORDER — ACETAMINOPHEN 325 MG PO TABS: 650.0000 mg | ORAL_TABLET | Freq: Four times a day (QID) | ORAL | 0 refills | Status: DC | PRN

## 2017-05-22 NOTE — Progress Notes (Signed)
Physician Discharge Summary  [name]:[id number] DOB: 1941/09/12 DOA: 05/19/2017  PCP: [name] Solian, MD  Admit date: 05/19/2017 Discharge date: 05/22/2017  Time spent: 35 minutes  Recommendations for Outpatient Follow-up:  Reassess CBC to follow Hgb trend  Repeat BMET to follow electrolytes and renal function   Discharge Diagnoses:  Active Problems:   Multiple myeloma ([place])   Obstructive sleep apnea   Essential hypertension   PACEMAKER, [employer] dual-chamber   Coronary artery disease-nonobstructive catheter in October 2011   Pneumothorax on right   Discharge Condition: stable and improved. Discharge home with instructions to follow up with PCP in 10 days.  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 [name] reports that 2 days ago, he tipped over his riding lawn mower and was stuck under it. Some passers-by helped him to get up. He had some bruising and pain, but didn't think much of it. He was stiff the next day. However, this morning at 3am, he awoke suddenly from sleep with SOB, a feeling like he couldn't quite catch his breath. He waited for a while to see if it would improve on it's own and it didn't so he presented to the ED. He has no chest pain, fever, chills. Prior to 2 days ago he was in his normal state of health and he actual drove himself home once he was out from under the [employer]. He is a former smoker, quit >10 years ago, but has never had emphysema or lung problems.   Hospital Course:  1-Right pneumothorax  -presumed traumatic  -Patient breathing remains stable, good oxygen saturation on room air and just complaining  of intermittent chest discomfort were the chest tube was placed.  -Repeat x-ray today demonstrated small apical pneumothorax and complete lung re-inflation. No leakage was appreciated after water seal and coughing maneuvers; chest tube removed on 05/21/17. -Repeat chest x-ray morning of discharge with smaller apical pneumothorax. No infiltrates or other abnormalities -Continue ttylenol PRN for pain; follow up with CVTS as an outpatient   2-essential HTN -continue losartan and atenolol  -BP slightly elevated, probably from pain -advise to follow heart healthy diet   3-H/O multiple myeloma -currently on observation -continue follow up as an outpatient with [name]  4-OSA -continue QHS CPAP  5-hx of A, fib and s/p Pacemaker implantation  -rate controlled and currently SR -continue atenolol -xarelto on hold for now; will resume at discharge if otherwise ok by CVTS  6-ETOH use -no signs of withdrawal and the patient expressed he has never had any withdrawal symptoms -monitored on CIWA protocol   7-HLD -continue statins   8-hx of CAD -no ischemic changes on telemetry or EKG -continue b-blocker, statins and losartan  Procedures:  Chest tube placement 05/19/17>>>05/21/17  Consultations:  CVTS  Discharge Exam: Vitals:   05/22/17 0435 05/22/17 0608  BP: (!) 141/73 136/71  Pulse: 60   Resp: 20   Temp: 98.6 F (37 C)   SpO2: 96%     General:  Afebrile, still complaining of intermittent chest discomfort and feeling weak. No significant shortness of breath. Patient denies nausea, vomiting, abdominal  pain, or any other complaints. Good oxygen saturation on room air.   Cardiovascular: S1 and S2, no rub, no gallops, no JVD.   Respiratory: No wheezing, no crackles, good air movement bilaterally.   Abdomen: Soft, nontender, nondistended, positive bowel sounds.   Musculoskeletal: No edema, no cyanosis, no clubbing.  Skin: Patient right chest tube has been removed,  bruises appreciated on his right anterior chest from trauma.    Discharge Instructions   Discharge Instructions    Diet - low sodium heart healthy    Complete by:  As directed    Discharge instructions    Complete by:  As directed    Take medications as prescribed  Arrange follow up with PCP in 10 days Follow up with cardiothoracic surgery service as instructed (office will contact you with appointment details) Follow heart healthy diet  Keep yourself well hydrated     Current Discharge Medication List    START taking these medications   Details  acetaminophen (TYLENOL) 325 MG tablet Take 2 tablets (650 mg total) by mouth every 6 (six) hours as needed for mild pain. Qty: 40 tablet, Refills: 0      CONTINUE these medications which have NOT CHANGED   Details  atenolol (TENORMIN) 50 MG tablet TAKE ONE TABLET BY MOUTH ONCE DAILY Qty: 90 tablet, Refills: 3    atorvastatin (LIPITOR) 10 MG tablet Take 10 mg by mouth daily.    EPINEPHrine 0.3 mg/0.3 mL IJ SOAJ injection Inject 0.3 mg into the muscle once as needed for anaphylaxis.    fluorouracil (EFUDEX) 5 % cream Apply 1 application topically daily as needed (for skin cancer treatment). APPLY TO AFFECTED AREAS OF SCALP    Glucosamine HCl 1000 MG TABS Take 2 tablets by mouth daily.    ketotifen (ALAWAY) 0.025 % ophthalmic solution Place 1 drop into both eyes 2 (two) times daily.    losartan (COZAAR) 100 MG tablet Take 100 mg by mouth daily.    Multiple Vitamin (MULTIVITAMIN) capsule Take 1 capsule by mouth daily.      nitroGLYCERIN (NITROSTAT) 0.4 MG SL tablet Place 1 tablet (0.4 mg total) under the tongue every 5 (five) minutes as needed for chest pain. Reported on 12/07/2015 Qty: 25 tablet, Refills: 3    NON FORMULARY CPAP Machine at night    Omega-3 Fatty Acids (FISH OIL) 1200 MG CAPS Take 1 capsule by mouth daily.      rivaroxaban (XARELTO) 20 MG TABS tablet Take 1 tablet (20 mg total) by mouth daily with supper. Qty:  90 tablet, Refills: 3    thiamine 100 MG tablet Take 100 mg by mouth daily.      STOP taking these medications     ketorolac (ACULAR) 0.4 % SOLN        Allergies  Allergen Reactions  . Bee Venom Anaphylaxis        Follow-up Information    [name] Nakayama, MD Follow up on 06/06/2017.   Specialty:  Cardiothoracic Surgery Why:  Please get PA/LAT CXR at [facility], which is located on first floor of our office building on 06/06/2017 at 12:30 pm;Appointment time is at 1:00 pm Contact information: [address] [place] [address] [phone]        [facility] Follow up.   Specialty:  Home Health Services Why:  Physical Therapy Contact information: [place] [address] [phone]        [name] Solian, MD. Schedule  an appointment as soon as possible for a visit in 10 day(s).   Specialty:  Internal Medicine Contact information: [place] [address] [phone]           The results of significant diagnostics from this hospitalization (including imaging, microbiology, ancillary and laboratory) are listed below for reference.    Significant Diagnostic Studies: Dg Chest 2 View  Result Date: 05/22/2017 CLINICAL DATA:  Status post chest tube removal EXAM: CHEST  2 VIEW COMPARISON:  May 21, 2017 FINDINGS: There is a minimal right apical pneumothorax that tension component. There is bibasilar atelectasis. There is no edema or consolidation. The heart size and pulmonary vascularity are normal. Pacemaker leads are attached to the right atrium and right ventricle. There is aortic atherosclerosis. No adenopathy. There is acromioclavicular separation on the left. IMPRESSION: Minimal right apical pneumothorax without tension component, minimally smaller than 1 day prior. Bibasilar atelectasis. No edema or consolidation. Stable cardiac silhouette. There is aortic atherosclerosis.  There is left-sided acromioclavicular separation. Aortic Atherosclerosis (ICD10-I70.0). Electronically Signed   By: Lowella Grip III M.D.   On: 05/22/2017 08:21   Dg Chest 2 View  Result Date: 05/19/2017 CLINICAL DATA:  Chest pain and shortness of breath after lawnmower accident 2 days ago. EXAM: CHEST  2 VIEW COMPARISON:  Radiographs of December 03, 2014. FINDINGS: The heart size and mediastinal contours are within normal limits. Left-sided pacemaker is in good position. Atherosclerosis of thoracic aorta is noted. Left lung is clear. Large right pneumothorax is noted. Bony thorax is unremarkable. IMPRESSION: Large right pneumothorax is noted. Critical Value/emergent results were called by telephone at the time of interpretation on 05/19/2017 at 2:12 pm to Westley Chandler, the patient's nurse, who verbally acknowledged these results and will immediately contact the physician. Electronically Signed   By: Marijo Conception, M.D.   On: 05/19/2017 14:13   Dg Chest 1v Repeat Same Day  Result Date: 05/21/2017 CLINICAL DATA:  Encounter for chest tube removal. EXAM: CHEST - 1 VIEW SAME DAY COMPARISON:  Radiograph of same day. FINDINGS: Stable cardiomediastinal silhouette. Stable mild left basilar subsegmental atelectasis with elevated left hemidiaphragm. Left-sided pacemaker is unchanged in position. Right-sided chest tube has been removed. Minimal right apical pneumothorax is noted. Minimal right basilar subsegmental atelectasis is noted. No pneumothorax is noted. IMPRESSION: Minimal right apical pneumothorax is noted status post chest tube removal. Electronically Signed   By: Marijo Conception, M.D.   On: 05/21/2017 14:36   Dg Chest Port 1 View  Result Date: 05/21/2017 CLINICAL DATA:  Pneumothorax EXAM: PORTABLE CHEST 1 VIEW COMPARISON:  92,018 FINDINGS: Pigtail catheter remains in the right chest. No definite pneumothorax. Hypoventilation with mild progression of bibasilar atelectasis. Negative for heart failure or  edema. Small left effusion. IMPRESSION: Negative for pneumothorax Hypoventilation with mild progression of bibasilar atelectasis. Small left effusion Electronically Signed   By: Franchot Gallo M.D.   On: 05/21/2017 07:14   Dg Chest Port 1 View  Result Date: 05/20/2017 CLINICAL DATA:  Pneumothorax. EXAM: PORTABLE CHEST 1 VIEW COMPARISON:  Radiograph of May 19, 2017. FINDINGS: Stable cardiomediastinal silhouette. Atherosclerosis of thoracic aorta is noted. Left-sided pacemaker is unchanged in position. Stable elevated left hemidiaphragm is noted with associated subsegmental atelectasis. Stable position of right-sided pigtail chest tube with probable minimal right apical pneumothorax. Bony thorax is unremarkable. IMPRESSION: Aortic atherosclerosis. Stable position of right-sided chest tube with probable minimal right apical pneumothorax. Stable elevated left hemidiaphragm with associated subsegmental atelectasis. Electronically Signed   By:  Marijo Conception, M.D.   On: 05/20/2017 08:40   Dg Chest Portable 1 View  Result Date: 05/19/2017 CLINICAL DATA:  76 year old male with a history of chest tube placement after pneumothorax EXAM: PORTABLE CHEST 1 VIEW COMPARISON:  05/19/2017 FINDINGS: Cardiomediastinal silhouette unchanged in size and contour. Calcifications of the aortic arch. Unchanged cardiac pacing device on the left chest wall with 2 leads in place. Interval placement of right-sided pigtail thoracostomy tube which terminates at the medial apex of the right lung. There has been re-expansion of the right lung with no visualized pneumothorax on the current study. Low lung volumes with coarsened interstitial markings. No displaced fracture IMPRESSION: Interval placement of right-sided pigtail thoracostomy tube, terminating at the right apex, with resolution of right pneumothorax. Electronically Signed   By: Corrie Mckusick D.O.   On: 05/19/2017 15:59   Labs: Basic Metabolic Panel:  Recent Labs Lab  05/19/17 1327 05/20/17 0223  NA 136 137  K 3.9 4.2  CL 101 102  CO2 22 26  GLUCOSE 91 112*  BUN 6 8  CREATININE 0.94 0.90  CALCIUM 9.4 9.2   CBC:  Recent Labs Lab 05/19/17 1327 05/20/17 0223  WBC 8.0 7.5  HGB 12.1* 11.5*  HCT 35.5* 34.6*  MCV 100.6* 101.5*  PLT 215 199     Signed:  Barton Dubois MD.  [facility] 05/22/2017, 1:40 PM

## 2017-05-22 NOTE — Discharge Instructions (Signed)

## 2017-05-22 NOTE — Progress Notes (Signed)
      [address]       [place] [address]             [phone]           Subjective: Patient just returned from x ray. He states his breathing is fine.  Objective: Vital signs in last 24 hours: Temp:  [98 F (36.7 C)-98.6 F (37 C)] 98.6 F (37 C) (09/10 0435) Pulse Rate:  [60-70] 60 (09/10 0435) Cardiac Rhythm: Normal sinus rhythm (09/09 2000) Resp:  [20-25] 20 (09/10 0435) BP: (136-142)/(67-73) 136/71 (09/10 0608) SpO2:  [96 %-98 %] 96 % (09/10 0435)     Intake/Output from previous day: 09/09 0701 - 09/10 0700 In: -  Out: 725 [Urine:725]   Physical Exam:  Cardiovascular: RRR Pulmonary: Clear to auscultation bilaterally   Lab Results: CBC: Recent Labs  05/19/17 1327 05/20/17 0223  WBC 8.0 7.5  HGB 12.1* 11.5*  HCT 35.5* 34.6*  PLT 215 199   BMET:  Recent Labs  05/19/17 1327 05/20/17 0223  NA 136 137  K 3.9 4.2  CL 101 102  CO2 22 26  GLUCOSE 91 112*  BUN 6 8  CREATININE 0.94 0.90  CALCIUM 9.4 9.2    PT/INR: No results for input(s): LABPROT, INR in the last 72 hours. ABG:  INR: Will add last result for INR, ABG once components are confirmed Will add last 4 CBG results once components are confirmed  Assessment/Plan:  1. CV - SR. 2.  Pulmonary - CXR appears to show stable, trace right apical pneumothorax, elevated left hemidiaphragm and atelectasis. 3. Patient will be contacted with a follow up appointment. Ok to discharge per CVTS   ZIMMERMAN,DONIELLE MPA-C 05/22/2017,8:17 AM

## 2017-05-22 NOTE — Discharge Summary (Signed)
[name] YQM:[id number] DOB: Feb 05, 1941 DOA: 05/19/2017  PCP: [name], MD  Admit date: 05/19/2017 Discharge date: 05/22/2017  Time spent: 35 minutes  Recommendations for Outpatient Follow-up:  Reassess CBC to follow Hgb trend  Repeat BMET to follow electrolytes and renal function   Discharge Diagnoses:  Active Problems:   Multiple myeloma ([place])   Obstructive sleep apnea   Essential hypertension   PACEMAKER, [employer] dual-chamber   Coronary artery disease-nonobstructive catheter in October 2011   Pneumothorax on right   Discharge Condition: stable and improved. Discharge home with instructions to follow up with PCP in 10 days.  Diet recommendation: heart healthy diet        Filed Weights   05/19/17 1854 05/20/17 0500 05/21/17 0416  Weight: 97 kg (213 lb 14.4 oz) 96.1 kg (211 lb 14.4 oz) 94.8 kg (208 lb 14.4 oz)    History of present illness:  76 y.o.malewith medical history significant of Afib, heart block with PCM in place, HTN, HLD, CAD with stenting, h/o MM OSA on CPAP who presents for SOB. [name] reports that 2 days ago, he tipped over his riding lawn mower and was stuck under it. Some passers-by helped him to get up. He had some bruising and pain, but didn't think much of it. He was stiff the next day. However, this morning at 3am, he awoke suddenly from sleep with SOB, a feeling like he couldn't quite catch his breath. He waited for a while to see if it would improve on it's own and it didn't so he presented to the ED. He has no chest pain, fever, chills. Prior to 2 days ago he was in his normal state of health and he actual drove himself home once he was out from under the [employer]. He is a former smoker, quit >10 years ago, but has never had emphysema or lung problems.   Hospital Course:  1-Right pneumothorax  -presumed traumatic  -Patient breathing remains stable, good oxygen saturation on room air and just complaining of  intermittent chest discomfort were the chest tube was placed.  -Repeat x-ray today demonstrated small apical pneumothorax and complete lung re-inflation. No leakage was appreciated after water seal and coughing maneuvers; chest tube removed on 05/21/17. -Repeat chest x-ray morning of discharge with smaller apical pneumothorax. No infiltrates or other abnormalities -Continue ttylenol PRN for pain; follow up with CVTS as an outpatient   2-essential HTN -continue losartan and atenolol  -BP slightly elevated, probably from pain -advise to follow heart healthy diet   3-H/O multiple myeloma -currently on observation -continue follow up as an outpatient with Dr. Julien Nordmann  4-OSA -continue QHS CPAP  5-hx of A, fib and s/p Pacemaker implantation  -rate controlled and currently SR -continue atenolol -xarelto on hold for now; will resume at discharge if otherwise ok by CVTS  6-ETOH use -no signs of withdrawaland the patient expressed he has never had any withdrawal symptoms -monitored on CIWA protocol   7-HLD -continue statins   8-hx of CAD -no ischemic changes on telemetry or EKG -continue b-blocker, statins and losartan  Procedures:  Chest tube placement 05/19/17>>>05/21/17  Consultations:  CVTS  Discharge Exam:     Vitals:   05/22/17 0435 05/22/17 0608  BP: (!) 141/73 136/71  Pulse: 60   Resp: 20   Temp: 98.6 F (37 C)   SpO2: 96%     General:Afebrile, still complaining of intermittent chest discomfort and feeling weak. No significant shortness of breath. Patient denies nausea,  vomiting, abdominal pain, or any other complaints. Good oxygen saturation on room air.   Cardiovascular:S1 and S2, no rub, no gallops, no JVD.   Respiratory:No wheezing, no crackles, good air movement bilaterally.   Abdomen:Soft, nontender, nondistended, positive bowel sounds.   Musculoskeletal: No edema, no cyanosis, no clubbing.  Skin:Patient right chest tube has been  removed, bruises appreciated on his right anterior chest from trauma.    Discharge Instructions   Discharge Instructions    Diet - low sodium heart healthy    Complete by:  As directed    Discharge instructions    Complete by:  As directed    Take medications as prescribed  Arrange follow up with PCP in 10 days Follow up with cardiothoracic surgery service as instructed (office will contact you with appointment details) Follow heart healthy diet  Keep yourself well hydrated         Current Discharge Medication List        START taking these medications   Details  acetaminophen (TYLENOL) 325 MG tablet Take 2 tablets (650 mg total) by mouth every 6 (six) hours as needed for mild pain. Qty: 40 tablet, Refills: 0          CONTINUE these medications which have NOT CHANGED   Details  atenolol (TENORMIN) 50 MG tablet TAKE ONE TABLET BY MOUTH ONCE DAILY Qty: 90 tablet, Refills: 3    atorvastatin (LIPITOR) 10 MG tablet Take 10 mg by mouth daily.    EPINEPHrine 0.3 mg/0.3 mL IJ SOAJ injection Inject 0.3 mg into the muscle once as needed for anaphylaxis.    fluorouracil (EFUDEX) 5 % cream Apply 1 application topically daily as needed (for skin cancer treatment). APPLY TO AFFECTED AREAS OF SCALP    Glucosamine HCl 1000 MG TABS Take 2 tablets by mouth daily.    ketotifen (ALAWAY) 0.025 % ophthalmic solution Place 1 drop into both eyes 2 (two) times daily.    losartan (COZAAR) 100 MG tablet Take 100 mg by mouth daily.    Multiple Vitamin (MULTIVITAMIN) capsule Take 1 capsule by mouth daily.      nitroGLYCERIN (NITROSTAT) 0.4 MG SL tablet Place 1 tablet (0.4 mg total) under the tongue every 5 (five) minutes as needed for chest pain. Reported on 12/07/2015 Qty: 25 tablet, Refills: 3    NON FORMULARY CPAP Machine at night    Omega-3 Fatty Acids (FISH OIL) 1200 MG CAPS Take 1 capsule by mouth daily.      rivaroxaban (XARELTO) 20 MG TABS tablet Take 1  tablet (20 mg total) by mouth daily with supper. Qty: 90 tablet, Refills: 3    thiamine 100 MG tablet Take 100 mg by mouth daily.         STOP taking these medications     ketorolac (ACULAR) 0.4 % SOLN             Allergies  Allergen Reactions  . Bee Venom Anaphylaxis           Follow-up Information    Melrose Nakayama, MD Follow up on 06/06/2017.   Specialty:  Cardiothoracic Surgery Why:  Please get PA/LAT CXR at [facility], which is located on first floor of our office building on 06/06/2017 at 12:30 pm;Appointment time is at 1:00 pm Contact information: [place] [address] [phone]        [facility] Follow up.   Specialty:  Home Health Services Why:  Physical Therapy Contact  information: [place] [address] [phone]        [name], MD. Schedule an appointment as soon as possible for a visit in 10 day(s).   Specialty:  Internal Medicine Contact information: [place] [address] [phone]            The results of significant diagnostics from this hospitalization (including imaging, microbiology, ancillary and laboratory) are listed below for reference.    Significant Diagnostic Studies:  Imaging Results  Dg Chest 2 View  Result Date: 05/22/2017 CLINICAL DATA:  Status post chest tube removal EXAM: CHEST  2 VIEW COMPARISON:  May 21, 2017 FINDINGS: There is a minimal right apical pneumothorax that tension component. There is bibasilar atelectasis. There is no edema or consolidation. The heart size and pulmonary vascularity are normal. Pacemaker leads are attached to the right atrium and right ventricle. There is aortic atherosclerosis. No adenopathy. There is acromioclavicular separation on the left. IMPRESSION: Minimal right apical pneumothorax without tension component, minimally smaller than 1  day prior. Bibasilar atelectasis. No edema or consolidation. Stable cardiac silhouette. There is aortic atherosclerosis. There is left-sided acromioclavicular separation. Aortic Atherosclerosis (ICD10-I70.0). Electronically Signed   By: Lowella Grip III M.D.   On: 05/22/2017 08:21   Dg Chest 2 View  Result Date: 05/19/2017 CLINICAL DATA:  Chest pain and shortness of breath after lawnmower accident 2 days ago. EXAM: CHEST  2 VIEW COMPARISON:  Radiographs of December 03, 2014. FINDINGS: The heart size and mediastinal contours are within normal limits. Left-sided pacemaker is in good position. Atherosclerosis of thoracic aorta is noted. Left lung is clear. Large right pneumothorax is noted. Bony thorax is unremarkable. IMPRESSION: Large right pneumothorax is noted. Critical Value/emergent results were called by telephone at the time of interpretation on 05/19/2017 at 2:12 pm to Westley Chandler, the patient's nurse, who verbally acknowledged these results and will immediately contact the physician. Electronically Signed   By: Marijo Conception, M.D.   On: 05/19/2017 14:13   Dg Chest 1v Repeat Same Day  Result Date: 05/21/2017 CLINICAL DATA:  Encounter for chest tube removal. EXAM: CHEST - 1 VIEW SAME DAY COMPARISON:  Radiograph of same day. FINDINGS: Stable cardiomediastinal silhouette. Stable mild left basilar subsegmental atelectasis with elevated left hemidiaphragm. Left-sided pacemaker is unchanged in position. Right-sided chest tube has been removed. Minimal right apical pneumothorax is noted. Minimal right basilar subsegmental atelectasis is noted. No pneumothorax is noted. IMPRESSION: Minimal right apical pneumothorax is noted status post chest tube removal. Electronically Signed   By: Marijo Conception, M.D.   On: 05/21/2017 14:36   Dg Chest Port 1 View  Result Date: 05/21/2017 CLINICAL DATA:  Pneumothorax EXAM: PORTABLE CHEST 1 VIEW COMPARISON:  92,018 FINDINGS: Pigtail catheter remains in the right  chest. No definite pneumothorax. Hypoventilation with mild progression of bibasilar atelectasis. Negative for heart failure or edema. Small left effusion. IMPRESSION: Negative for pneumothorax Hypoventilation with mild progression of bibasilar atelectasis. Small left effusion Electronically Signed   By: Franchot Gallo M.D.   On: 05/21/2017 07:14   Dg Chest Port 1 View  Result Date: 05/20/2017 CLINICAL DATA:  Pneumothorax. EXAM: PORTABLE CHEST 1 VIEW COMPARISON:  Radiograph of May 19, 2017. FINDINGS: Stable cardiomediastinal silhouette. Atherosclerosis of thoracic aorta is noted. Left-sided pacemaker is unchanged in position. Stable elevated left hemidiaphragm is noted with associated subsegmental atelectasis. Stable position of right-sided pigtail chest tube with probable minimal right apical pneumothorax. Bony thorax is unremarkable. IMPRESSION: Aortic  atherosclerosis. Stable position of right-sided chest tube with probable minimal right apical pneumothorax. Stable elevated left hemidiaphragm with associated subsegmental atelectasis. Electronically Signed   By: Marijo Conception, M.D.   On: 05/20/2017 08:40   Dg Chest Portable 1 View  Result Date: 05/19/2017 CLINICAL DATA:  76 year old male with a history of chest tube placement after pneumothorax EXAM: PORTABLE CHEST 1 VIEW COMPARISON:  05/19/2017 FINDINGS: Cardiomediastinal silhouette unchanged in size and contour. Calcifications of the aortic arch. Unchanged cardiac pacing device on the left chest wall with 2 leads in place. Interval placement of right-sided pigtail thoracostomy tube which terminates at the medial apex of the right lung. There has been re-expansion of the right lung with no visualized pneumothorax on the current study. Low lung volumes with coarsened interstitial markings. No displaced fracture IMPRESSION: Interval placement of right-sided pigtail thoracostomy tube, terminating at the right apex, with resolution of right  pneumothorax. Electronically Signed   By: Corrie Mckusick D.O.   On: 05/19/2017 15:59    Labs: Basic Metabolic Panel:  Last Labs    Recent Labs Lab 05/19/17 1327 05/20/17 0223  NA 136 137  K 3.9 4.2  CL 101 102  CO2 22 26  GLUCOSE 91 112*  BUN 6 8  CREATININE 0.94 0.90  CALCIUM 9.4 9.2     CBC:  Last Labs    Recent Labs Lab 05/19/17 1327 05/20/17 0223  WBC 8.0 7.5  HGB 12.1* 11.5*  HCT 35.5* 34.6*  MCV 100.6* 101.5*  PLT 215 199       Signed:  Barton Dubois MD.  [facility] 05/22/2017, 1:40 PM

## 2017-05-22 NOTE — Care Management Note (Signed)
Case Management Note  Patient Details  Name: [name] MRN: [id number] Date of Birth: 02/08/1941  Subjective/Objective:  Pt presented for SOB- Right Pneumothorax- s/p Chest Tube Removal. Plan for home 05-22-17. PTA pt was from home with family friend. PT recommendations for [facility] PT. Pt is agreeable to services.                 Action/Plan: Agency List Provided and Pt chose [facility] for home care. Referral made to [name] with [facility] and [facility] to begin within 24-48 hours post d/c. No further needs from CM at this time.   Expected Discharge Date:                  Expected Discharge Plan:  [place]  In-House Referral:  NA  Discharge planning Services  CM Consult  Post Acute Care Choice:  Home Health Choice offered to:  Patient  DME Arranged:  N/A DME Agency:  NA  HH Arranged:  PT HH Agency:  [facility]  Status of Service:  Completed, signed off  If discussed at [place] of Stay Meetings, dates discussed:    Additional Comments:  Bethena Roys, RN 05/22/2017, 12:46 PM

## 2017-05-22 NOTE — Progress Notes (Addendum)
Physical Therapy Treatment Patient Details Name: [name] MRN: [id number] DOB: 1941/08/17 Today's Date: 05/22/2017    History of Present Illness  76 y.o. 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  Patient with Rt pneumothorax.  Chest tube removed 05/21/17.    PT Comments    Pt doing well and ready for dc home with HHPT.    Follow Up Recommendations  Home health PT;Supervision for mobility/OOB     Equipment Recommendations  None recommended by PT (Pt has access to rolling walker)    Recommendations for Other Services       Precautions / Restrictions Precautions Precautions: None Restrictions Weight Bearing Restrictions: No    Mobility  Bed Mobility Overal bed mobility: Modified Independent                Transfers Overall transfer level: Modified independent Equipment used: Rolling walker (2 wheeled) Transfers: Sit to/from Stand Sit to Stand: Modified independent (Device/Increase time)            Ambulation/Gait Ambulation/Gait assistance: Supervision Ambulation Distance (Feet): 225 Feet Assistive device: Rolling walker (2 wheeled);None Gait Pattern/deviations: Step-through pattern;Decreased step length - right;Decreased step length - left;Decreased stride length;Trunk flexed;Wide base of support Gait velocity: decreased Gait velocity interpretation: Below normal speed for age/gender General Gait Details: Patient reports feeling slighlty unsteady but no loss of balance with use of rolling walker. Amb on RA with SpO2 >90% after amb.   Stairs            Wheelchair Mobility    Modified Rankin (Stroke Patients Only)       Balance Overall balance assessment: Needs assistance Sitting-balance support: No upper extremity supported Sitting balance-Leahy Scale: Normal     Standing balance support: No upper extremity supported Standing  balance-Leahy Scale: Fair                              Cognition Arousal/Alertness: Awake/alert Behavior During Therapy: WFL for tasks assessed/performed;Flat affect Overall Cognitive Status: Within Functional Limits for tasks assessed                                        Exercises      General Comments        Pertinent Vitals/Pain Pain Assessment: No/denies pain    Home Living                      Prior Function            PT Goals (current goals can now be found in the care plan section) Acute Rehab PT Goals PT Goal Formulation: With patient Time For Goal Achievement: 05/28/17 Potential to Achieve Goals: Good Progress towards PT goals: Goals met and updated - see care plan    Frequency    Min 3X/week      PT Plan      Co-evaluation              AM-PAC PT "6 Clicks" Daily Activity  Outcome Measure  Difficulty turning over in bed (including adjusting bedclothes, sheets and blankets)?: None Difficulty moving from lying on back to sitting on the side of the bed? : None Difficulty sitting down on and standing up from  a chair with arms (e.g., wheelchair, bedside commode, etc,.)?: None Help needed moving to and from a bed to chair (including a wheelchair)?: None Help needed walking in hospital room?: A Little Help needed climbing 3-5 steps with a railing? : A Little 6 Click Score: 22    End of Session   Activity Tolerance: Patient tolerated treatment well Patient left: in bed;with call bell/phone within reach Nurse Communication: Mobility status PT Visit Diagnosis: Unsteadiness on feet (R26.81);Muscle weakness (generalized) (M62.81)     Time: 3646-8032 PT Time Calculation (min) (ACUTE ONLY): 13 min  Charges:  $Gait Training: 8-22 mins                    G Codes:       [facility] PT [place] 05/22/2017, 11:45 AM

## 2017-05-23 ENCOUNTER — Telehealth: Payer: Self-pay | Admitting: *Deleted

## 2017-05-23 MED ORDER — MIDAZOLAM HCL 2 MG/2ML IJ SOLN
INTRAMUSCULAR | Status: AC
  Filled 2017-05-23: qty 2

## 2017-05-23 MED ORDER — FENTANYL CITRATE (PF) 100 MCG/2ML IJ SOLN
INTRAMUSCULAR | Status: AC
  Filled 2017-05-23: qty 2

## 2017-05-23 NOTE — Telephone Encounter (Signed)
Call from pt, he was discharged yesterday after injury at work. He missed lab appt 9/10 (myeloma labs). Asks if he should see Dr. Julien Nordmann as scheduled on 9/17- or should he have lab only that day and reschedule office? Message to collaborative RN to follow up.

## 2017-05-27 DIAGNOSIS — I10 Essential (primary) hypertension: Secondary | ICD-10-CM | POA: Diagnosis not present

## 2017-05-27 DIAGNOSIS — I2510 Atherosclerotic heart disease of native coronary artery without angina pectoris: Secondary | ICD-10-CM | POA: Diagnosis not present

## 2017-05-27 DIAGNOSIS — G4733 Obstructive sleep apnea (adult) (pediatric): Secondary | ICD-10-CM | POA: Diagnosis not present

## 2017-05-27 DIAGNOSIS — J939 Pneumothorax, unspecified: Secondary | ICD-10-CM | POA: Diagnosis not present

## 2017-05-27 DIAGNOSIS — M5430 Sciatica, unspecified side: Secondary | ICD-10-CM | POA: Diagnosis not present

## 2017-05-27 DIAGNOSIS — I442 Atrioventricular block, complete: Secondary | ICD-10-CM | POA: Diagnosis not present

## 2017-05-27 DIAGNOSIS — I4891 Unspecified atrial fibrillation: Secondary | ICD-10-CM | POA: Diagnosis not present

## 2017-05-27 DIAGNOSIS — I872 Venous insufficiency (chronic) (peripheral): Secondary | ICD-10-CM | POA: Diagnosis not present

## 2017-05-27 DIAGNOSIS — C9000 Multiple myeloma not having achieved remission: Secondary | ICD-10-CM | POA: Diagnosis not present

## 2017-05-27 DIAGNOSIS — D472 Monoclonal gammopathy: Secondary | ICD-10-CM | POA: Diagnosis not present

## 2017-05-29 ENCOUNTER — Ambulatory Visit: Payer: Medicare HMO | Admitting: Internal Medicine

## 2017-05-29 DIAGNOSIS — Z6830 Body mass index (BMI) 30.0-30.9, adult: Secondary | ICD-10-CM | POA: Diagnosis not present

## 2017-05-29 DIAGNOSIS — Z23 Encounter for immunization: Secondary | ICD-10-CM | POA: Diagnosis not present

## 2017-05-29 DIAGNOSIS — Z950 Presence of cardiac pacemaker: Secondary | ICD-10-CM | POA: Diagnosis not present

## 2017-05-29 DIAGNOSIS — J939 Pneumothorax, unspecified: Secondary | ICD-10-CM | POA: Diagnosis not present

## 2017-05-29 DIAGNOSIS — I10 Essential (primary) hypertension: Secondary | ICD-10-CM | POA: Diagnosis not present

## 2017-05-29 DIAGNOSIS — C9000 Multiple myeloma not having achieved remission: Secondary | ICD-10-CM | POA: Diagnosis not present

## 2017-05-29 DIAGNOSIS — I480 Paroxysmal atrial fibrillation: Secondary | ICD-10-CM | POA: Diagnosis not present

## 2017-05-29 DIAGNOSIS — I2510 Atherosclerotic heart disease of native coronary artery without angina pectoris: Secondary | ICD-10-CM | POA: Diagnosis not present

## 2017-05-30 ENCOUNTER — Other Ambulatory Visit: Payer: Self-pay | Admitting: Cardiovascular Disease

## 2017-06-01 ENCOUNTER — Ambulatory Visit (INDEPENDENT_AMBULATORY_CARE_PROVIDER_SITE_OTHER): Payer: Medicare HMO | Admitting: *Deleted

## 2017-06-01 DIAGNOSIS — I442 Atrioventricular block, complete: Secondary | ICD-10-CM | POA: Diagnosis not present

## 2017-06-01 NOTE — Progress Notes (Signed)
Remote pacemaker transmission.   

## 2017-06-02 ENCOUNTER — Encounter: Payer: Self-pay | Admitting: Cardiology

## 2017-06-05 ENCOUNTER — Other Ambulatory Visit: Payer: Self-pay | Admitting: Thoracic Surgery (Cardiothoracic Vascular Surgery)

## 2017-06-05 DIAGNOSIS — J939 Pneumothorax, unspecified: Secondary | ICD-10-CM

## 2017-06-05 LAB — CUP PACEART REMOTE DEVICE CHECK
Battery Voltage: 2.84 V
Brady Statistic AP VP Percent: 74 %
Brady Statistic AP VS Percent: 1 %
Brady Statistic AS VP Percent: 26 %
Brady Statistic RA Percent Paced: 68 %
Brady Statistic RV Percent Paced: 99 %
Implantable Lead Implant Date: 20111021
Implantable Lead Location: 753859
Lead Channel Impedance Value: 400 Ohm
Lead Channel Pacing Threshold Amplitude: 0.625 V
Lead Channel Pacing Threshold Pulse Width: 0.5 ms
Lead Channel Sensing Intrinsic Amplitude: 12 mV
Lead Channel Setting Pacing Amplitude: 1.625
Lead Channel Setting Sensing Sensitivity: 2 mV
MDC IDC LEAD IMPLANT DT: 20111021
MDC IDC LEAD LOCATION: 753860
MDC IDC MSMT BATTERY REMAINING LONGEVITY: 41 mo
MDC IDC MSMT BATTERY REMAINING PERCENTAGE: 40 %
MDC IDC MSMT LEADCHNL RA IMPEDANCE VALUE: 440 Ohm
MDC IDC MSMT LEADCHNL RA SENSING INTR AMPL: 1.9 mV
MDC IDC MSMT LEADCHNL RV PACING THRESHOLD AMPLITUDE: 1 V
MDC IDC MSMT LEADCHNL RV PACING THRESHOLD PULSEWIDTH: 0.5 ms
MDC IDC PG IMPLANT DT: 20111021
MDC IDC SESS DTM: 20180920090111
MDC IDC SET LEADCHNL RV PACING AMPLITUDE: 2.5 V
MDC IDC SET LEADCHNL RV PACING PULSEWIDTH: 0.5 ms
MDC IDC STAT BRADY AS VS PERCENT: 1 %
Pulse Gen Serial Number: 7163077

## 2017-06-06 ENCOUNTER — Other Ambulatory Visit: Payer: Medicare HMO

## 2017-06-06 ENCOUNTER — Ambulatory Visit (INDEPENDENT_AMBULATORY_CARE_PROVIDER_SITE_OTHER): Payer: Medicare HMO | Admitting: Thoracic Surgery (Cardiothoracic Vascular Surgery)

## 2017-06-06 ENCOUNTER — Encounter: Payer: Self-pay | Admitting: Thoracic Surgery (Cardiothoracic Vascular Surgery)

## 2017-06-06 ENCOUNTER — Other Ambulatory Visit (HOSPITAL_BASED_OUTPATIENT_CLINIC_OR_DEPARTMENT_OTHER): Payer: Medicare HMO

## 2017-06-06 ENCOUNTER — Ambulatory Visit
Admission: RE | Admit: 2017-06-06 | Discharge: 2017-06-06 | Disposition: A | Discharge status: Home / Self Care | Payer: Medicare HMO | Source: Ambulatory Visit | Attending: Thoracic Surgery (Cardiothoracic Vascular Surgery) | Admitting: Thoracic Surgery (Cardiothoracic Vascular Surgery)

## 2017-06-06 VITALS — BP 116/63 | HR 87 | Resp 19 | Ht 72.0 in | Wt 215.6 lb

## 2017-06-06 DIAGNOSIS — Z8579 Personal history of other malignant neoplasms of lymphoid, hematopoietic and related tissues: Secondary | ICD-10-CM | POA: Diagnosis not present

## 2017-06-06 DIAGNOSIS — J939 Pneumothorax, unspecified: Secondary | ICD-10-CM

## 2017-06-06 DIAGNOSIS — C9000 Multiple myeloma not having achieved remission: Secondary | ICD-10-CM | POA: Diagnosis not present

## 2017-06-06 LAB — CBC WITH DIFFERENTIAL/PLATELET
BASO%: 1.3 % (ref 0.0–2.0)
BASOS ABS: 0.1 10*3/uL (ref 0.0–0.1)
EOS ABS: 0.2 10*3/uL (ref 0.0–0.5)
EOS%: 2.9 % (ref 0.0–7.0)
HCT: 34.4 % — ABNORMAL LOW (ref 38.4–49.9)
HEMOGLOBIN: 12.5 g/dL — AB (ref 13.0–17.1)
LYMPH%: 22.9 % (ref 14.0–49.0)
MCH: 36.7 pg — AB (ref 27.2–33.4)
MCHC: 36.4 g/dL — AB (ref 32.0–36.0)
MCV: 100.7 fL — AB (ref 79.3–98.0)
MONO#: 0.6 10*3/uL (ref 0.1–0.9)
MONO%: 8.7 % (ref 0.0–14.0)
NEUT#: 4.3 10*3/uL (ref 1.5–6.5)
NEUT%: 64.2 % (ref 39.0–75.0)
Platelets: 266 10*3/uL (ref 140–400)
RBC: 3.42 10*6/uL — ABNORMAL LOW (ref 4.20–5.82)
RDW: 12.1 % (ref 11.0–14.6)
WBC: 6.7 10*3/uL (ref 4.0–10.3)
lymph#: 1.5 10*3/uL (ref 0.9–3.3)

## 2017-06-06 LAB — COMPREHENSIVE METABOLIC PANEL
ALBUMIN: 3.2 g/dL — AB (ref 3.5–5.0)
ALK PHOS: 61 U/L (ref 40–150)
ALT: 23 U/L (ref 0–55)
AST: 36 U/L — AB (ref 5–34)
Anion Gap: 9 mEq/L (ref 3–11)
BUN: 5.6 mg/dL — AB (ref 7.0–26.0)
CHLORIDE: 104 meq/L (ref 98–109)
CO2: 24 mEq/L (ref 22–29)
Calcium: 9.1 mg/dL (ref 8.4–10.4)
Creatinine: 0.8 mg/dL (ref 0.7–1.3)
EGFR: 87 mL/min/{1.73_m2} — AB (ref >90)
GLUCOSE: 95 mg/dL (ref 70–140)
POTASSIUM: 4 meq/L (ref 3.5–5.1)
SODIUM: 136 meq/L (ref 136–145)
Total Bilirubin: 0.46 mg/dL (ref 0.20–1.20)
Total Protein: 8.1 g/dL (ref 6.4–8.3)

## 2017-06-06 LAB — LACTATE DEHYDROGENASE: LDH: 212 U/L (ref 125–245)

## 2017-06-06 NOTE — Progress Notes (Signed)
[address]       [place] [address]             [phone]     HPI: Mr. Cornforth returns for follow-up after recently being hospitalized with a right pneumothorax.  Mr. Depierro is a 76 year old gentleman with a past history of coronary disease, atrial flutter, heart block, hyperlipidemia, hypertension, multiple lumbar, and obstructive sleep apnea. He had an accident on his riding mower 2 days prior to admission. On the day of admission, 05/19/2017, he woke up short of breath and having right-sided chest pain. In the emergency room he was found to have a large right pneumothorax and the emergency room physician placed a chest tube. He did not have any air leak afterwards and his tube was removed a couple days later. He was discharged on 05/22/2017.  It is unclear whether this was related to his trauma or spontaneous pneumothorax.  He does have a history of smoking but quit in 2006.  He still has some occasional pains in the right chest. He has not had any new respiratory symptoms. Past Medical History:  Diagnosis Date  . Atrial flutter ([place])    A. s/p prior ablation;  B.  s/p CTI ablation 10/24/10 (Dr. Caryl Comes)  . CAD (coronary artery disease)    A.  s/p CFX in the past;   B.  cath 06/2010: LAD 30-40%, D1 50%, OM1 50%, AVCFX stent 30%, dCFX 70-80% (med Rx), mRCA 40%;      C.  Echo 06/2010: EF 60-65%, mod LVH; mild LAE     . Cubital tunnel syndrome   . Degenerative disc disease   . Heart block   . Hx of adenomatous colonic polyps   . Hyperlipidemia   . Hypertension   . Multiple myeloma   . Sleep apnea   . 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 Venous insufficiency      Current Outpatient Prescriptions  Medication Sig Dispense Refill  . acetaminophen (TYLENOL) 325 MG tablet Take 2 tablets (650 mg total) by mouth every 6 (six) hours as needed for mild pain. 40 tablet 0  . atenolol (TENORMIN) 50 MG tablet TAKE ONE TABLET BY MOUTH  ONCE DAILY 90 tablet 1  . atorvastatin (LIPITOR) 10 MG tablet Take 10 mg by mouth daily.    Marland Kitchen EPINEPHrine 0.3 mg/0.3 mL IJ SOAJ injection Inject 0.3 mg into the muscle once as needed for anaphylaxis.    . fluorouracil (EFUDEX) 5 % cream Apply 1 application topically daily as needed (for skin cancer treatment). APPLY TO AFFECTED AREAS OF SCALP    . Glucosamine HCl 1000 MG TABS Take 2 tablets by mouth daily.    Marland Kitchen ketotifen (ALAWAY) 0.025 % ophthalmic solution Place 1 drop into both eyes 2 (two) times daily.    Marland Kitchen losartan (COZAAR) 100 MG tablet Take 100 mg by mouth daily.    . Multiple Vitamin (MULTIVITAMIN) capsule Take 1 capsule by mouth daily.      . nitroGLYCERIN (NITROSTAT) 0.4 MG SL tablet Place 1 tablet (0.4 mg total) under the tongue every 5 (five) minutes as needed for chest pain. Reported on 12/07/2015 25 tablet 3  . NON FORMULARY CPAP Machine at night    . Omega-3 Fatty Acids (FISH OIL) 1200 MG CAPS Take 1 capsule by mouth daily.      . rivaroxaban (XARELTO) 20 MG TABS tablet Take 1 tablet (20 mg total) by  mouth daily with supper. 90 tablet 3  . thiamine 100 MG tablet Take 100 mg by mouth daily.     No current facility-administered medications for this visit.     Physical Exam BP 116/63 (BP Location: Right Arm, Cuff Size: Normal)   Pulse 87   Resp 19   Ht 6' (1.829 m)   Wt 215 lb 9.6 oz (97.8 kg)   SpO2 99% Comment: on RA  BMI 29.27 kg/m  76 year old man in no acute distress Chest tube site well healed Lungs clear with equal breath sounds bilaterally  Diagnostic Tests: CHEST  2 VIEW  COMPARISON:  Radiographs of May 22, 2017.  FINDINGS: The heart size and mediastinal contours are within normal limits. Atherosclerosis of thoracic aorta is noted. Left-sided pacemaker is unchanged in position. No pneumothorax or pleural effusion is noted. No acute pulmonary disease is noted. The visualized skeletal structures are unremarkable.  IMPRESSION: No active  cardiopulmonary disease.  Aortic atherosclerosis.   Electronically Signed   By: Marijo Conception, M.D.   On: 06/06/2017 12:40   Impression: 76 year old man who was admitted with a right pneumothorax in early September. He had had an accident on his riding lawnmower 2 days prior to admission where the lawnmowet rolled on top of him. He didn't develop shortness of breath for about 36 hours. My suspicion is this was a spontaneous pneumothorax, but can't rule out a traumatic component.  In either event, the pneumo resolved with chest tube placement and he did not have a significant air leak.  At this point his activities are unrestricted although I did caution him to build into new activities gradually.  He understands that if this were to recur he would need surgical intervention.  Plan: Return when necessary  Melrose Nakayama, MD [facility] [phone]

## 2017-06-07 LAB — IGG, IGA, IGM
IgA, Qn, Serum: 99 mg/dL (ref 61–437)
IgG, Qn, Serum: 377 mg/dL — ABNORMAL LOW (ref 700–1600)

## 2017-06-07 LAB — BETA 2 MICROGLOBULIN, SERUM: Beta-2: 1.5 mg/L (ref 0.6–2.4)

## 2017-06-07 LAB — KAPPA/LAMBDA LIGHT CHAINS
IG KAPPA FREE LIGHT CHAIN: 12.5 mg/L (ref 3.3–19.4)
IG LAMBDA FREE LIGHT CHAIN: 77.3 mg/L — AB (ref 5.7–26.3)
Kappa/Lambda FluidC Ratio: 0.16 — ABNORMAL LOW (ref 0.26–1.65)

## 2017-06-13 ENCOUNTER — Ambulatory Visit (HOSPITAL_BASED_OUTPATIENT_CLINIC_OR_DEPARTMENT_OTHER): Payer: Medicare HMO | Admitting: Oncology

## 2017-06-13 ENCOUNTER — Telehealth: Payer: Self-pay | Admitting: Oncology

## 2017-06-13 ENCOUNTER — Encounter: Payer: Self-pay | Admitting: Oncology

## 2017-06-13 ENCOUNTER — Ambulatory Visit: Payer: Medicare HMO | Admitting: Oncology

## 2017-06-13 VITALS — BP 126/66 | HR 77 | Temp 98.5°F | Resp 18 | Ht 72.0 in | Wt 216.9 lb

## 2017-06-13 DIAGNOSIS — I10 Essential (primary) hypertension: Secondary | ICD-10-CM | POA: Diagnosis not present

## 2017-06-13 DIAGNOSIS — J939 Pneumothorax, unspecified: Secondary | ICD-10-CM | POA: Diagnosis not present

## 2017-06-13 DIAGNOSIS — Z7901 Long term (current) use of anticoagulants: Secondary | ICD-10-CM

## 2017-06-13 DIAGNOSIS — C9000 Multiple myeloma not having achieved remission: Secondary | ICD-10-CM

## 2017-06-13 DIAGNOSIS — I2510 Atherosclerotic heart disease of native coronary artery without angina pectoris: Secondary | ICD-10-CM | POA: Diagnosis not present

## 2017-06-13 DIAGNOSIS — I4891 Unspecified atrial fibrillation: Secondary | ICD-10-CM | POA: Diagnosis not present

## 2017-06-13 NOTE — Telephone Encounter (Signed)
Scheduled appt per 10/2 los - Gave patient AVS and calender per los.  

## 2017-06-13 NOTE — Progress Notes (Signed)
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   PRIOR THERAPY: None  CURRENT THERAPY: Observation  INTERVAL HISTORY: [name] 76 y.o. male returns for routine follow-up by himself. The patient is feeling fine with no specific complaints. Last month the patient was admitted to the hospital for pneumothorax. The patient had an injury when his lawnmower flipped over and injured his chest. He had a chest tube placed and he was hospitalized for several days. He has recovered from this incident. He denied having any chest pain, shortness of breath, cough or hemoptysis. He has no fever or chills. He has no nausea or vomiting. He remains on Xarelto for atrial fibrillation. He denied having any recent weight loss or night sweats. He had repeat myeloma panel performed recently and he is here for evaluation and discussion of his lab results.   Patient Active Problem List   Diagnosis Date Noted  . Pneumothorax on right 05/19/2017  . Alcohol intoxication in active alcoholic ([place]) 54/56/2563  . Right-sided muscle weakness 04/28/2013  . 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 Coronary artery disease-nonobstructive catheter in October 2011 12/27/2010  . PACEMAKER, St. Jude dual-chamber 09/14/2010  . Multiple myeloma ([place]) 07/21/2010  . SMOKER 07/21/2010  . Obstructive sleep apnea 06/12/2009  . Atrioventricular block, heart high-grade 01/29/2009  . SICK SINUS/ TACHY-BRADY SYNDROME 01/29/2009  . Atrial tachycardia/fibrillation 01/14/2009  . SCIATICA 01/14/2009  . Essential hypertension 01/03/2009  . VENOUS INSUFFICIENCY 01/03/2009  . ALLERGY 01/03/2009    is allergic to bee venom.  MEDICAL HISTORY: Past Medical History:  Diagnosis Date  . Atrial flutter ([place])    A. s/p prior  ablation;  B.  s/p CTI ablation 10/24/10 (Dr. Caryl Comes)  . CAD (coronary artery disease)    A.  s/p CFX in the past;   B.  cath 06/2010: LAD 30-40%, D1 50%, OM1 50%, AVCFX stent 30%, dCFX 70-80% (med Rx), mRCA 40%;      C.  Echo 06/2010: EF 60-65%, mod LVH; mild LAE     . Cubital tunnel syndrome   . Degenerative disc disease   . Heart block   . Hx of adenomatous colonic polyps   . Hyperlipidemia   . Hypertension   . Multiple myeloma   . Sleep apnea   . 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 Venous insufficiency     SURGICAL HISTORY: Past Surgical History:  Procedure Laterality Date  . COLONOSCOPY  10-07-2009   TA polyp, tics, hems   . CORONARY ANGIOPLASTY WITH STENT PLACEMENT  2005  . PACEMAKER PLACEMENT  2011  . POLYPECTOMY    . Right olecranon nerve surgery    . VENTRAL HERNIA REPAIR      SOCIAL HISTORY: Social History   Social History  . Marital status: Divorced    Spouse name: N/A  . Number of children: N/A  . Years of education: N/A   Occupational History  . Retired from [employer] Retired  . still farms part time    Social History Main Topics  . Smoking status: Former Smoker    Packs/day: 1.00    Years: 49.00    Types: Cigarettes    Quit date: 09/12/2004  . Smokeless tobacco: Former Systems developer     Comment: started at age  15; smoked 1 ppd; quit in 2006  . Alcohol use 16.8 oz/week    28 Standard drinks or equivalent per week     Comment: occasional  . Drug use: No  . Sexual activity: Not Currently   Other Topics Concern  . Not on file   Social History Narrative  . No narrative on file    FAMILY HISTORY: Family History  Problem Relation Age of Onset  . Heart disease Father   . Cancer Father        prostate  . Heart disease Brother   . Colon cancer Neg Hx     Review of Systems  Constitutional: Negative.   HENT:  Negative.   Eyes: Negative.   Respiratory: Negative.   Cardiovascular: Negative.   Gastrointestinal:  Negative.   Endocrine: Negative.   Genitourinary: Negative.    Musculoskeletal: Negative.   Skin: Negative.   Neurological: Negative.   Hematological: Negative.   Psychiatric/Behavioral: Negative.       PHYSICAL EXAMINATION  ECOG PERFORMANCE STATUS: 1 - Symptomatic but completely ambulatory  Vitals:   06/13/17 1024  BP: 126/66  Pulse: 77  Resp: 18  Temp: 98.5 F (36.9 C)  SpO2: 99%    Physical Exam  Constitutional: He is oriented to person, place, and time and well-developed, well-nourished, and in no distress. No distress.  HENT:  Head: Normocephalic.  Mouth/Throat: Oropharynx is clear and moist. No oropharyngeal exudate.  Eyes: Conjunctivae are normal. Right eye exhibits no discharge. Left eye exhibits no discharge. No scleral icterus.  Neck: Normal range of motion. Neck supple.  Cardiovascular: Normal rate, regular rhythm, normal heart sounds and intact distal pulses.   Pulmonary/Chest: Effort normal and breath sounds normal. No respiratory distress. He has no wheezes. He has no rales.  Abdominal: Soft. Bowel sounds are normal. He exhibits no distension and no mass. There is no tenderness.  Musculoskeletal: Normal range of motion. He exhibits no edema.  Lymphadenopathy:    He has no cervical adenopathy.  Neurological: He is alert and oriented to person, place, and time. He exhibits normal muscle tone. Gait normal.  Skin: Skin is warm and dry. No rash noted. He is not diaphoretic. No erythema. No pallor.  Psychiatric: Mood, memory, affect and judgment normal.  Vitals reviewed.   LABORATORY DATA:  CBC    Component Value Date/Time   WBC 6.7 06/06/2017 1058   WBC 7.5 05/20/2017 0223   RBC 3.42 (L) 06/06/2017 1058   RBC 3.41 (L) 05/20/2017 0223   HGB 12.5 (L) 06/06/2017 1058   HCT 34.4 (L) 06/06/2017 1058   PLT 266 06/06/2017 1058   MCV 100.7 (H) 06/06/2017 1058   MCH 36.7 (H) 06/06/2017 1058   MCH 33.7 05/20/2017 0223   MCHC 36.4 (H) 06/06/2017 1058   MCHC  33.2 05/20/2017 0223   RDW 12.1 06/06/2017 1058   LYMPHSABS 1.5 06/06/2017 1058   MONOABS 0.6 06/06/2017 1058   EOSABS 0.2 06/06/2017 1058   BASOSABS 0.1 06/06/2017 1058    CMP     Component Value Date/Time   NA 136 06/06/2017 1058   K 4.0 06/06/2017 1058   CL 102 05/20/2017 0223   CL 97 (L) 12/03/2012 1015   CO2 24 06/06/2017 1058   GLUCOSE 95 06/06/2017 1058   GLUCOSE 118 (H) 12/03/2012 1015   BUN 5.6 (L) 06/06/2017 1058   CREATININE 0.8 06/06/2017 1058   CALCIUM 9.1 06/06/2017 1058   PROT 8.1 06/06/2017 1058   ALBUMIN 3.2 (L) 06/06/2017 1058  AST 36 (H) 06/06/2017 1058   ALT 23 06/06/2017 1058   ALKPHOS 61 06/06/2017 1058   BILITOT 0.46 06/06/2017 1058   GFRNONAA >60 05/20/2017 0223   GFRAA >60 05/20/2017 0223    RADIOGRAPHIC STUDIES:  No results found.  ASSESSMENT and THERAPY PLAN:   Multiple myeloma ([place]) This is a very pleasant 76 year old white male with history of smoldering multiple myeloma diagnosed in August 2011 and has been observation since that time.  The patient was seen with Dr. Julien Nordmann. Explained to the patient that he continues to do well in that his myeloma panel showed no clear evidence for disease progression except for mild increase of the free lambda light chain with a decrease in the IgM. Recommended for the patient to continue on observation for now. I would see him back for follow-up visit in 6 months with repeat myeloma panel. He was advised to call immediately if he has any concerning symptoms in the interval. The patient voices understanding of current disease status and treatment options and is in agreement with the current care plan.  All questions were answered. The patient knows to call the clinic with any problems, questions or concerns. We can certainly see the patient much sooner if necessary. I spent 10 minutes counseling the patient face to face.    Orders Placed This Encounter  Procedures  . CBC with Differential/Platelet     Standing Status:   Future    Standing Expiration Date:   06/13/2018  . Comprehensive metabolic panel    Standing Status:   Future    Standing Expiration Date:   06/13/2018  . Lactate dehydrogenase    Standing Status:   Future    Standing Expiration Date:   06/13/2018  . Kappa/lambda light chains    Standing Status:   Future    Standing Expiration Date:   06/13/2018  . QIG  (Quant. immunoglobulins  - IgG, IgA, IgM)    Standing Status:   Future    Standing Expiration Date:   06/13/2018  . Beta 2 microglobulin    Standing Status:   Future    Standing Expiration Date:   06/13/2018    All questions were answered. The patient knows to call the clinic with any problems, questions or concerns. We can certainly see the patient much sooner if necessary.  Mikey Bussing, NP 06/13/2017   ADDENDUM: Hematology/Oncology Attending: I had a face to face encounter with the patient today. I recommended his care plan. This is a very pleasant 76 years old white male with history of smoldering multiple myeloma diagnosed in August 2011 has been observation since that time. He had recent myeloma panel that showed no evidence for disease progression. I discussed the lab result with the patient and recommended for him to continue on observation with repeat myeloma panel in 6 months. The patient is currently asymptomatic and he agreed to the current plan. He was advised to call immediately if he has any concerning symptoms in the interval.  Disclaimer: This note was dictated with voice recognition software. Similar sounding words can inadvertently be transcribed and may be missed upon review. Eilleen Kempf, MD 06/14/17

## 2017-06-13 NOTE — Assessment & Plan Note (Signed)
This is a very pleasant 76 year old white male with history of smoldering multiple myeloma diagnosed in August 2011 and has been observation since that time.  The patient was seen with Dr. Julien Nordmann. Explained to the patient that he continues to do well in that his myeloma panel showed no clear evidence for disease progression except for mild increase of the free lambda light chain with a decrease in the IgM. Recommended for the patient to continue on observation for now. I would see him back for follow-up visit in 6 months with repeat myeloma panel. He was advised to call immediately if he has any concerning symptoms in the interval. The patient voices understanding of current disease status and treatment options and is in agreement with the current care plan.  All questions were answered. The patient knows to call the clinic with any problems, questions or concerns. We can certainly see the patient much sooner if necessary. I spent 10 minutes counseling the patient face to face.

## 2017-06-16 DIAGNOSIS — J939 Pneumothorax, unspecified: Secondary | ICD-10-CM | POA: Diagnosis not present

## 2017-06-16 DIAGNOSIS — C9000 Multiple myeloma not having achieved remission: Secondary | ICD-10-CM | POA: Diagnosis not present

## 2017-06-16 DIAGNOSIS — I4891 Unspecified atrial fibrillation: Secondary | ICD-10-CM | POA: Diagnosis not present

## 2017-06-16 DIAGNOSIS — I10 Essential (primary) hypertension: Secondary | ICD-10-CM | POA: Diagnosis not present

## 2017-06-16 DIAGNOSIS — I2510 Atherosclerotic heart disease of native coronary artery without angina pectoris: Secondary | ICD-10-CM | POA: Diagnosis not present

## 2017-07-02 DIAGNOSIS — G4733 Obstructive sleep apnea (adult) (pediatric): Secondary | ICD-10-CM | POA: Diagnosis not present

## 2017-07-03 ENCOUNTER — Telehealth: Payer: Self-pay

## 2017-07-03 NOTE — Telephone Encounter (Signed)
I spoke with pt. He cannot afford Xarelto.  Has 6 days left.  I told pt I would leave samples of Xarelto at front desk for him to pick up and forward information to our PA nurse.  2 week supply of Xarelto left at front desk.

## 2017-07-03 NOTE — Telephone Encounter (Signed)
Pt was blind transferred to [facility], pt states his Xarelto rx copay has increased from $100/90 day supply to over $400/90 day supply, pt states he is unable to afford this rx.  Pt wants to know if he can be switched to another medication that is more affordable.  Please advise.  Thanks

## 2017-07-04 NOTE — Telephone Encounter (Signed)
LMTCB

## 2017-07-05 NOTE — Telephone Encounter (Signed)
**Note De-Identified  Obfuscation** The pt is not sure if he is in the "donut hole" or not but wants to switch to another medication that is less expensive. I explained to him that if he is in the donut hole we can apply for pt assistance and that if he is approved he will receive his Xarelto free of charge from the manufacturer and that if he is not in the donut hole then I can attempt to get a tier exception which will lower the cost of his Xarelto. He states that he is going to call [employer] to find out if he is in the donut hole and if Pradaxa or Eliquis is less expensive than Xarelto. He states that he will call me back and let me know what he wants to do.

## 2017-07-05 NOTE — Telephone Encounter (Signed)
The pt called me back after speaking to [facility] and stated that they advised him that he is in the donut hole and that Pradaxa and Eliquis are going to cost him about the same amount as Xarelto. He asked what his options are at this point and I advised him that he can do one of the following: 1. Apply for Pt assistance and if approved he will receive his Xarelto free of charge until he comes out of the donut hole. 2. Switch to Warfarin or 3. Pay out of pocket for his Xarelto.  He stated that he does not want to apply for pts assistance because he does not want to reveal his financial situation/information and he cannot afford to pay for his Xarelto out of pocket. He is interested in going back on Warfarin until he is out of the donut hole as he states that he took Warfarin years ago without any problems.  He is advised that I am sending this message to Dr Caryl Comes for his advisement.

## 2017-07-05 NOTE — Telephone Encounter (Signed)
No problem if that is his desire

## 2017-07-05 NOTE — Telephone Encounter (Signed)
New Message    Pt is returning call about Xarelto

## 2017-07-06 ENCOUNTER — Telehealth: Payer: Self-pay | Admitting: Cardiovascular Disease

## 2017-07-06 NOTE — Telephone Encounter (Signed)
Follow Up Call   [name] is calling because he has come up with enough money to pay the copay for his Xarelto , and will speak with the Doctor on his visit on 09/01/17 . He does not want you to go through the trouble of trying to get him the Warfarin.  Please call if you have any questions . Thanks

## 2017-07-06 NOTE — Telephone Encounter (Signed)
°  New Prob  Pt is requesting to speak to [place] regarding his Xarelto medication. Please call.

## 2017-07-07 NOTE — Telephone Encounter (Signed)
**Note De-Identified  Obfuscation** See phone note from 07/06/17. The pt has decided to stay on Xarelto as he is going to try to pay for it out of pocket until he is out of the donut hole. The pt states that he will discuss with Dr Caryl Comes at his f/u on 09/01/17.

## 2017-07-07 NOTE — Telephone Encounter (Signed)
**Note De-Identified  Obfuscation** Noted. Xarelto has been left on his med list.

## 2017-07-19 DIAGNOSIS — R69 Illness, unspecified: Secondary | ICD-10-CM | POA: Diagnosis not present

## 2017-08-31 ENCOUNTER — Ambulatory Visit (INDEPENDENT_AMBULATORY_CARE_PROVIDER_SITE_OTHER): Payer: Medicare HMO | Admitting: *Deleted

## 2017-08-31 DIAGNOSIS — I442 Atrioventricular block, complete: Secondary | ICD-10-CM

## 2017-08-31 NOTE — Progress Notes (Signed)
Remote pacemaker transmission.   

## 2017-08-31 NOTE — Progress Notes (Signed)
Electrophysiology Office Note Date: 09/01/2017  ID:  [name], [name] Apr 10, 1941, MRN [id number]  PCP: [name], MD Primary Cardiologist: Johnsie Cancel Electrophysiologist: Caryl Comes  CC: Pacemaker follow-up  [name] is a 76 y.o. male seen today for Dr Caryl Comes.  He presents today for routine electrophysiology followup.  Since last being seen in our clinic, the patient reports doing reasonably well.  His biggest concern is with arthritic pain in knees.  He denies chest pain, palpitations, dyspnea, PND, orthopnea, nausea, vomiting, dizziness, syncope, edema, weight gain, or early satiety.  Device History: STJ dual chamber PPM implanted 2011 for complete heart block    Past Medical History:  Diagnosis Date  . Atrial flutter ([place])    A. s/p prior ablation;  B.  s/p CTI ablation 10/24/10 (Dr. Caryl Comes)  . CAD (coronary artery disease)    A.  s/p CFX in the past;   B.  cath 06/2010: LAD 30-40%, D1 50%, OM1 50%, AVCFX stent 30%, dCFX 70-80% (med Rx), mRCA 40%;      C.  Echo 06/2010: EF 60-65%, mod LVH; mild LAE     . Cubital tunnel syndrome   . Degenerative disc disease   . Heart block   . Hx of adenomatous colonic polyps   . Hyperlipidemia   . Hypertension   . Multiple myeloma   . Sleep apnea   . 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 Venous insufficiency    Past Surgical History:  Procedure Laterality Date  . COLONOSCOPY  10-07-2009   TA polyp, tics, hems   . CORONARY ANGIOPLASTY WITH STENT PLACEMENT  2005  . PACEMAKER PLACEMENT  2011  . POLYPECTOMY    . Right olecranon nerve surgery    . VENTRAL HERNIA REPAIR      Current Outpatient Medications  Medication Sig Dispense Refill  . acetaminophen (TYLENOL) 325 MG tablet Take 2 tablets (650 mg total) by mouth every 6 (six) hours as needed for mild pain. 40 tablet 0  . atenolol (TENORMIN) 50 MG tablet TAKE ONE TABLET BY MOUTH ONCE DAILY 90 tablet 1  . atorvastatin (LIPITOR) 10 MG tablet Take  10 mg by mouth daily.    Marland Kitchen EPINEPHrine 0.3 mg/0.3 mL IJ SOAJ injection Inject 0.3 mg into the muscle once as needed for anaphylaxis.    . fluorouracil (EFUDEX) 5 % cream Apply 1 application topically daily as needed (for skin cancer treatment). APPLY TO AFFECTED AREAS OF SCALP    . Glucosamine HCl 1000 MG TABS Take 2 tablets by mouth daily.    Marland Kitchen ketotifen (ALAWAY) 0.025 % ophthalmic solution Place 1 drop into both eyes 2 (two) times daily.    Marland Kitchen losartan (COZAAR) 100 MG tablet Take 100 mg by mouth daily.    . Multiple Vitamin (MULTIVITAMIN) capsule Take 1 capsule by mouth daily.      . nitroGLYCERIN (NITROSTAT) 0.4 MG SL tablet Place 1 tablet (0.4 mg total) under the tongue every 5 (five) minutes as needed for chest pain. Reported on 12/07/2015 25 tablet 3  . NON FORMULARY CPAP Machine at night    . Omega-3 Fatty Acids (FISH OIL) 1200 MG CAPS Take 1 capsule by mouth daily.      . rivaroxaban (XARELTO) 20 MG TABS tablet Take 1 tablet (20 mg total) by mouth daily with supper. 90 tablet 3  . thiamine 100 MG tablet Take 100 mg by mouth daily.     No current facility-administered  medications for this visit.     Allergies:   Bee venom   Social History: Social History   Socioeconomic History  . Marital status: Divorced    Spouse name: Not on file  . Number of children: Not on file  . Years of education: Not on file  . Highest education level: Not on file  Social Needs  . Financial resource strain: Not on file  . Food insecurity - worry: Not on file  . Food insecurity - inability: Not on file  . Transportation needs - medical: Not on file  . Transportation needs - non-medical: Not on file  Occupational History  . Occupation: Retired from Retail banker: RETIRED  . Occupation: still farms part time  Tobacco Use  . Smoking status: Former Smoker    Packs/day: 1.00    Years: 49.00    Pack years: 49.00    Types: Cigarettes    Last attempt to quit: 09/12/2004    Years since  quitting: 12.9  . Smokeless tobacco: Former Systems developer  . Tobacco comment: started at age 65; smoked 1 ppd; quit in 2006  Substance and Sexual Activity  . Alcohol use: Yes    Alcohol/week: 16.8 oz    Types: 28 Standard drinks or equivalent per week    Comment: occasional  . Drug use: No  . Sexual activity: Not Currently  Other Topics Concern  . Not on file  Social History Narrative  . Not on file    Family History: Family History  Problem Relation Age of Onset  . Heart disease Father   . Cancer Father        prostate  . Heart disease Brother   . Colon cancer Neg Hx      Review of Systems: All other systems reviewed and are otherwise negative except as noted above.   Physical Exam: VS:  BP 114/72   Pulse 62   Ht 6' (1.829 m)   Wt 216 lb (98 kg)   BMI 29.29 kg/m  , BMI Body mass index is 29.29 kg/m.  GEN- The patient is elderly appearing, alert and oriented x 3 today.   HEENT: normocephalic, atraumatic; sclera clear, conjunctiva pink; hearing intact; oropharynx clear; neck supple  Lungs- Clear to ausculation bilaterally, normal work of breathing.  No wheezes, rales, rhonchi Heart- Regular rate and rhythm, no murmurs, rubs or gallops  GI- soft, non-tender, non-distended, bowel sounds present  Extremities- no clubbing, cyanosis, or edema  MS- no significant deformity or atrophy Skin- warm and dry, no rash or lesion; PPM pocket well healed Psych- euthymic mood, full affect Neuro- strength and sensation are intact  PPM Interrogation- reviewed in detail today,  See PACEART report  EKG:  EKG is not ordered today.  Recent Labs: 06/06/2017: ALT 23; BUN 5.6; Creatinine 0.8; HGB 12.5; Platelets 266; Potassium 4.0; Sodium 136   Wt Readings from Last 3 Encounters:  09/01/17 216 lb (98 kg)  06/13/17 216 lb 14.4 oz (98.4 kg)  06/06/17 215 lb 9.6 oz (97.8 kg)     Other studies Reviewed: Additional studies/ records that were reviewed today include: Dr Johnsie Cancel and Dr Olin Pia  office notes  Assessment and Plan:  1.  Complete heart block Normal PPM function See Pace Art report No changes today  2.  Paroxysmal atrial fibrillation Burden by device interrogation 12% - asymptomatic  V rates controlled Continue anticoagulation for CHADS2VASC of 4 Recent CBC, BMET reviewed   3.  HTN Stable No  change required today    Current medicines are reviewed at length with the patient today.   The patient does not have concerns regarding his medicines.  The following changes were made today:  none  Labs/ tests ordered today include: none No orders of the defined types were placed in this encounter.    Disposition:   Follow up with Delilah Shan, Dr Caryl Comes 1 year    Signed, Chanetta Marshall, NP 09/01/2017 9:48 AM  [place] [address] [phone] (office) [phone] (fax)

## 2017-09-01 ENCOUNTER — Ambulatory Visit: Payer: Medicare HMO | Admitting: Nurse Practitioner

## 2017-09-01 ENCOUNTER — Encounter: Payer: Self-pay | Admitting: Nurse Practitioner

## 2017-09-01 ENCOUNTER — Encounter: Payer: Self-pay | Admitting: Cardiology

## 2017-09-01 VITALS — BP 114/72 | HR 62 | Ht 72.0 in | Wt 216.0 lb

## 2017-09-01 DIAGNOSIS — I442 Atrioventricular block, complete: Secondary | ICD-10-CM

## 2017-09-01 DIAGNOSIS — I10 Essential (primary) hypertension: Secondary | ICD-10-CM

## 2017-09-01 DIAGNOSIS — I480 Paroxysmal atrial fibrillation: Secondary | ICD-10-CM

## 2017-09-01 LAB — CUP PACEART INCLINIC DEVICE CHECK
Date Time Interrogation Session: 20181221113021
Implantable Lead Implant Date: 20111021
Implantable Lead Location: 753859
Implantable Lead Location: 753860
MDC IDC LEAD IMPLANT DT: 20111021
MDC IDC PG IMPLANT DT: 20111021
MDC IDC PG SERIAL: 7163077
Pulse Gen Model: 2110

## 2017-09-01 NOTE — Patient Instructions (Addendum)
Medication Instructions:   Your physician recommends that you continue on your current medications as directed. Please refer to the Current Medication list given to you today.   If you need a refill on your cardiac medications before your next appointment, please call your pharmacy.  Labwork:   NONE ORDERED  TODAY     Testing/Procedures: NONE ORDERED  TODAY    Follow-Up: Your physician wants you to follow-up in: ONE YEAR WITH  KLEIN  You will receive a reminder letter in the mail two months in advance. If you don't receive a letter, please call our office to schedule the follow-up appointment.   Remote monitoring is used to monitor your Pacemaker of ICD from home. This monitoring reduces the number of office visits required to check your device to one time per year. It allows us to keep an eye on the functioning of your device to ensure it is working properly. You are scheduled for a device check from home on . 11-30-17 You may send your transmission at any time that day. If you have a wireless device, the transmission will be sent automatically. After your physician reviews your transmission, you will receive a postcard with your next transmission date.    Any Other Special Instructions Will Be Listed Below (If Applicable).                                                                                                                                                   

## 2017-09-13 LAB — CUP PACEART REMOTE DEVICE CHECK
Battery Remaining Percentage: 35 %
Battery Voltage: 2.83 V
Brady Statistic RV Percent Paced: 99 %
Date Time Interrogation Session: 20181220132554
Implantable Lead Implant Date: 20111021
Implantable Lead Implant Date: 20111021
Implantable Lead Location: 753860
Implantable Pulse Generator Implant Date: 20111021
Lead Channel Pacing Threshold Amplitude: 0.5 V
Lead Channel Pacing Threshold Amplitude: 1 V
Lead Channel Sensing Intrinsic Amplitude: 3.5 mV
Lead Channel Setting Pacing Pulse Width: 0.5 ms
Lead Channel Setting Sensing Sensitivity: 2 mV
MDC IDC LEAD LOCATION: 753859
MDC IDC MSMT BATTERY REMAINING LONGEVITY: 37 mo
MDC IDC MSMT LEADCHNL RA IMPEDANCE VALUE: 430 Ohm
MDC IDC MSMT LEADCHNL RA PACING THRESHOLD PULSEWIDTH: 0.5 ms
MDC IDC MSMT LEADCHNL RV IMPEDANCE VALUE: 440 Ohm
MDC IDC MSMT LEADCHNL RV PACING THRESHOLD PULSEWIDTH: 0.5 ms
MDC IDC MSMT LEADCHNL RV SENSING INTR AMPL: 12 mV
MDC IDC SET LEADCHNL RA PACING AMPLITUDE: 1.5 V
MDC IDC SET LEADCHNL RV PACING AMPLITUDE: 2.5 V
MDC IDC STAT BRADY AP VP PERCENT: 73 %
MDC IDC STAT BRADY AP VS PERCENT: 1 %
MDC IDC STAT BRADY AS VP PERCENT: 27 %
MDC IDC STAT BRADY AS VS PERCENT: 1 %
MDC IDC STAT BRADY RA PERCENT PACED: 64 %
Pulse Gen Model: 2110
Pulse Gen Serial Number: 7163077

## 2017-10-16 DIAGNOSIS — D044 Carcinoma in situ of skin of scalp and neck: Secondary | ICD-10-CM | POA: Diagnosis not present

## 2017-10-16 DIAGNOSIS — E7849 Other hyperlipidemia: Secondary | ICD-10-CM | POA: Diagnosis not present

## 2017-10-16 DIAGNOSIS — R7301 Impaired fasting glucose: Secondary | ICD-10-CM | POA: Diagnosis not present

## 2017-10-16 DIAGNOSIS — D045 Carcinoma in situ of skin of trunk: Secondary | ICD-10-CM | POA: Diagnosis not present

## 2017-10-16 DIAGNOSIS — D692 Other nonthrombocytopenic purpura: Secondary | ICD-10-CM | POA: Diagnosis not present

## 2017-10-16 DIAGNOSIS — L814 Other melanin hyperpigmentation: Secondary | ICD-10-CM | POA: Diagnosis not present

## 2017-10-16 DIAGNOSIS — Z125 Encounter for screening for malignant neoplasm of prostate: Secondary | ICD-10-CM | POA: Diagnosis not present

## 2017-10-16 DIAGNOSIS — Z85828 Personal history of other malignant neoplasm of skin: Secondary | ICD-10-CM | POA: Diagnosis not present

## 2017-10-16 DIAGNOSIS — D485 Neoplasm of uncertain behavior of skin: Secondary | ICD-10-CM | POA: Diagnosis not present

## 2017-10-16 DIAGNOSIS — R82998 Other abnormal findings in urine: Secondary | ICD-10-CM | POA: Diagnosis not present

## 2017-10-16 DIAGNOSIS — D1801 Hemangioma of skin and subcutaneous tissue: Secondary | ICD-10-CM | POA: Diagnosis not present

## 2017-10-16 DIAGNOSIS — I10 Essential (primary) hypertension: Secondary | ICD-10-CM | POA: Diagnosis not present

## 2017-10-16 DIAGNOSIS — L578 Other skin changes due to chronic exposure to nonionizing radiation: Secondary | ICD-10-CM | POA: Diagnosis not present

## 2017-10-16 DIAGNOSIS — L570 Actinic keratosis: Secondary | ICD-10-CM | POA: Diagnosis not present

## 2017-10-16 DIAGNOSIS — L820 Inflamed seborrheic keratosis: Secondary | ICD-10-CM | POA: Diagnosis not present

## 2017-10-16 NOTE — Progress Notes (Signed)
Patient ID: [name], male   DOB: [birth year], 77 y.o.   MRN: [id number]   [name] is a 77 y.o. . male seen mostly by EP. History of CHB with pacer 2011 St Jude. 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  All other systems reviewed and negative  General: BP (!) 142/80   Pulse 85   Ht 6' (1.829 m)   Wt 218 lb 3.2 oz (99 kg)   SpO2 96%   BMI 29.59 kg/m  Affect appropriate Healthy:  appears stated age 4: normal Neck supple with no adenopathy JVP normal no bruits no thyromegaly Lungs clear with no wheezing and good diaphragmatic motion Heart:  S1/S2 no murmur, no rub, gallop or click PMI normal pacer under left clavicle  Abdomen: benighn, BS positve, no tenderness, no AAA no bruit.  No HSM or HJR Distal pulses intact with no bruits Marked LE varicosities left greater than right with trace edema  Neuro non-focal Skin warm and dry No muscular weakness    Current Outpatient Medications  Medication Sig Dispense Refill  . atenolol (TENORMIN) 50 MG tablet TAKE ONE TABLET BY MOUTH ONCE DAILY 90 tablet 1  . atorvastatin (LIPITOR) 10 MG tablet Take 10 mg by mouth daily.    Marland Kitchen EPINEPHrine 0.3 mg/0.3 mL IJ SOAJ injection Inject 0.3 mg into the muscle once as needed for anaphylaxis.    . fluorouracil (EFUDEX) 5 % cream Apply 1 application topically daily as needed (for skin cancer treatment). APPLY TO AFFECTED AREAS OF SCALP    .  Glucosamine HCl 1000 MG TABS Take 2 tablets by mouth daily.    Marland Kitchen ketotifen (ALAWAY) 0.025 % ophthalmic solution Place 1 drop into both eyes 2 (two) times daily.    Marland Kitchen losartan (COZAAR) 100 MG tablet Take 100 mg by mouth daily.    . Multiple Vitamin (MULTIVITAMIN) capsule Take 1 capsule by mouth daily.      . nitroGLYCERIN (NITROSTAT) 0.4 MG SL tablet Place 1 tablet (0.4 mg total) under the tongue every 5 (five) minutes as needed for chest pain. Reported on 12/07/2015 25 tablet 3  . NON FORMULARY CPAP Machine at night    . Omega-3 Fatty Acids (FISH OIL) 1200 MG CAPS Take 1 capsule by mouth daily.      . rivaroxaban (XARELTO) 20 MG TABS tablet Take 1 tablet (20 mg total) by mouth daily with supper. 90 tablet 3  . thiamine 100 MG tablet Take 100 mg by mouth daily.     No current facility-administered medications for this visit.     Allergies  Bee venom  Electrocardiogram: 02/21/14   SR v pacing  03/20/15  AV pacing rate 72   Assessment and Plan Flutter:  Post ablation maint NSR continue NOAC  Pacer:  Normal function continue Merlin transmission St Jude f/u Dr Caryl Comes  CAD:  Stable with no angina and good activity level.  Continue medical Rx  Chol:  At goal labs with primary continue lipitor   HTN:  Well  controlled.  Continue current medications and low sodium Dash type diet.    Plasma Cell Dyscrasia:  Seen by Dr Julien Nordmann  Smoldering since 2011 with no Rx And just mild anemia. Myeloma labs to be done September Varicose Veins:  F/U [facility] Dr Greenberg/[place]   Pulmonary:  Improved lungs fully expanded no residual pleurisy          Jenkins Rouge

## 2017-10-19 DIAGNOSIS — G4733 Obstructive sleep apnea (adult) (pediatric): Secondary | ICD-10-CM | POA: Diagnosis not present

## 2017-10-22 ENCOUNTER — Other Ambulatory Visit: Payer: Self-pay | Admitting: Internal Medicine

## 2017-10-23 ENCOUNTER — Encounter: Payer: Self-pay | Admitting: Cardiovascular Disease

## 2017-10-23 ENCOUNTER — Ambulatory Visit: Payer: Medicare HMO | Admitting: Cardiovascular Disease

## 2017-10-23 VITALS — BP 142/80 | HR 85 | Ht 72.0 in | Wt 218.2 lb

## 2017-10-23 DIAGNOSIS — R7301 Impaired fasting glucose: Secondary | ICD-10-CM | POA: Diagnosis not present

## 2017-10-23 DIAGNOSIS — M1711 Unilateral primary osteoarthritis, right knee: Secondary | ICD-10-CM | POA: Diagnosis not present

## 2017-10-23 DIAGNOSIS — I10 Essential (primary) hypertension: Secondary | ICD-10-CM

## 2017-10-23 DIAGNOSIS — I25118 Atherosclerotic heart disease of native coronary artery with other forms of angina pectoris: Secondary | ICD-10-CM | POA: Diagnosis not present

## 2017-10-23 DIAGNOSIS — I2583 Coronary atherosclerosis due to lipid rich plaque: Secondary | ICD-10-CM

## 2017-10-23 DIAGNOSIS — I8312 Varicose veins of left lower extremity with inflammation: Secondary | ICD-10-CM | POA: Diagnosis not present

## 2017-10-23 DIAGNOSIS — Z950 Presence of cardiac pacemaker: Secondary | ICD-10-CM | POA: Diagnosis not present

## 2017-10-23 DIAGNOSIS — I83812 Varicose veins of left lower extremities with pain: Secondary | ICD-10-CM | POA: Diagnosis not present

## 2017-10-23 DIAGNOSIS — M1712 Unilateral primary osteoarthritis, left knee: Secondary | ICD-10-CM | POA: Diagnosis not present

## 2017-10-23 DIAGNOSIS — C9000 Multiple myeloma not having achieved remission: Secondary | ICD-10-CM | POA: Diagnosis not present

## 2017-10-23 DIAGNOSIS — E7849 Other hyperlipidemia: Secondary | ICD-10-CM | POA: Diagnosis not present

## 2017-10-23 DIAGNOSIS — I2510 Atherosclerotic heart disease of native coronary artery without angina pectoris: Secondary | ICD-10-CM | POA: Diagnosis not present

## 2017-10-23 DIAGNOSIS — I83892 Varicose veins of left lower extremities with other complications: Secondary | ICD-10-CM | POA: Diagnosis not present

## 2017-10-23 DIAGNOSIS — Z0000 Encounter for general adult medical examination without abnormal findings: Secondary | ICD-10-CM | POA: Diagnosis not present

## 2017-10-23 DIAGNOSIS — I480 Paroxysmal atrial fibrillation: Secondary | ICD-10-CM | POA: Diagnosis not present

## 2017-10-23 NOTE — Patient Instructions (Signed)

## 2017-10-23 NOTE — Telephone Encounter (Signed)
Xarelto 20mg  refill request received, pt is 77 yrs old, wt-99kg, Crea-0.80 on 06/06/17, last seen by Dr. Johnsie Cancel today 10/23/17. CrCl-108.42ml/min; will send in refill to requested Pharmacy.

## 2017-10-25 DIAGNOSIS — Z85828 Personal history of other malignant neoplasm of skin: Secondary | ICD-10-CM | POA: Diagnosis not present

## 2017-10-25 DIAGNOSIS — D044 Carcinoma in situ of skin of scalp and neck: Secondary | ICD-10-CM | POA: Diagnosis not present

## 2017-10-25 DIAGNOSIS — I8312 Varicose veins of left lower extremity with inflammation: Secondary | ICD-10-CM | POA: Diagnosis not present

## 2017-10-26 DIAGNOSIS — Z1212 Encounter for screening for malignant neoplasm of rectum: Secondary | ICD-10-CM | POA: Diagnosis not present

## 2017-11-30 ENCOUNTER — Ambulatory Visit (INDEPENDENT_AMBULATORY_CARE_PROVIDER_SITE_OTHER): Payer: Medicare HMO | Admitting: *Deleted

## 2017-11-30 DIAGNOSIS — I442 Atrioventricular block, complete: Secondary | ICD-10-CM

## 2017-11-30 NOTE — Progress Notes (Signed)
Remote pacemaker transmission.   

## 2017-12-01 ENCOUNTER — Encounter: Payer: Self-pay | Admitting: Cardiology

## 2017-12-02 ENCOUNTER — Other Ambulatory Visit: Payer: Self-pay | Admitting: Cardiovascular Disease

## 2017-12-04 LAB — CUP PACEART REMOTE DEVICE CHECK
Battery Voltage: 2.81 V
Brady Statistic AP VP Percent: 74 %
Brady Statistic RA Percent Paced: 62 %
Brady Statistic RV Percent Paced: 99 %
Implantable Lead Implant Date: 20111021
Implantable Lead Location: 753859
Implantable Pulse Generator Implant Date: 20111021
Lead Channel Impedance Value: 430 Ohm
Lead Channel Pacing Threshold Amplitude: 0.5 V
Lead Channel Pacing Threshold Pulse Width: 0.5 ms
Lead Channel Setting Pacing Amplitude: 2.5 V
Lead Channel Setting Sensing Sensitivity: 2 mV
MDC IDC LEAD IMPLANT DT: 20111021
MDC IDC LEAD LOCATION: 753860
MDC IDC MSMT BATTERY REMAINING LONGEVITY: 31 mo
MDC IDC MSMT BATTERY REMAINING PERCENTAGE: 29 %
MDC IDC MSMT LEADCHNL RA IMPEDANCE VALUE: 440 Ohm
MDC IDC MSMT LEADCHNL RA SENSING INTR AMPL: 3.3 mV
MDC IDC MSMT LEADCHNL RV PACING THRESHOLD AMPLITUDE: 1.5 V
MDC IDC MSMT LEADCHNL RV PACING THRESHOLD PULSEWIDTH: 0.5 ms
MDC IDC MSMT LEADCHNL RV SENSING INTR AMPL: 10.4 mV
MDC IDC SESS DTM: 20190321112546
MDC IDC SET LEADCHNL RA PACING AMPLITUDE: 1.5 V
MDC IDC SET LEADCHNL RV PACING PULSEWIDTH: 0.5 ms
MDC IDC STAT BRADY AP VS PERCENT: 1 %
MDC IDC STAT BRADY AS VP PERCENT: 26 %
MDC IDC STAT BRADY AS VS PERCENT: 1 %
Pulse Gen Model: 2110
Pulse Gen Serial Number: 7163077

## 2017-12-11 ENCOUNTER — Inpatient Hospital Stay: Payer: Medicare HMO | Attending: Internal Medicine

## 2017-12-11 DIAGNOSIS — Z79899 Other long term (current) drug therapy: Secondary | ICD-10-CM | POA: Diagnosis not present

## 2017-12-11 DIAGNOSIS — I4892 Unspecified atrial flutter: Secondary | ICD-10-CM | POA: Diagnosis not present

## 2017-12-11 DIAGNOSIS — R945 Abnormal results of liver function studies: Secondary | ICD-10-CM | POA: Insufficient documentation

## 2017-12-11 DIAGNOSIS — C9000 Multiple myeloma not having achieved remission: Secondary | ICD-10-CM | POA: Diagnosis present

## 2017-12-11 DIAGNOSIS — Z950 Presence of cardiac pacemaker: Secondary | ICD-10-CM | POA: Diagnosis not present

## 2017-12-11 DIAGNOSIS — F1099 Alcohol use, unspecified with unspecified alcohol-induced disorder: Secondary | ICD-10-CM | POA: Diagnosis not present

## 2017-12-11 DIAGNOSIS — Z7901 Long term (current) use of anticoagulants: Secondary | ICD-10-CM | POA: Insufficient documentation

## 2017-12-11 DIAGNOSIS — R69 Illness, unspecified: Secondary | ICD-10-CM | POA: Diagnosis not present

## 2017-12-11 LAB — COMPREHENSIVE METABOLIC PANEL
ALBUMIN: 3.6 g/dL (ref 3.5–5.0)
ALK PHOS: 59 U/L (ref 40–150)
ALT: 59 U/L — AB (ref 0–55)
ANION GAP: 11 (ref 3–11)
AST: 69 U/L — AB (ref 5–34)
BILIRUBIN TOTAL: 0.8 mg/dL (ref 0.2–1.2)
BUN: 6 mg/dL — AB (ref 7–26)
CALCIUM: 9.6 mg/dL (ref 8.4–10.4)
CO2: 24 mmol/L (ref 22–29)
CREATININE: 0.73 mg/dL (ref 0.70–1.30)
Chloride: 101 mmol/L (ref 98–109)
GFR calc Af Amer: >60 mL/min (ref >60)
GFR calc non Af Amer: >60 mL/min (ref >60)
GLUCOSE: 94 mg/dL (ref 70–140)
Potassium: 3.9 mmol/L (ref 3.5–5.1)
SODIUM: 136 mmol/L (ref 136–145)
TOTAL PROTEIN: 8.3 g/dL (ref 6.4–8.3)

## 2017-12-11 LAB — CBC WITH DIFFERENTIAL/PLATELET
BASOS PCT: 1 %
Basophils Absolute: 0 10*3/uL (ref 0.0–0.1)
EOS PCT: 4 %
Eosinophils Absolute: 0.2 10*3/uL (ref 0.0–0.5)
HEMATOCRIT: 40.9 % (ref 38.4–49.9)
Hemoglobin: 14.4 g/dL (ref 13.0–17.1)
Lymphocytes Relative: 19 %
Lymphs Abs: 1 10*3/uL (ref 0.9–3.3)
MCH: 34.4 pg — ABNORMAL HIGH (ref 27.2–33.4)
MCHC: 35.2 g/dL (ref 32.0–36.0)
MCV: 97.7 fL (ref 79.3–98.0)
MONO ABS: 0.6 10*3/uL (ref 0.1–0.9)
Monocytes Relative: 12 %
NEUTROS ABS: 3.4 10*3/uL (ref 1.5–6.5)
Neutrophils Relative %: 64 %
Platelets: 226 10*3/uL (ref 140–400)
RBC: 4.19 MIL/uL — ABNORMAL LOW (ref 4.20–5.82)
RDW: 12.9 % (ref 11.0–14.6)
WBC: 5.2 10*3/uL (ref 4.0–10.3)

## 2017-12-11 LAB — LACTATE DEHYDROGENASE: LDH: 215 U/L (ref 125–245)

## 2017-12-12 DIAGNOSIS — G4733 Obstructive sleep apnea (adult) (pediatric): Secondary | ICD-10-CM | POA: Diagnosis not present

## 2017-12-12 LAB — KAPPA/LAMBDA LIGHT CHAINS
KAPPA FREE LGHT CHN: 13.1 mg/L (ref 3.3–19.4)
Kappa, lambda light chain ratio: 0.2 — ABNORMAL LOW (ref 0.26–1.65)
Lambda free light chains: 64.1 mg/L — ABNORMAL HIGH (ref 5.7–26.3)

## 2017-12-12 LAB — IGG, IGA, IGM
IGM (IMMUNOGLOBULIN M), SRM: 3615 mg/dL — AB (ref 15–143)
IgA: 82 mg/dL (ref 61–437)
IgG (Immunoglobin G), Serum: 398 mg/dL — ABNORMAL LOW (ref 700–1600)

## 2017-12-12 LAB — BETA 2 MICROGLOBULIN, SERUM: Beta-2 Microglobulin: 1.5 mg/L (ref 0.6–2.4)

## 2017-12-18 ENCOUNTER — Encounter: Payer: Self-pay | Admitting: Internal Medicine

## 2017-12-18 ENCOUNTER — Telehealth: Payer: Self-pay | Admitting: Internal Medicine

## 2017-12-18 ENCOUNTER — Inpatient Hospital Stay (HOSPITAL_BASED_OUTPATIENT_CLINIC_OR_DEPARTMENT_OTHER): Payer: Medicare HMO | Admitting: Internal Medicine

## 2017-12-18 VITALS — BP 138/75 | HR 70 | Temp 98.7°F | Resp 18 | Ht 72.0 in | Wt 223.4 lb

## 2017-12-18 DIAGNOSIS — I4892 Unspecified atrial flutter: Secondary | ICD-10-CM

## 2017-12-18 DIAGNOSIS — Z79899 Other long term (current) drug therapy: Secondary | ICD-10-CM | POA: Diagnosis not present

## 2017-12-18 DIAGNOSIS — Z7901 Long term (current) use of anticoagulants: Secondary | ICD-10-CM | POA: Diagnosis not present

## 2017-12-18 DIAGNOSIS — Z950 Presence of cardiac pacemaker: Secondary | ICD-10-CM

## 2017-12-18 DIAGNOSIS — C9000 Multiple myeloma not having achieved remission: Secondary | ICD-10-CM | POA: Diagnosis not present

## 2017-12-18 DIAGNOSIS — R945 Abnormal results of liver function studies: Secondary | ICD-10-CM | POA: Diagnosis not present

## 2017-12-18 DIAGNOSIS — F1099 Alcohol use, unspecified with unspecified alcohol-induced disorder: Secondary | ICD-10-CM | POA: Diagnosis not present

## 2017-12-18 DIAGNOSIS — R69 Illness, unspecified: Secondary | ICD-10-CM | POA: Diagnosis not present

## 2017-12-18 NOTE — Progress Notes (Signed)
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   INTERVAL HISTORY: [name] 77 y.o. male returns to the clinic today for follow-up visit.  The patient is feeling fine today with no specific complaints except for arthralgia.  He denied having any recent chest pain, shortness of breath, cough or hemoptysis.  He denied having any weight loss or night sweats.  He has no nausea, vomiting, diarrhea or constipation.  He continues to drink a significant amount of alcohol on daily basis.  He was advised by his primary care physician to quit alcohol drinking because of the changes in his liver enzymes.  He had repeat myeloma panel performed recently and he is here for evaluation and discussion of his lab results.   MEDICAL HISTORY: Past Medical History:  Diagnosis Date  . Atrial flutter ([place])    A. s/p prior ablation;  B.  s/p CTI ablation 10/24/10 (Dr. Caryl Comes)  . CAD (coronary artery disease)    A.  s/p CFX in the past;   B.  cath 06/2010: LAD 30-40%, D1 50%, OM1 50%, AVCFX stent 30%, dCFX 70-80% (med Rx), mRCA 40%;      C.  Echo 06/2010: EF 60-65%, mod LVH; mild LAE     . Cubital tunnel syndrome   . Degenerative disc disease   . Heart block   . Hx of adenomatous colonic polyps   . Hyperlipidemia   . Hypertension   . Multiple myeloma   . Sleep apnea   . 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 Venous insufficiency     ALLERGIES:  is allergic to bee venom.  MEDICATIONS:  Current Outpatient Medications  Medication Sig Dispense Refill  . atenolol (TENORMIN) 50 MG tablet TAKE 1 TABLET BY MOUTH ONCE DAILY 90 tablet 2  . atorvastatin (LIPITOR) 10 MG tablet Take 10 mg by mouth daily.    Marland Kitchen EPINEPHrine 0.3 mg/0.3 mL IJ SOAJ  injection Inject 0.3 mg into the muscle once as needed for anaphylaxis.    . fluorouracil (EFUDEX) 5 % cream Apply 1 application topically daily as needed (for skin cancer treatment). APPLY TO AFFECTED AREAS OF SCALP    . Glucosamine HCl 1000 MG TABS Take 2 tablets by mouth daily.    Marland Kitchen ketotifen (ALAWAY) 0.025 % ophthalmic solution Place 1 drop into both eyes 2 (two) times daily.    Marland Kitchen losartan (COZAAR) 100 MG tablet Take 100 mg by mouth daily.    . Multiple Vitamin (MULTIVITAMIN) capsule Take 1 capsule by mouth daily.      . nitroGLYCERIN (NITROSTAT) 0.4 MG SL tablet Place 1 tablet (0.4 mg total) under the tongue every 5 (five) minutes as needed for chest pain. Reported on 12/07/2015 25 tablet 3  . NON FORMULARY CPAP Machine at night    . Omega-3 Fatty Acids (FISH OIL) 1200 MG CAPS Take 1 capsule by mouth daily.      Marland Kitchen thiamine 100 MG tablet Take 100 mg by mouth daily.    Alveda Reasons 20 MG TABS tablet TAKE ONE TABLET BY MOUTH ONCE DAILY WITH  SUPPER 90 tablet 3   No current facility-administered medications for this visit.     SURGICAL HISTORY:  Past Surgical History:  Procedure Laterality Date  .  COLONOSCOPY  10-07-2009   TA polyp, tics, hems   . CORONARY ANGIOPLASTY WITH STENT PLACEMENT  2005  . PACEMAKER PLACEMENT  2011  . POLYPECTOMY    . Right olecranon nerve surgery    . VENTRAL HERNIA REPAIR      REVIEW OF SYSTEMS:  A comprehensive review of systems was negative except for: Constitutional: positive for fatigue Musculoskeletal: positive for arthralgias   PHYSICAL EXAMINATION: General appearance: alert, cooperative, fatigued and no distress Head: Normocephalic, without obvious abnormality, atraumatic Neck: no adenopathy, no JVD, supple, symmetrical, trachea midline and thyroid not enlarged, symmetric, no tenderness/mass/nodules Lymph nodes: Cervical, supraclavicular, and axillary nodes normal. Resp: clear to auscultation bilaterally Back: symmetric, no curvature. ROM normal. No  CVA tenderness. Cardio: regular rate and rhythm, S1, S2 normal, no murmur, click, rub or gallop GI: soft, non-tender; bowel sounds normal; no masses,  no organomegaly Extremities: extremities normal, atraumatic, no cyanosis or edema  ECOG PERFORMANCE STATUS: 1 - Symptomatic but completely ambulatory  Blood pressure 138/75, pulse 70, temperature 98.7 F (37.1 C), temperature source Oral, resp. rate 18, height 6' (1.829 m), weight 223 lb 6.4 oz (101.3 kg), SpO2 99 %.  LABORATORY DATA: Lab Results  Component Value Date   WBC 5.2 12/11/2017   HGB 14.4 12/11/2017   HCT 40.9 12/11/2017   MCV 97.7 12/11/2017   PLT 226 12/11/2017      Chemistry      Component Value Date/Time   NA 136 12/11/2017 0912   NA 136 06/06/2017 1058   K 3.9 12/11/2017 0912   K 4.0 06/06/2017 1058   CL 101 12/11/2017 0912   CL 97 (L) 12/03/2012 1015   CO2 24 12/11/2017 0912   CO2 24 06/06/2017 1058   BUN 6 (L) 12/11/2017 0912   BUN 5.6 (L) 06/06/2017 1058   CREATININE 0.73 12/11/2017 0912   CREATININE 0.8 06/06/2017 1058      Component Value Date/Time   CALCIUM 9.6 12/11/2017 0912   CALCIUM 9.1 06/06/2017 1058   ALKPHOS 59 12/11/2017 0912   ALKPHOS 61 06/06/2017 1058   AST 69 (H) 12/11/2017 0912   AST 36 (H) 06/06/2017 1058   ALT 59 (H) 12/11/2017 0912   ALT 23 06/06/2017 1058   BILITOT 0.8 12/11/2017 0912   BILITOT 0.46 06/06/2017 1058       RADIOGRAPHIC STUDIES: No results found.  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 The myeloma panel performed recently showed no concerning findings for disease progression.  I recommended for the patient to continue on observation. For the liver dysfunction, I strongly advised him to quit alcohol drinking. He will come back for follow-up visit in 6 months for evaluation with repeat myeloma panel. The patient was  advised to call immediately if he has any concerning symptoms in the interval. The patient voices understanding of current disease status and treatment options and is in agreement with the current care plan.  All questions were answered. The patient knows to call the clinic with any problems, questions or concerns. We can certainly see the patient much sooner if necessary. I spent 10 minutes counseling the patient face to face. The total time spent in the appointment was 15 minutes.  Disclaimer: This note was dictated with voice recognition software. Similar sounding words can inadvertently be transcribed and may not be corrected upon review.

## 2017-12-18 NOTE — Telephone Encounter (Signed)
Scheduled appt per 4/8 los - Gave patient AVS and calender per los. Lab and fu in 6 months.

## 2018-01-05 DIAGNOSIS — L0390 Cellulitis, unspecified: Secondary | ICD-10-CM | POA: Diagnosis not present

## 2018-01-25 DIAGNOSIS — R310 Gross hematuria: Secondary | ICD-10-CM | POA: Diagnosis not present

## 2018-02-08 DIAGNOSIS — R310 Gross hematuria: Secondary | ICD-10-CM | POA: Diagnosis not present

## 2018-02-14 DIAGNOSIS — H6123 Impacted cerumen, bilateral: Secondary | ICD-10-CM | POA: Diagnosis not present

## 2018-03-01 ENCOUNTER — Ambulatory Visit (INDEPENDENT_AMBULATORY_CARE_PROVIDER_SITE_OTHER): Payer: Medicare HMO | Admitting: *Deleted

## 2018-03-01 DIAGNOSIS — I8312 Varicose veins of left lower extremity with inflammation: Secondary | ICD-10-CM | POA: Diagnosis not present

## 2018-03-01 DIAGNOSIS — I442 Atrioventricular block, complete: Secondary | ICD-10-CM | POA: Diagnosis not present

## 2018-03-01 DIAGNOSIS — R310 Gross hematuria: Secondary | ICD-10-CM | POA: Diagnosis not present

## 2018-03-01 DIAGNOSIS — I8311 Varicose veins of right lower extremity with inflammation: Secondary | ICD-10-CM | POA: Diagnosis not present

## 2018-03-01 DIAGNOSIS — I83893 Varicose veins of bilateral lower extremities with other complications: Secondary | ICD-10-CM | POA: Diagnosis not present

## 2018-03-01 NOTE — Progress Notes (Signed)
Remote pacemaker transmission.   

## 2018-03-20 LAB — CUP PACEART REMOTE DEVICE CHECK
Battery Remaining Longevity: 27 mo
Battery Remaining Percentage: 25 %
Brady Statistic AP VS Percent: 1 %
Brady Statistic AS VS Percent: 1 %
Brady Statistic RV Percent Paced: 99 %
Implantable Lead Implant Date: 20111021
Implantable Lead Location: 753860
Lead Channel Impedance Value: 440 Ohm
Lead Channel Pacing Threshold Amplitude: 1.5 V
Lead Channel Pacing Threshold Pulse Width: 0.5 ms
Lead Channel Sensing Intrinsic Amplitude: 3.1 mV
Lead Channel Setting Pacing Amplitude: 2.5 V
Lead Channel Setting Pacing Pulse Width: 0.5 ms
MDC IDC LEAD IMPLANT DT: 20111021
MDC IDC LEAD LOCATION: 753859
MDC IDC MSMT BATTERY VOLTAGE: 2.8 V
MDC IDC MSMT LEADCHNL RA PACING THRESHOLD AMPLITUDE: 0.5 V
MDC IDC MSMT LEADCHNL RA PACING THRESHOLD PULSEWIDTH: 0.5 ms
MDC IDC MSMT LEADCHNL RV IMPEDANCE VALUE: 450 Ohm
MDC IDC MSMT LEADCHNL RV SENSING INTR AMPL: 9.1 mV
MDC IDC PG IMPLANT DT: 20111021
MDC IDC PG SERIAL: 7163077
MDC IDC SESS DTM: 20190620101947
MDC IDC SET LEADCHNL RA PACING AMPLITUDE: 1.5 V
MDC IDC SET LEADCHNL RV SENSING SENSITIVITY: 2 mV
MDC IDC STAT BRADY AP VP PERCENT: 74 %
MDC IDC STAT BRADY AS VP PERCENT: 26 %
MDC IDC STAT BRADY RA PERCENT PACED: 53 %

## 2018-04-03 DIAGNOSIS — R071 Chest pain on breathing: Secondary | ICD-10-CM | POA: Diagnosis not present

## 2018-04-12 ENCOUNTER — Ambulatory Visit (HOSPITAL_COMMUNITY)
Admission: RE | Admit: 2018-04-12 | Discharge: 2018-04-12 | Disposition: A | Discharge status: Home / Self Care | Payer: Medicare HMO | Source: Ambulatory Visit | Attending: Nurse Practitioner | Admitting: Nurse Practitioner

## 2018-04-12 VITALS — BP 118/72 | HR 70 | Ht 72.0 in | Wt 220.0 lb

## 2018-04-12 DIAGNOSIS — Z950 Presence of cardiac pacemaker: Secondary | ICD-10-CM | POA: Insufficient documentation

## 2018-04-12 DIAGNOSIS — Z955 Presence of coronary angioplasty implant and graft: Secondary | ICD-10-CM | POA: Insufficient documentation

## 2018-04-12 DIAGNOSIS — Z86010 Personal history of colonic polyps: Secondary | ICD-10-CM | POA: Diagnosis not present

## 2018-04-12 DIAGNOSIS — G4730 Sleep apnea, unspecified: Secondary | ICD-10-CM | POA: Insufficient documentation

## 2018-04-12 DIAGNOSIS — E785 Hyperlipidemia, unspecified: Secondary | ICD-10-CM | POA: Diagnosis not present

## 2018-04-12 DIAGNOSIS — I10 Essential (primary) hypertension: Secondary | ICD-10-CM | POA: Diagnosis not present

## 2018-04-12 DIAGNOSIS — Z7901 Long term (current) use of anticoagulants: Secondary | ICD-10-CM | POA: Diagnosis not present

## 2018-04-12 DIAGNOSIS — Z8042 Family history of malignant neoplasm of prostate: Secondary | ICD-10-CM | POA: Diagnosis not present

## 2018-04-12 DIAGNOSIS — Z91030 Bee allergy status: Secondary | ICD-10-CM | POA: Diagnosis not present

## 2018-04-12 DIAGNOSIS — I481 Persistent atrial fibrillation: Secondary | ICD-10-CM | POA: Diagnosis not present

## 2018-04-12 DIAGNOSIS — Z8249 Family history of ischemic heart disease and other diseases of the circulatory system: Secondary | ICD-10-CM | POA: Insufficient documentation

## 2018-04-12 DIAGNOSIS — I459 Conduction disorder, unspecified: Secondary | ICD-10-CM | POA: Diagnosis not present

## 2018-04-12 DIAGNOSIS — Z79899 Other long term (current) drug therapy: Secondary | ICD-10-CM | POA: Insufficient documentation

## 2018-04-12 DIAGNOSIS — I2510 Atherosclerotic heart disease of native coronary artery without angina pectoris: Secondary | ICD-10-CM | POA: Diagnosis not present

## 2018-04-12 DIAGNOSIS — Z87891 Personal history of nicotine dependence: Secondary | ICD-10-CM | POA: Diagnosis not present

## 2018-04-12 DIAGNOSIS — I872 Venous insufficiency (chronic) (peripheral): Secondary | ICD-10-CM | POA: Insufficient documentation

## 2018-04-12 DIAGNOSIS — I4819 Other persistent atrial fibrillation: Secondary | ICD-10-CM

## 2018-04-12 DIAGNOSIS — C9000 Multiple myeloma not having achieved remission: Secondary | ICD-10-CM | POA: Insufficient documentation

## 2018-04-12 LAB — CBC
HCT: 37.3 % — ABNORMAL LOW (ref 39.0–52.0)
HEMOGLOBIN: 12.6 g/dL — AB (ref 13.0–17.0)
MCH: 33.6 pg (ref 26.0–34.0)
MCHC: 33.8 g/dL (ref 30.0–36.0)
MCV: 99.5 fL (ref 78.0–100.0)
Platelets: 226 10*3/uL (ref 150–400)
RBC: 3.75 MIL/uL — ABNORMAL LOW (ref 4.22–5.81)
RDW: 12.1 % (ref 11.5–15.5)
WBC: 7.1 10*3/uL (ref 4.0–10.5)

## 2018-04-12 LAB — BASIC METABOLIC PANEL
ANION GAP: 11 (ref 5–15)
BUN: 8 mg/dL (ref 8–23)
CHLORIDE: 98 mmol/L (ref 98–111)
CO2: 27 mmol/L (ref 22–32)
Calcium: 9.5 mg/dL (ref 8.9–10.3)
Creatinine, Ser: 0.7 mg/dL (ref 0.61–1.24)
GFR calc Af Amer: >60 mL/min (ref >60)
GFR calc non Af Amer: >60 mL/min (ref >60)
GLUCOSE: 89 mg/dL (ref 70–99)
Potassium: 4.1 mmol/L (ref 3.5–5.1)
Sodium: 136 mmol/L (ref 135–145)

## 2018-04-12 LAB — TSH: TSH: 3.41 u[IU]/mL (ref 0.350–4.500)

## 2018-04-12 NOTE — Patient Instructions (Signed)
Cardioversion scheduled for Friday, August 9th  - Arrive at the [facility] and go to admitting at [facility] not eat or drink anything after midnight the night prior to your procedure.  - Take all your morning medication with a sip of water prior to arrival.  - You will not be able to drive home after your procedure.

## 2018-04-12 NOTE — H&P (View-Only) (Signed)
 Primary Care Physician: Avva, Ravisankar, MD Referring Physician: Dr. Klein   [name] is a 77 y.o. male with a h/o CAD, PPM for heart block,HTN, aflutter ablation that is in the afib clinic for scheduling of cardioversion after Dr. Klein reviewed  paceart showing persistent afib for the last 3 months. He is on xarelto He is unaware. [place] some [place]/scrapes. Checked at an urgent care several days later without  any significant findings.   Today, he denies symptoms of palpitations, chest pain, shortness of breath, orthopnea, PND, lower extremity edema, dizziness, presyncope, syncope, or neurologic sequela. The patient is tolerating medications without difficulties and is otherwise without complaint today.   Past Medical History:  Diagnosis Date  . Atrial flutter ([facility])    A. s/p prior ablation;  B.  s/p CTI ablation 10/24/10 (Dr. Klein)  . CAD (coronary artery disease)    A.  s/p CFX in the past;   B.  cath 06/2010: LAD 30-40%, D1 50%, OM1 50%, AVCFX stent 30%, dCFX 70-80% (med Rx), mRCA 40%;      C.  Echo 06/2010: EF 60-65%, mod LVH; mild LAE     . Cubital tunnel syndrome   . Degenerative disc disease   . Heart block   . Hx of adenomatous colonic polyps   . Hyperlipidemia   . Hypertension   . Multiple myeloma   . Sleep apnea   . 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 Venous insufficiency    Past Surgical History:  Procedure Laterality Date  . COLONOSCOPY  10-07-2009   TA polyp, tics, hems   . CORONARY ANGIOPLASTY WITH STENT PLACEMENT  2005  . PACEMAKER PLACEMENT  2011  . POLYPECTOMY    . Right olecranon nerve surgery    . VENTRAL HERNIA REPAIR      Current Outpatient Medications  Medication Sig Dispense Refill  . atenolol (TENORMIN) 50 MG tablet TAKE 1 TABLET BY MOUTH ONCE DAILY 90 tablet 2  . atorvastatin (LIPITOR) 10 MG tablet Take 10 mg by mouth daily.    . EPINEPHrine 0.3  mg/0.3 mL IJ SOAJ injection Inject 0.3 mg into the muscle once as needed for anaphylaxis.    . finasteride (PROSCAR) 5 MG tablet Take 5 mg by mouth daily.    . fluorouracil (EFUDEX) 5 % cream Apply 1 application topically daily as needed (for skin cancer treatment). APPLY TO AFFECTED AREAS OF SCALP    . Glucosamine HCl 1000 MG TABS Take 2 tablets by mouth daily.    . ketotifen (ALAWAY) 0.025 % ophthalmic solution Place 1 drop into both eyes 2 (two) times daily.    . losartan (COZAAR) 100 MG tablet Take 100 mg by mouth daily.    . Multiple Vitamin (MULTIVITAMIN) capsule Take 1 capsule by mouth daily.      . nitroGLYCERIN (NITROSTAT) 0.4 MG SL tablet Place 1 tablet (0.4 mg total) under the tongue every 5 (five) minutes as needed for chest pain. Reported on 12/07/2015 25 tablet 3  . NON FORMULARY CPAP Machine at night    . Omega-3 Fatty Acids (FISH OIL) 1200 MG CAPS Take 1 capsule by mouth daily.      . thiamine 100 MG tablet Take 100 mg by mouth daily.    . XARELTO 20 MG TABS tablet TAKE ONE TABLET BY MOUTH ONCE DAILY WITH  SUPPER 90 tablet 3     No current facility-administered medications for this encounter.     Allergies  Allergen Reactions  . Bee Venom Anaphylaxis         Social History   Socioeconomic History  . Marital status: Divorced    Spouse name: Not on file  . Number of children: Not on file  . Years of education: Not on file  . Highest education level: Not on file  Occupational History  . Occupation: Retired from property management    Employer: RETIRED  . Occupation: still farms part time  Social Needs  . Financial resource strain: Not on file  . Food insecurity:    Worry: Not on file    Inability: Not on file  . Transportation needs:    Medical: Not on file    Non-medical: Not on file  Tobacco Use  . Smoking status: Former Smoker    Packs/day: 1.00    Years: 49.00    Pack years: 49.00    Types: Cigarettes    Last attempt to quit: 09/12/2004    Years since  quitting: 13.5  . Smokeless tobacco: Former User  . Tobacco comment: started at age 15; smoked 1 ppd; quit in 2006  Substance and Sexual Activity  . Alcohol use: Yes    Alcohol/week: 16.8 oz    Types: 28 Standard drinks or equivalent per week    Comment: occasional  . Drug use: No  . Sexual activity: Not Currently  Lifestyle  . Physical activity:    Days per week: Not on file    Minutes per session: Not on file  . Stress: Not on file  Relationships  . Social connections:    Talks on phone: Not on file    Gets together: Not on file    Attends religious service: Not on file    Active member of club or organization: Not on file    Attends meetings of clubs or organizations: Not on file    Relationship status: Not on file  . Intimate partner violence:    Fear of current or ex partner: Not on file    Emotionally abused: Not on file    Physically abused: Not on file    Forced sexual activity: Not on file  Other Topics Concern  . Not on file  Social History Narrative  . Not on file    Family History  Problem Relation Age of Onset  . Heart disease Father   . Cancer Father        prostate  . Heart disease Brother   . Colon cancer Neg Hx     ROS- All systems are reviewed and negative except as per the HPI above  Physical Exam: Vitals:   04/12/18 1023  BP: 118/72  Pulse: 70  Weight: 220 lb (99.8 kg)  Height: 6' (1.829 m)   Wt Readings from Last 3 Encounters:  04/12/18 220 lb (99.8 kg)  12/18/17 223 lb 6.4 oz (101.3 kg)  10/23/17 218 lb 3.2 oz (99 kg)    Labs: Lab Results  Component Value Date   NA 136 04/12/2018   K 4.1 04/12/2018   CL 98 04/12/2018   CO2 27 04/12/2018   GLUCOSE 89 04/12/2018   BUN 8 04/12/2018   CREATININE 0.70 04/12/2018   CALCIUM 9.5 04/12/2018   MG 2.4 07/01/2010   Lab Results  Component Value Date   INR 1.16 04/28/2013   Lab Results  Component Value Date   CHOL 146 10/17/2007   HDL   27.0 (L) 10/17/2007   LDLCALC 102 (H)  10/17/2007   TRIG 87 10/17/2007     GEN- The patient is well appearing, alert and oriented x 3 today.   Head- normocephalic, atraumatic Eyes-  Sclera clear, conjunctiva pink Ears- hearing intact Oropharynx- clear Neck- supple, no JVP Lymph- no cervical lymphadenopathy Lungs- Clear to ausculation bilaterally, normal work of breathing Heart- Regular rate and rhythm, no murmurs, rubs or gallops, PMI not laterally displaced GI- soft, NT, ND, + BS Extremities- no clubbing, cyanosis, or edema MS- no significant deformity or atrophy Skin- no rash or lesion Psych- euthymic mood, full affect Neuro- strength and sensation are intact  EKG-v paced rhythm at 70 bpm, qrs int 140 ms, qtc 486 ms paceart reviewed    Assessment and Plan: 1. Persistent afib- Noted on paceart although pt is not symptomatic  Will be scheduled for cardioversion per Dr. Caryl Comes Risk vrs benefit explained to pt He feels that he did miss a xarelto dose early July, but is clear no missed doses for the last 3 weeks Continue atenolol without change Cbc/tsh/bmet today  2. HTN Stable   F/u with Dr. Johnsie Cancel as scheduled 8/20  Geroge Baseman. Nyliah Nierenberg, [facility] [address] [place], [place] [address] [phone]

## 2018-04-12 NOTE — Progress Notes (Signed)
Primary Care Physician: [name], MD Referring Physician: Dr. Grandville Silos is a 77 y.o. male with a h/o CAD, PPM for heart block,HTN, aflutter ablation that is in the afib clinic for scheduling of cardioversion after Dr. Caryl Comes reviewed  paceart showing persistent afib for the last 3 months. He is on xarelto He is unaware. States that [place] with some cuts/scrapes. Checked at an urgent care several days later without  any significant findings.   Today, he denies symptoms of palpitations, chest pain, shortness of breath, orthopnea, PND, lower extremity edema, dizziness, presyncope, syncope, or neurologic sequela. The patient is tolerating medications without difficulties and is otherwise without complaint today.   Past Medical History:  Diagnosis Date  . Atrial flutter ([place])    A. s/p prior ablation;  B.  s/p CTI ablation 10/24/10 (Dr. Caryl Comes)  . CAD (coronary artery disease)    A.  s/p CFX in the past;   B.  cath 06/2010: LAD 30-40%, D1 50%, OM1 50%, AVCFX stent 30%, dCFX 70-80% (med Rx), mRCA 40%;      C.  Echo 06/2010: EF 60-65%, mod LVH; mild LAE     . Cubital tunnel syndrome   . Degenerative disc disease   . Heart block   . Hx of adenomatous colonic polyps   . Hyperlipidemia   . Hypertension   . Multiple myeloma   . Sleep apnea   . 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 Venous insufficiency    Past Surgical History:  Procedure Laterality Date  . COLONOSCOPY  10-07-2009   TA polyp, tics, hems   . CORONARY ANGIOPLASTY WITH STENT PLACEMENT  2005  . PACEMAKER PLACEMENT  2011  . POLYPECTOMY    . Right olecranon nerve surgery    . VENTRAL HERNIA REPAIR      Current Outpatient Medications  Medication Sig Dispense Refill  . atenolol (TENORMIN) 50 MG tablet TAKE 1 TABLET BY MOUTH ONCE DAILY 90 tablet 2  . atorvastatin (LIPITOR) 10 MG tablet Take 10 mg by mouth daily.    Marland Kitchen EPINEPHrine 0.3  mg/0.3 mL IJ SOAJ injection Inject 0.3 mg into the muscle once as needed for anaphylaxis.    . finasteride (PROSCAR) 5 MG tablet Take 5 mg by mouth daily.    . fluorouracil (EFUDEX) 5 % cream Apply 1 application topically daily as needed (for skin cancer treatment). APPLY TO AFFECTED AREAS OF SCALP    . Glucosamine HCl 1000 MG TABS Take 2 tablets by mouth daily.    Marland Kitchen ketotifen (ALAWAY) 0.025 % ophthalmic solution Place 1 drop into both eyes 2 (two) times daily.    Marland Kitchen losartan (COZAAR) 100 MG tablet Take 100 mg by mouth daily.    . Multiple Vitamin (MULTIVITAMIN) capsule Take 1 capsule by mouth daily.      . nitroGLYCERIN (NITROSTAT) 0.4 MG SL tablet Place 1 tablet (0.4 mg total) under the tongue every 5 (five) minutes as needed for chest pain. Reported on 12/07/2015 25 tablet 3  . NON FORMULARY CPAP Machine at night    . Omega-3 Fatty Acids (FISH OIL) 1200 MG CAPS Take 1 capsule by mouth daily.      Marland Kitchen thiamine 100 MG tablet Take 100 mg by mouth daily.    Alveda Reasons 20 MG TABS tablet TAKE ONE TABLET BY MOUTH ONCE DAILY WITH  SUPPER 90 tablet 3  No current facility-administered medications for this encounter.     Allergies  Allergen Reactions  . Bee Venom Anaphylaxis         Social History   Socioeconomic History  . Marital status: Divorced    Spouse name: Not on file  . Number of children: Not on file  . Years of education: Not on file  . Highest education level: Not on file  Occupational History  . Occupation: Retired from Retail banker: RETIRED  . Occupation: still farms part time  Social Needs  . Financial resource strain: Not on file  . Food insecurity:    Worry: Not on file    Inability: Not on file  . Transportation needs:    Medical: Not on file    Non-medical: Not on file  Tobacco Use  . Smoking status: Former Smoker    Packs/day: 1.00    Years: 49.00    Pack years: 49.00    Types: Cigarettes    Last attempt to quit: 09/12/2004    Years since  quitting: 13.5  . Smokeless tobacco: Former Systems developer  . Tobacco comment: started at age 51; smoked 1 ppd; quit in 2006  Substance and Sexual Activity  . Alcohol use: Yes    Alcohol/week: 16.8 oz    Types: 28 Standard drinks or equivalent per week    Comment: occasional  . Drug use: No  . Sexual activity: Not Currently  Lifestyle  . Physical activity:    Days per week: Not on file    Minutes per session: Not on file  . Stress: Not on file  Relationships  . Social connections:    Talks on phone: Not on file    Gets together: Not on file    Attends religious service: Not on file    Active member of club or organization: Not on file    Attends meetings of clubs or organizations: Not on file    Relationship status: Not on file  . Intimate partner violence:    Fear of current or ex partner: Not on file    Emotionally abused: Not on file    Physically abused: Not on file    Forced sexual activity: Not on file  Other Topics Concern  . Not on file  Social History Narrative  . Not on file    Family History  Problem Relation Age of Onset  . Heart disease Father   . Cancer Father        prostate  . Heart disease Brother   . Colon cancer Neg Hx     ROS- All systems are reviewed and negative except as per the HPI above  Physical Exam: Vitals:   04/12/18 1023  BP: 118/72  Pulse: 70  Weight: 220 lb (99.8 kg)  Height: 6' (1.829 m)   Wt Readings from Last 3 Encounters:  04/12/18 220 lb (99.8 kg)  12/18/17 223 lb 6.4 oz (101.3 kg)  10/23/17 218 lb 3.2 oz (99 kg)    Labs: Lab Results  Component Value Date   NA 136 04/12/2018   K 4.1 04/12/2018   CL 98 04/12/2018   CO2 27 04/12/2018   GLUCOSE 89 04/12/2018   BUN 8 04/12/2018   CREATININE 0.70 04/12/2018   CALCIUM 9.5 04/12/2018   MG 2.4 07/01/2010   Lab Results  Component Value Date   INR 1.16 04/28/2013   Lab Results  Component Value Date   CHOL 146 10/17/2007   HDL  27.0 (L) 10/17/2007   LDLCALC 102 (H)  10/17/2007   TRIG 87 10/17/2007     GEN- The patient is well appearing, alert and oriented x 3 today.   Head- normocephalic, atraumatic Eyes-  Sclera clear, conjunctiva pink Ears- hearing intact Oropharynx- clear Neck- supple, no JVP Lymph- no cervical lymphadenopathy Lungs- Clear to ausculation bilaterally, normal work of breathing Heart- Regular rate and rhythm, no murmurs, rubs or gallops, PMI not laterally displaced GI- soft, NT, ND, + BS Extremities- no clubbing, cyanosis, or edema MS- no significant deformity or atrophy Skin- no rash or lesion Psych- euthymic mood, full affect Neuro- strength and sensation are intact  EKG-v paced rhythm at 70 bpm, qrs int 140 ms, qtc 486 ms paceart reviewed    Assessment and Plan: 1. Persistent afib- Noted on paceart although pt is not symptomatic  Will be scheduled for cardioversion per Dr. Caryl Comes Risk vrs benefit explained to pt He feels that he did miss a xarelto dose early July, but is clear no missed doses for the last 3 weeks Continue atenolol without change Cbc/tsh/bmet today  2. HTN Stable   F/u with Dr. Johnsie Cancel as scheduled 8/20  Geroge Baseman. Annalyce Lanpher, [facility] [address] [place], [place] [address] [phone]

## 2018-04-20 ENCOUNTER — Ambulatory Visit (HOSPITAL_COMMUNITY): Payer: Medicare HMO | Admitting: Certified Registered"

## 2018-04-20 ENCOUNTER — Ambulatory Visit (HOSPITAL_COMMUNITY)
Admission: RE | Admit: 2018-04-20 | Discharge: 2018-04-20 | Disposition: A | Discharge status: Home / Self Care | Payer: Medicare HMO | Source: Ambulatory Visit | Attending: Cardiovascular Disease | Admitting: Cardiovascular Disease

## 2018-04-20 ENCOUNTER — Encounter (HOSPITAL_COMMUNITY)
Admission: RE | Disposition: A | Discharge status: Home / Self Care | Payer: Self-pay | Source: Ambulatory Visit | Attending: Cardiovascular Disease

## 2018-04-20 ENCOUNTER — Encounter (HOSPITAL_COMMUNITY): Payer: Self-pay | Admitting: *Deleted

## 2018-04-20 DIAGNOSIS — Z955 Presence of coronary angioplasty implant and graft: Secondary | ICD-10-CM | POA: Diagnosis not present

## 2018-04-20 DIAGNOSIS — I2510 Atherosclerotic heart disease of native coronary artery without angina pectoris: Secondary | ICD-10-CM | POA: Insufficient documentation

## 2018-04-20 DIAGNOSIS — Z87891 Personal history of nicotine dependence: Secondary | ICD-10-CM | POA: Insufficient documentation

## 2018-04-20 DIAGNOSIS — I10 Essential (primary) hypertension: Secondary | ICD-10-CM | POA: Insufficient documentation

## 2018-04-20 DIAGNOSIS — I4891 Unspecified atrial fibrillation: Secondary | ICD-10-CM | POA: Diagnosis not present

## 2018-04-20 DIAGNOSIS — Z7901 Long term (current) use of anticoagulants: Secondary | ICD-10-CM | POA: Diagnosis not present

## 2018-04-20 DIAGNOSIS — I872 Venous insufficiency (chronic) (peripheral): Secondary | ICD-10-CM | POA: Insufficient documentation

## 2018-04-20 DIAGNOSIS — I481 Persistent atrial fibrillation: Secondary | ICD-10-CM | POA: Diagnosis not present

## 2018-04-20 DIAGNOSIS — E785 Hyperlipidemia, unspecified: Secondary | ICD-10-CM | POA: Diagnosis not present

## 2018-04-20 DIAGNOSIS — G4730 Sleep apnea, unspecified: Secondary | ICD-10-CM | POA: Insufficient documentation

## 2018-04-20 DIAGNOSIS — I459 Conduction disorder, unspecified: Secondary | ICD-10-CM | POA: Diagnosis not present

## 2018-04-20 DIAGNOSIS — I4819 Other persistent atrial fibrillation: Secondary | ICD-10-CM

## 2018-04-20 DIAGNOSIS — G4733 Obstructive sleep apnea (adult) (pediatric): Secondary | ICD-10-CM | POA: Diagnosis not present

## 2018-04-20 HISTORY — PX: CARDIOVERSION: SHX1299

## 2018-04-20 SURGERY — CARDIOVERSION
Anesthesia: General

## 2018-04-20 MED ORDER — SODIUM CHLORIDE 0.9 % IV SOLN
INTRAVENOUS | Status: DC
  Administered 2018-04-20: 10:00:00 via INTRAVENOUS

## 2018-04-20 MED ORDER — LIDOCAINE 2% (20 MG/ML) 5 ML SYRINGE
INTRAMUSCULAR | Status: DC | PRN
  Administered 2018-04-20: 100 mg via INTRAVENOUS

## 2018-04-20 MED ORDER — PROPOFOL 10 MG/ML IV BOLUS
INTRAVENOUS | Status: DC | PRN
  Administered 2018-04-20: 60 mg via INTRAVENOUS

## 2018-04-20 NOTE — Discharge Instructions (Signed)
Electrical Cardioversion, Care After °This sheet gives you information about how to care for yourself after your procedure. Your health care provider may also give you more specific instructions. If you have problems or questions, contact your health care provider. °What can I expect after the procedure? °After the procedure, it is common to have: °· Some redness on the skin where the shocks were given. ° °Follow these instructions at home: °· Do not drive for 24 hours if you were given a medicine to help you relax (sedative). °· Take over-the-counter and prescription medicines only as told by your health care provider. °· Ask your health care provider how to check your pulse. Check it often. °· Rest for 48 hours after the procedure or as told by your health care provider. °· Avoid or limit your caffeine use as told by your health care provider. °Contact a health care provider if: °· You feel like your heart is beating too quickly or your pulse is not regular. °· You have a serious muscle cramp that does not go away. °Get help right away if: °· You have discomfort in your chest. °· You are dizzy or you feel faint. °· You have trouble breathing or you are short of breath. °· Your speech is slurred. °· You have trouble moving an arm or leg on one side of your body. °· Your fingers or toes turn cold or blue. °This information is not intended to replace advice given to you by your health care provider. Make sure you discuss any questions you have with your health care provider. °Document Released: 06/19/2013 Document Revised: 04/01/2016 Document Reviewed: 03/04/2016 °Elsevier Interactive Patient Education © 2018 [employer] Inc[employer] ° °

## 2018-04-20 NOTE — Transfer of Care (Signed)
Immediate Anesthesia Transfer of Care Note  Patient: [name]  Procedure(s) Performed: CARDIOVERSION (N/A )  Patient Location: Endoscopy Unit  Anesthesia Type:General  Level of Consciousness: awake and alert   Airway & Oxygen Therapy: Patient Spontanous Breathing  Post-op Assessment: Report given to RN and Post -op Vital signs reviewed and stable  Post vital signs: Reviewed and stable  Last Vitals:  Vitals Value Taken Time  BP    Temp    Pulse 60 04/20/2018 11:30 AM  Resp 15 04/20/2018 11:30 AM  SpO2 92 % 04/20/2018 11:30 AM  Vitals shown include unvalidated device data.  Last Pain:  Vitals:   04/20/18 1005  TempSrc: Oral  PainSc: 0-No pain         Complications: No apparent anesthesia complications

## 2018-04-20 NOTE — Anesthesia Preprocedure Evaluation (Signed)
Anesthesia Evaluation  Patient identified by MRN, date of birth, ID band Patient awake    Reviewed: Allergy & Precautions, NPO status , Patient's Chart, lab work & pertinent test results  Airway Mallampati: I  TM Distance: >3 FB Neck ROM: Full    Dental   Pulmonary sleep apnea , former smoker,    Pulmonary exam normal        Cardiovascular hypertension, Pt. on medications + CAD  Normal cardiovascular exam+ dysrhythmias Atrial Fibrillation      Neuro/Psych    GI/Hepatic   Endo/Other    Renal/GU      Musculoskeletal   Abdominal   Peds  Hematology   Anesthesia Other Findings   Reproductive/Obstetrics                             Anesthesia Physical Anesthesia Plan  ASA: III  Anesthesia Plan: General   Post-op Pain Management:    Induction: Intravenous  PONV Risk Score and Plan: 2 and Treatment may vary due to age or medical condition  Airway Management Planned: Mask  Additional Equipment:   Intra-op Plan:   Post-operative Plan:   Informed Consent: I have reviewed the patients History and Physical, chart, labs and discussed the procedure including the risks, benefits and alternatives for the proposed anesthesia with the patient or authorized representative who has indicated his/her understanding and acceptance.     Plan Discussed with: CRNA and Surgeon  Anesthesia Plan Comments:         Anesthesia Quick Evaluation

## 2018-04-20 NOTE — Interval H&P Note (Signed)
History and Physical Interval Note:  04/20/2018 10:13 AM  [name] Bible  has presented today for surgery, with the diagnosis of AFIB  The various methods of treatment have been discussed with the patient and family. After consideration of risks, benefits and other options for treatment, the patient has consented to  Procedure(s): CARDIOVERSION (N/A) as a surgical intervention .  The patient's history has been reviewed, patient examined, no change in status, stable for surgery.  I have reviewed the patient's chart and labs.  Questions were answered to the patient's satisfaction.     Skeet Latch, MD

## 2018-04-20 NOTE — Interval H&P Note (Signed)
History and Physical Interval Note:  04/20/2018 9:47 AM  [name]  has presented today for surgery, with the diagnosis of AFIB  The various methods of treatment have been discussed with the patient and family. After consideration of risks, benefits and other options for treatment, the patient has consented to  Procedure(s): CARDIOVERSION (N/A) as a surgical intervention .  The patient's history has been reviewed, patient examined, no change in status, stable for surgery.  I have reviewed the patient's chart and labs.  Questions were answered to the patient's satisfaction.     Skeet Latch, MD

## 2018-04-20 NOTE — Anesthesia Postprocedure Evaluation (Signed)
Anesthesia Post Note  Patient: [name]  Procedure(s) Performed: CARDIOVERSION (N/A )     Patient location during evaluation: PACU Anesthesia Type: General Level of consciousness: awake and alert Pain management: pain level controlled Vital Signs Assessment: post-procedure vital signs reviewed and stable Respiratory status: spontaneous breathing, nonlabored ventilation, respiratory function stable and patient connected to nasal cannula oxygen Cardiovascular status: blood pressure returned to baseline and stable Postop Assessment: no apparent nausea or vomiting Anesthetic complications: no    Last Vitals:  Vitals:   04/20/18 1131 04/20/18 1145  BP: 117/64 117/63  Pulse: 61 (!) 59  Resp: 19 16  Temp:    SpO2: 98% 97%    Last Pain:  Vitals:   04/20/18 1145  TempSrc:   PainSc: 0-No pain                 Jann Milkovich DAVID

## 2018-04-20 NOTE — Anesthesia Procedure Notes (Signed)
Procedure Name: General with mask airway Date/Time: 04/20/2018 11:30 AM Performed by: Imagene Riches, CRNA Pre-anesthesia Checklist: Patient identified, Emergency Drugs available, Suction available and Patient being monitored Patient Re-evaluated:Patient Re-evaluated prior to induction Oxygen Delivery Method: Ambu bag Preoxygenation: Pre-oxygenation with 100% oxygen Induction Type: IV induction

## 2018-04-20 NOTE — CV Procedure (Signed)
Electrical Cardioversion Procedure Note [name] [id number] 1940/11/26  Procedure: Electrical Cardioversion Indications:  Atrial Fibrillation  Procedure Details Consent: Risks of procedure as well as the alternatives and risks of each were explained to the (patient/caregiver).  Consent for procedure obtained. Time Out: Verified patient identification, verified procedure, site/side was marked, verified correct patient position, special equipment/implants available, medications/allergies/relevent history reviewed, required imaging and test results available.  Performed  Patient placed on cardiac monitor, pulse oximetry, supplemental oxygen as necessary.  Sedation given: propofol 60 mg Pacer pads placed anterior and posterior chest.  Cardioverted 2 time(s).  Cardioverted at 150J unsuccessful.  200J successful.  Evaluation Findings: Post procedure EKG shows: atrial paced, ventricular sensed. Complications: None Patient did tolerate procedure well.   Skeet Latch, MD 04/20/2018, 11:26 AM

## 2018-04-24 ENCOUNTER — Telehealth: Payer: Self-pay

## 2018-04-24 ENCOUNTER — Telehealth (HOSPITAL_COMMUNITY): Payer: Self-pay | Admitting: *Deleted

## 2018-04-24 DIAGNOSIS — C9000 Multiple myeloma not having achieved remission: Secondary | ICD-10-CM | POA: Diagnosis not present

## 2018-04-24 DIAGNOSIS — K130 Diseases of lips: Secondary | ICD-10-CM | POA: Diagnosis not present

## 2018-04-24 DIAGNOSIS — N401 Enlarged prostate with lower urinary tract symptoms: Secondary | ICD-10-CM | POA: Diagnosis not present

## 2018-04-24 DIAGNOSIS — Z85828 Personal history of other malignant neoplasm of skin: Secondary | ICD-10-CM | POA: Diagnosis not present

## 2018-04-24 DIAGNOSIS — R310 Gross hematuria: Secondary | ICD-10-CM | POA: Diagnosis not present

## 2018-04-24 DIAGNOSIS — R7301 Impaired fasting glucose: Secondary | ICD-10-CM | POA: Diagnosis not present

## 2018-04-24 DIAGNOSIS — I480 Paroxysmal atrial fibrillation: Secondary | ICD-10-CM | POA: Diagnosis not present

## 2018-04-24 DIAGNOSIS — L720 Epidermal cyst: Secondary | ICD-10-CM | POA: Diagnosis not present

## 2018-04-24 DIAGNOSIS — D225 Melanocytic nevi of trunk: Secondary | ICD-10-CM | POA: Diagnosis not present

## 2018-04-24 DIAGNOSIS — I8390 Asymptomatic varicose veins of unspecified lower extremity: Secondary | ICD-10-CM | POA: Diagnosis not present

## 2018-04-24 DIAGNOSIS — R69 Illness, unspecified: Secondary | ICD-10-CM | POA: Diagnosis not present

## 2018-04-24 DIAGNOSIS — L821 Other seborrheic keratosis: Secondary | ICD-10-CM | POA: Diagnosis not present

## 2018-04-24 DIAGNOSIS — L570 Actinic keratosis: Secondary | ICD-10-CM | POA: Diagnosis not present

## 2018-04-24 DIAGNOSIS — I10 Essential (primary) hypertension: Secondary | ICD-10-CM | POA: Diagnosis not present

## 2018-04-24 DIAGNOSIS — M1990 Unspecified osteoarthritis, unspecified site: Secondary | ICD-10-CM | POA: Diagnosis not present

## 2018-04-24 DIAGNOSIS — I25118 Atherosclerotic heart disease of native coronary artery with other forms of angina pectoris: Secondary | ICD-10-CM | POA: Diagnosis not present

## 2018-04-24 NOTE — Telephone Encounter (Signed)
Notes on file.

## 2018-04-24 NOTE — Telephone Encounter (Signed)
Patient called in today stating he feels as though he has returned to Afib. He felt really well over the weekend after the cardioversion on Friday but Monday he woke up feeling like he did prior to cardioversion and his HR is running in the 70-80 range which he was in the 60s over the weekend. Offered pt an appointment to come in this week but patient would like to wait and be seen by Dr. Johnsie Cancel on 8/20. Pt states he will send a remote transmission in the morning to see if he has returned to AF.

## 2018-04-26 NOTE — Telephone Encounter (Signed)
40% AT/AF burden- in AF at the time of transmission 04/24/18 4:38pm. Started 04/23/18 at 2am.

## 2018-04-26 NOTE — Telephone Encounter (Signed)
LM to call back.

## 2018-04-27 NOTE — Telephone Encounter (Signed)
Informed pt of transmission. Pt stated that's what he felt had happened. He will see Dr. Johnsie Cancel next week and see what he suggests and go from there.

## 2018-04-28 NOTE — Telephone Encounter (Signed)
He has distant history of circumlfex stent needs to start tikosyn Has seen Caryl Comes in past would arrange hospital load

## 2018-04-29 NOTE — Telephone Encounter (Signed)
Noted  DC  Can you help with this?

## 2018-04-30 ENCOUNTER — Telehealth (HOSPITAL_COMMUNITY): Payer: Self-pay | Admitting: *Deleted

## 2018-04-30 NOTE — Telephone Encounter (Signed)
LMOM for pt to clbk in regards to a referral received from Dr. Johnsie Cancel to discuss Tikosyn

## 2018-04-30 NOTE — Telephone Encounter (Signed)
Thank you Donna 

## 2018-05-01 ENCOUNTER — Ambulatory Visit: Payer: Medicare HMO | Admitting: Cardiovascular Disease

## 2018-05-08 ENCOUNTER — Ambulatory Visit (HOSPITAL_COMMUNITY)
Admission: RE | Admit: 2018-05-08 | Discharge: 2018-05-08 | Disposition: A | Discharge status: Home / Self Care | Payer: Medicare HMO | Source: Ambulatory Visit | Attending: Nurse Practitioner | Admitting: Nurse Practitioner

## 2018-05-08 ENCOUNTER — Other Ambulatory Visit (HOSPITAL_COMMUNITY): Payer: Self-pay | Admitting: *Deleted

## 2018-05-08 ENCOUNTER — Encounter (HOSPITAL_COMMUNITY): Payer: Self-pay | Admitting: Nurse Practitioner

## 2018-05-08 ENCOUNTER — Telehealth: Payer: Self-pay | Admitting: Pharmacist

## 2018-05-08 VITALS — BP 126/64 | HR 70 | Ht 72.0 in | Wt 222.4 lb

## 2018-05-08 DIAGNOSIS — E785 Hyperlipidemia, unspecified: Secondary | ICD-10-CM | POA: Diagnosis not present

## 2018-05-08 DIAGNOSIS — I459 Conduction disorder, unspecified: Secondary | ICD-10-CM | POA: Diagnosis not present

## 2018-05-08 DIAGNOSIS — Z79899 Other long term (current) drug therapy: Secondary | ICD-10-CM | POA: Insufficient documentation

## 2018-05-08 DIAGNOSIS — C9000 Multiple myeloma not having achieved remission: Secondary | ICD-10-CM | POA: Insufficient documentation

## 2018-05-08 DIAGNOSIS — I2510 Atherosclerotic heart disease of native coronary artery without angina pectoris: Secondary | ICD-10-CM | POA: Insufficient documentation

## 2018-05-08 DIAGNOSIS — I10 Essential (primary) hypertension: Secondary | ICD-10-CM | POA: Diagnosis not present

## 2018-05-08 DIAGNOSIS — I4892 Unspecified atrial flutter: Secondary | ICD-10-CM | POA: Diagnosis not present

## 2018-05-08 DIAGNOSIS — G4730 Sleep apnea, unspecified: Secondary | ICD-10-CM | POA: Insufficient documentation

## 2018-05-08 DIAGNOSIS — Z7901 Long term (current) use of anticoagulants: Secondary | ICD-10-CM | POA: Insufficient documentation

## 2018-05-08 DIAGNOSIS — I481 Persistent atrial fibrillation: Secondary | ICD-10-CM | POA: Insufficient documentation

## 2018-05-08 DIAGNOSIS — Z955 Presence of coronary angioplasty implant and graft: Secondary | ICD-10-CM | POA: Diagnosis not present

## 2018-05-08 DIAGNOSIS — Z950 Presence of cardiac pacemaker: Secondary | ICD-10-CM | POA: Insufficient documentation

## 2018-05-08 DIAGNOSIS — I4819 Other persistent atrial fibrillation: Secondary | ICD-10-CM

## 2018-05-08 DIAGNOSIS — Z87891 Personal history of nicotine dependence: Secondary | ICD-10-CM | POA: Insufficient documentation

## 2018-05-08 DIAGNOSIS — I872 Venous insufficiency (chronic) (peripheral): Secondary | ICD-10-CM | POA: Diagnosis not present

## 2018-05-08 LAB — MAGNESIUM: Magnesium: 1.7 mg/dL (ref 1.7–2.4)

## 2018-05-08 MED ORDER — MAGNESIUM 200 MG PO TABS: 200.0000 mg | ORAL_TABLET | Freq: Every day | ORAL | Status: DC

## 2018-05-08 NOTE — Progress Notes (Signed)
Primary Care Physician: [name], MD Referring Physician: Dr. Grandville Silos is a 77 y.o. male with a h/o CAD, PPM for heart block,HTN, aflutter ablation that is in the afib clinic for scheduling of Tikosyn admit after Dr. Caryl Comes reviewed  paceart showing persistent afib for the last 3 months and after he had successful cardioversion but ERAF after 2 days. Dr. Johnsie Cancel referred him back to afib clinic to discuss Tikosyn.  He is on xarelto, no current missed doses. No benadryl use.    Today, he denies symptoms of palpitations, chest pain, shortness of breath, orthopnea, PND, lower extremity edema, dizziness, presyncope, syncope, or neurologic sequela.+ for fatigue in afib. The patient is tolerating medications without difficulties and is otherwise without complaint today.   Past Medical History:  Diagnosis Date  . Atrial flutter ([place])    A. s/p prior ablation;  B.  s/p CTI ablation 10/24/10 (Dr. Caryl Comes)  . CAD (coronary artery disease)    A.  s/p CFX in the past;   B.  cath 06/2010: LAD 30-40%, D1 50%, OM1 50%, AVCFX stent 30%, dCFX 70-80% (med Rx), mRCA 40%;      C.  Echo 06/2010: EF 60-65%, mod LVH; mild LAE     . Cubital tunnel syndrome   . Degenerative disc disease   . Heart block   . Hx of adenomatous colonic polyps   . Hyperlipidemia   . Hypertension   . Multiple myeloma   . Sleep apnea   . 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 Venous insufficiency    Past Surgical History:  Procedure Laterality Date  . CARDIOVERSION N/A 04/20/2018   Procedure: CARDIOVERSION;  Surgeon: Skeet Latch, MD;  Location: [facility];  Service: Cardiovascular;  Laterality: N/A;  . COLONOSCOPY  10-07-2009   TA polyp, tics, hems   . CORONARY ANGIOPLASTY WITH STENT PLACEMENT  2005  . PACEMAKER PLACEMENT  2011  . POLYPECTOMY    . Right olecranon nerve surgery    . VENTRAL HERNIA REPAIR      Current Outpatient Medications  Medication Sig Dispense Refill   . atenolol (TENORMIN) 50 MG tablet TAKE 1 TABLET BY MOUTH ONCE DAILY 90 tablet 2  . atorvastatin (LIPITOR) 10 MG tablet Take 10 mg by mouth daily.    Marland Kitchen EPINEPHrine 0.3 mg/0.3 mL IJ SOAJ injection Inject 0.3 mg into the muscle once as needed for anaphylaxis.    . finasteride (PROSCAR) 5 MG tablet Take 5 mg by mouth daily.    . Glucosamine HCl 1000 MG TABS Take 2,000 mg by mouth daily.     Marland Kitchen ketotifen (ALAWAY) 0.025 % ophthalmic solution Place 1 drop into both eyes 2 (two) times daily as needed (for dry eyes).     Marland Kitchen losartan (COZAAR) 100 MG tablet Take 100 mg by mouth daily.    . Multiple Vitamin (MULTIVITAMIN) capsule Take 1 capsule by mouth daily.      . mupirocin ointment (BACTROBAN) 2 % Apply 1 application topically 2 (two) times daily as needed (for wound care).    . nitroGLYCERIN (NITROSTAT) 0.4 MG SL tablet Place 1 tablet (0.4 mg total) under the tongue every 5 (five) minutes as needed for chest pain. Reported on 12/07/2015 25 tablet 3  . NON FORMULARY CPAP Machine at night    . Omega-3 Fatty Acids (FISH OIL) 1200 MG CAPS Take 1,200 mg by mouth daily.     Marland Kitchen thiamine 100 MG  tablet Take 100 mg by mouth daily.    . Vitamins A & D (VITAMIN A & D) ointment Apply 1 application topically as needed for dry skin.    Marland Kitchen XARELTO 20 MG TABS tablet TAKE ONE TABLET BY MOUTH ONCE DAILY WITH  SUPPER 90 tablet 3   No current facility-administered medications for this encounter.     Allergies  Allergen Reactions  . Bee Venom Anaphylaxis         Social History   Socioeconomic History  . Marital status: Divorced    Spouse name: Not on file  . Number of children: Not on file  . Years of education: Not on file  . Highest education level: Not on file  Occupational History  . Occupation: Retired from Retail banker: RETIRED  . Occupation: still farms part time  Social Needs  . Financial resource strain: Not on file  . Food insecurity:    Worry: Not on file    Inability: Not on  file  . Transportation needs:    Medical: Not on file    Non-medical: Not on file  Tobacco Use  . Smoking status: Former Smoker    Packs/day: 1.00    Years: 49.00    Pack years: 49.00    Types: Cigarettes    Last attempt to quit: 09/12/2004    Years since quitting: 13.6  . Smokeless tobacco: Former Systems developer  . Tobacco comment: started at age 48; smoked 1 ppd; quit in 2006  Substance and Sexual Activity  . Alcohol use: Yes    Alcohol/week: 28.0 standard drinks    Types: 28 Standard drinks or equivalent per week    Comment: occasional  . Drug use: No  . Sexual activity: Not Currently  Lifestyle  . Physical activity:    Days per week: Not on file    Minutes per session: Not on file  . Stress: Not on file  Relationships  . Social connections:    Talks on phone: Not on file    Gets together: Not on file    Attends religious service: Not on file    Active member of club or organization: Not on file    Attends meetings of clubs or organizations: Not on file    Relationship status: Not on file  . Intimate partner violence:    Fear of current or ex partner: Not on file    Emotionally abused: Not on file    Physically abused: Not on file    Forced sexual activity: Not on file  Other Topics Concern  . Not on file  Social History Narrative  . Not on file    Family History  Problem Relation Age of Onset  . Heart disease Father   . Cancer Father        prostate  . Heart disease Brother   . Colon cancer Neg Hx     ROS- All systems are reviewed and negative except as per the HPI above  Physical Exam: Vitals:   05/08/18 0948  BP: 126/64  Pulse: 70  Weight: 100.9 kg  Height: 6' (1.829 m)   Wt Readings from Last 3 Encounters:  05/08/18 100.9 kg  04/12/18 99.8 kg  12/18/17 101.3 kg    Labs: Lab Results  Component Value Date   NA 136 04/12/2018   K 4.1 04/12/2018   CL 98 04/12/2018   CO2 27 04/12/2018   GLUCOSE 89 04/12/2018   BUN 8 04/12/2018  CREATININE 0.70  04/12/2018   CALCIUM 9.5 04/12/2018   MG 2.4 07/01/2010   Lab Results  Component Value Date   INR 1.16 04/28/2013   Lab Results  Component Value Date   CHOL 146 10/17/2007   HDL 27.0 (L) 10/17/2007   LDLCALC 102 (H) 10/17/2007   TRIG 87 10/17/2007     GEN- The patient is well appearing, alert and oriented x 3 today.   Head- normocephalic, atraumatic Eyes-  Sclera clear, conjunctiva pink Ears- hearing intact Oropharynx- clear Neck- supple, no JVP Lymph- no cervical lymphadenopathy Lungs- Clear to ausculation bilaterally, normal work of breathing Heart- Regular rate and rhythm, no murmurs, rubs or gallops, PMI not laterally displaced GI- soft, NT, ND, + BS Extremities- no clubbing, cyanosis, or edema MS- no significant deformity or atrophy Skin- no rash or lesion Psych- euthymic mood, full affect Neuro- strength and sensation are intact  EKG-v paced rhythm at 70 bpm, qrs int 142 ms, qtc 483 ms paceart reviewed   Assessment and Plan: 1. Persistent afib- Noted on paceart, pt fatigued Successful cardioversion but had ERAF He is back now to discuss tikosyn admit  He will cut on price of drug and dates and let us know Last K+ was in range for Tikosyn, will check a magnesium today Continue atenolol without change Qtc today is 483 but is a paced rhythm  No benadryl use Drugs to be screened by pharmD  2. HTN Stable   Will schedule for tikosyn admit when pt calls back with the above info  Donna C. Carroll, [facility] [address] [place], [place] [address] [phone]

## 2018-05-08 NOTE — Telephone Encounter (Signed)
Medication list reviewed in anticipation of upcoming Tikosyn initiation. Patient is not taking any contraindicated or QTc prolonging medications. Mag today low, but started on supplementation today. Last potassium within range. No noted previous antiarrhythmic therapy, previously cardioverted on 04/20/18.   Patient is anticoagulated on Xarelto on the appropriate dose. Please ensure that patient has not missed any anticoagulation doses in the 3 weeks prior to Tikosyn initiation.   Patient will need to be counseled to avoid use of Benadryl while on Tikosyn and in the 2-3 days prior to Tikosyn initiation.

## 2018-05-28 DIAGNOSIS — R69 Illness, unspecified: Secondary | ICD-10-CM | POA: Diagnosis not present

## 2018-06-05 ENCOUNTER — Encounter (HOSPITAL_COMMUNITY): Payer: Self-pay | Admitting: General Practice

## 2018-06-05 ENCOUNTER — Inpatient Hospital Stay (HOSPITAL_COMMUNITY)
Admission: AD | Admit: 2018-06-05 | Discharge: 2018-06-08 | DRG: 308 | Disposition: A | Discharge status: Home / Self Care | Payer: Medicare HMO | Attending: Cardiology | Admitting: Cardiology

## 2018-06-05 ENCOUNTER — Other Ambulatory Visit: Payer: Self-pay

## 2018-06-05 ENCOUNTER — Ambulatory Visit (HOSPITAL_COMMUNITY)
Admission: RE | Admit: 2018-06-05 | Discharge: 2018-06-05 | Disposition: A | Discharge status: Home / Self Care | Payer: Medicare HMO | Source: Ambulatory Visit | Attending: Nurse Practitioner | Admitting: Nurse Practitioner

## 2018-06-05 ENCOUNTER — Encounter (HOSPITAL_COMMUNITY): Payer: Self-pay | Admitting: Nurse Practitioner

## 2018-06-05 VITALS — BP 148/82 | HR 70 | Ht 72.0 in | Wt 227.0 lb

## 2018-06-05 DIAGNOSIS — Z950 Presence of cardiac pacemaker: Secondary | ICD-10-CM

## 2018-06-05 DIAGNOSIS — C9000 Multiple myeloma not having achieved remission: Secondary | ICD-10-CM | POA: Diagnosis present

## 2018-06-05 DIAGNOSIS — G4733 Obstructive sleep apnea (adult) (pediatric): Secondary | ICD-10-CM | POA: Diagnosis not present

## 2018-06-05 DIAGNOSIS — Z8249 Family history of ischemic heart disease and other diseases of the circulatory system: Secondary | ICD-10-CM | POA: Diagnosis not present

## 2018-06-05 DIAGNOSIS — I5031 Acute diastolic (congestive) heart failure: Secondary | ICD-10-CM | POA: Diagnosis present

## 2018-06-05 DIAGNOSIS — Z86010 Personal history of colonic polyps: Secondary | ICD-10-CM

## 2018-06-05 DIAGNOSIS — Z87891 Personal history of nicotine dependence: Secondary | ICD-10-CM

## 2018-06-05 DIAGNOSIS — Z5181 Encounter for therapeutic drug level monitoring: Secondary | ICD-10-CM | POA: Diagnosis not present

## 2018-06-05 DIAGNOSIS — I872 Venous insufficiency (chronic) (peripheral): Secondary | ICD-10-CM | POA: Diagnosis present

## 2018-06-05 DIAGNOSIS — Z955 Presence of coronary angioplasty implant and graft: Secondary | ICD-10-CM | POA: Diagnosis not present

## 2018-06-05 DIAGNOSIS — I5033 Acute on chronic diastolic (congestive) heart failure: Secondary | ICD-10-CM | POA: Diagnosis not present

## 2018-06-05 DIAGNOSIS — E785 Hyperlipidemia, unspecified: Secondary | ICD-10-CM | POA: Diagnosis present

## 2018-06-05 DIAGNOSIS — I110 Hypertensive heart disease with heart failure: Secondary | ICD-10-CM | POA: Diagnosis present

## 2018-06-05 DIAGNOSIS — I4819 Other persistent atrial fibrillation: Secondary | ICD-10-CM

## 2018-06-05 DIAGNOSIS — Z91030 Bee allergy status: Secondary | ICD-10-CM | POA: Diagnosis not present

## 2018-06-05 DIAGNOSIS — Z7901 Long term (current) use of anticoagulants: Secondary | ICD-10-CM

## 2018-06-05 DIAGNOSIS — I481 Persistent atrial fibrillation: Principal | ICD-10-CM | POA: Diagnosis present

## 2018-06-05 DIAGNOSIS — Z79899 Other long term (current) drug therapy: Secondary | ICD-10-CM

## 2018-06-05 DIAGNOSIS — I4892 Unspecified atrial flutter: Secondary | ICD-10-CM | POA: Diagnosis present

## 2018-06-05 DIAGNOSIS — I2510 Atherosclerotic heart disease of native coronary artery without angina pectoris: Secondary | ICD-10-CM | POA: Diagnosis present

## 2018-06-05 DIAGNOSIS — I10 Essential (primary) hypertension: Secondary | ICD-10-CM | POA: Diagnosis not present

## 2018-06-05 HISTORY — DX: Unspecified hearing loss, unspecified ear: H91.90

## 2018-06-05 LAB — BASIC METABOLIC PANEL
Anion gap: 11 (ref 5–15)
BUN: 11 mg/dL (ref 8–23)
CHLORIDE: 100 mmol/L (ref 98–111)
CO2: 25 mmol/L (ref 22–32)
CREATININE: 0.7 mg/dL (ref 0.61–1.24)
Calcium: 10 mg/dL (ref 8.9–10.3)
GFR calc non Af Amer: >60 mL/min (ref >60)
GLUCOSE: 113 mg/dL — AB (ref 70–99)
Potassium: 4.4 mmol/L (ref 3.5–5.1)
Sodium: 136 mmol/L (ref 135–145)

## 2018-06-05 LAB — GLUCOSE, CAPILLARY
Glucose-Capillary: 130 mg/dL — ABNORMAL HIGH (ref 70–99)
Glucose-Capillary: 96 mg/dL (ref 70–99)
Glucose-Capillary: 85 mg/dL (ref 70–99)

## 2018-06-05 LAB — MAGNESIUM: Magnesium: 1.9 mg/dL (ref 1.7–2.4)

## 2018-06-05 MED ORDER — ATORVASTATIN CALCIUM 10 MG PO TABS
10.0000 mg | ORAL_TABLET | Freq: Every day | ORAL | Status: DC
  Administered 2018-06-06 – 2018-06-07 (×2): 10 mg via ORAL
  Filled 2018-06-05 (×2): qty 1

## 2018-06-05 MED ORDER — NITROGLYCERIN 0.4 MG SL SUBL: 0.4000 mg | SUBLINGUAL_TABLET | SUBLINGUAL | Status: DC | PRN

## 2018-06-05 MED ORDER — DOFETILIDE 500 MCG PO CAPS
500.0000 ug | ORAL_CAPSULE | Freq: Two times a day (BID) | ORAL | Status: DC
  Administered 2018-06-05 – 2018-06-06 (×2): 500 ug via ORAL
  Filled 2018-06-05 (×2): qty 1

## 2018-06-05 MED ORDER — SODIUM CHLORIDE 0.9% FLUSH
3.0000 mL | Freq: Two times a day (BID) | INTRAVENOUS | Status: DC
  Administered 2018-06-05 – 2018-06-08 (×7): 3 mL via INTRAVENOUS

## 2018-06-05 MED ORDER — GLUCOSAMINE HCL 1000 MG PO TABS: 2000.0000 mg | ORAL_TABLET | Freq: Every day | ORAL | Status: DC

## 2018-06-05 MED ORDER — VITAMIN B-1 100 MG PO TABS
100.0000 mg | ORAL_TABLET | Freq: Every day | ORAL | Status: DC
  Administered 2018-06-05 – 2018-06-08 (×4): 100 mg via ORAL
  Filled 2018-06-05 (×4): qty 1

## 2018-06-05 MED ORDER — LOSARTAN POTASSIUM 50 MG PO TABS
100.0000 mg | ORAL_TABLET | Freq: Every day | ORAL | Status: DC
  Administered 2018-06-06 – 2018-06-08 (×3): 100 mg via ORAL
  Filled 2018-06-05 (×4): qty 2

## 2018-06-05 MED ORDER — MAGNESIUM 200 MG PO TABS: 200.0000 mg | ORAL_TABLET | Freq: Every day | ORAL | Status: DC

## 2018-06-05 MED ORDER — OMEGA-3-ACID ETHYL ESTERS 1 G PO CAPS
1.0000 g | ORAL_CAPSULE | Freq: Every day | ORAL | Status: DC
  Administered 2018-06-06 – 2018-06-08 (×3): 1 g via ORAL
  Filled 2018-06-05 (×4): qty 1

## 2018-06-05 MED ORDER — KETOTIFEN FUMARATE 0.025 % OP SOLN
1.0000 [drp] | Freq: Two times a day (BID) | OPHTHALMIC | Status: DC | PRN
  Filled 2018-06-05: qty 5

## 2018-06-05 MED ORDER — ADULT MULTIVITAMIN W/MINERALS CH
1.0000 | ORAL_TABLET | Freq: Every day | ORAL | Status: DC
  Administered 2018-06-06 – 2018-06-08 (×3): 1 via ORAL
  Filled 2018-06-05 (×5): qty 1

## 2018-06-05 MED ORDER — FISH OIL 1200 MG PO CAPS: 1200.0000 mg | ORAL_CAPSULE | Freq: Every day | ORAL | Status: DC

## 2018-06-05 MED ORDER — ATENOLOL 25 MG PO TABS
50.0000 mg | ORAL_TABLET | Freq: Every day | ORAL | Status: DC
  Administered 2018-06-06 – 2018-06-08 (×3): 50 mg via ORAL
  Filled 2018-06-05 (×4): qty 2

## 2018-06-05 MED ORDER — FINASTERIDE 5 MG PO TABS
5.0000 mg | ORAL_TABLET | Freq: Every day | ORAL | Status: DC
  Administered 2018-06-06 – 2018-06-08 (×3): 5 mg via ORAL
  Filled 2018-06-05 (×4): qty 1

## 2018-06-05 MED ORDER — RIVAROXABAN 20 MG PO TABS
20.0000 mg | ORAL_TABLET | Freq: Every day | ORAL | Status: DC
  Administered 2018-06-05 – 2018-06-07 (×3): 20 mg via ORAL
  Filled 2018-06-05 (×3): qty 1

## 2018-06-05 MED ORDER — SODIUM CHLORIDE 0.9% FLUSH: 3.0000 mL | INTRAVENOUS | Status: DC | PRN

## 2018-06-05 MED ORDER — SODIUM CHLORIDE 0.9 % IV SOLN: 250.0000 mL | INTRAVENOUS | Status: DC | PRN

## 2018-06-05 NOTE — Care Management (Signed)
06-05-18 BENEFIT CHECK :  #  3.   S/W CAMERON @ [facility] # [phone] OPT- 2   1. TIKOSYN  125 MCG   [place] # [phone] FOR EXCEPTION  2. DOFETILIDE  125 MCG COVER- YES CO-PAY- $ 54.17 TIER- 4 DRUG PRIOR APPROVAL - NO  3. DOFETILIDE  250 MCG COVER- YES CO-PAY- $ 54.17 TIER- 4 DRUG PRIOR APPROVAL-NO  4. DOFETILIDE 500 MCG COVER- YES CO-PAY- $ 54.17 TIER- 4 DRUG PRIOR APPROVAL- NO  DEDUCTIBLE: MET  PREFERRED PHARMACY :  YES [facility] 90 DAY SUPPLY  FOR M/O $ 53.99

## 2018-06-05 NOTE — Progress Notes (Signed)
Pharmacy Review for Dofetilide (Tikosyn) Initiation  Admit Complaint: 77 y.o. male admitted 06/05/2018 with atrial fibrillation to be initiated on dofetilide.   Assessment:  Patient Exclusion Criteria: If any screening criteria checked as "Yes", then  patient  should NOT receive dofetilide until criteria item is corrected. If "Yes" please indicate correction plan.  YES  NO Patient  Exclusion Criteria Correction Plan  []  []  Baseline QTc interval is greater than or equal to 440 msec. IF above YES box checked dofetilide contraindicated unless patient has ICD; then may proceed if QTc 500-550 msec or with known ventricular conduction abnormalities may proceed with QTc 550-600 msec. QTc = 451 Okay to start per EP  []  [x]  Magnesium level is less than 1.8 mEq/l : Last magnesium:  Lab Results  Component Value Date   MG 1.9 06/05/2018         []  [x]  Potassium level is less than 4 mEq/l : Last potassium:  Lab Results  Component Value Date   K 4.4 06/05/2018         []  []  Patient is known or suspected to have a digoxin level greater than 2 ng/ml: No results found for: DIGOXIN    []  [x]  Creatinine clearance less than 20 ml/min (calculated using Cockcroft-Gault, actual body weight and serum creatinine): Estimated Creatinine Clearance: 94.4 mL/min (by C-G formula based on SCr of 0.7 mg/dL).    []  [x]  Patient has received drugs known to prolong the QT intervals within the last 48 hours (phenothiazines, tricyclics or tetracyclic antidepressants, erythromycin, H-1 antihistamines, cisapride, fluoroquinolones, azithromycin). Drugs not listed above may have an, as yet, undetected potential to prolong the QT interval, updated information on QT prolonging agents is available at this website:QT prolonging agents   []  [x]  Patient received a dose of hydrochlorothiazide (Oretic) alone or in any combination including triamterene (Dyazide, Maxzide) in the last 48 hours.   []  [x]  Patient received a medication  known to increase dofetilide plasma concentrations prior to initial dofetilide dose:  . Trimethoprim (Primsol, Proloprim) in the last 36 hours . Verapamil (Calan, Verelan) in the last 36 hours or a sustained release dose in the last 72 hours . Megestrol (Megace) in the last 5 days  . Cimetidine (Tagamet) in the last 6 hours . Ketoconazole (Nizoral) in the last 24 hours . Itraconazole (Sporanox) in the last 48 hours  . Prochlorperazine (Compazine) in the last 36 hours    []  [x]  Patient is known to have a history of torsades de pointes; congenital or acquired long QT syndromes.   []  [x]  Patient has received a Class 1 antiarrhythmic with less than 2 half-lives since last dose. (Disopyramide, Quinidine, Procainamide, Lidocaine, Mexiletine, Flecainide, Propafenone)   []  [x]  Patient has received amiodarone therapy in the past 3 months or amiodarone level is greater than 0.3 ng/ml.    Patient has been appropriately anticoagulated with xarelto.    Goal of Therapy: Follow renal function, electrolytes, potential drug interactions, and dose adjustment. Provide education and 1 week supply at discharge.  Plan:  [x]   Physician selected initial dose within range recommended for patients level of renal function - will monitor for response.  []   Physician selected initial dose outside of range recommended for patients level of renal function - will discuss if the dose should be altered at this time.   Select One Calculated CrCl  Dose q12h  [x]  > 60 ml/min 500 mcg  []  40-60 ml/min 250 mcg  []  20-40 ml/min 125 mcg   2.  Follow up QTc after the first 5 doses, renal function, electrolytes (K & Mg) daily x 3     days, dose adjustment, success of initiation and facilitate 1 week discharge supply as     clinically indicated.  3. Initiate Tikosyn education video (Call [phone] and ask for Tikosyn Video # 116).  Hildred Laser, PharmD Clinical Pharmacist Please check Amion for pharmacy contact number

## 2018-06-05 NOTE — Discharge Instructions (Addendum)
You have an appointment set up with the Atrial Fibrillation Clinic.  Multiple studies have shown that being followed by a dedicated atrial fibrillation clinic in addition to the standard care you receive from your other physicians improves health. We believe that enrollment in the atrial fibrillation clinic will allow us to better care for you.  ° °The phone number to the Atrial Fibrillation Clinic is [phone]. The clinic is staffed Monday through Friday from 8:30am to 5pm. ° °Parking Directions: The clinic is located in the Heart and Vascular Building connected to [facility] [place] [facility]. °1)From [address] turn on to [address] and go to the 3rd entrance  (Heart and Vascular entrance) on the right. °2)Look to the right for Heart &Vascular Parking Garage. °3)A code for the entrance is required please call the clinic to receive this.   °4)Take the elevators to the 1st floor. Registration is in the room with the glass walls at the end of the hallway. ° °If you have any trouble parking or locating the clinic, please don’t hesitate to call [phone]. ° °Information on my medicine - XARELTO® (Rivaroxaban) ° °Why was Xarelto® prescribed for you? °Xarelto® was prescribed for you to reduce the risk of a blood clot forming that can cause a stroke if you have a medical condition called atrial fibrillation (a type of irregular heartbeat). ° °What do you need to know about xarelto® ? °Take your Xarelto® ONCE DAILY at the same time every day with your evening meal. °If you have difficulty swallowing the tablet whole, you may crush it and mix in applesauce just prior to taking your dose. ° °Take Xarelto® exactly as prescribed by your doctor and DO NOT stop taking Xarelto® without talking to the doctor who prescribed the medication.  Stopping without other stroke prevention medication to take the place of Xarelto® may increase your risk of developing a clot that causes a stroke.  Refill your prescription before you  run out. ° °After discharge, you should have regular check-up appointments with your healthcare provider that is prescribing your Xarelto®.  In the future your dose may need to be changed if your kidney function or weight changes by a significant amount. ° °What do you do if you miss a dose? °If you are taking Xarelto® ONCE DAILY and you miss a dose, take it as soon as you remember on the same day then continue your regularly scheduled once daily regimen the next day. Do not take two doses of Xarelto® at the same time or on the same day.  ° °Important Safety Information °A possible side effect of Xarelto® is bleeding. You should call your healthcare provider right away if you experience any of the following: °? Bleeding from an injury or your nose that does not stop. °? Unusual colored urine (red or dark brown) or unusual colored stools (red or black). °? Unusual bruising for unknown reasons. °? A serious fall or if you hit your head (even if there is no bleeding). ° °Some medicines may interact with Xarelto® and might increase your risk of bleeding while on Xarelto®. To help avoid this, consult your healthcare provider or pharmacist prior to using any new prescription or non-prescription medications, including herbals, vitamins, non-steroidal anti-inflammatory drugs (NSAIDs) and supplements. ° °This website has more information on Xarelto®: www.xarelto.com. ° °

## 2018-06-05 NOTE — H&P (Addendum)
Cardiology Admission History and Physical:   Patient ID: [name] MRN: [id number]; DOB: Aug 12, 1941   Admission date: 06/05/2018  Primary Care Provider: [name], MD Primary Cardiologist: Dr. Johnsie Cancel Primary Electrophysiologist:  Dr. Caryl Comes  Chief Complaint:  Tikosyn initiation  Patient Profile:   [name] is a 77 y.o. male with PMHx of AFlutter (ablated in 2012), CAD, CHB w/PPM, HTN, HLD, OSA w/CPAP and AFib.  Device History: STJ dual chamber PPM implanted 2011 for complete heart block   History of Present Illness:   [name] the afib clinic for scheduling of Tikosyn admit after Dr. Caryl Comes reviewed  paceart showing persistent afib for the last 3 months with c/o fatigue and after he had successful cardioversion but ERAF after 2 days. Dr. Johnsie Cancel referred him back to afib clinic to discuss Tikosyn.  Home meds reviewed with our [facility] noting no contraindicated meds.    He arrives today for Tikosyn admission. The patient confirms no missed doses of his xarelto in >3 weeks, we discussed Tikosyn protocol, and planned DCCV on Thursday if not in SR, he remains agreeable to proceed.  Past Medical History:  Diagnosis Date  . Atrial flutter ([place])    A. s/p prior ablation;  B.  s/p CTI ablation 10/24/10 (Dr. Caryl Comes)  . CAD (coronary artery disease)    A.  s/p CFX in the past;   B.  cath 06/2010: LAD 30-40%, D1 50%, OM1 50%, AVCFX stent 30%, dCFX 70-80% (med Rx), mRCA 40%;      C.  Echo 06/2010: EF 60-65%, mod LVH; mild LAE     . Cubital tunnel syndrome   . Degenerative disc disease   . Heart block   . Hx of adenomatous colonic polyps   . Hyperlipidemia   . Hypertension   . Multiple myeloma   . Sleep apnea   . 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 Venous insufficiency     Past Surgical History:  Procedure Laterality Date  . CARDIOVERSION N/A 04/20/2018   Procedure: CARDIOVERSION;  Surgeon: Skeet Latch, MD;  Location: [facility];   Service: Cardiovascular;  Laterality: N/A;  . COLONOSCOPY  10-07-2009   TA polyp, tics, hems   . CORONARY ANGIOPLASTY WITH STENT PLACEMENT  2005  . PACEMAKER PLACEMENT  2011  . POLYPECTOMY    . Right olecranon nerve surgery    . VENTRAL HERNIA REPAIR       Medications Prior to Admission: Prior to Admission medications   Medication Sig Start Date End Date Taking? Authorizing Provider  atenolol (TENORMIN) 50 MG tablet TAKE 1 TABLET BY MOUTH ONCE DAILY 12/04/17   Josue Hector, MD  atorvastatin (LIPITOR) 10 MG tablet Take 10 mg by mouth daily.    [provider]  EPINEPHrine 0.3 mg/0.3 mL IJ SOAJ injection Inject 0.3 mg into the muscle once as needed for anaphylaxis. 04/25/17   [provider]  finasteride (PROSCAR) 5 MG tablet Take 5 mg by mouth daily.    [provider]  Glucosamine HCl 1000 MG TABS Take 2,000 mg by mouth daily.     [provider]  ketotifen (ALAWAY) 0.025 % ophthalmic solution Place 1 drop into both eyes 2 (two) times daily as needed (for dry eyes).     [provider]  losartan (COZAAR) 100 MG tablet Take 100 mg by mouth daily.    [provider]  Magnesium 200 MG TABS Take 1 tablet (200 mg total)  by mouth daily. 05/08/18   Sherran Needs, NP  Multiple Vitamin (MULTIVITAMIN) capsule Take 1 capsule by mouth daily.      [provider]  mupirocin ointment (BACTROBAN) 2 % Apply 1 application topically 2 (two) times daily as needed (for wound care).    [provider]  nitroGLYCERIN (NITROSTAT) 0.4 MG SL tablet Place 1 tablet (0.4 mg total) under the tongue every 5 (five) minutes as needed for chest pain. Reported on 12/07/2015 04/15/16   Josue Hector, MD  NON FORMULARY CPAP Machine at night    [provider]  Omega-3 Fatty Acids (FISH OIL) 1200 MG CAPS Take 1,200 mg by mouth daily.     [provider]  thiamine 100 MG tablet Take 100 mg by mouth daily.    [provider]    Vitamins A & D (VITAMIN A & D) ointment Apply 1 application topically as needed for dry skin.    [provider]  XARELTO 20 MG TABS tablet TAKE ONE TABLET BY MOUTH ONCE DAILY WITH  SUPPER 10/23/17   Josue Hector, MD     Allergies:    Allergies  Allergen Reactions  . Bee Venom Anaphylaxis         Social History:   Social History   Socioeconomic History  . Marital status: Divorced    Spouse name: Not on file  . Number of children: Not on file  . Years of education: Not on file  . Highest education level: Not on file  Occupational History  . Occupation: Retired from Retail banker: RETIRED  . Occupation: still farms part time  Social Needs  . Financial resource strain: Not on file  . Food insecurity:    Worry: Not on file    Inability: Not on file  . Transportation needs:    Medical: Not on file    Non-medical: Not on file  Tobacco Use  . Smoking status: Former Smoker    Packs/day: 1.00    Years: 49.00    Pack years: 49.00    Types: Cigarettes    Last attempt to quit: 09/12/2004    Years since quitting: 13.7  . Smokeless tobacco: Former Systems developer  . Tobacco comment: started at age 52; smoked 1 ppd; quit in 2006  Substance and Sexual Activity  . Alcohol use: Yes    Alcohol/week: 28.0 standard drinks    Types: 28 Standard drinks or equivalent per week    Comment: occasional  . Drug use: No  . Sexual activity: Not Currently  Lifestyle  . Physical activity:    Days per week: Not on file    Minutes per session: Not on file  . Stress: Not on file  Relationships  . Social connections:    Talks on phone: Not on file    Gets together: Not on file    Attends religious service: Not on file    Active member of club or organization: Not on file    Attends meetings of clubs or organizations: Not on file    Relationship status: Not on file  . Intimate partner violence:    Fear of current or ex partner: Not on file    Emotionally abused: Not on  file    Physically abused: Not on file    Forced sexual activity: Not on file  Other Topics Concern  . Not on file  Social History Narrative  . Not on file    Family  History:  The patient's family history includes Cancer in his father; Heart disease in his brother and father. There is no history of Colon cancer.    ROS:  Please see the history of present illness.  All other ROS reviewed and negative.     Physical Exam/Data:   Vitals:   06/05/18 1127  BP: (!) 143/83  Pulse: 70  Resp: 17  Temp: (!) 97.5 F (36.4 C)  TempSrc: Oral  SpO2: 97%  Weight: 102.8 kg  Height: 6' (1.829 m)   No intake or output data in the 24 hours ending 06/05/18 1147 Filed Weights   06/05/18 1127  Weight: 102.8 kg   Body mass index is 30.75 kg/m.  General:  Well nourished, well developed, in no acute distress HEENT: normal Lymph: no adenopathy Neck: no JVD Endocrine:  No thryomegaly Vascular: No carotid bruits Cardiac:  RRR (paced); no murmurs, gallops or rubs Lungs:  CTA b/l, no wheezing, rhonchi or rales  Abd: soft, nontender  Ext: no edema Musculoskeletal:  No deformities, BUE and BLE strength normal and equal Skin: warm and dry  Neuro:  No gross focal abnormalities noted Psych:  Normal affect    EKG:  The ECG that was done today was personally reviewed and demonstrates  AFib, V paced 70bpm  Relevant CV Studies:  07/02/10: TTE Study Conclusions - Left ventricle: Wall thickness was increased in a pattern of  moderate LVH. Systolic function was normal. The estimated ejection  fraction was in the range of 60% to 65%. - Left atrium: The atrium was mildly dilated.  Laboratory Data:  Chemistry Recent Labs  Lab 06/05/18 1024  NA 136  K 4.4  CL 100  CO2 25  GLUCOSE 113*  BUN 11  CREATININE 0.70  CALCIUM 10.0  GFRNONAA >60  GFRAA >60  ANIONGAP 11    No results for input(s): PROT, ALBUMIN, AST, ALT, ALKPHOS, BILITOT in the last 168 hours. HematologyNo results  for input(s): WBC, RBC, HGB, HCT, MCV, MCH, MCHC, RDW, PLT in the last 168 hours. Cardiac EnzymesNo results for input(s): TROPONINI in the last 168 hours. No results for input(s): TROPIPOC in the last 168 hours.  BNPNo results for input(s): BNP, PROBNP in the last 168 hours.  DDimer No results for input(s): DDIMER in the last 168 hours.  Radiology/Studies:  No results found.  Assessment and Plan:   1. Persistent Afib     CHA2DS2Vasc is 4, on xarelto, appropriately dosed     K+ 4.4     Mag 1.9     Creat 0.70 (Calc CrCl is 126)     QTc is borderline, paced QRS contributes some, OK to start  Update echo while here DCCV Thursday of needed  2. CAD     No anginal symptoms     Continue home meds  3. HTN     Continue home meds  4. OSA     CPAP while here    For questions or updates, please contact [place] Please consult [email or web address] for contact info under        Signed, Baldwin Jamaica, PA-C  06/05/2018 11:47 AM    I have seen, examined the patient, and reviewed the above assessment and plan.  Changes to above are made where necessary.  On exam, RRR (paced).  Reports compliance with xarelto without interruption.  Will admit for tikosyn.  Dr Caryl Comes to see in am.  Co Sign: Thompson Grayer, MD 06/05/2018 5:40 PM

## 2018-06-05 NOTE — Progress Notes (Signed)
Primary Care Physician: [name], MD Referring Physician: Dr. Weston Settle [name] is a 77 y.o. male with a h/o CAD, PPM for heart block,HTN, aflutter ablation 2012, that is in the afib clinic for scheduling of Tikosyn admit after Dr. Caryl Comes reviewed  paceart showing persistent afib for the last 3 months and after he had successful cardioversion but ERAF after 2 days. Dr. Johnsie Cancel referred him back to afib clinic to discuss Tikosyn.  He is on xarelto, no current missed doses. No benadryl use.    He is in the afib clinic 9/24 for Tikosyn admit. He confirms no  benadryl use and no missed doses of xarelto. Feels he will meet financial  assistance guidelines. He is in v paced rhythm today.  Today, he denies symptoms of palpitations, chest pain, shortness of breath, orthopnea, PND, lower extremity edema, dizziness, presyncope, syncope, or neurologic sequela.+ for fatigue in afib. The patient is tolerating medications without difficulties and is otherwise without complaint today.   Past Medical History:  Diagnosis Date  . Atrial flutter ([place])    A. s/p prior ablation;  B.  s/p CTI ablation 10/24/10 (Dr. Caryl Comes)  . CAD (coronary artery disease)    A.  s/p CFX in the past;   B.  cath 06/2010: LAD 30-40%, D1 50%, OM1 50%, AVCFX stent 30%, dCFX 70-80% (med Rx), mRCA 40%;      C.  Echo 06/2010: EF 60-65%, mod LVH; mild LAE     . Cubital tunnel syndrome   . Degenerative disc disease   . Heart block   . Hx of adenomatous colonic polyps   . Hyperlipidemia   . Hypertension   . Multiple myeloma   . Sleep apnea   . 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 Venous insufficiency    Past Surgical History:  Procedure Laterality Date  . CARDIOVERSION N/A 04/20/2018   Procedure: CARDIOVERSION;  Surgeon: Skeet Latch, MD;  Location: [facility];  Service: Cardiovascular;  Laterality: N/A;  . COLONOSCOPY  10-07-2009   TA polyp, tics, hems   . CORONARY ANGIOPLASTY WITH  STENT PLACEMENT  2005  . PACEMAKER PLACEMENT  2011  . POLYPECTOMY    . Right olecranon nerve surgery    . VENTRAL HERNIA REPAIR      No current facility-administered medications for this encounter.    No current outpatient medications on file.   Facility-Administered Medications Ordered in Other Encounters  Medication Dose Route Frequency Provider Last Rate Last Dose  . 0.9 %  sodium chloride infusion  250 mL Intravenous PRN Baldwin Jamaica, PA-C      . atenolol (TENORMIN) tablet 50 mg  50 mg Oral Daily Baldwin Jamaica, PA-C      . atorvastatin (LIPITOR) tablet 10 mg  10 mg Oral Daily Baldwin Jamaica, PA-C      . dofetilide [facility]) capsule 500 mcg  500 mcg Oral BID Baldwin Jamaica, PA-C      . finasteride (PROSCAR) tablet 5 mg  5 mg Oral Daily Baldwin Jamaica, PA-C      . Fish Oil CAPS 1,200 mg  1,200 mg Oral Daily Baldwin Jamaica, PA-C      . Glucosamine HCl TABS 2,000 mg  2,000 mg Oral Daily Baldwin Jamaica, PA-C      . ketotifen (ZADITOR) 0.025 % ophthalmic solution 1 drop  1 drop Both Eyes BID PRN Baldwin Jamaica, PA-C      .  losartan (COZAAR) tablet 100 mg  100 mg Oral Daily Baldwin Jamaica, [place]      . Magnesium TABS 200 mg  200 mg Oral Daily Baldwin Jamaica, PA-C      . multivitamin capsule 1 capsule  1 capsule Oral Daily Baldwin Jamaica, PA-C      . nitroGLYCERIN (NITROSTAT) SL tablet 0.4 mg  0.4 mg Sublingual Q5 min PRN Baldwin Jamaica, PA-C      . rivaroxaban [facility]) tablet 20 mg  20 mg Oral Q supper Baldwin Jamaica, PA-C      . sodium chloride flush (NS) 0.9 % injection 3 mL  3 mL Intravenous Q12H Baldwin Jamaica, PA-C      . sodium chloride flush (NS) 0.9 % injection 3 mL  3 mL Intravenous PRN Baldwin Jamaica, PA-C      . thiamine tablet 100 mg  100 mg Oral Daily Baldwin Jamaica, PA-C        Allergies  Allergen Reactions  . Bee Venom Anaphylaxis         Social History   Socioeconomic History  . Marital status: Divorced     Spouse name: Not on file  . Number of children: Not on file  . Years of education: Not on file  . Highest education level: Not on file  Occupational History  . Occupation: Retired from Retail banker: RETIRED  . Occupation: still farms part time  Social Needs  . Financial resource strain: Not on file  . Food insecurity:    Worry: Not on file    Inability: Not on file  . Transportation needs:    Medical: Not on file    Non-medical: Not on file  Tobacco Use  . Smoking status: Former Smoker    Packs/day: 1.00    Years: 49.00    Pack years: 49.00    Types: Cigarettes    Last attempt to quit: 09/12/2004    Years since quitting: 13.7  . Smokeless tobacco: Former Systems developer  . Tobacco comment: started at age 88; smoked 1 ppd; quit in 2006  Substance and Sexual Activity  . Alcohol use: Yes    Alcohol/week: 28.0 standard drinks    Types: 28 Standard drinks or equivalent per week    Comment: occasional  . Drug use: No  . Sexual activity: Not Currently  Lifestyle  . Physical activity:    Days per week: Not on file    Minutes per session: Not on file  . Stress: Not on file  Relationships  . Social connections:    Talks on phone: Not on file    Gets together: Not on file    Attends religious service: Not on file    Active member of club or organization: Not on file    Attends meetings of clubs or organizations: Not on file    Relationship status: Not on file  . Intimate partner violence:    Fear of current or ex partner: Not on file    Emotionally abused: Not on file    Physically abused: Not on file    Forced sexual activity: Not on file  Other Topics Concern  . Not on file  Social History Narrative  . Not on file    Family History  Problem Relation Age of Onset  . Heart disease Father   . Cancer Father        prostate  . Heart disease Brother   .  Colon cancer Neg Hx     ROS- All systems are reviewed and negative except as per the HPI above  Physical  Exam: Vitals:   06/05/18 0956  BP: (!) 148/82  Pulse: 70  Weight: 103 kg  Height: 6' (1.829 m)   Wt Readings from Last 3 Encounters:  06/05/18 102.8 kg  06/05/18 103 kg  05/08/18 100.9 kg    Labs: Lab Results  Component Value Date   NA 136 06/05/2018   K 4.4 06/05/2018   CL 100 06/05/2018   CO2 25 06/05/2018   GLUCOSE 113 (H) 06/05/2018   BUN 11 06/05/2018   CREATININE 0.70 06/05/2018   CALCIUM 10.0 06/05/2018   MG 1.9 06/05/2018   Lab Results  Component Value Date   INR 1.16 04/28/2013   Lab Results  Component Value Date   CHOL 146 10/17/2007   HDL 27.0 (L) 10/17/2007   LDLCALC 102 (H) 10/17/2007   TRIG 87 10/17/2007     GEN- The patient is well appearing, alert and oriented x 3 today.   Head- normocephalic, atraumatic Eyes-  Sclera clear, conjunctiva pink Ears- hearing intact Oropharynx- clear Neck- supple, no JVP Lymph- no cervical lymphadenopathy Lungs- Clear to ausculation bilaterally, normal work of breathing Heart- Regular rate and rhythm, no murmurs, rubs or gallops, PMI not laterally displaced GI- soft, NT, ND, + BS Extremities- no clubbing, cyanosis, or edema MS- no significant deformity or atrophy Skin- no rash or lesion Psych- euthymic mood, full affect Neuro- strength and sensation are intact  EKG-v paced rhythm at 70 bpm, qrs int 142 ms, qtc 483 ms paceart reviewed   Assessment and Plan: 1. Persistent afib- Noted on paceart, pt fatigued Successful cardioversion but had ERAF He is now back for tikosyn admit  He will meet financial guidelines to get drug free bmet/mag Continue atenolol without change Qtc today is 451 ms, is a paced rhythm  No benadryl use Drugs screened by pharmD and is not on any qtc prolonging drugs  2. HTN Stable   3. Chadsvasc score of at least 4 Continue xarelto 20 mg daily States no missed doses  To 6 E   Deja Kaigler C. Haifa Hatton, [facility] [address] [place], [place]  [address] [phone]

## 2018-06-06 ENCOUNTER — Telehealth (HOSPITAL_COMMUNITY): Payer: Self-pay | Admitting: *Deleted

## 2018-06-06 ENCOUNTER — Inpatient Hospital Stay (HOSPITAL_COMMUNITY): Payer: Medicare HMO

## 2018-06-06 ENCOUNTER — Telehealth: Payer: Self-pay | Admitting: Internal Medicine

## 2018-06-06 LAB — BASIC METABOLIC PANEL
Anion gap: 11 (ref 5–15)
BUN: 9 mg/dL (ref 8–23)
CO2: 24 mmol/L (ref 22–32)
CREATININE: 0.62 mg/dL (ref 0.61–1.24)
Calcium: 9.1 mg/dL (ref 8.9–10.3)
Chloride: 100 mmol/L (ref 98–111)
GFR calc Af Amer: >60 mL/min (ref >60)
GLUCOSE: 111 mg/dL — AB (ref 70–99)
POTASSIUM: 3.6 mmol/L (ref 3.5–5.1)
SODIUM: 135 mmol/L (ref 135–145)

## 2018-06-06 LAB — GLUCOSE, CAPILLARY
Glucose-Capillary: 132 mg/dL — ABNORMAL HIGH (ref 70–99)
Glucose-Capillary: 97 mg/dL (ref 70–99)
Glucose-Capillary: 104 mg/dL — ABNORMAL HIGH (ref 70–99)
Glucose-Capillary: 88 mg/dL (ref 70–99)

## 2018-06-06 LAB — MAGNESIUM: MAGNESIUM: 1.8 mg/dL (ref 1.7–2.4)

## 2018-06-06 MED ORDER — MAGNESIUM SULFATE IN D5W 1-5 GM/100ML-% IV SOLN
1.0000 g | Freq: Once | INTRAVENOUS | Status: AC
  Administered 2018-06-06: 1 g via INTRAVENOUS
  Filled 2018-06-06: qty 100

## 2018-06-06 MED ORDER — POTASSIUM CHLORIDE CRYS ER 20 MEQ PO TBCR
40.0000 meq | EXTENDED_RELEASE_TABLET | Freq: Once | ORAL | Status: AC
  Administered 2018-06-06: 40 meq via ORAL
  Filled 2018-06-06: qty 2

## 2018-06-06 MED ORDER — DOFETILIDE 250 MCG PO CAPS
250.0000 ug | ORAL_CAPSULE | Freq: Two times a day (BID) | ORAL | Status: DC
  Administered 2018-06-06 – 2018-06-07 (×3): 250 ug via ORAL
  Filled 2018-06-06 (×3): qty 1

## 2018-06-06 MED ORDER — FUROSEMIDE 10 MG/ML IJ SOLN
40.0000 mg | Freq: Once | INTRAMUSCULAR | Status: AC
  Administered 2018-06-06: 40 mg via INTRAVENOUS
  Filled 2018-06-06: qty 4

## 2018-06-06 MED ORDER — POTASSIUM CHLORIDE CRYS ER 20 MEQ PO TBCR: 20.0000 meq | EXTENDED_RELEASE_TABLET | Freq: Every day | ORAL | Status: DC

## 2018-06-06 NOTE — Telephone Encounter (Signed)
Patient approved for tier exception for dofetilide through 09/11/18. Approval #[id number]

## 2018-06-06 NOTE — Progress Notes (Addendum)
Progress Note  Patient Name: [name] Date of Encounter: 06/06/2018  Primary Cardiologist: Dr. Johnsie Cancel  Subjective   Slept poorly, interruptions  No CP, SOB, or palpitations.  He knew he had gone in SR by his HR on the monitor.  Inpatient Medications    Scheduled Meds: . atenolol  50 mg Oral Daily  . atorvastatin  10 mg Oral q1800  . dofetilide  500 mcg Oral BID  . finasteride  5 mg Oral Daily  . losartan  100 mg Oral Daily  . multivitamin with minerals  1 tablet Oral Daily  . omega-3 acid ethyl esters  1 g Oral Daily  . potassium chloride  40 mEq Oral Once  . rivaroxaban  20 mg Oral Q supper  . sodium chloride flush  3 mL Intravenous Q12H  . thiamine  100 mg Oral Daily   Continuous Infusions: . sodium chloride    . magnesium sulfate 1 - 4 g bolus IVPB     PRN Meds: sodium chloride, ketotifen, nitroGLYCERIN, sodium chloride flush   Vital Signs    Vitals:   06/05/18 1127 06/05/18 2046 06/06/18 0616  BP: (!) 143/83 138/71 (!) 130/59  Pulse: 70 69 60  Resp: 17 16 18   Temp: (!) 97.5 F (36.4 C) 97.8 F (36.6 C) 97.9 F (36.6 C)  TempSrc: Oral Oral Oral  SpO2: 97% 94% 93%  Weight: 102.8 kg  101.1 kg  Height: 6' (1.829 m)      Intake/Output Summary (Last 24 hours) at 06/06/2018 0838 Last data filed at 06/06/2018 0602 Gross per 24 hour  Intake 723 ml  Output 350 ml  Net 373 ml   Filed Weights   06/05/18 1127 06/06/18 0616  Weight: 102.8 kg 101.1 kg    Telemetry    AF/VP >> AV paced - Personally Reviewed  ECG    Post dose EKG last PM is AF, VP, QTc 514 AV paced this AM, QTc 447ms  Personally Reviewed  Physical Exam   GEN: No acute distress.   Neck: No JVD Cardiac: RRR, no murmurs, rubs, or gallops.  Respiratory: CTA b/l. GI: Soft, nontender, non-distended  MS: 3+ wrapped edema;age appropriate atrophy. Neuro:  Nonfocal  Psych: Normal affect   Labs    Chemistry Recent Labs  Lab 06/05/18 1024 06/06/18 0421  NA 136 135  K 4.4 3.6    CL 100 100  CO2 25 24  GLUCOSE 113* 111*  BUN 11 9  CREATININE 0.70 0.62  CALCIUM 10.0 9.1  GFRNONAA >60 >60  GFRAA >60 >60  ANIONGAP 11 11     HematologyNo results for input(s): WBC, RBC, HGB, HCT, MCV, MCH, MCHC, RDW, PLT in the last 168 hours.  Cardiac EnzymesNo results for input(s): TROPONINI in the last 168 hours. No results for input(s): TROPIPOC in the last 168 hours.   BNPNo results for input(s): BNP, PROBNP in the last 168 hours.   DDimer No results for input(s): DDIMER in the last 168 hours.   Radiology   + No results found.  Cardiac Studies   07/02/10: TTE Study Conclusions - Left ventricle: Wall thickness was increased in a pattern of  moderate LVH. Systolic function was normal. The estimated ejection  fraction was in the range of 60% to 65%. - Left atrium: The atrium was mildly dilated.  Patient Profile     77 y.o. male with PMHx of AFlutter (ablated in 2012), CAD, CHB w/PPM, HTN, HLD, OSA w/CPAP and AFib admitted for Tikosyn  initiation  Device History: STJ dual chamberPPM implanted2031for complete heart block  Assessment & Plan    1. Persistent Afib     CHA2DS2Vasc is 4, on xarelto, appropriately dosed     K+ 3.6, replacement ordered     Mag 1.8 replacement ordered     Creat 0.62, stable     EKGs were reviewed with Dr. Caryl Comes, QTc this AM in SR is OK to continue  Update echo while here, ordered  2. CAD     No anginal symptoms     Continue home meds  3. HTN     Continue home meds  4. OSA     CPAP while here  For questions or updates, please contact [place] Please consult [email or web address] for contact info under        Signed, Baldwin Jamaica, PA-C  06/06/2018, 8:38 AM    Feels better in sinus  Patient reverted to sinus rhythm with dofetilide.  Anticipate discharge Friday.  Aggressive potassium repletion and will make it daily.  BP reasonably controlled   qT borderline long  Will decreasae dosse  Still SOB   Will give dose of Lasix x !;  Severe venous insufficiency so hard to assess volume status

## 2018-06-06 NOTE — Plan of Care (Signed)
  Problem: Activity: Goal: Risk for activity intolerance will decrease Outcome: Progressing   

## 2018-06-06 NOTE — Telephone Encounter (Signed)
MM PAL 10/14 - moved appointments to 10/23. Other appointments remain the same. Left message. Schedule mailed.

## 2018-06-06 NOTE — Progress Notes (Signed)
Pt set up on CPAP for the night with a full face mask.  A cushion dressing applied to nose per patient request prior to placement of mask.  Pt tolerating well at time of set up.

## 2018-06-06 NOTE — Care Management Note (Signed)
Case Management Note  Patient Details  Name: [name] MRN: [id number] Date of Birth: 10-14-1940  Subjective/Objective: Pt presented for Tikosyn Load! PTA Independent from home with friend. Patient uses [facility] in the home.                    Action/Plan: Benefits Check completed for Tikosyn and patient is aware of cost. Pt uses [facility]. Patient sates the [facility] has patient assistance application in process. CM did make patient aware that he would get Rx for 7 day supply before leaving the hospital. No further needs from CM at this time.   Expected Discharge Date:                  Expected Discharge Plan:  Home/Self Care  In-House Referral:  NA  Discharge planning Services  CM Consult  Post Acute Care Choice:  NA Choice offered to:  NA  DME Arranged:  N/A DME Agency:  NA  HH Arranged:  NA HH Agency:  NA  Status of Service:  Completed, signed off  If discussed at [facility] of Stay Meetings, dates discussed:    Additional Comments:  Bethena Roys, RN 06/06/2018, 4:09 PM

## 2018-06-07 ENCOUNTER — Encounter (HOSPITAL_COMMUNITY)
Admission: AD | Disposition: A | Discharge status: Home / Self Care | Payer: Self-pay | Source: Home / Self Care | Attending: Internal Medicine

## 2018-06-07 ENCOUNTER — Inpatient Hospital Stay (HOSPITAL_COMMUNITY): Payer: Medicare HMO

## 2018-06-07 ENCOUNTER — Ambulatory Visit (HOSPITAL_COMMUNITY): Admission: RE | Admit: 2018-06-07 | Payer: Medicare HMO | Source: Ambulatory Visit | Admitting: Cardiovascular Disease

## 2018-06-07 DIAGNOSIS — I5033 Acute on chronic diastolic (congestive) heart failure: Secondary | ICD-10-CM

## 2018-06-07 DIAGNOSIS — I10 Essential (primary) hypertension: Secondary | ICD-10-CM

## 2018-06-07 DIAGNOSIS — I481 Persistent atrial fibrillation: Secondary | ICD-10-CM

## 2018-06-07 LAB — MAGNESIUM: Magnesium: 1.9 mg/dL (ref 1.7–2.4)

## 2018-06-07 LAB — BASIC METABOLIC PANEL
Anion gap: 8 (ref 5–15)
BUN: 7 mg/dL — AB (ref 8–23)
CHLORIDE: 98 mmol/L (ref 98–111)
CO2: 28 mmol/L (ref 22–32)
Calcium: 9.4 mg/dL (ref 8.9–10.3)
Creatinine, Ser: 0.62 mg/dL (ref 0.61–1.24)
GFR calc Af Amer: >60 mL/min (ref >60)
GFR calc non Af Amer: >60 mL/min (ref >60)
GLUCOSE: 106 mg/dL — AB (ref 70–99)
Potassium: 3.5 mmol/L (ref 3.5–5.1)
Sodium: 134 mmol/L — ABNORMAL LOW (ref 135–145)

## 2018-06-07 LAB — ECHOCARDIOGRAM COMPLETE
HEIGHTINCHES: 72 in
WEIGHTICAEL: 3467.2 [oz_av]

## 2018-06-07 LAB — GLUCOSE, CAPILLARY
GLUCOSE-CAPILLARY: 99 mg/dL (ref 70–99)
Glucose-Capillary: 75 mg/dL (ref 70–99)
Glucose-Capillary: 83 mg/dL (ref 70–99)

## 2018-06-07 SURGERY — CARDIOVERSION
Anesthesia: General

## 2018-06-07 MED ORDER — POTASSIUM CHLORIDE CRYS ER 20 MEQ PO TBCR
40.0000 meq | EXTENDED_RELEASE_TABLET | ORAL | Status: AC
  Administered 2018-06-07 (×2): 40 meq via ORAL
  Filled 2018-06-07 (×2): qty 2

## 2018-06-07 MED ORDER — FUROSEMIDE 40 MG PO TABS
40.0000 mg | ORAL_TABLET | Freq: Every day | ORAL | Status: DC
  Administered 2018-06-07 – 2018-06-08 (×2): 40 mg via ORAL
  Filled 2018-06-07 (×2): qty 1

## 2018-06-07 MED ORDER — MAGNESIUM SULFATE IN D5W 1-5 GM/100ML-% IV SOLN
1.0000 g | Freq: Once | INTRAVENOUS | Status: AC
  Administered 2018-06-07: 1 g via INTRAVENOUS
  Filled 2018-06-07 (×2): qty 100

## 2018-06-07 NOTE — Progress Notes (Signed)
Placed patient on CPAP for the night via auto-mode.  

## 2018-06-07 NOTE — Progress Notes (Signed)
  Echocardiogram 2D Echocardiogram has been performed.  [name] 06/07/2018, 12:21 PM

## 2018-06-07 NOTE — Progress Notes (Addendum)
Progress Note  Patient Name: [name] Date of Encounter: 06/07/2018  Primary Cardiologist: Dr. Johnsie Cancel  Subjective   Feels better today, no SOB, ambulating in the hall this AM without difficulty  Inpatient Medications    Scheduled Meds: . atenolol  50 mg Oral Daily  . atorvastatin  10 mg Oral q1800  . dofetilide  250 mcg Oral BID  . finasteride  5 mg Oral Daily  . furosemide  40 mg Oral Daily  . losartan  100 mg Oral Daily  . multivitamin with minerals  1 tablet Oral Daily  . omega-3 acid ethyl esters  1 g Oral Daily  . potassium chloride  40 mEq Oral Q3H  . rivaroxaban  20 mg Oral Q supper  . sodium chloride flush  3 mL Intravenous Q12H  . thiamine  100 mg Oral Daily   Continuous Infusions: . sodium chloride    . magnesium sulfate 1 - 4 g bolus IVPB     PRN Meds: sodium chloride, ketotifen, nitroGLYCERIN, sodium chloride flush   Vital Signs    Vitals:   06/06/18 1258 06/06/18 2112 06/07/18 0527 06/07/18 0527  BP: (!) 165/82 (!) 147/82  133/68  Pulse: 68 71  (!) 59  Resp:      Temp: 97.6 F (36.4 C) 98.1 F (36.7 C)  97.9 F (36.6 C)  TempSrc: Oral Oral  Oral  SpO2: 94% 94%  93%  Weight:   98.3 kg   Height:        Intake/Output Summary (Last 24 hours) at 06/07/2018 0810 Last data filed at 06/06/2018 2200 Gross per 24 hour  Intake 943 ml  Output -  Net 943 ml   Filed Weights   06/05/18 1127 06/06/18 0616 06/07/18 0527  Weight: 102.8 kg 101.1 kg 98.3 kg    Telemetry     AV paced - Personally Reviewed  ECG    AV paced, reviewed with Dr. Caryl Comes, QT improved, OK for 217mcg dose this AM Personally Reviewed  Physical Exam   GEN: No acute distress.   Neck: No JVD Cardiac: RRR, no murmurs, rubs, or gallops.  Respiratory: CTA b/l. GI: Soft, nontender, non-distended  MS: compression stockings, edema is less, age appropriate atrophy. Neuro:  Nonfocal  Psych: Normal affect   Labs    Chemistry Recent Labs  Lab 06/05/18 1024 06/06/18 0421  06/07/18 0358  NA 136 135 134*  K 4.4 3.6 3.5  CL 100 100 98  CO2 25 24 28   GLUCOSE 113* 111* 106*  BUN 11 9 7*  CREATININE 0.70 0.62 0.62  CALCIUM 10.0 9.1 9.4  GFRNONAA >60 >60 >60  GFRAA >60 >60 >60  ANIONGAP 11 11 8      HematologyNo results for input(s): WBC, RBC, HGB, HCT, MCV, MCH, MCHC, RDW, PLT in the last 168 hours.  Cardiac EnzymesNo results for input(s): TROPONINI in the last 168 hours. No results for input(s): TROPIPOC in the last 168 hours.   BNPNo results for input(s): BNP, PROBNP in the last 168 hours.   DDimer No results for input(s): DDIMER in the last 168 hours.   Radiology    No results found.  Cardiac Studies   07/02/10: TTE Study Conclusions - Left ventricle: Wall thickness was increased in a pattern of  moderate LVH. Systolic function was normal. The estimated ejection  fraction was in the range of 60% to 65%. - Left atrium: The atrium was mildly dilated.  Patient Profile     77 y.o. male  with PMHx of AFlutter (ablated in 2012), CAD, CHB w/PPM, HTN, HLD, OSA w/CPAP and AFib admitted for Tikosyn initiation  Device History: STJ dual chamberPPM implanted2046for complete heart block  Assessment & Plan    1. Persistent Afib     CHA2DS2Vasc is 4, on xarelto, appropriately dosed     K+ 3.4, replacement ordered, will need daily rx     Mag 1.9 replacement ordered     Creat 0.62, stable     EKG were reviewed with Dr. Caryl Comes, QTc this AM is shorter, continue 290mcg dosing  Update echo while here, ordered, is pending  Anticipate discharge tomorrow  2. CAD     No anginal symptoms     Continue home meds  3. HTN     Continue home meds  4. OSA     CPAP while here  5.  Congestive heart failure-chronic-acute diastolic complicated by     Chronic venous insufficiency     Echo ordered is pending     S/p IV lasix with much improvement     PO lasix daily w/K+    For questions or updates, please contact [facility] Please  consult [email or web address] for contact info under        Signed, Baldwin Jamaica, PA-C  06/07/2018, 8:10 AM    Much improved following diuresis. We will continue oral Lasix.  QT interval extended significantly yesterday following conversion to sinus rhythm.  Dofetilide dose decreased 500-250.  QTc within range this morning.  Require potassium repletion.

## 2018-06-07 NOTE — Plan of Care (Signed)
Monitor QT/Qtc interval per Tikosyn protocol.

## 2018-06-07 NOTE — Progress Notes (Signed)
Post dose EKG is reviewed AV paced 61bpm Measured QT is 512ms, QRS duration 161ms Given prolonged/paced QRS duration, QTc is 459ms, OK to proceed.  Tommye Standard, PA-C

## 2018-06-08 DIAGNOSIS — Z79899 Other long term (current) drug therapy: Secondary | ICD-10-CM

## 2018-06-08 DIAGNOSIS — Z5181 Encounter for therapeutic drug level monitoring: Secondary | ICD-10-CM

## 2018-06-08 LAB — MAGNESIUM: Magnesium: 2 mg/dL (ref 1.7–2.4)

## 2018-06-08 LAB — BASIC METABOLIC PANEL
ANION GAP: 9 (ref 5–15)
BUN: 10 mg/dL (ref 8–23)
CHLORIDE: 101 mmol/L (ref 98–111)
CO2: 24 mmol/L (ref 22–32)
Calcium: 9.1 mg/dL (ref 8.9–10.3)
Creatinine, Ser: 0.64 mg/dL (ref 0.61–1.24)
GFR calc Af Amer: >60 mL/min (ref >60)
GFR calc non Af Amer: >60 mL/min (ref >60)
Glucose, Bld: 100 mg/dL — ABNORMAL HIGH (ref 70–99)
POTASSIUM: 3.8 mmol/L (ref 3.5–5.1)
Sodium: 134 mmol/L — ABNORMAL LOW (ref 135–145)

## 2018-06-08 MED ORDER — POTASSIUM CHLORIDE CRYS ER 20 MEQ PO TBCR
40.0000 meq | EXTENDED_RELEASE_TABLET | Freq: Once | ORAL | Status: AC
  Administered 2018-06-08: 40 meq via ORAL
  Filled 2018-06-08: qty 2

## 2018-06-08 MED ORDER — DOFETILIDE 125 MCG PO CAPS: 125.0000 ug | ORAL_CAPSULE | Freq: Two times a day (BID) | ORAL | 6 refills | Status: DC

## 2018-06-08 MED ORDER — FUROSEMIDE 40 MG PO TABS: 40.0000 mg | ORAL_TABLET | Freq: Every day | ORAL | 6 refills | Status: DC

## 2018-06-08 MED ORDER — POTASSIUM CHLORIDE ER 20 MEQ PO TBCR: 20.0000 meq | EXTENDED_RELEASE_TABLET | Freq: Every day | ORAL | 3 refills | Status: DC

## 2018-06-08 MED ORDER — DOFETILIDE 125 MCG PO CAPS
125.0000 ug | ORAL_CAPSULE | Freq: Two times a day (BID) | ORAL | Status: DC
  Administered 2018-06-08: 125 ug via ORAL
  Filled 2018-06-08: qty 1

## 2018-06-08 NOTE — Progress Notes (Addendum)
Progress Note  Patient Name: [name] Date of Encounter: 06/08/2018  Primary Cardiologist: Dr. Johnsie [name]  Subjective   Feels better today, no SOB, ambulating in his room this AM without difficulty, hoping to go home  Inpatient Medications    Scheduled Meds: . atenolol  50 mg Oral Daily  . atorvastatin  10 mg Oral q1800  . dofetilide  125 mcg Oral BID  . finasteride  5 mg Oral Daily  . furosemide  40 mg Oral Daily  . losartan  100 mg Oral Daily  . multivitamin with minerals  1 tablet Oral Daily  . omega-3 acid ethyl esters  1 g Oral Daily  . rivaroxaban  20 mg Oral Q supper  . sodium chloride flush  3 mL Intravenous Q12H  . thiamine  100 mg Oral Daily   Continuous Infusions: . sodium chloride     PRN Meds: sodium chloride, ketotifen, nitroGLYCERIN, sodium chloride flush   Vital Signs    Vitals:   06/07/18 2208 06/07/18 2234 06/08/18 0602 06/08/18 0801  BP: 139/84  133/80 133/76  Pulse: 68 72 78 68  Resp:  19    Temp: 97.7 F (36.5 C)  97.8 F (36.6 C)   TempSrc: Oral  Oral   SpO2: 96% 96% 96%   Weight:   98 kg   Height:        Intake/Output Summary (Last 24 hours) at 06/08/2018 0847 Last data filed at 06/08/2018 0804 Gross per 24 hour  Intake 966 ml  Output -  Net 966 ml   Filed Weights   06/06/18 0616 06/07/18 0527 06/08/18 0602  Weight: 101.1 kg 98.3 kg 98 kg    Telemetry     AV paced, rare PVC, couplet - Personally Reviewed  ECG    AV paced, reviewed with Dr.Deira [name], QT a little long Personally Reviewed  Physical Exam   GEN: No acute distress.   Neck: No JVD Cardiac: RRR, no murmurs, rubs, or gallops.  Respiratory: CTA b/l. GI: Soft, nontender, non-distended  MS: compression stockings, edema is less, age appropriate atrophy. Neuro:  Nonfocal  Psych: Normal affect   Labs    Chemistry Recent Labs  Lab 06/06/18 0421 06/07/18 0358 06/08/18 0425  NA 135 134* 134*  K 3.6 3.5 3.8  CL 100 98 101  CO2 24 28 24   GLUCOSE 111* 106*  100*  BUN 9 7* 10  CREATININE 0.62 0.62 0.64  CALCIUM 9.1 9.4 9.1  GFRNONAA >60 >60 >60  GFRAA >60 >60 >60  ANIONGAP 11 8 9      HematologyNo results for input(s): WBC, RBC, HGB, HCT, MCV, MCH, MCHC, RDW, PLT in the last 168 hours.  Cardiac EnzymesNo results for input(s): TROPONINI in the last 168 hours. No results for input(s): TROPIPOC in the last 168 hours.   BNPNo results for input(s): BNP, PROBNP in the last 168 hours.   DDimer No results for input(s): DDIMER in the last 168 hours.   Radiology    No results found.  Cardiac Studies   06/07/18: TTE Study Conclusions - Left ventricle: The cavity size was normal. Systolic function was   normal. Wall motion was normal; there were no regional wall   motion abnormalities. - Aortic valve: Valve area (VTI): 2.92 cm^2. Valve area (Vmax):   3.09 cm^2. Valve area (Vmean): 2.94 cm^2. - Pulmonary arteries: PA peak pressure: 47 mm Hg (S).   07/02/10: TTE Study Conclusions - Left ventricle: Wall thickness was increased in a pattern of  moderate LVH. Systolic function was normal. The estimated ejection  fraction was in the range of 60% to 65%. - Left atrium: The atrium was mildly dilated.  Patient Profile     77 y.o. male with PMHx of AFlutter (ablated in 2012), CAD, CHB w/PPM, HTN, HLD, OSA w/CPAP and AFib admitted for Tikosyn initiation  Device History: STJ dual chamberPPM implanted2021for complete heart block  Assessment & Plan    1. Persistent Afib     CHA2DS2Vasc is 4, on xarelto, appropriately dosed     K+ 3.8, replacement ordered, [name] need daily rx     Mag 2.0 replacement ordered     Creat 0.64, stable     EKG reviewed with Dr. Curt [name], QT a bit long, [name] further reduce Tikosyn to 139mcg this AM     Discussed with pt, potential need to stay today, or may not be able to keep drug if QT remains long      Maintaining SR  2. CAD     No anginal symptoms     Continue home meds  3. HTN     Continue  home meds  4. OSA     CPAP while here     PA pressure 47 on echo  5.  Congestive heart failure-chronic-acute diastolic complicated by     Chronic venous insufficiency     Echo with normal LV systolic function, no DD described, no VHD     S/p IV lasix with much improvement     PO lasix daily w/K+    For questions or updates, please contact [name] Please consult [email or web address] for contact info under        Signed, [name] Jamaica, PA-C  06/08/2018, 8:47 AM    I have seen and examined this patient with [name].  Agree with above, note added to reflect my findings.  On exam, RRR, no murmurs, lungs clear.  Admitted to the hospital for dofetilide loading.  QTC remains prolonged.  We [name] decrease the dose to 125 mcg.  [name] potentially discharge later today if his QTC is improved.  [name] [place] M. Gordon Carlson MD 06/08/2018 11:08 AM

## 2018-06-08 NOTE — Discharge Summary (Addendum)
ELECTROPHYSIOLOGY PROCEDURE DISCHARGE SUMMARY    Patient ID: [name],  MRN: [id number], DOB/AGE: 1941/02/07 77 y.o.  Admit date: 06/05/2018 Discharge date: 06/08/2018  Primary Care Physician: [name], MD  Primary Cardiologist: Dr. Johnsie Cancel Electrophysiologist: Dr. Caryl Comes  Primary Discharge Diagnosis:  1.  Persistent atrial fibrillation status post Tikosyn loading this admission      CHA2DS2Vasc is 4, on Xarelto 2. Chronic venous insufficiency  Secondary Discharge Diagnosis:  1. CAD 2. CHB w/PPM 3. HTN 4. OSA  Allergies  Allergen Reactions  . Bee Venom Anaphylaxis          Procedures This Admission:  1.  Tikosyn loading   Brief HPI: [name] is a 77 y.o. male with a past medical history as noted above.  He is followed out patient by EP.  With increasing and persistent AF burden planned for Tikosyn initiation.  Risks, benefits, and alternatives to Tikosyn were reviewed with the patient who wished to proceed.    Hospital Course:  The patient was admitted and Tikosyn was initiated.  Renal function and electrolytes were followed during the hospitalization. He has chronic venous insufficiency and started on lasix, he will be sent home with potassium replacement as well, to continue his home mag replacement.  His QT did prolong and required down-titration, after this morning dose of 144mcg, post dose EKGs were reviewed by Dr. Curt Bears with improvement of his QT and acceptable for discharge on the lower dose.  He converted to SR with drug and did not require DCCV.  The patient was monitored until discharge on telemetry which demonstrated AV paced rhythm.  On the day of discharge,he was examined by Dr Curt Bears who considered the patient stable for discharge to home.  Follow-up has been arranged with the AFib clinic in 1 week and with Dr Caryl Comes in 4 weeks.   Physical Exam: Vitals:   06/07/18 2234 06/08/18 0602 06/08/18 0801 06/08/18 1131  BP:  133/80 133/76  125/66  Pulse: 72 78 68 66  Resp: 19   20  Temp:  97.8 F (36.6 C)  98 F (36.7 C)  TempSrc:  Oral  Oral  SpO2: 96% 96%  100%  Weight:  98 kg    Height:        GEN- The patient is well appearing, alert and oriented x 3 today.   HEENT: normocephalic, atraumatic; sclera clear, conjunctiva pink; hearing intact; oropharynx clear; neck supple, no JVP Lymph- no cervical lymphadenopathy Lungs- CTA b/l, normal work of breathing.  No wheezes, rales, rhonchi Heart- RRR, no murmurs, rubs or gallops, PMI not laterally displaced GI- soft, non-tender, non-distended Extremities- no clubbing, cyanosis, compression stockings, minimal edema appreciated MS- no significant deformity or atrophy Skin- warm and dry, no rash or lesion Psych- euthymic mood, full affect Neuro- strength and sensation are intact   Labs:   Lab Results  Component Value Date   WBC 7.1 04/12/2018   HGB 12.6 (L) 04/12/2018   HCT 37.3 (L) 04/12/2018   MCV 99.5 04/12/2018   PLT 226 04/12/2018    Recent Labs  Lab 06/08/18 0425  NA 134*  K 3.8  CL 101  CO2 24  BUN 10  CREATININE 0.64  CALCIUM 9.1  GLUCOSE 100*     Discharge Medications:  Allergies as of 06/08/2018      Reactions   Bee Venom Anaphylaxis         Medication List    TAKE these medications   ALAWAY 0.025 %  ophthalmic solution Generic drug:  ketotifen Place 1 drop into both eyes 2 (two) times daily as needed (for dry eyes).   atenolol 50 MG tablet Commonly known as:  TENORMIN TAKE 1 TABLET BY MOUTH ONCE DAILY   atorvastatin 10 MG tablet Commonly known as:  LIPITOR Take 10 mg by mouth daily.   dofetilide 125 MCG capsule Commonly known as:  TIKOSYN Take 1 capsule (125 mcg total) by mouth 2 (two) times daily.   EPINEPHrine 0.3 mg/0.3 mL Soaj injection Commonly known as:  EPI-PEN Inject 0.3 mg into the muscle once as needed for anaphylaxis.   finasteride 5 MG tablet Commonly known as:  PROSCAR Take 5 mg by mouth daily.   Fish Oil  1200 MG Caps Take 1,200 mg by mouth daily.   furosemide 40 MG tablet Commonly known as:  LASIX Take 1 tablet (40 mg total) by mouth daily. Start taking on:  06/09/2018   Glucosamine HCl 1000 MG Tabs Take 2,000 mg by mouth daily.   losartan 100 MG tablet Commonly known as:  COZAAR Take 100 mg by mouth daily.   Magnesium 200 MG Tabs Take 1 tablet (200 mg total) by mouth daily.   multivitamin capsule Take 1 capsule by mouth daily.   mupirocin ointment 2 % Commonly known as:  BACTROBAN Apply 1 application topically 2 (two) times daily as needed (for wound care).   nitroGLYCERIN 0.4 MG SL tablet Commonly known as:  NITROSTAT Place 1 tablet (0.4 mg total) under the tongue every 5 (five) minutes as needed for chest pain. Reported on 12/07/2015   NON FORMULARY CPAP Machine at night   Potassium Chloride ER 20 MEQ Tbcr Take 20 mEq by mouth daily.   thiamine 100 MG tablet Take 100 mg by mouth daily.   vitamin A & D ointment Apply 1 application topically as needed for dry skin.   XARELTO 20 MG Tabs tablet Generic drug:  rivaroxaban TAKE ONE TABLET BY MOUTH ONCE DAILY WITH  SUPPER What changed:  See the new instructions.       Disposition: Home Discharge Instructions    Diet - low sodium heart healthy   Complete by:  As directed    Increase activity slowly   Complete by:  As directed      Follow-up Information    [facility] Follow up on 06/15/2018.   Specialty:  Cardiology Why:  11:30AM Contact information: [address] [id number] mc [place] [address] [phone]       Deboraha Sprang, MD Follow up on 07/05/2018.   Specialty:  Cardiology Why:  1:30PM Contact information: [address] [place] [address] [phone]           Duration of Discharge Encounter: Greater than 30 minutes including physician time.  Signed, Chanetta Marshall, NP 06/08/2018 11:58 AM   I have seen and  examined this patient with Tommye Standard.  Agree with above, note added to reflect my findings.  On exam, RRR, no murmurs, lungs clear.   Patient admitted for dofetilide loading.  QTC prolonged with a 500 mcg and 250 mcg dose.  Dose was reduced to 125 mcg with an acceptable QTC.  Discharged with plan to follow-up in A. fib clinic.  Will M. Camnitz MD 06/08/2018 5:09 PM

## 2018-06-11 ENCOUNTER — Telehealth: Payer: Self-pay | Admitting: Medical Oncology

## 2018-06-11 NOTE — Telephone Encounter (Signed)
confirmed appt  For lab on 10/7 and Mohamed on 10/23.

## 2018-06-15 ENCOUNTER — Ambulatory Visit (HOSPITAL_COMMUNITY)
Admission: RE | Admit: 2018-06-15 | Discharge: 2018-06-15 | Disposition: A | Discharge status: Home / Self Care | Payer: Medicare HMO | Source: Ambulatory Visit | Attending: Nurse Practitioner | Admitting: Nurse Practitioner

## 2018-06-15 ENCOUNTER — Encounter (HOSPITAL_COMMUNITY): Payer: Self-pay | Admitting: Nurse Practitioner

## 2018-06-15 VITALS — BP 126/72 | HR 70 | Ht 72.0 in | Wt 222.0 lb

## 2018-06-15 DIAGNOSIS — I4819 Other persistent atrial fibrillation: Secondary | ICD-10-CM | POA: Diagnosis not present

## 2018-06-15 DIAGNOSIS — Z87891 Personal history of nicotine dependence: Secondary | ICD-10-CM | POA: Insufficient documentation

## 2018-06-15 DIAGNOSIS — Z955 Presence of coronary angioplasty implant and graft: Secondary | ICD-10-CM | POA: Insufficient documentation

## 2018-06-15 DIAGNOSIS — I10 Essential (primary) hypertension: Secondary | ICD-10-CM | POA: Diagnosis not present

## 2018-06-15 DIAGNOSIS — G4730 Sleep apnea, unspecified: Secondary | ICD-10-CM | POA: Diagnosis not present

## 2018-06-15 DIAGNOSIS — Z7901 Long term (current) use of anticoagulants: Secondary | ICD-10-CM | POA: Diagnosis not present

## 2018-06-15 DIAGNOSIS — I4811 Longstanding persistent atrial fibrillation: Secondary | ICD-10-CM | POA: Diagnosis not present

## 2018-06-15 DIAGNOSIS — Z79899 Other long term (current) drug therapy: Secondary | ICD-10-CM | POA: Insufficient documentation

## 2018-06-15 DIAGNOSIS — I872 Venous insufficiency (chronic) (peripheral): Secondary | ICD-10-CM | POA: Insufficient documentation

## 2018-06-15 DIAGNOSIS — Z8249 Family history of ischemic heart disease and other diseases of the circulatory system: Secondary | ICD-10-CM | POA: Diagnosis not present

## 2018-06-15 DIAGNOSIS — Z950 Presence of cardiac pacemaker: Secondary | ICD-10-CM | POA: Insufficient documentation

## 2018-06-15 DIAGNOSIS — I2510 Atherosclerotic heart disease of native coronary artery without angina pectoris: Secondary | ICD-10-CM | POA: Diagnosis not present

## 2018-06-15 DIAGNOSIS — I4892 Unspecified atrial flutter: Secondary | ICD-10-CM | POA: Insufficient documentation

## 2018-06-15 DIAGNOSIS — E785 Hyperlipidemia, unspecified: Secondary | ICD-10-CM | POA: Diagnosis not present

## 2018-06-15 DIAGNOSIS — I442 Atrioventricular block, complete: Secondary | ICD-10-CM

## 2018-06-15 DIAGNOSIS — I4891 Unspecified atrial fibrillation: Secondary | ICD-10-CM | POA: Diagnosis present

## 2018-06-15 HISTORY — DX: Other persistent atrial fibrillation: I48.19

## 2018-06-15 HISTORY — DX: Atrioventricular block, complete: I44.2

## 2018-06-15 LAB — BASIC METABOLIC PANEL
ANION GAP: 9 (ref 5–15)
BUN: 8 mg/dL (ref 8–23)
CALCIUM: 9.3 mg/dL (ref 8.9–10.3)
CO2: 24 mmol/L (ref 22–32)
Chloride: 101 mmol/L (ref 98–111)
Creatinine, Ser: 0.64 mg/dL (ref 0.61–1.24)
GFR calc Af Amer: >60 mL/min (ref >60)
Glucose, Bld: 102 mg/dL — ABNORMAL HIGH (ref 70–99)
POTASSIUM: 4.3 mmol/L (ref 3.5–5.1)
SODIUM: 134 mmol/L — AB (ref 135–145)

## 2018-06-15 LAB — MAGNESIUM: MAGNESIUM: 2 mg/dL (ref 1.7–2.4)

## 2018-06-15 MED ORDER — ATENOLOL 25 MG PO TABS: 25.0000 mg | ORAL_TABLET | Freq: Every day | ORAL | 6 refills | Status: DC

## 2018-06-15 NOTE — Patient Instructions (Signed)
Increase atenolol to 75mg  a day  (take a 50mg  tablet along with a 25mg  tablet to make 75mg  a day)

## 2018-06-18 ENCOUNTER — Inpatient Hospital Stay: Payer: Medicare HMO | Attending: Internal Medicine

## 2018-06-18 ENCOUNTER — Other Ambulatory Visit: Payer: Self-pay | Admitting: *Deleted

## 2018-06-18 ENCOUNTER — Encounter (HOSPITAL_COMMUNITY): Payer: Self-pay | Admitting: Nurse Practitioner

## 2018-06-18 DIAGNOSIS — I2510 Atherosclerotic heart disease of native coronary artery without angina pectoris: Secondary | ICD-10-CM | POA: Insufficient documentation

## 2018-06-18 DIAGNOSIS — I4891 Unspecified atrial fibrillation: Secondary | ICD-10-CM | POA: Diagnosis not present

## 2018-06-18 DIAGNOSIS — Z79899 Other long term (current) drug therapy: Secondary | ICD-10-CM | POA: Insufficient documentation

## 2018-06-18 DIAGNOSIS — D472 Monoclonal gammopathy: Secondary | ICD-10-CM | POA: Diagnosis not present

## 2018-06-18 DIAGNOSIS — Z7901 Long term (current) use of anticoagulants: Secondary | ICD-10-CM | POA: Insufficient documentation

## 2018-06-18 DIAGNOSIS — C9000 Multiple myeloma not having achieved remission: Secondary | ICD-10-CM

## 2018-06-18 DIAGNOSIS — I10 Essential (primary) hypertension: Secondary | ICD-10-CM | POA: Insufficient documentation

## 2018-06-18 LAB — CMP (CANCER CENTER ONLY)
ALK PHOS: 64 U/L (ref 38–126)
ALT: 34 U/L (ref 0–44)
AST: 35 U/L (ref 15–41)
Albumin: 3.6 g/dL (ref 3.5–5.0)
Anion gap: 9 (ref 5–15)
BUN: 8 mg/dL (ref 8–23)
CHLORIDE: 97 mmol/L — AB (ref 98–111)
CO2: 28 mmol/L (ref 22–32)
CREATININE: 0.72 mg/dL (ref 0.61–1.24)
Calcium: 9.7 mg/dL (ref 8.9–10.3)
GFR, Est AFR Am: >60 mL/min (ref >60)
Glucose, Bld: 90 mg/dL (ref 70–99)
Potassium: 4.2 mmol/L (ref 3.5–5.1)
SODIUM: 134 mmol/L — AB (ref 135–145)
Total Bilirubin: 0.8 mg/dL (ref 0.3–1.2)
Total Protein: 8.4 g/dL — ABNORMAL HIGH (ref 6.5–8.1)

## 2018-06-18 LAB — CBC WITH DIFFERENTIAL (CANCER CENTER ONLY)
Basophils Absolute: 0 10*3/uL (ref 0.0–0.1)
Basophils Relative: 1 %
EOS ABS: 0.2 10*3/uL (ref 0.0–0.5)
EOS PCT: 2 %
HCT: 38.1 % — ABNORMAL LOW (ref 38.4–49.9)
Hemoglobin: 13.6 g/dL (ref 13.0–17.1)
LYMPHS ABS: 1.3 10*3/uL (ref 0.9–3.3)
Lymphocytes Relative: 19 %
MCH: 34.6 pg — AB (ref 27.2–33.4)
MCHC: 35.6 g/dL (ref 32.0–36.0)
MCV: 97.2 fL (ref 79.3–98.0)
MONOS PCT: 10 %
Monocytes Absolute: 0.7 10*3/uL (ref 0.1–0.9)
Neutro Abs: 4.4 10*3/uL (ref 1.5–6.5)
Neutrophils Relative %: 68 %
PLATELETS: 213 10*3/uL (ref 140–400)
RBC: 3.92 MIL/uL — ABNORMAL LOW (ref 4.20–5.82)
RDW: 12.4 % (ref 11.0–14.6)
WBC Count: 6.5 10*3/uL (ref 4.0–10.3)

## 2018-06-18 NOTE — Progress Notes (Signed)
Primary Care Physician: [name], MD Primary Electrophysiologist: Tayo Maute is a 77 y.o. male with a history of persistent atrial fibrillation who presents for follow up in the [facility]. He was admitted for Tikosyn load and cardioverted last week.  He presents today for follow up.  He was unable to tell a significant difference symptomatically between SR and AF.   Today, he  denies symptoms of palpitations, chest pain, shortness of breath, orthopnea, PND, lower extremity edema, dizziness, presyncope, syncope, snoring, daytime somnolence, bleeding, or neurologic sequela. The patient is tolerating medications without difficulties and is otherwise without complaint today.     Past Medical History:  Diagnosis Date  . Atrial flutter ([facility])    A. s/p prior ablation;  B.  s/p CTI ablation 10/24/10 (Dr. Caryl Comes)  . CAD (coronary artery disease)    A.  s/p CFX in the past;   B.  cath 06/2010: LAD 30-40%, D1 50%, OM1 50%, AVCFX stent 30%, dCFX 70-80% (med Rx), mRCA 40%;      C.  Echo 06/2010: EF 60-65%, mod LVH; mild LAE     . Complete heart block ([facility])    a. s/p STJ pacemaker  . Cubital tunnel syndrome   . Degenerative disc disease   . Heart block   . HOH (hard of hearing)   . Hx of adenomatous colonic polyps   . Hyperlipidemia   . Hypertension   . Multiple myeloma   . Persistent atrial fibrillation   . Sleep apnea   . Venous insufficiency    Past Surgical History:  Procedure Laterality Date  . CARDIOVERSION N/A 04/20/2018   Procedure: CARDIOVERSION;  Surgeon: Skeet Latch, MD;  Location: [facility];  Service: Cardiovascular;  Laterality: N/A;  . COLONOSCOPY  10-07-2009   TA polyp, tics, hems   . CORONARY ANGIOPLASTY WITH STENT PLACEMENT  2005  . PACEMAKER PLACEMENT  2011  . POLYPECTOMY    . Right olecranon nerve surgery    . VENTRAL HERNIA REPAIR      Current Outpatient Medications  Medication Sig Dispense Refill  . atenolol  (TENORMIN) 50 MG tablet TAKE 1 TABLET BY MOUTH ONCE DAILY (Patient taking differently: Take 50 mg by mouth daily. ) 90 tablet 2  . atorvastatin (LIPITOR) 10 MG tablet Take 10 mg by mouth daily.    Marland Kitchen dofetilide (TIKOSYN) 125 MCG capsule Take 1 capsule (125 mcg total) by mouth 2 (two) times daily. 60 capsule 6  . finasteride (PROSCAR) 5 MG tablet Take 5 mg by mouth daily.    . furosemide (LASIX) 40 MG tablet Take 1 tablet (40 mg total) by mouth daily. 30 tablet 6  . Glucosamine HCl 1000 MG TABS Take 2,000 mg by mouth daily.     Marland Kitchen ketotifen (ALAWAY) 0.025 % ophthalmic solution Place 1 drop into both eyes 2 (two) times daily as needed (for dry eyes).     Marland Kitchen losartan (COZAAR) 100 MG tablet Take 100 mg by mouth daily.    . Magnesium 200 MG TABS Take 1 tablet (200 mg total) by mouth daily. 30 each   . Multiple Vitamin (MULTIVITAMIN) capsule Take 1 capsule by mouth daily.      . mupirocin ointment (BACTROBAN) 2 % Apply 1 application topically 2 (two) times daily as needed (for wound care).    . NON FORMULARY CPAP Machine at night    . Omega-3 Fatty Acids (FISH OIL) 1200 MG CAPS Take 1,200 mg by mouth  daily.     . potassium chloride 20 MEQ TBCR Take 20 mEq by mouth daily. 30 tablet 3  . thiamine 100 MG tablet Take 100 mg by mouth daily.    . Vitamins A & D (VITAMIN A & D) ointment Apply 1 application topically as needed for dry skin.    Marland Kitchen XARELTO 20 MG TABS tablet TAKE ONE TABLET BY MOUTH ONCE DAILY WITH  SUPPER (Patient taking differently: Take 20 mg by mouth daily with supper. ) 90 tablet 3  . atenolol (TENORMIN) 25 MG tablet Take 1 tablet (25 mg total) by mouth daily. Take with 68m dose to make 752ma day 30 tablet 6  . EPINEPHrine 0.3 mg/0.3 mL IJ SOAJ injection Inject 0.3 mg into the muscle once as needed for anaphylaxis.    . Marland KitchenitroGLYCERIN (NITROSTAT) 0.4 MG SL tablet Place 1 tablet (0.4 mg total) under the tongue every 5 (five) minutes as needed for chest pain. Reported on 12/07/2015 (Patient not  taking: Reported on 06/15/2018) 25 tablet 3   No current facility-administered medications for this encounter.     Allergies  Allergen Reactions  . Bee Venom Anaphylaxis         Social History   Socioeconomic History  . Marital status: Divorced    Spouse name: Not on file  . Number of children: Not on file  . Years of education: Not on file  . Highest education level: Not on file  Occupational History  . Occupation: Retired from [employer] banker[employer]  . Occupation: still farms part time  Social Needs  . Financial resource strain: Not on file  . Food insecurity:    Worry: Not on file    Inability: Not on file  . Transportation needs:    Medical: Not on file    Non-medical: Not on file  Tobacco Use  . Smoking status: Former Smoker    Packs/day: 1.00    Years: 49.00    Pack years: 49.00    Types: Cigarettes    Last attempt to quit: 09/12/2004    Years since quitting: 13.7  . Smokeless tobacco: Former [employer] developer. Tobacco comment: started at age [age]smoked 1 ppd; quit in 2006  Substance and Sexual Activity  . Alcohol use: Yes    Alcohol/week: 28.0 standard drinks    Types: 28 Standard drinks or equivalent per week    Comment: occasional  . Drug use: No  . Sexual activity: Not Currently  Lifestyle  . Physical activity:    Days per week: Not on file    Minutes per session: Not on file  . Stress: Not on file  Relationships  . Social connections:    Talks on phone: Not on file    Gets together: Not on file    Attends religious service: Not on file    Active member of club or organization: Not on file    Attends meetings of clubs or organizations: Not on file    Relationship status: Not on file  . Intimate partner violence:    Fear of current or ex partner: Not on file    Emotionally abused: Not on file    Physically abused: Not on file    Forced sexual activity: Not on file  Other Topics Concern  . Not on file  Social History Narrative  . Not  on file    Family History  Problem Relation Age of Onset  . Heart disease Father   .  Cancer Father        prostate  . Heart disease Brother   . Colon cancer Neg Hx     ROS- All systems are reviewed and negative except as per the HPI above.  Physical Exam: Vitals:   06/15/18 1135  BP: 126/72  Pulse: 70  Weight: 100.7 kg  Height: 6' (1.829 m)    GEN- The patient is elderly appearing, alert and oriented x 3 today.   Head- normocephalic, atraumatic Eyes-  Sclera clear, conjunctiva pink Ears- hearing intact Oropharynx- clear Neck- supple  Lungs- Clear to ausculation bilaterally, normal work of breathing Heart- Regular rate and rhythm (paced) GI- soft, NT, ND, + BS Extremities- no clubbing, cyanosis, or edema MS- no significant deformity or atrophy Skin- no rash or lesion Psych- euthymic mood, full affect Neuro- strength and sensation are intact  Wt Readings from Last 3 Encounters:  06/15/18 100.7 kg  06/08/18 98 kg  06/05/18 103 kg    EKG today demonstrates atrial fibrillation with V pacing  Epic records are reviewed at length today  Assessment and Plan:  1. Persistent atrial fibrillation Per device interrogation today, SR lasted for about a day prior to returning to AF His Tikosyn dose is 179mg twice daily 2/2 QT prolongation in the hospital For now, will continue Tikosyn (although I am not optimistic about this working long term) and increase Atenolol to 769mdaily If still in AF at follow up with Dr KlCaryl Comeswould stop Tikosyn.  His only other AAD option is amidoarone. As he could not tell that much difference symptomatically in SR, I think leaving him in AF would be a reasonable approach.  Continue Xarelto for CHADS2VASC of 4 BMET, Mg today   2.  Complete heart block Normal device function on interrogation today  3.  HTN Stable No change required today   Follow up with Dr KlCaryl Comesn 4 weeks as scheduled   AmChanetta MarshallNP 06/18/2018 12:02 PM

## 2018-06-19 ENCOUNTER — Telehealth (HOSPITAL_COMMUNITY): Payer: Self-pay | Admitting: *Deleted

## 2018-06-19 NOTE — Telephone Encounter (Signed)
Patient called in stating he feels as though he may be back in normal rhythm. He is going to send a transmission for review. Informed pt I will alert device clinic. Pt would like to know results of transmission [phone]

## 2018-06-19 NOTE — Telephone Encounter (Signed)
Spoke with pt informed him that he was still in atrial fib, pt voiced understanding and verified pts apt with Dr. Caryl Comes on 07/05/18

## 2018-06-25 ENCOUNTER — Ambulatory Visit: Payer: Medicare HMO | Admitting: Internal Medicine

## 2018-07-04 ENCOUNTER — Encounter: Payer: Self-pay | Admitting: Internal Medicine

## 2018-07-04 ENCOUNTER — Telehealth: Payer: Self-pay | Admitting: Internal Medicine

## 2018-07-04 ENCOUNTER — Inpatient Hospital Stay: Payer: Medicare HMO | Admitting: Internal Medicine

## 2018-07-04 ENCOUNTER — Inpatient Hospital Stay: Payer: Medicare HMO

## 2018-07-04 VITALS — BP 111/66 | HR 66 | Temp 97.8°F | Resp 18 | Ht 72.0 in | Wt 227.4 lb

## 2018-07-04 DIAGNOSIS — C9000 Multiple myeloma not having achieved remission: Secondary | ICD-10-CM | POA: Diagnosis not present

## 2018-07-04 DIAGNOSIS — I2510 Atherosclerotic heart disease of native coronary artery without angina pectoris: Secondary | ICD-10-CM | POA: Diagnosis not present

## 2018-07-04 DIAGNOSIS — Z7901 Long term (current) use of anticoagulants: Secondary | ICD-10-CM | POA: Diagnosis not present

## 2018-07-04 DIAGNOSIS — Z79899 Other long term (current) drug therapy: Secondary | ICD-10-CM | POA: Diagnosis not present

## 2018-07-04 DIAGNOSIS — D472 Monoclonal gammopathy: Secondary | ICD-10-CM

## 2018-07-04 DIAGNOSIS — I10 Essential (primary) hypertension: Secondary | ICD-10-CM | POA: Diagnosis not present

## 2018-07-04 DIAGNOSIS — I4891 Unspecified atrial fibrillation: Secondary | ICD-10-CM

## 2018-07-04 NOTE — Progress Notes (Signed)
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   INTERVAL HISTORY: DARDAN SHELTON 77 y.o. male returns to the clinic today for follow-up visit.  The patient is feeling fine today except for fatigue.  He was admitted to the hospital few times recently for management of atrial fibrillation.  He had cardioversion that was unsuccessful.  He is scheduled to see Dr. Caryl Comes in the next few days for reevaluation of his condition.  He denied having any other chest pain but has shortness of breath with exertion with no cough or hemoptysis.  He has no nausea, vomiting, diarrhea or constipation.  He has no recent weight loss or night sweats.  The patient is here today for evaluation and repeat blood work for his multiple myeloma.   MEDICAL HISTORY: Past Medical History:  Diagnosis Date  . Atrial flutter ([facility])    A. s/p prior ablation;  B.  s/p CTI ablation 10/24/10 (Dr. Caryl Comes)  . CAD (coronary artery disease)    A.  s/p CFX in the past;   B.  cath 06/2010: LAD 30-40%, D1 50%, OM1 50%, AVCFX stent 30%, dCFX 70-80% (med Rx), mRCA 40%;      C.  Echo 06/2010: EF 60-65%, mod LVH; mild LAE     . Complete heart block ([facility])    a. s/p STJ pacemaker  . Cubital tunnel syndrome   . Degenerative disc disease   . Heart block   . HOH (hard of hearing)   . Hx of adenomatous colonic polyps   . Hyperlipidemia   . Hypertension   . Multiple myeloma   . Persistent atrial fibrillation   . Sleep apnea   . Venous insufficiency     ALLERGIES:  is allergic to bee venom.  MEDICATIONS:  Current Outpatient Medications  Medication Sig Dispense Refill  . atenolol (TENORMIN) 25 MG tablet Take 1 tablet (25 mg total) by mouth daily. Take with 76m dose to make 747ma day 30 tablet 6  .  atenolol (TENORMIN) 50 MG tablet TAKE 1 TABLET BY MOUTH ONCE DAILY (Patient taking differently: Take 50 mg by mouth daily. ) 90 tablet 2  . atorvastatin (LIPITOR) 10 MG tablet Take 10 mg by mouth daily.    . Marland Kitchenofetilide (TIKOSYN) 125 MCG capsule Take 1 capsule (125 mcg total) by mouth 2 (two) times daily. 60 capsule 6  . EPINEPHrine 0.3 mg/0.3 mL IJ SOAJ injection Inject 0.3 mg into the muscle once as needed for anaphylaxis.    . finasteride (PROSCAR) 5 MG tablet Take 5 mg by mouth daily.    . furosemide (LASIX) 40 MG tablet Take 1 tablet (40 mg total) by mouth daily. 30 tablet 6  . ketotifen (ALAWAY) 0.025 % ophthalmic solution Place 1 drop into both eyes 2 (two) times daily as needed (for dry eyes).     . Marland Kitchenosartan (COZAAR) 100 MG tablet Take 100 mg by mouth daily.    . Magnesium 200 MG TABS Take 1 tablet (200 mg total) by mouth daily. 30 each   . Multiple Vitamin (MULTIVITAMIN) capsule Take 1 capsule by mouth daily.      . mupirocin ointment (BACTROBAN) 2 % Apply 1 application topically 2 (two) times daily as needed (for wound care).    . nitroGLYCERIN (NITROSTAT) 0.4  MG SL tablet Place 1 tablet (0.4 mg total) under the tongue every 5 (five) minutes as needed for chest pain. Reported on 12/07/2015 25 tablet 3  . NON FORMULARY CPAP Machine at night    . Omega-3 Fatty Acids (FISH OIL) 1200 MG CAPS Take 1,200 mg by mouth daily.     . potassium chloride 20 MEQ TBCR Take 20 mEq by mouth daily. 30 tablet 3  . thiamine 100 MG tablet Take 100 mg by mouth daily.    . Vitamins A & D (VITAMIN A & D) ointment Apply 1 application topically as needed for dry skin.    Marland Kitchen XARELTO 20 MG TABS tablet TAKE ONE TABLET BY MOUTH ONCE DAILY WITH  SUPPER (Patient taking differently: Take 20 mg by mouth daily with supper. ) 90 tablet 3   No current facility-administered medications for this visit.     SURGICAL HISTORY:  Past Surgical History:  Procedure Laterality Date  . CARDIOVERSION N/A 04/20/2018   Procedure:  CARDIOVERSION;  Surgeon: Skeet Latch, MD;  Location: [facility];  Service: Cardiovascular;  Laterality: N/A;  . COLONOSCOPY  10-07-2009   TA polyp, tics, hems   . CORONARY ANGIOPLASTY WITH STENT PLACEMENT  2005  . PACEMAKER PLACEMENT  2011  . POLYPECTOMY    . Right olecranon nerve surgery    . VENTRAL HERNIA REPAIR      REVIEW OF SYSTEMS:  A comprehensive review of systems was negative except for: Constitutional: positive for fatigue Respiratory: positive for dyspnea on exertion Musculoskeletal: positive for arthralgias   PHYSICAL EXAMINATION: General appearance: alert, cooperative, fatigued and no distress Head: Normocephalic, without obvious abnormality, atraumatic Neck: no adenopathy, no JVD, supple, symmetrical, trachea midline and thyroid not enlarged, symmetric, no tenderness/mass/nodules Lymph nodes: Cervical, supraclavicular, and axillary nodes normal. Resp: clear to auscultation bilaterally Back: symmetric, no curvature. ROM normal. No CVA tenderness. Cardio: regular rate and rhythm, S1, S2 normal, no murmur, click, rub or gallop GI: soft, non-tender; bowel sounds normal; no masses,  no organomegaly Extremities: extremities normal, atraumatic, no cyanosis or edema  ECOG PERFORMANCE STATUS: 1 - Symptomatic but completely ambulatory  Blood pressure 111/66, pulse 66, temperature 97.8 F (36.6 C), temperature source Oral, resp. rate 18, height 6' (1.829 m), weight 227 lb 6.4 oz (103.1 kg), SpO2 98 %.  LABORATORY DATA: Lab Results  Component Value Date   WBC 6.5 06/18/2018   HGB 13.6 06/18/2018   HCT 38.1 (L) 06/18/2018   MCV 97.2 06/18/2018   PLT 213 06/18/2018      Chemistry      Component Value Date/Time   NA 134 (L) 06/18/2018 0906   NA 136 06/06/2017 1058   K 4.2 06/18/2018 0906   K 4.0 06/06/2017 1058   CL 97 (L) 06/18/2018 0906   CL 97 (L) 12/03/2012 1015   CO2 28 06/18/2018 0906   CO2 24 06/06/2017 1058   BUN 8 06/18/2018 0906   BUN 5.6 (L)  06/06/2017 1058   CREATININE 0.72 06/18/2018 0906   CREATININE 0.8 06/06/2017 1058      Component Value Date/Time   CALCIUM 9.7 06/18/2018 0906   CALCIUM 9.1 06/06/2017 1058   ALKPHOS 64 06/18/2018 0906   ALKPHOS 61 06/06/2017 1058   AST 35 06/18/2018 0906   AST 36 (H) 06/06/2017 1058   ALT 34 06/18/2018 0906   ALT 23 06/06/2017 1058   BILITOT 0.8 06/18/2018 0906   BILITOT 0.46 06/06/2017 1058       RADIOGRAPHIC STUDIES: No results  found.  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 The patient is doing fine today with no specific complaints except for the fatigue and shortness of breath secondary to atrial fibrillation. I recommended for the patient to have myeloma panel performed today.  If no concerning findings for disease progression on the lab work, I will see him back for follow-up visit in 6 months for reevaluation with repeat myeloma panel. For the atrial fibrillation he will continue his current management by cardiology. He was advised to call immediately if he has any concerning symptoms in the interval. The patient voices understanding of current disease status and treatment options and is in agreement with the current care plan.  All questions were answered. The patient knows to call the clinic with any problems, questions or concerns. We can certainly see the patient much sooner if necessary. I spent 10 minutes counseling the patient face to face. The total time spent in the appointment was 15 minutes.  Disclaimer: This note was dictated with voice recognition software. Similar sounding words can inadvertently be transcribed and may not be corrected upon review.

## 2018-07-04 NOTE — Telephone Encounter (Signed)
Appts scheduled avs/calendar printed per 10/23 los °

## 2018-07-05 ENCOUNTER — Encounter: Payer: Self-pay | Admitting: Internal Medicine

## 2018-07-05 ENCOUNTER — Ambulatory Visit: Payer: Medicare HMO | Admitting: Internal Medicine

## 2018-07-05 VITALS — BP 120/70 | HR 60 | Ht 72.0 in | Wt 225.8 lb

## 2018-07-05 DIAGNOSIS — I4819 Other persistent atrial fibrillation: Secondary | ICD-10-CM

## 2018-07-05 DIAGNOSIS — Z950 Presence of cardiac pacemaker: Secondary | ICD-10-CM | POA: Diagnosis not present

## 2018-07-05 DIAGNOSIS — I442 Atrioventricular block, complete: Secondary | ICD-10-CM

## 2018-07-05 LAB — CUP PACEART INCLINIC DEVICE CHECK
Battery Remaining Longevity: 26 mo
Brady Statistic RA Percent Paced: 13 %
Brady Statistic RV Percent Paced: 99.93 %
Date Time Interrogation Session: 20191024155307
Implantable Lead Implant Date: 20111021
Implantable Lead Implant Date: 20111021
Implantable Lead Location: 753859
Lead Channel Impedance Value: 450 Ohm
Lead Channel Impedance Value: 450 Ohm
Lead Channel Pacing Threshold Amplitude: 1 V
Lead Channel Pacing Threshold Pulse Width: 0.5 ms
Lead Channel Pacing Threshold Pulse Width: 0.5 ms
Lead Channel Sensing Intrinsic Amplitude: 12 mV
Lead Channel Setting Pacing Amplitude: 1.625
Lead Channel Setting Sensing Sensitivity: 2 mV
MDC IDC LEAD LOCATION: 753860
MDC IDC MSMT BATTERY VOLTAGE: 2.77 V
MDC IDC MSMT LEADCHNL RA PACING THRESHOLD AMPLITUDE: 0.625 V
MDC IDC MSMT LEADCHNL RA SENSING INTR AMPL: 2.4 mV
MDC IDC MSMT LEADCHNL RV PACING THRESHOLD AMPLITUDE: 0.875 V
MDC IDC MSMT LEADCHNL RV PACING THRESHOLD PULSEWIDTH: 0.5 ms
MDC IDC PG IMPLANT DT: 20111021
MDC IDC PG SERIAL: 7163077
MDC IDC SET LEADCHNL RV PACING AMPLITUDE: 1.125
MDC IDC SET LEADCHNL RV PACING PULSEWIDTH: 0.5 ms
Pulse Gen Model: 2110

## 2018-07-05 LAB — IGG, IGA, IGM
IgA: 100 mg/dL (ref 61–437)
IgG (Immunoglobin G), Serum: 461 mg/dL — ABNORMAL LOW (ref 700–1600)
IgM (Immunoglobulin M), Srm: 3514 mg/dL — ABNORMAL HIGH (ref 15–143)

## 2018-07-05 LAB — KAPPA/LAMBDA LIGHT CHAINS
KAPPA FREE LGHT CHN: 13.3 mg/L (ref 3.3–19.4)
KAPPA, LAMDA LIGHT CHAIN RATIO: 0.2 — AB (ref 0.26–1.65)
Lambda free light chains: 67.7 mg/L — ABNORMAL HIGH (ref 5.7–26.3)

## 2018-07-05 LAB — BETA 2 MICROGLOBULIN, SERUM: BETA 2 MICROGLOBULIN: 1.3 mg/L (ref 0.6–2.4)

## 2018-07-05 NOTE — Progress Notes (Signed)
Patient Care Team: [name], MD as PCP - General (Internal Medicine) Murinson, Haynes Bast, MD (Hematology and Oncology)   HPI  [name] is a 77 y.o. male seen in followup for a history of complete heart block status post pacemaker 2011   He has a history of prior catheter ablation for atrial flutter. Hx coronary artery disease with prior stenting.   He has subsequently developed atrial fibrillation was hospitalized 06/08/2018 with the introduction of dofetilide.  He converted spontaneously when seen in the A. fib clinic and reverted to atrial fibrillation.  It was the impression of of A.S.-NP that he was unable to tell the difference between sinus and fibrillation and would have a high threshold for pursuing a strategy of rhythm control  Cardiac catheterization in October 2011 demonstrated left anterior descending 30% to 40% stenosis with other nonobstructive disease with medical therapy recommended.  He has had no symptomatic atrial fibrillation   He complains of exercise intolerance with shortness of breath and fatigue after as little as 100 yards.  Some dyspnea.  Trace edema.  DATE TEST EF   1/17 MYOVIEW   65 % No ischemia  9/19 Echo   65 %         Date Cr Hgb  10/19 0.72 13.6         Past Medical History:  Diagnosis Date  . Atrial flutter ([place])    A. s/p prior ablation;  B.  s/p CTI ablation 10/24/10 (Dr. Caryl Comes)  . CAD (coronary artery disease)    A.  s/p CFX in the past;   B.  cath 06/2010: LAD 30-40%, D1 50%, OM1 50%, AVCFX stent 30%, dCFX 70-80% (med Rx), mRCA 40%;      C.  Echo 06/2010: EF 60-65%, mod LVH; mild LAE     . Complete heart block ([facility])    a. s/p STJ pacemaker  . Cubital tunnel syndrome   . Degenerative disc disease   . Heart block   . HOH (hard of hearing)   . Hx of adenomatous colonic polyps   . Hyperlipidemia   . Hypertension   . Multiple myeloma   . Persistent atrial fibrillation   . Sleep apnea   . Venous insufficiency      Past Surgical History:  Procedure Laterality Date  . CARDIOVERSION N/A 04/20/2018   Procedure: CARDIOVERSION;  Surgeon: Skeet Latch, MD;  Location: [facility];  Service: Cardiovascular;  Laterality: N/A;  . COLONOSCOPY  10-07-2009   TA polyp, tics, hems   . CORONARY ANGIOPLASTY WITH STENT PLACEMENT  2005  . PACEMAKER PLACEMENT  2011  . POLYPECTOMY    . Right olecranon nerve surgery    . VENTRAL HERNIA REPAIR      Current Outpatient Medications  Medication Sig Dispense Refill  . atenolol (TENORMIN) 25 MG tablet Take 1 tablet (25 mg total) by mouth daily. Take with 97m dose to make 739ma day 30 tablet 6  . atenolol (TENORMIN) 50 MG tablet TAKE 1 TABLET BY MOUTH ONCE DAILY 90 tablet 2  . atorvastatin (LIPITOR) 10 MG tablet Take 10 mg by mouth daily.    . Marland Kitchenofetilide (TIKOSYN) 125 MCG capsule Take 1 capsule (125 mcg total) by mouth 2 (two) times daily. 60 capsule 6  . EPINEPHrine 0.3 mg/0.3 mL IJ SOAJ injection Inject 0.3 mg into the muscle once as needed for anaphylaxis.    . finasteride (PROSCAR) 5 MG tablet Take 5 mg by mouth daily.    .Marland Kitchen  furosemide (LASIX) 40 MG tablet Take 1 tablet (40 mg total) by mouth daily. 30 tablet 6  . ketotifen (ALAWAY) 0.025 % ophthalmic solution Place 1 drop into both eyes 2 (two) times daily as needed (for dry eyes).     Marland Kitchen losartan (COZAAR) 100 MG tablet Take 100 mg by mouth daily.    . Magnesium 200 MG TABS Take 1 tablet (200 mg total) by mouth daily. 30 each   . Multiple Vitamin (MULTIVITAMIN) capsule Take 1 capsule by mouth daily.      . mupirocin ointment (BACTROBAN) 2 % Apply 1 application topically 2 (two) times daily as needed (for wound care).    . nitroGLYCERIN (NITROSTAT) 0.4 MG SL tablet Place 1 tablet (0.4 mg total) under the tongue every 5 (five) minutes as needed for chest pain. Reported on 12/07/2015 25 tablet 3  . NON FORMULARY CPAP Machine at night    . Omega-3 Fatty Acids (FISH OIL) 1200 MG CAPS Take 1,200 mg by mouth daily.     .  potassium chloride 20 MEQ TBCR Take 20 mEq by mouth daily. 30 tablet 3  . thiamine 100 MG tablet Take 100 mg by mouth daily.    . Vitamins A & D (VITAMIN A & D) ointment Apply 1 application topically as needed for dry skin.    Marland Kitchen XARELTO 20 MG TABS tablet TAKE ONE TABLET BY MOUTH ONCE DAILY WITH  SUPPER 90 tablet 3   No current facility-administered medications for this visit.     Allergies  Allergen Reactions  . Bee Venom Anaphylaxis         Review of Systems negative except from HPI and PMH  Physical Exam   BP 120/70   Pulse 60   Ht 6' (1.829 m)   Wt 225 lb 12.8 oz (102.4 kg)   SpO2 97%   BMI 30.62 kg/m   Well developed and nourished in no acute distress HENT normal Neck supple with JVP-flat Clear Regular rate and rhythm, no murmurs or gallops Abd-soft with active BS No Clubbing cyanosis edema Skin-warm and dry A & Oriented  Grossly normal sensory and motor function  ECG   Atrial fibrillation/flutter with complete heart block and ventricular pacing at 60   Assessment and plan   Complete heart block   Pacemaker implantation-[employer]   Orthostatic lightheadedness  Atrial fibrillation-permanent  Exuberant heart rate response   Euvolemic.  Euvolemic continue current meds  We have reprogrammed his rate response by increasing his threshold from auto -0.5 to auto -0; decrease the slope from 12--10 and max sensor rate from 130--120.  We walked in.  Heart rate was in the 80s.  He felt some better.  No lightheadedness. Congestive heart failure class III diastolic-chronic

## 2018-07-05 NOTE — Patient Instructions (Signed)
Medication Instructions:  Your physician has recommended you make the following change in your medication:   1. Stop Tikosyn 2. Stop Atenolol (Tenormin)   Labwork: None ordered.  Testing/Procedures: None ordered.  Follow-Up: Your physician recommends that you schedule a follow-up appointment in:   3 months with Chanetta Marshall, NP  Any Other Special Instructions Will Be Listed Below (If Applicable).     If you need a refill on your cardiac medications before your next appointment, please call your pharmacy.

## 2018-07-19 ENCOUNTER — Other Ambulatory Visit: Payer: Self-pay | Admitting: Cardiovascular Disease

## 2018-07-26 DIAGNOSIS — R21 Rash and other nonspecific skin eruption: Secondary | ICD-10-CM | POA: Diagnosis not present

## 2018-07-26 DIAGNOSIS — L089 Local infection of the skin and subcutaneous tissue, unspecified: Secondary | ICD-10-CM | POA: Diagnosis not present

## 2018-07-26 DIAGNOSIS — I4821 Permanent atrial fibrillation: Secondary | ICD-10-CM | POA: Diagnosis not present

## 2018-07-27 DIAGNOSIS — Z85828 Personal history of other malignant neoplasm of skin: Secondary | ICD-10-CM | POA: Diagnosis not present

## 2018-07-27 DIAGNOSIS — L0109 Other impetigo: Secondary | ICD-10-CM | POA: Diagnosis not present

## 2018-07-27 DIAGNOSIS — L0101 Non-bullous impetigo: Secondary | ICD-10-CM | POA: Diagnosis not present

## 2018-08-28 DIAGNOSIS — G4733 Obstructive sleep apnea (adult) (pediatric): Secondary | ICD-10-CM | POA: Diagnosis not present

## 2018-08-30 ENCOUNTER — Ambulatory Visit (INDEPENDENT_AMBULATORY_CARE_PROVIDER_SITE_OTHER): Payer: Medicare HMO

## 2018-08-30 DIAGNOSIS — I442 Atrioventricular block, complete: Secondary | ICD-10-CM

## 2018-08-30 NOTE — Progress Notes (Signed)
Remote pacemaker transmission.   

## 2018-09-14 DIAGNOSIS — R310 Gross hematuria: Secondary | ICD-10-CM | POA: Diagnosis not present

## 2018-09-14 DIAGNOSIS — N401 Enlarged prostate with lower urinary tract symptoms: Secondary | ICD-10-CM | POA: Diagnosis not present

## 2018-09-14 DIAGNOSIS — R351 Nocturia: Secondary | ICD-10-CM | POA: Diagnosis not present

## 2018-09-29 ENCOUNTER — Other Ambulatory Visit (HOSPITAL_COMMUNITY): Payer: Self-pay | Admitting: Physician Assistant

## 2018-09-30 LAB — CUP PACEART REMOTE DEVICE CHECK
Battery Remaining Longevity: 22 mo
Battery Voltage: 2.75 V
Brady Statistic AP VS Percent: 0 %
Brady Statistic AS VP Percent: 0 %
Brady Statistic RA Percent Paced: 1 %
Date Time Interrogation Session: 20191219122659
Implantable Lead Implant Date: 20111021
Implantable Lead Location: 753859
Implantable Lead Location: 753860
Implantable Pulse Generator Implant Date: 20111021
Lead Channel Impedance Value: 460 Ohm
Lead Channel Pacing Threshold Amplitude: 0.625 V
Lead Channel Pacing Threshold Pulse Width: 0.5 ms
Lead Channel Pacing Threshold Pulse Width: 0.5 ms
Lead Channel Sensing Intrinsic Amplitude: 12 mV
Lead Channel Setting Pacing Amplitude: 1.125
Lead Channel Setting Pacing Amplitude: 1.625
Lead Channel Setting Pacing Pulse Width: 0.5 ms
MDC IDC LEAD IMPLANT DT: 20111021
MDC IDC MSMT BATTERY REMAINING PERCENTAGE: 17 %
MDC IDC MSMT LEADCHNL RA SENSING INTR AMPL: 2.4 mV
MDC IDC MSMT LEADCHNL RV IMPEDANCE VALUE: 480 Ohm
MDC IDC MSMT LEADCHNL RV PACING THRESHOLD AMPLITUDE: 0.875 V
MDC IDC SET LEADCHNL RV SENSING SENSITIVITY: 2 mV
MDC IDC STAT BRADY AP VP PERCENT: 0 %
MDC IDC STAT BRADY AS VS PERCENT: 0 %
MDC IDC STAT BRADY RV PERCENT PACED: 95 %
Pulse Gen Model: 2110
Pulse Gen Serial Number: 7163077

## 2018-10-11 DIAGNOSIS — I8311 Varicose veins of right lower extremity with inflammation: Secondary | ICD-10-CM | POA: Diagnosis not present

## 2018-10-11 DIAGNOSIS — I8312 Varicose veins of left lower extremity with inflammation: Secondary | ICD-10-CM | POA: Diagnosis not present

## 2018-10-11 DIAGNOSIS — I83893 Varicose veins of bilateral lower extremities with other complications: Secondary | ICD-10-CM | POA: Diagnosis not present

## 2018-10-23 DIAGNOSIS — I480 Paroxysmal atrial fibrillation: Secondary | ICD-10-CM | POA: Diagnosis not present

## 2018-10-23 DIAGNOSIS — Z6831 Body mass index (BMI) 31.0-31.9, adult: Secondary | ICD-10-CM | POA: Diagnosis not present

## 2018-10-23 DIAGNOSIS — I10 Essential (primary) hypertension: Secondary | ICD-10-CM | POA: Diagnosis not present

## 2018-10-23 DIAGNOSIS — Z7901 Long term (current) use of anticoagulants: Secondary | ICD-10-CM | POA: Diagnosis not present

## 2018-10-23 DIAGNOSIS — K0381 Cracked tooth: Secondary | ICD-10-CM | POA: Diagnosis not present

## 2018-10-29 ENCOUNTER — Other Ambulatory Visit: Payer: Self-pay | Admitting: Cardiovascular Disease

## 2018-10-31 DIAGNOSIS — L570 Actinic keratosis: Secondary | ICD-10-CM | POA: Diagnosis not present

## 2018-10-31 DIAGNOSIS — D485 Neoplasm of uncertain behavior of skin: Secondary | ICD-10-CM | POA: Diagnosis not present

## 2018-10-31 DIAGNOSIS — D0461 Carcinoma in situ of skin of right upper limb, including shoulder: Secondary | ICD-10-CM | POA: Diagnosis not present

## 2018-10-31 DIAGNOSIS — L814 Other melanin hyperpigmentation: Secondary | ICD-10-CM | POA: Diagnosis not present

## 2018-10-31 DIAGNOSIS — L110 Acquired keratosis follicularis: Secondary | ICD-10-CM | POA: Diagnosis not present

## 2018-10-31 DIAGNOSIS — Z85828 Personal history of other malignant neoplasm of skin: Secondary | ICD-10-CM | POA: Diagnosis not present

## 2018-10-31 DIAGNOSIS — D225 Melanocytic nevi of trunk: Secondary | ICD-10-CM | POA: Diagnosis not present

## 2018-10-31 DIAGNOSIS — L821 Other seborrheic keratosis: Secondary | ICD-10-CM | POA: Diagnosis not present

## 2018-11-19 DIAGNOSIS — R7301 Impaired fasting glucose: Secondary | ICD-10-CM | POA: Diagnosis not present

## 2018-11-19 DIAGNOSIS — E7849 Other hyperlipidemia: Secondary | ICD-10-CM | POA: Diagnosis not present

## 2018-11-19 DIAGNOSIS — Z125 Encounter for screening for malignant neoplasm of prostate: Secondary | ICD-10-CM | POA: Diagnosis not present

## 2018-11-19 DIAGNOSIS — I10 Essential (primary) hypertension: Secondary | ICD-10-CM | POA: Diagnosis not present

## 2018-11-19 DIAGNOSIS — R82998 Other abnormal findings in urine: Secondary | ICD-10-CM | POA: Diagnosis not present

## 2018-11-26 DIAGNOSIS — R7301 Impaired fasting glucose: Secondary | ICD-10-CM | POA: Diagnosis not present

## 2018-11-26 DIAGNOSIS — I10 Essential (primary) hypertension: Secondary | ICD-10-CM | POA: Diagnosis not present

## 2018-11-26 DIAGNOSIS — G4730 Sleep apnea, unspecified: Secondary | ICD-10-CM | POA: Diagnosis not present

## 2018-11-26 DIAGNOSIS — I4821 Permanent atrial fibrillation: Secondary | ICD-10-CM | POA: Diagnosis not present

## 2018-11-26 DIAGNOSIS — I25118 Atherosclerotic heart disease of native coronary artery with other forms of angina pectoris: Secondary | ICD-10-CM | POA: Diagnosis not present

## 2018-11-26 DIAGNOSIS — Z7901 Long term (current) use of anticoagulants: Secondary | ICD-10-CM | POA: Diagnosis not present

## 2018-11-26 DIAGNOSIS — E7849 Other hyperlipidemia: Secondary | ICD-10-CM | POA: Diagnosis not present

## 2018-11-26 DIAGNOSIS — C9000 Multiple myeloma not having achieved remission: Secondary | ICD-10-CM | POA: Diagnosis not present

## 2018-11-26 DIAGNOSIS — Z0000 Encounter for general adult medical examination without abnormal findings: Secondary | ICD-10-CM | POA: Diagnosis not present

## 2018-11-26 DIAGNOSIS — Z950 Presence of cardiac pacemaker: Secondary | ICD-10-CM | POA: Diagnosis not present

## 2018-11-27 ENCOUNTER — Telehealth: Payer: Self-pay | Admitting: *Deleted

## 2018-11-27 DIAGNOSIS — Z1212 Encounter for screening for malignant neoplasm of rectum: Secondary | ICD-10-CM | POA: Diagnosis not present

## 2018-11-27 NOTE — Telephone Encounter (Signed)
Medical records faxed to [facility]; [place] [phone]

## 2018-11-29 ENCOUNTER — Other Ambulatory Visit: Payer: Self-pay

## 2018-11-29 ENCOUNTER — Ambulatory Visit (INDEPENDENT_AMBULATORY_CARE_PROVIDER_SITE_OTHER): Payer: Medicare HMO | Admitting: *Deleted

## 2018-11-29 DIAGNOSIS — I442 Atrioventricular block, complete: Secondary | ICD-10-CM | POA: Diagnosis not present

## 2018-11-29 DIAGNOSIS — I10 Essential (primary) hypertension: Secondary | ICD-10-CM

## 2018-11-29 MED ORDER — FUROSEMIDE 40 MG PO TABS: 40.0000 mg | ORAL_TABLET | Freq: Every day | ORAL | 6 refills | Status: DC

## 2018-11-30 LAB — CUP PACEART REMOTE DEVICE CHECK
Battery Voltage: 2.71 V
Brady Statistic AP VP Percent: 0 %
Brady Statistic AS VS Percent: 0 %
Brady Statistic RA Percent Paced: 1 %
Brady Statistic RV Percent Paced: 95 %
Implantable Lead Implant Date: 20111021
Implantable Lead Location: 753860
Implantable Pulse Generator Implant Date: 20111021
Lead Channel Impedance Value: 480 Ohm
Lead Channel Pacing Threshold Amplitude: 0.625 V
Lead Channel Pacing Threshold Pulse Width: 0.5 ms
Lead Channel Setting Pacing Amplitude: 1.125
Lead Channel Setting Pacing Amplitude: 1.625
Lead Channel Setting Pacing Pulse Width: 0.5 ms
Lead Channel Setting Sensing Sensitivity: 2 mV
MDC IDC LEAD IMPLANT DT: 20111021
MDC IDC LEAD LOCATION: 753859
MDC IDC MSMT BATTERY REMAINING LONGEVITY: 14 mo
MDC IDC MSMT BATTERY REMAINING PERCENTAGE: 10 %
MDC IDC MSMT LEADCHNL RA IMPEDANCE VALUE: 480 Ohm
MDC IDC MSMT LEADCHNL RA PACING THRESHOLD PULSEWIDTH: 0.5 ms
MDC IDC MSMT LEADCHNL RA SENSING INTR AMPL: 2.4 mV
MDC IDC MSMT LEADCHNL RV PACING THRESHOLD AMPLITUDE: 0.875 V
MDC IDC MSMT LEADCHNL RV SENSING INTR AMPL: 12 mV
MDC IDC PG SERIAL: 7163077
MDC IDC SESS DTM: 20200319133933
MDC IDC STAT BRADY AP VS PERCENT: 0 %
MDC IDC STAT BRADY AS VP PERCENT: 0 %

## 2018-12-06 NOTE — Progress Notes (Signed)
Remote pacemaker transmission.   

## 2018-12-12 ENCOUNTER — Telehealth: Payer: Self-pay | Admitting: Internal Medicine

## 2018-12-12 NOTE — Telephone Encounter (Signed)
New Message:    Patient concerning appt they he need. Patient would like morning appt about 9:30. I looked for something for him I didn't see anything. Patient would like for you to leave a voice mail if do not pick up. Please call patient back.

## 2018-12-27 ENCOUNTER — Other Ambulatory Visit: Payer: Self-pay

## 2018-12-27 ENCOUNTER — Inpatient Hospital Stay: Payer: Medicare HMO | Attending: Internal Medicine

## 2018-12-27 DIAGNOSIS — I4891 Unspecified atrial fibrillation: Secondary | ICD-10-CM | POA: Diagnosis not present

## 2018-12-27 DIAGNOSIS — E785 Hyperlipidemia, unspecified: Secondary | ICD-10-CM | POA: Diagnosis not present

## 2018-12-27 DIAGNOSIS — G4730 Sleep apnea, unspecified: Secondary | ICD-10-CM | POA: Insufficient documentation

## 2018-12-27 DIAGNOSIS — Z79899 Other long term (current) drug therapy: Secondary | ICD-10-CM | POA: Diagnosis not present

## 2018-12-27 DIAGNOSIS — I10 Essential (primary) hypertension: Secondary | ICD-10-CM | POA: Insufficient documentation

## 2018-12-27 DIAGNOSIS — C9000 Multiple myeloma not having achieved remission: Secondary | ICD-10-CM | POA: Diagnosis not present

## 2018-12-27 LAB — CMP (CANCER CENTER ONLY)
ALT: 44 U/L (ref 0–44)
AST: 50 U/L — ABNORMAL HIGH (ref 15–41)
Albumin: 3.8 g/dL (ref 3.5–5.0)
Alkaline Phosphatase: 87 U/L (ref 38–126)
Anion gap: 12 (ref 5–15)
BUN: 7 mg/dL — ABNORMAL LOW (ref 8–23)
CO2: 26 mmol/L (ref 22–32)
Calcium: 9.3 mg/dL (ref 8.9–10.3)
Chloride: 98 mmol/L (ref 98–111)
Creatinine: 0.76 mg/dL (ref 0.61–1.24)
GFR, Est AFR Am: >60 mL/min (ref >60)
GFR, Estimated: >60 mL/min (ref >60)
Glucose, Bld: 91 mg/dL (ref 70–99)
Potassium: 4.2 mmol/L (ref 3.5–5.1)
Sodium: 136 mmol/L (ref 135–145)
Total Bilirubin: 1.4 mg/dL — ABNORMAL HIGH (ref 0.3–1.2)
Total Protein: 8.8 g/dL — ABNORMAL HIGH (ref 6.5–8.1)

## 2018-12-27 LAB — CBC WITH DIFFERENTIAL (CANCER CENTER ONLY)
Abs Immature Granulocytes: 0.02 10*3/uL (ref 0.00–0.07)
Basophils Absolute: 0 10*3/uL (ref 0.0–0.1)
Basophils Relative: 1 %
Eosinophils Absolute: 0.1 10*3/uL (ref 0.0–0.5)
Eosinophils Relative: 2 %
HCT: 44.1 % (ref 39.0–52.0)
Hemoglobin: 14.9 g/dL (ref 13.0–17.0)
Immature Granulocytes: 0 %
Lymphocytes Relative: 18 %
Lymphs Abs: 1.1 10*3/uL (ref 0.7–4.0)
MCH: 32.7 pg (ref 26.0–34.0)
MCHC: 33.8 g/dL (ref 30.0–36.0)
MCV: 96.9 fL (ref 80.0–100.0)
Monocytes Absolute: 0.8 10*3/uL (ref 0.1–1.0)
Monocytes Relative: 13 %
Neutro Abs: 3.9 10*3/uL (ref 1.7–7.7)
Neutrophils Relative %: 66 %
Platelet Count: 215 10*3/uL (ref 150–400)
RBC: 4.55 MIL/uL (ref 4.22–5.81)
RDW: 12.4 % (ref 11.5–15.5)
WBC Count: 6 10*3/uL (ref 4.0–10.5)
nRBC: 0 % (ref 0.0–0.2)

## 2018-12-27 LAB — LACTATE DEHYDROGENASE: LDH: 217 U/L — ABNORMAL HIGH (ref 98–192)

## 2018-12-28 LAB — BETA 2 MICROGLOBULIN, SERUM: Beta-2 Microglobulin: 1.5 mg/L (ref 0.6–2.4)

## 2018-12-28 LAB — IGG, IGA, IGM
IgA: 102 mg/dL (ref 61–437)
IgG (Immunoglobin G), Serum: 453 mg/dL — ABNORMAL LOW (ref 603–1613)
IgM (Immunoglobulin M), Srm: 3811 mg/dL — ABNORMAL HIGH (ref 15–143)

## 2018-12-28 LAB — KAPPA/LAMBDA LIGHT CHAINS
Kappa free light chain: 14.4 mg/L (ref 3.3–19.4)
Kappa, lambda light chain ratio: 0.19 — ABNORMAL LOW (ref 0.26–1.65)
Lambda free light chains: 75.3 mg/L — ABNORMAL HIGH (ref 5.7–26.3)

## 2018-12-31 ENCOUNTER — Telehealth: Payer: Self-pay | Admitting: Internal Medicine

## 2018-12-31 NOTE — Telephone Encounter (Signed)
Moved 4/23 webex f/u to 4/22. Left message. Message routed to webex coordinators.

## 2018-12-31 NOTE — Telephone Encounter (Signed)
Called regarding upcoming Webex appointment, left patient a voicemail and patient does not have e-mail listed so appointment has been changed to a telephone visit.

## 2019-01-02 ENCOUNTER — Encounter: Payer: Self-pay | Admitting: Internal Medicine

## 2019-01-02 ENCOUNTER — Inpatient Hospital Stay (HOSPITAL_BASED_OUTPATIENT_CLINIC_OR_DEPARTMENT_OTHER): Payer: Medicare HMO | Admitting: Internal Medicine

## 2019-01-02 DIAGNOSIS — C9000 Multiple myeloma not having achieved remission: Secondary | ICD-10-CM | POA: Diagnosis not present

## 2019-01-02 NOTE — Progress Notes (Signed)
[facility] [phone]   [phone]  PROGRESS NOTE FOR TELEMEDICINE VISITS  [name] Solian, MD [place] [address]  I connected [phone] on 01/02/19 at 10:00 AM EDT by telephone visit and verified that I am speaking with the correct person using two identifiers.   I discussed the limitations, risks, security and privacy concerns of performing an evaluation and management service by telemedicine and the availability of in-person appointments. I also discussed with the patient that there may be a patient responsible charge related to this service. The patient expressed understanding and agreed to proceed.  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   INTERVAL HISTORY: [name] 78 y.o. male has a telephone virtual visit with me today for evaluation and discussion of his lab results.  The patient is feeling fine today with no concerning complaints except for the generalized fatigue.  He denied having any recent chest pain, shortness of breath, cough or hemoptysis.  He has no nausea, vomiting, diarrhea or constipation.  He has no headache or visual changes.  He has no bony pains.  He had repeat myeloma panel performed recently and we are having the visit for discussion of his lab results and recommendation regarding his condition.  MEDICAL HISTORY: Past Medical History:  Diagnosis Date  . Atrial flutter ([place])    A. s/p prior ablation;  B.  s/p CTI ablation 10/24/10 (Dr. Caryl Comes)  . CAD (coronary artery disease)    A.  s/p CFX in the past;   B.  cath 06/2010: LAD 30-40%, D1 50%, OM1 50%, AVCFX stent 30%, dCFX 70-80% (med Rx), mRCA 40%;      C.  Echo 06/2010: EF 60-65%, mod LVH; mild LAE     . Complete  heart block ([facility])    a. s/p STJ pacemaker  . Cubital tunnel syndrome   . Degenerative disc disease   . Heart block   . HOH (hard of hearing)   . Hx of adenomatous colonic polyps   . Hyperlipidemia   . Hypertension   . Multiple myeloma   . Persistent atrial fibrillation   . Sleep apnea   . Venous insufficiency     ALLERGIES:  is allergic to bee venom.  MEDICATIONS:  Current Outpatient Medications  Medication Sig Dispense Refill  . atorvastatin (LIPITOR) 10 MG tablet Take 10 mg by mouth daily.    Marland Kitchen EPINEPHrine 0.3 mg/0.3 mL IJ SOAJ injection Inject 0.3 mg into the muscle once as needed for anaphylaxis.    . finasteride (PROSCAR) 5 MG tablet Take 5 mg by mouth daily.    . furosemide (LASIX) 40 MG tablet Take 1 tablet (40 mg total) by mouth daily. 30 tablet 6  . ketotifen (ALAWAY) 0.025 % ophthalmic solution Place 1 drop into both eyes 2 (two) times daily as needed (for dry eyes).     Marland Kitchen losartan (COZAAR) 100 MG tablet Take 100 mg by mouth daily.    . Magnesium 200 MG TABS Take 1 tablet (200 mg total) by mouth daily. 30 each   . Multiple Vitamin (MULTIVITAMIN) capsule Take 1 capsule by mouth daily.      . mupirocin ointment (BACTROBAN) 2 % Apply 1 application topically 2 (two) times daily as needed (for wound care).    Marland Kitchen  nitroGLYCERIN (NITROSTAT) 0.4 MG SL tablet PLACE ONE TABLET UNDER THE TONGUE EVERY 5 MINUTES AS NEEDED FOR CHEST PAIN 75 tablet 0  . NON FORMULARY CPAP Machine at night    . Omega-3 Fatty Acids (FISH OIL) 1200 MG CAPS Take 1,200 mg by mouth daily.     . Potassium Chloride ER 20 MEQ TBCR TAKE 1 TABLET BY MOUTH ONCE DAILY 90 tablet 2  . thiamine 100 MG tablet Take 100 mg by mouth daily.    . Vitamins A & D (VITAMIN A & D) ointment Apply 1 application topically as needed for dry skin.    Marland Kitchen XARELTO 20 MG TABS tablet TAKE 1 TABLET BY MOUTH ONCE DAILY WITH SUPPER 90 tablet 1   No current facility-administered medications for this visit.     SURGICAL HISTORY:  Past  Surgical History:  Procedure Laterality Date  . CARDIOVERSION N/A 04/20/2018   Procedure: CARDIOVERSION;  Surgeon: Skeet Latch, MD;  Location: [facility];  Service: Cardiovascular;  Laterality: N/A;  . COLONOSCOPY  10-07-2009   TA polyp, tics, hems   . CORONARY ANGIOPLASTY WITH STENT PLACEMENT  2005  . PACEMAKER PLACEMENT  2011  . POLYPECTOMY    . Right olecranon nerve surgery    . VENTRAL HERNIA REPAIR      REVIEW OF SYSTEMS:  A comprehensive review of systems was negative except for: Constitutional: positive for fatigue   LABORATORY DATA: Lab Results  Component Value Date   WBC 6.0 12/27/2018   HGB 14.9 12/27/2018   HCT 44.1 12/27/2018   MCV 96.9 12/27/2018   PLT 215 12/27/2018      Chemistry      Component Value Date/Time   NA 136 12/27/2018 0936   NA 136 06/06/2017 1058   K 4.2 12/27/2018 0936   K 4.0 06/06/2017 1058   CL 98 12/27/2018 0936   CL 97 (L) 12/03/2012 1015   CO2 26 12/27/2018 0936   CO2 24 06/06/2017 1058   BUN 7 (L) 12/27/2018 0936   BUN 5.6 (L) 06/06/2017 1058   CREATININE 0.76 12/27/2018 0936   CREATININE 0.8 06/06/2017 1058      Component Value Date/Time   CALCIUM 9.3 12/27/2018 0936   CALCIUM 9.1 06/06/2017 1058   ALKPHOS 87 12/27/2018 0936   ALKPHOS 61 06/06/2017 1058   AST 50 (H) 12/27/2018 0936   AST 36 (H) 06/06/2017 1058   ALT 44 12/27/2018 0936   ALT 23 06/06/2017 1058   BILITOT 1.4 (H) 12/27/2018 0936   BILITOT 0.46 06/06/2017 1058       RADIOGRAPHIC STUDIES: No results found.  ASSESSMENT AND PLAN:  This is a very pleasant 78 years old white male with history of smoldering multiple myeloma diagnosed in August 2011 and has been observation since that time. The patient is currently on observation and he is feeling fine except for fatigue. Repeat myeloma panel showed no concerning disease progression but there was mild increase in the IgM as well as the free lambda light chain. I discussed the lab results with the patient  and recommended for him to continue on observation with repeat myeloma panel in 6 months. For the atrial fibrillation he will continue his current management by cardiology. He was advised to call immediately if he has any concerning symptoms in the interval. I discussed the assessment and treatment plan with the patient. The patient was provided an opportunity to ask questions and all were answered. The patient agreed with the plan and demonstrated an understanding  of the instructions.   The patient was advised to call back or seek an in-person evaluation if the symptoms worsen or if the condition fails to improve as anticipated.  I provided 11 minutes of non face-to-face telephone visit time during this encounter, and > 50% was spent counseling as documented under my assessment & plan.  Eilleen Kempf, MD 01/02/2019 10:18 AM  Disclaimer: This note was dictated with voice recognition software. Similar sounding words can inadvertently be transcribed and may not be corrected upon review.

## 2019-01-03 ENCOUNTER — Telehealth: Payer: Self-pay | Admitting: Internal Medicine

## 2019-01-03 ENCOUNTER — Ambulatory Visit: Payer: Medicare HMO | Admitting: Internal Medicine

## 2019-01-03 NOTE — Telephone Encounter (Signed)
Tried to reach regarding schedule °

## 2019-01-17 ENCOUNTER — Telehealth: Payer: Self-pay | Admitting: Internal Medicine

## 2019-01-17 NOTE — Telephone Encounter (Signed)
Returned patient's phone call regarding rescheduling an appointment, left a voicemail. 

## 2019-02-28 ENCOUNTER — Ambulatory Visit (INDEPENDENT_AMBULATORY_CARE_PROVIDER_SITE_OTHER): Payer: Medicare HMO | Admitting: *Deleted

## 2019-02-28 ENCOUNTER — Telehealth: Payer: Self-pay

## 2019-02-28 DIAGNOSIS — I495 Sick sinus syndrome: Secondary | ICD-10-CM

## 2019-02-28 LAB — CUP PACEART REMOTE DEVICE CHECK
Battery Remaining Longevity: 7 mo
Battery Remaining Percentage: 5 %
Battery Voltage: 2.66 V
Brady Statistic AP VP Percent: 0 %
Brady Statistic AP VS Percent: 0 %
Brady Statistic AS VP Percent: 0 %
Brady Statistic AS VS Percent: 0 %
Brady Statistic RA Percent Paced: 1 %
Brady Statistic RV Percent Paced: 95 %
Date Time Interrogation Session: 20200618115052
Implantable Lead Implant Date: 20111021
Implantable Lead Implant Date: 20111021
Implantable Lead Location: 753859
Implantable Lead Location: 753860
Implantable Pulse Generator Implant Date: 20111021
Lead Channel Impedance Value: 490 Ohm
Lead Channel Impedance Value: 490 Ohm
Lead Channel Pacing Threshold Amplitude: 0.875 V
Lead Channel Pacing Threshold Pulse Width: 0.5 ms
Lead Channel Sensing Intrinsic Amplitude: 12 mV
Lead Channel Setting Pacing Amplitude: 1.125
Lead Channel Setting Pacing Amplitude: 1.625
Lead Channel Setting Pacing Pulse Width: 0.5 ms
Lead Channel Setting Sensing Sensitivity: 2 mV
Pulse Gen Model: 2110
Pulse Gen Serial Number: 7163077

## 2019-02-28 NOTE — Telephone Encounter (Signed)
Spoke to pt regarding alert for nearing ERI. Scheduled monthly battery checks up until December 2020. Pt has no further questions at this time.

## 2019-02-28 NOTE — Telephone Encounter (Signed)
LMOVM for pt to return call to DC.

## 2019-03-04 NOTE — Progress Notes (Signed)
Remote pacemaker transmission.   

## 2019-03-28 DIAGNOSIS — N401 Enlarged prostate with lower urinary tract symptoms: Secondary | ICD-10-CM | POA: Diagnosis not present

## 2019-03-28 DIAGNOSIS — R310 Gross hematuria: Secondary | ICD-10-CM | POA: Diagnosis not present

## 2019-03-28 DIAGNOSIS — R351 Nocturia: Secondary | ICD-10-CM | POA: Diagnosis not present

## 2019-04-01 ENCOUNTER — Encounter: Payer: Medicare HMO | Admitting: *Deleted

## 2019-04-02 ENCOUNTER — Telehealth: Payer: Self-pay

## 2019-04-02 ENCOUNTER — Encounter: Payer: Self-pay | Admitting: Internal Medicine

## 2019-04-02 NOTE — Telephone Encounter (Signed)
Left message for patient to remind of missed remote transmission.  

## 2019-05-02 ENCOUNTER — Ambulatory Visit (INDEPENDENT_AMBULATORY_CARE_PROVIDER_SITE_OTHER): Payer: Medicare HMO | Admitting: *Deleted

## 2019-05-02 ENCOUNTER — Other Ambulatory Visit: Payer: Self-pay | Admitting: Cardiovascular Disease

## 2019-05-02 DIAGNOSIS — I4819 Other persistent atrial fibrillation: Secondary | ICD-10-CM | POA: Diagnosis not present

## 2019-05-02 DIAGNOSIS — I495 Sick sinus syndrome: Secondary | ICD-10-CM

## 2019-05-02 LAB — CUP PACEART REMOTE DEVICE CHECK
Battery Remaining Longevity: 1 mo
Battery Remaining Percentage: 0.5 %
Battery Voltage: 2.62 V
Brady Statistic AP VP Percent: 0 %
Brady Statistic AP VS Percent: 0 %
Brady Statistic AS VP Percent: 100 %
Brady Statistic AS VS Percent: 0 %
Brady Statistic RA Percent Paced: 1 %
Brady Statistic RV Percent Paced: 95 %
Date Time Interrogation Session: 20200820135047
Implantable Lead Implant Date: 20111021
Implantable Lead Implant Date: 20111021
Implantable Lead Location: 753859
Implantable Lead Location: 753860
Implantable Pulse Generator Implant Date: 20111021
Lead Channel Impedance Value: 440 Ohm
Lead Channel Impedance Value: 450 Ohm
Lead Channel Pacing Threshold Amplitude: 0.875 V
Lead Channel Pacing Threshold Pulse Width: 0.5 ms
Lead Channel Sensing Intrinsic Amplitude: 12 mV
Lead Channel Setting Pacing Amplitude: 1.125
Lead Channel Setting Pacing Amplitude: 1.625
Lead Channel Setting Pacing Pulse Width: 0.5 ms
Lead Channel Setting Sensing Sensitivity: 2 mV
Pulse Gen Model: 2110
Pulse Gen Serial Number: 7163077

## 2019-05-02 NOTE — Telephone Encounter (Signed)
Xarelto 20mg  refill request received; pt is 78 yrs old, wt-102.4kg, Crea-0.76 on 12/27/2018, last seen by Dr. Caryl Comes on 07/05/2018 and was due in 3 months-pt has a pending appt on 06/03/2019, Diagnosis-Afib, CrCl-114.68ml/min; will send in refill to requested pharmacy.

## 2019-05-07 ENCOUNTER — Emergency Department (HOSPITAL_BASED_OUTPATIENT_CLINIC_OR_DEPARTMENT_OTHER)
Admit: 2019-05-07 | Discharge: 2019-05-07 | Disposition: A | Discharge status: Home / Self Care | Payer: Medicare HMO | Attending: Emergency Medicine | Admitting: Emergency Medicine

## 2019-05-07 ENCOUNTER — Emergency Department (HOSPITAL_COMMUNITY): Payer: Medicare HMO

## 2019-05-07 ENCOUNTER — Emergency Department (HOSPITAL_COMMUNITY)
Admission: EM | Admit: 2019-05-07 | Discharge: 2019-05-07 | Disposition: A | Discharge status: Home / Self Care | Payer: Medicare HMO | Attending: Emergency Medicine | Admitting: Emergency Medicine

## 2019-05-07 ENCOUNTER — Encounter (HOSPITAL_COMMUNITY): Payer: Self-pay | Admitting: Emergency Medicine

## 2019-05-07 DIAGNOSIS — Y999 Unspecified external cause status: Secondary | ICD-10-CM | POA: Insufficient documentation

## 2019-05-07 DIAGNOSIS — Z950 Presence of cardiac pacemaker: Secondary | ICD-10-CM | POA: Insufficient documentation

## 2019-05-07 DIAGNOSIS — Y929 Unspecified place or not applicable: Secondary | ICD-10-CM | POA: Insufficient documentation

## 2019-05-07 DIAGNOSIS — I2510 Atherosclerotic heart disease of native coronary artery without angina pectoris: Secondary | ICD-10-CM | POA: Diagnosis not present

## 2019-05-07 DIAGNOSIS — R600 Localized edema: Secondary | ICD-10-CM | POA: Diagnosis not present

## 2019-05-07 DIAGNOSIS — Z79899 Other long term (current) drug therapy: Secondary | ICD-10-CM | POA: Diagnosis not present

## 2019-05-07 DIAGNOSIS — M1711 Unilateral primary osteoarthritis, right knee: Secondary | ICD-10-CM | POA: Diagnosis not present

## 2019-05-07 DIAGNOSIS — M79604 Pain in right leg: Secondary | ICD-10-CM | POA: Diagnosis present

## 2019-05-07 DIAGNOSIS — M170 Bilateral primary osteoarthritis of knee: Secondary | ICD-10-CM | POA: Insufficient documentation

## 2019-05-07 DIAGNOSIS — Y939 Activity, unspecified: Secondary | ICD-10-CM | POA: Insufficient documentation

## 2019-05-07 DIAGNOSIS — W010XXA Fall on same level from slipping, tripping and stumbling without subsequent striking against object, initial encounter: Secondary | ICD-10-CM | POA: Insufficient documentation

## 2019-05-07 DIAGNOSIS — I10 Essential (primary) hypertension: Secondary | ICD-10-CM | POA: Insufficient documentation

## 2019-05-07 DIAGNOSIS — M1712 Unilateral primary osteoarthritis, left knee: Secondary | ICD-10-CM | POA: Diagnosis not present

## 2019-05-07 DIAGNOSIS — Z87891 Personal history of nicotine dependence: Secondary | ICD-10-CM | POA: Diagnosis not present

## 2019-05-07 DIAGNOSIS — L03115 Cellulitis of right lower limb: Secondary | ICD-10-CM | POA: Diagnosis not present

## 2019-05-07 DIAGNOSIS — M79661 Pain in right lower leg: Secondary | ICD-10-CM | POA: Diagnosis not present

## 2019-05-07 DIAGNOSIS — M7989 Other specified soft tissue disorders: Secondary | ICD-10-CM | POA: Diagnosis not present

## 2019-05-07 DIAGNOSIS — S8001XA Contusion of right knee, initial encounter: Secondary | ICD-10-CM | POA: Diagnosis not present

## 2019-05-07 MED ORDER — ACETAMINOPHEN 325 MG PO TABS
650.0000 mg | ORAL_TABLET | Freq: Once | ORAL | Status: AC
  Administered 2019-05-07: 650 mg via ORAL
  Filled 2019-05-07: qty 2

## 2019-05-07 NOTE — ED Triage Notes (Addendum)
Pt arrives from [facility] to rule out DVT according to the provider who called report. [place] the [place] 1 [place] 1 day ago and fell onto both knees. Pt has a large amount of swelling to right knee with bruises and swelling that has moved down into calf and ankle. Pt also has mild swelling to left knee with tenderness. Pt states he can still ambulate but it very painful. Pt has chronic venuous insuffiencey which makes acute assessment difficult- pt has sensation and movement in both feet- difficult to palpate pulses in triage due to swelling- bilateral feet are warm to touch.

## 2019-05-07 NOTE — ED Provider Notes (Signed)
[facility] EMERGENCY DEPARTMENT Provider Note   CSN: [id number] Arrival date & time: 05/07/19  1047     History   Chief Complaint Chief Complaint  Patient presents with  . Joint Swelling    HPI [name] is a 78 y.o. male.     HPI   He presents for evaluation of injuries that occurred about 9 days ago.  He states he fell forward landing onto his knees.  Since that time he has had gradually worse pain and swelling primarily of the right knee and right lower leg.  He went to an urgent care who suggested he come here for evaluation for DVT.  He was able to drive himself.  No prior history of DVT.  No shortness of breath, chest pain, cough, nausea, vomiting, weakness or dizziness.  There are no other known modifying factors.  Past Medical History:  Diagnosis Date  . Atrial flutter ([place])    A. s/p prior ablation;  B.  s/p CTI ablation 10/24/10 (Dr. Caryl Comes)  . CAD (coronary artery disease)    A.  s/p CFX in the past;   B.  cath 06/2010: LAD 30-40%, D1 50%, OM1 50%, AVCFX stent 30%, dCFX 70-80% (med Rx), mRCA 40%;      C.  Echo 06/2010: EF 60-65%, mod LVH; mild LAE     . Complete heart block ([facility])    a. s/p STJ pacemaker  . Cubital tunnel syndrome   . Degenerative disc disease   . Heart block   . HOH (hard of hearing)   . Hx of adenomatous colonic polyps   . Hyperlipidemia   . Hypertension   . Multiple myeloma   . Persistent atrial fibrillation   . Sleep apnea   . Venous insufficiency     Patient Active Problem List   Diagnosis Date Noted  . Visit for monitoring Tikosyn therapy 06/05/2018  . Persistent atrial fibrillation   . Pneumothorax on right 05/19/2017  . Alcohol intoxication in active alcoholic ([place]) 63/33/5456  . Right-sided muscle weakness 04/28/2013  . 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 Coronary artery disease-nonobstructive catheter in October 2011 12/27/2010  . PACEMAKER, St. Jude dual-chamber 09/14/2010   . Multiple myeloma ([place]) 07/21/2010  . SMOKER 07/21/2010  . Obstructive sleep apnea 06/12/2009  . Atrioventricular block, heart high-grade 01/29/2009  . SICK SINUS/ TACHY-BRADY SYNDROME 01/29/2009  . Atrial tachycardia/fibrillation 01/14/2009  . SCIATICA 01/14/2009  . Essential hypertension 01/03/2009  . VENOUS INSUFFICIENCY 01/03/2009  . ALLERGY 01/03/2009    Past Surgical History:  Procedure Laterality Date  . CARDIOVERSION N/A 04/20/2018   Procedure: CARDIOVERSION;  Surgeon: Skeet Latch, MD;  Location: [facility];  Service: Cardiovascular;  Laterality: N/A;  . COLONOSCOPY  10-07-2009   TA polyp, tics, hems   . CORONARY ANGIOPLASTY WITH STENT PLACEMENT  2005  . PACEMAKER PLACEMENT  2011  . POLYPECTOMY    . Right olecranon nerve surgery    . VENTRAL HERNIA REPAIR          Home Medications    Prior to Admission medications   Medication Sig Start Date End Date Taking? Authorizing Provider  atorvastatin (LIPITOR) 10 MG tablet Take 10 mg by mouth daily.    [provider]  EPINEPHrine 0.3 mg/0.3 mL IJ SOAJ injection Inject 0.3 mg into the muscle once as needed for anaphylaxis. 04/25/17   [provider]  finasteride (PROSCAR) 5 MG tablet Take 5 mg by mouth daily.  [provider]  furosemide (LASIX) 40 MG tablet Take 1 tablet (40 mg total) by mouth daily. 11/29/18   Ursuy, Renee Lynn, PA-C  ketotifen (ALAWAY) 0.025 % ophthalmic solution Place 1 drop into both eyes 2 (two) times daily as needed (for dry eyes).     [provider]  losartan (COZAAR) 100 MG tablet Take 100 mg by mouth daily.    [provider]  Magnesium 200 MG TABS Take 1 tablet (200 mg total) by mouth daily. 05/08/18   Carroll, Donna C, NP  Multiple Vitamin (MULTIVITAMIN) capsule Take 1 capsule by mouth daily.      [provider]  mupirocin ointment (BACTROBAN) 2 % Apply 1 application topically 2 (two) times daily as needed (for wound care).    [provider]  nitroGLYCERIN (NITROSTAT) 0.4 MG SL tablet PLACE ONE TABLET UNDER THE TONGUE EVERY 5 MINUTES AS NEEDED FOR CHEST PAIN 07/19/18   Nishan, Peter C, MD  NON FORMULARY CPAP Machine at night    [provider]  Omega-3 Fatty Acids (FISH OIL) 1200 MG CAPS Take 1,200 mg by mouth daily.     [provider]  Potassium Chloride ER 20 MEQ TBCR TAKE 1 TABLET BY MOUTH ONCE DAILY 10/03/18   Ursuy, Renee Lynn, PA-C  thiamine 100 MG tablet Take 100 mg by mouth daily.    [provider]  Vitamins A & D (VITAMIN A & D) ointment Apply 1 application topically as needed for dry skin.    [provider]  XARELTO 20 MG TABS tablet TAKE 1 TABLET BY MOUTH ONCE DAILY WITH SUPPER 05/02/19   Klein, Steven C, MD    Family History Family History  Problem Relation Age of Onset  . Heart disease Father   . Cancer Father        prostate  . Heart disease Brother   . Colon cancer Neg Hx     Social History Social History   Tobacco Use  . Smoking status: Former Smoker    Packs/day: 1.00    Years: 49.00    Pack years: 49.00    Types: Cigarettes    Quit date: 09/12/2004    Years since quitting: 14.6  . Smokeless tobacco: Former User  . Tobacco comment: started at age 15; smoked 1 ppd; quit in 2006  Substance Use Topics  . Alcohol use: Yes    Alcohol/week: 28.0 standard drinks    Types: 28 Standard drinks or equivalent per week    Comment: occasional  . Drug use: No     Allergies   Bee venom   Review of Systems Review of Systems  All other systems reviewed and are negative.    Physical Exam Updated Vital Signs BP 128/76 (BP Location: Right Arm)   Pulse (!) 58   Temp 98.9 F (37.2 C) (Oral)   Resp 17   Ht 5' 9" (1.753 m)   Wt 100.7 kg   SpO2 99%   BMI 32.78 kg/m   Physical Exam Vitals signs and nursing note reviewed.  Constitutional:      General: He is not in acute distress.    Appearance: He is well-developed. He is not ill-appearing,  toxic-appearing or diaphoretic.  HENT:     Head: Normocephalic and atraumatic.     Right Ear: External ear normal.     Left Ear: External ear normal.  Eyes:     Conjunctiva/sclera: Conjunctivae normal.     Pupils: Pupils are equal, round,   and reactive to light.  Neck:     Musculoskeletal: Normal range of motion and neck supple.     Trachea: Phonation normal.  Cardiovascular:     Rate and Rhythm: Normal rate and regular rhythm.  Pulmonary:     Effort: Pulmonary effort is normal.     Breath sounds: Normal breath sounds.  Musculoskeletal:     Comments: Right knee and right proximal lower leg have moderate ecchymosis, and swelling.  Right leg is more swollen than left.  Left knee mildly tender without significant swelling.  Patient is able to actively lift both legs off the stretcher and hold him independently for greater than 5 seconds.  Skin:    General: Skin is warm and dry.     Comments: Superficial abrasions, both hands, they are healing well with mild drainage and bleeding, but no significant infection appreciated.  Neurological:     Mental Status: He is alert and oriented to person, place, and time.     Cranial Nerves: No cranial nerve deficit.     Sensory: No sensory deficit.     Motor: No abnormal muscle tone.     Coordination: Coordination normal.  Psychiatric:        Behavior: Behavior normal.        Thought Content: Thought content normal.        Judgment: Judgment normal.      ED Treatments / Results  Labs (all labs ordered are listed, but only abnormal results are displayed) Labs Reviewed - No data to display  EKG None  Radiology Dg Knee Complete 4 Views Left  Result Date: 05/07/2019 CLINICAL DATA:  Fall 1 week ago.  Pain. EXAM: LEFT KNEE - COMPLETE 4+ VIEW COMPARISON:  No recent. FINDINGS: Severe Tricompartment degenerative change with chondrocalcinosis. No acute bony abnormality. Prepatellar soft tissue swelling. Peripheral vascular calcification. IMPRESSION:  1. 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 No acute bony abnormality. 2.  Prominent prepatellar soft tissue swelling. Electronically Signed   By: Thomas  Register   On: 05/07/2019 11:38   Dg Knee Complete 4 Views Right  Result Date: 05/07/2019 CLINICAL DATA:  Fall.  Swelling. EXAM: RIGHT KNEE - COMPLETE 4+ VIEW COMPARISON:  05/07/2019. FINDINGS: Tricompartment degenerative change. No acute bony or joint abnormality identified. Corticated bony density noted adjacent to the distal lateral femoral condyle most likely site of old injury or non fused secondary ossification center. No evidence of acute fracture or dislocation. Prominent prepatellar soft tissue swelling. Peripheral vascular calcification. IMPRESSION: 1. 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 No acute bony abnormality. 2.  Severe prepatellar soft tissue swelling. 3.  Peripheral vascular disease. Electronically Signed   By: Thomas  Register   On: 05/07/2019 11:35   Vas Us Lower Extremity Venous (dvt) (only [facility])  Result Date: 05/07/2019  Lower Venous Study Indications: Swelling.  Risk Factors: None identified. Limitations: Poor ultrasound/tissue interface. Comparison Study: No prior studies. Performing Technologist: Gregory Collins RVT  Examination Guidelines: A complete evaluation includes B-mode imaging, spectral Doppler, color Doppler, and power Doppler as needed of all accessible portions of each vessel. Bilateral testing is considered an integral part of a complete examination. Limited examinations for reoccurring indications may be performed as noted.  +---------+---------------+---------+-----------+----------+--------------+ RIGHT    CompressibilityPhasicitySpontaneityPropertiesThrombus Aging +---------+---------------+---------+-----------+----------+--------------+ CFV      Full           Yes      Yes                                  +---------+---------------+---------+-----------+----------+--------------+   SFJ      Full                                                        +---------+---------------+---------+-----------+----------+--------------+ FV Prox  Full                                                        +---------+---------------+---------+-----------+----------+--------------+ FV Mid   Full                                                        +---------+---------------+---------+-----------+----------+--------------+ FV DistalFull                                                        +---------+---------------+---------+-----------+----------+--------------+ PFV      Full                                                        +---------+---------------+---------+-----------+----------+--------------+ POP      Full           Yes      Yes                                 +---------+---------------+---------+-----------+----------+--------------+ PTV      Full                                                        +---------+---------------+---------+-----------+----------+--------------+ PERO     Full                                                        +---------+---------------+---------+-----------+----------+--------------+   +----+---------------+---------+-----------+----------+--------------+ LEFTCompressibilityPhasicitySpontaneityPropertiesThrombus Aging +----+---------------+---------+-----------+----------+--------------+ CFV Full           Yes      Yes                                 +----+---------------+---------+-----------+----------+--------------+     Summary: Right: There is no evidence of deep vein thrombosis in the lower extremity. A cystic structure, measuring 2.1 cm high by 3.2 cm wide by greater than 5.4 cm long, is found in the popliteal fossa. Left: No evidence of common femoral vein obstruction.  *See table(s) above for measurements  and observations. Electronically signed by Christopher Dickson MD on 05/07/2019 at 1:23:05 PM.    Final     Procedures Procedures (including critical care time)  Medications Ordered in ED Medications  acetaminophen (TYLENOL) tablet 650 mg (has no administration in time range)     Initial Impression / Assessment and Plan / ED Course  I have reviewed the triage vital signs and the nursing notes.  Pertinent labs & imaging results that were available during my care of the patient were reviewed by me and considered in my medical decision making (see chart for details).        Patient Vitals for the past 24 hrs:  BP Temp Temp src Pulse Resp SpO2 Height Weight  05/07/19 1052 128/76 98.9 F (37.2 C) Oral (!) 58 17 99 % 5' 9" (1.753 m) 100.7 kg    4:02 PM Reevaluation with update and discussion. After initial assessment and treatment, an updated evaluation reveals he states that he has a known history of degenerative joint disease of both knees.  I discussed with the patient, and all questions were answered.     Medical Decision Making: Contusion left and right knee, with arthritis.  No evidence for DVT right leg.  Incidental wounds right and left hands, not requiring intervention at this time.  No indication for further ED treatment or evaluation.  CRITICAL CARE-no Performed by:    Nursing Notes Reviewed/ Care Coordinated Applicable Imaging Reviewed Interpretation of Laboratory Data incorporated into ED treatment  The patient appears reasonably screened and/or stabilized for discharge and I doubt any other medical condition or other EMC requiring further screening, evaluation, or treatment in the ED at this time prior to discharge.  Plan: Home Medications-continue usual medicine and use Tylenol for pain; Home Treatments-elevate legs, Ace wrap intermittently right knee for comfort; return here if the recommended treatment, does not improve the symptoms;  Recommended follow up-PCP, PRN    Final Clinical Impressions(s) / ED Diagnoses   Final diagnoses:  Contusion of right knee, initial encounter  Osteoarthritis of both knees, unspecified osteoarthritis type    ED Discharge Orders    None       , , MD 05/07/19 1602  

## 2019-05-07 NOTE — Progress Notes (Signed)
Right lower extremity venous duplex has been completed. Preliminary results can be found in CV Proc through chart review.  Results were given to Dr. Regenia Skeeter.  05/07/19 11:49 AM [name] RVT

## 2019-05-07 NOTE — ED Notes (Signed)
Pt given dc instructions pt verbalizes understanding.  

## 2019-05-07 NOTE — Discharge Instructions (Addendum)
There is no indication for fracture or blood clot at this time.  The x-rays showed only arthritis, nothing broken.  You are giving you an Ace wrap to use on your right knee to help with the discomfort.  Use it intermittently during the day for several hours at a time if needed to help support the knee, while walking.  It will help to elevate both legs above your heart for several hours each day, and at night.  This can decrease swelling.  Clean the wounds on her hands well with soap and water daily and apply new bandages.  Follow-up with your primary care doctor if needed for problems.

## 2019-05-09 NOTE — Progress Notes (Signed)
Remote pacemaker transmission.   

## 2019-05-27 DIAGNOSIS — R310 Gross hematuria: Secondary | ICD-10-CM | POA: Diagnosis not present

## 2019-05-27 DIAGNOSIS — M25561 Pain in right knee: Secondary | ICD-10-CM | POA: Diagnosis not present

## 2019-05-27 DIAGNOSIS — R7301 Impaired fasting glucose: Secondary | ICD-10-CM | POA: Diagnosis not present

## 2019-05-27 DIAGNOSIS — C9000 Multiple myeloma not having achieved remission: Secondary | ICD-10-CM | POA: Diagnosis not present

## 2019-05-27 DIAGNOSIS — E785 Hyperlipidemia, unspecified: Secondary | ICD-10-CM | POA: Diagnosis not present

## 2019-05-27 DIAGNOSIS — R69 Illness, unspecified: Secondary | ICD-10-CM | POA: Diagnosis not present

## 2019-05-27 DIAGNOSIS — I25118 Atherosclerotic heart disease of native coronary artery with other forms of angina pectoris: Secondary | ICD-10-CM | POA: Diagnosis not present

## 2019-05-27 DIAGNOSIS — I10 Essential (primary) hypertension: Secondary | ICD-10-CM | POA: Diagnosis not present

## 2019-05-27 DIAGNOSIS — M1990 Unspecified osteoarthritis, unspecified site: Secondary | ICD-10-CM | POA: Diagnosis not present

## 2019-05-30 DIAGNOSIS — R69 Illness, unspecified: Secondary | ICD-10-CM | POA: Diagnosis not present

## 2019-06-03 ENCOUNTER — Ambulatory Visit: Payer: Medicare HMO | Admitting: Student

## 2019-06-03 ENCOUNTER — Other Ambulatory Visit: Payer: Self-pay

## 2019-06-03 ENCOUNTER — Ambulatory Visit (INDEPENDENT_AMBULATORY_CARE_PROVIDER_SITE_OTHER): Payer: Medicare HMO | Admitting: *Deleted

## 2019-06-03 VITALS — BP 112/64 | HR 60 | Ht 69.0 in | Wt 225.0 lb

## 2019-06-03 DIAGNOSIS — I10 Essential (primary) hypertension: Secondary | ICD-10-CM

## 2019-06-03 DIAGNOSIS — I4819 Other persistent atrial fibrillation: Secondary | ICD-10-CM | POA: Diagnosis not present

## 2019-06-03 DIAGNOSIS — I495 Sick sinus syndrome: Secondary | ICD-10-CM

## 2019-06-03 DIAGNOSIS — I471 Supraventricular tachycardia: Secondary | ICD-10-CM | POA: Diagnosis not present

## 2019-06-03 DIAGNOSIS — Z950 Presence of cardiac pacemaker: Secondary | ICD-10-CM

## 2019-06-03 LAB — CUP PACEART INCLINIC DEVICE CHECK
Battery Remaining Longevity: 0 mo
Battery Voltage: 2.6 V
Brady Statistic RA Percent Paced: 0.01 %
Brady Statistic RV Percent Paced: 94 %
Date Time Interrogation Session: 20200921144624
Implantable Lead Implant Date: 20111021
Implantable Lead Implant Date: 20111021
Implantable Lead Location: 753859
Implantable Lead Location: 753860
Implantable Pulse Generator Implant Date: 20111021
Lead Channel Impedance Value: 462.5 Ohm
Lead Channel Impedance Value: 475 Ohm
Lead Channel Pacing Threshold Amplitude: 0.625 V
Lead Channel Pacing Threshold Amplitude: 0.875 V
Lead Channel Pacing Threshold Pulse Width: 0.5 ms
Lead Channel Pacing Threshold Pulse Width: 0.5 ms
Lead Channel Sensing Intrinsic Amplitude: 12 mV
Lead Channel Sensing Intrinsic Amplitude: 2.6 mV
Lead Channel Setting Pacing Amplitude: 1.125
Lead Channel Setting Pacing Amplitude: 1.625
Lead Channel Setting Pacing Pulse Width: 0.5 ms
Lead Channel Setting Sensing Sensitivity: 2 mV
Pulse Gen Model: 2110
Pulse Gen Serial Number: 7163077

## 2019-06-03 NOTE — Progress Notes (Signed)
Electrophysiology Office Note Date: 06/03/2019  ID:  [name], DOB 1941-03-25, MRN [id number]  PCP: [name] Solian, MD Primary Cardiologist: No primary care provider on file. Electrophysiologist: None  CC: Pacemaker follow-up  [name] is a 78 y.o. male seen today for Dr. Caryl [name]. He presents today for routine electrophysiology followup.  Since last being seen in our clinic, the patient reports doing well overall. He fell at the end of August and had a large hematoma on his R knee. ED work up negative for fx. He denies chest pain, palpitations, dyspnea, PND, orthopnea, nausea, vomiting, dizziness, syncope, edema, weight gain, or early satiety.  Device History: St. Jude Dual Chamber PPM implanted 2011 for CHB  Past Medical History:  Diagnosis Date  . Atrial flutter ([place])    A. s/p prior ablation;  B.  s/p CTI ablation 10/24/10 (Dr. Caryl [name])  . CAD (coronary artery disease)    A.  s/p CFX in the past;   B.  cath 06/2010: LAD 30-40%, D1 50%, OM1 50%, AVCFX stent 30%, dCFX 70-80% (med Rx), mRCA 40%;      C.  Echo 06/2010: EF 60-65%, mod LVH; mild LAE     . Complete heart block ([facility])    a. s/p STJ pacemaker  . Cubital tunnel syndrome   . Degenerative disc disease   . Heart block   . HOH (hard of hearing)   . Hx of adenomatous colonic polyps   . Hyperlipidemia   . Hypertension   . Multiple myeloma   . Persistent atrial fibrillation   . Sleep apnea   . Venous insufficiency    Past Surgical History:  Procedure Laterality Date  . CARDIOVERSION N/A 04/20/2018   Procedure: CARDIOVERSION;  Surgeon: [name] Latch, MD;  Location: [facility];  Service: Cardiovascular;  Laterality: N/A;  . COLONOSCOPY  10-07-2009   TA polyp, tics, hems   . CORONARY ANGIOPLASTY WITH STENT PLACEMENT  2005  . PACEMAKER PLACEMENT  2011  . POLYPECTOMY    . Right olecranon nerve surgery    . VENTRAL HERNIA REPAIR      Current Outpatient Medications  Medication Sig Dispense Refill  .  atorvastatin (LIPITOR) 10 MG tablet Take 10 mg by mouth daily.    Marland Kitchen EPINEPHrine 0.3 mg/0.3 mL IJ SOAJ injection Inject 0.3 mg into the muscle once as needed for anaphylaxis.    . finasteride (PROSCAR) 5 MG tablet Take 5 mg by mouth daily.    . furosemide (LASIX) 40 MG tablet Take 1 tablet (40 mg total) by mouth daily. 30 tablet 6  . ketotifen (ALAWAY) 0.025 % ophthalmic solution Place 1 drop into both eyes 2 (two) times daily as needed (for dry eyes).     Marland Kitchen losartan (COZAAR) 100 MG tablet Take 100 mg by mouth daily.    . Magnesium 200 MG TABS Take 1 tablet (200 mg total) by mouth daily. 30 each   . Multiple Vitamin (MULTIVITAMIN) capsule Take 1 capsule by mouth daily.      . mupirocin ointment (BACTROBAN) 2 % Apply 1 application topically 2 (two) times daily as needed (for wound care).    . nitroGLYCERIN (NITROSTAT) 0.4 MG SL tablet PLACE ONE TABLET UNDER THE TONGUE EVERY 5 MINUTES AS NEEDED FOR CHEST PAIN 75 tablet 0  . NON FORMULARY CPAP Machine at night    . Omega-3 Fatty Acids (FISH OIL) 1200 MG CAPS Take 1,200 mg by mouth daily.     . Potassium Chloride ER 20  MEQ TBCR TAKE 1 TABLET BY MOUTH ONCE DAILY 90 tablet 2  . thiamine 100 MG tablet Take 100 mg by mouth daily.    . Vitamins A & D (VITAMIN A & D) ointment Apply 1 application topically as needed for dry skin.    Marland Kitchen XARELTO 20 MG TABS tablet TAKE 1 TABLET BY MOUTH ONCE DAILY WITH SUPPER 90 tablet 0   No current facility-administered medications for this visit.     Allergies:   Bee venom   Social History: Social History   Socioeconomic History  . Marital status: Divorced    Spouse name: Not on file  . Number of children: Not on file  . Years of education: Not on file  . Highest education level: Not on file  Occupational History  . Occupation: Retired from Retail banker: RETIRED  . Occupation: still farms part time  Social Needs  . Financial resource strain: Not on file  . Food insecurity    Worry: Not on  file    Inability: Not on file  . Transportation needs    Medical: Not on file    Non-medical: Not on file  Tobacco Use  . Smoking status: Former Smoker    Packs/day: 1.00    Years: 49.00    Pack years: 49.00    Types: Cigarettes    Quit date: 09/12/2004    Years since quitting: 14.7  . Smokeless tobacco: Former Systems developer  . Tobacco comment: started at age 21; smoked 1 ppd; quit in 2006  Substance and Sexual Activity  . Alcohol use: Yes    Alcohol/week: 28.0 standard drinks    Types: 28 Standard drinks or equivalent per week    Comment: occasional  . Drug use: No  . Sexual activity: Not Currently  Lifestyle  . Physical activity    Days per week: Not on file    Minutes per session: Not on file  . Stress: Not on file  Relationships  . Social Herbalist on phone: Not on file    Gets together: Not on file    Attends religious service: Not on file    Active member of club or organization: Not on file    Attends meetings of clubs or organizations: Not on file    Relationship status: Not on file  . Intimate partner violence    Fear of current or ex partner: Not on file    Emotionally abused: Not on file    Physically abused: Not on file    Forced sexual activity: Not on file  Other Topics Concern  . Not on file  Social History Narrative  . Not on file    Family History: Family History  Problem Relation Age of Onset  . Heart disease Father   . Cancer Father        prostate  . Heart disease Brother   . Colon cancer Neg Hx      Review of Systems: All other systems reviewed and are otherwise negative except as noted above.  Physical Exam: Vitals:   06/03/19 1038  BP: 112/64  Pulse: 60  Weight: 225 lb (102.1 kg)  Height: _0  (1.753 m)     GEN- The patient is elderly appearing, alert and oriented x 3 today.   HEENT: normocephalic, atraumatic; sclera clear, conjunctiva pink; hearing intact; oropharynx clear; neck supple  Lungs- Clear to ausculation  bilaterally, normal work of breathing.  No wheezes, rales, rhonchi Heart-  Regular rate and rhythm, no murmurs, rubs or gallops  GI- soft, non-tender, non-distended, bowel sounds present  Extremities- no clubbing or cyanosis. R knee with hematoma that he states has improved since fall last month.  MS- no significant deformity or atrophy Skin- warm and dry, no rash or lesion; PPM pocket well healed Psych- euthymic mood, full affect Neuro- strength and sensation are intact  PPM Interrogation- reviewed in detail today,  See PACEART report  EKG:  EKG is ordered today. The ekg ordered today shows V-pacing at 60 bpm with underlying Afib.  Recent Labs: 06/15/2018: Magnesium 2.0 12/27/2018: ALT 44; BUN 7; Creatinine 0.76; Hemoglobin 14.9; Platelet Count 215; Potassium 4.2; Sodium 136   Wt Readings from Last 3 Encounters:  06/03/19 225 lb (102.1 kg)  05/07/19 222 lb (100.7 kg)  07/05/18 225 lb 12.8 oz (102.4 kg)     Other studies Reviewed: Additional studies/ records that were reviewed today include: Last office note,  ED visit notes and imaging from 05/07/19, Labwork from 12/2018  Assessment and Plan:  1.  Symptomatic complete heart block s/p St. Jude PPM  Normal PPM function See Pace Art report No changes today He has < 3 months left to ERI. Continue monthly battery checks. Plan to see Dr. Caryl [name] in 3 months to discuss gen change.   2. Permanent Afib/flutter Continue Xarelto CHA2DS2/VASc is 5  3. Chronic diastolic CHF Echo 04/5026 LVEF normal.  Euvolemic on exam. Continue lasix 40 mg daily With stable symptoms, consider renewing echo next year, soon with worsening symptoms.   Current medicines are reviewed at length with the patient today.   The patient does not have concerns regarding his medicines.  The following changes were made today:  none  Labs/ tests ordered today include:  Orders Placed This Encounter  Procedures  . CUP PACEART [place]  . EKG 12-Lead     Disposition:   Follow up with Dr. Caryl [name] in 3 months to discuss gen change.   Jacalyn Lefevre, PA-C  06/03/2019 1:35 PM  [place] [address] [phone] (office) [phone] (fax)

## 2019-06-03 NOTE — Patient Instructions (Signed)
Medication Instructions:  Your physician recommends that you continue on your current medications as directed. Please refer to the Current Medication list given to you today.  If you need a refill on your cardiac medications before your next appointment, please call your pharmacy.   Lab work: NONE ORDERED  TODAY   If you have labs (blood work) drawn today and your tests are completely normal, you will receive your results only by: Marland Kitchen MyChart Message (if you have MyChart) OR . A paper copy in the mail If you have any lab test that is abnormal or we need to change your treatment, we will call you to review the results.  Testing/Procedures: NONE ORDERED  TODAY   Follow-Up: At [facility], you and your health needs are our priority.  As part of our continuing mission to provide you with exceptional heart care, we have created designated Provider Care Teams.  These Care Teams include your primary Cardiologist (physician) and Advanced Practice Providers (APPs -  Physician Assistants and Nurse Practitioners) who all work together to provide you with the care you need, when you need it.  WITH DR Caryl Comes 3 MONTHS NEAR BATTERY REPLACEMENT    Any Other Special Instructions Will Be Listed Below (If Applicable).

## 2019-06-04 LAB — CUP PACEART REMOTE DEVICE CHECK
Date Time Interrogation Session: 20200922071536
Implantable Lead Implant Date: 20111021
Implantable Lead Implant Date: 20111021
Implantable Lead Location: 753859
Implantable Lead Location: 753860
Implantable Pulse Generator Implant Date: 20111021
Pulse Gen Model: 2110
Pulse Gen Serial Number: 7163077

## 2019-06-07 ENCOUNTER — Telehealth: Payer: Self-pay | Admitting: Internal Medicine

## 2019-06-07 NOTE — Telephone Encounter (Signed)
Returned patient's phone call regarding rescheduling an appointment, left a voicemail. 

## 2019-06-10 ENCOUNTER — Encounter: Payer: Self-pay | Admitting: Cardiology

## 2019-06-10 ENCOUNTER — Telehealth: Payer: Self-pay | Admitting: Internal Medicine

## 2019-06-10 NOTE — Progress Notes (Signed)
Remote pacemaker transmission.   

## 2019-06-10 NOTE — Telephone Encounter (Signed)
Called pt per 9/24 sch message - pt aware of new time.

## 2019-06-10 NOTE — Addendum Note (Signed)
Addended by: Tiajuana Amass on: 06/10/2019 01:49 PM   Modules accepted: Level of Service

## 2019-06-18 DIAGNOSIS — R69 Illness, unspecified: Secondary | ICD-10-CM | POA: Diagnosis not present

## 2019-06-27 ENCOUNTER — Other Ambulatory Visit: Payer: Self-pay

## 2019-06-27 ENCOUNTER — Inpatient Hospital Stay: Payer: Medicare HMO | Attending: Internal Medicine

## 2019-06-27 ENCOUNTER — Other Ambulatory Visit: Payer: Medicare HMO

## 2019-06-27 DIAGNOSIS — Z955 Presence of coronary angioplasty implant and graft: Secondary | ICD-10-CM | POA: Insufficient documentation

## 2019-06-27 DIAGNOSIS — Z79899 Other long term (current) drug therapy: Secondary | ICD-10-CM | POA: Insufficient documentation

## 2019-06-27 DIAGNOSIS — I4891 Unspecified atrial fibrillation: Secondary | ICD-10-CM | POA: Diagnosis not present

## 2019-06-27 DIAGNOSIS — I2510 Atherosclerotic heart disease of native coronary artery without angina pectoris: Secondary | ICD-10-CM | POA: Insufficient documentation

## 2019-06-27 DIAGNOSIS — Z950 Presence of cardiac pacemaker: Secondary | ICD-10-CM | POA: Diagnosis not present

## 2019-06-27 DIAGNOSIS — E785 Hyperlipidemia, unspecified: Secondary | ICD-10-CM | POA: Insufficient documentation

## 2019-06-27 DIAGNOSIS — I10 Essential (primary) hypertension: Secondary | ICD-10-CM | POA: Diagnosis not present

## 2019-06-27 DIAGNOSIS — I872 Venous insufficiency (chronic) (peripheral): Secondary | ICD-10-CM | POA: Diagnosis not present

## 2019-06-27 DIAGNOSIS — Z7901 Long term (current) use of anticoagulants: Secondary | ICD-10-CM | POA: Insufficient documentation

## 2019-06-27 DIAGNOSIS — M25561 Pain in right knee: Secondary | ICD-10-CM | POA: Insufficient documentation

## 2019-06-27 DIAGNOSIS — R5383 Other fatigue: Secondary | ICD-10-CM | POA: Diagnosis not present

## 2019-06-27 DIAGNOSIS — C9000 Multiple myeloma not having achieved remission: Secondary | ICD-10-CM | POA: Diagnosis present

## 2019-06-27 LAB — CMP (CANCER CENTER ONLY)
ALT: 28 U/L (ref 0–44)
AST: 38 U/L (ref 15–41)
Albumin: 3.7 g/dL (ref 3.5–5.0)
Alkaline Phosphatase: 76 U/L (ref 38–126)
Anion gap: 13 (ref 5–15)
BUN: 9 mg/dL (ref 8–23)
CO2: 23 mmol/L (ref 22–32)
Calcium: 9.7 mg/dL (ref 8.9–10.3)
Chloride: 102 mmol/L (ref 98–111)
Creatinine: 0.73 mg/dL (ref 0.61–1.24)
GFR, Est AFR Am: >60 mL/min (ref >60)
GFR, Estimated: >60 mL/min (ref >60)
Glucose, Bld: 89 mg/dL (ref 70–99)
Potassium: 4.3 mmol/L (ref 3.5–5.1)
Sodium: 138 mmol/L (ref 135–145)
Total Bilirubin: 1.2 mg/dL (ref 0.3–1.2)
Total Protein: 8.2 g/dL — ABNORMAL HIGH (ref 6.5–8.1)

## 2019-06-27 LAB — CBC WITH DIFFERENTIAL (CANCER CENTER ONLY)
Abs Immature Granulocytes: 0.03 10*3/uL (ref 0.00–0.07)
Basophils Absolute: 0.1 10*3/uL (ref 0.0–0.1)
Basophils Relative: 1 %
Eosinophils Absolute: 0.3 10*3/uL (ref 0.0–0.5)
Eosinophils Relative: 4 %
HCT: 41 % (ref 39.0–52.0)
Hemoglobin: 13.9 g/dL (ref 13.0–17.0)
Immature Granulocytes: 1 %
Lymphocytes Relative: 15 %
Lymphs Abs: 0.9 10*3/uL (ref 0.7–4.0)
MCH: 33.2 pg (ref 26.0–34.0)
MCHC: 33.9 g/dL (ref 30.0–36.0)
MCV: 97.9 fL (ref 80.0–100.0)
Monocytes Absolute: 0.9 10*3/uL (ref 0.1–1.0)
Monocytes Relative: 14 %
Neutro Abs: 4.3 10*3/uL (ref 1.7–7.7)
Neutrophils Relative %: 65 %
Platelet Count: 188 10*3/uL (ref 150–400)
RBC: 4.19 MIL/uL — ABNORMAL LOW (ref 4.22–5.81)
RDW: 11.9 % (ref 11.5–15.5)
WBC Count: 6.5 10*3/uL (ref 4.0–10.5)
nRBC: 0 % (ref 0.0–0.2)

## 2019-06-27 LAB — LACTATE DEHYDROGENASE: LDH: 173 U/L (ref 98–192)

## 2019-06-28 LAB — BETA 2 MICROGLOBULIN, SERUM: Beta-2 Microglobulin: 1.8 mg/L (ref 0.6–2.4)

## 2019-06-28 LAB — IGG, IGA, IGM
IgA: 134 mg/dL (ref 61–437)
IgG (Immunoglobin G), Serum: 495 mg/dL — ABNORMAL LOW (ref 603–1613)
IgM (Immunoglobulin M), Srm: 3544 mg/dL — ABNORMAL HIGH (ref 15–143)

## 2019-06-28 LAB — KAPPA/LAMBDA LIGHT CHAINS
Kappa free light chain: 15.6 mg/L (ref 3.3–19.4)
Kappa, lambda light chain ratio: 0.21 — ABNORMAL LOW (ref 0.26–1.65)
Lambda free light chains: 75.8 mg/L — ABNORMAL HIGH (ref 5.7–26.3)

## 2019-07-03 ENCOUNTER — Other Ambulatory Visit: Payer: Self-pay | Admitting: Physician Assistant

## 2019-07-04 ENCOUNTER — Encounter: Payer: Self-pay | Admitting: Internal Medicine

## 2019-07-04 ENCOUNTER — Telehealth: Payer: Self-pay | Admitting: Internal Medicine

## 2019-07-04 ENCOUNTER — Other Ambulatory Visit: Payer: Self-pay

## 2019-07-04 ENCOUNTER — Ambulatory Visit (INDEPENDENT_AMBULATORY_CARE_PROVIDER_SITE_OTHER): Payer: Medicare HMO | Admitting: *Deleted

## 2019-07-04 ENCOUNTER — Inpatient Hospital Stay: Payer: Medicare HMO | Admitting: Internal Medicine

## 2019-07-04 VITALS — BP 138/71 | HR 80 | Temp 98.0°F | Resp 18 | Ht 69.0 in | Wt 227.5 lb

## 2019-07-04 DIAGNOSIS — I10 Essential (primary) hypertension: Secondary | ICD-10-CM

## 2019-07-04 DIAGNOSIS — I495 Sick sinus syndrome: Secondary | ICD-10-CM

## 2019-07-04 DIAGNOSIS — I2510 Atherosclerotic heart disease of native coronary artery without angina pectoris: Secondary | ICD-10-CM | POA: Diagnosis not present

## 2019-07-04 DIAGNOSIS — E785 Hyperlipidemia, unspecified: Secondary | ICD-10-CM | POA: Diagnosis not present

## 2019-07-04 DIAGNOSIS — I4891 Unspecified atrial fibrillation: Secondary | ICD-10-CM | POA: Diagnosis not present

## 2019-07-04 DIAGNOSIS — D472 Monoclonal gammopathy: Secondary | ICD-10-CM

## 2019-07-04 DIAGNOSIS — C9000 Multiple myeloma not having achieved remission: Secondary | ICD-10-CM

## 2019-07-04 DIAGNOSIS — Z950 Presence of cardiac pacemaker: Secondary | ICD-10-CM | POA: Diagnosis not present

## 2019-07-04 DIAGNOSIS — Z955 Presence of coronary angioplasty implant and graft: Secondary | ICD-10-CM | POA: Diagnosis not present

## 2019-07-04 DIAGNOSIS — I872 Venous insufficiency (chronic) (peripheral): Secondary | ICD-10-CM | POA: Diagnosis not present

## 2019-07-04 DIAGNOSIS — R5383 Other fatigue: Secondary | ICD-10-CM | POA: Diagnosis not present

## 2019-07-04 DIAGNOSIS — M25561 Pain in right knee: Secondary | ICD-10-CM | POA: Diagnosis not present

## 2019-07-04 DIAGNOSIS — I4819 Other persistent atrial fibrillation: Secondary | ICD-10-CM

## 2019-07-04 NOTE — Telephone Encounter (Signed)
Gave patient avs report and appointments for April  °

## 2019-07-04 NOTE — Progress Notes (Signed)
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   INTERVAL HISTORY: [name] 78 y.o. male returns to the clinic today for 6 months follow-up visit.  The patient is feeling fine today with no concerning complaints except for the pain in the right knee after a fall few years ago.  He denied having any current chest pain, shortness of breath, cough or hemoptysis.  He denied having any fever or chills.  He has no nausea, vomiting, diarrhea or constipation.  He has no headache or visual changes.  He had repeat myeloma panel performed recently and he is here for evaluation and discussion of his lab results and treatment options.  MEDICAL HISTORY: Past Medical History:  Diagnosis Date  . Atrial flutter ([place])    A. s/p prior ablation;  B.  s/p CTI ablation 10/24/10 (Dr. Caryl Comes)  . CAD (coronary artery disease)    A.  s/p CFX in the past;   B.  cath 06/2010: LAD 30-40%, D1 50%, OM1 50%, AVCFX stent 30%, dCFX 70-80% (med Rx), mRCA 40%;      C.  Echo 06/2010: EF 60-65%, mod LVH; mild LAE     . Complete heart block (HCC)    a. s/p STJ pacemaker  . Cubital tunnel syndrome   . Degenerative disc disease   . Heart block   . HOH (hard of hearing)   . Hx of adenomatous colonic polyps   . Hyperlipidemia   . Hypertension   . Multiple myeloma   . Persistent atrial fibrillation   . Sleep apnea   . Venous insufficiency     ALLERGIES:  is allergic to bee venom.  MEDICATIONS:  Current Outpatient Medications  Medication Sig Dispense Refill  . atorvastatin (LIPITOR) 10 MG tablet Take 10 mg by mouth daily.    Marland Kitchen EPINEPHrine 0.3 mg/0.3 mL IJ SOAJ injection Inject 0.3 mg into the muscle once as needed for anaphylaxis.    . finasteride (PROSCAR) 5 MG tablet Take 5 mg by  mouth daily.    . furosemide (LASIX) 40 MG tablet Take 1 tablet (40 mg total) by mouth daily. 30 tablet 6  . ketotifen (ALAWAY) 0.025 % ophthalmic solution Place 1 drop into both eyes 2 (two) times daily as needed (for dry eyes).     Marland Kitchen losartan (COZAAR) 100 MG tablet Take 100 mg by mouth daily.    . Magnesium 200 MG TABS Take 1 tablet (200 mg total) by mouth daily. 30 each   . Multiple Vitamin (MULTIVITAMIN) capsule Take 1 capsule by mouth daily.      . mupirocin ointment (BACTROBAN) 2 % Apply 1 application topically 2 (two) times daily as needed (for wound care).    . nitroGLYCERIN (NITROSTAT) 0.4 MG SL tablet PLACE ONE TABLET UNDER THE TONGUE EVERY 5 MINUTES AS NEEDED FOR CHEST PAIN 75 tablet 0  . NON FORMULARY CPAP Machine at night    . Omega-3 Fatty Acids (FISH OIL) 1200 MG CAPS Take 1,200 mg by mouth daily.     . Potassium Chloride ER 20 MEQ TBCR TAKE 1 TABLET BY MOUTH ONCE DAILY 90 tablet 2  . thiamine 100 MG tablet Take 100 mg by mouth daily.    . Vitamins A & D (VITAMIN A & D) ointment Apply 1  application topically as needed for dry skin.    Marland Kitchen XARELTO 20 MG TABS tablet TAKE 1 TABLET BY MOUTH ONCE DAILY WITH SUPPER 90 tablet 0   No current facility-administered medications for this visit.     SURGICAL HISTORY:  Past Surgical History:  Procedure Laterality Date  . CARDIOVERSION N/A 04/20/2018   Procedure: CARDIOVERSION;  Surgeon: Skeet Latch, MD;  Location: [facility];  Service: Cardiovascular;  Laterality: N/A;  . COLONOSCOPY  10-07-2009   TA polyp, tics, hems   . CORONARY ANGIOPLASTY WITH STENT PLACEMENT  2005  . PACEMAKER PLACEMENT  2011  . POLYPECTOMY    . Right olecranon nerve surgery    . VENTRAL HERNIA REPAIR      REVIEW OF SYSTEMS:  A comprehensive review of systems was negative except for: Constitutional: positive for fatigue Musculoskeletal: positive for arthralgias   PHYSICAL EXAMINATION: General appearance: alert, cooperative, fatigued and no distress Head:  Normocephalic, without obvious abnormality, atraumatic Neck: no adenopathy, no JVD, supple, symmetrical, trachea midline and thyroid not enlarged, symmetric, no tenderness/mass/nodules Lymph nodes: Cervical, supraclavicular, and axillary nodes normal. Resp: clear to auscultation bilaterally Back: symmetric, no curvature. ROM normal. No CVA tenderness. Cardio: regular rate and rhythm, S1, S2 normal, no murmur, click, rub or gallop GI: soft, non-tender; bowel sounds normal; no masses,  no organomegaly Extremities: extremities normal, atraumatic, no cyanosis or edema  ECOG PERFORMANCE STATUS: 1 - Symptomatic but completely ambulatory  Blood pressure 138/71, pulse 80, temperature 98 F (36.7 C), temperature source Oral, resp. rate 18, height '5\' 9"'$  (1.753 m), weight 227 lb 8 oz (103.2 kg), SpO2 98 %.  LABORATORY DATA: Lab Results  Component Value Date   WBC 6.5 06/27/2019   HGB 13.9 06/27/2019   HCT 41.0 06/27/2019   MCV 97.9 06/27/2019   PLT 188 06/27/2019      Chemistry      Component Value Date/Time   NA 138 06/27/2019 0940   NA 136 06/06/2017 1058   K 4.3 06/27/2019 0940   K 4.0 06/06/2017 1058   CL 102 06/27/2019 0940   CL 97 (L) 12/03/2012 1015   CO2 23 06/27/2019 0940   CO2 24 06/06/2017 1058   BUN 9 06/27/2019 0940   BUN 5.6 (L) 06/06/2017 1058   CREATININE 0.73 06/27/2019 0940   CREATININE 0.8 06/06/2017 1058      Component Value Date/Time   CALCIUM 9.7 06/27/2019 0940   CALCIUM 9.1 06/06/2017 1058   ALKPHOS 76 06/27/2019 0940   ALKPHOS 61 06/06/2017 1058   AST 38 06/27/2019 0940   AST 36 (H) 06/06/2017 1058   ALT 28 06/27/2019 0940   ALT 23 06/06/2017 1058   BILITOT 1.2 06/27/2019 0940   BILITOT 0.46 06/06/2017 1058       RADIOGRAPHIC STUDIES: No results found.  ASSESSMENT AND PLAN:  This is a very pleasant 78 years old white male with history of smoldering multiple myeloma diagnosed in August 2011 and has been observation since that time. The patient  has no concerning complaints today except for the fatigue and the right knee pain. His myeloma panel is unremarkable for any disease progression.  I discussed the lab results with the patient. I recommended for him to continue on observation with repeat myeloma panel in 6 months. For the atrial fibrillation he will continue his current management by cardiology. He was advised to call immediately if he has any concerning symptoms in the interval. The patient voices understanding of current disease status and treatment options  and is in agreement with the current care plan.  All questions were answered. The patient knows to call the clinic with any problems, questions or concerns. We can certainly see the patient much sooner if necessary. I spent 10 minutes counseling the patient face to face. The total time spent in the appointment was 15 minutes.  Disclaimer: This note was dictated with voice recognition software. Similar sounding words can inadvertently be transcribed and may not be corrected upon review.

## 2019-07-05 ENCOUNTER — Telehealth: Payer: Self-pay

## 2019-07-05 NOTE — Telephone Encounter (Signed)
Pt agreed to send a manual transmission with his home monitor. I agreed to call him to let him know I received his transmission.

## 2019-07-05 NOTE — Telephone Encounter (Signed)
I left the pt a message at his request letting him know that we did not receive his transmission from his home monitor. I gave the pt the number to [facility] support and also gave him my direct office number for any additional questions.

## 2019-07-06 LAB — CUP PACEART REMOTE DEVICE CHECK
Battery Remaining Longevity: 1 mo
Battery Remaining Percentage: 0.5 %
Battery Voltage: 2.59 V
Brady Statistic AP VP Percent: 0 %
Brady Statistic AP VS Percent: 0 %
Brady Statistic AS VP Percent: 100 %
Brady Statistic AS VS Percent: 0 %
Brady Statistic RA Percent Paced: 1 %
Brady Statistic RV Percent Paced: 94 %
Date Time Interrogation Session: 20201023154615
Implantable Lead Implant Date: 20111021
Implantable Lead Implant Date: 20111021
Implantable Lead Location: 753859
Implantable Lead Location: 753860
Implantable Pulse Generator Implant Date: 20111021
Lead Channel Impedance Value: 450 Ohm
Lead Channel Impedance Value: 480 Ohm
Lead Channel Pacing Threshold Amplitude: 0.625 V
Lead Channel Pacing Threshold Amplitude: 0.875 V
Lead Channel Pacing Threshold Pulse Width: 0.5 ms
Lead Channel Pacing Threshold Pulse Width: 0.5 ms
Lead Channel Sensing Intrinsic Amplitude: 12 mV
Lead Channel Sensing Intrinsic Amplitude: 2.4 mV
Lead Channel Setting Pacing Amplitude: 1.125
Lead Channel Setting Pacing Amplitude: 1.625
Lead Channel Setting Pacing Pulse Width: 0.5 ms
Lead Channel Setting Sensing Sensitivity: 2 mV
Pulse Gen Model: 2110
Pulse Gen Serial Number: 7163077

## 2019-07-16 NOTE — Addendum Note (Signed)
Addended by: Douglass Rivers D on: 07/16/2019 09:31 AM   Modules accepted: Level of Service

## 2019-07-16 NOTE — Progress Notes (Signed)
Remote pacemaker transmission.   

## 2019-08-02 ENCOUNTER — Ambulatory Visit (INDEPENDENT_AMBULATORY_CARE_PROVIDER_SITE_OTHER): Payer: Medicare HMO | Admitting: *Deleted

## 2019-08-02 DIAGNOSIS — I495 Sick sinus syndrome: Secondary | ICD-10-CM | POA: Diagnosis not present

## 2019-08-02 LAB — CUP PACEART REMOTE DEVICE CHECK
Battery Remaining Longevity: 1 mo
Battery Remaining Percentage: 0.5 %
Battery Voltage: 2.59 V
Brady Statistic AP VP Percent: 0 %
Brady Statistic AP VS Percent: 0 %
Brady Statistic AS VP Percent: 100 %
Brady Statistic AS VS Percent: 0 %
Brady Statistic RA Percent Paced: 1 %
Brady Statistic RV Percent Paced: 94 %
Date Time Interrogation Session: 20201120062955
Implantable Lead Implant Date: 20111021
Implantable Lead Implant Date: 20111021
Implantable Lead Location: 753859
Implantable Lead Location: 753860
Implantable Pulse Generator Implant Date: 20111021
Lead Channel Impedance Value: 460 Ohm
Lead Channel Impedance Value: 490 Ohm
Lead Channel Pacing Threshold Amplitude: 0.625 V
Lead Channel Pacing Threshold Amplitude: 0.875 V
Lead Channel Pacing Threshold Pulse Width: 0.5 ms
Lead Channel Pacing Threshold Pulse Width: 0.5 ms
Lead Channel Sensing Intrinsic Amplitude: 12 mV
Lead Channel Sensing Intrinsic Amplitude: 2.4 mV
Lead Channel Setting Pacing Amplitude: 1.125
Lead Channel Setting Pacing Amplitude: 1.625
Lead Channel Setting Pacing Pulse Width: 0.5 ms
Lead Channel Setting Sensing Sensitivity: 2 mV
Pulse Gen Model: 2110
Pulse Gen Serial Number: 7163077

## 2019-08-05 ENCOUNTER — Other Ambulatory Visit: Payer: Self-pay | Admitting: Internal Medicine

## 2019-08-05 NOTE — Telephone Encounter (Signed)
Prescription refill request for Xarelto received.   Last office visit: 06/03/2019, [place] Weight: 103.2 kg Age: 78 yo Scr: 0.76, 06/27/2019 CrCl: 115 ml/min   Prescription refill sent.

## 2019-08-27 NOTE — Progress Notes (Signed)
Remote pacemaker transmission.   

## 2019-08-31 ENCOUNTER — Other Ambulatory Visit: Payer: Self-pay | Admitting: Physician Assistant

## 2019-09-02 ENCOUNTER — Telehealth: Payer: Self-pay | Admitting: Emergency Medicine

## 2019-09-02 ENCOUNTER — Ambulatory Visit (INDEPENDENT_AMBULATORY_CARE_PROVIDER_SITE_OTHER): Payer: Medicare HMO | Admitting: *Deleted

## 2019-09-02 DIAGNOSIS — Z950 Presence of cardiac pacemaker: Secondary | ICD-10-CM

## 2019-09-02 LAB — CUP PACEART REMOTE DEVICE CHECK
Battery Remaining Longevity: 0 mo
Battery Voltage: 2.57 V
Brady Statistic AP VP Percent: 0 %
Brady Statistic AP VS Percent: 0 %
Brady Statistic AS VP Percent: 100 %
Brady Statistic AS VS Percent: 0 %
Brady Statistic RA Percent Paced: 1 %
Brady Statistic RV Percent Paced: 94 %
Date Time Interrogation Session: 20201221071725
Implantable Lead Implant Date: 20111021
Implantable Lead Implant Date: 20111021
Implantable Lead Location: 753859
Implantable Lead Location: 753860
Implantable Pulse Generator Implant Date: 20111021
Lead Channel Impedance Value: 450 Ohm
Lead Channel Impedance Value: 450 Ohm
Lead Channel Pacing Threshold Amplitude: 0.625 V
Lead Channel Pacing Threshold Amplitude: 0.875 V
Lead Channel Pacing Threshold Pulse Width: 0.5 ms
Lead Channel Pacing Threshold Pulse Width: 0.5 ms
Lead Channel Sensing Intrinsic Amplitude: 12 mV
Lead Channel Sensing Intrinsic Amplitude: 2.4 mV
Lead Channel Setting Pacing Amplitude: 1.125
Lead Channel Setting Pacing Amplitude: 1.625
Lead Channel Setting Pacing Pulse Width: 0.5 ms
Lead Channel Setting Sensing Sensitivity: 2 mV
Pulse Gen Model: 2110
Pulse Gen Serial Number: 7163077

## 2019-09-02 NOTE — Telephone Encounter (Signed)
LMOM to call office.  Patient has reached RRT on 08/09/19. Has follow up with Dr Caryl Comes 09/12/19.

## 2019-09-02 NOTE — Telephone Encounter (Signed)
Notified that PPM is at RRT. Will ensure he comes to f/u appointment on 09/12/19.

## 2019-09-11 DIAGNOSIS — I4891 Unspecified atrial fibrillation: Secondary | ICD-10-CM | POA: Insufficient documentation

## 2019-09-12 ENCOUNTER — Other Ambulatory Visit: Payer: Self-pay

## 2019-09-12 ENCOUNTER — Ambulatory Visit (INDEPENDENT_AMBULATORY_CARE_PROVIDER_SITE_OTHER): Payer: Medicare HMO | Admitting: Internal Medicine

## 2019-09-12 ENCOUNTER — Encounter: Payer: Self-pay | Admitting: Internal Medicine

## 2019-09-12 VITALS — BP 124/70 | HR 60 | Ht 69.0 in | Wt 222.0 lb

## 2019-09-12 DIAGNOSIS — I442 Atrioventricular block, complete: Secondary | ICD-10-CM

## 2019-09-12 DIAGNOSIS — I4821 Permanent atrial fibrillation: Secondary | ICD-10-CM

## 2019-09-12 DIAGNOSIS — Z950 Presence of cardiac pacemaker: Secondary | ICD-10-CM | POA: Diagnosis not present

## 2019-09-12 NOTE — Progress Notes (Signed)
    Patient Care Team: Avva, Ravisankar, MD as PCP - General (Internal Medicine) Murinson, Donald S, MD (Inactive) (Hematology and Oncology)   HPI  [name] is a 78 y.o. male seen in followup for a history of complete heart block status St Jude post pacemaker 2011;  Device at [facility]   He has a history of prior catheter ablation for atrial flutter. Hx coronary artery disease with prior stenting.   He has subsequently developed atrial fibrillation was hospitalized 06/08/2018 with the introduction of dofetilide.  He converted spontaneously when seen in the A. fib clinic and reverted to atrial fibrillation.  It was the impression of of A.S.-NP that he was unable to tell the difference between sinus and fibrillation and would have a high threshold for pursuing a strategy of rhythm control.    Activity markedly limited because of back and leg issues.  Some dyspnea.  No chest pain.  Some edema.  On Anticoagulation;  No bleeding issue      DATE TEST EF   1/17 MYOVIEW   65 % No ischemia  9/19 Echo   65 %         Date Cr Hgb  10/19 0.72 13.6  10/20  0.73 13.9    Past Medical History:  Diagnosis Date  . Atrial flutter (HCC)    A. s/p prior ablation;  B.  s/p CTI ablation 10/24/10 (Dr. )  . CAD (coronary artery disease)    A.  s/p CFX in the past;   B.  cath 06/2010: LAD 30-40%, D1 50%, OM1 50%, AVCFX stent 30%, dCFX 70-80% (med Rx), mRCA 40%;      C.  Echo 06/2010: EF 60-65%, mod LVH; mild LAE     . Complete heart block (HCC)    a. s/p STJ pacemaker  . Cubital tunnel syndrome   . Degenerative disc disease   . Heart block   . HOH (hard of hearing)   . Hx of adenomatous colonic polyps   . Hyperlipidemia   . Hypertension   . Multiple myeloma   . Persistent atrial fibrillation (HCC)   . Sleep apnea   . Venous insufficiency     Past Surgical History:  Procedure Laterality Date  . CARDIOVERSION N/A 04/20/2018   Procedure: CARDIOVERSION;  Surgeon: Greers Ferry, Tiffany, MD;   Location: [facility];  Service: Cardiovascular;  Laterality: N/A;  . COLONOSCOPY  10-07-2009   TA polyp, tics, hems   . CORONARY ANGIOPLASTY WITH STENT PLACEMENT  2005  . PACEMAKER PLACEMENT  2011  . POLYPECTOMY    . Right olecranon nerve surgery    . VENTRAL HERNIA REPAIR      Current Outpatient Medications  Medication Sig Dispense Refill  . atorvastatin (LIPITOR) 10 MG tablet Take 10 mg by mouth daily.    . EPINEPHrine 0.3 mg/0.3 mL IJ SOAJ injection Inject 0.3 mg into the muscle once as needed for anaphylaxis.    . finasteride (PROSCAR) 5 MG tablet Take 5 mg by mouth daily.    . furosemide (LASIX) 40 MG tablet Take 1 tablet by mouth once daily 30 tablet 6  . ketotifen (ALAWAY) 0.025 % ophthalmic solution Place 1 drop into both eyes 2 (two) times daily as needed (for dry eyes).     . losartan (COZAAR) 100 MG tablet Take 100 mg by mouth daily.    . Magnesium 200 MG TABS Take 1 tablet (200 mg total) by mouth daily. 30 each   . Multiple Vitamin (  MULTIVITAMIN) capsule Take 1 capsule by mouth daily.      . mupirocin ointment (BACTROBAN) 2 % Apply 1 application topically 2 (two) times daily as needed (for wound care).    . nitroGLYCERIN (NITROSTAT) 0.4 MG SL tablet PLACE ONE TABLET UNDER THE TONGUE EVERY 5 MINUTES AS NEEDED FOR CHEST PAIN 75 tablet 0  . NON FORMULARY CPAP Machine at night    . Omega-3 Fatty Acids (FISH OIL) 1200 MG CAPS Take 1,200 mg by mouth daily.     . Potassium Chloride ER 20 MEQ TBCR Take 1 tablet by mouth once daily 90 tablet 3  . thiamine 100 MG tablet Take 100 mg by mouth daily.    . Vitamins A & D (VITAMIN A & D) ointment Apply 1 application topically as needed for dry skin.    . XARELTO 20 MG TABS tablet TAKE 1 TABLET BY MOUTH ONCE DAILY WITH SUPPER 90 tablet 1   No current facility-administered medications for this visit.    Allergies  Allergen Reactions  . Bee Venom Anaphylaxis         Review of Systems negative except from HPI and PMH  Physical Exam    BP 124/70   Pulse 60   Ht 5' 9" (1.753 m)   Wt 222 lb (100.7 kg)   SpO2 95%   BMI 32.78 kg/m  Well developed and well nourished in no acute distress HENT normal Neck supple with JVP-flat Clear Device pocket well healed; without hematoma or erythema.  There is no tethering  Regular rate and rhythm, no murmur Abd-soft with active BS No Clubbing cyanosis tr  edema Skin-warm and dry A & Oriented  Grossly normal sensory and motor function  ECG Afib with Vp and CHB  @ 60    Assessment and plan   Complete heart block   Pacemaker implantation-St. Jude now at [facility] (08/09/19)  Orthostatic lightheadedness  Atrial fibrillation-permanent  Euvolemic.    On Anticoagulation;  No bleeding issues   Euvolemic continue current meds  Device is at [facility].. We have reviewed the benefits and risks of generator replacement.  These include but are not limited to lead fracture and infection.  The patient understands, agrees and is willing to proceed.     Some billing challenges.  Discussed with office manager  We spent more than 50% of our >25 min visit in face to face counseling regarding the above  

## 2019-09-12 NOTE — Patient Instructions (Signed)
Medication Instructions:  None ordered.  *If you need a refill on your cardiac medications before your next appointment, please call your pharmacy*  Lab Work: CBC and BMET   If you have labs (blood work) drawn today and your tests are completely normal, you will receive your results only by: Marland Kitchen MyChart Message (if you have MyChart) OR . A paper copy in the mail If you have any lab test that is abnormal or we need to change your treatment, we will call you to review the results.  Testing/Procedures:    See Instruction sheet  Follow-Up:  See AVS  At [facility], you and your health needs are our priority.  As part of our continuing mission to provide you with exceptional heart care, we have created designated Provider Care Teams.  These Care Teams include your primary Cardiologist (physician) and Advanced Practice Providers (APPs -  Physician Assistants and Nurse Practitioners) who all work together to provide you with the care you need, when you need it.

## 2019-09-12 NOTE — H&P (View-Only) (Signed)
Patient Care Team: [name], MD as PCP - General (Internal Medicine) Murinson, Haynes Bast, MD (Inactive) (Hematology and Oncology)   HPI  [name] is a 78 y.o. male seen in followup for a history of complete heart block status St Jude post pacemaker 2011;  Device at [facility]   He has a history of prior catheter ablation for atrial flutter. Hx coronary artery disease with prior stenting.   He has subsequently developed atrial fibrillation was hospitalized 06/08/2018 with the introduction of dofetilide.  He converted spontaneously when seen in the A. fib clinic and reverted to atrial fibrillation.  It was the impression of of A.S.-NP that he was unable to tell the difference between sinus and fibrillation and would have a high threshold for pursuing a strategy of rhythm control.    Activity markedly limited because of back and leg issues.  Some dyspnea.  No chest pain.  Some edema.  On Anticoagulation;  No bleeding issue      DATE TEST EF   1/17 MYOVIEW   65 % No ischemia  9/19 Echo   65 %         Date Cr Hgb  10/19 0.72 13.6  10/20  0.73 13.9    Past Medical History:  Diagnosis Date  . Atrial flutter ([place])    A. s/p prior ablation;  B.  s/p CTI ablation 10/24/10 (Dr. Caryl Comes)  . CAD (coronary artery disease)    A.  s/p CFX in the past;   B.  cath 06/2010: LAD 30-40%, D1 50%, OM1 50%, AVCFX stent 30%, dCFX 70-80% (med Rx), mRCA 40%;      C.  Echo 06/2010: EF 60-65%, mod LVH; mild LAE     . Complete heart block (HCC)    a. s/p STJ pacemaker  . Cubital tunnel syndrome   . Degenerative disc disease   . Heart block   . HOH (hard of hearing)   . Hx of adenomatous colonic polyps   . Hyperlipidemia   . Hypertension   . Multiple myeloma   . Persistent atrial fibrillation ([place])   . Sleep apnea   . Venous insufficiency     Past Surgical History:  Procedure Laterality Date  . CARDIOVERSION N/A 04/20/2018   Procedure: CARDIOVERSION;  Surgeon: Skeet Latch, MD;   Location: [facility];  Service: Cardiovascular;  Laterality: N/A;  . COLONOSCOPY  10-07-2009   TA polyp, tics, hems   . CORONARY ANGIOPLASTY WITH STENT PLACEMENT  2005  . PACEMAKER PLACEMENT  2011  . POLYPECTOMY    . Right olecranon nerve surgery    . VENTRAL HERNIA REPAIR      Current Outpatient Medications  Medication Sig Dispense Refill  . atorvastatin (LIPITOR) 10 MG tablet Take 10 mg by mouth daily.    Marland Kitchen EPINEPHrine 0.3 mg/0.3 mL IJ SOAJ injection Inject 0.3 mg into the muscle once as needed for anaphylaxis.    . finasteride (PROSCAR) 5 MG tablet Take 5 mg by mouth daily.    . furosemide (LASIX) 40 MG tablet Take 1 tablet by mouth once daily 30 tablet 6  . ketotifen (ALAWAY) 0.025 % ophthalmic solution Place 1 drop into both eyes 2 (two) times daily as needed (for dry eyes).     Marland Kitchen losartan (COZAAR) 100 MG tablet Take 100 mg by mouth daily.    . Magnesium 200 MG TABS Take 1 tablet (200 mg total) by mouth daily. 30 each   . Multiple Vitamin (  MULTIVITAMIN) capsule Take 1 capsule by mouth daily.      . mupirocin ointment (BACTROBAN) 2 % Apply 1 application topically 2 (two) times daily as needed (for wound care).    . nitroGLYCERIN (NITROSTAT) 0.4 MG SL tablet PLACE ONE TABLET UNDER THE TONGUE EVERY 5 MINUTES AS NEEDED FOR CHEST PAIN 75 tablet 0  . NON FORMULARY CPAP Machine at night    . Omega-3 Fatty Acids (FISH OIL) 1200 MG CAPS Take 1,200 mg by mouth daily.     . Potassium Chloride ER 20 MEQ TBCR Take 1 tablet by mouth once daily 90 tablet 3  . thiamine 100 MG tablet Take 100 mg by mouth daily.    . Vitamins A & D (VITAMIN A & D) ointment Apply 1 application topically as needed for dry skin.    Marland Kitchen XARELTO 20 MG TABS tablet TAKE 1 TABLET BY MOUTH ONCE DAILY WITH SUPPER 90 tablet 1   No current facility-administered medications for this visit.    Allergies  Allergen Reactions  . Bee Venom Anaphylaxis         Review of Systems negative except from HPI and PMH  Physical Exam    BP 124/70   Pulse 60   Ht _0  (1.753 m)   Wt 222 lb (100.7 kg)   SpO2 95%   BMI 32.78 kg/m  Well developed and well nourished in no acute distress HENT normal Neck supple with JVP-flat Clear Device pocket well healed; without hematoma or erythema.  There is no tethering  Regular rate and rhythm, no murmur Abd-soft with active BS No Clubbing cyanosis tr  edema Skin-warm and dry A & Oriented  Grossly normal sensory and motor function  ECG Afib with Vp and CHB  @ 59    Assessment and plan   Complete heart block   Pacemaker implantation-St. Jude now at [facility] (08/09/19)  Orthostatic lightheadedness  Atrial fibrillation-permanent  Euvolemic.    On Anticoagulation;  No bleeding issues   Euvolemic continue current meds  Device is at [facility].Marland Kitchen We have reviewed the benefits and risks of generator replacement.  These include but are not limited to lead fracture and infection.  The patient understands, agrees and is willing to proceed.     Some billing challenges.  Discussed with office manager  We spent more than 50% of our >25 min visit in face to face counseling regarding the above

## 2019-09-23 ENCOUNTER — Telehealth: Payer: Self-pay

## 2019-09-23 NOTE — Telephone Encounter (Signed)
Attempted phone call to pt to notify of schedule change for procedure 09/27/2019.  Pt's procedure time has been moved to 130pm with arrival at 1130am.  Left voicemail message on home phone to contact [facility] and attempted to call mobile phone as well but voicemail has not been set up so unable to leave message on mobile line.

## 2019-09-24 ENCOUNTER — Other Ambulatory Visit (HOSPITAL_COMMUNITY)
Admission: RE | Admit: 2019-09-24 | Discharge: 2019-09-24 | Disposition: A | Discharge status: Home / Self Care | Payer: Medicare HMO | Source: Ambulatory Visit | Attending: Internal Medicine | Admitting: Internal Medicine

## 2019-09-24 ENCOUNTER — Other Ambulatory Visit: Payer: Medicare HMO

## 2019-09-24 ENCOUNTER — Other Ambulatory Visit: Payer: Self-pay

## 2019-09-24 DIAGNOSIS — Z950 Presence of cardiac pacemaker: Secondary | ICD-10-CM | POA: Diagnosis not present

## 2019-09-24 DIAGNOSIS — I442 Atrioventricular block, complete: Secondary | ICD-10-CM

## 2019-09-24 DIAGNOSIS — I4821 Permanent atrial fibrillation: Secondary | ICD-10-CM | POA: Diagnosis not present

## 2019-09-24 DIAGNOSIS — Z01812 Encounter for preprocedural laboratory examination: Secondary | ICD-10-CM | POA: Diagnosis not present

## 2019-09-24 DIAGNOSIS — Z20822 Contact with and (suspected) exposure to covid-19: Secondary | ICD-10-CM | POA: Diagnosis not present

## 2019-09-24 LAB — CBC
Hematocrit: 40.1 % (ref 37.5–51.0)
Hemoglobin: 14.1 g/dL (ref 13.0–17.7)
MCH: 33.6 pg — ABNORMAL HIGH (ref 26.6–33.0)
MCHC: 35.2 g/dL (ref 31.5–35.7)
MCV: 96 fL (ref 79–97)
Platelets: 208 10*3/uL (ref 150–450)
RBC: 4.2 x10E6/uL (ref 4.14–5.80)
RDW: 11.8 % (ref 11.6–15.4)
WBC: 5.7 10*3/uL (ref 3.4–10.8)

## 2019-09-24 LAB — BASIC METABOLIC PANEL
BUN/Creatinine Ratio: 12 (ref 10–24)
BUN: 8 mg/dL (ref 8–27)
CO2: 19 mmol/L — ABNORMAL LOW (ref 20–29)
Calcium: 9.7 mg/dL (ref 8.6–10.2)
Chloride: 93 mmol/L — ABNORMAL LOW (ref 96–106)
Creatinine, Ser: 0.65 mg/dL — ABNORMAL LOW (ref 0.76–1.27)
GFR calc Af Amer: 107 mL/min/{1.73_m2} (ref >59)
GFR calc non Af Amer: 93 mL/min/{1.73_m2} (ref >59)
Glucose: 85 mg/dL (ref 65–99)
Potassium: 4.5 mmol/L (ref 3.5–5.2)
Sodium: 134 mmol/L (ref 134–144)

## 2019-09-24 NOTE — Telephone Encounter (Signed)
Spoke with pt and advised of schedule change for his procedure Friday 09/27/2019.  Pt advised he is now to report to [facility] hospital at 1130am for his 130pm Generator change.  Pt verbalizes understanding and agrees with plan.

## 2019-09-25 LAB — NOVEL CORONAVIRUS, NAA (HOSP ORDER, SEND-OUT TO REF LAB; TAT 18-24 HRS): SARS-CoV-2, NAA: NOT DETECTED

## 2019-09-27 ENCOUNTER — Other Ambulatory Visit: Payer: Self-pay

## 2019-09-27 ENCOUNTER — Ambulatory Visit (HOSPITAL_COMMUNITY)
Admission: RE | Admit: 2019-09-27 | Discharge: 2019-09-27 | Disposition: A | Discharge status: Home / Self Care | Payer: Medicare HMO | Attending: Internal Medicine | Admitting: Internal Medicine

## 2019-09-27 ENCOUNTER — Encounter (HOSPITAL_COMMUNITY)
Admission: RE | Disposition: A | Discharge status: Home / Self Care | Payer: Self-pay | Source: Home / Self Care | Attending: Internal Medicine

## 2019-09-27 DIAGNOSIS — Z79899 Other long term (current) drug therapy: Secondary | ICD-10-CM | POA: Insufficient documentation

## 2019-09-27 DIAGNOSIS — G4730 Sleep apnea, unspecified: Secondary | ICD-10-CM | POA: Diagnosis not present

## 2019-09-27 DIAGNOSIS — I10 Essential (primary) hypertension: Secondary | ICD-10-CM | POA: Insufficient documentation

## 2019-09-27 DIAGNOSIS — Z955 Presence of coronary angioplasty implant and graft: Secondary | ICD-10-CM | POA: Insufficient documentation

## 2019-09-27 DIAGNOSIS — Z7901 Long term (current) use of anticoagulants: Secondary | ICD-10-CM | POA: Insufficient documentation

## 2019-09-27 DIAGNOSIS — I2510 Atherosclerotic heart disease of native coronary artery without angina pectoris: Secondary | ICD-10-CM | POA: Diagnosis not present

## 2019-09-27 DIAGNOSIS — I442 Atrioventricular block, complete: Secondary | ICD-10-CM | POA: Insufficient documentation

## 2019-09-27 DIAGNOSIS — H9190 Unspecified hearing loss, unspecified ear: Secondary | ICD-10-CM | POA: Insufficient documentation

## 2019-09-27 DIAGNOSIS — I4819 Other persistent atrial fibrillation: Secondary | ICD-10-CM | POA: Insufficient documentation

## 2019-09-27 DIAGNOSIS — E785 Hyperlipidemia, unspecified: Secondary | ICD-10-CM | POA: Insufficient documentation

## 2019-09-27 DIAGNOSIS — Z45010 Encounter for checking and testing of cardiac pacemaker pulse generator [battery]: Secondary | ICD-10-CM | POA: Insufficient documentation

## 2019-09-27 HISTORY — PX: PPM GENERATOR CHANGEOUT: EP1233

## 2019-09-27 SURGERY — PPM GENERATOR CHANGEOUT
Anesthesia: LOCAL

## 2019-09-27 MED ORDER — ONDANSETRON HCL 4 MG/2ML IJ SOLN: 4.0000 mg | Freq: Four times a day (QID) | INTRAMUSCULAR | Status: DC | PRN

## 2019-09-27 MED ORDER — SODIUM CHLORIDE 0.9 % IV SOLN
80.0000 mg | INTRAVENOUS | Status: AC
  Administered 2019-09-27: 15:00:00 80 mg

## 2019-09-27 MED ORDER — CEFAZOLIN SODIUM-DEXTROSE 2-4 GM/100ML-% IV SOLN
2.0000 g | INTRAVENOUS | Status: AC
  Administered 2019-09-27: 15:00:00 2 g via INTRAVENOUS

## 2019-09-27 MED ORDER — SODIUM CHLORIDE 0.9 % IV SOLN
INTRAVENOUS | Status: AC
  Filled 2019-09-27: qty 2

## 2019-09-27 MED ORDER — HEPARIN (PORCINE) IN NACL 1000-0.9 UT/500ML-% IV SOLN
INTRAVENOUS | Status: AC
  Filled 2019-09-27: qty 500

## 2019-09-27 MED ORDER — ACETAMINOPHEN 325 MG PO TABS: 325.0000 mg | ORAL_TABLET | ORAL | Status: DC | PRN

## 2019-09-27 MED ORDER — CEFAZOLIN SODIUM-DEXTROSE 2-4 GM/100ML-% IV SOLN
INTRAVENOUS | Status: AC
  Filled 2019-09-27: qty 100

## 2019-09-27 MED ORDER — LIDOCAINE HCL (PF) 1 % IJ SOLN
INTRAMUSCULAR | Status: DC | PRN
  Administered 2019-09-27: 60 mL

## 2019-09-27 MED ORDER — SODIUM CHLORIDE 0.9 % IV SOLN: INTRAVENOUS | Status: DC

## 2019-09-27 MED ORDER — FENTANYL CITRATE (PF) 100 MCG/2ML IJ SOLN
INTRAMUSCULAR | Status: AC
  Filled 2019-09-27: qty 2

## 2019-09-27 MED ORDER — HEPARIN (PORCINE) IN NACL 1000-0.9 UT/500ML-% IV SOLN
INTRAVENOUS | Status: DC | PRN
  Administered 2019-09-27: 500 mL

## 2019-09-27 MED ORDER — MIDAZOLAM HCL 5 MG/5ML IJ SOLN
INTRAMUSCULAR | Status: AC
  Filled 2019-09-27: qty 5

## 2019-09-27 MED ORDER — LIDOCAINE HCL 1 % IJ SOLN
INTRAMUSCULAR | Status: AC
  Filled 2019-09-27: qty 60

## 2019-09-27 SURGICAL SUPPLY — 6 items
CABLE SURGICAL S-101-97-12 (CABLE) ×2 IMPLANT
ELECT DEFIB PAD ADLT CADENCE (PAD) ×1 IMPLANT
HEMOSTAT SURGICEL 2X4 FIBR (HEMOSTASIS) ×1 IMPLANT
PACEMAKER ASSURITY DR-RF (Pacemaker) ×1 IMPLANT
PAD PRO RADIOLUCENT 2001M-C (PAD) ×2 IMPLANT
TRAY PACEMAKER INSERTION (PACKS) ×2 IMPLANT

## 2019-09-27 NOTE — Interval H&P Note (Signed)
History and Physical Interval Note:  09/27/2019 2:24 PM  [name] Bible  has presented today for surgery, with the diagnosis of pacemaker eri.  The various methods of treatment have been discussed with the patient and family. After consideration of risks, benefits and other options for treatment, the patient has consented to  Procedure(s): PPM GENERATOR CHANGEOUT (N/A) as a surgical intervention.  The patient's history has been reviewed, patient examined, no change in status, stable for surgery.  I have reviewed the patient's chart and labs.  Questions were answered to the patient's satisfaction.     Virl Axe

## 2019-09-27 NOTE — Discharge Instructions (Signed)
Post procedure care instructions Keep incision clean and dry for 10 days. No driving for 2 days.  You can remove outer dressing tomorrow. Leave steri-strips (little pieces of tape) on until seen in the office for wound check appointment. Call the office [phone]) for redness, drainage, swelling, or fever.  May resume Xarelto tomorrow night 09/27/19

## 2019-09-27 NOTE — Interval H&P Note (Signed)
History and Physical Interval Note:  09/27/2019 12:52 PM  [name]  has presented today for surgery, with the diagnosis of pacemaker eri.  The various methods of treatment have been discussed with the patient and family. After consideration of risks, benefits and other options for treatment, the patient has consented to  Procedure(s): PPM GENERATOR CHANGEOUT (N/A) as a surgical intervention.  The patient's history has been reviewed, patient examined, no change in status, stable for surgery.  I have reviewed the patient's chart and labs.  Questions were answered to the patient's satisfaction.     Virl Axe

## 2019-10-08 ENCOUNTER — Other Ambulatory Visit: Payer: Self-pay

## 2019-10-08 ENCOUNTER — Ambulatory Visit (INDEPENDENT_AMBULATORY_CARE_PROVIDER_SITE_OTHER): Payer: Medicare HMO | Admitting: *Deleted

## 2019-10-08 DIAGNOSIS — I4821 Permanent atrial fibrillation: Secondary | ICD-10-CM | POA: Diagnosis not present

## 2019-10-08 DIAGNOSIS — Z950 Presence of cardiac pacemaker: Secondary | ICD-10-CM

## 2019-10-08 DIAGNOSIS — I442 Atrioventricular block, complete: Secondary | ICD-10-CM

## 2019-10-08 LAB — CUP PACEART INCLINIC DEVICE CHECK
Battery Remaining Longevity: 109 mo
Battery Voltage: 3.04 V
Brady Statistic RA Percent Paced: 0 %
Brady Statistic RV Percent Paced: 98 %
Date Time Interrogation Session: 20210126113444
Implantable Lead Implant Date: 20111021
Implantable Lead Implant Date: 20111021
Implantable Lead Location: 753859
Implantable Lead Location: 753860
Implantable Pulse Generator Implant Date: 20210115
Lead Channel Impedance Value: 487.5 Ohm
Lead Channel Impedance Value: 487.5 Ohm
Lead Channel Pacing Threshold Amplitude: 1 V
Lead Channel Pacing Threshold Pulse Width: 0.5 ms
Lead Channel Sensing Intrinsic Amplitude: 1 mV
Lead Channel Sensing Intrinsic Amplitude: 12 mV
Lead Channel Setting Pacing Amplitude: 2.5 V
Lead Channel Setting Pacing Amplitude: 2.5 V
Lead Channel Setting Pacing Pulse Width: 0.5 ms
Lead Channel Setting Sensing Sensitivity: 2.5 mV
Pulse Gen Model: 2272
Pulse Gen Serial Number: 3322631

## 2019-10-08 NOTE — Progress Notes (Signed)
Wound check appointment. Steri-strips removed. Wound without redness. Small amount of edema noted around device margins, no hematoma noted, soft to palpitation.  Incision edges approximated, wound well healed. Normal device function. Thresholds, sensing, and impedances consistent with implant measurements. Device programmed at 2.5V, chronic leads. Histogram distribution appropriate for patient and level of activity.   100% AT/AF burden since implant, known persistent AF, on Xarelto, consider reprogramming to VVIR at next f/u. No high ventricular rates noted. 1 noise reversion, date of generator change, occured just after procedure. Educated about wound care, and monitor. Advised he does not have any mobility restrictions. ROV 01/13/20 with Dr. Caryl Comes. Next home remote 12/27/19.

## 2019-11-13 ENCOUNTER — Ambulatory Visit (INDEPENDENT_AMBULATORY_CARE_PROVIDER_SITE_OTHER): Payer: Medicare HMO | Admitting: Otolaryngology

## 2019-11-13 ENCOUNTER — Other Ambulatory Visit: Payer: Self-pay

## 2019-11-13 VITALS — Temp 96.6°F

## 2019-11-13 DIAGNOSIS — H6123 Impacted cerumen, bilateral: Secondary | ICD-10-CM

## 2019-11-13 NOTE — Progress Notes (Signed)
HPI: [name] is a 79 y.o. male who presents for evaluation of cerumen impaction.  He has bilateral hearing aids and is referred by [facility].  The left ear is worse than the right ear..  Past Medical History:  Diagnosis Date  . Atrial flutter ([place])    A. s/p prior ablation;  B.  s/p CTI ablation 10/24/10 (Dr. Caryl Comes)  . CAD (coronary artery disease)    A.  s/p CFX in the past;   B.  cath 06/2010: LAD 30-40%, D1 50%, OM1 50%, AVCFX stent 30%, dCFX 70-80% (med Rx), mRCA 40%;      C.  Echo 06/2010: EF 60-65%, mod LVH; mild LAE     . Complete heart block ([facility])    a. s/p STJ pacemaker  . Cubital tunnel syndrome   . Degenerative disc disease   . Heart block   . HOH (hard of hearing)   . Hx of adenomatous colonic polyps   . Hyperlipidemia   . Hypertension   . Multiple myeloma   . Persistent atrial fibrillation ([place])   . Sleep apnea   . Venous insufficiency    Past Surgical History:  Procedure Laterality Date  . CARDIOVERSION N/A 04/20/2018   Procedure: CARDIOVERSION;  Surgeon: Skeet Latch, MD;  Location: [facility];  Service: Cardiovascular;  Laterality: N/A;  . COLONOSCOPY  10-07-2009   TA polyp, tics, hems   . CORONARY ANGIOPLASTY WITH STENT PLACEMENT  2005  . PACEMAKER PLACEMENT  2011  . POLYPECTOMY    . PPM GENERATOR CHANGEOUT N/A 09/27/2019   Procedure: PPM GENERATOR CHANGEOUT;  Surgeon: Deboraha Sprang, MD;  Location: [facility];  Service: Cardiovascular;  Laterality: N/A;  . Right olecranon nerve surgery    . VENTRAL HERNIA REPAIR     Social History   Socioeconomic History  . Marital status: Married    Spouse name: Not on file  . Number of children: Not on file  . Years of education: Not on file  . Highest education level: Not on file  Occupational History  . Occupation: Retired from Retail banker: RETIRED  . Occupation: still farms part time  Tobacco Use  . Smoking status: Former Smoker    Packs/day: 1.00    Years: 49.00     Pack years: 49.00    Types: Cigarettes    Quit date: 09/12/2004    Years since quitting: 15.1  . Smokeless tobacco: Former Systems developer  . Tobacco comment: started at age 35; smoked 1 ppd; quit in 2006  Substance and Sexual Activity  . Alcohol use: Yes    Alcohol/week: 28.0 standard drinks    Types: 28 Standard drinks or equivalent per week    Comment: occasional  . Drug use: No  . Sexual activity: Not Currently  Other Topics Concern  . Not on file  Social History Narrative  . Not on file   Social Determinants of Health   Financial Resource Strain:   . Difficulty of Paying Living Expenses: Not on file  Food Insecurity:   . Worried About Charity fundraiser in the Last Year: Not on file  . Ran Out of Food in the Last Year: Not on file  Transportation Needs:   . Lack of Transportation (Medical): Not on file  . Lack of Transportation (Non-Medical): Not on file  Physical Activity:   . Days of Exercise per Week: Not on file  . Minutes of Exercise per Session: Not on file  Stress:   . Feeling of Stress : Not on file  Social Connections:   . Frequency of Communication with Friends and Family: Not on file  . Frequency of Social Gatherings with Friends and Family: Not on file  . Attends Religious Services: Not on file  . Active Member of Clubs or Organizations: Not on file  . Attends Archivist Meetings: Not on file  . Marital Status: Not on file   Family History  Problem Relation Age of Onset  . Heart disease Father   . Cancer Father        prostate  . Heart disease Brother   . Colon cancer Neg Hx    Allergies  Allergen Reactions  . Bee Venom Anaphylaxis        Prior to Admission medications   Medication Sig Start Date End Date Taking? Authorizing Provider  atorvastatin (LIPITOR) 10 MG tablet Take 10 mg by mouth daily at 6 PM.    Yes [provider]  EPINEPHrine 0.3 mg/0.3 mL IJ SOAJ injection Inject 0.3 mg into the muscle once as needed for anaphylaxis.  04/25/17  Yes [provider]  finasteride (PROSCAR) 5 MG tablet Take 5 mg by mouth daily.   Yes [provider]  furosemide (LASIX) 40 MG tablet Take 1 tablet by mouth once daily Patient taking differently: Take 40 mg by mouth daily.  08/31/19  Yes Baldwin Jamaica, PA-C  ketotifen (ALAWAY) 0.025 % ophthalmic solution Place 1 drop into both eyes 2 (two) times daily as needed (for dry eyes).    Yes [provider]  losartan (COZAAR) 100 MG tablet Take 100 mg by mouth daily.   Yes [provider]  Magnesium 200 MG TABS Take 1 tablet (200 mg total) by mouth daily. 05/08/18  Yes Sherran Needs, NP  Multiple Vitamin (MULTIVITAMIN) capsule Take 1 capsule by mouth daily.     Yes [provider]  nitroGLYCERIN (NITROSTAT) 0.4 MG SL tablet PLACE ONE TABLET UNDER THE TONGUE EVERY 5 MINUTES AS NEEDED FOR CHEST PAIN Patient taking differently: Place 0.4 mg under the tongue every 5 (five) minutes as needed for chest pain.  07/19/18  Yes Josue Hector, MD  Omega-3 Fatty Acids (FISH OIL) 1200 MG CAPS Take 1,200 mg by mouth daily.    Yes [provider]  Potassium Chloride ER 20 MEQ TBCR Take 1 tablet by mouth once daily 07/04/19  Yes Baldwin Jamaica, PA-C  thiamine 100 MG tablet Take 100 mg by mouth daily.   Yes [provider]  Vitamins A & D (VITAMIN A & D) ointment Apply 1 application topically as needed for dry skin.   Yes [provider]  XARELTO 20 MG TABS tablet TAKE 1 TABLET BY MOUTH ONCE DAILY WITH SUPPER Patient taking differently: Take 20 mg by mouth daily with supper.  08/05/19  Yes Deboraha Sprang, MD     Positive ROS: Otherwise negative  All other systems have been reviewed and were otherwise negative with the exception of those mentioned in the HPI and as above.  Physical Exam: Constitutional: Alert, well-appearing, no acute distress Ears: External ears without lesions or tenderness. Ear canals with impacted cerumen  on the left side and mild inflammatory changes of the left ear.  Right ear canal likewise reveals some impacted cerumen adjacent to the right TM.  The impacted cerumen on the left side was difficult to remove as it was deep within the ear canal impacted against  the TM and had slight inflammatory changes.  After cleaning the wax from the left side I applied CSF powder to the left ear.. Nasal: External nose without lesions. Clear nasal passages Oral: Oropharynx clear. Neck: No palpable adenopathy or masses Respiratory: Breathing comfortably  Skin: No facial/neck lesions or rash noted.  Cerumen impaction removal  Date/Time: 11/13/2019 12:44 PM Performed by: Rozetta Nunnery, MD Authorized by: Rozetta Nunnery, MD   Consent:    Consent obtained:  Verbal   Consent given by:  Patient   Risks discussed:  Pain and bleeding Procedure details:    Location:  L ear and R ear   Procedure type: curette, irrigation, suction and forceps   Post-procedure details:    Inspection:  TM intact and canal normal   Hearing quality:  Improved   Patient tolerance of procedure:  Tolerated well, no immediate complications Comments:     The left ear was difficult cerumen cleaned as the cerumen was impacted with slight inflammatory changes of the medial portion of the ear canal and TM.  I applied CSF powder to the left ear.  The right ear canal was little bit easier to clean as there was not as much inflammatory changes or is much wax impacted.    Assessment: Left ear cerumen impaction with right sided wax buildup. Mild inflammatory changes on the left side  Plan: I prescribed Cortisporin drops to use if he develops any ear pain on the left side. He will follow-up in 1 year for recheck and cleaning.  Earlier if he has any problems.  Radene Journey, MD

## 2019-12-17 DIAGNOSIS — Z125 Encounter for screening for malignant neoplasm of prostate: Secondary | ICD-10-CM | POA: Diagnosis not present

## 2019-12-17 DIAGNOSIS — R7301 Impaired fasting glucose: Secondary | ICD-10-CM | POA: Diagnosis not present

## 2019-12-17 DIAGNOSIS — Z0000 Encounter for general adult medical examination without abnormal findings: Secondary | ICD-10-CM | POA: Diagnosis not present

## 2019-12-17 DIAGNOSIS — E7849 Other hyperlipidemia: Secondary | ICD-10-CM | POA: Diagnosis not present

## 2019-12-24 DIAGNOSIS — Z1212 Encounter for screening for malignant neoplasm of rectum: Secondary | ICD-10-CM | POA: Diagnosis not present

## 2019-12-24 DIAGNOSIS — Z7901 Long term (current) use of anticoagulants: Secondary | ICD-10-CM | POA: Diagnosis not present

## 2019-12-24 DIAGNOSIS — Z950 Presence of cardiac pacemaker: Secondary | ICD-10-CM | POA: Diagnosis not present

## 2019-12-24 DIAGNOSIS — E785 Hyperlipidemia, unspecified: Secondary | ICD-10-CM | POA: Diagnosis not present

## 2019-12-24 DIAGNOSIS — G4730 Sleep apnea, unspecified: Secondary | ICD-10-CM | POA: Diagnosis not present

## 2019-12-24 DIAGNOSIS — R82998 Other abnormal findings in urine: Secondary | ICD-10-CM | POA: Diagnosis not present

## 2019-12-24 DIAGNOSIS — Z1331 Encounter for screening for depression: Secondary | ICD-10-CM | POA: Diagnosis not present

## 2019-12-24 DIAGNOSIS — I25118 Atherosclerotic heart disease of native coronary artery with other forms of angina pectoris: Secondary | ICD-10-CM | POA: Diagnosis not present

## 2019-12-24 DIAGNOSIS — Z0000 Encounter for general adult medical examination without abnormal findings: Secondary | ICD-10-CM | POA: Diagnosis not present

## 2019-12-24 DIAGNOSIS — I10 Essential (primary) hypertension: Secondary | ICD-10-CM | POA: Diagnosis not present

## 2019-12-24 DIAGNOSIS — R7301 Impaired fasting glucose: Secondary | ICD-10-CM | POA: Diagnosis not present

## 2019-12-24 DIAGNOSIS — I4821 Permanent atrial fibrillation: Secondary | ICD-10-CM | POA: Diagnosis not present

## 2019-12-24 DIAGNOSIS — C9000 Multiple myeloma not having achieved remission: Secondary | ICD-10-CM | POA: Diagnosis not present

## 2019-12-24 LAB — IFOBT (OCCULT BLOOD): IFOBT: NEGATIVE

## 2019-12-26 ENCOUNTER — Inpatient Hospital Stay: Payer: Medicare HMO | Attending: Internal Medicine

## 2019-12-26 ENCOUNTER — Other Ambulatory Visit: Payer: Self-pay

## 2019-12-26 DIAGNOSIS — C9000 Multiple myeloma not having achieved remission: Secondary | ICD-10-CM | POA: Diagnosis present

## 2019-12-26 DIAGNOSIS — Z950 Presence of cardiac pacemaker: Secondary | ICD-10-CM | POA: Insufficient documentation

## 2019-12-26 DIAGNOSIS — R0602 Shortness of breath: Secondary | ICD-10-CM | POA: Diagnosis not present

## 2019-12-26 DIAGNOSIS — D472 Monoclonal gammopathy: Secondary | ICD-10-CM

## 2019-12-26 LAB — CBC WITH DIFFERENTIAL (CANCER CENTER ONLY)
Abs Immature Granulocytes: 0.02 10*3/uL (ref 0.00–0.07)
Basophils Absolute: 0.1 10*3/uL (ref 0.0–0.1)
Basophils Relative: 1 %
Eosinophils Absolute: 0.4 10*3/uL (ref 0.0–0.5)
Eosinophils Relative: 7 %
HCT: 41 % (ref 39.0–52.0)
Hemoglobin: 14 g/dL (ref 13.0–17.0)
Immature Granulocytes: 0 %
Lymphocytes Relative: 18 %
Lymphs Abs: 1 10*3/uL (ref 0.7–4.0)
MCH: 33.4 pg (ref 26.0–34.0)
MCHC: 34.1 g/dL (ref 30.0–36.0)
MCV: 97.9 fL (ref 80.0–100.0)
Monocytes Absolute: 0.7 10*3/uL (ref 0.1–1.0)
Monocytes Relative: 13 %
Neutro Abs: 3.3 10*3/uL (ref 1.7–7.7)
Neutrophils Relative %: 61 %
Platelet Count: 189 10*3/uL (ref 150–400)
RBC: 4.19 MIL/uL — ABNORMAL LOW (ref 4.22–5.81)
RDW: 12.3 % (ref 11.5–15.5)
WBC Count: 5.4 10*3/uL (ref 4.0–10.5)
nRBC: 0 % (ref 0.0–0.2)

## 2019-12-26 LAB — CMP (CANCER CENTER ONLY)
ALT: 32 U/L (ref 0–44)
AST: 44 U/L — ABNORMAL HIGH (ref 15–41)
Albumin: 3.6 g/dL (ref 3.5–5.0)
Alkaline Phosphatase: 77 U/L (ref 38–126)
Anion gap: 12 (ref 5–15)
BUN: 8 mg/dL (ref 8–23)
CO2: 24 mmol/L (ref 22–32)
Calcium: 9.5 mg/dL (ref 8.9–10.3)
Chloride: 102 mmol/L (ref 98–111)
Creatinine: 0.72 mg/dL (ref 0.61–1.24)
GFR, Est AFR Am: >60 mL/min (ref >60)
GFR, Estimated: >60 mL/min (ref >60)
Glucose, Bld: 100 mg/dL — ABNORMAL HIGH (ref 70–99)
Potassium: 4 mmol/L (ref 3.5–5.1)
Sodium: 138 mmol/L (ref 135–145)
Total Bilirubin: 1.2 mg/dL (ref 0.3–1.2)
Total Protein: 8.3 g/dL — ABNORMAL HIGH (ref 6.5–8.1)

## 2019-12-26 LAB — LACTATE DEHYDROGENASE: LDH: 176 U/L (ref 98–192)

## 2019-12-27 ENCOUNTER — Ambulatory Visit (INDEPENDENT_AMBULATORY_CARE_PROVIDER_SITE_OTHER): Payer: Medicare HMO | Admitting: *Deleted

## 2019-12-27 DIAGNOSIS — I442 Atrioventricular block, complete: Secondary | ICD-10-CM

## 2019-12-27 LAB — IGG, IGA, IGM
IgA: 114 mg/dL (ref 61–437)
IgG (Immunoglobin G), Serum: 485 mg/dL — ABNORMAL LOW (ref 603–1613)
IgM (Immunoglobulin M), Srm: 3749 mg/dL — ABNORMAL HIGH (ref 15–143)

## 2019-12-27 LAB — CUP PACEART REMOTE DEVICE CHECK
Battery Remaining Longevity: 101 mo
Battery Remaining Percentage: 95.5 %
Battery Voltage: 3.01 V
Brady Statistic AP VP Percent: 0 %
Brady Statistic AP VS Percent: 0 %
Brady Statistic AS VP Percent: 0 %
Brady Statistic AS VS Percent: 0 %
Brady Statistic RA Percent Paced: 1 %
Brady Statistic RV Percent Paced: 97 %
Date Time Interrogation Session: 20210416020012
Implantable Lead Implant Date: 20111021
Implantable Lead Implant Date: 20111021
Implantable Lead Location: 753859
Implantable Lead Location: 753860
Implantable Pulse Generator Implant Date: 20210115
Lead Channel Impedance Value: 450 Ohm
Lead Channel Impedance Value: 460 Ohm
Lead Channel Pacing Threshold Amplitude: 1 V
Lead Channel Pacing Threshold Pulse Width: 0.5 ms
Lead Channel Sensing Intrinsic Amplitude: 1 mV
Lead Channel Sensing Intrinsic Amplitude: 12 mV
Lead Channel Setting Pacing Amplitude: 2.5 V
Lead Channel Setting Pacing Amplitude: 2.5 V
Lead Channel Setting Pacing Pulse Width: 0.5 ms
Lead Channel Setting Sensing Sensitivity: 2.5 mV
Pulse Gen Model: 2272
Pulse Gen Serial Number: 3322631

## 2019-12-27 LAB — BETA 2 MICROGLOBULIN, SERUM: Beta-2 Microglobulin: 1.6 mg/L (ref 0.6–2.4)

## 2019-12-27 LAB — KAPPA/LAMBDA LIGHT CHAINS
Kappa free light chain: 16.8 mg/L (ref 3.3–19.4)
Kappa, lambda light chain ratio: 0.18 — ABNORMAL LOW (ref 0.26–1.65)
Lambda free light chains: 94.2 mg/L — ABNORMAL HIGH (ref 5.7–26.3)

## 2019-12-27 NOTE — Progress Notes (Signed)
PPM Remote  

## 2020-01-02 ENCOUNTER — Telehealth: Payer: Self-pay | Admitting: Medical Oncology

## 2020-01-02 ENCOUNTER — Other Ambulatory Visit: Payer: Self-pay | Admitting: Medical Oncology

## 2020-01-02 ENCOUNTER — Other Ambulatory Visit: Payer: Self-pay

## 2020-01-02 ENCOUNTER — Inpatient Hospital Stay: Payer: Medicare HMO | Admitting: Internal Medicine

## 2020-01-02 VITALS — BP 135/72 | HR 72 | Temp 97.8°F | Resp 18 | Ht 71.0 in | Wt 229.6 lb

## 2020-01-02 DIAGNOSIS — I10 Essential (primary) hypertension: Secondary | ICD-10-CM

## 2020-01-02 DIAGNOSIS — Z5111 Encounter for antineoplastic chemotherapy: Secondary | ICD-10-CM

## 2020-01-02 DIAGNOSIS — C9000 Multiple myeloma not having achieved remission: Secondary | ICD-10-CM

## 2020-01-02 DIAGNOSIS — Z7189 Other specified counseling: Secondary | ICD-10-CM

## 2020-01-02 DIAGNOSIS — R0602 Shortness of breath: Secondary | ICD-10-CM | POA: Diagnosis not present

## 2020-01-02 DIAGNOSIS — Z950 Presence of cardiac pacemaker: Secondary | ICD-10-CM | POA: Diagnosis not present

## 2020-01-02 MED ORDER — LENALIDOMIDE 25 MG PO CAPS: 25.0000 mg | ORAL_CAPSULE | Freq: Every day | ORAL | 0 refills | Status: DC

## 2020-01-02 MED ORDER — DEXAMETHASONE 4 MG PO TABS: ORAL_TABLET | ORAL | 3 refills | Status: DC

## 2020-01-02 MED ORDER — ACYCLOVIR 200 MG PO CAPS: 200.0000 mg | ORAL_CAPSULE | Freq: Two times a day (BID) | ORAL | 2 refills | Status: AC

## 2020-01-02 MED ORDER — PROCHLORPERAZINE MALEATE 10 MG PO TABS: 10.0000 mg | ORAL_TABLET | Freq: Four times a day (QID) | ORAL | 0 refills | Status: AC | PRN

## 2020-01-02 NOTE — Progress Notes (Signed)
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 daily for 14 days every 3 weeks in addition to weekly Decadron 40 mg orally.  INTERVAL HISTORY: [name] 79 y.o. male returns to the clinic today for follow-up visit.  The patient is feeling fine today with no concerning complaints except for fatigue.  He had a pacemaker placed in January 2021.  He denied having any current chest pain but has shortness of breath with exertion with no cough or hemoptysis.  He denied having any fever or chills.  He has no nausea, vomiting, diarrhea or constipation.  He denied having any headache or visual changes.  The patient is here today for evaluation with repeat myeloma panel.  MEDICAL HISTORY: Past Medical History:  Diagnosis Date  . Atrial flutter ([place])    A. s/p prior ablation;  B.  s/p CTI ablation 10/24/10 (Dr. Caryl Comes)  . CAD (coronary artery disease)    A.  s/p CFX in the past;   B.  cath 06/2010: LAD 30-40%, D1 50%, OM1 50%, AVCFX stent 30%, dCFX 70-80% (med Rx), mRCA 40%;      C.  Echo 06/2010: EF 60-65%, mod LVH; mild LAE     . Complete heart block ([facility])    a. s/p STJ pacemaker  . Cubital tunnel syndrome   . Degenerative disc disease   . Heart block   . HOH (hard of hearing)   . Hx of adenomatous colonic polyps   . Hyperlipidemia   . Hypertension   . Multiple myeloma   . Persistent atrial fibrillation ([place])   . Sleep apnea   . Venous insufficiency     ALLERGIES:  is allergic to bee venom.  MEDICATIONS:  Current Outpatient Medications  Medication Sig Dispense Refill  . atorvastatin (LIPITOR) 10 MG tablet Take 10 mg by mouth daily at 6 PM.     . EPINEPHrine 0.3 mg/0.3 mL IJ SOAJ  injection Inject 0.3 mg into the muscle once as needed for anaphylaxis.    . finasteride (PROSCAR) 5 MG tablet Take 5 mg by mouth daily.    . furosemide (LASIX) 40 MG tablet Take 1 tablet by mouth once daily (Patient taking differently: Take 40 mg by mouth daily. ) 30 tablet 6  . ketotifen (ALAWAY) 0.025 % ophthalmic solution Place 1 drop into both eyes 2 (two) times daily as needed (for dry eyes).     Marland Kitchen losartan (COZAAR) 100 MG tablet Take 100 mg by mouth daily.    . Magnesium 200 MG TABS Take 1 tablet (200 mg total) by mouth daily. 30 each   . Multiple Vitamin (MULTIVITAMIN) capsule Take 1 capsule by mouth daily.      . nitroGLYCERIN (NITROSTAT) 0.4 MG SL tablet PLACE ONE TABLET UNDER THE TONGUE EVERY 5 MINUTES AS NEEDED FOR CHEST PAIN (Patient taking differently: Place 0.4 mg under the tongue every 5 (five) minutes as needed for chest pain. ) 75 tablet 0  . Omega-3 Fatty Acids (FISH OIL) 1200 MG CAPS Take 1,200 mg by mouth daily.     . Potassium Chloride ER 20 MEQ TBCR Take 1 tablet by mouth once daily 90 tablet 3  . thiamine 100 MG tablet Take 100 mg by  mouth daily.    . Vitamins A & D (VITAMIN A & D) ointment Apply 1 application topically as needed for dry skin.    Marland Kitchen XARELTO 20 MG TABS tablet TAKE 1 TABLET BY MOUTH ONCE DAILY WITH SUPPER (Patient taking differently: Take 20 mg by mouth daily with supper. ) 90 tablet 1   No current facility-administered medications for this visit.    SURGICAL HISTORY:  Past Surgical History:  Procedure Laterality Date  . CARDIOVERSION N/A 04/20/2018   Procedure: CARDIOVERSION;  Surgeon: Skeet Latch, MD;  Location: [facility];  Service: Cardiovascular;  Laterality: N/A;  . COLONOSCOPY  10-07-2009   TA polyp, tics, hems   . CORONARY ANGIOPLASTY WITH STENT PLACEMENT  2005  . PACEMAKER PLACEMENT  2011  . POLYPECTOMY    . PPM GENERATOR CHANGEOUT N/A 09/27/2019   Procedure: PPM GENERATOR CHANGEOUT;  Surgeon: Deboraha Sprang, MD;  Location: [facility];  Service: Cardiovascular;  Laterality: N/A;  . Right olecranon nerve surgery    . VENTRAL HERNIA REPAIR      REVIEW OF SYSTEMS:  Constitutional: positive for fatigue Eyes: negative Ears, nose, mouth, throat, and face: negative Respiratory: positive for dyspnea on exertion Cardiovascular: negative Gastrointestinal: negative Genitourinary:negative Integument/breast: negative Hematologic/lymphatic: negative Musculoskeletal:negative Neurological: negative Behavioral/Psych: negative Endocrine: negative Allergic/Immunologic: negative   PHYSICAL EXAMINATION: General appearance: alert, cooperative, fatigued and no distress Head: Normocephalic, without obvious abnormality, atraumatic Neck: no adenopathy, no JVD, supple, symmetrical, trachea midline and thyroid not enlarged, symmetric, no tenderness/mass/nodules Lymph nodes: Cervical, supraclavicular, and axillary nodes normal. Resp: clear to auscultation bilaterally Back: symmetric, no curvature. ROM normal. No CVA tenderness. Cardio: regular rate and rhythm, S1, S2 normal, no murmur, click, rub or gallop GI: soft, non-tender; bowel sounds normal; no masses,  no organomegaly Extremities: extremities normal, atraumatic, no cyanosis or edema Neurologic: Alert and oriented X 3, normal strength and tone. Normal symmetric reflexes. Normal coordination and gait  ECOG PERFORMANCE STATUS: 1 - Symptomatic but completely ambulatory  Blood pressure 135/72, pulse 72, temperature 97.8 F (36.6 C), temperature source Temporal, resp. rate 18, height 5' 11"  (1.803 m), weight 229 lb 9.6 oz (104.1 kg), SpO2 99 %.  LABORATORY DATA: Lab Results  Component Value Date   WBC 5.4 12/26/2019   HGB 14.0 12/26/2019   HCT 41.0 12/26/2019   MCV 97.9 12/26/2019   PLT 189 12/26/2019      Chemistry      Component Value Date/Time   NA 138 12/26/2019 0754   NA 134 09/24/2019 0904   NA 136 06/06/2017 1058   K 4.0 12/26/2019 0754   K 4.0 06/06/2017  1058   CL 102 12/26/2019 0754   CL 97 (L) 12/03/2012 1015   CO2 24 12/26/2019 0754   CO2 24 06/06/2017 1058   BUN 8 12/26/2019 0754   BUN 8 09/24/2019 0904   BUN 5.6 (L) 06/06/2017 1058   CREATININE 0.72 12/26/2019 0754   CREATININE 0.8 06/06/2017 1058      Component Value Date/Time   CALCIUM 9.5 12/26/2019 0754   CALCIUM 9.1 06/06/2017 1058   ALKPHOS 77 12/26/2019 0754   ALKPHOS 61 06/06/2017 1058   AST 44 (H) 12/26/2019 0754   AST 36 (H) 06/06/2017 1058   ALT 32 12/26/2019 0754   ALT 23 06/06/2017 1058   BILITOT 1.2 12/26/2019 0754   BILITOT 0.46 06/06/2017 1058       RADIOGRAPHIC STUDIES: CUP PACEART REMOTE DEVICE CHECK  Result Date: 12/27/2019 Scheduled remote reviewed. Normal  device function.  100% AF burden. Takes Xarelto. Next remote 91 days. Felisa Bonier, RN, MSN   ASSESSMENT AND PLAN:  This is a very pleasant 79 years old white male with history of smoldering multiple myeloma diagnosed in August 2011 and has been observation since that time. The patient is doing fine today with no concerning complaints except for increasing fatigue and weakness. He had repeat myeloma panel performed recently.  I discussed the results with the patient today. His myeloma panel showed continued increase in IgM as well as free lambda light chain. I had a lengthy discussion with the patient today about his current condition and treatment options.  I gave him the option of continuous observation and monitoring versus treatment with systemic chemotherapy with subcutaneous Velcade 1.3 mg/M2 weekly in addition to Revlimid 25 mg p.o. for 14 days every 3 weeks and weekly Decadron 40 mg orally.  The patient is interested in proceeding with the treatment. He is expected to start the first cycle of this treatment in around 2 weeks. I discussed with him the adverse effect of this treatment including but not limited to alopecia, myelosuppression, nausea and vomiting, peripheral neuropathy, liver or renal  dysfunction. The patient will also start treatment with acyclovir 200 mg p.o. twice daily during his treatment with Velcade. He is currently on blood thinner and he will not need any Coumadin during his treatment with Revlimid. The patient will have a chemotherapy education class before the first dose of his treatment. He will come back for follow-up visit with the first day of his treatment. He was advised to call immediately if he has any concerning symptoms in the interval. The patient voices understanding of current disease status and treatment options and is in agreement with the current care plan.  All questions were answered. The patient knows to call the clinic with any problems, questions or concerns. We can certainly see the patient much sooner if necessary.   Disclaimer: This note was dictated with voice recognition software. Similar sounding words can inadvertently be transcribed and may not be corrected upon review.

## 2020-01-02 NOTE — Progress Notes (Signed)
START ON PATHWAY REGIMEN - Multiple Myeloma and Other Plasma Cell Dyscrasias     A cycle is every 21 days:     Bortezomib      Lenalidomide      Dexamethasone   **Always confirm dose/schedule in your pharmacy ordering system**  Patient Characteristics: Multiple Myeloma, Newly Diagnosed, Transplant Ineligible or Refused, Standard Risk Disease Classification: Multiple Myeloma R-ISS Staging: II Therapeutic Status: Newly Diagnosed Is Patient Eligible for Transplant<= Transplant Ineligible or Refused Risk Status: Standard Risk Intent of Therapy: Non-Curative / Palliative Intent, Discussed with Patient

## 2020-01-02 NOTE — Telephone Encounter (Signed)
Faxed new pt enrollment contract to [facility]. Prescription written ,but needs auth Number and date then fax to [facility]. May take 24 hours to get pt enrolled.

## 2020-01-02 NOTE — Patient Instructions (Signed)
Lenalidomide Oral Capsules What is this medicine? LENALIDOMIDE (len a LID oh mide) is a chemotherapy drug that targets specific proteins within cancer cells and stops the cancer cell from growing. It is used to treat multiple myeloma, certain types of lymphoma, and some myelodysplastic syndromes that cause severe anemia requiring blood transfusions. This medicine may be used for other purposes; ask your health care provider or pharmacist if you have questions. COMMON BRAND NAME(S): Revlimid What should I tell my health care provider before I take this medicine? They need to know if you have any of these conditions:  blood clots in the legs or the lungs  high blood pressure  high cholesterol  infection  irregular monthly periods or menstrual cycles  kidney disease  liver disease  smoke tobacco  thyroid disease  an unusual or allergic reaction to lenalidomide, thalidomide, other medicines, foods, dyes, or preservatives  pregnant or trying to get pregnant  breast-feeding How should I use this medicine? Take this medicine by mouth with a glass of water. Follow the directions on the prescription label. Do not cut, crush, or chew this medicine. Take your medicine at regular intervals. Do not take it more often than directed. Do not stop taking except on your doctor's advice. A MedGuide will be given with each prescription and refill. Read this guide carefully each time. The MedGuide may change frequently. Talk to your pediatrician regarding the use of this medicine in children. Special care may be needed. Overdosage: If you think you have taken too much of this medicine contact a poison control center or emergency room at once. NOTE: This medicine is only for you. Do not share this medicine with others. What if I miss a dose? If you miss a dose, take it as soon as you can. If your next dose is to be taken in less than 12 hours, then do not take the missed dose. Take the next dose at  your regular time. Do not take double or extra doses. What may interact with this medicine? This medicine may interact with the following medications:  digoxin  medicines that increase the risk of thrombosis like estrogens or erythropoietic agents (e.g., epoetin alfa and darbepoetin alfa)  warfarin This list may not describe all possible interactions. Give your health care provider a list of all the medicines, herbs, non-prescription drugs, or dietary supplements you use. Also tell them if you smoke, drink alcohol, or use illegal drugs. Some items may interact with your medicine. What should I watch for while using this medicine? You may need blood work done while you are taking this medicine. This medicine may cause serious skin reactions. They can happen weeks to months after starting the medicine. Contact your health care provider right away if you notice fevers or flu-like symptoms with a rash. The rash may be red or purple and then turn into blisters or peeling of the skin. Or, you might notice a red rash with swelling of the face, lips or lymph nodes in your neck or under your arms. This medicine is available only through a special program. Doctors, pharmacies, and patients must meet all of the conditions of the program. Your health care provider will help you get signed up with the program if you need this medicine. Through the program you will only receive up to a 28 day supply of the medicine at one time. You will need a new prescription for each refill. This medicine can cause birth defects. Do not get pregnant while   taking this drug. Females with child-bearing potential will need to have 2 negative pregnancy tests before starting this medicine. Pregnancy testing must be done every 2 to 4 weeks as directed while taking this medicine. Use 2 reliable forms of birth control together while you are taking this medicine and for 4 weeks after you stop taking this medicine. If you think that you  might be pregnant talk to your doctor right away. Do not breast-feed an infant while taking this medicine. Men must use a latex condom during sexual contact with a woman while taking this medicine and for 4 weeks after you stop taking this medicine. A latex condom is needed even if you have had a vasectomy. Contact your doctor right away if your partner becomes pregnant. Do not donate sperm while taking this medicine and for 4 weeks after you stop taking this medicine. Do not give blood while taking the medicine and for 4 weeks after completion of treatment to avoid exposing pregnant women to the medicine through the donated blood. Talk to your doctor about your risk of cancer. You may be more at risk for certain types of cancers if you take this medicine. What side effects may I notice from receiving this medicine? Side effects that you should report to your doctor or health care professional as soon as possible:  allergic reactions like skin rash, itching or hives, swelling of the face, lips, or tongue  breathing problems  chest pain or tightness  fast, irregular heartbeat  feeling faint  low blood counts - this medicine may decrease the number of white blood cells, red blood cells and platelets. You may be at increased risk for infections and bleeding.  rash, fever, and swollen lymph nodes  redness, blistering, peeling or loosening of the skin, including inside the mouth  seizures  signs and symptoms of bleeding such as bloody or black, tarry stools; red or dark-brown urine; spitting up blood or brown material that looks like coffee grounds; red spots on the skin; unusual bruising or bleeding from the eye, gums, or nose  signs and symptoms of a blood clot such as breathing problems; changes in vision; chest pain; severe, sudden headache; pain, swelling, warmth in the leg; trouble speaking; sudden numbness or weakness of the face, arm or leg  signs and symptoms of liver injury like  dark yellow or brown urine; general ill feeling or flu-like symptoms; light-colored stools; loss of appetite; nausea; right upper belly pain; unusually weak or tired; yellowing of the eyes or skin  signs and symptoms of a stroke like changes in vision; confusion; trouble speaking or understanding; severe headaches; sudden numbness or weakness of the face, arm or leg; trouble walking; dizziness; loss of balance or coordination  sweating  vomiting Side effects that usually do not require medical attention (report to your doctor or health care professional if they continue or are bothersome):  constipation  cough  diarrhea  joint pain  muscle cramps  swelling of the arms, legs, or skin  tiredness  trouble sleeping This list may not describe all possible side effects. Call your doctor for medical advice about side effects. You may report side effects to FDA at 1-800-FDA-1088. Where should I keep my medicine? Keep out of the reach of children. Store at room temperature between 15 and 30 degrees C (59 and 86 degrees F). Throw away any unused medicine after the expiration date. NOTE: This sheet is a summary. It may not cover all possible information. If you  have questions about this medicine, talk to your doctor, pharmacist, or health care provider.  2020 Elsevier/Gold Standard (2018-11-30 15:09:17)

## 2020-01-02 NOTE — Progress Notes (Signed)
Revlimid faxed to [employer]

## 2020-01-03 ENCOUNTER — Other Ambulatory Visit: Payer: Self-pay | Admitting: *Deleted

## 2020-01-03 ENCOUNTER — Telehealth: Payer: Self-pay

## 2020-01-03 ENCOUNTER — Encounter: Payer: Self-pay | Admitting: Internal Medicine

## 2020-01-03 ENCOUNTER — Telehealth: Payer: Self-pay | Admitting: Pharmacist

## 2020-01-03 DIAGNOSIS — Z7189 Other specified counseling: Secondary | ICD-10-CM | POA: Insufficient documentation

## 2020-01-03 DIAGNOSIS — C9000 Multiple myeloma not having achieved remission: Secondary | ICD-10-CM

## 2020-01-03 DIAGNOSIS — Z5111 Encounter for antineoplastic chemotherapy: Secondary | ICD-10-CM | POA: Insufficient documentation

## 2020-01-03 NOTE — Telephone Encounter (Signed)
Oral Oncology Patient Advocate Encounter  Prior Authorization for Revlimid has been approved.    PA# [id number] Effective dates: 09/13/19 through 09/11/20   Oral Oncology Clinic will continue to follow.   [place] Patient [place] Phone [phone] Fax [phone] 01/03/2020 12:30 PM

## 2020-01-03 NOTE — Telephone Encounter (Signed)
Oral Oncology Patient Advocate Encounter  Received notification from [facility] that prior authorization for Revlimid is required.  PA submitted on CoverMyMeds Key [id number] Status is pending  Oral Oncology Clinic will continue to follow.  [place] Patient [name] Phone [phone] Fax [phone] 01/03/2020 12:21 PM

## 2020-01-03 NOTE — Telephone Encounter (Signed)
Oral Oncology Pharmacist Encounter  Received new prescription for Revlimid (lenalidomide) for the treatment of multiple myeloma in conjunction with Velcade and dexamethasone, planned duration until disease progression or unacceptable drug toxicity.  CMP from 12/26/19 assessed, no relevant lab abnormalities. Prescription dose and frequency assessed.   Current medication list in Epic reviewed, no relevant DDIs with lenalidomide identified.  Prescription has been e-scribed [employer] for benefits analysis and approval.  Oral Oncology Clinic will continue to follow for insurance authorization, copayment issues, initial counseling and start date.  Darl Pikes, PharmD, BCPS, BCOP, CPP Hematology/Oncology Clinical Pharmacist [facility]/AP [facility] [phone]  01/03/2020 3:49 PM

## 2020-01-07 ENCOUNTER — Other Ambulatory Visit: Payer: Self-pay

## 2020-01-07 ENCOUNTER — Telehealth: Payer: Self-pay | Admitting: Medical Oncology

## 2020-01-07 ENCOUNTER — Inpatient Hospital Stay: Payer: Medicare HMO

## 2020-01-07 ENCOUNTER — Encounter: Payer: Self-pay | Admitting: Internal Medicine

## 2020-01-07 MED ORDER — LENALIDOMIDE 25 MG PO CAPS: 25.0000 mg | ORAL_CAPSULE | Freq: Every day | ORAL | 0 refills | Status: DC

## 2020-01-07 NOTE — Telephone Encounter (Signed)
Revlimid copay is $2448.17. [employer] is reaching out to pt for copay assistance.

## 2020-01-07 NOTE — Progress Notes (Signed)
Met with patient at registration to introduce myself as Arboriculturist and to offer available resources.  Discussed one-time $1000 [employer] assistant to assist with personal expenses while going through treatment.  Gave him my card if interested in applying and for any additional financial questions or concerns.

## 2020-01-08 ENCOUNTER — Encounter: Payer: Self-pay | Admitting: Internal Medicine

## 2020-01-08 NOTE — Progress Notes (Signed)
Pharmacist Chemotherapy Monitoring - Initial Assessment    Anticipated start date: 01/14/20  Regimen:  . Are orders appropriate based on the patient's diagnosis, regimen, and cycle? Yes . Does the plan date match the patient's scheduled date? Yes . Is the sequencing of drugs appropriate? Yes . Are the premedications appropriate for the patient's regimen? Yes . Prior Authorization for treatment is: Approved o If applicable, is the correct biosimilar selected based on the patient's insurance? not applicable  Organ Function and Labs: Marland Kitchen Are dose adjustments needed based on the patient's renal function, hepatic function, or hematologic function? Yes . Are appropriate labs ordered prior to the start of patient's treatment? Yes . Other organ system assessment, if indicated: N/A . The following baseline labs, if indicated, have been ordered: N/A  Dose Assessment: . Are the drug doses appropriate? Yes . Are the following correct: o Drug concentrations Yes o IV fluid compatible with drug Yes o Administration routes Yes o Timing of therapy Yes . If applicable, does the patient have documented access for treatment and/or plans for port-a-cath placement? not applicable . If applicable, have lifetime cumulative doses been properly documented and assessed? not applicable Lifetime Dose Tracking  No doses have been documented on this patient for the following tracked chemicals: Doxorubicin, Epirubicin, Idarubicin, Daunorubicin, Mitoxantrone, Bleomycin, Oxaliplatin, Carboplatin, Liposomal Doxorubicin  o   Toxicity Monitoring/Prevention: . The patient has the following take home antiemetics prescribed: Prochlorperazine . The patient has the following take home medications prescribed: N/A . Medication allergies and previous infusion related reactions, if applicable, have been reviewed and addressed. Yes . The patient's current medication list has been assessed for drug-drug interactions with their  chemotherapy regimen. no significant drug-drug interactions were identified on review.  Order Review: . Are the treatment plan orders signed? Yes . Is the patient scheduled to see a provider prior to their treatment? No  I verify that I have reviewed each item in the above checklist and answered each question accordingly.  Hicks,Zaylin Pistilli D 01/08/2020 2:46 PM

## 2020-01-08 NOTE — Progress Notes (Signed)
Received voicemail from patient regarding grant.  Called patient to discuss what is needed. Patient verbalized understanding.  He has my contact information for any additional financial questions or concerns.

## 2020-01-13 ENCOUNTER — Telehealth: Payer: Self-pay

## 2020-01-13 ENCOUNTER — Ambulatory Visit: Payer: Medicare HMO | Admitting: Internal Medicine

## 2020-01-13 ENCOUNTER — Telehealth: Payer: Self-pay | Admitting: Medical Oncology

## 2020-01-13 ENCOUNTER — Other Ambulatory Visit: Payer: Self-pay | Admitting: Medical Oncology

## 2020-01-13 ENCOUNTER — Other Ambulatory Visit: Payer: Self-pay

## 2020-01-13 VITALS — BP 130/70 | HR 70 | Ht 71.0 in | Wt 226.0 lb

## 2020-01-13 DIAGNOSIS — I442 Atrioventricular block, complete: Secondary | ICD-10-CM | POA: Diagnosis not present

## 2020-01-13 DIAGNOSIS — Z950 Presence of cardiac pacemaker: Secondary | ICD-10-CM | POA: Diagnosis not present

## 2020-01-13 DIAGNOSIS — I4821 Permanent atrial fibrillation: Secondary | ICD-10-CM

## 2020-01-13 DIAGNOSIS — C9000 Multiple myeloma not having achieved remission: Secondary | ICD-10-CM

## 2020-01-13 NOTE — Progress Notes (Signed)
Patient Care Team: [name], MD as PCP - General (Internal Medicine) Murinson, Haynes Bast, MD (Inactive) (Hematology and Oncology)   HPI  [name] is a 79 y.o. male seen in followup for a history of complete heart block status St Jude post pacemaker 2011; underwent device generator replacement   He has a history of prior catheter ablation for atrial flutter. Hx coronary artery disease with prior stenting.   He has subsequently developed atrial fibrillation was hospitalized 06/08/2018 with the introduction of dofetilide.  He converted spontaneously when seen in the A. fib clinic and reverted to atrial fibrillation.  It was the impression of of A.S.-NP that he was unable to tell the difference between sinus and fibrillation and would have a high threshold for pursuing a strategy of rhythm control.    Mild chronic shortness of breath.  No chest pain or edema.  No bleeding.  Interval decision to begin chemotherapy for multiple myeloma.  DATE TEST EF   1/17 MYOVIEW   65 % No ischemia  9/19 Echo   65 %         Date Cr Hgb  10/19 0.72 13.6  10/20  0.73 13.9    Past Medical History:  Diagnosis Date  . Atrial flutter ([place])    A. s/p prior ablation;  B.  s/p CTI ablation 10/24/10 (Dr. Caryl Comes)  . CAD (coronary artery disease)    A.  s/p CFX in the past;   B.  cath 06/2010: LAD 30-40%, D1 50%, OM1 50%, AVCFX stent 30%, dCFX 70-80% (med Rx), mRCA 40%;      C.  Echo 06/2010: EF 60-65%, mod LVH; mild LAE     . Complete heart block ([facility])    a. s/p STJ pacemaker  . Cubital tunnel syndrome   . Degenerative disc disease   . Heart block   . HOH (hard of hearing)   . Hx of adenomatous colonic polyps   . Hyperlipidemia   . Hypertension   . Multiple myeloma   . Persistent atrial fibrillation ([place])   . Sleep apnea   . Venous insufficiency     Past Surgical History:  Procedure Laterality Date  . CARDIOVERSION N/A 04/20/2018   Procedure: CARDIOVERSION;  Surgeon: Skeet Latch, MD;  Location: [facility];  Service: Cardiovascular;  Laterality: N/A;  . COLONOSCOPY  10-07-2009   TA polyp, tics, hems   . CORONARY ANGIOPLASTY WITH STENT PLACEMENT  2005  . PACEMAKER PLACEMENT  2011  . POLYPECTOMY    . PPM GENERATOR CHANGEOUT N/A 09/27/2019   Procedure: PPM GENERATOR CHANGEOUT;  Surgeon: Deboraha Sprang, MD;  Location: [facility];  Service: Cardiovascular;  Laterality: N/A;  . Right olecranon nerve surgery    . VENTRAL HERNIA REPAIR      Current Outpatient Medications  Medication Sig Dispense Refill  . acyclovir (ZOVIRAX) 200 MG capsule Take 1 capsule (200 mg total) by mouth 2 (two) times daily. 60 capsule 2  . atorvastatin (LIPITOR) 10 MG tablet Take 10 mg by mouth daily at 6 PM.     . dexamethasone (DECADRON) 4 MG tablet 10 tablet p.o. daily weekly start with the first day of the chemotherapy. 40 tablet 3  . EPINEPHrine 0.3 mg/0.3 mL IJ SOAJ injection Inject 0.3 mg into the muscle once as needed for anaphylaxis.    . finasteride (PROSCAR) 5 MG tablet Take 5 mg by mouth daily.    . furosemide (LASIX) 40 MG tablet  Take 1 tablet by mouth once daily (Patient taking differently: Take 40 mg by mouth daily. ) 30 tablet 6  . ketotifen (ALAWAY) 0.025 % ophthalmic solution Place 1 drop into both eyes 2 (two) times daily as needed (for dry eyes).     Marland Kitchen lenalidomide (REVLIMID) 25 MG capsule Take 1 capsule (25 mg total) by mouth daily. Take for 14 days, then hold for 7 days. Repeat every 21 days. 14 capsule 0  . losartan (COZAAR) 100 MG tablet Take 100 mg by mouth daily.    . Magnesium 200 MG TABS Take 1 tablet (200 mg total) by mouth daily. 30 each   . Multiple Vitamin (MULTIVITAMIN) capsule Take 1 capsule by mouth daily.      . nitroGLYCERIN (NITROSTAT) 0.4 MG SL tablet PLACE ONE TABLET UNDER THE TONGUE EVERY 5 MINUTES AS NEEDED FOR CHEST PAIN (Patient taking differently: Place 0.4 mg under the tongue every 5 (five) minutes as needed for chest pain. ) 75 tablet 0    . Omega-3 Fatty Acids (FISH OIL) 1200 MG CAPS Take 1,200 mg by mouth daily.     . Potassium Chloride ER 20 MEQ TBCR Take 1 tablet by mouth once daily 90 tablet 3  . prochlorperazine (COMPAZINE) 10 MG tablet Take 1 tablet (10 mg total) by mouth every 6 (six) hours as needed for nausea or vomiting. 30 tablet 0  . thiamine 100 MG tablet Take 100 mg by mouth daily.    . Vitamins A & D (VITAMIN A & D) ointment Apply 1 application topically as needed for dry skin.    Marland Kitchen XARELTO 20 MG TABS tablet TAKE 1 TABLET BY MOUTH ONCE DAILY WITH SUPPER (Patient taking differently: Take 20 mg by mouth daily with supper. ) 90 tablet 1   No current facility-administered medications for this visit.    Allergies  Allergen Reactions  . Bee Venom Anaphylaxis         Review of Systems negative except from HPI and PMH  Physical Exam   BP 130/70   Pulse 70   Ht 5' 11"  (1.803 m)   Wt 226 lb (102.5 kg)   SpO2 96%   BMI 31.52 kg/m  Well developed and well nourished in no acute distress HENT normal Neck supple with JVP-flat Clear Device pocket well healed; without hematoma or erythema.  There is no tethering  Regular rate and rhythm, no  murmur Abd-soft with active BS No Clubbing cyanosis  edema Skin-warm and dry A & Oriented  Grossly normal sensory and motor function  ECG afib and Vpacing    Assessment and plan   Complete heart block   Pacemaker implantation-St. Jude   Orthostatic lightheadedness  Atrial fibrillation-permanent  Multiple myeloma begin chemotherapy     Euvolemic.    No bleeding with his atrial fibrillation.  Lightheadedness is relatively stable.  Euvolemic.  Beginning multiple myeloma chemotherapy     PWP

## 2020-01-13 NOTE — Patient Instructions (Signed)
Medication Instructions:  Your physician recommends that you continue on your current medications as directed. Please refer to the Current Medication list given to you today.  Labwork: None ordered.  Testing/Procedures: None ordered.  Follow-Up: Your physician wants you to follow-up in: 9 months with Dr Klein You will receive a reminder letter in the mail two months in advance. If you don't receive a letter, please call our office to schedule the follow-up appointment.  Remote monitoring is used to monitor your Pacemaker of ICD from home. This monitoring reduces the number of office visits required to check your device to one time per year. It allows us to keep an eye on the functioning of your device to ensure it is working properly.  Any Other Special Instructions Will Be Listed Below (If Applicable).  If you need a refill on your cardiac medications before your next appointment, please call your pharmacy.   

## 2020-01-13 NOTE — Telephone Encounter (Signed)
Oral Oncology Patient Advocate Encounter  Was successful in securing patient a $11000 grant from [facility] to provide copayment coverage for Revlimid.  This will keep the out of pocket expense at $0.     [employer] ID: [id number]  I have spoken with the patient.   The billing information is as follows and has been shared with [employer].    RxBin: [id number] PCN: [id number] Member ID: [id number] Group ID: [id number] Dates of Eligibility: 12/14/19 through 12/12/20  Fund:  [place] Patient [place] Phone [phone] Fax [phone] 01/13/2020 2:00 PM

## 2020-01-13 NOTE — Telephone Encounter (Signed)
He has not received Revlimid.   I told him to keep appts tomorrow.I told pt that [employer] will call him to schedule delivery and he can call them to set up delivery.

## 2020-01-13 NOTE — Telephone Encounter (Signed)
Revlimid delivery scheduled for tomorrow and will be shipped to [facility].

## 2020-01-14 ENCOUNTER — Encounter: Payer: Self-pay | Admitting: Internal Medicine

## 2020-01-14 ENCOUNTER — Inpatient Hospital Stay: Payer: Medicare HMO | Attending: Internal Medicine

## 2020-01-14 ENCOUNTER — Other Ambulatory Visit: Payer: Self-pay

## 2020-01-14 ENCOUNTER — Inpatient Hospital Stay: Payer: Medicare HMO

## 2020-01-14 VITALS — BP 142/69 | HR 70 | Temp 97.8°F | Resp 18 | Ht 71.0 in | Wt 226.0 lb

## 2020-01-14 DIAGNOSIS — Z7952 Long term (current) use of systemic steroids: Secondary | ICD-10-CM | POA: Diagnosis not present

## 2020-01-14 DIAGNOSIS — R232 Flushing: Secondary | ICD-10-CM | POA: Diagnosis not present

## 2020-01-14 DIAGNOSIS — Z5112 Encounter for antineoplastic immunotherapy: Secondary | ICD-10-CM | POA: Insufficient documentation

## 2020-01-14 DIAGNOSIS — C9000 Multiple myeloma not having achieved remission: Secondary | ICD-10-CM | POA: Insufficient documentation

## 2020-01-14 LAB — CBC WITH DIFFERENTIAL (CANCER CENTER ONLY)
Abs Immature Granulocytes: 0.02 10*3/uL (ref 0.00–0.07)
Basophils Absolute: 0.1 10*3/uL (ref 0.0–0.1)
Basophils Relative: 1 %
Eosinophils Absolute: 0.3 10*3/uL (ref 0.0–0.5)
Eosinophils Relative: 4 %
HCT: 42.7 % (ref 39.0–52.0)
Hemoglobin: 14.5 g/dL (ref 13.0–17.0)
Immature Granulocytes: 0 %
Lymphocytes Relative: 12 %
Lymphs Abs: 0.8 10*3/uL (ref 0.7–4.0)
MCH: 34.2 pg — ABNORMAL HIGH (ref 26.0–34.0)
MCHC: 34 g/dL (ref 30.0–36.0)
MCV: 100.7 fL — ABNORMAL HIGH (ref 80.0–100.0)
Monocytes Absolute: 0.8 10*3/uL (ref 0.1–1.0)
Monocytes Relative: 11 %
Neutro Abs: 5.2 10*3/uL (ref 1.7–7.7)
Neutrophils Relative %: 72 %
Platelet Count: 193 10*3/uL (ref 150–400)
RBC: 4.24 MIL/uL (ref 4.22–5.81)
RDW: 12.4 % (ref 11.5–15.5)
WBC Count: 7.1 10*3/uL (ref 4.0–10.5)
nRBC: 0 % (ref 0.0–0.2)

## 2020-01-14 LAB — CMP (CANCER CENTER ONLY)
ALT: 39 U/L (ref 0–44)
AST: 51 U/L — ABNORMAL HIGH (ref 15–41)
Albumin: 3.7 g/dL (ref 3.5–5.0)
Alkaline Phosphatase: 86 U/L (ref 38–126)
Anion gap: 12 (ref 5–15)
BUN: 9 mg/dL (ref 8–23)
CO2: 24 mmol/L (ref 22–32)
Calcium: 9.7 mg/dL (ref 8.9–10.3)
Chloride: 102 mmol/L (ref 98–111)
Creatinine: 0.75 mg/dL (ref 0.61–1.24)
GFR, Est AFR Am: >60 mL/min (ref >60)
GFR, Estimated: >60 mL/min (ref >60)
Glucose, Bld: 121 mg/dL — ABNORMAL HIGH (ref 70–99)
Potassium: 4.4 mmol/L (ref 3.5–5.1)
Sodium: 138 mmol/L (ref 135–145)
Total Bilirubin: 1.5 mg/dL — ABNORMAL HIGH (ref 0.3–1.2)
Total Protein: 8.7 g/dL — ABNORMAL HIGH (ref 6.5–8.1)

## 2020-01-14 MED ORDER — BORTEZOMIB CHEMO SQ INJECTION 3.5 MG (2.5MG/ML)
1.3000 mg/m2 | Freq: Once | INTRAMUSCULAR | Status: AC
  Administered 2020-01-14: 13:00:00 3 mg via SUBCUTANEOUS
  Filled 2020-01-14: qty 1.2

## 2020-01-14 MED ORDER — PROCHLORPERAZINE MALEATE 10 MG PO TABS
ORAL_TABLET | ORAL | Status: AC
  Filled 2020-01-14: qty 1

## 2020-01-14 MED ORDER — PROCHLORPERAZINE MALEATE 10 MG PO TABS
10.0000 mg | ORAL_TABLET | Freq: Once | ORAL | Status: AC
  Administered 2020-01-14: 10 mg via ORAL

## 2020-01-14 NOTE — Progress Notes (Signed)
Met with patient at registration to obtain signature for [employer].  Patient approved for one-time $1000 [employer] grant to assist with personal expenses. Explained in detail how grant expenses are covered. Gave him a copy of the approval letter as well as the expense sheet and Outpatient pharmacy information. He received a gas card today from the grant.  He has my card for any additional financial questions or concerns.

## 2020-01-14 NOTE — Patient Instructions (Signed)
[facility] Discharge Instructions for Patients Receiving Chemotherapy  Today you received the following chemotherapy agents:  Bortezomib  To help prevent nausea and vomiting after your treatment, we encourage you to take your nausea medication as needed & prescribed. Bortezomib injection What is this medicine? BORTEZOMIB (bor TEZ oh mib) is a medicine that targets proteins in cancer cells and stops the cancer cells from growing. It is used to treat multiple myeloma and mantle-cell lymphoma. This medicine may be used for other purposes; ask your health care provider or pharmacist if you have questions. COMMON BRAND NAME(S): Velcade What should I tell my health care provider before I take this medicine? They need to know if you have any of these conditions: diabetes heart disease irregular heartbeat liver disease on hemodialysis low blood counts, like low white blood cells, platelets, or hemoglobin peripheral neuropathy taking medicine for blood pressure an unusual or allergic reaction to bortezomib, mannitol, boron, other medicines, foods, dyes, or preservatives pregnant or trying to get pregnant breast-feeding How should I use this medicine? This medicine is for injection into a vein or for injection under the skin. It is given by a health care professional in a hospital or clinic setting. Talk to your pediatrician regarding the use of this medicine in children. Special care may be needed. Overdosage: If you think you have taken too much of this medicine contact a poison control center or emergency room at once. NOTE: This medicine is only for you. Do not share this medicine with others. What if I miss a dose? It is important not to miss your dose. Call your doctor or health care professional if you are unable to keep an appointment. What may interact with this medicine? This medicine may interact with the following medications: ketoconazole rifampin ritonavir [facility].  John's Wort This list may not describe all possible interactions. Give your health care provider a list of all the medicines, herbs, non-prescription drugs, or dietary supplements you use. Also tell them if you smoke, drink alcohol, or use illegal drugs. Some items may interact with your medicine. What should I watch for while using this medicine? You may get drowsy or dizzy. Do not drive, use machinery, or do anything that needs mental alertness until you know how this medicine affects you. Do not stand or sit up quickly, especially if you are an older patient. This reduces the risk of dizzy or fainting spells. In some cases, you may be given additional medicines to help with side effects. Follow all directions for their use. Call your doctor or health care professional for advice if you get a fever, chills or sore throat, or other symptoms of a cold or flu. Do not treat yourself. This drug decreases your body's ability to fight infections. Try to avoid being around people who are sick. This medicine may increase your risk to bruise or bleed. Call your doctor or health care professional if you notice any unusual bleeding. You may need blood work done while you are taking this medicine. In some patients, this medicine may cause a serious brain infection that may cause death. If you have any problems seeing, thinking, speaking, walking, or standing, tell your doctor right away. If you cannot reach your doctor, urgently seek other source of medical care. Check with your doctor or health care professional if you get an attack of severe diarrhea, nausea and vomiting, or if you sweat a lot. The loss of too much body fluid can make it dangerous for  you to take this medicine. Do not become pregnant while taking this medicine or for at least 7 months after stopping it. Women should inform their doctor if they wish to become pregnant or think they might be pregnant. Men should not father a child while taking this  medicine and for at least 4 months after stopping it. There is a potential for serious side effects to an unborn child. Talk to your health care professional or pharmacist for more information. Do not breast-feed an infant while taking this medicine or for 2 months after stopping it. This medicine may interfere with the ability to have a child. You should talk with your doctor or health care professional if you are concerned about your fertility. What side effects may I notice from receiving this medicine? Side effects that you should report to your doctor or health care professional as soon as possible: allergic reactions like skin rash, itching or hives, swelling of the face, lips, or tongue breathing problems changes in hearing changes in vision fast, irregular heartbeat feeling faint or lightheaded, falls pain, tingling, numbness in the hands or feet right upper belly pain seizures swelling of the ankles, feet, hands unusual bleeding or bruising unusually weak or tired vomiting yellowing of the eyes or skin Side effects that usually do not require medical attention (report to your doctor or health care professional if they continue or are bothersome): changes in emotions or moods constipation diarrhea loss of appetite headache irritation at site where injected nausea This list may not describe all possible side effects. Call your doctor for medical advice about side effects. You may report side effects to FDA at 1-800-FDA-1088. Where should I keep my medicine? This drug is given in a hospital or clinic and will not be stored at home. NOTE: This sheet is a summary. It may not cover all possible information. If you have questions about this medicine, talk to your doctor, pharmacist, or health care provider.  2020 Elsevier/Gold Standard (2018-01-08 16:29:31)    If you develop nausea and vomiting that is not controlled by your nausea medication, call the clinic.   BELOW ARE  SYMPTOMS THAT SHOULD BE REPORTED IMMEDIATELY:  *FEVER GREATER THAN 100.5 F  *CHILLS WITH OR WITHOUT FEVER  NAUSEA AND VOMITING THAT IS NOT CONTROLLED WITH YOUR NAUSEA MEDICATION  *UNUSUAL SHORTNESS OF BREATH  *UNUSUAL BRUISING OR BLEEDING  TENDERNESS IN MOUTH AND THROAT WITH OR WITHOUT PRESENCE OF ULCERS  *URINARY PROBLEMS  *BOWEL PROBLEMS  UNUSUAL RASH Items with * indicate a potential emergency and should be followed up as soon as possible.  Feel free to call the clinic should you have any questions or concerns. The clinic phone number is [phone].  Please show the Portal at check-in to the Emergency Department and triage nurse.

## 2020-01-15 ENCOUNTER — Encounter: Payer: Self-pay | Admitting: Internal Medicine

## 2020-01-15 NOTE — Progress Notes (Signed)
Patient called regarding some grant questions. Answered questions and explained in detail how everything works.  Received permission from patient to apply for copay assistance on his behalf for infused treatment drugs.  Applied on behalf of patient through [employer] for copay assistance.  Patient approved for $11,000 guaranteed amount  01/15/20 - 01/14/21 with a look back date of 07/19/19 to assist with copays after insurance pays their portion. A copy of the approval letter and POE(proof of expenditure) form given to [employer] for billing/ copay claim submissions. The patient will receive a copy in the mail as well for his records only.  He has my card for any additional financial questions or concerns.

## 2020-01-15 NOTE — Progress Notes (Signed)
Pharmacist Chemotherapy Monitoring - Follow Up Assessment    I verify that I have reviewed each item in the below checklist:  . Regimen for the patient is scheduled for the appropriate day and plan matches scheduled date. Marland Kitchen Appropriate non-routine labs are ordered dependent on drug ordered. . If applicable, additional medications reviewed and ordered per protocol based on lifetime cumulative doses and/or treatment regimen.   Plan for follow-up and/or issues identified: No . I-vent associated with next due treatment: No . MD and/or nursing notified: No  [name] [name] 01/15/2020 10:47 AM

## 2020-01-20 ENCOUNTER — Other Ambulatory Visit: Payer: Self-pay | Admitting: Medical Oncology

## 2020-01-20 DIAGNOSIS — C9000 Multiple myeloma not having achieved remission: Secondary | ICD-10-CM

## 2020-01-20 NOTE — Progress Notes (Signed)
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 daily for 14 days every 3 weeks in addition to weekly Decadron 40 mg orally. Status post day 1 cycle #1  INTERVAL HISTORY: [name] 79 y.o. male returns to the clinic for a follow up visit. The patient recently started treatment with velcade, revlimid, and decadron and he is tolerating it well except for facial flushing from the decadron and feeling hot. He also mentions he had some mild injection site irritation after his first injection. He denies any night sweats, weight loss, or lymphadenopathy. He denies chest pain, shortness of breath, or cough. He denies any signs or symptoms of infection including nasal congestion, dysuria, skin infections, or diarrhea. He denies peripheral neuropathy. He denies nausea, vomiting, abdominal pain, early satiety. He is here for day 8 of cycle 1.    MEDICAL HISTORY: Past Medical History:  Diagnosis Date  . Atrial flutter ([facility])    A. s/p prior ablation;  B.  s/p CTI ablation 10/24/10 (Dr. Caryl Comes)  . CAD (coronary artery disease)    A.  s/p CFX in the past;   B.  cath 06/2010: LAD 30-40%, D1 50%, OM1 50%, AVCFX stent 30%, dCFX 70-80% (med Rx), mRCA 40%;      C.  Echo 06/2010: EF 60-65%, mod LVH; mild LAE     . Complete heart block ([facility])    a. s/p STJ pacemaker  . Cubital tunnel syndrome   . Degenerative disc disease   . Heart block   . HOH (hard of hearing)   . Hx of adenomatous colonic polyps   . Hyperlipidemia   . Hypertension   . Multiple myeloma   . Persistent atrial fibrillation ([facility])   . Sleep apnea   . Venous insufficiency     ALLERGIES:  is allergic to bee venom.  MEDICATIONS:  Current Outpatient Medications  Medication Sig Dispense  Refill  . acyclovir (ZOVIRAX) 200 MG capsule Take 1 capsule (200 mg total) by mouth 2 (two) times daily. 60 capsule 2  . atorvastatin (LIPITOR) 10 MG tablet Take 10 mg by mouth daily at 6 PM.     . dexamethasone (DECADRON) 4 MG tablet 10 tablet p.o. daily weekly start with the first day of the chemotherapy. 40 tablet 3  . EPINEPHrine 0.3 mg/0.3 mL IJ SOAJ injection Inject 0.3 mg into the muscle once as needed for anaphylaxis.    . finasteride (PROSCAR) 5 MG tablet Take 5 mg by mouth daily.    . furosemide (LASIX) 40 MG tablet Take 1 tablet by mouth once daily (Patient taking differently: Take 40 mg by mouth daily. ) 30 tablet 6  . ketotifen (ALAWAY) 0.025 % ophthalmic solution Place 1 drop into both eyes 2 (two) times daily as needed (for dry eyes).     Marland Kitchen lenalidomide (REVLIMID) 25 MG capsule Take 1 capsule (25 mg total) by mouth daily. Take for 14 days, then hold for 7 days. Repeat every 21 days. 14 capsule 0  . losartan (COZAAR) 100 MG tablet Take 100 mg by mouth daily.    . Magnesium 200 MG TABS Take 1 tablet (200 mg total) by mouth daily. 30 each   . Multiple Vitamin (MULTIVITAMIN) capsule Take 1 capsule by mouth daily.      Marland Kitchen  nitroGLYCERIN (NITROSTAT) 0.4 MG SL tablet PLACE ONE TABLET UNDER THE TONGUE EVERY 5 MINUTES AS NEEDED FOR CHEST PAIN (Patient taking differently: Place 0.4 mg under the tongue every 5 (five) minutes as needed for chest pain. ) 75 tablet 0  . Omega-3 Fatty Acids (FISH OIL) 1200 MG CAPS Take 1,200 mg by mouth daily.     . Potassium Chloride ER 20 MEQ TBCR Take 1 tablet by mouth once daily 90 tablet 3  . prochlorperazine (COMPAZINE) 10 MG tablet Take 1 tablet (10 mg total) by mouth every 6 (six) hours as needed for nausea or vomiting. 30 tablet 0  . thiamine 100 MG tablet Take 100 mg by mouth daily.    . Vitamins A & D (VITAMIN A & D) ointment Apply 1 application topically as needed for dry skin.    Marland Kitchen XARELTO 20 MG TABS tablet TAKE 1 TABLET BY MOUTH ONCE DAILY WITH SUPPER  (Patient taking differently: Take 20 mg by mouth daily with supper. ) 90 tablet 1   No current facility-administered medications for this visit.    SURGICAL HISTORY:  Past Surgical History:  Procedure Laterality Date  . CARDIOVERSION N/A 04/20/2018   Procedure: CARDIOVERSION;  Surgeon: Skeet Latch, MD;  Location: [facility];  Service: Cardiovascular;  Laterality: N/A;  . COLONOSCOPY  10-07-2009   TA polyp, tics, hems   . CORONARY ANGIOPLASTY WITH STENT PLACEMENT  2005  . PACEMAKER PLACEMENT  2011  . POLYPECTOMY    . PPM GENERATOR CHANGEOUT N/A 09/27/2019   Procedure: PPM GENERATOR CHANGEOUT;  Surgeon: Deboraha Sprang, MD;  Location: [facility];  Service: Cardiovascular;  Laterality: N/A;  . Right olecranon nerve surgery    . VENTRAL HERNIA REPAIR      REVIEW OF SYSTEMS:   Review of Systems  Constitutional: Negative for appetite change, chills, fatigue, fever and unexpected weight change.  HENT: Positive for occasional mild/self limiting nosebleeds. Negative for mouth sores, sore throat and trouble swallowing.   Eyes: Negative for eye problems and icterus.  Respiratory: Negative for cough, hemoptysis, shortness of breath and wheezing.   Cardiovascular: Positive for chronic bilateral leg swelling. Negative for chest pain. Gastrointestinal: Negative for abdominal pain, constipation, diarrhea, nausea and vomiting.  Genitourinary: Negative for bladder incontinence, difficulty urinating, dysuria, frequency and hematuria.   Musculoskeletal: Negative for back pain, gait problem, neck pain and neck stiffness.  Skin: Negative for itching and rash.  Neurological: Negative for dizziness, extremity weakness, gait problem, headaches, light-headedness and seizures.  Hematological: Negative for adenopathy. Does not bruise/bleed easily.  Psychiatric/Behavioral: Negative for confusion, depression and sleep disturbance. The patient is not nervous/anxious.     PHYSICAL EXAMINATION:   There were no vitals taken for this visit.  ECOG PERFORMANCE STATUS: 2 - Symptomatic, <50% confined to bed  Physical Exam  Constitutional: Oriented to person, place, and time and well-developed, well-nourished, and in no distress.  HENT:  Head: Normocephalic and atraumatic.  Mouth/Throat: Oropharynx is clear and moist. No oropharyngeal exudate.  Eyes: Conjunctivae are normal. Right eye exhibits no discharge. Left eye exhibits no discharge. No scleral icterus.  Neck: Normal range of motion. Neck supple.  Cardiovascular: Normal rate, regular rhythm, normal heart sounds and intact distal pulses.   Pulmonary/Chest: Effort normal and breath sounds normal. No respiratory distress. No wheezes. No rales.  Abdominal: Soft. Bowel sounds are normal. Exhibits no distension and no mass. There is no tenderness.  Musculoskeletal: Bilateral lower extremity pitting edema (L>R). Normal range of motion.  Lymphadenopathy:    No cervical adenopathy.  Neurological: Alert and oriented to person, place, and time. Exhibits normal muscle tone. Patient examined in the wheelchair.  Skin: Skin is warm and dry. No rash noted. Not diaphoretic. No erythema. No pallor.  Psychiatric: Mood, memory and judgment normal.  Vitals reviewed.  LABORATORY DATA: Lab Results  Component Value Date   WBC 7.1 01/14/2020   HGB 14.5 01/14/2020   HCT 42.7 01/14/2020   MCV 100.7 (H) 01/14/2020   PLT 193 01/14/2020      Chemistry      Component Value Date/Time   NA 138 01/14/2020 1032   NA 134 09/24/2019 0904   NA 136 06/06/2017 1058   K 4.4 01/14/2020 1032   K 4.0 06/06/2017 1058   CL 102 01/14/2020 1032   CL 97 (L) 12/03/2012 1015   CO2 24 01/14/2020 1032   CO2 24 06/06/2017 1058   BUN 9 01/14/2020 1032   BUN 8 09/24/2019 0904   BUN 5.6 (L) 06/06/2017 1058   CREATININE 0.75 01/14/2020 1032   CREATININE 0.8 06/06/2017 1058      Component Value Date/Time   CALCIUM 9.7 01/14/2020 1032   CALCIUM 9.1 06/06/2017 1058    ALKPHOS 86 01/14/2020 1032   ALKPHOS 61 06/06/2017 1058   AST 51 (H) 01/14/2020 1032   AST 36 (H) 06/06/2017 1058   ALT 39 01/14/2020 1032   ALT 23 06/06/2017 1058   BILITOT 1.5 (H) 01/14/2020 1032   BILITOT 0.46 06/06/2017 1058       RADIOGRAPHIC STUDIES:  CUP PACEART REMOTE DEVICE CHECK  Result Date: 12/27/2019 Scheduled remote reviewed. Normal device function.  100% AF burden. Takes Xarelto. Next remote 91 days. Felisa Bonier, RN, MSN    ASSESSMENT/PLAN:  This is a very pleasant 79 year old caucasian male with smoldering multiple myeloma. He was August 2011. He had been on observation since that time.   He recently had a myeloma panel performed which showed continued increase in IgM as well as free lambda light chain.   The patient recently started treatment with subcutaneous Velcade 1.3 mg/M2 weekly in addition to Revlimid 25 mg p.o. for 14 days every 3 weeks and weekly Decadron 40 mg orally. He is status post day 1 cycle 1 and tolerated it well except for facial flushing and feeling hot from the decadron.   Labs were reviewed. Recommend that he proceed with day 8 cycle #1.   We will see him back for a follow up visit in 2 weeks before starting day 1 cycle #2.   He will continue to take his decadron and allopurinol as prescribed.   The patient was advised to call immediately if he has any concerning symptoms in the interval. The patient voices understanding of current disease status and treatment options and is in agreement with the current care plan. All questions were answered. The patient knows to call the clinic with any problems, questions or concerns. We can certainly see the patient much sooner if necessary  No orders of the defined types were placed in this encounter.    Salahuddin Arismendez L Daniella Dewberry, PA-C 01/20/20

## 2020-01-21 ENCOUNTER — Other Ambulatory Visit: Payer: Medicare HMO

## 2020-01-21 ENCOUNTER — Inpatient Hospital Stay: Payer: Medicare HMO | Admitting: Physician Assistant

## 2020-01-21 ENCOUNTER — Inpatient Hospital Stay: Payer: Medicare HMO

## 2020-01-21 ENCOUNTER — Other Ambulatory Visit: Payer: Self-pay

## 2020-01-21 VITALS — BP 130/56 | HR 70 | Temp 98.2°F | Resp 18 | Ht 71.0 in | Wt 227.9 lb

## 2020-01-21 DIAGNOSIS — Z5112 Encounter for antineoplastic immunotherapy: Secondary | ICD-10-CM | POA: Diagnosis not present

## 2020-01-21 DIAGNOSIS — Z5111 Encounter for antineoplastic chemotherapy: Secondary | ICD-10-CM | POA: Diagnosis not present

## 2020-01-21 DIAGNOSIS — Z7952 Long term (current) use of systemic steroids: Secondary | ICD-10-CM | POA: Diagnosis not present

## 2020-01-21 DIAGNOSIS — R232 Flushing: Secondary | ICD-10-CM | POA: Diagnosis not present

## 2020-01-21 DIAGNOSIS — C9000 Multiple myeloma not having achieved remission: Secondary | ICD-10-CM

## 2020-01-21 LAB — CMP (CANCER CENTER ONLY)
ALT: 39 U/L (ref 0–44)
AST: 33 U/L (ref 15–41)
Albumin: 3.4 g/dL — ABNORMAL LOW (ref 3.5–5.0)
Alkaline Phosphatase: 80 U/L (ref 38–126)
Anion gap: 12 (ref 5–15)
BUN: 10 mg/dL (ref 8–23)
CO2: 23 mmol/L (ref 22–32)
Calcium: 9.7 mg/dL (ref 8.9–10.3)
Chloride: 100 mmol/L (ref 98–111)
Creatinine: 0.76 mg/dL (ref 0.61–1.24)
GFR, Est AFR Am: >60 mL/min
GFR, Estimated: >60 mL/min
Glucose, Bld: 103 mg/dL — ABNORMAL HIGH (ref 70–99)
Potassium: 4.5 mmol/L (ref 3.5–5.1)
Sodium: 135 mmol/L (ref 135–145)
Total Bilirubin: 1.2 mg/dL (ref 0.3–1.2)
Total Protein: 8.1 g/dL (ref 6.5–8.1)

## 2020-01-21 LAB — CBC WITH DIFFERENTIAL (CANCER CENTER ONLY)
Abs Immature Granulocytes: 0.06 10*3/uL (ref 0.00–0.07)
Basophils Absolute: 0 10*3/uL (ref 0.0–0.1)
Basophils Relative: 0 %
Eosinophils Absolute: 0.1 10*3/uL (ref 0.0–0.5)
Eosinophils Relative: 2 %
HCT: 42.5 % (ref 39.0–52.0)
Hemoglobin: 14.4 g/dL (ref 13.0–17.0)
Immature Granulocytes: 1 %
Lymphocytes Relative: 5 %
Lymphs Abs: 0.4 10*3/uL — ABNORMAL LOW (ref 0.7–4.0)
MCH: 33.6 pg (ref 26.0–34.0)
MCHC: 33.9 g/dL (ref 30.0–36.0)
MCV: 99.3 fL (ref 80.0–100.0)
Monocytes Absolute: 0.2 10*3/uL (ref 0.1–1.0)
Monocytes Relative: 3 %
Neutro Abs: 6.7 10*3/uL (ref 1.7–7.7)
Neutrophils Relative %: 89 %
Platelet Count: 157 10*3/uL (ref 150–400)
RBC: 4.28 MIL/uL (ref 4.22–5.81)
RDW: 12.3 % (ref 11.5–15.5)
WBC Count: 7.5 10*3/uL (ref 4.0–10.5)
nRBC: 0 % (ref 0.0–0.2)

## 2020-01-21 MED ORDER — PROCHLORPERAZINE MALEATE 10 MG PO TABS
ORAL_TABLET | ORAL | Status: AC
  Filled 2020-01-21: qty 1

## 2020-01-21 MED ORDER — PROCHLORPERAZINE MALEATE 10 MG PO TABS
10.0000 mg | ORAL_TABLET | Freq: Once | ORAL | Status: AC
  Administered 2020-01-21: 10 mg via ORAL

## 2020-01-21 MED ORDER — BORTEZOMIB CHEMO SQ INJECTION 3.5 MG (2.5MG/ML)
1.3000 mg/m2 | Freq: Once | INTRAMUSCULAR | Status: AC
  Administered 2020-01-21: 3 mg via SUBCUTANEOUS
  Filled 2020-01-21: qty 1.2

## 2020-01-21 NOTE — Patient Instructions (Signed)
[facility] Discharge Instructions for Patients Receiving Chemotherapy  Today you received the following chemotherapy agents Bortezomib (VELCADE).  To help prevent nausea and vomiting after your treatment, we encourage you to take your nausea medication as prescribed.   If you develop nausea and vomiting that is not controlled by your nausea medication, call the clinic.   BELOW ARE SYMPTOMS THAT SHOULD BE REPORTED IMMEDIATELY:  *FEVER GREATER THAN 100.5 F  *CHILLS WITH OR WITHOUT FEVER  NAUSEA AND VOMITING THAT IS NOT CONTROLLED WITH YOUR NAUSEA MEDICATION  *UNUSUAL SHORTNESS OF BREATH  *UNUSUAL BRUISING OR BLEEDING  TENDERNESS IN MOUTH AND THROAT WITH OR WITHOUT PRESENCE OF ULCERS  *URINARY PROBLEMS  *BOWEL PROBLEMS  UNUSUAL RASH Items with * indicate a potential emergency and should be followed up as soon as possible.  Feel free to call the clinic should you have any questions or concerns. The clinic phone number is [phone].  Please show the CHEMO ALERT CARD at check-in to the Emergency Department and triage nurse.   

## 2020-01-22 NOTE — Progress Notes (Signed)
Pharmacist Chemotherapy Monitoring - Follow Up Assessment    I verify that I have reviewed each item in the below checklist:  . Regimen for the patient is scheduled for the appropriate day and plan matches scheduled date. Marland Kitchen Appropriate non-routine labs are ordered dependent on drug ordered. . If applicable, additional medications reviewed and ordered per protocol based on lifetime cumulative doses and/or treatment regimen.   Plan for follow-up and/or issues identified: No . I-vent associated with next due treatment: No . MD and/or nursing notified: No  Romualdo Bolk [facility] 01/22/2020 10:14 AM

## 2020-01-28 ENCOUNTER — Inpatient Hospital Stay: Payer: Medicare HMO

## 2020-01-28 ENCOUNTER — Other Ambulatory Visit: Payer: Self-pay

## 2020-01-28 VITALS — BP 125/59 | HR 70 | Temp 98.2°F | Resp 18

## 2020-01-28 DIAGNOSIS — C9000 Multiple myeloma not having achieved remission: Secondary | ICD-10-CM

## 2020-01-28 DIAGNOSIS — Z5112 Encounter for antineoplastic immunotherapy: Secondary | ICD-10-CM | POA: Diagnosis not present

## 2020-01-28 DIAGNOSIS — Z7952 Long term (current) use of systemic steroids: Secondary | ICD-10-CM | POA: Diagnosis not present

## 2020-01-28 DIAGNOSIS — R232 Flushing: Secondary | ICD-10-CM | POA: Diagnosis not present

## 2020-01-28 LAB — CMP (CANCER CENTER ONLY)
ALT: 37 U/L (ref 0–44)
AST: 27 U/L (ref 15–41)
Albumin: 3.3 g/dL — ABNORMAL LOW (ref 3.5–5.0)
Alkaline Phosphatase: 90 U/L (ref 38–126)
Anion gap: 13 (ref 5–15)
BUN: 12 mg/dL (ref 8–23)
CO2: 23 mmol/L (ref 22–32)
Calcium: 9.2 mg/dL (ref 8.9–10.3)
Chloride: 99 mmol/L (ref 98–111)
Creatinine: 0.76 mg/dL (ref 0.61–1.24)
GFR, Est AFR Am: >60 mL/min (ref >60)
GFR, Estimated: >60 mL/min (ref >60)
Glucose, Bld: 104 mg/dL — ABNORMAL HIGH (ref 70–99)
Potassium: 4.5 mmol/L (ref 3.5–5.1)
Sodium: 135 mmol/L (ref 135–145)
Total Bilirubin: 1 mg/dL (ref 0.3–1.2)
Total Protein: 7.8 g/dL (ref 6.5–8.1)

## 2020-01-28 LAB — CBC WITH DIFFERENTIAL (CANCER CENTER ONLY)
Abs Immature Granulocytes: 0.11 10*3/uL — ABNORMAL HIGH (ref 0.00–0.07)
Basophils Absolute: 0 10*3/uL (ref 0.0–0.1)
Basophils Relative: 0 %
Eosinophils Absolute: 0.1 10*3/uL (ref 0.0–0.5)
Eosinophils Relative: 1 %
HCT: 40.8 % (ref 39.0–52.0)
Hemoglobin: 14 g/dL (ref 13.0–17.0)
Immature Granulocytes: 1 %
Lymphocytes Relative: 4 %
Lymphs Abs: 0.4 10*3/uL — ABNORMAL LOW (ref 0.7–4.0)
MCH: 34.3 pg — ABNORMAL HIGH (ref 26.0–34.0)
MCHC: 34.3 g/dL (ref 30.0–36.0)
MCV: 100 fL (ref 80.0–100.0)
Monocytes Absolute: 0.3 10*3/uL (ref 0.1–1.0)
Monocytes Relative: 3 %
Neutro Abs: 9.4 10*3/uL — ABNORMAL HIGH (ref 1.7–7.7)
Neutrophils Relative %: 91 %
Platelet Count: 168 10*3/uL (ref 150–400)
RBC: 4.08 MIL/uL — ABNORMAL LOW (ref 4.22–5.81)
RDW: 12.3 % (ref 11.5–15.5)
WBC Count: 10.3 10*3/uL (ref 4.0–10.5)
nRBC: 0 % (ref 0.0–0.2)

## 2020-01-28 MED ORDER — PROCHLORPERAZINE MALEATE 10 MG PO TABS
ORAL_TABLET | ORAL | Status: AC
  Filled 2020-01-28: qty 1

## 2020-01-28 MED ORDER — BORTEZOMIB CHEMO SQ INJECTION 3.5 MG (2.5MG/ML)
1.3000 mg/m2 | Freq: Once | INTRAMUSCULAR | Status: AC
  Administered 2020-01-28: 3 mg via SUBCUTANEOUS
  Filled 2020-01-28: qty 1.2

## 2020-01-28 MED ORDER — PROCHLORPERAZINE MALEATE 10 MG PO TABS
10.0000 mg | ORAL_TABLET | Freq: Once | ORAL | Status: AC
  Administered 2020-01-28: 10 mg via ORAL

## 2020-01-28 NOTE — Patient Instructions (Signed)
[facility] Discharge Instructions for Patients Receiving Chemotherapy  Today you received the following chemotherapy agents Velcade.  To help prevent nausea and vomiting after your treatment, we encourage you to take your nausea medication as directed.  If you develop nausea and vomiting that is not controlled by your nausea medication, call the clinic.   BELOW ARE SYMPTOMS THAT SHOULD BE REPORTED IMMEDIATELY:  *FEVER GREATER THAN 100.5 F  *CHILLS WITH OR WITHOUT FEVER  NAUSEA AND VOMITING THAT IS NOT CONTROLLED WITH YOUR NAUSEA MEDICATION  *UNUSUAL SHORTNESS OF BREATH  *UNUSUAL BRUISING OR BLEEDING  TENDERNESS IN MOUTH AND THROAT WITH OR WITHOUT PRESENCE OF ULCERS  *URINARY PROBLEMS  *BOWEL PROBLEMS  UNUSUAL RASH Items with * indicate a potential emergency and should be followed up as soon as possible.  Feel free to call the clinic should you have any questions or concerns. The clinic phone number is [phone].  Please show the CHEMO ALERT CARD at check-in to the Emergency Department and triage nurse.   

## 2020-01-29 ENCOUNTER — Other Ambulatory Visit: Payer: Self-pay | Admitting: Medical Oncology

## 2020-01-29 DIAGNOSIS — C9000 Multiple myeloma not having achieved remission: Secondary | ICD-10-CM

## 2020-01-29 MED ORDER — LENALIDOMIDE 25 MG PO CAPS: 25.0000 mg | ORAL_CAPSULE | Freq: Every day | ORAL | 0 refills | Status: DC

## 2020-01-29 NOTE — Progress Notes (Signed)
Pharmacist Chemotherapy Monitoring - Follow Up Assessment    I verify that I have reviewed each item in the below checklist:  . Regimen for the patient is scheduled for the appropriate day and plan matches scheduled date. Marland Kitchen Appropriate non-routine labs are ordered dependent on drug ordered. . If applicable, additional medications reviewed and ordered per protocol based on lifetime cumulative doses and/or treatment regimen.   Plan for follow-up and/or issues identified: No . I-vent associated with next due treatment: No . MD and/or nursing notified: No  [name] K 01/29/2020 10:33 AM

## 2020-01-31 ENCOUNTER — Telehealth: Payer: Self-pay

## 2020-01-31 NOTE — Telephone Encounter (Signed)
Received message from after hours line stating patient called with complaints of a rash that had developed at the injection site  where he received his chemo and on his chest. Patient received velcade on 01/28/20 and rash developed on 01/29/20. Patient was advised by S. Haskins, RN to take benadryl PO and also use hydrocortisone cream on the area of the rash.  Follow-up call to patient made by this nurse. Patient states rash has improved slightly with the use of benadryl and the hydrocortisone cream. Patient's next appointment with Dr. Julien Nordmann is 02/04/20. Cassie H., PA notified. [place] for patient to follow-up as scheduled with Dr. Julien Nordmann.  Patient informed if rash becomes worse before his next appointment to call [phone] for further instructions.  Patient verbalized understanding and all questions were answered during phone call.

## 2020-02-03 ENCOUNTER — Telehealth: Payer: Self-pay | Admitting: Medical Oncology

## 2020-02-03 ENCOUNTER — Other Ambulatory Visit: Payer: Self-pay | Admitting: Internal Medicine

## 2020-02-03 NOTE — Telephone Encounter (Signed)
Pt's age 79, wt 103.4 kg, SCr 0.76, CrCl 115.27, last ov w/ SK 01/13/20.

## 2020-02-03 NOTE — Telephone Encounter (Signed)
Pt notified that his Revlimd is at Dr [facility] office to pick up tomorrow.

## 2020-02-04 ENCOUNTER — Inpatient Hospital Stay: Payer: Medicare HMO | Admitting: Internal Medicine

## 2020-02-04 ENCOUNTER — Other Ambulatory Visit: Payer: Self-pay

## 2020-02-04 ENCOUNTER — Encounter: Payer: Self-pay | Admitting: Internal Medicine

## 2020-02-04 ENCOUNTER — Inpatient Hospital Stay: Payer: Medicare HMO

## 2020-02-04 VITALS — BP 127/98 | HR 70 | Temp 97.9°F | Resp 18 | Ht 71.0 in | Wt 229.0 lb

## 2020-02-04 DIAGNOSIS — R232 Flushing: Secondary | ICD-10-CM | POA: Diagnosis not present

## 2020-02-04 DIAGNOSIS — Z5112 Encounter for antineoplastic immunotherapy: Secondary | ICD-10-CM | POA: Diagnosis not present

## 2020-02-04 DIAGNOSIS — C9000 Multiple myeloma not having achieved remission: Secondary | ICD-10-CM | POA: Diagnosis not present

## 2020-02-04 DIAGNOSIS — D472 Monoclonal gammopathy: Secondary | ICD-10-CM | POA: Insufficient documentation

## 2020-02-04 DIAGNOSIS — I10 Essential (primary) hypertension: Secondary | ICD-10-CM | POA: Diagnosis not present

## 2020-02-04 DIAGNOSIS — Z7952 Long term (current) use of systemic steroids: Secondary | ICD-10-CM | POA: Diagnosis not present

## 2020-02-04 LAB — CBC WITH DIFFERENTIAL (CANCER CENTER ONLY)
Abs Immature Granulocytes: 0.05 10*3/uL (ref 0.00–0.07)
Basophils Absolute: 0 10*3/uL (ref 0.0–0.1)
Basophils Relative: 0 %
Eosinophils Absolute: 0 10*3/uL (ref 0.0–0.5)
Eosinophils Relative: 0 %
HCT: 39.9 % (ref 39.0–52.0)
Hemoglobin: 13.7 g/dL (ref 13.0–17.0)
Immature Granulocytes: 1 %
Lymphocytes Relative: 3 %
Lymphs Abs: 0.2 10*3/uL — ABNORMAL LOW (ref 0.7–4.0)
MCH: 34 pg (ref 26.0–34.0)
MCHC: 34.3 g/dL (ref 30.0–36.0)
MCV: 99 fL (ref 80.0–100.0)
Monocytes Absolute: 0.4 10*3/uL (ref 0.1–1.0)
Monocytes Relative: 5 %
Neutro Abs: 6.4 10*3/uL (ref 1.7–7.7)
Neutrophils Relative %: 91 %
Platelet Count: 124 10*3/uL — ABNORMAL LOW (ref 150–400)
RBC: 4.03 MIL/uL — ABNORMAL LOW (ref 4.22–5.81)
RDW: 12.2 % (ref 11.5–15.5)
WBC Count: 7.1 10*3/uL (ref 4.0–10.5)
nRBC: 0 % (ref 0.0–0.2)

## 2020-02-04 LAB — CMP (CANCER CENTER ONLY)
ALT: 31 U/L (ref 0–44)
AST: 25 U/L (ref 15–41)
Albumin: 3.3 g/dL — ABNORMAL LOW (ref 3.5–5.0)
Alkaline Phosphatase: 78 U/L (ref 38–126)
Anion gap: 8 (ref 5–15)
BUN: 13 mg/dL (ref 8–23)
CO2: 22 mmol/L (ref 22–32)
Calcium: 8.8 mg/dL — ABNORMAL LOW (ref 8.9–10.3)
Chloride: 103 mmol/L (ref 98–111)
Creatinine: 0.72 mg/dL (ref 0.61–1.24)
GFR, Est AFR Am: >60 mL/min (ref >60)
GFR, Estimated: >60 mL/min (ref >60)
Glucose, Bld: 109 mg/dL — ABNORMAL HIGH (ref 70–99)
Potassium: 4.3 mmol/L (ref 3.5–5.1)
Sodium: 133 mmol/L — ABNORMAL LOW (ref 135–145)
Total Bilirubin: 1 mg/dL (ref 0.3–1.2)
Total Protein: 7.3 g/dL (ref 6.5–8.1)

## 2020-02-04 MED ORDER — PROCHLORPERAZINE MALEATE 10 MG PO TABS
10.0000 mg | ORAL_TABLET | Freq: Once | ORAL | Status: AC
  Administered 2020-02-04: 10 mg via ORAL

## 2020-02-04 MED ORDER — BORTEZOMIB CHEMO SQ INJECTION 3.5 MG (2.5MG/ML)
1.3000 mg/m2 | Freq: Once | INTRAMUSCULAR | Status: AC
  Administered 2020-02-04: 3 mg via SUBCUTANEOUS
  Filled 2020-02-04: qty 1.2

## 2020-02-04 NOTE — Patient Instructions (Signed)
[facility] Discharge Instructions for Patients Receiving Chemotherapy  Today you received the following chemotherapy agents Velcade.  To help prevent nausea and vomiting after your treatment, we encourage you to take your nausea medication as directed.  If you develop nausea and vomiting that is not controlled by your nausea medication, call the clinic.   BELOW ARE SYMPTOMS THAT SHOULD BE REPORTED IMMEDIATELY:  *FEVER GREATER THAN 100.5 F  *CHILLS WITH OR WITHOUT FEVER  NAUSEA AND VOMITING THAT IS NOT CONTROLLED WITH YOUR NAUSEA MEDICATION  *UNUSUAL SHORTNESS OF BREATH  *UNUSUAL BRUISING OR BLEEDING  TENDERNESS IN MOUTH AND THROAT WITH OR WITHOUT PRESENCE OF ULCERS  *URINARY PROBLEMS  *BOWEL PROBLEMS  UNUSUAL RASH Items with * indicate a potential emergency and should be followed up as soon as possible.  Feel free to call the clinic should you have any questions or concerns. The clinic phone number is [phone].  Please show the CHEMO ALERT CARD at check-in to the Emergency Department and triage nurse.   

## 2020-02-04 NOTE — Progress Notes (Signed)
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 daily for 14 days every 3 weeks in addition to weekly Decadron 40 mg orally.  Status post 1 cycle.  INTERVAL HISTORY: [name] 79 y.o. male returns to the clinic today for follow-up visit.  The patient is feeling fine today with no concerning complaints except for mild erythema at the site of the Velcade subcutaneous injection.  He tolerated the first cycle of his treatment fairly well.  He denied having any chest pain, shortness of breath, cough or hemoptysis.  He denied having any fever or chills.  He has no nausea, vomiting, diarrhea or constipation.  He has no recent weight loss or night sweats.  He is here today for evaluation before starting cycle #2.  MEDICAL HISTORY: Past Medical History:  Diagnosis Date  . Atrial flutter ([place])    A. s/p prior ablation;  B.  s/p CTI ablation 10/24/10 (Dr. Caryl Comes)  . CAD (coronary artery disease)    A.  s/p CFX in the past;   B.  cath 06/2010: LAD 30-40%, D1 50%, OM1 50%, AVCFX stent 30%, dCFX 70-80% (med Rx), mRCA 40%;      C.  Echo 06/2010: EF 60-65%, mod LVH; mild LAE     . Complete heart block ([facility])    a. s/p STJ pacemaker  . Cubital tunnel syndrome   . Degenerative disc disease   . Heart block   . HOH (hard of hearing)   . Hx of adenomatous colonic polyps   . Hyperlipidemia   . Hypertension   . Multiple myeloma   . Persistent atrial fibrillation ([place])   . Sleep apnea   . Venous insufficiency     ALLERGIES:  is allergic to bee venom.  MEDICATIONS:  Current Outpatient Medications  Medication Sig Dispense Refill  . acyclovir (ZOVIRAX) 200 MG capsule Take 1 capsule (200 mg total) by  mouth 2 (two) times daily. 60 capsule 2  . atorvastatin (LIPITOR) 10 MG tablet Take 10 mg by mouth daily at 6 PM.     . dexamethasone (DECADRON) 4 MG tablet 10 tablet p.o. daily weekly start with the first day of the chemotherapy. 40 tablet 3  . EPINEPHrine 0.3 mg/0.3 mL IJ SOAJ injection Inject 0.3 mg into the muscle once as needed for anaphylaxis.    . finasteride (PROSCAR) 5 MG tablet Take 5 mg by mouth daily.    . furosemide (LASIX) 40 MG tablet Take 1 tablet by mouth once daily (Patient taking differently: Take 40 mg by mouth daily. ) 30 tablet 6  . ketotifen (ALAWAY) 0.025 % ophthalmic solution Place 1 drop into both eyes 2 (two) times daily as needed (for dry eyes).     Marland Kitchen lenalidomide (REVLIMID) 25 MG capsule Take 1 capsule (25 mg total) by mouth daily. Take for 14 days, then hold for 7 days. Repeat every 21 days. 14 capsule 0  . losartan (COZAAR) 100 MG tablet Take 100 mg by mouth daily.    . Magnesium 200 MG TABS Take 1 tablet (200 mg total) by mouth daily. 30 each   . Multiple Vitamin (MULTIVITAMIN) capsule Take 1 capsule by mouth daily.      . nitroGLYCERIN (NITROSTAT) 0.4  MG SL tablet PLACE ONE TABLET UNDER THE TONGUE EVERY 5 MINUTES AS NEEDED FOR CHEST PAIN (Patient not taking: No sig reported) 75 tablet 0  . Omega-3 Fatty Acids (FISH OIL) 1200 MG CAPS Take 1,200 mg by mouth daily.     . Potassium Chloride ER 20 MEQ TBCR Take 1 tablet by mouth once daily 90 tablet 3  . prochlorperazine (COMPAZINE) 10 MG tablet Take 1 tablet (10 mg total) by mouth every 6 (six) hours as needed for nausea or vomiting. 30 tablet 0  . thiamine 100 MG tablet Take 100 mg by mouth daily.    . Vitamins A & D (VITAMIN A & D) ointment Apply 1 application topically as needed for dry skin.    Marland Kitchen XARELTO 20 MG TABS tablet TAKE 1 TABLET BY MOUTH ONCE DAILY WITH SUPPER 90 tablet 1   No current facility-administered medications for this visit.    SURGICAL HISTORY:  Past Surgical History:  Procedure Laterality  Date  . CARDIOVERSION N/A 04/20/2018   Procedure: CARDIOVERSION;  Surgeon: Skeet Latch, MD;  Location: [facility];  Service: Cardiovascular;  Laterality: N/A;  . COLONOSCOPY  10-07-2009   TA polyp, tics, hems   . CORONARY ANGIOPLASTY WITH STENT PLACEMENT  2005  . PACEMAKER PLACEMENT  2011  . POLYPECTOMY    . PPM GENERATOR CHANGEOUT N/A 09/27/2019   Procedure: PPM GENERATOR CHANGEOUT;  Surgeon: Deboraha Sprang, MD;  Location: [facility];  Service: Cardiovascular;  Laterality: N/A;  . Right olecranon nerve surgery    . VENTRAL HERNIA REPAIR      REVIEW OF SYSTEMS:  A comprehensive review of systems was negative except for: Constitutional: positive for fatigue   PHYSICAL EXAMINATION: General appearance: alert, cooperative, fatigued and no distress Head: Normocephalic, without obvious abnormality, atraumatic Neck: no adenopathy, no JVD, supple, symmetrical, trachea midline and thyroid not enlarged, symmetric, no tenderness/mass/nodules Lymph nodes: Cervical, supraclavicular, and axillary nodes normal. Resp: clear to auscultation bilaterally Back: symmetric, no curvature. ROM normal. No CVA tenderness. Cardio: regular rate and rhythm, S1, S2 normal, no murmur, click, rub or gallop GI: soft, non-tender; bowel sounds normal; no masses,  no organomegaly Extremities: extremities normal, atraumatic, no cyanosis or edema  ECOG PERFORMANCE STATUS: 1 - Symptomatic but completely ambulatory  Blood pressure (!) 127/98, pulse 70, temperature 97.9 F (36.6 C), temperature source Temporal, resp. rate 18, height _0  (1.803 m), weight 229 lb (103.9 kg), SpO2 98 %.  LABORATORY DATA: Lab Results  Component Value Date   WBC 7.1 02/04/2020   HGB 13.7 02/04/2020   HCT 39.9 02/04/2020   MCV 99.0 02/04/2020   PLT 124 (L) 02/04/2020      Chemistry      Component Value Date/Time   NA 135 01/28/2020 0829   NA 134 09/24/2019 0904   NA 136 06/06/2017 1058   K 4.5 01/28/2020 0829   K  4.0 06/06/2017 1058   CL 99 01/28/2020 0829   CL 97 (L) 12/03/2012 1015   CO2 23 01/28/2020 0829   CO2 24 06/06/2017 1058   BUN 12 01/28/2020 0829   BUN 8 09/24/2019 0904   BUN 5.6 (L) 06/06/2017 1058   CREATININE 0.76 01/28/2020 0829   CREATININE 0.8 06/06/2017 1058      Component Value Date/Time   CALCIUM 9.2 01/28/2020 0829   CALCIUM 9.1 06/06/2017 1058   ALKPHOS 90 01/28/2020 0829   ALKPHOS 61 06/06/2017 1058   AST 27 01/28/2020 0829   AST 36 (H)  06/06/2017 1058   ALT 37 01/28/2020 0829   ALT 23 06/06/2017 1058   BILITOT 1.0 01/28/2020 0829   BILITOT 0.46 06/06/2017 1058       RADIOGRAPHIC STUDIES: No results found.  ASSESSMENT AND PLAN:  This is a very pleasant 79 years old white male with history of smoldering multiple myeloma diagnosed in August 2011 and has been observation since that time. The patient is doing fine today with no concerning complaints except for increasing fatigue and weakness. His myeloma panel showed continued increase in IgM as well as free lambda light chain. He was given the option of observation versus treatment with weekly subcutaneous Velcade, Revlimid and Decadron and the patient decided to proceed with the treatment.  We will treat him for maximal for cycle and then return back to observation.  He tolerated the first cycle of his treatment very well. I recommended for him to proceed with cycle #2 today as planned. He will come back for follow-up visit in 3 weeks for evaluation before starting cycle #3. He was advised to call immediately if he has any concerning symptoms in the interval. The patient voices understanding of current disease status and treatment options and is in agreement with the current care plan. All questions were answered. The patient knows to call the clinic with any problems, questions or concerns. We can certainly see the patient much sooner if necessary.   Disclaimer: This note was dictated with voice recognition  software. Similar sounding words can inadvertently be transcribed and may not be corrected upon review.

## 2020-02-05 ENCOUNTER — Telehealth: Payer: Self-pay | Admitting: Internal Medicine

## 2020-02-05 NOTE — Telephone Encounter (Signed)
Scheduled appt per 5/25 lo s- pt to get an updated schedule next visit.   

## 2020-02-12 ENCOUNTER — Other Ambulatory Visit: Payer: Self-pay

## 2020-02-12 ENCOUNTER — Inpatient Hospital Stay: Payer: Medicare HMO

## 2020-02-12 ENCOUNTER — Inpatient Hospital Stay: Payer: Medicare HMO | Attending: Internal Medicine

## 2020-02-12 VITALS — BP 120/58 | HR 69 | Temp 98.7°F | Resp 18 | Wt 232.5 lb

## 2020-02-12 DIAGNOSIS — Z5112 Encounter for antineoplastic immunotherapy: Secondary | ICD-10-CM | POA: Insufficient documentation

## 2020-02-12 DIAGNOSIS — C9000 Multiple myeloma not having achieved remission: Secondary | ICD-10-CM | POA: Diagnosis present

## 2020-02-12 DIAGNOSIS — R5383 Other fatigue: Secondary | ICD-10-CM | POA: Diagnosis not present

## 2020-02-12 DIAGNOSIS — Z7952 Long term (current) use of systemic steroids: Secondary | ICD-10-CM | POA: Insufficient documentation

## 2020-02-12 DIAGNOSIS — M79642 Pain in left hand: Secondary | ICD-10-CM | POA: Insufficient documentation

## 2020-02-12 DIAGNOSIS — R531 Weakness: Secondary | ICD-10-CM | POA: Insufficient documentation

## 2020-02-12 LAB — CBC WITH DIFFERENTIAL (CANCER CENTER ONLY)
Abs Immature Granulocytes: 0.55 10*3/uL — ABNORMAL HIGH (ref 0.00–0.07)
Basophils Absolute: 0 10*3/uL (ref 0.0–0.1)
Basophils Relative: 0 %
Eosinophils Absolute: 0 10*3/uL (ref 0.0–0.5)
Eosinophils Relative: 0 %
HCT: 39.3 % (ref 39.0–52.0)
Hemoglobin: 13.8 g/dL (ref 13.0–17.0)
Immature Granulocytes: 7 %
Lymphocytes Relative: 5 %
Lymphs Abs: 0.4 10*3/uL — ABNORMAL LOW (ref 0.7–4.0)
MCH: 34.9 pg — ABNORMAL HIGH (ref 26.0–34.0)
MCHC: 35.1 g/dL (ref 30.0–36.0)
MCV: 99.5 fL (ref 80.0–100.0)
Monocytes Absolute: 0.1 10*3/uL (ref 0.1–1.0)
Monocytes Relative: 2 %
Neutro Abs: 7 10*3/uL (ref 1.7–7.7)
Neutrophils Relative %: 86 %
Platelet Count: 135 10*3/uL — ABNORMAL LOW (ref 150–400)
RBC: 3.95 MIL/uL — ABNORMAL LOW (ref 4.22–5.81)
RDW: 12.4 % (ref 11.5–15.5)
WBC Count: 8.2 10*3/uL (ref 4.0–10.5)
nRBC: 0 % (ref 0.0–0.2)

## 2020-02-12 LAB — CMP (CANCER CENTER ONLY)
ALT: 29 U/L (ref 0–44)
AST: 23 U/L (ref 15–41)
Albumin: 3.3 g/dL — ABNORMAL LOW (ref 3.5–5.0)
Alkaline Phosphatase: 71 U/L (ref 38–126)
Anion gap: 11 (ref 5–15)
BUN: 14 mg/dL (ref 8–23)
CO2: 21 mmol/L — ABNORMAL LOW (ref 22–32)
Calcium: 9.3 mg/dL (ref 8.9–10.3)
Chloride: 101 mmol/L (ref 98–111)
Creatinine: 0.75 mg/dL (ref 0.61–1.24)
GFR, Est AFR Am: >60 mL/min (ref >60)
GFR, Estimated: >60 mL/min (ref >60)
Glucose, Bld: 142 mg/dL — ABNORMAL HIGH (ref 70–99)
Potassium: 4.4 mmol/L (ref 3.5–5.1)
Sodium: 133 mmol/L — ABNORMAL LOW (ref 135–145)
Total Bilirubin: 1.1 mg/dL (ref 0.3–1.2)
Total Protein: 7 g/dL (ref 6.5–8.1)

## 2020-02-12 MED ORDER — BORTEZOMIB CHEMO SQ INJECTION 3.5 MG (2.5MG/ML)
1.3000 mg/m2 | Freq: Once | INTRAMUSCULAR | Status: AC
  Administered 2020-02-12: 3 mg via SUBCUTANEOUS
  Filled 2020-02-12: qty 1.2

## 2020-02-12 MED ORDER — PROCHLORPERAZINE MALEATE 10 MG PO TABS
ORAL_TABLET | ORAL | Status: AC
  Filled 2020-02-12: qty 1

## 2020-02-12 MED ORDER — PROCHLORPERAZINE MALEATE 10 MG PO TABS
10.0000 mg | ORAL_TABLET | Freq: Once | ORAL | Status: AC
  Administered 2020-02-12: 10 mg via ORAL

## 2020-02-12 NOTE — Patient Instructions (Signed)
[facility] Discharge Instructions for Patients Receiving Chemotherapy  Today you received the following chemotherapy agents Velcade.  To help prevent nausea and vomiting after your treatment, we encourage you to take your nausea medication as directed.  If you develop nausea and vomiting that is not controlled by your nausea medication, call the clinic.   BELOW ARE SYMPTOMS THAT SHOULD BE REPORTED IMMEDIATELY:  *FEVER GREATER THAN 100.5 F  *CHILLS WITH OR WITHOUT FEVER  NAUSEA AND VOMITING THAT IS NOT CONTROLLED WITH YOUR NAUSEA MEDICATION  *UNUSUAL SHORTNESS OF BREATH  *UNUSUAL BRUISING OR BLEEDING  TENDERNESS IN MOUTH AND THROAT WITH OR WITHOUT PRESENCE OF ULCERS  *URINARY PROBLEMS  *BOWEL PROBLEMS  UNUSUAL RASH Items with * indicate a potential emergency and should be followed up as soon as possible.  Feel free to call the clinic should you have any questions or concerns. The clinic phone number is [phone].  Please show the CHEMO ALERT CARD at check-in to the Emergency Department and triage nurse.   

## 2020-02-15 DIAGNOSIS — L0390 Cellulitis, unspecified: Secondary | ICD-10-CM | POA: Diagnosis not present

## 2020-02-18 ENCOUNTER — Inpatient Hospital Stay: Payer: Medicare HMO

## 2020-02-18 ENCOUNTER — Other Ambulatory Visit: Payer: Self-pay

## 2020-02-18 VITALS — BP 122/57 | HR 70 | Temp 98.9°F | Resp 18 | Wt 231.5 lb

## 2020-02-18 DIAGNOSIS — C9000 Multiple myeloma not having achieved remission: Secondary | ICD-10-CM

## 2020-02-18 DIAGNOSIS — R5383 Other fatigue: Secondary | ICD-10-CM | POA: Diagnosis not present

## 2020-02-18 DIAGNOSIS — Z7952 Long term (current) use of systemic steroids: Secondary | ICD-10-CM | POA: Diagnosis not present

## 2020-02-18 DIAGNOSIS — Z5112 Encounter for antineoplastic immunotherapy: Secondary | ICD-10-CM | POA: Diagnosis not present

## 2020-02-18 DIAGNOSIS — M79642 Pain in left hand: Secondary | ICD-10-CM | POA: Diagnosis not present

## 2020-02-18 DIAGNOSIS — R531 Weakness: Secondary | ICD-10-CM | POA: Diagnosis not present

## 2020-02-18 LAB — CBC WITH DIFFERENTIAL (CANCER CENTER ONLY)
Abs Immature Granulocytes: 0.52 10*3/uL — ABNORMAL HIGH (ref 0.00–0.07)
Basophils Absolute: 0 10*3/uL (ref 0.0–0.1)
Basophils Relative: 0 %
Eosinophils Absolute: 0 10*3/uL (ref 0.0–0.5)
Eosinophils Relative: 0 %
HCT: 38.2 % — ABNORMAL LOW (ref 39.0–52.0)
Hemoglobin: 13.2 g/dL (ref 13.0–17.0)
Immature Granulocytes: 5 %
Lymphocytes Relative: 2 %
Lymphs Abs: 0.3 10*3/uL — ABNORMAL LOW (ref 0.7–4.0)
MCH: 34.1 pg — ABNORMAL HIGH (ref 26.0–34.0)
MCHC: 34.6 g/dL (ref 30.0–36.0)
MCV: 98.7 fL (ref 80.0–100.0)
Monocytes Absolute: 0.2 10*3/uL (ref 0.1–1.0)
Monocytes Relative: 2 %
Neutro Abs: 9.3 10*3/uL — ABNORMAL HIGH (ref 1.7–7.7)
Neutrophils Relative %: 91 %
Platelet Count: 137 10*3/uL — ABNORMAL LOW (ref 150–400)
RBC: 3.87 MIL/uL — ABNORMAL LOW (ref 4.22–5.81)
RDW: 12.3 % (ref 11.5–15.5)
WBC Count: 10.4 10*3/uL (ref 4.0–10.5)
nRBC: 0.3 % — ABNORMAL HIGH (ref 0.0–0.2)

## 2020-02-18 LAB — CMP (CANCER CENTER ONLY)
ALT: 26 U/L (ref 0–44)
AST: 18 U/L (ref 15–41)
Albumin: 3.3 g/dL — ABNORMAL LOW (ref 3.5–5.0)
Alkaline Phosphatase: 79 U/L (ref 38–126)
Anion gap: 13 (ref 5–15)
BUN: 16 mg/dL (ref 8–23)
CO2: 18 mmol/L — ABNORMAL LOW (ref 22–32)
Calcium: 9.6 mg/dL (ref 8.9–10.3)
Chloride: 102 mmol/L (ref 98–111)
Creatinine: 0.75 mg/dL (ref 0.61–1.24)
GFR, Est AFR Am: >60 mL/min (ref >60)
GFR, Estimated: >60 mL/min (ref >60)
Glucose, Bld: 151 mg/dL — ABNORMAL HIGH (ref 70–99)
Potassium: 4.5 mmol/L (ref 3.5–5.1)
Sodium: 133 mmol/L — ABNORMAL LOW (ref 135–145)
Total Bilirubin: 1 mg/dL (ref 0.3–1.2)
Total Protein: 6.6 g/dL (ref 6.5–8.1)

## 2020-02-18 MED ORDER — PROCHLORPERAZINE MALEATE 10 MG PO TABS
ORAL_TABLET | ORAL | Status: AC
  Filled 2020-02-18: qty 1

## 2020-02-18 MED ORDER — PROCHLORPERAZINE MALEATE 10 MG PO TABS
10.0000 mg | ORAL_TABLET | Freq: Once | ORAL | Status: AC
  Administered 2020-02-18: 10 mg via ORAL

## 2020-02-18 MED ORDER — BORTEZOMIB CHEMO SQ INJECTION 3.5 MG (2.5MG/ML)
1.3000 mg/m2 | Freq: Once | INTRAMUSCULAR | Status: AC
  Administered 2020-02-18: 3 mg via SUBCUTANEOUS
  Filled 2020-02-18: qty 1.2

## 2020-02-18 NOTE — Patient Instructions (Signed)
[facility] Discharge Instructions for Patients Receiving Chemotherapy  Today you received the following chemotherapy agents Velcade.  To help prevent nausea and vomiting after your treatment, we encourage you to take your nausea medication as directed.  If you develop nausea and vomiting that is not controlled by your nausea medication, call the clinic.   BELOW ARE SYMPTOMS THAT SHOULD BE REPORTED IMMEDIATELY:  *FEVER GREATER THAN 100.5 F  *CHILLS WITH OR WITHOUT FEVER  NAUSEA AND VOMITING THAT IS NOT CONTROLLED WITH YOUR NAUSEA MEDICATION  *UNUSUAL SHORTNESS OF BREATH  *UNUSUAL BRUISING OR BLEEDING  TENDERNESS IN MOUTH AND THROAT WITH OR WITHOUT PRESENCE OF ULCERS  *URINARY PROBLEMS  *BOWEL PROBLEMS  UNUSUAL RASH Items with * indicate a potential emergency and should be followed up as soon as possible.  Feel free to call the clinic should you have any questions or concerns. The clinic phone number is [phone].  Please show the CHEMO ALERT CARD at check-in to the Emergency Department and triage nurse.   

## 2020-02-19 ENCOUNTER — Telehealth: Payer: Self-pay | Admitting: Medical Oncology

## 2020-02-19 ENCOUNTER — Other Ambulatory Visit: Payer: Self-pay | Admitting: Medical Oncology

## 2020-02-19 DIAGNOSIS — C9000 Multiple myeloma not having achieved remission: Secondary | ICD-10-CM

## 2020-02-19 MED ORDER — LENALIDOMIDE 25 MG PO CAPS: 25.0000 mg | ORAL_CAPSULE | Freq: Every day | ORAL | 0 refills | Status: DC

## 2020-02-19 NOTE — Telephone Encounter (Signed)
L hand up to wrist- no grip , no strength, can't pick up anything. He can move all fingers and thumbs. Started Monday.   He is taking his xarelto.  Per Julien Nordmann I instructed pt to contact Dr Dagmar Hait and  to call 911 if he has  sudden numbness or weakness in face, arm , leg, on one side of body,trouble walking , trouble speaking.

## 2020-02-20 ENCOUNTER — Telehealth: Payer: Self-pay | Admitting: Medical Oncology

## 2020-02-20 NOTE — Telephone Encounter (Signed)
Faxed recent notes to Dr Dagmar Hait.

## 2020-02-24 DIAGNOSIS — S91302A Unspecified open wound, left foot, initial encounter: Secondary | ICD-10-CM | POA: Diagnosis not present

## 2020-02-24 DIAGNOSIS — R202 Paresthesia of skin: Secondary | ICD-10-CM | POA: Diagnosis not present

## 2020-02-24 DIAGNOSIS — R29898 Other symptoms and signs involving the musculoskeletal system: Secondary | ICD-10-CM | POA: Diagnosis not present

## 2020-02-25 ENCOUNTER — Encounter: Payer: Self-pay | Admitting: Internal Medicine

## 2020-02-25 ENCOUNTER — Other Ambulatory Visit: Payer: Self-pay

## 2020-02-25 ENCOUNTER — Inpatient Hospital Stay: Payer: Medicare HMO

## 2020-02-25 ENCOUNTER — Inpatient Hospital Stay: Payer: Medicare HMO | Admitting: Internal Medicine

## 2020-02-25 VITALS — BP 112/54 | HR 70 | Temp 97.5°F | Resp 17 | Ht 71.0 in | Wt 231.4 lb

## 2020-02-25 DIAGNOSIS — M79642 Pain in left hand: Secondary | ICD-10-CM | POA: Diagnosis not present

## 2020-02-25 DIAGNOSIS — D472 Monoclonal gammopathy: Secondary | ICD-10-CM

## 2020-02-25 DIAGNOSIS — C9000 Multiple myeloma not having achieved remission: Secondary | ICD-10-CM

## 2020-02-25 DIAGNOSIS — Z5112 Encounter for antineoplastic immunotherapy: Secondary | ICD-10-CM | POA: Diagnosis not present

## 2020-02-25 DIAGNOSIS — R5383 Other fatigue: Secondary | ICD-10-CM | POA: Diagnosis not present

## 2020-02-25 DIAGNOSIS — Z5111 Encounter for antineoplastic chemotherapy: Secondary | ICD-10-CM | POA: Diagnosis not present

## 2020-02-25 DIAGNOSIS — R531 Weakness: Secondary | ICD-10-CM | POA: Diagnosis not present

## 2020-02-25 DIAGNOSIS — Z7952 Long term (current) use of systemic steroids: Secondary | ICD-10-CM | POA: Diagnosis not present

## 2020-02-25 LAB — CMP (CANCER CENTER ONLY)
ALT: 26 U/L (ref 0–44)
AST: 22 U/L (ref 15–41)
Albumin: 3.6 g/dL (ref 3.5–5.0)
Alkaline Phosphatase: 80 U/L (ref 38–126)
Anion gap: 9 (ref 5–15)
BUN: 16 mg/dL (ref 8–23)
CO2: 19 mmol/L — ABNORMAL LOW (ref 22–32)
Calcium: 9.2 mg/dL (ref 8.9–10.3)
Chloride: 105 mmol/L (ref 98–111)
Creatinine: 0.76 mg/dL (ref 0.61–1.24)
GFR, Est AFR Am: >60 mL/min (ref >60)
GFR, Estimated: >60 mL/min (ref >60)
Glucose, Bld: 170 mg/dL — ABNORMAL HIGH (ref 70–99)
Potassium: 4.6 mmol/L (ref 3.5–5.1)
Sodium: 133 mmol/L — ABNORMAL LOW (ref 135–145)
Total Bilirubin: 1.2 mg/dL (ref 0.3–1.2)
Total Protein: 6.7 g/dL (ref 6.5–8.1)

## 2020-02-25 LAB — CBC WITH DIFFERENTIAL (CANCER CENTER ONLY)
Abs Immature Granulocytes: 0.14 10*3/uL — ABNORMAL HIGH (ref 0.00–0.07)
Basophils Absolute: 0 10*3/uL (ref 0.0–0.1)
Basophils Relative: 0 %
Eosinophils Absolute: 0 10*3/uL (ref 0.0–0.5)
Eosinophils Relative: 0 %
HCT: 36.7 % — ABNORMAL LOW (ref 39.0–52.0)
Hemoglobin: 12.9 g/dL — ABNORMAL LOW (ref 13.0–17.0)
Immature Granulocytes: 2 %
Lymphocytes Relative: 3 %
Lymphs Abs: 0.2 10*3/uL — ABNORMAL LOW (ref 0.7–4.0)
MCH: 34.4 pg — ABNORMAL HIGH (ref 26.0–34.0)
MCHC: 35.1 g/dL (ref 30.0–36.0)
MCV: 97.9 fL (ref 80.0–100.0)
Monocytes Absolute: 0.1 10*3/uL (ref 0.1–1.0)
Monocytes Relative: 2 %
Neutro Abs: 6.6 10*3/uL (ref 1.7–7.7)
Neutrophils Relative %: 93 %
Platelet Count: 137 10*3/uL — ABNORMAL LOW (ref 150–400)
RBC: 3.75 MIL/uL — ABNORMAL LOW (ref 4.22–5.81)
RDW: 12.4 % (ref 11.5–15.5)
WBC Count: 7.1 10*3/uL (ref 4.0–10.5)
nRBC: 0.3 % — ABNORMAL HIGH (ref 0.0–0.2)

## 2020-02-25 MED ORDER — PROCHLORPERAZINE MALEATE 10 MG PO TABS
10.0000 mg | ORAL_TABLET | Freq: Once | ORAL | Status: AC
  Administered 2020-02-25: 10 mg via ORAL

## 2020-02-25 MED ORDER — BORTEZOMIB CHEMO SQ INJECTION 3.5 MG (2.5MG/ML)
1.3000 mg/m2 | Freq: Once | INTRAMUSCULAR | Status: AC
  Administered 2020-02-25: 3 mg via SUBCUTANEOUS
  Filled 2020-02-25: qty 1.2

## 2020-02-25 MED ORDER — PROCHLORPERAZINE MALEATE 10 MG PO TABS
ORAL_TABLET | ORAL | Status: AC
  Filled 2020-02-25: qty 1

## 2020-02-25 NOTE — Progress Notes (Signed)
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 daily for 14 days every 3 weeks in addition to weekly Decadron 40 mg orally.  Status post 2 cycles.  INTERVAL HISTORY: [name] 79 y.o. male returns to the clinic today for follow-up visit.  The patient is feeling fine today with no concerning complaints except for aching pain in the left hand.  He denied having any current chest pain, shortness of breath, cough or hemoptysis.  He denied having any fever or chills.  He has no nausea, vomiting, diarrhea or constipation.  He denied having any headache or visual changes.  He tolerated the last 2 cycles of his treatment fairly well.  He is here today for evaluation before starting cycle #3.  MEDICAL HISTORY: Past Medical History:  Diagnosis Date  . Atrial flutter ([place])    A. s/p prior ablation;  B.  s/p CTI ablation 10/24/10 (Dr. Caryl Comes)  . CAD (coronary artery disease)    A.  s/p CFX in the past;   B.  cath 06/2010: LAD 30-40%, D1 50%, OM1 50%, AVCFX stent 30%, dCFX 70-80% (med Rx), mRCA 40%;      C.  Echo 06/2010: EF 60-65%, mod LVH; mild LAE     . Complete heart block ([facility])    a. s/p STJ pacemaker  . Cubital tunnel syndrome   . Degenerative disc disease   . Heart block   . HOH (hard of hearing)   . Hx of adenomatous colonic polyps   . Hyperlipidemia   . Hypertension   . Multiple myeloma   . Persistent atrial fibrillation ([place])   . Sleep apnea   . Venous insufficiency     ALLERGIES:  is allergic to bee venom.  MEDICATIONS:  Current Outpatient Medications  Medication Sig Dispense Refill  . acyclovir (ZOVIRAX) 200 MG capsule Take 1 capsule (200 mg total) by mouth 2 (two) times  daily. 60 capsule 2  . atorvastatin (LIPITOR) 10 MG tablet Take 10 mg by mouth daily at 6 PM.     . dexamethasone (DECADRON) 4 MG tablet 10 tablet p.o. daily weekly start with the first day of the chemotherapy. 40 tablet 3  . EPINEPHrine 0.3 mg/0.3 mL IJ SOAJ injection Inject 0.3 mg into the muscle once as needed for anaphylaxis.    . finasteride (PROSCAR) 5 MG tablet Take 5 mg by mouth daily.    . furosemide (LASIX) 40 MG tablet Take 1 tablet by mouth once daily (Patient taking differently: Take 40 mg by mouth daily. ) 30 tablet 6  . ketotifen (ALAWAY) 0.025 % ophthalmic solution Place 1 drop into both eyes 2 (two) times daily as needed (for dry eyes).     Marland Kitchen lenalidomide (REVLIMID) 25 MG capsule Take 1 capsule (25 mg total) by mouth daily. Take for 14 days, then hold for 7 days. Repeat every 21 days. 14 capsule 0  . losartan (COZAAR) 100 MG tablet Take 100 mg by mouth daily.    . Magnesium 200 MG TABS Take 1 tablet (200 mg total) by mouth daily. 30 each   . Multiple Vitamin (MULTIVITAMIN) capsule Take 1 capsule by mouth daily.      . nitroGLYCERIN (NITROSTAT) 0.4 MG SL tablet  PLACE ONE TABLET UNDER THE TONGUE EVERY 5 MINUTES AS NEEDED FOR CHEST PAIN (Patient not taking: No sig reported) 75 tablet 0  . Omega-3 Fatty Acids (FISH OIL) 1200 MG CAPS Take 1,200 mg by mouth daily.     . Potassium Chloride ER 20 MEQ TBCR Take 1 tablet by mouth once daily 90 tablet 3  . prochlorperazine (COMPAZINE) 10 MG tablet Take 1 tablet (10 mg total) by mouth every 6 (six) hours as needed for nausea or vomiting. 30 tablet 0  . thiamine 100 MG tablet Take 100 mg by mouth daily.    . Vitamins A & D (VITAMIN A & D) ointment Apply 1 application topically as needed for dry skin.    Marland Kitchen XARELTO 20 MG TABS tablet TAKE 1 TABLET BY MOUTH ONCE DAILY WITH SUPPER 90 tablet 1   No current facility-administered medications for this visit.    SURGICAL HISTORY:  Past Surgical History:  Procedure Laterality Date  . CARDIOVERSION  N/A 04/20/2018   Procedure: CARDIOVERSION;  Surgeon: Skeet Latch, MD;  Location: [facility];  Service: Cardiovascular;  Laterality: N/A;  . COLONOSCOPY  10-07-2009   TA polyp, tics, hems   . CORONARY ANGIOPLASTY WITH STENT PLACEMENT  2005  . PACEMAKER PLACEMENT  2011  . POLYPECTOMY    . PPM GENERATOR CHANGEOUT N/A 09/27/2019   Procedure: PPM GENERATOR CHANGEOUT;  Surgeon: Deboraha Sprang, MD;  Location: [facility];  Service: Cardiovascular;  Laterality: N/A;  . Right olecranon nerve surgery    . VENTRAL HERNIA REPAIR      REVIEW OF SYSTEMS:  A comprehensive review of systems was negative except for: Constitutional: positive for fatigue Musculoskeletal: positive for arthralgias   PHYSICAL EXAMINATION: General appearance: alert, cooperative, fatigued and no distress Head: Normocephalic, without obvious abnormality, atraumatic Neck: no adenopathy, no JVD, supple, symmetrical, trachea midline and thyroid not enlarged, symmetric, no tenderness/mass/nodules Lymph nodes: Cervical, supraclavicular, and axillary nodes normal. Resp: clear to auscultation bilaterally Back: symmetric, no curvature. ROM normal. No CVA tenderness. Cardio: regular rate and rhythm, S1, S2 normal, no murmur, click, rub or gallop GI: soft, non-tender; bowel sounds normal; no masses,  no organomegaly Extremities: extremities normal, atraumatic, no cyanosis or edema  ECOG PERFORMANCE STATUS: 1 - Symptomatic but completely ambulatory  Blood pressure (!) 112/54, pulse 70, temperature (!) 97.5 F (36.4 C), temperature source Temporal, resp. rate 17, height 5' 11"  (1.803 m), weight 231 lb 6.4 oz (105 kg), SpO2 98 %.  LABORATORY DATA: Lab Results  Component Value Date   WBC 7.1 02/25/2020   HGB 12.9 (L) 02/25/2020   HCT 36.7 (L) 02/25/2020   MCV 97.9 02/25/2020   PLT 137 (L) 02/25/2020      Chemistry      Component Value Date/Time   NA 133 (L) 02/18/2020 0833   NA 134 09/24/2019 0904   NA 136  06/06/2017 1058   K 4.5 02/18/2020 0833   K 4.0 06/06/2017 1058   CL 102 02/18/2020 0833   CL 97 (L) 12/03/2012 1015   CO2 18 (L) 02/18/2020 0833   CO2 24 06/06/2017 1058   BUN 16 02/18/2020 0833   BUN 8 09/24/2019 0904   BUN 5.6 (L) 06/06/2017 1058   CREATININE 0.75 02/18/2020 0833   CREATININE 0.8 06/06/2017 1058      Component Value Date/Time   CALCIUM 9.6 02/18/2020 0833   CALCIUM 9.1 06/06/2017 1058   ALKPHOS 79 02/18/2020 0833   ALKPHOS 61 06/06/2017 1058   AST  18 02/18/2020 0833   AST 36 (H) 06/06/2017 1058   ALT 26 02/18/2020 0833   ALT 23 06/06/2017 1058   BILITOT 1.0 02/18/2020 0833   BILITOT 0.46 06/06/2017 1058       RADIOGRAPHIC STUDIES: No results found.  ASSESSMENT AND PLAN:  This is a very pleasant 79 years old white male with history of smoldering multiple myeloma diagnosed in August 2011 and has been observation since that time. The patient is doing fine today with no concerning complaints except for increasing fatigue and weakness. His myeloma panel showed continued increase in IgM as well as free lambda light chain. He was given the option of observation versus treatment with weekly subcutaneous Velcade, Revlimid and Decadron and the patient decided to proceed with the treatment.  Our plan is to treat him for 4 cycles and then return back to observation. The patient is status post 2 cycles and he is tolerating the treatment well. I recommended for him to proceed with cycle #3 today as planned. I will see him back for follow-up visit in 3 weeks before the last cycle of his treatment. He was advised to call immediately if he has any concerning symptoms in the interval. The patient voices understanding of current disease status and treatment options and is in agreement with the current care plan. All questions were answered. The patient knows to call the clinic with any problems, questions or concerns. We can certainly see the patient much sooner if  necessary.   Disclaimer: This note was dictated with voice recognition software. Similar sounding words can inadvertently be transcribed and may not be corrected upon review.

## 2020-02-25 NOTE — Patient Instructions (Signed)
[facility] Discharge Instructions for Patients Receiving Chemotherapy  Today you received the following chemotherapy agents Bortezomib (VELCADE).  To help prevent nausea and vomiting after your treatment, we encourage you to take your nausea medication as prescribed.   If you develop nausea and vomiting that is not controlled by your nausea medication, call the clinic.   BELOW ARE SYMPTOMS THAT SHOULD BE REPORTED IMMEDIATELY:  *FEVER GREATER THAN 100.5 F  *CHILLS WITH OR WITHOUT FEVER  NAUSEA AND VOMITING THAT IS NOT CONTROLLED WITH YOUR NAUSEA MEDICATION  *UNUSUAL SHORTNESS OF BREATH  *UNUSUAL BRUISING OR BLEEDING  TENDERNESS IN MOUTH AND THROAT WITH OR WITHOUT PRESENCE OF ULCERS  *URINARY PROBLEMS  *BOWEL PROBLEMS  UNUSUAL RASH Items with * indicate a potential emergency and should be followed up as soon as possible.  Feel free to call the clinic should you have any questions or concerns. The clinic phone number is [phone].  Please show the CHEMO ALERT CARD at check-in to the Emergency Department and triage nurse.   

## 2020-02-27 DIAGNOSIS — N50811 Right testicular pain: Secondary | ICD-10-CM | POA: Diagnosis not present

## 2020-02-27 DIAGNOSIS — R350 Frequency of micturition: Secondary | ICD-10-CM | POA: Diagnosis not present

## 2020-02-27 DIAGNOSIS — N50812 Left testicular pain: Secondary | ICD-10-CM | POA: Diagnosis not present

## 2020-02-27 DIAGNOSIS — N401 Enlarged prostate with lower urinary tract symptoms: Secondary | ICD-10-CM | POA: Diagnosis not present

## 2020-02-27 DIAGNOSIS — R310 Gross hematuria: Secondary | ICD-10-CM | POA: Diagnosis not present

## 2020-03-03 ENCOUNTER — Inpatient Hospital Stay: Payer: Medicare HMO

## 2020-03-03 ENCOUNTER — Other Ambulatory Visit: Payer: Self-pay

## 2020-03-03 VITALS — BP 117/73 | HR 96 | Temp 97.8°F | Resp 18 | Ht 71.0 in | Wt 230.0 lb

## 2020-03-03 DIAGNOSIS — C9000 Multiple myeloma not having achieved remission: Secondary | ICD-10-CM

## 2020-03-03 DIAGNOSIS — Z7952 Long term (current) use of systemic steroids: Secondary | ICD-10-CM | POA: Diagnosis not present

## 2020-03-03 DIAGNOSIS — Z5112 Encounter for antineoplastic immunotherapy: Secondary | ICD-10-CM | POA: Diagnosis not present

## 2020-03-03 DIAGNOSIS — R5383 Other fatigue: Secondary | ICD-10-CM | POA: Diagnosis not present

## 2020-03-03 DIAGNOSIS — M79642 Pain in left hand: Secondary | ICD-10-CM | POA: Diagnosis not present

## 2020-03-03 DIAGNOSIS — R531 Weakness: Secondary | ICD-10-CM | POA: Diagnosis not present

## 2020-03-03 LAB — CMP (CANCER CENTER ONLY)
ALT: 27 U/L (ref 0–44)
AST: 26 U/L (ref 15–41)
Albumin: 3.4 g/dL — ABNORMAL LOW (ref 3.5–5.0)
Alkaline Phosphatase: 52 U/L (ref 38–126)
Anion gap: 10 (ref 5–15)
BUN: 14 mg/dL (ref 8–23)
CO2: 21 mmol/L — ABNORMAL LOW (ref 22–32)
Calcium: 9 mg/dL (ref 8.9–10.3)
Chloride: 102 mmol/L (ref 98–111)
Creatinine: 0.83 mg/dL (ref 0.61–1.24)
GFR, Est AFR Am: >60 mL/min (ref >60)
GFR, Estimated: >60 mL/min (ref >60)
Glucose, Bld: 96 mg/dL (ref 70–99)
Potassium: 4.1 mmol/L (ref 3.5–5.1)
Sodium: 133 mmol/L — ABNORMAL LOW (ref 135–145)
Total Bilirubin: 1.9 mg/dL — ABNORMAL HIGH (ref 0.3–1.2)
Total Protein: 6.3 g/dL — ABNORMAL LOW (ref 6.5–8.1)

## 2020-03-03 LAB — CBC WITH DIFFERENTIAL (CANCER CENTER ONLY)
Abs Immature Granulocytes: 0.41 10*3/uL — ABNORMAL HIGH (ref 0.00–0.07)
Basophils Absolute: 0 10*3/uL (ref 0.0–0.1)
Basophils Relative: 1 %
Eosinophils Absolute: 0 10*3/uL (ref 0.0–0.5)
Eosinophils Relative: 1 %
HCT: 36.7 % — ABNORMAL LOW (ref 39.0–52.0)
Hemoglobin: 12.8 g/dL — ABNORMAL LOW (ref 13.0–17.0)
Immature Granulocytes: 8 %
Lymphocytes Relative: 7 %
Lymphs Abs: 0.4 10*3/uL — ABNORMAL LOW (ref 0.7–4.0)
MCH: 34.1 pg — ABNORMAL HIGH (ref 26.0–34.0)
MCHC: 34.9 g/dL (ref 30.0–36.0)
MCV: 97.9 fL (ref 80.0–100.0)
Monocytes Absolute: 0.3 10*3/uL (ref 0.1–1.0)
Monocytes Relative: 6 %
Neutro Abs: 4.3 10*3/uL (ref 1.7–7.7)
Neutrophils Relative %: 77 %
Platelet Count: 103 10*3/uL — ABNORMAL LOW (ref 150–400)
RBC: 3.75 MIL/uL — ABNORMAL LOW (ref 4.22–5.81)
RDW: 13.2 % (ref 11.5–15.5)
WBC Count: 5.5 10*3/uL (ref 4.0–10.5)
nRBC: 0 % (ref 0.0–0.2)

## 2020-03-03 MED ORDER — PROCHLORPERAZINE MALEATE 10 MG PO TABS
10.0000 mg | ORAL_TABLET | Freq: Once | ORAL | Status: AC
  Administered 2020-03-03: 10 mg via ORAL

## 2020-03-03 MED ORDER — BORTEZOMIB CHEMO SQ INJECTION 3.5 MG (2.5MG/ML)
1.3000 mg/m2 | Freq: Once | INTRAMUSCULAR | Status: AC
  Administered 2020-03-03: 3 mg via SUBCUTANEOUS
  Filled 2020-03-03: qty 1.2

## 2020-03-03 MED ORDER — PROCHLORPERAZINE MALEATE 10 MG PO TABS
ORAL_TABLET | ORAL | Status: AC
  Filled 2020-03-03: qty 1

## 2020-03-03 NOTE — Patient Instructions (Signed)
[facility] Discharge Instructions for Patients Receiving Chemotherapy  Today you received the following chemotherapy agents Bortezomib (VELCADE).  To help prevent nausea and vomiting after your treatment, we encourage you to take your nausea medication as prescribed.   If you develop nausea and vomiting that is not controlled by your nausea medication, call the clinic.   BELOW ARE SYMPTOMS THAT SHOULD BE REPORTED IMMEDIATELY:  *FEVER GREATER THAN 100.5 F  *CHILLS WITH OR WITHOUT FEVER  NAUSEA AND VOMITING THAT IS NOT CONTROLLED WITH YOUR NAUSEA MEDICATION  *UNUSUAL SHORTNESS OF BREATH  *UNUSUAL BRUISING OR BLEEDING  TENDERNESS IN MOUTH AND THROAT WITH OR WITHOUT PRESENCE OF ULCERS  *URINARY PROBLEMS  *BOWEL PROBLEMS  UNUSUAL RASH Items with * indicate a potential emergency and should be followed up as soon as possible.  Feel free to call the clinic should you have any questions or concerns. The clinic phone number is [phone].  Please show the CHEMO ALERT CARD at check-in to the Emergency Department and triage nurse.   

## 2020-03-10 ENCOUNTER — Other Ambulatory Visit: Payer: Self-pay

## 2020-03-10 ENCOUNTER — Inpatient Hospital Stay: Payer: Medicare HMO

## 2020-03-10 VITALS — BP 111/60 | HR 70 | Temp 98.1°F | Resp 18 | Wt 221.5 lb

## 2020-03-10 DIAGNOSIS — Z5112 Encounter for antineoplastic immunotherapy: Secondary | ICD-10-CM | POA: Diagnosis not present

## 2020-03-10 DIAGNOSIS — C9000 Multiple myeloma not having achieved remission: Secondary | ICD-10-CM

## 2020-03-10 DIAGNOSIS — Z7952 Long term (current) use of systemic steroids: Secondary | ICD-10-CM | POA: Diagnosis not present

## 2020-03-10 DIAGNOSIS — R531 Weakness: Secondary | ICD-10-CM | POA: Diagnosis not present

## 2020-03-10 DIAGNOSIS — M79642 Pain in left hand: Secondary | ICD-10-CM | POA: Diagnosis not present

## 2020-03-10 DIAGNOSIS — R5383 Other fatigue: Secondary | ICD-10-CM | POA: Diagnosis not present

## 2020-03-10 LAB — CBC WITH DIFFERENTIAL (CANCER CENTER ONLY)
Abs Immature Granulocytes: 0.43 10*3/uL — ABNORMAL HIGH (ref 0.00–0.07)
Basophils Absolute: 0.1 10*3/uL (ref 0.0–0.1)
Basophils Relative: 1 %
Eosinophils Absolute: 0.1 10*3/uL (ref 0.0–0.5)
Eosinophils Relative: 1 %
HCT: 34.2 % — ABNORMAL LOW (ref 39.0–52.0)
Hemoglobin: 11.7 g/dL — ABNORMAL LOW (ref 13.0–17.0)
Immature Granulocytes: 5 %
Lymphocytes Relative: 15 %
Lymphs Abs: 1.2 10*3/uL (ref 0.7–4.0)
MCH: 33.6 pg (ref 26.0–34.0)
MCHC: 34.2 g/dL (ref 30.0–36.0)
MCV: 98.3 fL (ref 80.0–100.0)
Monocytes Absolute: 0.4 10*3/uL (ref 0.1–1.0)
Monocytes Relative: 5 %
Neutro Abs: 6 10*3/uL (ref 1.7–7.7)
Neutrophils Relative %: 73 %
Platelet Count: 114 10*3/uL — ABNORMAL LOW (ref 150–400)
RBC: 3.48 MIL/uL — ABNORMAL LOW (ref 4.22–5.81)
RDW: 13 % (ref 11.5–15.5)
WBC Count: 8.5 10*3/uL (ref 4.0–10.5)
nRBC: 0.4 % — ABNORMAL HIGH (ref 0.0–0.2)

## 2020-03-10 LAB — CMP (CANCER CENTER ONLY)
ALT: 26 U/L (ref 0–44)
AST: 22 U/L (ref 15–41)
Albumin: 3.4 g/dL — ABNORMAL LOW (ref 3.5–5.0)
Alkaline Phosphatase: 49 U/L (ref 38–126)
Anion gap: 10 (ref 5–15)
BUN: 15 mg/dL (ref 8–23)
CO2: 21 mmol/L — ABNORMAL LOW (ref 22–32)
Calcium: 9.5 mg/dL (ref 8.9–10.3)
Chloride: 109 mmol/L (ref 98–111)
Creatinine: 0.75 mg/dL (ref 0.61–1.24)
GFR, Est AFR Am: >60 mL/min (ref >60)
GFR, Estimated: >60 mL/min (ref >60)
Glucose, Bld: 94 mg/dL (ref 70–99)
Potassium: 4.3 mmol/L (ref 3.5–5.1)
Sodium: 140 mmol/L (ref 135–145)
Total Bilirubin: 1.1 mg/dL (ref 0.3–1.2)
Total Protein: 5.9 g/dL — ABNORMAL LOW (ref 6.5–8.1)

## 2020-03-10 MED ORDER — PROCHLORPERAZINE MALEATE 10 MG PO TABS
10.0000 mg | ORAL_TABLET | Freq: Once | ORAL | Status: AC
  Administered 2020-03-10: 10 mg via ORAL

## 2020-03-10 MED ORDER — BORTEZOMIB CHEMO SQ INJECTION 3.5 MG (2.5MG/ML)
1.3000 mg/m2 | Freq: Once | INTRAMUSCULAR | Status: AC
  Administered 2020-03-10: 3 mg via SUBCUTANEOUS
  Filled 2020-03-10: qty 1.2

## 2020-03-10 MED ORDER — PROCHLORPERAZINE MALEATE 10 MG PO TABS
ORAL_TABLET | ORAL | Status: AC
  Filled 2020-03-10: qty 1

## 2020-03-10 NOTE — Progress Notes (Signed)
Per patient, took home dose of dexamethasone 40mg .

## 2020-03-10 NOTE — Patient Instructions (Signed)
[facility] Discharge Instructions for Patients Receiving Chemotherapy  Today you received the following chemotherapy agents Velcade.  To help prevent nausea and vomiting after your treatment, we encourage you to take your nausea medication as directed.  If you develop nausea and vomiting that is not controlled by your nausea medication, call the clinic.   BELOW ARE SYMPTOMS THAT SHOULD BE REPORTED IMMEDIATELY:  *FEVER GREATER THAN 100.5 F  *CHILLS WITH OR WITHOUT FEVER  NAUSEA AND VOMITING THAT IS NOT CONTROLLED WITH YOUR NAUSEA MEDICATION  *UNUSUAL SHORTNESS OF BREATH  *UNUSUAL BRUISING OR BLEEDING  TENDERNESS IN MOUTH AND THROAT WITH OR WITHOUT PRESENCE OF ULCERS  *URINARY PROBLEMS  *BOWEL PROBLEMS  UNUSUAL RASH Items with * indicate a potential emergency and should be followed up as soon as possible.  Feel free to call the clinic should you have any questions or concerns. The clinic phone number is [phone].  Please show the CHEMO ALERT CARD at check-in to the Emergency Department and triage nurse.   

## 2020-03-11 ENCOUNTER — Other Ambulatory Visit: Payer: Self-pay | Admitting: Internal Medicine

## 2020-03-11 DIAGNOSIS — C9000 Multiple myeloma not having achieved remission: Secondary | ICD-10-CM

## 2020-03-12 ENCOUNTER — Other Ambulatory Visit: Payer: Self-pay | Admitting: Medical Oncology

## 2020-03-12 DIAGNOSIS — C9000 Multiple myeloma not having achieved remission: Secondary | ICD-10-CM

## 2020-03-12 MED ORDER — LENALIDOMIDE 25 MG PO CAPS: ORAL_CAPSULE | ORAL | 0 refills | Status: DC

## 2020-03-15 ENCOUNTER — Inpatient Hospital Stay (HOSPITAL_COMMUNITY)
Admission: EM | Admit: 2020-03-15 | Discharge: 2020-03-27 | DRG: 871 | Disposition: A | Discharge status: Skilled Nursing Facility | Payer: Medicare HMO | Attending: Internal Medicine | Admitting: Internal Medicine

## 2020-03-15 ENCOUNTER — Emergency Department (HOSPITAL_COMMUNITY): Payer: Medicare HMO

## 2020-03-15 ENCOUNTER — Other Ambulatory Visit: Payer: Self-pay

## 2020-03-15 ENCOUNTER — Inpatient Hospital Stay (HOSPITAL_COMMUNITY): Payer: Medicare HMO

## 2020-03-15 ENCOUNTER — Encounter (HOSPITAL_COMMUNITY): Payer: Self-pay | Admitting: Emergency Medicine

## 2020-03-15 DIAGNOSIS — D472 Monoclonal gammopathy: Secondary | ICD-10-CM | POA: Diagnosis present

## 2020-03-15 DIAGNOSIS — A419 Sepsis, unspecified organism: Secondary | ICD-10-CM | POA: Diagnosis not present

## 2020-03-15 DIAGNOSIS — D84821 Immunodeficiency due to drugs: Secondary | ICD-10-CM | POA: Diagnosis not present

## 2020-03-15 DIAGNOSIS — W19XXXA Unspecified fall, initial encounter: Secondary | ICD-10-CM | POA: Diagnosis not present

## 2020-03-15 DIAGNOSIS — I700 Atherosclerosis of aorta: Secondary | ICD-10-CM | POA: Diagnosis not present

## 2020-03-15 DIAGNOSIS — R627 Adult failure to thrive: Secondary | ICD-10-CM | POA: Diagnosis present

## 2020-03-15 DIAGNOSIS — E872 Acidosis, unspecified: Secondary | ICD-10-CM | POA: Diagnosis present

## 2020-03-15 DIAGNOSIS — G9340 Encephalopathy, unspecified: Secondary | ICD-10-CM

## 2020-03-15 DIAGNOSIS — I4892 Unspecified atrial flutter: Secondary | ICD-10-CM | POA: Diagnosis not present

## 2020-03-15 DIAGNOSIS — D696 Thrombocytopenia, unspecified: Secondary | ICD-10-CM | POA: Diagnosis present

## 2020-03-15 DIAGNOSIS — J9601 Acute respiratory failure with hypoxia: Secondary | ICD-10-CM | POA: Diagnosis not present

## 2020-03-15 DIAGNOSIS — I4891 Unspecified atrial fibrillation: Secondary | ICD-10-CM | POA: Diagnosis present

## 2020-03-15 DIAGNOSIS — R062 Wheezing: Secondary | ICD-10-CM | POA: Diagnosis not present

## 2020-03-15 DIAGNOSIS — R296 Repeated falls: Secondary | ICD-10-CM | POA: Diagnosis present

## 2020-03-15 DIAGNOSIS — I739 Peripheral vascular disease, unspecified: Secondary | ICD-10-CM | POA: Diagnosis not present

## 2020-03-15 DIAGNOSIS — E876 Hypokalemia: Secondary | ICD-10-CM | POA: Diagnosis not present

## 2020-03-15 DIAGNOSIS — K529 Noninfective gastroenteritis and colitis, unspecified: Secondary | ICD-10-CM | POA: Diagnosis present

## 2020-03-15 DIAGNOSIS — J439 Emphysema, unspecified: Secondary | ICD-10-CM | POA: Diagnosis present

## 2020-03-15 DIAGNOSIS — Z7401 Bed confinement status: Secondary | ICD-10-CM | POA: Diagnosis not present

## 2020-03-15 DIAGNOSIS — Z7189 Other specified counseling: Secondary | ICD-10-CM | POA: Diagnosis not present

## 2020-03-15 DIAGNOSIS — C9000 Multiple myeloma not having achieved remission: Secondary | ICD-10-CM | POA: Diagnosis present

## 2020-03-15 DIAGNOSIS — Z66 Do not resuscitate: Secondary | ICD-10-CM | POA: Diagnosis not present

## 2020-03-15 DIAGNOSIS — E8809 Other disorders of plasma-protein metabolism, not elsewhere classified: Secondary | ICD-10-CM | POA: Diagnosis present

## 2020-03-15 DIAGNOSIS — Z79899 Other long term (current) drug therapy: Secondary | ICD-10-CM | POA: Diagnosis not present

## 2020-03-15 DIAGNOSIS — I480 Paroxysmal atrial fibrillation: Secondary | ICD-10-CM | POA: Diagnosis not present

## 2020-03-15 DIAGNOSIS — Z6832 Body mass index (BMI) 32.0-32.9, adult: Secondary | ICD-10-CM

## 2020-03-15 DIAGNOSIS — I361 Nonrheumatic tricuspid (valve) insufficiency: Secondary | ICD-10-CM | POA: Diagnosis not present

## 2020-03-15 DIAGNOSIS — I442 Atrioventricular block, complete: Secondary | ICD-10-CM | POA: Diagnosis not present

## 2020-03-15 DIAGNOSIS — J9811 Atelectasis: Secondary | ICD-10-CM | POA: Diagnosis not present

## 2020-03-15 DIAGNOSIS — I313 Pericardial effusion (noninflammatory): Secondary | ICD-10-CM | POA: Diagnosis not present

## 2020-03-15 DIAGNOSIS — R6520 Severe sepsis without septic shock: Secondary | ICD-10-CM | POA: Diagnosis not present

## 2020-03-15 DIAGNOSIS — K5730 Diverticulosis of large intestine without perforation or abscess without bleeding: Secondary | ICD-10-CM | POA: Diagnosis not present

## 2020-03-15 DIAGNOSIS — R17 Unspecified jaundice: Secondary | ICD-10-CM | POA: Diagnosis present

## 2020-03-15 DIAGNOSIS — Z8249 Family history of ischemic heart disease and other diseases of the circulatory system: Secondary | ICD-10-CM

## 2020-03-15 DIAGNOSIS — N390 Urinary tract infection, site not specified: Secondary | ICD-10-CM

## 2020-03-15 DIAGNOSIS — S8991XA Unspecified injury of right lower leg, initial encounter: Secondary | ICD-10-CM | POA: Diagnosis not present

## 2020-03-15 DIAGNOSIS — D638 Anemia in other chronic diseases classified elsewhere: Secondary | ICD-10-CM | POA: Diagnosis present

## 2020-03-15 DIAGNOSIS — I248 Other forms of acute ischemic heart disease: Secondary | ICD-10-CM | POA: Diagnosis present

## 2020-03-15 DIAGNOSIS — I482 Chronic atrial fibrillation, unspecified: Secondary | ICD-10-CM | POA: Diagnosis not present

## 2020-03-15 DIAGNOSIS — R2681 Unsteadiness on feet: Secondary | ICD-10-CM | POA: Diagnosis not present

## 2020-03-15 DIAGNOSIS — R519 Headache, unspecified: Secondary | ICD-10-CM | POA: Diagnosis not present

## 2020-03-15 DIAGNOSIS — I2510 Atherosclerotic heart disease of native coronary artery without angina pectoris: Secondary | ICD-10-CM | POA: Diagnosis present

## 2020-03-15 DIAGNOSIS — I471 Supraventricular tachycardia: Secondary | ICD-10-CM | POA: Diagnosis present

## 2020-03-15 DIAGNOSIS — R6521 Severe sepsis with septic shock: Secondary | ICD-10-CM | POA: Diagnosis present

## 2020-03-15 DIAGNOSIS — M25562 Pain in left knee: Secondary | ICD-10-CM | POA: Diagnosis not present

## 2020-03-15 DIAGNOSIS — I872 Venous insufficiency (chronic) (peripheral): Secondary | ICD-10-CM | POA: Diagnosis present

## 2020-03-15 DIAGNOSIS — I4819 Other persistent atrial fibrillation: Secondary | ICD-10-CM | POA: Diagnosis not present

## 2020-03-15 DIAGNOSIS — N281 Cyst of kidney, acquired: Secondary | ICD-10-CM | POA: Diagnosis not present

## 2020-03-15 DIAGNOSIS — R0902 Hypoxemia: Secondary | ICD-10-CM | POA: Diagnosis not present

## 2020-03-15 DIAGNOSIS — Z515 Encounter for palliative care: Secondary | ICD-10-CM | POA: Diagnosis not present

## 2020-03-15 DIAGNOSIS — R0689 Other abnormalities of breathing: Secondary | ICD-10-CM | POA: Diagnosis not present

## 2020-03-15 DIAGNOSIS — Z20822 Contact with and (suspected) exposure to covid-19: Secondary | ICD-10-CM | POA: Diagnosis not present

## 2020-03-15 DIAGNOSIS — S8992XA Unspecified injury of left lower leg, initial encounter: Secondary | ICD-10-CM | POA: Diagnosis not present

## 2020-03-15 DIAGNOSIS — R54 Age-related physical debility: Secondary | ICD-10-CM | POA: Diagnosis present

## 2020-03-15 DIAGNOSIS — R531 Weakness: Secondary | ICD-10-CM

## 2020-03-15 DIAGNOSIS — R1312 Dysphagia, oropharyngeal phase: Secondary | ICD-10-CM | POA: Diagnosis not present

## 2020-03-15 DIAGNOSIS — L89311 Pressure ulcer of right buttock, stage 1: Secondary | ICD-10-CM | POA: Diagnosis present

## 2020-03-15 DIAGNOSIS — L89321 Pressure ulcer of left buttock, stage 1: Secondary | ICD-10-CM | POA: Diagnosis present

## 2020-03-15 DIAGNOSIS — Z87892 Personal history of anaphylaxis: Secondary | ICD-10-CM

## 2020-03-15 DIAGNOSIS — G4733 Obstructive sleep apnea (adult) (pediatric): Secondary | ICD-10-CM | POA: Diagnosis present

## 2020-03-15 DIAGNOSIS — S299XXA Unspecified injury of thorax, initial encounter: Secondary | ICD-10-CM | POA: Diagnosis not present

## 2020-03-15 DIAGNOSIS — D6859 Other primary thrombophilia: Secondary | ICD-10-CM | POA: Diagnosis present

## 2020-03-15 DIAGNOSIS — N3289 Other specified disorders of bladder: Secondary | ICD-10-CM | POA: Diagnosis not present

## 2020-03-15 DIAGNOSIS — H109 Unspecified conjunctivitis: Secondary | ICD-10-CM | POA: Diagnosis not present

## 2020-03-15 DIAGNOSIS — Z950 Presence of cardiac pacemaker: Secondary | ICD-10-CM

## 2020-03-15 DIAGNOSIS — I959 Hypotension, unspecified: Secondary | ICD-10-CM | POA: Diagnosis not present

## 2020-03-15 DIAGNOSIS — S0003XA Contusion of scalp, initial encounter: Secondary | ICD-10-CM | POA: Diagnosis present

## 2020-03-15 DIAGNOSIS — E785 Hyperlipidemia, unspecified: Secondary | ICD-10-CM | POA: Diagnosis present

## 2020-03-15 DIAGNOSIS — E861 Hypovolemia: Secondary | ICD-10-CM | POA: Diagnosis present

## 2020-03-15 DIAGNOSIS — I10 Essential (primary) hypertension: Secondary | ICD-10-CM | POA: Diagnosis not present

## 2020-03-15 DIAGNOSIS — S79922A Unspecified injury of left thigh, initial encounter: Secondary | ICD-10-CM | POA: Diagnosis not present

## 2020-03-15 DIAGNOSIS — J9690 Respiratory failure, unspecified, unspecified whether with hypoxia or hypercapnia: Secondary | ICD-10-CM

## 2020-03-15 DIAGNOSIS — H9190 Unspecified hearing loss, unspecified ear: Secondary | ICD-10-CM | POA: Diagnosis present

## 2020-03-15 DIAGNOSIS — M6281 Muscle weakness (generalized): Secondary | ICD-10-CM | POA: Diagnosis not present

## 2020-03-15 DIAGNOSIS — Z87891 Personal history of nicotine dependence: Secondary | ICD-10-CM

## 2020-03-15 DIAGNOSIS — E669 Obesity, unspecified: Secondary | ICD-10-CM | POA: Diagnosis present

## 2020-03-15 DIAGNOSIS — Z7901 Long term (current) use of anticoagulants: Secondary | ICD-10-CM

## 2020-03-15 DIAGNOSIS — I672 Cerebral atherosclerosis: Secondary | ICD-10-CM | POA: Diagnosis not present

## 2020-03-15 DIAGNOSIS — J90 Pleural effusion, not elsewhere classified: Secondary | ICD-10-CM | POA: Diagnosis not present

## 2020-03-15 DIAGNOSIS — D6869 Other thrombophilia: Secondary | ICD-10-CM | POA: Diagnosis present

## 2020-03-15 DIAGNOSIS — Z955 Presence of coronary angioplasty implant and graft: Secondary | ICD-10-CM

## 2020-03-15 DIAGNOSIS — R7989 Other specified abnormal findings of blood chemistry: Secondary | ICD-10-CM | POA: Diagnosis present

## 2020-03-15 DIAGNOSIS — L8990 Pressure ulcer of unspecified site, unspecified stage: Secondary | ICD-10-CM | POA: Insufficient documentation

## 2020-03-15 DIAGNOSIS — M1711 Unilateral primary osteoarthritis, right knee: Secondary | ICD-10-CM | POA: Diagnosis not present

## 2020-03-15 DIAGNOSIS — I517 Cardiomegaly: Secondary | ICD-10-CM | POA: Diagnosis not present

## 2020-03-15 DIAGNOSIS — I708 Atherosclerosis of other arteries: Secondary | ICD-10-CM | POA: Diagnosis not present

## 2020-03-15 DIAGNOSIS — T451X5A Adverse effect of antineoplastic and immunosuppressive drugs, initial encounter: Secondary | ICD-10-CM | POA: Diagnosis present

## 2020-03-15 DIAGNOSIS — M2550 Pain in unspecified joint: Secondary | ICD-10-CM | POA: Diagnosis not present

## 2020-03-15 DIAGNOSIS — J8489 Other specified interstitial pulmonary diseases: Secondary | ICD-10-CM | POA: Diagnosis not present

## 2020-03-15 DIAGNOSIS — Z91030 Bee allergy status: Secondary | ICD-10-CM

## 2020-03-15 LAB — SARS CORONAVIRUS 2 BY RT PCR (HOSPITAL ORDER, PERFORMED IN ~~LOC~~ HOSPITAL LAB): SARS Coronavirus 2: NEGATIVE

## 2020-03-15 LAB — CBC
HCT: 33.2 % — ABNORMAL LOW (ref 39.0–52.0)
Hemoglobin: 11.2 g/dL — ABNORMAL LOW (ref 13.0–17.0)
MCH: 33.2 pg (ref 26.0–34.0)
MCHC: 33.7 g/dL (ref 30.0–36.0)
MCV: 98.5 fL (ref 80.0–100.0)
Platelets: 93 10*3/uL — ABNORMAL LOW (ref 150–400)
RBC: 3.37 MIL/uL — ABNORMAL LOW (ref 4.22–5.81)
RDW: 13.2 % (ref 11.5–15.5)
WBC: 9.9 10*3/uL (ref 4.0–10.5)
nRBC: 0.7 % — ABNORMAL HIGH (ref 0.0–0.2)

## 2020-03-15 LAB — HEPATIC FUNCTION PANEL
ALT: 20 U/L (ref 0–44)
AST: 20 U/L (ref 15–41)
Albumin: 3.2 g/dL — ABNORMAL LOW (ref 3.5–5.0)
Alkaline Phosphatase: 37 U/L — ABNORMAL LOW (ref 38–126)
Bilirubin, Direct: 0.3 mg/dL — ABNORMAL HIGH (ref 0.0–0.2)
Indirect Bilirubin: 1.2 mg/dL — ABNORMAL HIGH (ref 0.3–0.9)
Total Bilirubin: 1.5 mg/dL — ABNORMAL HIGH (ref 0.3–1.2)
Total Protein: 5.6 g/dL — ABNORMAL LOW (ref 6.5–8.1)

## 2020-03-15 LAB — TROPONIN I (HIGH SENSITIVITY)
Troponin I (High Sensitivity): 90 ng/L — ABNORMAL HIGH (ref <18)
Troponin I (High Sensitivity): 97 ng/L — ABNORMAL HIGH (ref <18)

## 2020-03-15 LAB — URINALYSIS, ROUTINE W REFLEX MICROSCOPIC
Bilirubin Urine: NEGATIVE
Glucose, UA: NEGATIVE mg/dL
Hgb urine dipstick: NEGATIVE
Ketones, ur: NEGATIVE mg/dL
Leukocytes,Ua: NEGATIVE
Nitrite: NEGATIVE
Protein, ur: NEGATIVE mg/dL
Specific Gravity, Urine: 1.014 (ref 1.005–1.030)
pH: 5 (ref 5.0–8.0)

## 2020-03-15 LAB — BRAIN NATRIURETIC PEPTIDE: B Natriuretic Peptide: 132.6 pg/mL — ABNORMAL HIGH (ref 0.0–100.0)

## 2020-03-15 LAB — BASIC METABOLIC PANEL
Anion gap: 9 (ref 5–15)
BUN: 22 mg/dL (ref 8–23)
CO2: 18 mmol/L — ABNORMAL LOW (ref 22–32)
Calcium: 8.4 mg/dL — ABNORMAL LOW (ref 8.9–10.3)
Chloride: 109 mmol/L (ref 98–111)
Creatinine, Ser: 0.98 mg/dL (ref 0.61–1.24)
GFR calc Af Amer: >60 mL/min (ref >60)
GFR calc non Af Amer: >60 mL/min (ref >60)
Glucose, Bld: 100 mg/dL — ABNORMAL HIGH (ref 70–99)
Potassium: 3.5 mmol/L (ref 3.5–5.1)
Sodium: 136 mmol/L (ref 135–145)

## 2020-03-15 LAB — PROTIME-INR
INR: 1.9 — ABNORMAL HIGH (ref 0.8–1.2)
Prothrombin Time: 21.1 seconds — ABNORMAL HIGH (ref 11.4–15.2)

## 2020-03-15 LAB — APTT: aPTT: 28 seconds (ref 24–36)

## 2020-03-15 LAB — PROCALCITONIN: Procalcitonin: 2.19 ng/mL

## 2020-03-15 LAB — CORTISOL-PM, BLOOD: Cortisol - PM: 17.6 ug/dL — ABNORMAL HIGH (ref <10.0)

## 2020-03-15 LAB — ETHANOL: Alcohol, Ethyl (B): <10 mg/dL (ref <10)

## 2020-03-15 LAB — TSH: TSH: 0.929 u[IU]/mL (ref 0.350–4.500)

## 2020-03-15 LAB — CBG MONITORING, ED: Glucose-Capillary: 91 mg/dL (ref 70–99)

## 2020-03-15 LAB — LACTIC ACID, PLASMA
Lactic Acid, Venous: 2.8 mmol/L (ref 0.5–1.9)
Lactic Acid, Venous: 2.2 mmol/L (ref 0.5–1.9)

## 2020-03-15 MED ORDER — ACYCLOVIR 200 MG PO CAPS
200.0000 mg | ORAL_CAPSULE | Freq: Two times a day (BID) | ORAL | Status: DC
  Administered 2020-03-15 – 2020-03-27 (×24): 200 mg via ORAL
  Filled 2020-03-15 (×25): qty 1

## 2020-03-15 MED ORDER — ACETAMINOPHEN 650 MG RE SUPP: 650.0000 mg | Freq: Four times a day (QID) | RECTAL | Status: DC | PRN

## 2020-03-15 MED ORDER — THIAMINE HCL 100 MG PO TABS
100.0000 mg | ORAL_TABLET | Freq: Every day | ORAL | Status: DC
  Administered 2020-03-15 – 2020-03-27 (×12): 100 mg via ORAL
  Filled 2020-03-15 (×13): qty 1

## 2020-03-15 MED ORDER — VANCOMYCIN HCL IN DEXTROSE 1-5 GM/200ML-% IV SOLN
1000.0000 mg | Freq: Once | INTRAVENOUS | Status: DC
  Filled 2020-03-15: qty 200

## 2020-03-15 MED ORDER — MAGNESIUM OXIDE 400 (241.3 MG) MG PO TABS
200.0000 mg | ORAL_TABLET | Freq: Every day | ORAL | Status: DC
  Administered 2020-03-15 – 2020-03-20 (×6): 200 mg via ORAL
  Filled 2020-03-15 (×6): qty 1

## 2020-03-15 MED ORDER — METRONIDAZOLE IN NACL 5-0.79 MG/ML-% IV SOLN
500.0000 mg | Freq: Once | INTRAVENOUS | Status: AC
  Administered 2020-03-15: 500 mg via INTRAVENOUS
  Filled 2020-03-15: qty 100

## 2020-03-15 MED ORDER — OMEGA-3-ACID ETHYL ESTERS 1 G PO CAPS
1.0000 g | ORAL_CAPSULE | Freq: Every day | ORAL | Status: DC
  Administered 2020-03-15 – 2020-03-27 (×13): 1 g via ORAL
  Filled 2020-03-15 (×13): qty 1

## 2020-03-15 MED ORDER — RIVAROXABAN 20 MG PO TABS
20.0000 mg | ORAL_TABLET | Freq: Every day | ORAL | Status: DC
  Administered 2020-03-15 – 2020-03-27 (×13): 20 mg via ORAL
  Filled 2020-03-15 (×14): qty 1

## 2020-03-15 MED ORDER — PROCHLORPERAZINE MALEATE 10 MG PO TABS
10.0000 mg | ORAL_TABLET | Freq: Four times a day (QID) | ORAL | Status: DC | PRN
  Filled 2020-03-15: qty 1

## 2020-03-15 MED ORDER — IOHEXOL 300 MG/ML  SOLN
125.0000 mL | Freq: Once | INTRAMUSCULAR | Status: AC | PRN
  Administered 2020-03-15: 125 mL via INTRAVENOUS

## 2020-03-15 MED ORDER — SODIUM CHLORIDE 0.9 % IV SOLN: INTRAVENOUS | Status: DC

## 2020-03-15 MED ORDER — VITAMINS A & D EX OINT
1.0000 "application " | TOPICAL_OINTMENT | CUTANEOUS | Status: DC | PRN
  Filled 2020-03-15: qty 56.7

## 2020-03-15 MED ORDER — VANCOMYCIN HCL 2000 MG/400ML IV SOLN
2000.0000 mg | Freq: Once | INTRAVENOUS | Status: AC
  Administered 2020-03-15: 2000 mg via INTRAVENOUS
  Filled 2020-03-15: qty 400

## 2020-03-15 MED ORDER — SODIUM CHLORIDE 0.9 % IV BOLUS
500.0000 mL | Freq: Once | INTRAVENOUS | Status: AC
  Administered 2020-03-15: 500 mL via INTRAVENOUS

## 2020-03-15 MED ORDER — LENALIDOMIDE 15 MG PO CAPS: 25.0000 mg | ORAL_CAPSULE | Freq: Every day | ORAL | Status: DC

## 2020-03-15 MED ORDER — VANCOMYCIN HCL 750 MG/150ML IV SOLN
750.0000 mg | Freq: Two times a day (BID) | INTRAVENOUS | Status: DC
  Administered 2020-03-16 – 2020-03-17 (×3): 750 mg via INTRAVENOUS
  Filled 2020-03-15 (×4): qty 150

## 2020-03-15 MED ORDER — LACTATED RINGERS IV BOLUS (SEPSIS)
1000.0000 mL | Freq: Once | INTRAVENOUS | Status: AC
  Administered 2020-03-15: 1000 mL via INTRAVENOUS

## 2020-03-15 MED ORDER — SODIUM CHLORIDE 0.9% FLUSH: 3.0000 mL | Freq: Once | INTRAVENOUS | Status: DC

## 2020-03-15 MED ORDER — ADULT MULTIVITAMIN W/MINERALS CH
1.0000 | ORAL_TABLET | Freq: Every day | ORAL | Status: DC
  Administered 2020-03-15 – 2020-03-27 (×13): 1 via ORAL
  Filled 2020-03-15 (×13): qty 1

## 2020-03-15 MED ORDER — ACETAMINOPHEN 325 MG PO TABS
650.0000 mg | ORAL_TABLET | Freq: Four times a day (QID) | ORAL | Status: DC | PRN
  Administered 2020-03-17 – 2020-03-20 (×2): 650 mg via ORAL
  Filled 2020-03-15 (×2): qty 2

## 2020-03-15 MED ORDER — SODIUM CHLORIDE 0.9 % IV SOLN
2.0000 g | Freq: Three times a day (TID) | INTRAVENOUS | Status: DC
  Administered 2020-03-15 – 2020-03-18 (×9): 2 g via INTRAVENOUS
  Filled 2020-03-15 (×9): qty 2

## 2020-03-15 MED ORDER — FINASTERIDE 5 MG PO TABS
5.0000 mg | ORAL_TABLET | Freq: Every day | ORAL | Status: DC
  Administered 2020-03-15 – 2020-03-27 (×13): 5 mg via ORAL
  Filled 2020-03-15 (×13): qty 1

## 2020-03-15 MED ORDER — SODIUM CHLORIDE 0.9 % IV SOLN
2.0000 g | Freq: Once | INTRAVENOUS | Status: AC
  Administered 2020-03-15: 2 g via INTRAVENOUS
  Filled 2020-03-15: qty 2

## 2020-03-15 MED ORDER — IOHEXOL 9 MG/ML PO SOLN
500.0000 mL | ORAL | Status: AC
  Administered 2020-03-15 (×2): 500 mL via ORAL

## 2020-03-15 MED ORDER — ATORVASTATIN CALCIUM 10 MG PO TABS
10.0000 mg | ORAL_TABLET | Freq: Every day | ORAL | Status: DC
  Administered 2020-03-15 – 2020-03-27 (×13): 10 mg via ORAL
  Filled 2020-03-15 (×13): qty 1

## 2020-03-15 MED ORDER — KETOTIFEN FUMARATE 0.025 % OP SOLN
1.0000 [drp] | Freq: Two times a day (BID) | OPHTHALMIC | Status: DC | PRN
  Administered 2020-03-18: 1 [drp] via OPHTHALMIC
  Filled 2020-03-15: qty 5

## 2020-03-15 MED ORDER — MUPIROCIN 2 % EX OINT
1.0000 "application " | TOPICAL_OINTMENT | Freq: Every day | CUTANEOUS | Status: DC
  Administered 2020-03-17 – 2020-03-27 (×3): 1 via TOPICAL
  Filled 2020-03-15 (×2): qty 22

## 2020-03-15 NOTE — Progress Notes (Signed)
Patient wife Romie Minus called back, patient has had some vague abdominal complaints with significant decrease of appetite and chronic diarrhea.  We have ordered CT abdominal with and without contrast to further characterize.

## 2020-03-15 NOTE — ED Provider Notes (Signed)
[facility] EMERGENCY DEPARTMENT Provider Note   CSN: [id number] Arrival date & time: 03/15/20  1047     History Chief Complaint  Patient presents with  . Weakness  . [name] is a 79 y.o. male.  HPI 79 year old male presents with falls. The history is somewhat limited as he is not a great historian. However he seems to indicate that he has fallen twice, yesterday and the day prior. Injured both thighs with fall. He did hit his head but states he thought it was a minor injury. He is on Xarelto. EMS noted soft blood pressure of 96/40 and gave a 250 cc IV fluid bolus. He denies fevers or cough. No headache. He states that he is falling because he has leg weakness. This is more prominent than typical though that has been going on for a while. It is both legs. Chronically has diarrhea of about 4 episodes per day. Has chronic leg swelling that he states is unchanged. He has on and off shortness of breath for quite some time but none now.   Past Medical History:  Diagnosis Date  . Atrial flutter ([name])    A. s/p prior ablation;  B.  s/p CTI ablation 10/24/10 (Dr. Caryl Comes)  . CAD (coronary artery disease)    A.  s/p CFX in the past;   B.  cath 06/2010: LAD 30-40%, D1 50%, OM1 50%, AVCFX stent 30%, dCFX 70-80% (med Rx), mRCA 40%;      C.  Echo 06/2010: EF 60-65%, mod LVH; mild LAE     . Complete heart block (H[facility])    a. s/p STJ pacemaker  . Cubital tunnel syndrome   . Degenerative disc disease   . Heart block   . HOH (hard of hearing)   . Hx of adenomatous colonic polyps   . Hyperlipidemia   . Hypertension   . Multiple myeloma   . Persistent atrial fibrillation ([place])   . Sleep apnea   . Venous insufficiency     Patient Active Problem List   Diagnosis Date Noted  . 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 Encounter for antineoplastic chemotherapy 01/03/2020  . Goals of care, counseling/discussion 01/03/2020  . A-fib ([place]) 09/11/2019  . Visit for  monitoring Tikosyn therapy 06/05/2018  . Persistent atrial fibrillation ([place])   . Pneumothorax on right 05/19/2017  . 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 Right-sided muscle weakness 04/28/2013  . 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 Coronary artery disease-nonobstructive catheter in October 2011 12/27/2010  . PACEMAKER, St. Jude dual-chamber 09/14/2010  . Multiple myeloma ([place]) 07/21/2010  . SMOKER 07/21/2010  . Obstructive sleep apnea 06/12/2009  . Atrioventricular block, heart high-grade 01/29/2009  . SICK SINUS/ TACHY-BRADY SYNDROME 01/29/2009  . Atrial tachycardia/fibrillation 01/14/2009  . SCIATICA 01/14/2009  . Essential hypertension 01/03/2009  . VENOUS INSUFFICIENCY 01/03/2009  . ALLERGY 01/03/2009    Past Surgical History:  Procedure Laterality Date  . CARDIOVERSION N/A 04/20/2018   Procedure: CARDIOVERSION;  Surgeon: Skeet Latch, MD;  Location: [facility];  Service: Cardiovascular;  Laterality: N/A;  . COLONOSCOPY  10-07-2009   TA polyp, tics, hems   . CORONARY ANGIOPLASTY WITH STENT PLACEMENT  2005  . PACEMAKER PLACEMENT  2011  . POLYPECTOMY    . PPM GENERATOR CHANGEOUT N/A 09/27/2019   Procedure: PPM GENERATOR CHANGEOUT;  Surgeon: Deboraha Sprang, MD;  Location: [facility];  Service: Cardiovascular;  Laterality: N/A;  .  Right olecranon nerve surgery    . VENTRAL HERNIA REPAIR         Family History  Problem Relation Age of Onset  . Heart disease Father   . Cancer Father        prostate  . Heart disease Brother   . Colon cancer Neg Hx     Social History   Tobacco Use  . Smoking status: Former Smoker    Packs/day: 1.00    Years: 49.00    Pack years: 49.00    Types: Cigarettes    Quit date: 09/12/2004    Years since quitting: 15.5  . Smokeless tobacco: Former Systems developer  . Tobacco comment: started at age 48; smoked 1 ppd; quit in 2006  Vaping Use  . Vaping Use: Never used  Substance Use  Topics  . Alcohol use: Yes    Alcohol/week: 28.0 standard drinks    Types: 28 Standard drinks or equivalent per week    Comment: occasional  . Drug use: No    Home Medications Prior to Admission medications   Medication Sig Start Date End Date Taking? Authorizing Provider  acyclovir (ZOVIRAX) 200 MG capsule Take 1 capsule (200 mg total) by mouth 2 (two) times daily. 01/02/20   Curt Bears, MD  atorvastatin (LIPITOR) 10 MG tablet Take 10 mg by mouth daily at 6 PM.     [provider]  dexamethasone (DECADRON) 4 MG tablet 10 tablet p.o. daily weekly start with the first day of the chemotherapy. 01/02/20   Curt Bears, MD  doxycycline (VIBRAMYCIN) 100 MG capsule Take 100 mg by mouth 2 (two) times daily. 02/15/20   [provider]  EPINEPHrine 0.3 mg/0.3 mL IJ SOAJ injection Inject 0.3 mg into the muscle once as needed for anaphylaxis. 04/25/17   [provider]  finasteride (PROSCAR) 5 MG tablet Take 5 mg by mouth daily.    [provider]  furosemide (LASIX) 40 MG tablet Take 1 tablet by mouth once daily Patient taking differently: Take 40 mg by mouth daily.  08/31/19   Baldwin Jamaica, PA-C  ketotifen (ALAWAY) 0.025 % ophthalmic solution Place 1 drop into both eyes 2 (two) times daily as needed (for dry eyes).     [provider]  lenalidomide (REVLIMID) 25 MG capsule 03/12/20-auth -[id number] adult male Take 1 capsule by mouth daily on days 1-14 then 7 days off. Repeat every 21 days. 03/12/20   Curt Bears, MD  losartan (COZAAR) 100 MG tablet Take 100 mg by mouth daily.    [provider]  Magnesium 200 MG TABS Take 1 tablet (200 mg total) by mouth daily. 05/08/18   Sherran Needs, NP  Multiple Vitamin (MULTIVITAMIN) capsule Take 1 capsule by mouth daily.      [provider]  mupirocin ointment (BACTROBAN) 2 % APPLY OINTMENT TOPICALLY TO AFFECTED AREA ONCE DAILY WITH DRESSING CHANGE. 02/15/20   [provider]   nitroGLYCERIN (NITROSTAT) 0.4 MG SL tablet PLACE ONE TABLET UNDER THE TONGUE EVERY 5 MINUTES AS NEEDED FOR CHEST PAIN Patient not taking: No sig reported 07/19/18   Josue Hector, MD  Omega-3 Fatty Acids (FISH OIL) 1200 MG CAPS Take 1,200 mg by mouth daily.     [provider]  Potassium Chloride ER 20 MEQ TBCR Take 1 tablet by mouth once daily 07/04/19   Baldwin Jamaica, PA-C  prochlorperazine (COMPAZINE) 10 MG tablet Take 1 tablet (10 mg total) by mouth every 6 (six) hours as needed  for nausea or vomiting. 01/02/20   Curt Bears, MD  thiamine 100 MG tablet Take 100 mg by mouth daily.    [provider]  Vitamins A & D (VITAMIN A & D) ointment Apply 1 application topically as needed for dry skin.    [provider]  XARELTO 20 MG TABS tablet TAKE 1 TABLET BY MOUTH ONCE DAILY WITH SUPPER 02/03/20   Deboraha Sprang, MD    Allergies    Bee venom  Review of Systems   Review of Systems  Constitutional: Negative for fever.  Respiratory: Positive for shortness of breath (on and off, none now). Negative for cough.   Cardiovascular: Positive for leg swelling. Negative for chest pain.  Gastrointestinal: Positive for diarrhea. Negative for abdominal pain.  Neurological: Positive for weakness. Negative for headaches.  All other systems reviewed and are negative.   Physical Exam Updated Vital Signs BP (!) 86/36   Pulse 70   Temp 100.3 F (37.9 C) (Rectal)   Resp 20   Ht 5' 10"  (1.778 m)   Wt 101.2 kg   SpO2 97%   BMI 32.00 kg/m   Physical Exam Vitals and nursing note reviewed.  Constitutional:      Appearance: He is well-developed. He is obese.  HENT:     Head: Normocephalic and atraumatic.     Right Ear: External ear normal.     Left Ear: External ear normal.     Nose: Nose normal.     Mouth/Throat:     Mouth: Mucous membranes are dry.  Eyes:     General:        Right eye: No discharge.        Left eye: No discharge.  Cardiovascular:      Rate and Rhythm: Normal rate and regular rhythm.     Heart sounds: Normal heart sounds.  Pulmonary:     Effort: Pulmonary effort is normal.     Breath sounds: Normal breath sounds. No wheezing or rales.  Abdominal:     Palpations: Abdomen is soft.     Tenderness: There is no abdominal tenderness.  Musculoskeletal:     Cervical back: Neck supple. No tenderness.     Right upper leg: No tenderness.     Left upper leg: Tenderness present. No deformity.     Right knee: Swelling present. No deformity. Tenderness present.     Left knee: Swelling present. No deformity. Tenderness present.     Right lower leg: Edema present.     Left lower leg: Edema present.     Comments: Mild pitting edema to Bilateral lower legs.   Skin:    General: Skin is warm and dry.     Comments: Skin tears to left hand/wrist. No tenderness, swelling or decreased ROM  Neurological:     Mental Status: He is alert and oriented to person, place, and time.     Comments: CN 3-12 grossly intact. Speech is slurred but he states he is talking normally. 5/5 strength in both upper extremities. He cannot lift lower extremities off stretcher. When I lift them he cannot hold them against gravity. Grossly normal sensation. Normal finger to nose.   Psychiatric:        Mood and Affect: Mood is not anxious.     ED Results / Procedures / Treatments   Labs (all labs ordered are listed, but only abnormal results are displayed) Labs Reviewed  BASIC METABOLIC PANEL - Abnormal; Notable for the following components:  Result Value   CO2 18 (*)    Glucose, Bld 100 (*)    Calcium 8.4 (*)    All other components within normal limits  CBC - Abnormal; Notable for the following components:   RBC 3.37 (*)    Hemoglobin 11.2 (*)    HCT 33.2 (*)    Platelets 93 (*)    nRBC 0.7 (*)    All other components within normal limits  HEPATIC FUNCTION PANEL - Abnormal; Notable for the following components:   Total Protein 5.6 (*)    Albumin  3.2 (*)    Alkaline Phosphatase 37 (*)    Total Bilirubin 1.5 (*)    Bilirubin, Direct 0.3 (*)    Indirect Bilirubin 1.2 (*)    All other components within normal limits  BRAIN NATRIURETIC PEPTIDE - Abnormal; Notable for the following components:   B Natriuretic Peptide 132.6 (*)    All other components within normal limits  LACTIC ACID, PLASMA - Abnormal; Notable for the following components:   Lactic Acid, Venous 2.8 (*)    All other components within normal limits  LACTIC ACID, PLASMA - Abnormal; Notable for the following components:   Lactic Acid, Venous 2.2 (*)    All other components within normal limits  PROTIME-INR - Abnormal; Notable for the following components:   Prothrombin Time 21.1 (*)    INR 1.9 (*)    All other components within normal limits  TROPONIN I (HIGH SENSITIVITY) - Abnormal; Notable for the following components:   Troponin I (High Sensitivity) 90 (*)    All other components within normal limits  TROPONIN I (HIGH SENSITIVITY) - Abnormal; Notable for the following components:   Troponin I (High Sensitivity) 97 (*)    All other components within normal limits  CULTURE, BLOOD (ROUTINE X 2)  CULTURE, BLOOD (ROUTINE X 2)  URINE CULTURE  SARS CORONAVIRUS 2 BY RT PCR (HOSPITAL ORDER, [facility])  ETHANOL  APTT  URINALYSIS, ROUTINE W REFLEX MICROSCOPIC  CBG MONITORING, ED    EKG EKG Interpretation  Date/Time:  Sunday March 15 2020 11:03:47 EDT Ventricular Rate:  70 PR Interval:    QRS Duration: 114 QT Interval:  414 QTC Calculation: 447 R Axis:   35 Text Interpretation: Ventricular-paced rhythm Abnormal ECG similar to 2019 Confirmed by Sherwood Gambler [phone]) on 03/15/2020 11:20:09 AM   Radiology DG Chest 2 View  Result Date: 03/15/2020 CLINICAL DATA:  Status post fall EXAM: CHEST - 2 VIEW COMPARISON:  June 06, 2017 FINDINGS: Mediastinal contour and cardiac silhouette are stable. There is no focal infiltrate, pulmonary  edema, or pleural effusion. Bony structures are stable. IMPRESSION: No active cardiopulmonary disease. Electronically Signed   By: Abelardo Diesel M.D.   On: 03/15/2020 13:36   CT Head Wo Contrast  Result Date: 03/15/2020 CLINICAL DATA:  Head trauma with headache EXAM: CT HEAD WITHOUT CONTRAST TECHNIQUE: Contiguous axial images were obtained from the base of the skull through the vertex without intravenous contrast. COMPARISON:  April 28, 2013 FINDINGS: Brain: No evidence of acute infarction, hemorrhage, hydrocephalus, extra-axial collection or mass lesion/mass effect. Signs of atrophy and mild chronic microvascular ischemic change Vascular: No hyperdense vessel or unexpected calcification. Skull: Normal. Negative for fracture or focal lesion. Sinuses/Orbits: No acute finding. Other: Scalp hematoma in the occipital parietal region in the midline. IMPRESSION: 1. No acute intracranial abnormality. 2. Scalp hematoma in the occipital parietal region in the midline of the subcutaneous, superficial scalp. Electronically Signed  By: Zetta Bills M.D.   On: 03/15/2020 12:55   DG Knee Complete 4 Views Left  Result Date: 03/15/2020 CLINICAL DATA:  Status post fall with knee pain. EXAM: LEFT KNEE - COMPLETE 4+ VIEW COMPARISON:  None. FINDINGS: No acute fracture or dislocation is noted. Degenerative joint changes with narrowed joint space and osteophyte formation are noted. IMPRESSION: No acute fracture or dislocation. Electronically Signed   By: Abelardo Diesel M.D.   On: 03/15/2020 13:38   DG Knee Complete 4 Views Right  Result Date: 03/15/2020 CLINICAL DATA:  Status post fall with pain EXAM: RIGHT KNEE - COMPLETE 4+ VIEW COMPARISON:  May 07, 2019 FINDINGS: Degenerative joint changes of the right knee with narrowed joint space and osteophyte formation are identified. There is no acute fracture or dislocation. IMPRESSION: Degenerative joint changes of right knee. No acute fracture or dislocation noted.  Electronically Signed   By: Abelardo Diesel M.D.   On: 03/15/2020 13:37   DG Femur Min 2 Views Left  Result Date: 03/15/2020 CLINICAL DATA:  Status post fall with weakness of bilateral lower extremities. EXAM: LEFT FEMUR 2 VIEWS COMPARISON:  None. FINDINGS: No acute fracture or dislocation is noted. Degenerative joint changes of the left hip and left knee are identified with narrowed joint space and osteophyte formation. IMPRESSION: No acute fracture or dislocation. Electronically Signed   By: Abelardo Diesel M.D.   On: 03/15/2020 13:38    Procedures .Critical Care Performed by: Sherwood Gambler, MD Authorized by: Sherwood Gambler, MD   Critical care provider statement:    Critical care time (minutes):  45   Critical care time was exclusive of:  Separately billable procedures and treating other patients   Critical care was necessary to treat or prevent imminent or life-threatening deterioration of the following conditions:  Sepsis and circulatory failure   Critical care was time spent personally by me on the following activities:  Discussions with consultants, evaluation of patient's response to treatment, examination of patient, ordering and performing treatments and interventions, ordering and review of laboratory studies, ordering and review of radiographic studies, pulse oximetry, re-evaluation of patient's condition, obtaining history from patient or surrogate and review of old charts   (including critical care time)  Medications Ordered in ED Medications  sodium chloride flush (NS) 0.9 % injection 3 mL (3 mLs Intravenous Not Given 03/15/20 1119)  metroNIDAZOLE (FLAGYL) IVPB 500 mg (500 mg Intravenous New Bag/Given 03/15/20 1333)  vancomycin (VANCOREADY) IVPB 2000 mg/400 mL (2,000 mg Intravenous New Bag/Given 03/15/20 1353)  ceFEPIme (MAXIPIME) 2 g in sodium chloride 0.9 % 100 mL IVPB (has no administration in time range)  vancomycin (VANCOREADY) IVPB 750 mg/150 mL (has no administration in time  range)  sodium chloride 0.9 % bolus 500 mL (0 mLs Intravenous Stopped 03/15/20 1211)  ceFEPIme (MAXIPIME) 2 g in sodium chloride 0.9 % 100 mL IVPB (2 g Intravenous New Bag/Given 03/15/20 1332)  sodium chloride 0.9 % bolus 500 mL (0 mLs Intravenous Stopped 03/15/20 1353)  lactated ringers bolus 1,000 mL (1,000 mLs Intravenous New Bag/Given 03/15/20 1352)    And  lactated ringers bolus 1,000 mL (1,000 mLs Intravenous New Bag/Given 03/15/20 1352)    ED Course  I have reviewed the triage vital signs and the nursing notes.  Pertinent labs & imaging results that were available during my care of the patient were reviewed by me and considered in my medical decision making (see chart for details).  Clinical Course as of Mar 15 1430  Nancy Fetter  Mar 15, 2020  1155 Patient's rectal temp is borderline, but not quite febrile. Will send cultures and lactate. He is not outright hypotensive but borderline BPs. Will eval with labs, xrays, CT.   [SG]  1246 Patient's lactic acid is 2.8.  At this point there is still no clear etiology of potential sepsis.  It could just be that he is dehydrated given his chronic diarrhea.  However in the setting of soft blood pressures and low-grade temperature, will treat for unknown sepsis.   [SG]  1340 Patient has now had multiple blood pressures in the 70s.  Given concern for possible sepsis he will be given 30 cc/KG fluids.   [SG]    Clinical Course User Index [SG] Sherwood Gambler, MD   MDM Rules/Calculators/A&P                          Patient is on treatment for multiple myeloma and thus is higher risk for infection.  I think given this with the low-grade temperature and soft blood pressures he will be treated for sepsis.  This could be just dehydration from his chronic diarrhea.  However, he was given Vang, cefepime, Flagyl and 30 cc/KG IV fluids.  He does not appear volume overloaded at this time and his most recent ultrasound a couple years ago did not show systolic CHF.  I  discussed with Dr. Roosevelt Locks for admission.  Urinalysis is currently pending.  He is not altered or having meningismus and so I think CNS infection is unlikely. Final Clinical Impression(s) / ED Diagnoses Final diagnoses:  Septic shock [facility])    Rx / DC Orders ED Discharge Orders    None       Sherwood Gambler, MD 03/15/20 1432

## 2020-03-15 NOTE — H&P (Signed)
History and Physical    [name] DHR:[id number] DOB: 08/06/1941 DOA: 03/15/2020  PCP: [name], MD (Confirm with patient/family/NH records and if not entered, this has to be entered at [facility] point of entry) Patient coming from: Home  I have personally briefly reviewed patient's old medical records in [place]  Chief Complaint: Feeling weak  HPI: [name] is a 79 y.o. male with medical history significant of multiple myeloma on chemotherapy, CAD, A. fib, hypertension presented with generalized weakness and increasing leg pain. Patient has a history of smoldering multiple myeloma has been under surveillance until about 2-3 months ago, combined chemotherapy was started every 4 weeks (3 weeks medication +1-week holiday), was in the middle of the third cycle about 2 weeks ago. Even at that point, patient reported to have increasing generalized weakness while he was in the oncologist office on June 15. Over the last 2 days patient fell on 2 times. He complains about feeling generalized weakness, especially bilateral legs, he felt lightheaded. Patient also reported chronic diarrhea, not every day but most occasions loose and can be 3-4 bouts a day, also complains about vague peri-umbilical abd pain, no N/V. No cough, no urinary symptoms. ED Course: BP low side, no tachycardia no hypoxia. UA unremarkable, chest x-ray no significant infiltrates. WBC 9.9, creatinine 0.9 bicarb 18, glucose 93  Review of Systems: As per HPI otherwise 10 point review of systems negative.    Past Medical History:  Diagnosis Date  . Atrial flutter ([place])    A. s/p prior ablation;  B.  s/p CTI ablation 10/24/10 (Dr. Caryl Comes)  . CAD (coronary artery disease)    A.  s/p CFX in the past;   B.  cath 06/2010: LAD 30-40%, D1 50%, OM1 50%, AVCFX stent 30%, dCFX 70-80% (med Rx), mRCA 40%;      C.  Echo 06/2010: EF 60-65%, mod LVH; mild LAE     . Complete heart block (H[facility])    a. s/p STJ pacemaker  . Cubital  tunnel syndrome   . Degenerative disc disease   . Heart block   . HOH (hard of hearing)   . Hx of adenomatous colonic polyps   . Hyperlipidemia   . Hypertension   . Multiple myeloma   . Persistent atrial fibrillation ([place])   . Sleep apnea   . Venous insufficiency     Past Surgical History:  Procedure Laterality Date  . CARDIOVERSION N/A 04/20/2018   Procedure: CARDIOVERSION;  Surgeon: Skeet Latch, MD;  Location: [facility];  Service: Cardiovascular;  Laterality: N/A;  . COLONOSCOPY  10-07-2009   TA polyp, tics, hems   . CORONARY ANGIOPLASTY WITH STENT PLACEMENT  2005  . PACEMAKER PLACEMENT  2011  . POLYPECTOMY    . PPM GENERATOR CHANGEOUT N/A 09/27/2019   Procedure: PPM GENERATOR CHANGEOUT;  Surgeon: Deboraha Sprang, MD;  Location: [facility];  Service: Cardiovascular;  Laterality: N/A;  . Right olecranon nerve surgery    . VENTRAL HERNIA REPAIR       reports that he quit smoking about 15 years ago. His smoking use included cigarettes. He has a 49.00 pack-year smoking history. He has quit using smokeless tobacco. He reports current alcohol use of about 28.0 standard drinks of alcohol per week. He reports that he does not use drugs.  Allergies  Allergen Reactions  . Bee Venom Anaphylaxis         Family History  Problem Relation Age of Onset  .  Heart disease Father   . Cancer Father        prostate  . Heart disease Brother   . Colon cancer Neg Hx      Prior to Admission medications   Medication Sig Start Date End Date Taking? Authorizing Provider  acyclovir (ZOVIRAX) 200 MG capsule Take 1 capsule (200 mg total) by mouth 2 (two) times daily. 01/02/20   Curt Bears, MD  atorvastatin (LIPITOR) 10 MG tablet Take 10 mg by mouth daily at 6 PM.     [provider]  dexamethasone (DECADRON) 4 MG tablet 10 tablet p.o. daily weekly start with the first day of the chemotherapy. 01/02/20   Curt Bears, MD  doxycycline (VIBRAMYCIN) 100 MG capsule Take  100 mg by mouth 2 (two) times daily. 02/15/20   [provider]  EPINEPHrine 0.3 mg/0.3 mL IJ SOAJ injection Inject 0.3 mg into the muscle once as needed for anaphylaxis. 04/25/17   [provider]  finasteride (PROSCAR) 5 MG tablet Take 5 mg by mouth daily.    [provider]  furosemide (LASIX) 40 MG tablet Take 1 tablet by mouth once daily Patient taking differently: Take 40 mg by mouth daily.  08/31/19   Baldwin Jamaica, PA-C  ketotifen (ALAWAY) 0.025 % ophthalmic solution Place 1 drop into both eyes 2 (two) times daily as needed (for dry eyes).     [provider]  lenalidomide (REVLIMID) 25 MG capsule 03/12/20-auth -[id number] adult male Take 1 capsule by mouth daily on days 1-14 then 7 days off. Repeat every 21 days. 03/12/20   Curt Bears, MD  losartan (COZAAR) 100 MG tablet Take 100 mg by mouth daily.    [provider]  Magnesium 200 MG TABS Take 1 tablet (200 mg total) by mouth daily. 05/08/18   Sherran Needs, NP  Multiple Vitamin (MULTIVITAMIN) capsule Take 1 capsule by mouth daily.      [provider]  mupirocin ointment (BACTROBAN) 2 % APPLY OINTMENT TOPICALLY TO AFFECTED AREA ONCE DAILY WITH DRESSING CHANGE. 02/15/20   [provider]  nitroGLYCERIN (NITROSTAT) 0.4 MG SL tablet PLACE ONE TABLET UNDER THE TONGUE EVERY 5 MINUTES AS NEEDED FOR CHEST PAIN Patient not taking: No sig reported 07/19/18   Josue Hector, MD  Omega-3 Fatty Acids (FISH OIL) 1200 MG CAPS Take 1,200 mg by mouth daily.     [provider]  Potassium Chloride ER 20 MEQ TBCR Take 1 tablet by mouth once daily 07/04/19   Baldwin Jamaica, PA-C  prochlorperazine (COMPAZINE) 10 MG tablet Take 1 tablet (10 mg total) by mouth every 6 (six) hours as needed for nausea or vomiting. 01/02/20   Curt Bears, MD  thiamine 100 MG tablet Take 100 mg by mouth daily.    [provider]  Vitamins A & D (VITAMIN A & D) ointment Apply 1 application  topically as needed for dry skin.    [provider]  XARELTO 20 MG TABS tablet TAKE 1 TABLET BY MOUTH ONCE DAILY WITH SUPPER 02/03/20   Deboraha Sprang, MD    Physical Exam: Vitals:   03/15/20 1215 03/15/20 1335 03/15/20 1345 03/15/20 1445  BP: 95/74 (!) 82/54 (!) 86/36 (!) 86/52  Pulse: 70 69 70 70  Resp: (!) 22 (!) 24 20 (!) 28  Temp:      TempSrc:      SpO2: 96% 93% 97% 92%  Weight:      Height:  Constitutional: NAD, calm, comfortable Vitals:   03/15/20 1215 03/15/20 1335 03/15/20 1345 03/15/20 1445  BP: 95/74 (!) 82/54 (!) 86/36 (!) 86/52  Pulse: 70 69 70 70  Resp: (!) 22 (!) 24 20 (!) 28  Temp:      TempSrc:      SpO2: 96% 93% 97% 92%  Weight:      Height:       Eyes: PERRL, lids and conjunctivae normal ENMT: Mucous membranes are dry. Posterior pharynx clear of any exudate or lesions.Normal dentition.  Neck: normal, supple, no masses, no thyromegaly Respiratory: clear to auscultation bilaterally, no wheezing, no crackles. Normal respiratory effort. No accessory muscle use.  Cardiovascular: Regular rate and rhythm, no murmurs / rubs / gallops. 1+ extremity edema, with chronic Lymph[place]a-like changes. 2+ pedal pulses. No carotid bruits.  Abdomen: no tenderness, no masses palpated. No hepatosplenomegaly. Bowel sounds positive.  Musculoskeletal: no clubbing / cyanosis. No joint deformity upper and lower extremities. Good ROM, no contractures. Normal muscle tone.  Skin: no rashes, lesions, ulcers. No induration Neurologic: CN 2-12 grossly intact. Sensation intact, DTR normal. Strength 5/5 in all 4.  Psychiatric: Sleepy     Labs on Admission: I have personally reviewed following labs and imaging studies  CBC: Recent Labs  Lab 03/10/20 0732 03/15/20 1113  WBC 8.5 9.9  NEUTROABS 6.0  --   HGB 11.7* 11.2*  HCT 34.2* 33.2*  MCV 98.3 98.5  PLT 114* 93*   Basic Metabolic Panel: Recent Labs  Lab 03/10/20 0732 03/15/20 1113  NA 140 136  K 4.3 3.5   CL 109 109  CO2 21* 18*  GLUCOSE 94 100*  BUN 15 22  CREATININE 0.75 0.98  CALCIUM 9.5 8.4*   GFR: Estimated Creatinine Clearance: 71.7 mL/min (by C-G formula based on SCr of 0.98 mg/dL). Liver Function Tests: Recent Labs  Lab 03/10/20 0732 03/15/20 1138  AST 22 20  ALT 26 20  ALKPHOS 49 37*  BILITOT 1.1 1.5*  PROT 5.9* 5.6*  ALBUMIN 3.4* 3.2*   No results for input(s): LIPASE, AMYLASE in the last 168 hours. No results for input(s): AMMONIA in the last 168 hours. Coagulation Profile: Recent Labs  Lab 03/15/20 1334  INR 1.9*   Cardiac Enzymes: No results for input(s): CKTOTAL, CKMB, CKMBINDEX, TROPONINI in the last 168 hours. BNP (last 3 results) No results for input(s): PROBNP in the last 8760 hours. HbA1C: No results for input(s): HGBA1C in the last 72 hours. CBG: Recent Labs  Lab 03/15/20 1206  GLUCAP 91   Lipid Profile: No results for input(s): CHOL, HDL, LDLCALC, TRIG, CHOLHDL, LDLDIRECT in the last 72 hours. Thyroid Function Tests: No results for input(s): TSH, T4TOTAL, FREET4, T3FREE, THYROIDAB in the last 72 hours. Anemia Panel: No results for input(s): VITAMINB12, FOLATE, FERRITIN, TIBC, IRON, RETICCTPCT in the last 72 hours. Urine analysis:    Component Value Date/Time   COLORURINE YELLOW 03/15/2020 1445   APPEARANCEUR CLEAR 03/15/2020 1445   LABSPEC 1.014 03/15/2020 1445   PHURINE 5.0 03/15/2020 1445   GLUCOSEU NEGATIVE 03/15/2020 1445   HGBUR NEGATIVE 03/15/2020 1445   BILIRUBINUR NEGATIVE 03/15/2020 1445   KETONESUR NEGATIVE 03/15/2020 1445   PROTEINUR NEGATIVE 03/15/2020 1445   UROBILINOGEN 0.2 04/28/2013 1949   NITRITE NEGATIVE 03/15/2020 1445   LEUKOCYTESUR NEGATIVE 03/15/2020 1445    Radiological Exams on Admission: DG Chest 2 View  Result Date: 03/15/2020 CLINICAL DATA:  Status post fall EXAM: CHEST - 2 VIEW COMPARISON:  June 06, 2017 FINDINGS: Mediastinal contour  and cardiac silhouette are stable. There is no focal infiltrate,  pulmonary edema, or pleural effusion. Bony structures are stable. IMPRESSION: No active cardiopulmonary disease. Electronically Signed   By: Abelardo Diesel M.D.   On: 03/15/2020 13:36   CT Head Wo Contrast  Result Date: 03/15/2020 CLINICAL DATA:  Head trauma with headache EXAM: CT HEAD WITHOUT CONTRAST TECHNIQUE: Contiguous axial images were obtained from the base of the skull through the vertex without intravenous contrast. COMPARISON:  April 28, 2013 FINDINGS: Brain: No evidence of acute infarction, hemorrhage, hydrocephalus, extra-axial collection or mass lesion/mass effect. Signs of atrophy and mild chronic microvascular ischemic change Vascular: No hyperdense vessel or unexpected calcification. Skull: Normal. Negative for fracture or focal lesion. Sinuses/Orbits: No acute finding. Other: Scalp hematoma in the occipital parietal region in the midline. IMPRESSION: 1. No acute intracranial abnormality. 2. Scalp hematoma in the occipital parietal region in the midline of the subcutaneous, superficial scalp. Electronically Signed   By: Zetta Bills M.D.   On: 03/15/2020 12:55   DG Knee Complete 4 Views Left  Result Date: 03/15/2020 CLINICAL DATA:  Status post fall with knee pain. EXAM: LEFT KNEE - COMPLETE 4+ VIEW COMPARISON:  None. FINDINGS: No acute fracture or dislocation is noted. Degenerative joint changes with narrowed joint space and osteophyte formation are noted. IMPRESSION: No acute fracture or dislocation. Electronically Signed   By: Abelardo Diesel M.D.   On: 03/15/2020 13:38   DG Knee Complete 4 Views Right  Result Date: 03/15/2020 CLINICAL DATA:  Status post fall with pain EXAM: RIGHT KNEE - COMPLETE 4+ VIEW COMPARISON:  May 07, 2019 FINDINGS: Degenerative joint changes of the right knee with narrowed joint space and osteophyte formation are identified. There is no acute fracture or dislocation. IMPRESSION: Degenerative joint changes of right knee. No acute fracture or dislocation noted.  Electronically Signed   By: Abelardo Diesel M.D.   On: 03/15/2020 13:37   DG Femur Min 2 Views Left  Result Date: 03/15/2020 CLINICAL DATA:  Status post fall with weakness of bilateral lower extremities. EXAM: LEFT FEMUR 2 VIEWS COMPARISON:  None. FINDINGS: No acute fracture or dislocation is noted. Degenerative joint changes of the left hip and left knee are identified with narrowed joint space and osteophyte formation. IMPRESSION: No acute fracture or dislocation. Electronically Signed   By: Abelardo Diesel M.D.   On: 03/15/2020 13:38    EKG: Independently reviewed. Ventricular paced  Assessment/Plan Active Problems:   Sepsis ([place])  (please populate well all problems here in Problem List. (For example, if patient is on BP meds at home and you resume or decide to hold them, it is a problem that needs to be her. Same for CAD, COPD, HLD and so on)  Sepsis -Unknown source, patient is immune compromised with multiple myeloma and ongoing chemotherapy. Continue broad coverage with vancomycin and cefepime. UA unremarkable, chest x-ray no acute infiltrates, no significant cellulitis changes. Send stool to rule out C. Difficile -Clinically patient is hypovolemic, start maintaining IV fluids reevaluate in the a.m. -Try to reach patient wife and daughter, left message to both of them. -Send echo, TSH and cortisol level. -Procalcitonin level and repeat level to guide de-escalation of antibiotics  MM -Follow-up oncology as outpatient and chemotherapy  HTN -Hold BP meds  A-fib, paroxysmal -Paced rhythm, continue systemic anticoagulation  Generalized weakness -PT evaluation starting tomorrow  DVT prophylaxis: Xarelto Code Status: Full code Family Communication: Left wife and daughter message Disposition Plan: Patient has complicated medical background with  immunocompromised came with sepsis, expect more than 2 midnight hospital stay. Consults called: None Admission status:PCU   Lequita Halt  MD [facility] Pager [phone]    03/15/2020, 4:07 PM

## 2020-03-15 NOTE — ED Notes (Signed)
Abx delayed d/t extended time in CT and xray departments for multiple scans. Just returned to room, starting abx now.

## 2020-03-15 NOTE — Progress Notes (Signed)
Pharmacy Antibiotic Note  [name] is a 79 y.o. male admitted on 03/15/2020 with generalized weakness, concern for infection of unknown source.  Pharmacy has been consulted for vancomycin and cefepime dosing. T100.3, LA 2.8, WBC wnl, RR 22, hypotensive  Plan: Vancomycin 2000 mg IV x 1, then 750mg  IV every 12 hours (Vancomycin trough target 15-20) Cefepime 2g IV every 8 hours Monitor renal function, Cx and clinical progression to narrow Vancomycin trough at steady state  Height: 5\' 10"  (177.8 cm) Weight: 101.2 kg (223 lb) IBW/kg (Calculated) : 73  Temp (24hrs), Avg:100.1 F (37.8 C), Min:99.9 F (37.7 C), Max:100.3 F (37.9 C)  Recent Labs  Lab 03/10/20 0732 03/15/20 1113 03/15/20 1156  WBC 8.5 9.9  --   CREATININE 0.75 0.98  --   LATICACIDVEN  --   --  2.8*    Estimated Creatinine Clearance: 71.7 mL/min (by C-G formula based on SCr of 0.98 mg/dL).    Allergies  Allergen Reactions  . Bee Venom Anaphylaxis         Antimicrobials this admission: Vanco 7/4>> Cefepime 7/4>>  Dose adjustments this admission:   Microbiology results: 7/4 BCx: sent 7/4 UCx: sent   Bertis Ruddy, PharmD Clinical Pharmacist ED Pharmacist Phone # [phone] 03/15/2020 12:58 PM

## 2020-03-15 NOTE — ED Triage Notes (Addendum)
Pt to triage via [facility] from home.  Reports generalized weakness and bilateral lower extremity edema/pain.  Pt fell yesterday and hit head.  Takes Xerelto.   Frequent falls with multiple bruises.  Denies LOC.  CBG 136.  Pt hypotensive with EMS 96/40.  20g RFA.  NS 250cc bolus.

## 2020-03-16 ENCOUNTER — Inpatient Hospital Stay (HOSPITAL_COMMUNITY): Payer: Medicare HMO

## 2020-03-16 DIAGNOSIS — C9000 Multiple myeloma not having achieved remission: Secondary | ICD-10-CM

## 2020-03-16 DIAGNOSIS — I361 Nonrheumatic tricuspid (valve) insufficiency: Secondary | ICD-10-CM

## 2020-03-16 DIAGNOSIS — I480 Paroxysmal atrial fibrillation: Secondary | ICD-10-CM

## 2020-03-16 DIAGNOSIS — I10 Essential (primary) hypertension: Secondary | ICD-10-CM

## 2020-03-16 LAB — CBC
HCT: 30.8 % — ABNORMAL LOW (ref 39.0–52.0)
Hemoglobin: 10.9 g/dL — ABNORMAL LOW (ref 13.0–17.0)
MCH: 34.3 pg — ABNORMAL HIGH (ref 26.0–34.0)
MCHC: 35.4 g/dL (ref 30.0–36.0)
MCV: 96.9 fL (ref 80.0–100.0)
Platelets: 73 10*3/uL — ABNORMAL LOW (ref 150–400)
RBC: 3.18 MIL/uL — ABNORMAL LOW (ref 4.22–5.81)
RDW: 13.2 % (ref 11.5–15.5)
WBC: 9.6 10*3/uL (ref 4.0–10.5)
nRBC: 0.3 % — ABNORMAL HIGH (ref 0.0–0.2)

## 2020-03-16 LAB — ECHOCARDIOGRAM COMPLETE
Height: 70 in
Weight: 4024 oz

## 2020-03-16 LAB — BASIC METABOLIC PANEL
Anion gap: 8 (ref 5–15)
BUN: 21 mg/dL (ref 8–23)
CO2: 16 mmol/L — ABNORMAL LOW (ref 22–32)
Calcium: 8 mg/dL — ABNORMAL LOW (ref 8.9–10.3)
Chloride: 111 mmol/L (ref 98–111)
Creatinine, Ser: 0.88 mg/dL (ref 0.61–1.24)
GFR calc Af Amer: >60 mL/min (ref >60)
GFR calc non Af Amer: >60 mL/min (ref >60)
Glucose, Bld: 74 mg/dL (ref 70–99)
Potassium: 3.4 mmol/L — ABNORMAL LOW (ref 3.5–5.1)
Sodium: 135 mmol/L (ref 135–145)

## 2020-03-16 LAB — URINE CULTURE: Culture: NO GROWTH

## 2020-03-16 LAB — C DIFFICILE QUICK SCREEN W PCR REFLEX
C Diff antigen: NEGATIVE
C Diff interpretation: NOT DETECTED
C Diff toxin: NEGATIVE

## 2020-03-16 LAB — PROCALCITONIN: Procalcitonin: 4.28 ng/mL

## 2020-03-16 MED ORDER — POTASSIUM CHLORIDE CRYS ER 20 MEQ PO TBCR
40.0000 meq | EXTENDED_RELEASE_TABLET | Freq: Once | ORAL | Status: AC
  Administered 2020-03-16: 40 meq via ORAL
  Filled 2020-03-16: qty 2

## 2020-03-16 MED ORDER — PERFLUTREN LIPID MICROSPHERE
1.0000 mL | INTRAVENOUS | Status: AC | PRN
  Administered 2020-03-16: 5 mL via INTRAVENOUS
  Filled 2020-03-16: qty 10

## 2020-03-16 MED ORDER — SODIUM CHLORIDE 0.9 % IV SOLN: INTRAVENOUS | Status: DC

## 2020-03-16 MED ORDER — CIPROFLOXACIN HCL 0.3 % OP SOLN
1.0000 [drp] | OPHTHALMIC | Status: DC
  Administered 2020-03-16 – 2020-03-27 (×52): 1 [drp] via OPHTHALMIC
  Filled 2020-03-16 (×3): qty 2.5

## 2020-03-16 NOTE — Evaluation (Signed)
Physical Therapy Evaluation Patient Details Name: [name] MRN: [id number] DOB: 04-18-1941 Today's Date: 03/16/2020   History of Present Illness  79 y.o. male with medical history significant of multiple myeloma on chemotherapy, CAD, A. fib, hypertension presented with generalized weakness and increasing leg pain. Presents to ED after 2 falls in the last 2 days and c/o of chronic diarrhea. Admitted 03/15/20 for treatment of sepsis and weakness.  Clinical Impression  PTA pt living with wife in single story home with 4 steps to enter. Pt reports ambulation around his home with cane. Pt reports independence with bathing, requires assist with lower body dressing. Family provides for iADLs. Pt reports he sleeps in his recliner. Likely pt is moving less than he thinks as he has a stage 2 pressure injury on his sacrum. In attempt to get pt out bed pt found to be incontinent of stool and requires total A for rolling R and L for cleaning. Pt so fatigue unable to come to seated on edge of bed. PT recommending SNF level rehab at discharge to improve mobility prior to going home. PT will continue to follow acutely.      Follow Up Recommendations SNF    Equipment Recommendations  Other (comment) (TBD at next venue)    Recommendations for Other Services OT consult     Precautions / Restrictions Precautions Precautions: Fall Precaution Comments: fall prior to hospitalization  Restrictions Weight Bearing Restrictions: No      Mobility  Bed Mobility Overal bed mobility: Needs Assistance Bed Mobility: Rolling Rolling: Total assist         General bed mobility comments: pt found to be incontinent of stool with taking down blanket, requires total A for rolling R and L for cleaning, pt requiring increased cuing and assist to reach across body to bedrail   Transfers                 General transfer comment: unable to attempt due to pt fatigue with rolling and cleaning up              Pertinent Vitals/Pain Pain Assessment: 0-10 Pain Score: 7  Pain Location: L hip, thigh and elbow from fall  Pain Descriptors / Indicators: Aching;Sore Pain Intervention(s): Limited activity within patient's tolerance;Monitored during session;Repositioned    Home Living Family/patient expects to be discharged to:: Private residence Living Arrangements: Spouse/significant other Available Help at Discharge: Family;Available 24 hours/day Type of Home: House Home Access: Stairs to enter Entrance Stairs-Rails: Psychiatric nurse of Steps: 4 Home Layout: Able to live on main level with bedroom/bathroom Home Equipment: Cane - single point;Wheelchair - [employer] - 2 wheels      Prior Function Level of Independence: Needs assistance   Gait / Transfers Assistance Needed: reports ambulation household distances with cane  ADL's / Homemaking Assistance Needed: independent with bathing, wife assists with lower body dressing and iADLS           Extremity/Trunk Assessment   Upper Extremity Assessment Upper Extremity Assessment: Generalized weakness    Lower Extremity Assessment Lower Extremity Assessment: LLE deficits/detail;RLE deficits/detail RLE Deficits / Details: hip and knee lacking full flexion and extension, increased pain with movement RLE Sensation: decreased light touch RLE Coordination: decreased fine motor;decreased gross motor LLE Deficits / Details: L hip and knee painful with AAROM, LLE: Unable to fully assess due to pain LLE Sensation: decreased light touch LLE Coordination: decreased fine motor;decreased gross motor       Communication   Communication: [facility]  Cognition Arousal/Alertness: Awake/alert Behavior During Therapy: Flat affect Overall Cognitive Status: Impaired/Different from baseline Area of Impairment: Following commands;Awareness                       Following Commands: Follows one step commands with increased  time;Follows multi-step commands with increased time   Awareness: Emergent   General Comments: pt requires increased time for initiation of movement       General Comments General comments (skin integrity, edema, etc.): Pt with stage 2 sacral wound on L sacrum. RN notified on need for air mattress, VSS on RA        Assessment/Plan    PT Assessment Patient needs continued PT services  PT Problem List Decreased strength;Decreased range of motion;Decreased activity tolerance;Decreased mobility;Decreased cognition;Impaired sensation;Pain       PT Treatment Interventions DME instruction;Gait training;Functional mobility training;Therapeutic activities;Therapeutic exercise;Balance training;Cognitive remediation;Patient/family education    PT Goals (Current goals can be found in the Care Plan section)  Acute Rehab PT Goals Patient Stated Goal: have less pain PT Goal Formulation: With patient Time For Goal Achievement: 03/30/20 Potential to Achieve Goals: Fair    Frequency Min 2X/week    AM-PAC PT "6 Clicks" Mobility  Outcome Measure Help needed turning from your back to your side while in a flat bed without using bedrails?: Total Help needed moving from lying on your back to sitting on the side of a flat bed without using bedrails?: Total Help needed moving to and from a bed to a chair (including a wheelchair)?: Total Help needed standing up from a chair using your arms (e.g., wheelchair or bedside chair)?: Total Help needed to walk in hospital room?: Total Help needed climbing 3-5 steps with a railing? : Total 6 Click Score: 6    End of Session   Activity Tolerance: Patient limited by fatigue Patient left: in bed;with call bell/phone within reach;with bed alarm set Nurse Communication: Mobility status;Other (comment) (need for air bed ) PT Visit Diagnosis: Muscle weakness (generalized) (M62.81);Pain Pain - Right/Left:  (L>R) Pain - part of body: Hip;Knee    Time:  1529-1610 PT Time Calculation (min) (ACUTE ONLY): 41 min   Charges:   PT Evaluation $PT Eval Moderate Complexity: 1 Mod PT Treatments $Therapeutic Activity: 23-37 mins        Joliyah Lippens B. Migdalia Dk PT, DPT Acute Rehabilitation Services Pager [phone] Office [phone]   [place] 03/16/2020, 5:05 PM

## 2020-03-16 NOTE — Progress Notes (Signed)
Patient ID: [name], male   DOB: 06/17/1941, 79 y.o.   MRN: [id number]  PROGRESS NOTE    Darryl F Sessler  MRN:[id number] DOB: 04/20/1941 DOA: 03/15/2020 PCP: Avva, Ravisankar, MD   Brief Narrative:  79-year-old male with history of multiple myeloma on chemotherapy, CAD, A. fib, hypertension presented with generalized weakness and increasing leg pain on 03/15/2020.  On presentation, he had a low-grade temperature his blood pressure was on the lower side but there was no tachycardia or hypoxia.  Urinalysis was unremarkable; chest x-ray showed no infiltrate.  He is slightly elevated lactic acid.  He was started on broad-spectrum antibiotics and IV fluids.  Assessment & Plan:   Probable sepsis: Present on admission -Unclear source.  Patient had a low-grade temperature on presentation, was hypotensive and had slightly elevated lactic acid.  UA was unremarkable.  Chest x-ray showed no infiltrates.  No cellulitic changes.  CT of the abdomen and pelvis with contrast showed diverticulosis without diverticulitis. -Had low-grade temperature overnight again.  Continue IV fluids and broad-spectrum antibiotics.  Follow cultures.  Procalcitonin elevated at 4.28 this morning. -Stool for C. difficile negative.  COVID-19 testing negative.  Multiple myeloma, currently on chemotherapy -Follow-up with oncology as an outpatient.  Apparently his appointment is tomorrow with oncology.  Generalized weakness -PT eval  Hypertension -Blood pressure still on the lower side.  Monitor.  Antihypertensives on hold.  Paroxysmal A. fib -Paced rhythm.  Continue Xarelto  Dyslipidemia -Continue statin  DVT prophylaxis: Xarelto Code Status: Full Family Communication: Spoke to wife/Jean on phone on 03/16/2020 Disposition Plan: Status is: Inpatient  Remains inpatient appropriate because:Inpatient level of care appropriate due to severity of illness   Dispo: The patient is from: Home              Anticipated d/c is  to: Home              Anticipated d/c date is: 2 days              Patient currently is not medically stable to d/c.   Consultants: None  Procedures: Echo ordered is pending  Antimicrobials: Vancomycin and cefepime from 03/15/2020 onwards   Subjective: Patient seen and examined at bedside.  Sleepy, wakes up slightly, very hard of hearing and poor historian.  Feels weak.  Denies any worsening abdominal pain or diarrhea.  Had low-grade temperature overnight.  Objective: Vitals:   03/15/20 2100 03/16/20 0039 03/16/20 0424 03/16/20 0749  BP: (!) 90/54 (!) 100/56 (!) 98/50 (!) 96/53  Pulse:  70 70 70  Resp: 20 20 14 15  Temp:  100.1 F (37.8 C) 99.7 F (37.6 C) 98.9 F (37.2 C)  TempSrc:  Axillary Axillary Oral  SpO2:  95% 95% 92%  Weight:   114.1 kg   Height:        Intake/Output Summary (Last 24 hours) at 03/16/2020 1059 Last data filed at 03/15/2020 1900 Gross per 24 hour  Intake 1966.22 ml  Output 675 ml  Net 1291.22 ml   Filed Weights   03/15/20 1159 03/16/20 0424  Weight: 101.2 kg 114.1 kg    Examination:  General exam: Appears calm and comfortable.  Elderly male lying in bed.  Poor historian. Respiratory system: Bilateral decreased breath sounds at bases Cardiovascular system: S1 & S2 heard, Rate controlled Gastrointestinal system: Abdomen is nondistended, soft and nontender. Normal bowel sounds heard. Extremities: No cyanosis, clubbing; chronic lymphedema-like changes present Central nervous system: Sleepy, wakes up slightly, very hard of hearing   and poor historian, speech is also not that clear.  No focal neurological deficits. Moving extremities Skin: No rashes, lesions or ulcers Psychiatry: Could not be assessed because of mental status.     Data Reviewed: I have personally reviewed following labs and imaging studies  CBC: Recent Labs  Lab 03/10/20 0732 03/15/20 1113 03/16/20 0259  WBC 8.5 9.9 9.6  NEUTROABS 6.0  --   --   HGB 11.7* 11.2* 10.9*  HCT  34.2* 33.2* 30.8*  MCV 98.3 98.5 96.9  PLT 114* 93* 73*   Basic Metabolic Panel: Recent Labs  Lab 03/10/20 0732 03/15/20 1113 03/16/20 0259  NA 140 136 135  K 4.3 3.5 3.4*  CL 109 109 111  CO2 21* 18* 16*  GLUCOSE 94 100* 74  BUN 15 22 21  CREATININE 0.75 0.98 0.88  CALCIUM 9.5 8.4* 8.0*   GFR: Estimated Creatinine Clearance: 84.7 mL/min (by C-G formula based on SCr of 0.88 mg/dL). Liver Function Tests: Recent Labs  Lab 03/10/20 0732 03/15/20 1138  AST 22 20  ALT 26 20  ALKPHOS 49 37*  BILITOT 1.1 1.5*  PROT 5.9* 5.6*  ALBUMIN 3.4* 3.2*   No results for input(s): LIPASE, AMYLASE in the last 168 hours. No results for input(s): AMMONIA in the last 168 hours. Coagulation Profile: Recent Labs  Lab 03/15/20 1334  INR 1.9*   Cardiac Enzymes: No results for input(s): CKTOTAL, CKMB, CKMBINDEX, TROPONINI in the last 168 hours. BNP (last 3 results) No results for input(s): PROBNP in the last 8760 hours. HbA1C: No results for input(s): HGBA1C in the last 72 hours. CBG: Recent Labs  Lab 03/15/20 1206  GLUCAP 91   Lipid Profile: No results for input(s): CHOL, HDL, LDLCALC, TRIG, CHOLHDL, LDLDIRECT in the last 72 hours. Thyroid Function Tests: Recent Labs    03/15/20 1711  TSH 0.929   Anemia Panel: No results for input(s): VITAMINB12, FOLATE, FERRITIN, TIBC, IRON, RETICCTPCT in the last 72 hours. Sepsis Labs: Recent Labs  Lab 03/15/20 1156 03/15/20 1334 03/15/20 1711 03/16/20 0259  PROCALCITON  --   --  2.19 4.28  LATICACIDVEN 2.8* 2.2*  --   --     Recent Results (from the past 240 hour(s))  SARS Coronavirus 2 by RT PCR (hospital order, performed in [place] hospital lab) Nasopharyngeal Nasopharyngeal Swab     Status: None   Collection Time: 03/15/20  2:39 PM   Specimen: Nasopharyngeal Swab  Result Value Ref Range Status   SARS Coronavirus 2 NEGATIVE NEGATIVE Final    Comment: (NOTE) SARS-CoV-2 target nucleic acids are NOT DETECTED.  The  SARS-CoV-2 RNA is generally detectable in upper and lower respiratory specimens during the acute phase of infection. The lowest concentration of SARS-CoV-2 viral copies this assay can detect is 250 copies / mL. A negative result does not preclude SARS-CoV-2 infection and should not be used as the sole basis for treatment or other patient management decisions.  A negative result may occur with improper specimen collection / handling, submission of specimen other than nasopharyngeal swab, presence of viral mutation(s) within the areas targeted by this assay, and inadequate number of viral copies (<250 copies / mL). A negative result must be combined with clinical observations, patient history, and epidemiological information.  Fact Sheet for Patients:   https://www.fda.gov/media/136312/download  Fact Sheet for Healthcare Providers: https://www.fda.gov/media/136313/download  This test is not yet approved or  cleared by the [place] FDA and has been authorized for detection and/or diagnosis of SARS-CoV-2 by FDA   under an Emergency Use Authorization (EUA).  This EUA will remain in effect (meaning this test can be used) for the duration of the COVID-19 declaration under Section 564(b)(1) of the Act, 21 U.S.C. section 360bbb-3(b)(1), unless the authorization is terminated or revoked sooner.  Performed at [facility], [address] [place], [place] [address]   Urine culture     Status: None   Collection Time: 03/15/20  2:45 PM   Specimen: In/Out Cath Urine  Result Value Ref Range Status   Specimen Description IN/OUT CATH URINE  Final   Special Requests NONE  Final   Culture   Final    NO GROWTH Performed at [facility], [address] [place], [place] [address]    Report Status 03/16/2020 FINAL  Final  C Difficile Quick Screen w PCR reflex     Status: None   Collection Time: 03/16/20  5:43 AM   Specimen: STOOL  Result Value Ref Range Status   C Diff antigen  NEGATIVE NEGATIVE Final   C Diff toxin NEGATIVE NEGATIVE Final   C Diff interpretation No C. difficile detected.  Final    Comment: Performed at [facility], [address] [place], [place] [address]         Radiology Studies: DG Chest 2 View  Result Date: 03/15/2020 CLINICAL DATA:  Status post fall EXAM: CHEST - 2 VIEW COMPARISON:  June 06, 2017 FINDINGS: Mediastinal contour and cardiac silhouette are stable. There is no focal infiltrate, pulmonary edema, or pleural effusion. Bony structures are stable. IMPRESSION: No active cardiopulmonary disease. Electronically Signed   By: Wei-Chen  Lin M.D.   On: 03/15/2020 13:36   CT Head Wo Contrast  Result Date: 03/15/2020 CLINICAL DATA:  Head trauma with headache EXAM: CT HEAD WITHOUT CONTRAST TECHNIQUE: Contiguous axial images were obtained from the base of the skull through the vertex without intravenous contrast. COMPARISON:  April 28, 2013 FINDINGS: Brain: No evidence of acute infarction, hemorrhage, hydrocephalus, extra-axial collection or mass lesion/mass effect. Signs of atrophy and mild chronic microvascular ischemic change Vascular: No hyperdense vessel or unexpected calcification. Skull: Normal. Negative for fracture or focal lesion. Sinuses/Orbits: No acute finding. Other: Scalp hematoma in the occipital parietal region in the midline. IMPRESSION: 1. No acute intracranial abnormality. 2. Scalp hematoma in the occipital parietal region in the midline of the subcutaneous, superficial scalp. Electronically Signed   By: Geoffrey  Wile M.D.   On: 03/15/2020 12:55   CT ABDOMEN PELVIS W CONTRAST  Result Date: 03/15/2020 CLINICAL DATA:  Abdominal pain and diarrhea for several days EXAM: CT ABDOMEN AND PELVIS WITH CONTRAST TECHNIQUE: Multidetector CT imaging of the abdomen and pelvis was performed using the standard protocol following bolus administration of intravenous contrast. CONTRAST:  125mL OMNIPAQUE IOHEXOL 300 MG/ML  SOLN  COMPARISON:  02/08/2018 FINDINGS: Lower chest: Mild bibasilar atelectasis is noted with minimal effusions bilaterally. Hepatobiliary: No focal liver abnormality is seen. No gallstones, gallbladder wall thickening, or biliary dilatation. Pancreas: Unremarkable. No pancreatic ductal dilatation or surrounding inflammatory changes. Spleen: Normal in size without focal abnormality. Adrenals/Urinary Tract: Adrenal glands are within normal limits. Kidneys demonstrate a normal enhancement pattern bilaterally. No renal calculi are seen. Small left renal cyst is noted. Normal excretion of contrast is noted bilaterally. The bladder is well distended. Stomach/Bowel: Diverticulosis of the colon is noted without evidence of diverticulitis. No obstructive or inflammatory changes are seen. The appendix is barium filled. Small bowel and stomach appear within normal limits. Vascular/Lymphatic:   Aortic atherosclerosis. No enlarged abdominal or pelvic lymph nodes. Reproductive: Prostate is unremarkable. Other: No abdominal wall hernia or abnormality. No abdominopelvic ascites. Musculoskeletal: Degenerative changes of the thoracolumbar spine are noted. IMPRESSION: Diverticulosis without diverticulitis. Mild bibasilar atelectasis with small effusions. No other focal abnormality is noted. Electronically Signed   By: Mark  Lukens M.D.   On: 03/15/2020 23:19   DG Knee Complete 4 Views Left  Result Date: 03/15/2020 CLINICAL DATA:  Status post fall with knee pain. EXAM: LEFT KNEE - COMPLETE 4+ VIEW COMPARISON:  None. FINDINGS: No acute fracture or dislocation is noted. Degenerative joint changes with narrowed joint space and osteophyte formation are noted. IMPRESSION: No acute fracture or dislocation. Electronically Signed   By: Wei-Chen  Lin M.D.   On: 03/15/2020 13:38   DG Knee Complete 4 Views Right  Result Date: 03/15/2020 CLINICAL DATA:  Status post fall with pain EXAM: RIGHT KNEE - COMPLETE 4+ VIEW COMPARISON:  May 07, 2019  FINDINGS: Degenerative joint changes of the right knee with narrowed joint space and osteophyte formation are identified. There is no acute fracture or dislocation. IMPRESSION: Degenerative joint changes of right knee. No acute fracture or dislocation noted. Electronically Signed   By: Wei-Chen  Lin M.D.   On: 03/15/2020 13:37   DG Femur Min 2 Views Left  Result Date: 03/15/2020 CLINICAL DATA:  Status post fall with weakness of bilateral lower extremities. EXAM: LEFT FEMUR 2 VIEWS COMPARISON:  None. FINDINGS: No acute fracture or dislocation is noted. Degenerative joint changes of the left hip and left knee are identified with narrowed joint space and osteophyte formation. IMPRESSION: No acute fracture or dislocation. Electronically Signed   By: Wei-Chen  Lin M.D.   On: 03/15/2020 13:38        Scheduled Meds: . acyclovir  200 mg Oral BID  . atorvastatin  10 mg Oral q1800  . finasteride  5 mg Oral Daily  . magnesium oxide  200 mg Oral Daily  . multivitamin with minerals  1 tablet Oral Daily  . mupirocin ointment  1 application Topical Daily  . omega-3 acid ethyl esters  1 g Oral Daily  . potassium chloride  40 mEq Oral Once  . rivaroxaban  20 mg Oral Q supper  . sodium chloride flush  3 mL Intravenous Once  . thiamine  100 mg Oral Daily   Continuous Infusions: . sodium chloride 100 mL/hr at 03/16/20 0556  . ceFEPime (MAXIPIME) IV 2 g (03/16/20 0553)  . vancomycin 750 mg (03/16/20 0029)           , MD [facility] 03/16/2020, 10:59 AM   

## 2020-03-16 NOTE — Progress Notes (Signed)
  Echocardiogram 2D Echocardiogram has been performed with [facility].  [name] 03/16/2020, 9:37 AM

## 2020-03-17 ENCOUNTER — Inpatient Hospital Stay: Payer: Medicare HMO

## 2020-03-17 ENCOUNTER — Inpatient Hospital Stay: Payer: Medicare HMO | Admitting: Internal Medicine

## 2020-03-17 DIAGNOSIS — A419 Sepsis, unspecified organism: Principal | ICD-10-CM

## 2020-03-17 DIAGNOSIS — R062 Wheezing: Secondary | ICD-10-CM

## 2020-03-17 DIAGNOSIS — R6521 Severe sepsis with septic shock: Secondary | ICD-10-CM

## 2020-03-17 LAB — COMPREHENSIVE METABOLIC PANEL
ALT: 27 U/L (ref 0–44)
AST: 67 U/L — ABNORMAL HIGH (ref 15–41)
Albumin: 2.4 g/dL — ABNORMAL LOW (ref 3.5–5.0)
Alkaline Phosphatase: 34 U/L — ABNORMAL LOW (ref 38–126)
Anion gap: 9 (ref 5–15)
BUN: 18 mg/dL (ref 8–23)
CO2: 16 mmol/L — ABNORMAL LOW (ref 22–32)
Calcium: 8 mg/dL — ABNORMAL LOW (ref 8.9–10.3)
Chloride: 112 mmol/L — ABNORMAL HIGH (ref 98–111)
Creatinine, Ser: 0.98 mg/dL (ref 0.61–1.24)
GFR calc Af Amer: >60 mL/min (ref >60)
GFR calc non Af Amer: >60 mL/min (ref >60)
Glucose, Bld: 79 mg/dL (ref 70–99)
Potassium: 3.3 mmol/L — ABNORMAL LOW (ref 3.5–5.1)
Sodium: 137 mmol/L (ref 135–145)
Total Bilirubin: 1.7 mg/dL — ABNORMAL HIGH (ref 0.3–1.2)
Total Protein: 4.9 g/dL — ABNORMAL LOW (ref 6.5–8.1)

## 2020-03-17 LAB — CBC WITH DIFFERENTIAL/PLATELET
Abs Immature Granulocytes: 0.07 10*3/uL (ref 0.00–0.07)
Basophils Absolute: 0 10*3/uL (ref 0.0–0.1)
Basophils Relative: 0 %
Eosinophils Absolute: 0.4 10*3/uL (ref 0.0–0.5)
Eosinophils Relative: 4 %
HCT: 30.7 % — ABNORMAL LOW (ref 39.0–52.0)
Hemoglobin: 10.3 g/dL — ABNORMAL LOW (ref 13.0–17.0)
Immature Granulocytes: 1 %
Lymphocytes Relative: 13 %
Lymphs Abs: 1.2 10*3/uL (ref 0.7–4.0)
MCH: 32.8 pg (ref 26.0–34.0)
MCHC: 33.6 g/dL (ref 30.0–36.0)
MCV: 97.8 fL (ref 80.0–100.0)
Monocytes Absolute: 2.1 10*3/uL — ABNORMAL HIGH (ref 0.1–1.0)
Monocytes Relative: 23 %
Neutro Abs: 5.4 10*3/uL (ref 1.7–7.7)
Neutrophils Relative %: 59 %
Platelets: 82 10*3/uL — ABNORMAL LOW (ref 150–400)
RBC: 3.14 MIL/uL — ABNORMAL LOW (ref 4.22–5.81)
RDW: 13.4 % (ref 11.5–15.5)
WBC: 9.2 10*3/uL (ref 4.0–10.5)
nRBC: 0.2 % (ref 0.0–0.2)

## 2020-03-17 LAB — PROCALCITONIN: Procalcitonin: 3.74 ng/mL

## 2020-03-17 LAB — MAGNESIUM: Magnesium: 1.7 mg/dL (ref 1.7–2.4)

## 2020-03-17 MED ORDER — GERHARDT'S BUTT CREAM
TOPICAL_CREAM | Freq: Four times a day (QID) | CUTANEOUS | Status: DC
  Administered 2020-03-18 – 2020-03-24 (×6): 1 via TOPICAL
  Filled 2020-03-17 (×3): qty 1

## 2020-03-17 MED ORDER — LOPERAMIDE HCL 2 MG PO CAPS
2.0000 mg | ORAL_CAPSULE | Freq: Four times a day (QID) | ORAL | Status: DC | PRN
  Administered 2020-03-18 – 2020-03-22 (×5): 2 mg via ORAL
  Filled 2020-03-17 (×5): qty 1

## 2020-03-17 MED ORDER — POTASSIUM CHLORIDE CRYS ER 20 MEQ PO TBCR
40.0000 meq | EXTENDED_RELEASE_TABLET | Freq: Once | ORAL | Status: AC
  Administered 2020-03-17: 40 meq via ORAL
  Filled 2020-03-17: qty 2

## 2020-03-17 NOTE — Progress Notes (Addendum)
HEMATOLOGY-ONCOLOGY PROGRESS NOTE  SUBJECTIVE: Reports ongoing generalized weakness.  He reports pain to his neck.  Denies fevers and chills.  Denies nausea, vomiting, abdominal pain.  Still having loose stools.  No family at bedside.  Therapy is recommending SNF upon discharge for rehabilitation.  Oncology History  Multiple myeloma ([place])  07/21/2010 Initial Diagnosis   Multiple myeloma ([place])   01/14/2020 -  Chemotherapy   The patient had bortezomib SQ (VELCADE) chemo injection 3 mg, 1.3 mg/m2 = 3 mg, Subcutaneous,  Once, 3 of 4 cycles Administration: 3 mg (01/14/2020), 3 mg (01/21/2020), 3 mg (01/28/2020), 3 mg (02/04/2020), 3 mg (02/12/2020), 3 mg (02/18/2020), 3 mg (02/25/2020), 3 mg (03/03/2020), 3 mg (03/10/2020)  for chemotherapy treatment.       REVIEW OF SYSTEMS:   Constitutional: Denies fevers, chills  Eyes: Denies blurriness of vision Ears, nose, mouth, throat, and face: Denies mucositis or sore throat Respiratory: Denies cough, dyspnea or wheezes Cardiovascular: Denies palpitation, chest discomfort Gastrointestinal: Denies nausea vomiting.  He has diarrhea. Skin: Denies abnormal skin rashes Lymphatics: Denies new lymphadenopathy or easy bruising Neurological:Denies numbness, tingling or new weaknesses MSK: Reports neck pain Behavioral/Psych: Mood is stable, no new changes  Extremities: Has ongoing lower extremity edema All other systems were reviewed with the patient and are negative.  I have reviewed the past medical history, past surgical history, social history and family history with the patient and they are unchanged from previous note.   PHYSICAL EXAMINATION: ECOG PERFORMANCE STATUS: 2 - Symptomatic, <50% confined to bed  Vitals:   03/17/20 0835 03/17/20 1224  BP:  (!) 101/49  Pulse: 68 70  Resp: 18 20  Temp:  98.1 F (36.7 C)  SpO2: 98% 96%   Filed Weights   03/15/20 1159 03/16/20 0424 03/17/20 0237  Weight: 101.2 kg 114.1 kg 107 kg    Intake/Output from  previous day: 07/05 0701 - 07/06 0700 In: 720 [P.O.:720] Out: 900 [Urine:900]  GENERAL:alert, no distress and comfortable SKIN: skin color, texture, turgor are normal, no rashes or significant lesions EYES: normal, Conjunctiva are pink and non-injected, sclera clear OROPHARYNX:no exudate, no erythema and lips, buccal mucosa, and tongue normal  LUNGS: clear to auscultation and percussion with normal breathing effort HEART: regular rate & rhythm and no murmurs, 1+ bilateral lower extremity edema ABDOMEN:abdomen soft, non-tender and normal bowel sounds Musculoskeletal:no cyanosis of digits and no clubbing  NEURO: Alert  LABORATORY DATA:  I have reviewed the data as listed CMP Latest Ref Rng & Units 03/17/2020 03/16/2020 03/15/2020  Glucose 70 - 99 mg/dL 79 74 100(H)  BUN 8 - 23 mg/dL 18 21 22   Creatinine 0.61 - 1.24 mg/dL 0.98 0.88 0.98  Sodium 135 - 145 mmol/L 137 135 136  Potassium 3.5 - 5.1 mmol/L 3.3(L) 3.4(L) 3.5  Chloride 98 - 111 mmol/L 112(H) 111 109  CO2 22 - 32 mmol/L 16(L) 16(L) 18(L)  Calcium 8.9 - 10.3 mg/dL 8.0(L) 8.0(L) 8.4(L)  Total Protein 6.5 - 8.1 g/dL 4.9(L) - 5.6(L)  Total Bilirubin 0.3 - 1.2 mg/dL 1.7(H) - 1.5(H)  Alkaline Phos 38 - 126 U/L 34(L) - 37(L)  AST 15 - 41 U/L 67(H) - 20  ALT 0 - 44 U/L 27 - 20    Lab Results  Component Value Date   WBC 9.2 03/17/2020   HGB 10.3 (L) 03/17/2020   HCT 30.7 (L) 03/17/2020   MCV 97.8 03/17/2020   PLT 82 (L) 03/17/2020   NEUTROABS 5.4 03/17/2020    DG Chest 2  View  Result Date: 03/15/2020 CLINICAL DATA:  Status post fall EXAM: CHEST - 2 VIEW COMPARISON:  June 06, 2017 FINDINGS: Mediastinal contour and cardiac silhouette are stable. There is no focal infiltrate, pulmonary edema, or pleural effusion. Bony structures are stable. IMPRESSION: No active cardiopulmonary disease. Electronically Signed   By: Abelardo Diesel M.D.   On: 03/15/2020 13:36   CT Head Wo Contrast  Result Date: 03/15/2020 CLINICAL DATA:  Head  trauma with headache EXAM: CT HEAD WITHOUT CONTRAST TECHNIQUE: Contiguous axial images were obtained from the base of the skull through the vertex without intravenous contrast. COMPARISON:  April 28, 2013 FINDINGS: Brain: No evidence of acute infarction, hemorrhage, hydrocephalus, extra-axial collection or mass lesion/mass effect. Signs of atrophy and mild chronic microvascular ischemic change Vascular: No hyperdense vessel or unexpected calcification. Skull: Normal. Negative for fracture or focal lesion. Sinuses/Orbits: No acute finding. Other: Scalp hematoma in the occipital parietal region in the midline. IMPRESSION: 1. No acute intracranial abnormality. 2. Scalp hematoma in the occipital parietal region in the midline of the subcutaneous, superficial scalp. Electronically Signed   By: Zetta Bills M.D.   On: 03/15/2020 12:55   CT ABDOMEN PELVIS W CONTRAST  Result Date: 03/15/2020 CLINICAL DATA:  Abdominal pain and diarrhea for several days EXAM: CT ABDOMEN AND PELVIS WITH CONTRAST TECHNIQUE: Multidetector CT imaging of the abdomen and pelvis was performed using the standard protocol following bolus administration of intravenous contrast. CONTRAST:  115m OMNIPAQUE IOHEXOL 300 MG/ML  SOLN COMPARISON:  02/08/2018 FINDINGS: Lower chest: Mild bibasilar atelectasis is noted with minimal effusions bilaterally. Hepatobiliary: No focal liver abnormality is seen. No gallstones, gallbladder wall thickening, or biliary dilatation. Pancreas: Unremarkable. No pancreatic ductal dilatation or surrounding inflammatory changes. Spleen: Normal in size without focal abnormality. Adrenals/Urinary Tract: Adrenal glands are within normal limits. Kidneys demonstrate a normal enhancement pattern bilaterally. No renal calculi are seen. Small left renal cyst is noted. Normal excretion of contrast is noted bilaterally. The bladder is well distended. Stomach/Bowel: Diverticulosis of the colon is noted without evidence of  diverticulitis. No obstructive or inflammatory changes are seen. The appendix is barium filled. Small bowel and stomach appear within normal limits. Vascular/Lymphatic: Aortic atherosclerosis. No enlarged abdominal or pelvic lymph nodes. Reproductive: Prostate is unremarkable. Other: No abdominal wall hernia or abnormality. No abdominopelvic ascites. Musculoskeletal: Degenerative changes of the thoracolumbar spine are noted. IMPRESSION: Diverticulosis without diverticulitis. Mild bibasilar atelectasis with small effusions. No other focal abnormality is noted. Electronically Signed   By: MInez CatalinaM.D.   On: 03/15/2020 23:19   DG Knee Complete 4 Views Left  Result Date: 03/15/2020 CLINICAL DATA:  Status post fall with knee pain. EXAM: LEFT KNEE - COMPLETE 4+ VIEW COMPARISON:  None. FINDINGS: No acute fracture or dislocation is noted. Degenerative joint changes with narrowed joint space and osteophyte formation are noted. IMPRESSION: No acute fracture or dislocation. Electronically Signed   By: WAbelardo DieselM.D.   On: 03/15/2020 13:38   DG Knee Complete 4 Views Right  Result Date: 03/15/2020 CLINICAL DATA:  Status post fall with pain EXAM: RIGHT KNEE - COMPLETE 4+ VIEW COMPARISON:  May 07, 2019 FINDINGS: Degenerative joint changes of the right knee with narrowed joint space and osteophyte formation are identified. There is no acute fracture or dislocation. IMPRESSION: Degenerative joint changes of right knee. No acute fracture or dislocation noted. Electronically Signed   By: WAbelardo DieselM.D.   On: 03/15/2020 13:37   ECHOCARDIOGRAM COMPLETE  Result Date: 03/16/2020  ECHOCARDIOGRAM REPORT   Patient Name:   [name] Date of Exam: 03/16/2020 Medical Rec #:  [id number]      Height:       70.0 in Accession #:    [id number]     Weight:       251.5 lb Date of Birth:  03/11/1941      BSA:          2.301 m Patient Age:    83 years       BP:           96/53 mmHg Patient Gender: M              HR:            70 bpm. Exam Location:  Inpatient Procedure: 2D Echo, Cardiac Doppler, Color Doppler and Intracardiac            Opacification Agent Indications:    Hypotension  History:        Patient has prior history of Echocardiogram examinations, most                 recent 06/07/2018. CAD, Pacemaker, Arrythmias:Atrial                 Fibrillation; Risk Factors:Former Smoker, Sleep Apnea and                 Hypertension.  Sonographer:    Clayton Lefort RDCS (AE) Referring Phys: [id number] Lequita Halt  Sonographer Comments: Limited patient mobility. IMPRESSIONS  1. Left ventricular ejection fraction, by estimation, is 55 to 60%. The left ventricle has normal function. The left ventricle has no regional wall motion abnormalities. There is mild left ventricular hypertrophy. Left ventricular diastolic parameters are indeterminate.  2. Right ventricular systolic function is normal. The right ventricular size is normal. There is normal pulmonary artery systolic pressure. The estimated right ventricular systolic pressure is 42.3 mmHg.  3. The mitral valve is grossly normal, mildly calcified annulus. Trivial mitral valve regurgitation.  4. The aortic valve is tricuspid. Aortic valve regurgitation is not visualized. Mild to moderate aortic valve sclerosis/calcification is present, without any evidence of aortic stenosis. Aortic valve mean gradient measures 3.0 mmHg.  5. The inferior vena cava is dilated in size with >50% respiratory variability, suggesting right atrial pressure of 8 mmHg. FINDINGS  Left Ventricle: Left ventricular ejection fraction, by estimation, is 55 to 60%. The left ventricle has normal function. The left ventricle has no regional wall motion abnormalities. Definity contrast agent was given IV to delineate the left ventricular  endocardial borders. The left ventricular internal cavity size was normal in size. There is mild left ventricular hypertrophy. Left ventricular diastolic parameters are indeterminate. Right  Ventricle: The right ventricular size is normal. No increase in right ventricular wall thickness. Right ventricular systolic function is normal. There is normal pulmonary artery systolic pressure. The tricuspid regurgitant velocity is 2.55 m/s, and  with an assumed right atrial pressure of 8 mmHg, the estimated right ventricular systolic pressure is 53.6 mmHg. Left Atrium: Left atrial size was normal in size. Right Atrium: Right atrial size was normal in size. Pericardium: A small pericardial effusion is present. The pericardial effusion is posterior to the left ventricle. Presence of pericardial fat pad. Mitral Valve: The mitral valve is grossly normal. Mild mitral annular calcification. Trivial mitral valve regurgitation. Tricuspid Valve: The tricuspid valve is grossly normal. Tricuspid valve regurgitation is mild. Aortic Valve: The aortic valve is tricuspid. Aortic valve regurgitation  is not visualized. Mild to moderate aortic valve sclerosis/calcification is present, without any evidence of aortic stenosis. Mild aortic valve annular calcification. Aortic valve mean gradient measures 3.0 mmHg. Aortic valve peak gradient measures 5.5 mmHg. Aortic valve area, by VTI measures 2.96 cm. Pulmonic Valve: The pulmonic valve was not well visualized. Pulmonic valve regurgitation is not visualized. Aorta: The aortic root is normal in size and structure. Venous: The inferior vena cava is dilated in size with greater than 50% respiratory variability, suggesting right atrial pressure of 8 mmHg. IAS/Shunts: No atrial level shunt detected by color flow Doppler. Additional Comments: A pacer wire is visualized.  LEFT VENTRICLE PLAX 2D LVIDd:         4.10 cm LVIDs:         2.90 cm LV PW:         1.20 cm LV IVS:        1.20 cm LVOT diam:     2.20 cm LV SV:         75 LV SV Index:   33 LVOT Area:     3.80 cm  IVC IVC diam: 2.20 cm LEFT ATRIUM           Index       RIGHT ATRIUM           Index LA diam:      4.30 cm 1.87 cm/m  RA  Area:     27.80 cm LA Vol (A2C): 62.0 ml 26.95 ml/m RA Volume:   92.90 ml  40.38 ml/m LA Vol (A4C): 52.1 ml 22.64 ml/m  AORTIC VALVE AV Area (Vmax):    2.91 cm AV Area (Vmean):   2.75 cm AV Area (VTI):     2.96 cm AV Vmax:           117.00 cm/s AV Vmean:          82.860 cm/s AV VTI:            0.253 m AV Peak Grad:      5.5 mmHg AV Mean Grad:      3.0 mmHg LVOT Vmax:         89.50 cm/s LVOT Vmean:        59.920 cm/s LVOT VTI:          0.197 m LVOT/AV VTI ratio: 0.78  AORTA Ao Root diam: 3.50 cm Ao Asc diam:  3.70 cm TRICUSPID VALVE TR Peak grad:   26.0 mmHg TR Vmax:        255.00 cm/s  SHUNTS Systemic VTI:  0.20 m Systemic Diam: 2.20 cm Rozann Lesches MD Electronically signed by Rozann Lesches MD Signature Date/Time: 03/16/2020/11:33:34 AM    Final    DG Femur Min 2 Views Left  Result Date: 03/15/2020 CLINICAL DATA:  Status post fall with weakness of bilateral lower extremities. EXAM: LEFT FEMUR 2 VIEWS COMPARISON:  None. FINDINGS: No acute fracture or dislocation is noted. Degenerative joint changes of the left hip and left knee are identified with narrowed joint space and osteophyte formation. IMPRESSION: No acute fracture or dislocation. Electronically Signed   By: Abelardo Diesel M.D.   On: 03/15/2020 13:38    ASSESSMENT AND PLAN: This is a very pleasant 79 year old white male with a history of small for multiple myeloma diagnosed in August 2011 and has been on observation since that time.  His myeloma panel showed continued increase in his IgM as well as his free lambda light chain.  The patient was given  the option of continued observation versus treatment with weekly subcutaneous Velcade, Revlimid, and dexamethasone and the patient opted to proceed with treatment.  Plan was for 4 cycles of chemotherapy and then return back to observation.  He has completed 3 cycles of his chemotherapy and was due to start cycle #4 today.  He is now admitted for probable sepsis-unclear source.  IV fluids and  vancomycin are being discontinued today.  He will remain on cefepime for 1 more day.  Cultures remain negative.  Discussed with the patient that we will hold his chemotherapy until he recovers from his acute illness.  We will arrange for outpatient follow-up at the cancer center following discharge for reevaluation prior to starting cycle #4 of chemotherapy.  The patient is overall weak and would benefit from SNF placement for rehabilitation.   LOS: 2 days   [name] Bussing, DNP, AGPCNP-BC, AOCNP 03/17/20  ADDENDUM: Hematology/Oncology Attending: I had a face-to-face encounter with the patient today.  I agree with the above note and I recommended his care plan.  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  The patient has been tolerating this treatment well with no concerning adverse effects and he is status post 3 cycles.  He was supposed to start cycle #4 next week. The patient was admitted with sepsis of unclear etiology.  He was treated with vancomycin and cefepime.  Vancomycin was discontinued and he is currently on the last dose of cefepime.  The patient continues to complain of increasing fatigue and weakness.  He is expected to be discharged to a skilled nursing facility for rehabilitation. I had a lengthy discussion with the patient today about his condition and further treatment options. I strongly recommend for the patient to discontinue his systemic chemotherapy for the multiple myeloma at this point.  I will arrange for him a follow-up visit with me in few weeks after his discharge for evaluation and repeat myeloma panel. Thank you so much for taking good care of Mr. [place], please call if you have any concerning questions.  Disclaimer: This note was dictated with voice recognition software. Similar  sounding words can inadvertently be transcribed and may be missed upon review. Eilleen Kempf, MD

## 2020-03-17 NOTE — Evaluation (Signed)
Occupational Therapy Evaluation Patient Details Name: [name] MRN: [id number] DOB: 01/13/1941 Today's Date: 03/17/2020    History of Present Illness 79 y.o. male with medical history significant of multiple myeloma on chemotherapy, CAD, A. fib, hypertension presented with generalized weakness and increasing leg pain. Presents to ED after 2 falls in the last 2 days and c/o of chronic diarrhea. Admitted 03/15/20 for treatment of sepsis and weakness.   Clinical Impression   PTA: Pt living with family and assisted as needed for ADL and mobility. Pt currently limited by decreased strength, decreased ability to care for self and decreased ability to mobilize due to pain. Pt with cognitive impairments present and pt with severe pain "all over" inhibiting further mobility/ADL. Pt with fair ROM in BUEs and BLEs. Pt maxA to totalA for ADL; pt totalA for bed mobility at this time. Pt would benefit from continued OT skilled services for ADL, mobility. OT following acutely.       Follow Up Recommendations  SNF;Supervision/Assistance - 24 hour    Equipment Recommendations  Other (comment) (defer to next facility)    Recommendations for Other Services       Precautions / Restrictions Precautions Precautions: Fall Precaution Comments: fall prior to hospitalization  Restrictions Weight Bearing Restrictions: No      Mobility Bed Mobility Overal bed mobility: Needs Assistance Bed Mobility: Rolling Rolling: Total assist         General bed mobility comments: Pt unable to fully roll without +2 assist  Transfers                 General transfer comment: unable to perform due to fatigue    Balance                                           ADL either performed or assessed with clinical judgement   ADL Overall ADL's : Needs assistance/impaired Eating/Feeding: Maximal assistance   Grooming: Maximal assistance   Upper Body Bathing: Total assistance    Lower Body Bathing: Total assistance   Upper Body Dressing : Total assistance   Lower Body Dressing: Total assistance   Toilet Transfer: Total assistance;+2 for physical assistance;+2 for safety/equipment   Toileting- Clothing Manipulation and Hygiene: Total assistance;+2 for physical assistance;+2 for safety/equipment       Functional mobility during ADLs: Total assistance General ADL Comments: Pt limited by decreased strength, decreased ability to care for self  and decreased ability to mobilize due to pain.      Vision Baseline Vision/History: Wears glasses Wears Glasses: At all times Patient Visual Report: No change from baseline Vision Assessment?: No apparent visual deficits     Perception     Praxis      Pertinent Vitals/Pain Pain Assessment: 0-10 Pain Score: 5  Pain Location: "all over" Pain Descriptors / Indicators: Aching;Sore Pain Intervention(s): Monitored during session;Limited activity within patient's tolerance     Hand Dominance Right   Extremity/Trunk Assessment Upper Extremity Assessment Upper Extremity Assessment: Generalized weakness;RUE deficits/detail;LUE deficits/detail RUE Deficits / Details: ROM to 130* FF, elbow through hand ROM, WFL strength 3 to 3-/5 MM grade; poor grip strength RUE Coordination: decreased gross motor;decreased fine motor LUE Deficits / Details: ROM to 130* FF, elbow through hand ROM, WFL strength 3 to 3-/5 MM grade; poor grip strength LUE Coordination: decreased fine motor;decreased gross motor   Lower Extremity Assessment Lower Extremity  Assessment: Generalized weakness RLE Deficits / Details: lacking full knee extension; minimal dorsiflexion and toe mobility LLE Deficits / Details: lacking full knee extension; minimal dorsiflexion and toe mobility       Communication Communication Communication: HOH   Cognition Arousal/Alertness: Awake/alert Behavior During Therapy: Flat affect Overall Cognitive Status:  Impaired/Different from baseline Area of Impairment: Following commands;Awareness                       Following Commands: Follows one step commands with increased time;Follows multi-step commands with increased time   Awareness: Emergent   General Comments: pt requires increased time for initiation of movement    General Comments  stage 2 pressure ulcer on sacrum; pt on supplemental O2    Exercises Exercises: Other exercises Other Exercises Other Exercises: knee extension, ankle pumps, toe flex/ext Other Exercises: shoulder flex, elbow flex/ext, hand squeeze   Shoulder Instructions      Home Living Family/patient expects to be discharged to:: Private residence Living Arrangements: Spouse/significant other Available Help at Discharge: Family;Available 24 hours/day Type of Home: House Home Access: Stairs to enter [employer] of Steps: 4 Entrance Stairs-Rails: Right;Left Home Layout: Able to live on main level with bedroom/bathroom     Bathroom Shower/Tub: Occupational psychologist: Standard Bathroom Accessibility: No   Home Equipment: Cane - single point;Wheelchair - [employer] - 2 wheels          Prior Functioning/Environment Level of Independence: Needs assistance  Gait / Transfers Assistance Needed: reports ambulation household distances with cane ADL's / Homemaking Assistance Needed: independent with bathing, wife assists with lower body dressing and iADLS            OT Problem List: Decreased strength;Decreased activity tolerance;Impaired balance (sitting and/or standing);Decreased safety awareness;Cardiopulmonary status limiting activity;Pain;Increased edema;Impaired UE functional use      OT Treatment/Interventions: Self-care/ADL training;Therapeutic exercise;Energy conservation;DME and/or AE instruction;Therapeutic activities;Visual/perceptual remediation/compensation;Patient/family education;Balance training;Cognitive  remediation/compensation    OT Goals(Current goals can be found in the care plan section) Acute Rehab OT Goals Patient Stated Goal: have less pain OT Goal Formulation: With patient Time For Goal Achievement: 03/31/20 Potential to Achieve Goals: Good ADL Goals Pt Will Perform Grooming: with min assist;sitting Pt Will Perform Upper Body Dressing: with min assist;sitting Pt Will Transfer to Toilet: with max assist;squat pivot transfer;bedside commode Pt/caregiver will Perform Home Exercise Program: Increased strength;Both right and left upper extremity Additional ADL Goal #1: Pt will increase to modA for bed mobility as precursor for ADL functional mobility.  OT Frequency: Min 2X/week   Barriers to D/C:            Co-evaluation              AM-PAC OT "6 Clicks" Daily Activity     Outcome Measure Help from another person eating meals?: A Lot Help from another person taking care of personal grooming?: A Lot Help from another person toileting, which includes using toliet, bedpan, or urinal?: Total Help from another person bathing (including washing, rinsing, drying)?: Total Help from another person to put on and taking off regular upper body clothing?: Total Help from another person to put on and taking off regular lower body clothing?: Total 6 Click Score: 8   End of Session Equipment Utilized During Treatment: Oxygen Nurse Communication: Mobility status  Activity Tolerance: Patient limited by fatigue;Patient limited by lethargy;Patient limited by pain Patient left: in bed;with call bell/phone within reach;with bed alarm set  OT Visit Diagnosis: Unsteadiness  on feet (R26.81);Muscle weakness (generalized) (M62.81);Pain Pain - part of body:  ("all over")                Time: 1022-1035 OT Time Calculation (min): 13 min Charges:  OT General Charges $OT Visit: 1 Visit OT Evaluation $OT Eval Low Complexity: 1 Low  Jefferey Pica, OTR/L Acute Rehabilitation Services Pager:  [phone] Office: [place] [facility] 03/17/2020, 1:34 PM

## 2020-03-17 NOTE — NC FL2 (Signed)
[place] LEVEL OF CARE SCREENING TOOL     IDENTIFICATION  Patient Name: [name] Birthdate: [birth year] Sex: male Admission Date (Current Location): 03/15/2020  [facility] and [place] Number:  Herbalist and Address:  [place]. [facility], [place] [address], [place], [place] [address]      Provider Number: [id number]  Attending Physician Name and Address:  Aline August, MD  Relative Name and Phone Number:  Jenny Reichmann [phone]    Current Level of Care: Hospital Recommended Level of Care: [place] Prior Approval Number:    Date Approved/Denied:   PASRR Number: [id number]  Discharge Plan: SNF    Current Diagnoses: Patient Active Problem List   Diagnosis Date Noted  . Sepsis ([place]) 03/15/2020  . 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 Encounter for antineoplastic chemotherapy 01/03/2020  . Goals of care, counseling/discussion 01/03/2020  . A-fib ([place]) 09/11/2019  . Visit for monitoring Tikosyn therapy 06/05/2018  . Persistent atrial fibrillation ([place])   . Pneumothorax on right 05/19/2017  . Alcohol intoxication in active alcoholic ([place]) 56/38/9373  . Right-sided muscle weakness 04/28/2013  . 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 Coronary artery disease-nonobstructive catheter in October 2011 12/27/2010  . PACEMAKER, St. Jude dual-chamber 09/14/2010  . Multiple myeloma ([place]) 07/21/2010  . SMOKER 07/21/2010  . Obstructive sleep apnea 06/12/2009  . Atrioventricular block, heart high-grade 01/29/2009  . SICK SINUS/ TACHY-BRADY SYNDROME 01/29/2009  . Atrial tachycardia/fibrillation 01/14/2009  . SCIATICA 01/14/2009  . Essential hypertension 01/03/2009  . VENOUS INSUFFICIENCY 01/03/2009  . ALLERGY 01/03/2009    Orientation RESPIRATION BLADDER Height & Weight     Self, Time, Situation, Place  Normal Incontinent, External catheter (External Urinary Catheter) Weight: 235 lb 14.3  oz (107 kg) Height:  5' 10"  (177.8 cm)  BEHAVIORAL SYMPTOMS/MOOD NEUROLOGICAL BOWEL NUTRITION STATUS      Continent (Type 7 liquid consistency with no solid pieces) Diet (See Discharge Summary)  AMBULATORY STATUS COMMUNICATION OF NEEDS Skin   Total Care Verbally Surgical wounds, Other (Comment) (Appropriate for ethnicity, dry, pressure injury buttocks mid stage 1 , wound incision open or dehiced arm right;left)                       Personal Care Assistance Level of Assistance  Bathing, Feeding, Dressing Bathing Assistance: Maximum assistance Feeding assistance: Independent Dressing Assistance: Maximum assistance     Functional Limitations Info  Sight, Hearing, Speech Sight Info: Impaired (Drainage Right eye , Drainage left eye) Hearing Info: Impaired Speech Info: Adequate    SPECIAL CARE FACTORS FREQUENCY  PT (By licensed PT), OT (By licensed OT)     PT Frequency: 5x min weekly OT Frequency: 5x min weekly            Contractures Contractures Info: Not present    Additional Factors Info  Code Status, Allergies Code Status Info: FULL Allergies Info: Bee Venom           Current Medications (03/17/2020):  This is the current hospital active medication list Current Facility-Administered Medications  Medication Dose Route Frequency Provider Last Rate Last Admin  . acetaminophen (TYLENOL) tablet 650 mg  650 mg Oral Q6H PRN Wynetta Fines T, MD       Or  . acetaminophen (TYLENOL) suppository 650 mg  650 mg Rectal Q6H PRN Wynetta Fines T, MD      . acyclovir (ZOVIRAX) 200 MG capsule 200 mg  200 mg Oral BID  Lequita Halt, MD   200 mg at 03/17/20 0859  . atorvastatin (LIPITOR) tablet 10 mg  10 mg Oral q1800 Wynetta Fines T, MD   10 mg at 03/16/20 1759  . ceFEPIme (MAXIPIME) 2 g in sodium chloride 0.9 % 100 mL IVPB  2 g Intravenous Q8H Bertis Ruddy, RPH 200 mL/hr at 03/17/20 0545 2 g at 03/17/20 0545  . ciprofloxacin (CILOXAN) 0.3 % ophthalmic solution 1 drop  1 drop Both Eyes  Q4H while awake Alekh, Kshitiz, MD   1 drop at 03/17/20 0900  . finasteride (PROSCAR) tablet 5 mg  5 mg Oral Daily Wynetta Fines T, MD   5 mg at 03/17/20 0859  . Gerhardt's butt cream   Topical QID Starla Link, Kshitiz, MD      . ketotifen (ZADITOR) 0.025 % ophthalmic solution 1 drop  1 drop Both Eyes BID PRN Wynetta Fines T, MD      . loperamide (IMODIUM) capsule 2 mg  2 mg Oral Q6H PRN Starla Link, Kshitiz, MD      . magnesium oxide (MAG-OX) tablet 200 mg  200 mg Oral Daily Wynetta Fines T, MD   200 mg at 03/17/20 0859  . multivitamin with minerals tablet 1 tablet  1 tablet Oral Daily Lequita Halt, MD   1 tablet at 03/17/20 0859  . mupirocin ointment (BACTROBAN) 2 % 1 application  1 application Topical Daily Wynetta Fines T, MD      . omega-3 acid ethyl esters (LOVAZA) capsule 1 g  1 g Oral Daily Wynetta Fines T, MD   1 g at 03/17/20 0858  . prochlorperazine (COMPAZINE) tablet 10 mg  10 mg Oral Q6H PRN Wynetta Fines T, MD      . rivaroxaban Alveda Reasons) tablet 20 mg  20 mg Oral Q supper Wynetta Fines T, MD   20 mg at 03/16/20 1759  . sodium chloride flush (NS) 0.9 % injection 3 mL  3 mL Intravenous Once Sherwood Gambler, MD      . thiamine tablet 100 mg  100 mg Oral Daily Wynetta Fines T, MD   100 mg at 03/17/20 0900  . vitamin A & D ointment 1 application  1 application Topical PRN Lequita Halt, MD         Discharge Medications: Please see discharge summary for a list of discharge medications.  Relevant Imaging Results:  Relevant Lab Results:   Additional Information [phone]  Trula Ore, LCSWA

## 2020-03-17 NOTE — TOC Initial Note (Signed)
Transition of Care [facility]) - Initial/Assessment Note    Patient Details  Name: [name] MRN: [id number] Date of Birth: 11-08-1940  Transition of Care [facility]) CM/SW Contact:    Trula Ore, [place] Phone Number: 03/17/2020, 11:53 AM  Clinical Narrative:                  CSW spoke with patient at bedside. Patient was agreeable to CSW calling his son Jenny Reichmann to discuss his care. CSW called Patients son and patients son Jenny Reichmann is agreeable to SNF placement for patient. Patients son gave CSW permission to fax out initial referral to the [place] area. CSW will provide bed offers to Son as soon as they come in.  CSW will continue to follow.  Expected Discharge Plan: Skilled Nursing Facility Barriers to Discharge: Continued Medical Work up   Patient Goals and CMS Choice Patient states their goals for this hospitalization and ongoing recovery are:: to go to SNF CMS Medicare.gov Compare Post Acute Care list provided to:: Patient Represenative (must comment) (Son Jenny Reichmann) Choice offered to / list presented to : Adult Children (Son Jenny Reichmann)  Expected Discharge Plan and Services Expected Discharge Plan: [place] arrangements for the past 2 months: Single Family Home                                      Prior Living Arrangements/Services Living arrangements for the past 2 months: Single Family Home Lives with:: Self, Significant Other (Lives with Ex Wife) Patient language and need for interpreter reviewed:: Yes Do you feel safe going back to the place where you live?: No   SNF  Need for Family Participation in Patient Care: Yes (Comment) Care giver support system in place?: Yes (comment)   Criminal Activity/Legal Involvement Pertinent to Current Situation/Hospitalization: No - Comment as needed  Activities of Daily Living      Permission Sought/Granted Permission sought to share information with : Case Manager, Family Supports, Forensic psychologist Permission granted to share information with : Yes, Verbal Permission Granted  Share Information with NAME: Jenny Reichmann  Permission granted to share info w AGENCY: SNF  Permission granted to share info w Relationship: Son  Permission granted to share info w Contact Information: Jenny Reichmann [phone]  Emotional Assessment Appearance:: Appears stated age Attitude/Demeanor/Rapport: Gracious Affect (typically observed): Calm Orientation: : Oriented to Self, Oriented to Place, Oriented to  Time, Oriented to Situation Alcohol / Substance Use: Not Applicable Psych Involvement: No (comment)  Admission diagnosis:  Septic shock ([place]) [A41.9, R65.21] Sepsis ([place]) [A41.9] Patient Active Problem List   Diagnosis Date Noted  . Sepsis ([place]) 03/15/2020  . 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 Encounter for antineoplastic chemotherapy 01/03/2020  . Goals of care, counseling/discussion 01/03/2020  . A-fib ([place]) 09/11/2019  . Visit for monitoring Tikosyn therapy 06/05/2018  . Persistent atrial fibrillation ([place])   . Pneumothorax on right 05/19/2017  . Alcohol intoxication in active alcoholic ([place]) 37/62/8315  . Right-sided muscle weakness 04/28/2013  . 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 Coronary artery disease-nonobstructive catheter in October 2011 12/27/2010  . PACEMAKER, St. Jude dual-chamber 09/14/2010  . Multiple myeloma ([place]) 07/21/2010  . SMOKER 07/21/2010  . Obstructive sleep apnea 06/12/2009  . Atrioventricular block, heart high-grade 01/29/2009  . SICK SINUS/ TACHY-BRADY SYNDROME 01/29/2009  . Atrial tachycardia/fibrillation 01/14/2009  . SCIATICA 01/14/2009  .  Essential hypertension 01/03/2009  . VENOUS INSUFFICIENCY 01/03/2009  . ALLERGY 01/03/2009   PCP:  [name], MD Pharmacy:   [facility], [place] - [address] [place] [address] Phone: [phone] Fax:  [facility] [place]), [place] - [address] [place]) [place] [address] Phone: [phone] Fax: [phone]     Social Determinants of Health (SDOH) Interventions    Readmission Risk Interventions No flowsheet data found.

## 2020-03-17 NOTE — Consult Note (Signed)
WOC Nurse Consult Note: Patient receiving care in [place].  Patient is on a SizeWise bed with low air loss mattress. Reason for Consult: "sacral wound/other wounds" Wound type: NO sacral wound found beneath the existing sacral foam dressing.  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    There are scattered skin tears on the left arm as well.  Pressure Injury POA: Yes/No/NA Measurement: Wound bed: Drainage (amount, consistency, odor)  Periwound: Dressing procedure/placement/frequency:  Saturate the dressing to the left arm with saline, gently remove the existing dressing.  Place Mepitel Kellie Simmering [phone]) over skin tears, cover with ABD pads, secure with kerlex. Change every other day. For the buttocks, QID application of Gerhardt's butt cream.  I placed a Primofit on the patient while in the room, as the condom cath had popped off, and connected it to suction. The patient was cleansed of fecal incontinence, and I have sent the nurse a SecureChat message asking her to see if the provider will order a Flexiseal Fecal Containment system.  Monitor the wound area(s) for worsening of condition such as: Signs/symptoms of infection,  Increase in size,  Development of or worsening of odor, Development of pain, or increased pain at the affected locations.  Notify the medical team if any of these develop.  Thank you for the consult.  Discussed plan of care with the patient.  [place] nurse will not follow at this time.  Please re-consult the [place] team if needed.  Val Riles, RN, MSN, CWOCN, CNS-BC, pager [phone]

## 2020-03-17 NOTE — Plan of Care (Addendum)
  Problem: Education: Goal: Knowledge of General Education information will improve Description: Including pain rating scale, medication(s)/side effects and non-pharmacologic comfort measures Outcome: Completed/Met   Problem: Health Behavior/Discharge Planning: Goal: Ability to manage health-related needs will improve Outcome: Completed/Met   Problem: Clinical Measurements: Goal: Diagnostic test results will improve Outcome: Completed/Met Goal: Respiratory complications will improve Outcome: Completed/Met   Problem: Activity: Goal: Risk for activity intolerance will decrease Outcome: Progressing   Problem: Nutrition: Goal: Adequate nutrition will be maintained Outcome: Progressing   Problem: Elimination: Goal: Will not experience complications related to bowel motility Outcome: Progressing Note: Pt's stool is watery. Md to order imodium now that c-diff panel is negative   Problem: Skin Integrity: Goal: Risk for impaired skin integrity will decrease Outcome: Progressing  Wound care consult done & air mattress added alone with medication for his MASD Problem: Safety: Goal: Ability to remain free from injury will improve Outcome: Progressing

## 2020-03-17 NOTE — Progress Notes (Signed)
Patient ID: [name], male   DOB: April 01, 1941, 79 y.o.   MRN: [id number]  PROGRESS NOTE    [name]  QJF:[id number] DOB: [birth year] DOA: 03/15/2020 PCP: [name], MD   Brief Narrative:  79 year old male with history of multiple myeloma on chemotherapy, CAD, A. fib, hypertension presented with generalized weakness and increasing leg pain on 03/15/2020.  On presentation, he had a low-grade temperature his blood pressure was on the lower side but there was no tachycardia or hypoxia.  Urinalysis was unremarkable; chest x-ray showed no infiltrate.  He is slightly elevated lactic acid.  He was started on broad-spectrum antibiotics and IV fluids.  Assessment & Plan:   Probable sepsis: Present on admission -Unclear source.  Patient had a low-grade temperature on presentation, was hypotensive and had slightly elevated lactic acid.  UA was unremarkable.  Chest x-ray showed no infiltrates.  No cellulitic changes.  CT of the abdomen and pelvis with contrast showed diverticulosis without diverticulitis. -Afebrile for the last 24 hours.  DC IV fluids and vancomycin.  Continue cefepime for 1 more day.  Cultures negative so far.  Procalcitonin improving but still elevated at 3.74 this morning. -Stool for C. difficile negative.  COVID-19 testing negative.  Multiple myeloma, currently on chemotherapy -Follow-up with oncology as an outpatient.  Communicated with Dr. Julien Nordmann on 03/16/2020 on secure chat and he recommended that patient's chemotherapy will be on hold for now till patient gets clinically better.  Thrombocytopenia -Probably from above.  No signs of bleeding.  Monitor  Hypokalemia -Replace.  Repeat a.m. labs  Generalized weakness -PT recommends SNF placement.  Social worker consult  Hypertension -Blood pressure still on the lower side.  Monitor.  Antihypertensives on hold.  Paroxysmal A. fib -Paced rhythm.  Continue Xarelto  Dyslipidemia -Continue statin  DVT prophylaxis:  Xarelto Code Status: Full Family Communication: Spoke to son/[name] on phone on 03/17/2020.  Rachit stated that he is one of the power of attorneys and would want to be notified about his dad's updates. Disposition Plan: Status is: Inpatient  Remains inpatient appropriate because:Inpatient level of care appropriate due to severity of illness   Dispo: The patient is from: Home              Anticipated d/c is to: SNF              Anticipated d/c date is: 2 days              Patient currently is not medically stable to d/c.   Consultants: None  Procedures: Echo ordered is pending  Antimicrobials: Vancomycin and cefepime from 03/15/2020 onwards   Subjective: Patient seen and examined at bedside.  Poor historian.  Still feels weak.  No overnight fever, vomiting, worsening abdominal pain.  Nursing staff reported 3 episodes of loose watery diarrhea overnight.   Objective: Vitals:   03/17/20 0000 03/17/20 0100 03/17/20 0237 03/17/20 0748  BP:   (!) 107/56 107/67  Pulse:   70 70  Resp: _0 Temp:   97.8 F (36.6 C) 98 F (36.7 C)  TempSrc:   Oral Oral  SpO2:   96% 97%  Weight:   107 kg   Height:        Intake/Output Summary (Last 24 hours) at 03/17/2020 0750 Last data filed at 03/17/2020 0239 Gross per 24 hour  Intake 360 ml  Output 900 ml  Net -540 ml   Filed Weights   03/15/20 1159 03/16/20 0424 03/17/20 8937  Weight: 101.2 kg 114.1 kg 107 kg    Examination:  General exam: No acute distress.  Elderly male lying in bed.  Poor historian. Respiratory system: Bilateral decreased breath sounds at bases with some scattered crackles  cardiovascular system: Rate controlled, S1-S2 heard Gastrointestinal system: Abdomen is nondistended, soft and nontender.  Bowel sounds heard  extremities: No cyanosis, clubbing; trace bilateral lower extremity edema present; chronic lymphedema-like changes present Central nervous system: Poor historian, very hard of hearing, speech is also not  that clear.  Wakes up slightly, does not answer most questions.  No focal neurological deficits. Moving extremities Skin: No obvious lesions/ecchymosis Psychiatry: Cannot assess because of mental status/patient being a poor historian.     Data Reviewed: I have personally reviewed following labs and imaging studies  CBC: Recent Labs  Lab 03/15/20 1113 03/16/20 0259 03/17/20 0349  WBC 9.9 9.6 9.2  NEUTROABS  --   --  5.4  HGB 11.2* 10.9* 10.3*  HCT 33.2* 30.8* 30.7*  MCV 98.5 96.9 97.8  PLT 93* 73* 82*   Basic Metabolic Panel: Recent Labs  Lab 03/15/20 1113 03/16/20 0259 03/17/20 0349  NA 136 135 137  K 3.5 3.4* 3.3*  CL 109 111 112*  CO2 18* 16* 16*  GLUCOSE 100* 74 79  BUN _0 CREATININE 0.98 0.88 0.98  CALCIUM 8.4* 8.0* 8.0*  MG  --   --  1.7   GFR: Estimated Creatinine Clearance: 73.6 mL/min (by C-G formula based on SCr of 0.98 mg/dL). Liver Function Tests: Recent Labs  Lab 03/15/20 1138 03/17/20 0349  AST 20 67*  ALT 20 27  ALKPHOS 37* 34*  BILITOT 1.5* 1.7*  PROT 5.6* 4.9*  ALBUMIN 3.2* 2.4*   No results for input(s): LIPASE, AMYLASE in the last 168 hours. No results for input(s): AMMONIA in the last 168 hours. Coagulation Profile: Recent Labs  Lab 03/15/20 1334  INR 1.9*   Cardiac Enzymes: No results for input(s): CKTOTAL, CKMB, CKMBINDEX, TROPONINI in the last 168 hours. BNP (last 3 results) No results for input(s): PROBNP in the last 8760 hours. HbA1C: No results for input(s): HGBA1C in the last 72 hours. CBG: Recent Labs  Lab 03/15/20 1206  GLUCAP 91   Lipid Profile: No results for input(s): CHOL, HDL, LDLCALC, TRIG, CHOLHDL, LDLDIRECT in the last 72 hours. Thyroid Function Tests: Recent Labs    03/15/20 1711  TSH 0.929   Anemia Panel: No results for input(s): VITAMINB12, FOLATE, FERRITIN, TIBC, IRON, RETICCTPCT in the last 72 hours. Sepsis Labs: Recent Labs  Lab 03/15/20 1156 03/15/20 1334 03/15/20 1711  03/16/20 0259 03/17/20 0349  PROCALCITON  --   --  2.19 4.28 3.74  LATICACIDVEN 2.8* 2.2*  --   --   --     Recent Results (from the past 240 hour(s))  Culture, blood (routine x 2)     Status: None (Preliminary result)   Collection Time: 03/15/20 12:08 PM   Specimen: BLOOD LEFT ARM  Result Value Ref Range Status   Specimen Description BLOOD LEFT ARM  Final   Special Requests   Final    BOTTLES DRAWN AEROBIC AND ANAEROBIC Blood Culture adequate volume   Culture   Final    NO GROWTH 1 DAY Performed at [facility], [place] [address] [place], [place] [address]    Report Status PENDING  Incomplete  Culture, blood (routine x 2)     Status: None (Preliminary result)   Collection Time: 03/15/20 12:09 PM  Specimen: BLOOD  Result Value Ref Range Status   Specimen Description BLOOD LEFT ANTECUBITAL  Final   Special Requests   Final    BOTTLES DRAWN AEROBIC AND ANAEROBIC Blood Culture adequate volume   Culture   Final    NO GROWTH 1 DAY Performed at [facility], [address], [place], [place] [address]    Report Status PENDING  Incomplete  SARS Coronavirus 2 by RT PCR (hospital order, performed in [facility] hospital lab) Nasopharyngeal Nasopharyngeal Swab     Status: None   Collection Time: 03/15/20  2:39 PM   Specimen: Nasopharyngeal Swab  Result Value Ref Range Status   SARS Coronavirus 2 NEGATIVE NEGATIVE Final    Comment: (NOTE) SARS-CoV-2 target nucleic acids are NOT DETECTED.  The SARS-CoV-2 RNA is generally detectable in upper and lower respiratory specimens during the acute phase of infection. The lowest concentration of SARS-CoV-2 viral copies this assay can detect is 250 copies / mL. A negative result does not preclude SARS-CoV-2 infection and should not be used as the sole basis for treatment or other patient management decisions.  A negative result may occur with improper specimen collection / handling, submission of specimen other than nasopharyngeal  swab, presence of viral mutation(s) within the areas targeted by this assay, and inadequate number of viral copies (<250 copies / mL). A negative result must be combined with clinical observations, patient history, and epidemiological information.  Fact Sheet for Patients:   StrictlyIdeas.no  Fact Sheet for Healthcare Providers: BankingDealers.co.za  This test is not yet approved or  cleared by the [place] FDA and has been authorized for detection and/or diagnosis of SARS-CoV-2 by FDA under an Emergency Use Authorization (EUA).  This EUA will remain in effect (meaning this test can be used) for the duration of the COVID-19 declaration under Section 564(b)(1) of the Act, 21 U.S.C. section 360bbb-3(b)(1), unless the authorization is terminated or revoked sooner.  Performed at [facility], [place] [address] [place], [place] [address]   Urine culture     Status: None   Collection Time: 03/15/20  2:45 PM   Specimen: In/Out Cath Urine  Result Value Ref Range Status   Specimen Description IN/OUT CATH URINE  Final   Special Requests NONE  Final   Culture   Final    NO GROWTH Performed at [facility], [place] [address] [place], [place] [address]    Report Status 03/16/2020 FINAL  Final  C Difficile Quick Screen w PCR reflex     Status: None   Collection Time: 03/16/20  5:43 AM   Specimen: STOOL  Result Value Ref Range Status   C Diff antigen NEGATIVE NEGATIVE Final   C Diff toxin NEGATIVE NEGATIVE Final   C Diff interpretation No C. difficile detected.  Final    Comment: Performed at [facility], [place] [address], [place], [place] [address]         Radiology Studies: DG Chest 2 View  Result Date: 03/15/2020 CLINICAL DATA:  Status post fall EXAM: CHEST - 2 VIEW COMPARISON:  June 06, 2017 FINDINGS: Mediastinal contour and cardiac silhouette are stable. There is no focal infiltrate, pulmonary edema, or  pleural effusion. Bony structures are stable. IMPRESSION: No active cardiopulmonary disease. Electronically Signed   By: Abelardo Diesel M.D.   On: 03/15/2020 13:36   CT Head Wo Contrast  Result Date: 03/15/2020 CLINICAL DATA:  Head trauma with headache EXAM: CT HEAD WITHOUT CONTRAST  TECHNIQUE: Contiguous axial images were obtained from the base of the skull through the vertex without intravenous contrast. COMPARISON:  April 28, 2013 FINDINGS: Brain: No evidence of acute infarction, hemorrhage, hydrocephalus, extra-axial collection or mass lesion/mass effect. Signs of atrophy and mild chronic microvascular ischemic change Vascular: No hyperdense vessel or unexpected calcification. Skull: Normal. Negative for fracture or focal lesion. Sinuses/Orbits: No acute finding. Other: Scalp hematoma in the occipital parietal region in the midline. IMPRESSION: 1. No acute intracranial abnormality. 2. Scalp hematoma in the occipital parietal region in the midline of the subcutaneous, superficial scalp. Electronically Signed   By: Zetta Bills M.D.   On: 03/15/2020 12:55   CT ABDOMEN PELVIS W CONTRAST  Result Date: 03/15/2020 CLINICAL DATA:  Abdominal pain and diarrhea for several days EXAM: CT ABDOMEN AND PELVIS WITH CONTRAST TECHNIQUE: Multidetector CT imaging of the abdomen and pelvis was performed using the standard protocol following bolus administration of intravenous contrast. CONTRAST:  156m OMNIPAQUE IOHEXOL 300 MG/ML  SOLN COMPARISON:  02/08/2018 FINDINGS: Lower chest: Mild bibasilar atelectasis is noted with minimal effusions bilaterally. Hepatobiliary: No focal liver abnormality is seen. No gallstones, gallbladder wall thickening, or biliary dilatation. Pancreas: Unremarkable. No pancreatic ductal dilatation or surrounding inflammatory changes. Spleen: Normal in size without focal abnormality. Adrenals/Urinary Tract: Adrenal glands are within normal limits. Kidneys demonstrate a normal enhancement pattern  bilaterally. No renal calculi are seen. Small left renal cyst is noted. Normal excretion of contrast is noted bilaterally. The bladder is well distended. Stomach/Bowel: Diverticulosis of the colon is noted without evidence of diverticulitis. No obstructive or inflammatory changes are seen. The appendix is barium filled. Small bowel and stomach appear within normal limits. Vascular/Lymphatic: Aortic atherosclerosis. No enlarged abdominal or pelvic lymph nodes. Reproductive: Prostate is unremarkable. Other: No abdominal wall hernia or abnormality. No abdominopelvic ascites. Musculoskeletal: Degenerative changes of the thoracolumbar spine are noted. IMPRESSION: Diverticulosis without diverticulitis. Mild bibasilar atelectasis with small effusions. No other focal abnormality is noted. Electronically Signed   By: MInez CatalinaM.D.   On: 03/15/2020 23:19   DG Knee Complete 4 Views Left  Result Date: 03/15/2020 CLINICAL DATA:  Status post fall with knee pain. EXAM: LEFT KNEE - COMPLETE 4+ VIEW COMPARISON:  None. FINDINGS: No acute fracture or dislocation is noted. Degenerative joint changes with narrowed joint space and osteophyte formation are noted. IMPRESSION: No acute fracture or dislocation. Electronically Signed   By: WAbelardo DieselM.D.   On: 03/15/2020 13:38   DG Knee Complete 4 Views Right  Result Date: 03/15/2020 CLINICAL DATA:  Status post fall with pain EXAM: RIGHT KNEE - COMPLETE 4+ VIEW COMPARISON:  May 07, 2019 FINDINGS: Degenerative joint changes of the right knee with narrowed joint space and osteophyte formation are identified. There is no acute fracture or dislocation. IMPRESSION: Degenerative joint changes of right knee. No acute fracture or dislocation noted. Electronically Signed   By: WAbelardo DieselM.D.   On: 03/15/2020 13:37   ECHOCARDIOGRAM COMPLETE  Result Date: 03/16/2020    ECHOCARDIOGRAM REPORT   Patient Name:   JYAHMIR SOKOLOVDate of Exam: 03/16/2020 Medical Rec #:  [id number]      Height:       70.0 in Accession #:    [id number]    Weight:       251.5 lb Date of Birth:  501/28/1942     BSA:          2.301 m Patient Age:    82years  BP:           96/53 mmHg Patient Gender: M              HR:           70 bpm. Exam Location:  Inpatient Procedure: 2D Echo, Cardiac Doppler, Color Doppler and Intracardiac            Opacification Agent Indications:    Hypotension  History:        Patient has prior history of Echocardiogram examinations, most                 recent 06/07/2018. CAD, Pacemaker, Arrythmias:Atrial                 Fibrillation; Risk Factors:Former Smoker, Sleep Apnea and                 Hypertension.  Sonographer:    Clayton Lefort RDCS (AE) Referring Phys: [id number] Lequita Halt  Sonographer Comments: Limited patient mobility. IMPRESSIONS  1. Left ventricular ejection fraction, by estimation, is 55 to 60%. The left ventricle has normal function. The left ventricle has no regional wall motion abnormalities. There is mild left ventricular hypertrophy. Left ventricular diastolic parameters are indeterminate.  2. Right ventricular systolic function is normal. The right ventricular size is normal. There is normal pulmonary artery systolic pressure. The estimated right ventricular systolic pressure is 91.4 mmHg.  3. The mitral valve is grossly normal, mildly calcified annulus. Trivial mitral valve regurgitation.  4. The aortic valve is tricuspid. Aortic valve regurgitation is not visualized. Mild to moderate aortic valve sclerosis/calcification is present, without any evidence of aortic stenosis. Aortic valve mean gradient measures 3.0 mmHg.  5. The inferior vena cava is dilated in size with >50% respiratory variability, suggesting right atrial pressure of 8 mmHg. FINDINGS  Left Ventricle: Left ventricular ejection fraction, by estimation, is 55 to 60%. The left ventricle has normal function. The left ventricle has no regional wall motion abnormalities. Definity contrast agent was given IV  to delineate the left ventricular  endocardial borders. The left ventricular internal cavity size was normal in size. There is mild left ventricular hypertrophy. Left ventricular diastolic parameters are indeterminate. Right Ventricle: The right ventricular size is normal. No increase in right ventricular wall thickness. Right ventricular systolic function is normal. There is normal pulmonary artery systolic pressure. The tricuspid regurgitant velocity is 2.55 m/s, and  with an assumed right atrial pressure of 8 mmHg, the estimated right ventricular systolic pressure is 78.2 mmHg. Left Atrium: Left atrial size was normal in size. Right Atrium: Right atrial size was normal in size. Pericardium: A small pericardial effusion is present. The pericardial effusion is posterior to the left ventricle. Presence of pericardial fat pad. Mitral Valve: The mitral valve is grossly normal. Mild mitral annular calcification. Trivial mitral valve regurgitation. Tricuspid Valve: The tricuspid valve is grossly normal. Tricuspid valve regurgitation is mild. Aortic Valve: The aortic valve is tricuspid. Aortic valve regurgitation is not visualized. Mild to moderate aortic valve sclerosis/calcification is present, without any evidence of aortic stenosis. Mild aortic valve annular calcification. Aortic valve mean gradient measures 3.0 mmHg. Aortic valve peak gradient measures 5.5 mmHg. Aortic valve area, by VTI measures 2.96 cm. Pulmonic Valve: The pulmonic valve was not well visualized. Pulmonic valve regurgitation is not visualized. Aorta: The aortic root is normal in size and structure. Venous: The inferior vena cava is dilated in size with greater than 50% respiratory variability, suggesting right atrial pressure of  8 mmHg. IAS/Shunts: No atrial level shunt detected by color flow Doppler. Additional Comments: A pacer wire is visualized.  LEFT VENTRICLE PLAX 2D LVIDd:         4.10 cm LVIDs:         2.90 cm LV PW:         1.20 cm LV  IVS:        1.20 cm LVOT diam:     2.20 cm LV SV:         75 LV SV Index:   33 LVOT Area:     3.80 cm  IVC IVC diam: 2.20 cm LEFT ATRIUM           Index       RIGHT ATRIUM           Index LA diam:      4.30 cm 1.87 cm/m  RA Area:     27.80 cm LA Vol (A2C): 62.0 ml 26.95 ml/m RA Volume:   92.90 ml  40.38 ml/m LA Vol (A4C): 52.1 ml 22.64 ml/m  AORTIC VALVE AV Area (Vmax):    2.91 cm AV Area (Vmean):   2.75 cm AV Area (VTI):     2.96 cm AV Vmax:           117.00 cm/s AV Vmean:          82.860 cm/s AV VTI:            0.253 m AV Peak Grad:      5.5 mmHg AV Mean Grad:      3.0 mmHg LVOT Vmax:         89.50 cm/s LVOT Vmean:        59.920 cm/s LVOT VTI:          0.197 m LVOT/AV VTI ratio: 0.78  AORTA Ao Root diam: 3.50 cm Ao Asc diam:  3.70 cm TRICUSPID VALVE TR Peak grad:   26.0 mmHg TR Vmax:        255.00 cm/s  SHUNTS Systemic VTI:  0.20 m Systemic Diam: 2.20 cm Rozann Lesches MD Electronically signed by Rozann Lesches MD Signature Date/Time: 03/16/2020/11:33:34 AM    Final    DG Femur Min 2 Views Left  Result Date: 03/15/2020 CLINICAL DATA:  Status post fall with weakness of bilateral lower extremities. EXAM: LEFT FEMUR 2 VIEWS COMPARISON:  None. FINDINGS: No acute fracture or dislocation is noted. Degenerative joint changes of the left hip and left knee are identified with narrowed joint space and osteophyte formation. IMPRESSION: No acute fracture or dislocation. Electronically Signed   By: Abelardo Diesel M.D.   On: 03/15/2020 13:38        Scheduled Meds: . acyclovir  200 mg Oral BID  . atorvastatin  10 mg Oral q1800  . ciprofloxacin  1 drop Both Eyes Q4H while awake  . finasteride  5 mg Oral Daily  . magnesium oxide  200 mg Oral Daily  . multivitamin with minerals  1 tablet Oral Daily  . mupirocin ointment  1 application Topical Daily  . omega-3 acid ethyl esters  1 g Oral Daily  . rivaroxaban  20 mg Oral Q supper  . sodium chloride flush  3 mL Intravenous Once  . thiamine  100 mg Oral  Daily   Continuous Infusions: . sodium chloride 75 mL/hr at 03/17/20 0314  . ceFEPime (MAXIPIME) IV 2 g (03/17/20 0545)  . vancomycin 750 mg (03/17/20 0155)  Aline August, MD [facility] 03/17/2020, 7:50 AM

## 2020-03-18 ENCOUNTER — Telehealth: Payer: Self-pay | Admitting: Medical Oncology

## 2020-03-18 DIAGNOSIS — Z79899 Other long term (current) drug therapy: Secondary | ICD-10-CM

## 2020-03-18 DIAGNOSIS — K5790 Diverticulosis of intestine, part unspecified, without perforation or abscess without bleeding: Secondary | ICD-10-CM | POA: Insufficient documentation

## 2020-03-18 DIAGNOSIS — E872 Acidosis, unspecified: Secondary | ICD-10-CM | POA: Diagnosis present

## 2020-03-18 DIAGNOSIS — Z7901 Long term (current) use of anticoagulants: Secondary | ICD-10-CM

## 2020-03-18 DIAGNOSIS — I248 Other forms of acute ischemic heart disease: Secondary | ICD-10-CM | POA: Diagnosis present

## 2020-03-18 DIAGNOSIS — E669 Obesity, unspecified: Secondary | ICD-10-CM | POA: Diagnosis present

## 2020-03-18 DIAGNOSIS — E876 Hypokalemia: Secondary | ICD-10-CM

## 2020-03-18 DIAGNOSIS — M1990 Unspecified osteoarthritis, unspecified site: Secondary | ICD-10-CM | POA: Insufficient documentation

## 2020-03-18 DIAGNOSIS — T451X5A Adverse effect of antineoplastic and immunosuppressive drugs, initial encounter: Secondary | ICD-10-CM

## 2020-03-18 DIAGNOSIS — D638 Anemia in other chronic diseases classified elsewhere: Secondary | ICD-10-CM | POA: Diagnosis present

## 2020-03-18 DIAGNOSIS — S0003XA Contusion of scalp, initial encounter: Secondary | ICD-10-CM | POA: Diagnosis present

## 2020-03-18 DIAGNOSIS — L8990 Pressure ulcer of unspecified site, unspecified stage: Secondary | ICD-10-CM | POA: Insufficient documentation

## 2020-03-18 DIAGNOSIS — R531 Weakness: Secondary | ICD-10-CM

## 2020-03-18 DIAGNOSIS — I482 Chronic atrial fibrillation, unspecified: Secondary | ICD-10-CM

## 2020-03-18 DIAGNOSIS — D696 Thrombocytopenia, unspecified: Secondary | ICD-10-CM | POA: Diagnosis present

## 2020-03-18 DIAGNOSIS — Z7969 Long term (current) use of other immunomodulators and immunosuppressants: Secondary | ICD-10-CM

## 2020-03-18 DIAGNOSIS — E785 Hyperlipidemia, unspecified: Secondary | ICD-10-CM

## 2020-03-18 DIAGNOSIS — R7989 Other specified abnormal findings of blood chemistry: Secondary | ICD-10-CM | POA: Diagnosis present

## 2020-03-18 DIAGNOSIS — D6869 Other thrombophilia: Secondary | ICD-10-CM | POA: Diagnosis present

## 2020-03-18 DIAGNOSIS — R296 Repeated falls: Secondary | ICD-10-CM

## 2020-03-18 DIAGNOSIS — E8809 Other disorders of plasma-protein metabolism, not elsewhere classified: Secondary | ICD-10-CM | POA: Diagnosis present

## 2020-03-18 LAB — CBC WITH DIFFERENTIAL/PLATELET
Abs Immature Granulocytes: 0.1 10*3/uL — ABNORMAL HIGH (ref 0.00–0.07)
Basophils Absolute: 0 10*3/uL (ref 0.0–0.1)
Basophils Relative: 0 %
Eosinophils Absolute: 0.7 10*3/uL — ABNORMAL HIGH (ref 0.0–0.5)
Eosinophils Relative: 7 %
HCT: 30 % — ABNORMAL LOW (ref 39.0–52.0)
Hemoglobin: 10.3 g/dL — ABNORMAL LOW (ref 13.0–17.0)
Immature Granulocytes: 1 %
Lymphocytes Relative: 15 %
Lymphs Abs: 1.4 10*3/uL (ref 0.7–4.0)
MCH: 33.1 pg (ref 26.0–34.0)
MCHC: 34.3 g/dL (ref 30.0–36.0)
MCV: 96.5 fL (ref 80.0–100.0)
Monocytes Absolute: 2.1 10*3/uL — ABNORMAL HIGH (ref 0.1–1.0)
Monocytes Relative: 23 %
Neutro Abs: 4.9 10*3/uL (ref 1.7–7.7)
Neutrophils Relative %: 54 %
Platelets: 136 10*3/uL — ABNORMAL LOW (ref 150–400)
RBC: 3.11 MIL/uL — ABNORMAL LOW (ref 4.22–5.81)
RDW: 13.4 % (ref 11.5–15.5)
WBC: 9.2 10*3/uL (ref 4.0–10.5)
nRBC: 0 % (ref 0.0–0.2)

## 2020-03-18 LAB — BASIC METABOLIC PANEL
Anion gap: 11 (ref 5–15)
BUN: 20 mg/dL (ref 8–23)
CO2: 14 mmol/L — ABNORMAL LOW (ref 22–32)
Calcium: 8.3 mg/dL — ABNORMAL LOW (ref 8.9–10.3)
Chloride: 114 mmol/L — ABNORMAL HIGH (ref 98–111)
Creatinine, Ser: 0.85 mg/dL (ref 0.61–1.24)
GFR calc Af Amer: >60 mL/min (ref >60)
GFR calc non Af Amer: >60 mL/min (ref >60)
Glucose, Bld: 72 mg/dL (ref 70–99)
Potassium: 3.2 mmol/L — ABNORMAL LOW (ref 3.5–5.1)
Sodium: 139 mmol/L (ref 135–145)

## 2020-03-18 LAB — PROCALCITONIN: Procalcitonin: 1.98 ng/mL

## 2020-03-18 LAB — AMMONIA: Ammonia: 16 umol/L (ref 9–35)

## 2020-03-18 LAB — MAGNESIUM: Magnesium: 1.7 mg/dL (ref 1.7–2.4)

## 2020-03-18 MED ORDER — SODIUM BICARBONATE 650 MG PO TABS
650.0000 mg | ORAL_TABLET | Freq: Three times a day (TID) | ORAL | Status: DC
  Administered 2020-03-18 – 2020-03-19 (×4): 650 mg via ORAL
  Filled 2020-03-18 (×4): qty 1

## 2020-03-18 MED ORDER — POTASSIUM CHLORIDE CRYS ER 20 MEQ PO TBCR
40.0000 meq | EXTENDED_RELEASE_TABLET | Freq: Two times a day (BID) | ORAL | Status: DC
  Administered 2020-03-18 – 2020-03-22 (×9): 40 meq via ORAL
  Filled 2020-03-18 (×9): qty 2

## 2020-03-18 NOTE — Telephone Encounter (Signed)
Discharge plan confusion-    The pt thinks Julien Nordmann wants him to go home and he wants to do what [facility] advises. I told son that Julien Nordmann is in agreement with going to rehab temporarily .  I will ask Julien Nordmann to communicate to pt. that he needs rehab temporarily.

## 2020-03-18 NOTE — TOC Progression Note (Signed)
Transition of Care [facility]) - Progression Note    Patient Details  Name: [name] MRN: [id number] Date of Birth: 1941-03-31  Transition [facility] Care [facility]) CM/SW [place], [place] Phone Number: 03/18/2020, 2:28 PM  Clinical Narrative:     CSW spoke with patients son Latoya who was agreeable to SNF placement at [place] [facility]. CSW called [facility] to verify approval. [facility] confirmed that they can accept patient for SNF placement. [name] with [facility] will start insurance authorization for patient.  Patient has SNF bed at [place]. Insurance authorization is pending.  Expected Discharge Plan: [place] Barriers to Discharge: Continued Medical Work up  Expected Discharge Plan and Services Expected Discharge Plan: [place] arrangements for the past 2 months: Single Family Home                                       Social Determinants of Health (SDOH) Interventions    Readmission Risk Interventions No flowsheet data found.

## 2020-03-18 NOTE — Care Management Important Message (Signed)
Important Message  Patient Details  Name: [name] MRN: [id number] Date of Birth: 1941-05-31   Medicare Important Message Given:  Yes     [name] 03/18/2020, 8:42 AM

## 2020-03-18 NOTE — Progress Notes (Signed)
Pharmacist Chemotherapy Monitoring - Follow Up Assessment    I verify that I have reviewed each item in the below checklist:  . Regimen for the patient is scheduled for the appropriate day and plan matches scheduled date. Marland Kitchen Appropriate non-routine labs are ordered dependent on drug ordered. . If applicable, additional medications reviewed and ordered per protocol based on lifetime cumulative doses and/or treatment regimen.   Plan for follow-up and/or issues identified: Yes . I-vent associated with next due treatment: Yes . MD and/or nursing notified: No  [name] 03/18/2020 12:05 PM

## 2020-03-18 NOTE — Progress Notes (Signed)
Mar[facility]  PROGRESS NOTE    [name]  OHY:[id number] DOB: 05-21-1941 DOA: 03/15/2020 PCP: [name], MD   Brief Narrative:   79 year old male with history of multiple myeloma on chemotherapy, CAD, A. fib, hypertension presented with generalized weakness and increasing leg pain on 03/15/2020.  On presentation, he had a low-grade temperature his blood pressure was on the lower side but there was no tachycardia or hypoxia.  Urinalysis was unremarkable; chest x-ray showed no infiltrate.  He is slightly elevated lactic acid.  He was started on broad-spectrum antibiotics and IV fluids  03/18/20: Still w/o a source for his sepsis. Continuing cefepime for now. BP is still soft. Add K+ and Bicarb. Follow.   Assessment & Plan:   Active Problems:   Sepsis ([place])   Pressure injury of skin  Sepsis: Present on admission     - Unclear source.     - tachypnea, hypotension, elevated lactic acid at admission     - UA was unremarkable. Chest x-ray showed no infiltrates.  No cellulitic changes. CT of the abdomen and pelvis with contrast showed diverticulosis without diverticulitis. Bld Cx NTD. Procal elevated, but now down to 1.98     - Stool for C. difficile negative.  COVID-19 testing negative.     - was started on IV fluids, vancomycin and cefepime. Cultures negative so far.     - remains on cefepime (vanc d/c'd); he is afebrile and WBC is stable; still no source ID'd. Follow  Multiple myeloma, currently on chemotherapy     - follows with Dr. 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  The patient has been tolerating this treatment well with no concerning adverse effects and he is status post 3 cycles."     - per onco: "strongly recommend for the patient to discontinue his systemic chemotherapy for the multiple myeloma at this point.  I will arrange  for him a follow-up visit with me in few weeks after his discharge for evaluation and repeat myeloma panel."  Thrombocytopenia     - Probably from above.     - plts improved to 136 this AM, no evidence of bleed, monitor  Hypokalemia     - start K+ 62mq BID; Mg2+ is ok  Generalized weakness     - PT recommends SNF placement.  Social worker consult  Hypertension     - BP still soft, hold home BP meds for now  Paroxysmal A. fib     - Paced rhythm.  Continue Xarelto     - echo: Left Ventricle: Left ventricular ejection fraction, by estimation, is 55 to 60%. The left ventricle has normal function. The left ventricle has no regional wall motion abnormalities. Definity contrast agent was given IV to delineate the left ventricular  endocardial borders. The left ventricular internal cavity size was normal in size. There is mild left ventricular hypertrophy. Left ventricular diastolic parameters are indeterminate.   Dyslipidemia     - Continue statin  Metabolic acidosis     - follow up lactic acid     - add bicarb  DVT prophylaxis: xarelto Code Status: FULL Family Communication: None at bedside   Status is: Inpatient  Remains inpatient appropriate because:Inpatient level of care appropriate due to severity of illness   Dispo: The patient is from: Home  Anticipated d/c is to: SNF              Anticipated d/c date is: 2 days              Patient currently is not medically stable to d/c.  Consultants:   Oncology  Procedures:   None  Antimicrobials:  . Cefepime   ROS:  Denies CP, N, V, ab pina . Remainder 10-pt ROS is negative for all not previously mentioned.  Subjective: "I hope you can get me out of here."  Objective: Vitals:   03/18/20 0001 03/18/20 0338 03/18/20 0339 03/18/20 0737  BP: (!) 110/53  (!) 103/53 110/68  Pulse:   70 70  Resp:   (!) 22 (!) 24  Temp: 98 F (36.7 C)  98.5 F (36.9 C) 98.7 F (37.1 C)  TempSrc: Oral  Oral Axillary   SpO2:   94% 94%  Weight:  99.5 kg    Height:        Intake/Output Summary (Last 24 hours) at 03/18/2020 1514 Last data filed at 03/18/2020 0800 Gross per 24 hour  Intake 360 ml  Output 900 ml  Net -540 ml   Filed Weights   03/16/20 0424 03/17/20 0237 03/18/20 0338  Weight: 114.1 kg 107 kg 99.5 kg    Examination:  General: 79 y.o. ill appearing male resting in bed in NAD Cardiovascular: RRR, +S1, S2, no m/g/r, equal pulses throughout Respiratory: decreased at bases, normal WOB on RA GI: BS+, NDNT, no masses noted, no organomegaly noted MSK: No e/c/c Neuro: alert to name, follows commands, hard of hearing  Data Reviewed: I have personally reviewed following labs and imaging studies.  CBC: Recent Labs  Lab 03/15/20 1113 03/16/20 0259 03/17/20 0349 03/18/20 0700  WBC 9.9 9.6 9.2 9.2  NEUTROABS  --   --  5.4 4.9  HGB 11.2* 10.9* 10.3* 10.3*  HCT 33.2* 30.8* 30.7* 30.0*  MCV 98.5 96.9 97.8 96.5  PLT 93* 73* 82* 938*   Basic Metabolic Panel: Recent Labs  Lab 03/15/20 1113 03/16/20 0259 03/17/20 0349 03/18/20 0700  NA 136 135 137 139  K 3.5 3.4* 3.3* 3.2*  CL 109 111 112* 114*  CO2 18* 16* 16* 14*  GLUCOSE 100* 74 79 72  BUN 22 21 18 20   CREATININE 0.98 0.88 0.98 0.85  CALCIUM 8.4* 8.0* 8.0* 8.3*  MG  --   --  1.7 1.7   GFR: Estimated Creatinine Clearance: 82 mL/min (by C-G formula based on SCr of 0.85 mg/dL). Liver Function Tests: Recent Labs  Lab 03/15/20 1138 03/17/20 0349  AST 20 67*  ALT 20 27  ALKPHOS 37* 34*  BILITOT 1.5* 1.7*  PROT 5.6* 4.9*  ALBUMIN 3.2* 2.4*   No results for input(s): LIPASE, AMYLASE in the last 168 hours. No results for input(s): AMMONIA in the last 168 hours. Coagulation Profile: Recent Labs  Lab 03/15/20 1334  INR 1.9*   Cardiac Enzymes: No results for input(s): CKTOTAL, CKMB, CKMBINDEX, TROPONINI in the last 168 hours. BNP (last 3 results) No results for input(s): PROBNP in the last 8760 hours. HbA1C: No  results for input(s): HGBA1C in the last 72 hours. CBG: Recent Labs  Lab 03/15/20 1206  GLUCAP 91   Lipid Profile: No results for input(s): CHOL, HDL, LDLCALC, TRIG, CHOLHDL, LDLDIRECT in the last 72 hours. Thyroid Function Tests: Recent Labs    03/15/20 1711  TSH 0.929   Anemia Panel: No results for input(s): VITAMINB12, FOLATE, FERRITIN, TIBC, IRON,  RETICCTPCT in the last 72 hours. Sepsis Labs: Recent Labs  Lab 03/15/20 1156 03/15/20 1334 03/15/20 1711 03/16/20 0259 03/17/20 0349 03/18/20 0700  PROCALCITON  --   --  2.19 4.28 3.74 1.98  LATICACIDVEN 2.8* 2.2*  --   --   --   --     Recent Results (from the past 240 hour(s))  Culture, blood (routine x 2)     Status: None (Preliminary result)   Collection Time: 03/15/20 12:08 PM   Specimen: BLOOD LEFT ARM  Result Value Ref Range Status   Specimen Description BLOOD LEFT ARM  Final   Special Requests   Final    BOTTLES DRAWN AEROBIC AND ANAEROBIC Blood Culture adequate volume   Culture   Final    NO GROWTH 3 DAYS Performed at [facility], [place] [address], [place], [place] [address]    Report Status PENDING  Incomplete  Culture, blood (routine x 2)     Status: None (Preliminary result)   Collection Time: 03/15/20 12:09 PM   Specimen: BLOOD  Result Value Ref Range Status   Specimen Description BLOOD LEFT ANTECUBITAL  Final   Special Requests   Final    BOTTLES DRAWN AEROBIC AND ANAEROBIC Blood Culture adequate volume   Culture   Final    NO GROWTH 3 DAYS Performed at [facility], [place] [address], [place], [place] [address]    Report Status PENDING  Incomplete  SARS Coronavirus 2 by RT PCR (hospital order, performed in [facility] hospital lab) Nasopharyngeal Nasopharyngeal Swab     Status: None   Collection Time: 03/15/20  2:39 PM   Specimen: Nasopharyngeal Swab  Result Value Ref Range Status   SARS Coronavirus 2 NEGATIVE NEGATIVE Final    Comment: (NOTE) SARS-CoV-2 target nucleic acids are NOT  DETECTED.  The SARS-CoV-2 RNA is generally detectable in upper and lower respiratory specimens during the acute phase of infection. The lowest concentration of SARS-CoV-2 viral copies this assay can detect is 250 copies / mL. A negative result does not preclude SARS-CoV-2 infection and should not be used as the sole basis for treatment or other patient management decisions.  A negative result may occur with improper specimen collection / handling, submission of specimen other than nasopharyngeal swab, presence of viral mutation(s) within the areas targeted by this assay, and inadequate number of viral copies (<250 copies / mL). A negative result must be combined with clinical observations, patient history, and epidemiological information.  Fact Sheet for Patients:   StrictlyIdeas.no  Fact Sheet for Healthcare Providers: BankingDealers.co.za  This test is not yet approved or  cleared by the [place] FDA and has been authorized for detection and/or diagnosis of SARS-CoV-2 by FDA under an Emergency Use Authorization (EUA).  This EUA will remain in effect (meaning this test can be used) for the duration of the COVID-19 declaration under Section 564(b)(1) of the Act, 21 U.S.C. section 360bbb-3(b)(1), unless the authorization is terminated or revoked sooner.  Performed at [facility], [place] [address] [place], [place] [address]   Urine culture     Status: None   Collection Time: 03/15/20  2:45 PM   Specimen: In/Out Cath Urine  Result Value Ref Range Status   Specimen Description IN/OUT CATH URINE  Final   Special Requests NONE  Final   Culture   Final    NO GROWTH Performed at [facility], [place] [address] [place], [place] [address]  Report Status 03/16/2020 FINAL  Final  C Difficile Quick Screen w PCR reflex     Status: None   Collection Time: 03/16/20  5:43 AM   Specimen: STOOL  Result Value Ref Range Status    C Diff antigen NEGATIVE NEGATIVE Final   C Diff toxin NEGATIVE NEGATIVE Final   C Diff interpretation No C. difficile detected.  Final    Comment: Performed at [facility], [place] [address], [place], [place] [address]      Radiology Studies: No results found.   Scheduled Meds: . acyclovir  200 mg Oral BID  . atorvastatin  10 mg Oral q1800  . ciprofloxacin  1 drop Both Eyes Q4H while awake  . finasteride  5 mg Oral Daily  . Gerhardt's butt cream   Topical QID  . magnesium oxide  200 mg Oral Daily  . multivitamin with minerals  1 tablet Oral Daily  . mupirocin ointment  1 application Topical Daily  . omega-3 acid ethyl esters  1 g Oral Daily  . rivaroxaban  20 mg Oral Q supper  . sodium chloride flush  3 mL Intravenous Once  . thiamine  100 mg Oral Daily   Continuous Infusions: . ceFEPime (MAXIPIME) IV 2 g (03/18/20 1346)     LOS: 3 days    Time spent: 35 minutes spent in the coordination of care today.    Jonnie Finner, DO [facility]  If 7PM-7AM, please contact night-coverage [email or web address] 03/18/2020, 3:14 PM

## 2020-03-19 ENCOUNTER — Inpatient Hospital Stay (HOSPITAL_COMMUNITY): Payer: Medicare HMO

## 2020-03-19 LAB — BLOOD GAS, ARTERIAL
Acid-base deficit: 8 mmol/L — ABNORMAL HIGH (ref 0.0–2.0)
Bicarbonate: 15.1 mmol/L — ABNORMAL LOW (ref 20.0–28.0)
Drawn by: 51185
FIO2: 21
O2 Saturation: 94.2 %
Patient temperature: 37
pCO2 arterial: 21.3 mmHg — ABNORMAL LOW (ref 32.0–48.0)
pH, Arterial: 7.465 — ABNORMAL HIGH (ref 7.350–7.450)
pO2, Arterial: 68.6 mmHg — ABNORMAL LOW (ref 83.0–108.0)

## 2020-03-19 LAB — COMPREHENSIVE METABOLIC PANEL
ALT: 33 U/L (ref 0–44)
AST: 54 U/L — ABNORMAL HIGH (ref 15–41)
Albumin: 2.3 g/dL — ABNORMAL LOW (ref 3.5–5.0)
Alkaline Phosphatase: 34 U/L — ABNORMAL LOW (ref 38–126)
Anion gap: 10 (ref 5–15)
BUN: 19 mg/dL (ref 8–23)
CO2: 14 mmol/L — ABNORMAL LOW (ref 22–32)
Calcium: 8.3 mg/dL — ABNORMAL LOW (ref 8.9–10.3)
Chloride: 114 mmol/L — ABNORMAL HIGH (ref 98–111)
Creatinine, Ser: 0.79 mg/dL (ref 0.61–1.24)
GFR calc Af Amer: >60 mL/min (ref >60)
GFR calc non Af Amer: >60 mL/min (ref >60)
Glucose, Bld: 98 mg/dL (ref 70–99)
Potassium: 3.8 mmol/L (ref 3.5–5.1)
Sodium: 138 mmol/L (ref 135–145)
Total Bilirubin: 1.5 mg/dL — ABNORMAL HIGH (ref 0.3–1.2)
Total Protein: 5 g/dL — ABNORMAL LOW (ref 6.5–8.1)

## 2020-03-19 LAB — RESPIRATORY PANEL BY PCR

## 2020-03-19 LAB — CBC WITH DIFFERENTIAL/PLATELET
Abs Immature Granulocytes: 0.09 10*3/uL — ABNORMAL HIGH (ref 0.00–0.07)
Basophils Absolute: 0.1 10*3/uL (ref 0.0–0.1)
Basophils Relative: 1 %
Eosinophils Absolute: 0.6 10*3/uL — ABNORMAL HIGH (ref 0.0–0.5)
Eosinophils Relative: 6 %
HCT: 31.1 % — ABNORMAL LOW (ref 39.0–52.0)
Hemoglobin: 10.8 g/dL — ABNORMAL LOW (ref 13.0–17.0)
Immature Granulocytes: 1 %
Lymphocytes Relative: 15 %
Lymphs Abs: 1.4 10*3/uL (ref 0.7–4.0)
MCH: 33.3 pg (ref 26.0–34.0)
MCHC: 34.7 g/dL (ref 30.0–36.0)
MCV: 96 fL (ref 80.0–100.0)
Monocytes Absolute: 2.2 10*3/uL — ABNORMAL HIGH (ref 0.1–1.0)
Monocytes Relative: 23 %
Neutro Abs: 5.2 10*3/uL (ref 1.7–7.7)
Neutrophils Relative %: 54 %
Platelets: 164 10*3/uL (ref 150–400)
RBC: 3.24 MIL/uL — ABNORMAL LOW (ref 4.22–5.81)
RDW: 13.3 % (ref 11.5–15.5)
WBC: 9.5 10*3/uL (ref 4.0–10.5)
nRBC: 0 % (ref 0.0–0.2)

## 2020-03-19 LAB — MRSA PCR SCREENING: MRSA by PCR: NEGATIVE

## 2020-03-19 LAB — FOLATE: Folate: 20.9 ng/mL (ref >5.9)

## 2020-03-19 LAB — LACTIC ACID, PLASMA
Lactic Acid, Venous: 1.8 mmol/L (ref 0.5–1.9)
Lactic Acid, Venous: 1.6 mmol/L (ref 0.5–1.9)

## 2020-03-19 LAB — MAGNESIUM: Magnesium: 1.6 mg/dL — ABNORMAL LOW (ref 1.7–2.4)

## 2020-03-19 LAB — VITAMIN B12: Vitamin B-12: 531 pg/mL (ref 180–914)

## 2020-03-19 MED ORDER — MELATONIN 3 MG PO TABS
3.0000 mg | ORAL_TABLET | Freq: Every day | ORAL | Status: DC
  Administered 2020-03-19 – 2020-03-26 (×7): 3 mg via ORAL
  Filled 2020-03-19 (×7): qty 1

## 2020-03-19 MED ORDER — AZITHROMYCIN 250 MG PO TABS
500.0000 mg | ORAL_TABLET | Freq: Every day | ORAL | Status: AC
  Administered 2020-03-19 – 2020-03-21 (×3): 500 mg via ORAL
  Filled 2020-03-19 (×3): qty 2

## 2020-03-19 MED ORDER — PIPERACILLIN-TAZOBACTAM 3.375 G IVPB
3.3750 g | Freq: Three times a day (TID) | INTRAVENOUS | Status: DC
  Administered 2020-03-19 – 2020-03-24 (×14): 3.375 g via INTRAVENOUS
  Filled 2020-03-19 (×15): qty 50

## 2020-03-19 MED ORDER — LEVALBUTEROL HCL 0.63 MG/3ML IN NEBU
0.6300 mg | INHALATION_SOLUTION | Freq: Four times a day (QID) | RESPIRATORY_TRACT | Status: DC | PRN
  Administered 2020-03-19: 0.63 mg via RESPIRATORY_TRACT
  Filled 2020-03-19: qty 3

## 2020-03-19 MED ORDER — VANCOMYCIN HCL 1500 MG/300ML IV SOLN
1500.0000 mg | Freq: Once | INTRAVENOUS | Status: AC
  Administered 2020-03-19: 1500 mg via INTRAVENOUS
  Filled 2020-03-19: qty 300

## 2020-03-19 MED ORDER — IOHEXOL 350 MG/ML SOLN
80.0000 mL | Freq: Once | INTRAVENOUS | Status: AC | PRN
  Administered 2020-03-19: 80 mL via INTRAVENOUS

## 2020-03-19 MED ORDER — VANCOMYCIN HCL 750 MG/150ML IV SOLN
750.0000 mg | Freq: Two times a day (BID) | INTRAVENOUS | Status: DC
  Administered 2020-03-20 – 2020-03-21 (×3): 750 mg via INTRAVENOUS
  Filled 2020-03-19 (×4): qty 150

## 2020-03-19 MED ORDER — SODIUM BICARBONATE 650 MG PO TABS
1300.0000 mg | ORAL_TABLET | Freq: Three times a day (TID) | ORAL | Status: DC
  Administered 2020-03-19 – 2020-03-22 (×8): 1300 mg via ORAL
  Filled 2020-03-19 (×8): qty 2

## 2020-03-19 NOTE — Progress Notes (Signed)
Marland Kitchen  PROGRESS NOTE    ZAY YEARGAN  NFA:[id number] DOB: [birth year] DOA: 03/15/2020 PCP: [name], MD   Brief Narrative:   79 year old male with history of multiple myeloma on chemotherapy, CAD, A. fib, hypertension presented with generalized weakness and increasing leg pain on 03/15/2020. On presentation, he had a low-grade temperature his blood pressure was on the lower side but there was no tachycardia or hypoxia. Urinalysis was unremarkable; chest x-ray showed no infiltrate. He is slightly elevated lactic acid. He was started on broad-spectrum antibiotics and IV fluids  03/19/20: Hypoxia noted. CXR negative. CTA PE ordered. Cefepime stopped d/t mentation change and no identifiable source of infection. He's afebrile and WBCs are normal. ABG noted. Continue bicarb tablets.    Assessment & Plan:   Active Problems:   Multiple myeloma ([place])   Essential hypertension   Atrial tachycardia/fibrillation   PACEMAKER, [employer] dual-chamber   Coronary artery disease-nonobstructive catheter in October 2011   Sepsis ([place])   Pressure injury of skin   Immunosuppressed due to chemotherapy   Acquired thrombophilia ([place])   Generalized weakness   Frequent falls   Long term (current) use of anticoagulants   Lactic acidosis   Thrombocytopenia ([facility])   Hypoalbuminemia   Hyperbilirubinemia   Demand ischemia (H[facility])   Elevated brain natriuretic peptide (BNP) level   Elevated procalcitonin   Hypokalemia   Anemia of chronic disease   Scalp hematoma   Obesity (BMI 30.0-34.9)  Sepsis: Present on admission     - Unclear source.     - tachypnea, hypotension, elevated lactic acid at admission     - UA was unremarkable. Chest x-ray showed no infiltrates.  No cellulitic changes. CT of the abdomen and pelvis with contrast showed diverticulosis without diverticulitis. Bld Cx NTD. Procal elevated, but now down to 1.98     - Stool for C. difficile negative.  COVID-19 testing negative.     - was started  on IV fluids, vancomycin and cefepime. Cultures negative so far.     - remains on cefepime (vanc d/c'd); he is afebrile and WBC is stable; still no source ID'd. Follow     - 7/8: cefepime d/c'd d/t no source ID'd and mentation changes; CXR/CTA PE ordered as he has become hypoxic. Hold abx for now until source is ID'd.  Multiple myeloma, currently on chemotherapy     - follows with Dr. 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  The patient has been tolerating this treatment well with no concerning adverse effects and he is status post 3 cycles."     - per onco: "strongly recommend for the patient to discontinue his systemic chemotherapy for the multiple myeloma at this point.  I will arrange for him a follow-up visit with me in few weeks after his discharge for evaluation and repeat myeloma panel."  Thrombocytopenia     - Probably from above.     - plts improved to 136 this AM, no evidence of bleed, monitor     - 7/8: plts 164 today; resolved  Hypokalemia Hypomagnesemia     - start K+ 9mq BID; Mg2+ is ok     - 7/8: K+ resolved; continue Mg2+ supplementation  Generalized weakness     - PT recommends SNF placement.  Social worker consult  Hypertension     - BP still soft, hold  home BP meds for now  Paroxysmal A. fib     - Paced rhythm.  Continue Xarelto     - echo: Left Ventricle: Left ventricular ejection fraction, by estimation, is 55 to 60%. The left ventricle has normal function. The left ventricle has no regional wall motion abnormalities. Definity contrast agent was given IV to delineate the left ventricular  endocardial borders. The left ventricular internal cavity size was normal in size. There is mild left ventricular hypertrophy. Left ventricular diastolic parameters are indeterminate.   Dyslipidemia     - Continue  statin  Metabolic acidosis     - follow up lactic acid     - add bicarb     - 7/8: lactic acid is resolved. bicarb is still low; increase tablets to 1300; has a respiratory alkalosis, follow  DVT prophylaxis: xarelto Code Status: FULL Family Communication: Spoke with son by phone   Status is: Inpatient  Remains inpatient appropriate because:Inpatient level of care appropriate due to severity of illness   Dispo: The patient is from: Home              Anticipated d/c is to: SNF              Anticipated d/c date is: > 3 days              Patient currently is not medically stable to d/c.  Consultants:   Oncology  Procedures:   None  Antimicrobials:  . None   ROS:  Reports some dyspnea. Denies CP, N, V, ab pain . Remainder 10-pt ROS is negative for all not previously mentioned.  Subjective: "I want some ice cream."  Objective: Vitals:   03/19/20 0817 03/19/20 1050 03/19/20 1232 03/19/20 1649  BP: 122/87 (!) 116/52  110/63  Pulse: 65 69  69  Resp: (!) 28 (!) 34  (!) 25  Temp:  98.6 F (37 C)  98.2 F (36.8 C)  TempSrc:  Axillary  Oral  SpO2: 93% 94% 95% 95%  Weight:      Height:        Intake/Output Summary (Last 24 hours) at 03/19/2020 1806 Last data filed at 03/19/2020 0800 Gross per 24 hour  Intake 460 ml  Output 225 ml  Net 235 ml   Filed Weights   03/16/20 0424 03/17/20 0237 03/18/20 0338  Weight: 114.1 kg 107 kg 99.5 kg    Examination:  General: 79 y.o. male resting in bed in NAD Cardiovascular: RRR, +S1, S2, no m/g/r, equal pulses throughout Respiratory: UAT w/ soft scattered rhonchi, tachypneic GI: BS+, NDNT, soft MSK: No e/c/c Neuro: alert to name, following commands, not oriented to place/time  Data Reviewed: I have personally reviewed following labs and imaging studies.  CBC: Recent Labs  Lab 03/15/20 1113 03/16/20 0259 03/17/20 0349 03/18/20 0700 03/19/20 0247  WBC 9.9 9.6 9.2 9.2 9.5  NEUTROABS  --   --  5.4 4.9 5.2  HGB 11.2*  10.9* 10.3* 10.3* 10.8*  HCT 33.2* 30.8* 30.7* 30.0* 31.1*  MCV 98.5 96.9 97.8 96.5 96.0  PLT 93* 73* 82* 136* 354   Basic Metabolic Panel: Recent Labs  Lab 03/15/20 1113 03/16/20 0259 03/17/20 0349 03/18/20 0700 03/19/20 0247  NA 136 135 137 139 138  K 3.5 3.4* 3.3* 3.2* 3.8  CL 109 111 112* 114* 114*  CO2 18* 16* 16* 14* 14*  GLUCOSE 100* 74 79 72 98  BUN 22 21 18 20 19   CREATININE 0.98 0.88 0.98 0.85  0.79  CALCIUM 8.4* 8.0* 8.0* 8.3* 8.3*  MG  --   --  1.7 1.7 1.6*   GFR: Estimated Creatinine Clearance: 87.1 mL/min (by C-G formula based on SCr of 0.79 mg/dL). Liver Function Tests: Recent Labs  Lab 03/15/20 1138 03/17/20 0349 03/19/20 0247  AST 20 67* 54*  ALT 20 27 33  ALKPHOS 37* 34* 34*  BILITOT 1.5* 1.7* 1.5*  PROT 5.6* 4.9* 5.0*  ALBUMIN 3.2* 2.4* 2.3*   No results for input(s): LIPASE, AMYLASE in the last 168 hours. Recent Labs  Lab 03/18/20 1648  AMMONIA 16   Coagulation Profile: Recent Labs  Lab 03/15/20 1334  INR 1.9*   Cardiac Enzymes: No results for input(s): CKTOTAL, CKMB, CKMBINDEX, TROPONINI in the last 168 hours. BNP (last 3 results) No results for input(s): PROBNP in the last 8760 hours. HbA1C: No results for input(s): HGBA1C in the last 72 hours. CBG: Recent Labs  Lab 03/15/20 1206  GLUCAP 91   Lipid Profile: No results for input(s): CHOL, HDL, LDLCALC, TRIG, CHOLHDL, LDLDIRECT in the last 72 hours. Thyroid Function Tests: No results for input(s): TSH, T4TOTAL, FREET4, T3FREE, THYROIDAB in the last 72 hours. Anemia Panel: Recent Labs    03/19/20 0247  VITAMINB12 531  FOLATE 20.9   Sepsis Labs: Recent Labs  Lab 03/15/20 1156 03/15/20 1334 03/15/20 1711 03/16/20 0259 03/17/20 0349 03/18/20 0700 03/19/20 1048 03/19/20 1612  PROCALCITON  --   --  2.19 4.28 3.74 1.98  --   --   LATICACIDVEN 2.8* 2.2*  --   --   --   --  1.8 1.6    Recent Results (from the past 240 hour(s))  Culture, blood (routine x 2)     Status:  None (Preliminary result)   Collection Time: 03/15/20 12:08 PM   Specimen: BLOOD LEFT ARM  Result Value Ref Range Status   Specimen Description BLOOD LEFT ARM  Final   Special Requests   Final    BOTTLES DRAWN AEROBIC AND ANAEROBIC Blood Culture adequate volume   Culture   Final    NO GROWTH 4 DAYS Performed at [facility], [place] [address] [place], [place] [address]    Report Status PENDING  Incomplete  Culture, blood (routine x 2)     Status: None (Preliminary result)   Collection Time: 03/15/20 12:09 PM   Specimen: BLOOD  Result Value Ref Range Status   Specimen Description BLOOD LEFT ANTECUBITAL  Final   Special Requests   Final    BOTTLES DRAWN AEROBIC AND ANAEROBIC Blood Culture adequate volume   Culture   Final    NO GROWTH 4 DAYS Performed at [facility], [place] [address] [place], [place] [address]    Report Status PENDING  Incomplete  SARS Coronavirus 2 by RT PCR (hospital order, performed in [facility] hospital lab) Nasopharyngeal Nasopharyngeal Swab     Status: None   Collection Time: 03/15/20  2:39 PM   Specimen: Nasopharyngeal Swab  Result Value Ref Range Status   SARS Coronavirus 2 NEGATIVE NEGATIVE Final    Comment: (NOTE) SARS-CoV-2 target nucleic acids are NOT DETECTED.  The SARS-CoV-2 RNA is generally detectable in upper and lower respiratory specimens during the acute phase of infection. The lowest concentration of SARS-CoV-2 viral copies this assay can detect is 250 copies / mL. A negative result does not preclude SARS-CoV-2 infection and should not be used as the sole basis for treatment or other patient management decisions.  A  negative result may occur with improper specimen collection / handling, submission of specimen other than nasopharyngeal swab, presence of viral mutation(s) within the areas targeted by this assay, and inadequate number of viral copies (<250 copies / mL). A negative result must be combined with clinical observations,  patient history, and epidemiological information.  Fact Sheet for Patients:   StrictlyIdeas.no  Fact Sheet for Healthcare Providers: BankingDealers.co.za  This test is not yet approved or  cleared by the [place] FDA and has been authorized for detection and/or diagnosis of SARS-CoV-2 by FDA under an Emergency Use Authorization (EUA).  This EUA will remain in effect (meaning this test can be used) for the duration of the COVID-19 declaration under Section 564(b)(1) of the Act, 21 U.S.C. section 360bbb-3(b)(1), unless the authorization is terminated or revoked sooner.  Performed at [facility], [place] [address] [place], [place] [address]   Urine culture     Status: None   Collection Time: 03/15/20  2:45 PM   Specimen: In/Out Cath Urine  Result Value Ref Range Status   Specimen Description IN/OUT CATH URINE  Final   Special Requests NONE  Final   Culture   Final    NO GROWTH Performed at [facility], [place] [address], [place], [place] [address]    Report Status 03/16/2020 FINAL  Final  C Difficile Quick Screen w PCR reflex     Status: None   Collection Time: 03/16/20  5:43 AM   Specimen: STOOL  Result Value Ref Range Status   C Diff antigen NEGATIVE NEGATIVE Final   C Diff toxin NEGATIVE NEGATIVE Final   C Diff interpretation No C. difficile detected.  Final    Comment: Performed at [facility], [place] [address], [place], [place] [address]      Radiology Studies: CT HEAD WO CONTRAST  Result Date: 03/19/2020 CLINICAL DATA:  Encephalopathy EXAM: CT HEAD WITHOUT CONTRAST TECHNIQUE: Contiguous axial images were obtained from the base of the skull through the vertex without intravenous contrast. COMPARISON:  03/15/2020 FINDINGS: Brain: There is no mass, hemorrhage or extra-axial collection. The size and configuration of the ventricles and extra-axial CSF spaces are normal. There is hypoattenuation of the white  matter, most commonly indicating chronic small vessel disease. Vascular: Atherosclerotic calcification of the vertebral and internal carotid arteries at the skull base. No abnormal hyperdensity of the major intracranial arteries or dural venous sinuses. Skull: Unchanged vertex scalp hematoma.  No skull fracture. Sinuses/Orbits: No fluid levels or advanced mucosal thickening of the visualized paranasal sinuses. No mastoid or middle ear effusion. The orbits are normal. IMPRESSION: 1. No acute intracranial abnormality. 2. Unchanged vertex scalp hematoma without skull fracture. 3. Chronic small vessel disease. Electronically Signed   By: Ulyses Jarred M.D.   On: 03/19/2020 02:43   CT ANGIO CHEST PE W OR WO CONTRAST  Result Date: 03/19/2020 CLINICAL DATA:  Hypoxemia. EXAM: CT ANGIOGRAPHY CHEST WITH CONTRAST TECHNIQUE: Multidetector CT imaging of the chest was performed using the standard protocol during bolus administration of intravenous contrast. Multiplanar CT image reconstructions and MIPs were obtained to evaluate the vascular anatomy. CONTRAST:  49m OMNIPAQUE IOHEXOL 350 MG/ML SOLN COMPARISON:  Chest radiograph earlier today. FINDINGS: Cardiovascular: Motion artifact limits subsegmental evaluation. There are no filling defects in the pulmonary arteries to the segmental level to suggest pulmonary embolus. Multi chamber cardiomegaly. Pacemaker in place. Contrast refluxes into the hepatic veins and IVC. Coronary artery calcifications. Small pericardial effusion measures up to 9  mm in depth. Mediastinum/Nodes: Shotty mediastinal and hilar nodes largest in the right infrahilar region measuring 10 mm. No visualized thyroid nodule. No esophageal wall thickening. Lungs/Pleura: Small to moderate bilateral pleural effusions with compressive atelectasis. Patchy ground-glass opacities in the anterior and posterior right upper lobe. Mild apical predominant emphysema. Scattered smooth septal thickening suggesting pulmonary  edema. Motion limits detailed parenchymal assessment. The trachea and central bronchi are grossly patent allowing for motion. Upper Abdomen: Contrast refluxing into the hepatic veins and IVC. Colonic diverticulosis. Tiny hiatal hernia. Musculoskeletal: Multilevel degenerative change in the spine. There are no acute or suspicious osseous abnormalities. Review of the MIP images confirms the above findings. IMPRESSION: 1. No pulmonary embolus allowing for motion artifact. 2. Multi chamber cardiomegaly with small pericardial effusion. Contrast refluxing into the hepatic veins and IVC consistent with elevated right heart pressures. 3. Small to moderate bilateral pleural effusions with compressive atelectasis. Mild pulmonary edema. 4. Patchy ground-glass opacities in the anterior and posterior right upper lobe may be pulmonary edema or infectious, including atypical viral organisms. 5. Shotty mediastinal and hilar nodes are likely reactive. Aortic Atherosclerosis (ICD10-I70.0) and Emphysema (ICD10-J43.9). Electronically Signed   By: Keith Rake M.D.   On: 03/19/2020 17:49   DG CHEST PORT 1 VIEW  Result Date: 03/19/2020 CLINICAL DATA:  Wheezing.  Hypertension.  Atrial flutter EXAM: PORTABLE CHEST 1 VIEW COMPARISON:  March 15, 2020 FINDINGS: There is cardiomegaly with pulmonary vascularity normal. Pacemaker leads are attached to the right atrium and right ventricle. There is slight bibasilar atelectasis. No evident edema or airspace opacity. There is aortic atherosclerosis. No adenopathy. No bone lesions. There is calcification in the right carotid artery. IMPRESSION: Stable cardiomegaly. Pacemaker leads attached to right atrium and right ventricle. Areas of mild bibasilar atelectasis. No edema or airspace opacity. Aortic Atherosclerosis (ICD10-I70.0). Foci of right carotid artery calcification noted. Electronically Signed   By: Lowella Grip III M.D.   On: 03/19/2020 13:15     Scheduled Meds: . acyclovir   200 mg Oral BID  . atorvastatin  10 mg Oral q1800  . ciprofloxacin  1 drop Both Eyes Q4H while awake  . finasteride  5 mg Oral Daily  . Gerhardt's butt cream   Topical QID  . magnesium oxide  200 mg Oral Daily  . melatonin  3 mg Oral QHS  . multivitamin with minerals  1 tablet Oral Daily  . mupirocin ointment  1 application Topical Daily  . omega-3 acid ethyl esters  1 g Oral Daily  . potassium chloride  40 mEq Oral BID  . rivaroxaban  20 mg Oral Q supper  . sodium bicarbonate  650 mg Oral TID  . sodium chloride flush  3 mL Intravenous Once  . thiamine  100 mg Oral Daily   Continuous Infusions:   LOS: 4 days    Time spent: 35 minutes spent in the coordination of care today.    Jonnie Finner, DO [facility]  If 7PM-7AM, please contact night-coverage [email or web address] 03/19/2020, 6:06 PM

## 2020-03-19 NOTE — Progress Notes (Addendum)
Patient with increasing respiratory rate. Verbalizes that he feels short of breath. Expiratory wheezing at times. O2 sats 94%, RR 34 at this time. MEWS score is yellow. Full set of vital signs obtained. Patient continues to be disoriented to place and situation.   Text paged Dr. Marylyn Ishihara about change in respiratory status. Orders received for chest xray and ABG.

## 2020-03-19 NOTE — Progress Notes (Signed)
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 male admitted on 03/15/2020 with generalized weakness, concern for infection of unknown source.  Pharmacy consulted 03/19/20 to restart vancomycin and start Zosyn dosing for pneumonia.  Vancomycin 750 mg dose last given on 7/6 @ 0200.   Afebrile, WBC 9.5, LA 1.8>1.6  SCr 0.79, CrCl 87 ml/min   Plan: Vancomycin 1500 mg IV x 1, then 750mg  IV every 12 hours (Vancomycin trough target 15-20) Zosyn 3.375 g IV every 8 hours , 4hour infusion. Monitor renal function, Cx and clinical progression to narrow Vancomycin trough at steady state  Height: 5\' 10"  (177.8 cm) Weight: 99.5 kg (219 lb 5.7 oz) IBW/kg (Calculated) : 73  Temp (24hrs), Avg:98.9 F (37.2 C), Min:98.2 F (36.8 C), Max:99.5 F (37.5 C)  Recent Labs  Lab 03/15/20 1113 03/15/20 1156 03/15/20 1334 03/16/20 0259 03/17/20 0349 03/18/20 0700 03/19/20 0247 03/19/20 1048 03/19/20 1612  WBC 9.9  --   --  9.6 9.2 9.2 9.5  --   --   CREATININE 0.98  --   --  0.88 0.98 0.85 0.79  --   --   LATICACIDVEN  --  2.8* 2.2*  --   --   --   --  1.8 1.6    Estimated Creatinine Clearance: 87.1 mL/min (by C-G formula based on SCr of 0.79 mg/dL).    Allergies  Allergen Reactions  . Bee Venom Anaphylaxis         Antimicrobials this admission: Vanco 7/4>>7/6;  Restart 7/8>> Cefepime 7/4>>7/7 Zosyn 7/8>>   Dose adjustments this admission:   Microbiology results: 7/4 BCx: no growth to date x 4 days 7/4 UCx: neg 7/4 cdiff - neg   Nicole Cella, RPh Clinical Pharmacist Please check AMION for all [facility] phone numbers After 10:00 PM, call [place] 03/19/2020 6:52 PM

## 2020-03-19 NOTE — Progress Notes (Signed)
Physical Therapy Treatment Patient Details Name: [name] MRN: [id number] DOB: [birth year] Today's Date: 03/19/2020    History of Present Illness 79 y.o. male with medical history significant of multiple myeloma on chemotherapy, CAD, A. fib, hypertension presented with generalized weakness and increasing leg pain. Presents to ED after 2 falls in the last 2 days and c/o of chronic diarrhea. Admitted 03/15/20 for treatment of sepsis and weakness.    PT Comments    Pt finishing applesauce from breakfast on entry. Agreeable to getting out of bed with therapy. Pt limited in safe mobility by decreased cognition, command follow, awareness of deficits in presence of generalized weakness and decreased balance. Pt is maxAx2 for coming to the EoB, requires maximal multimodal cuing and when assist for trunk to upright provide pt started shaking. Pt asked if he was okay and he replied that he was scared of falling, inquired of therapist what he should do. Instructed to place feet on floor and sit on the EoB. Pt replies "oh", places feet on floor and stopped shaking. Pt able to sit EoB and progress from maxA for balance to min guard. Performed therapeutic exercise including cross body work. After approximately 15 minutes pt request to use bathroom. Pt returned to supine and placed on bed pan. NT notified. D/c plans remain appropriate. PT will continue to follow acutely.     Follow Up Recommendations  SNF     Equipment Recommendations  Other (comment) (TBD at next venue)    Recommendations for Other Services OT consult     Precautions / Restrictions Precautions Precautions: Fall Precaution Comments: fall prior to hospitalization  Restrictions Weight Bearing Restrictions: No    Mobility  Bed Mobility Overal bed mobility: Needs Assistance Bed Mobility: Supine to Sit;Sit to Supine     Supine to sit: Max assist;+2 for physical assistance Sit to supine: Max assist;+2 for physical assistance    General bed mobility comments: maxAx2 and increased multimodal cuing for supine to sit, pt shaking once at EoB, and reports he is anxious inquires "what do you want me to do?' when told again to sit on the side of the bed he said "oh" and stopped shaking  Transfers                 General transfer comment: unable to perform due to fatigue     Balance Overall balance assessment: Needs assistance Sitting-balance support: Feet supported;Feet unsupported;No upper extremity supported;Bilateral upper extremity supported;Single extremity supported Sitting balance-Leahy Scale: Poor Sitting balance - Comments: able to progress from max A for balance to min guard for balance with bilateral UE raised                                     Cognition Arousal/Alertness: Awake/alert Behavior During Therapy: Flat affect Overall Cognitive Status: Impaired/Different from baseline Area of Impairment: Following commands;Awareness                       Following Commands: Follows one step commands with increased time;Follows multi-step commands with increased time   Awareness: Emergent   General Comments: pt requires increased time and cuing for task completion       Exercises General Exercises - Upper Extremity Elbow Flexion: AROM;Both;5 reps Elbow Extension: AROM;Both;5 reps General Exercises - Lower Extremity Long Arc Quad: AROM;Both;10 reps Hip Flexion/Marching: AROM;Both;10 reps;Seated Other Exercises Other Exercises: cross body reaching 5x  each side    General Comments General comments (skin integrity, edema, etc.): VSS on RA      Pertinent Vitals/Pain Pain Assessment: Faces Faces Pain Scale: Hurts a little bit Pain Location: generalized Pain Descriptors / Indicators: Aching;Sore Pain Intervention(s): Monitored during session           PT Goals (current goals can now be found in the care plan section) Acute Rehab PT Goals Patient Stated Goal: have  less pain PT Goal Formulation: With patient Time For Goal Achievement: 03/30/20 Potential to Achieve Goals: Fair Progress towards PT goals: Progressing toward goals    Frequency    Min 2X/week      PT Plan Current plan remains appropriate       AM-PAC PT "6 Clicks" Mobility   Outcome Measure  Help needed turning from your back to your side while in a flat bed without using bedrails?: Total Help needed moving from lying on your back to sitting on the side of a flat bed without using bedrails?: Total Help needed moving to and from a bed to a chair (including a wheelchair)?: Total Help needed standing up from a chair using your arms (e.g., wheelchair or bedside chair)?: Total Help needed to walk in hospital room?: Total Help needed climbing 3-5 steps with a railing? : Total 6 Click Score: 6    End of Session   Activity Tolerance: Patient limited by fatigue Patient left: in bed;with call bell/phone within reach;Other (comment) (pt on bed pan NT notified) Nurse Communication: Mobility status;Other (comment) (need for air bed ) PT Visit Diagnosis: Muscle weakness (generalized) (M62.81);Pain Pain - Right/Left:  (L>R) Pain - part of body: Hip;Knee     Time: 6063-0160 PT Time Calculation (min) (ACUTE ONLY): 25 min  Charges:  $Therapeutic Exercise: 23-37 mins                      B. Migdalia Dk PT, DPT Acute Rehabilitation Services Pager [phone] Office [phone]    [place] 03/19/2020, 1:57 PM

## 2020-03-19 NOTE — Progress Notes (Signed)
Pt mental status seemed to be more confused than it was with my previous shift with him. I paged MD with this information and a CT/head was ordered.

## 2020-03-20 LAB — CBC WITH DIFFERENTIAL/PLATELET
Abs Immature Granulocytes: 0.14 10*3/uL — ABNORMAL HIGH (ref 0.00–0.07)
Basophils Absolute: 0.1 10*3/uL (ref 0.0–0.1)
Basophils Relative: 1 %
Eosinophils Absolute: 0.9 10*3/uL — ABNORMAL HIGH (ref 0.0–0.5)
Eosinophils Relative: 9 %
HCT: 30.8 % — ABNORMAL LOW (ref 39.0–52.0)
Hemoglobin: 10.6 g/dL — ABNORMAL LOW (ref 13.0–17.0)
Immature Granulocytes: 1 %
Lymphocytes Relative: 13 %
Lymphs Abs: 1.3 10*3/uL (ref 0.7–4.0)
MCH: 33.3 pg (ref 26.0–34.0)
MCHC: 34.4 g/dL (ref 30.0–36.0)
MCV: 96.9 fL (ref 80.0–100.0)
Monocytes Absolute: 2.7 10*3/uL — ABNORMAL HIGH (ref 0.1–1.0)
Monocytes Relative: 27 %
Neutro Abs: 5.1 10*3/uL (ref 1.7–7.7)
Neutrophils Relative %: 49 %
Platelets: 250 10*3/uL (ref 150–400)
RBC: 3.18 MIL/uL — ABNORMAL LOW (ref 4.22–5.81)
RDW: 13.3 % (ref 11.5–15.5)
WBC: 10.3 10*3/uL (ref 4.0–10.5)
nRBC: 0 % (ref 0.0–0.2)

## 2020-03-20 LAB — COMPREHENSIVE METABOLIC PANEL
ALT: 38 U/L (ref 0–44)
AST: 63 U/L — ABNORMAL HIGH (ref 15–41)
Albumin: 2.6 g/dL — ABNORMAL LOW (ref 3.5–5.0)
Alkaline Phosphatase: 36 U/L — ABNORMAL LOW (ref 38–126)
Anion gap: 11 (ref 5–15)
BUN: 18 mg/dL (ref 8–23)
CO2: 17 mmol/L — ABNORMAL LOW (ref 22–32)
Calcium: 8.5 mg/dL — ABNORMAL LOW (ref 8.9–10.3)
Chloride: 112 mmol/L — ABNORMAL HIGH (ref 98–111)
Creatinine, Ser: 0.87 mg/dL (ref 0.61–1.24)
GFR calc Af Amer: >60 mL/min (ref >60)
GFR calc non Af Amer: >60 mL/min (ref >60)
Glucose, Bld: 106 mg/dL — ABNORMAL HIGH (ref 70–99)
Potassium: 4.2 mmol/L (ref 3.5–5.1)
Sodium: 140 mmol/L (ref 135–145)
Total Bilirubin: 1.6 mg/dL — ABNORMAL HIGH (ref 0.3–1.2)
Total Protein: 5.5 g/dL — ABNORMAL LOW (ref 6.5–8.1)

## 2020-03-20 LAB — CULTURE, BLOOD (ROUTINE X 2)
Culture: NO GROWTH
Culture: NO GROWTH
Special Requests: ADEQUATE
Special Requests: ADEQUATE

## 2020-03-20 LAB — MAGNESIUM: Magnesium: 1.6 mg/dL — ABNORMAL LOW (ref 1.7–2.4)

## 2020-03-20 MED ORDER — FUROSEMIDE 10 MG/ML IJ SOLN
40.0000 mg | Freq: Once | INTRAMUSCULAR | Status: AC
  Administered 2020-03-20: 40 mg via INTRAVENOUS
  Filled 2020-03-20: qty 4

## 2020-03-20 NOTE — Progress Notes (Signed)
Occupational Therapy Treatment Patient Details Name: [name] MRN: [id number] DOB: [birth year] Today's Date: 03/20/2020    History of present illness 79 y.o. male with medical history significant of multiple myeloma on chemotherapy, CAD, A. fib, hypertension presented with generalized weakness and increasing leg pain. Presents to ED after 2 falls in the last 2 days and c/o of chronic diarrhea. Admitted 03/15/20 for treatment of sepsis and weakness.   OT comments  Pt. Seen for skilled OT treatment session.  Easily awoken and agreeable to participation.  Able to complete 2 sets of 10 of B UE exercises as part of HEP.  Cues to initiate rest breaks.  Tolerated well.    Follow Up Recommendations  SNF;Supervision/Assistance - 24 hour    Equipment Recommendations  Other (comment)    Recommendations for Other Services      Precautions / Restrictions Precautions Precautions: Fall       Mobility Bed Mobility                  Transfers                      Balance                                           ADL either performed or assessed with clinical judgement   ADL                                               Vision       Perception     Praxis      Cognition Arousal/Alertness: Awake/alert Behavior During Therapy: [facility] for tasks assessed/performed                                            Exercises General Exercises - Upper Extremity Shoulder Flexion: AROM;Both;Supine;10 reps Shoulder Extension: AROM;Both;Supine;10 reps Elbow Flexion: AROM;Both;Supine;10 reps Elbow Extension: AROM;Both;Supine;10 reps   Shoulder Instructions       General Comments      Pertinent Vitals/ Pain       Pain Assessment: No/denies pain  Home Living                                          Prior Functioning/Environment              Frequency  Min 2X/week        Progress Toward  Goals  OT Goals(current goals can now be found in the care plan section)  Progress towards OT goals: Progressing toward goals     Plan      Co-evaluation                 AM-PAC OT "6 Clicks" Daily Activity     Outcome Measure   Help from another person eating meals?: A Lot Help from another person taking care of personal grooming?: A Lot Help from another person toileting, which includes using toliet, bedpan, or urinal?: Total Help from another person bathing (including washing, rinsing, drying)?:  Total Help from another person to put on and taking off regular upper body clothing?: Total Help from another person to put on and taking off regular lower body clothing?: Total 6 Click Score: 8    End of Session Equipment Utilized During Treatment: Oxygen  OT Visit Diagnosis: Unsteadiness on feet (R26.81);Muscle weakness (generalized) (M62.81);Pain   Activity Tolerance Patient tolerated treatment well   Patient Left in bed;with call bell/phone within reach   Nurse Communication Other (comment) (alerted rn that iv was leaking he came and fixed it during session)        Time: 1251-1301 OT Time Calculation (min): 10 min  Charges: OT General Charges $OT Visit: 1 Visit OT Treatments $Therapeutic Exercise: 8-22 mins  Sonia Baller, [place]   Janice Coffin 03/20/2020, 1:01 PM

## 2020-03-20 NOTE — Progress Notes (Signed)
[facility]  PROGRESS NOTE    [name]  SHF:[id number] DOB: [birth year] DOA: 03/15/2020 PCP: [name], MD   Brief Narrative:   79 year old male with history of multiple myeloma on chemotherapy, CAD, A. fib, hypertension presented with generalized weakness and increasing leg pain on 03/15/2020. On presentation, he had a low-grade temperature his blood pressure was on the lower side but there was no tachycardia or hypoxia. Urinalysis was unremarkable; chest x-ray showed no infiltrate. He is slightly elevated lactic acid. He was started on broad-spectrum antibiotics and IV fluids  7/9: Cefepime discontinued yesterday. Given CT results, lasix added and broad spec abx w/ vanc/zosyn/azithro were added. RVP negative and MRSA swab negative. Sputum culture pending. Bicarb improving on supplemental bicarb. Continue to follow. Continue Mg2+ replacement. Mentation is better. He's A&O x 3 today, but still sluggish in response. Overall improvement from yesterday.   Assessment & Plan:   Active Problems:   Multiple myeloma ([place])   Essential hypertension   Atrial tachycardia/fibrillation   PACEMAKER, [employer] dual-chamber   Coronary artery disease-nonobstructive catheter in October 2011   Sepsis ([place])   Pressure injury of skin   Immunosuppressed due to chemotherapy   Acquired thrombophilia ([place])   Generalized weakness   Frequent falls   Long term (current) use of anticoagulants   Lactic acidosis   Thrombocytopenia ([facility])   Hypoalbuminemia   Hyperbilirubinemia   Demand ischemia (H[facility])   Elevated brain natriuretic peptide (BNP) level   Elevated procalcitonin   Hypokalemia   Anemia of chronic disease   Scalp hematoma   Obesity (BMI 30.0-34.9)  Sepsis: Present on admission - Unclear source. - tachypnea, hypotension, elevated lactic acid at admission - UA was unremarkable. Chest x-ray showed no infiltrates. No cellulitic changes. CT of the abdomen and pelvis with contrast showed  diverticulosis without diverticulitis. Bld Cx NTD. Procal elevated, but now down to 1.98 - Stool for C. difficile negative. COVID-19 testing negative. - was started on IV fluids, vancomycin and cefepime. Cultures negative so far.     - cefepime d/c'd d/t no source ID'd and mentation changes; CXR/CTA PE ordered as he has become hypoxic. Hold abx for now until source is ID'd.     - 7/9: CXR negative, CTA PE negative for PE, but showed "Patchy ground-glass opacities in the anterior and posterior right upper lobe may be pulmonary edema or infectious, including atypical viral organisms." It also showed "Small to moderate bilateral pleural effusions with compressive atelectasis. Mild pulmonary edema." RVP was negative. Broad spec abx resumed. He will receive lasix IV today. Continue broad spec abx. Follow temp curve and WBC. Sputum Cx pending.   Multiple myeloma, currently on chemotherapy - follows with Dr. Earlie Server - per onco: "smoldering multiple myeloma that was recently started on systemic therapy with subcutaneous Velcade, Revlimid and Decadron because of the progressive nature of his disease. The patient has been tolerating this treatment well with no concerning adverse effects and he is status post 3 cycles." - per onco: "strongly recommend for the patient to discontinue his systemic chemotherapy for the multiple myeloma at this point. I will arrange for him a follow-up visit with me in few weeks after his discharge for evaluation and repeat myeloma panel."  Thrombocytopenia - Probably from above. - no evidence of bleed, monitor     - resolved  Hypokalemia Hypomagnesemia - start K+ 5mq BID; Mg2+ is ok     - 7/9: K+ is ok today, decrease dosing. Continue Mg2+ supplement.  Generalized weakness -  PT recommends SNF placement. Social worker consult  Hypertension - BP still soft, hold home BP meds for now     - 7/9: given IV lasix 47m this AM;  BP tolerating.   Paroxysmal A. fib - Paced rhythm. Continue Xarelto - echo:Left Ventricle: Left ventricular ejection fraction, by estimation, is 55 to 60%. The left ventricle has normal function. The left ventricle has no regional wall motion abnormalities. Definity contrast agent was given IV to delineate the left ventricular  endocardial borders. The left ventricular internal cavity size was normal in size. There is mild left ventricular hypertrophy. Left ventricular diastolic parameters are indeterminate.  Dyslipidemia - Continue statin  Metabolic acidosis - follow up lactic acid - bicarb 13056mTID; lactic acid resolved     - 7/9: likely an element driving his respiratory alkalosis: Bicarb is up to 17 today; continue supplement.  DVT prophylaxis: xarelto Code Status: FULL Family Communication: Spoke with son by phone   Status is: Inpatient  Remains inpatient appropriate because:Inpatient level of care appropriate due to severity of illness   Dispo: The patient is from: Home              Anticipated d/c is to: SNF              Anticipated d/c date is: > 3 days              Patient currently is not medically stable to d/c.  Consultants:   Oncology  Antimicrobials:  . Vanc, zosyn, azithro   ROS:  Reports some dyspnea, denies CP, N, V, ab pain . Remainder ROS is negative for all not previously mentioned.  Subjective: "Just let my son know. Thank you."  Objective: Vitals:   03/19/20 2014 03/19/20 2208 03/20/20 0033 03/20/20 0500  BP: 121/64 102/70 120/62 (!) 111/55  Pulse: 70 70 70 69  Resp: (!) 26 (!) 25 (!) 24 (!) 24  Temp: 99.2 F (37.3 C) 99.4 F (37.4 C) 99.4 F (37.4 C) 98.4 F (36.9 C)  TempSrc: Axillary   Oral  SpO2: 90% 96% 94% 97%  Weight:      Height:        Intake/Output Summary (Last 24 hours) at 03/20/2020 0716 Last data filed at 03/20/2020 0600 Gross per 24 hour  Intake 710 ml  Output 250 ml  Net 460 ml   Filed  Weights   03/16/20 0424 03/17/20 0237 03/18/20 0338  Weight: 114.1 kg 107 kg 99.5 kg    Examination:  General: 89.o. male resting in bed in NAD Cardiovascular: RRR, +S1, S2, no m/g/r Respiratory: UAT, expiratory wheeze, normal WOB on 2L [place] GI: BS+, NDNT, no masses noted, no organomegaly noted MSK: No e/c/c Neuro: A&O x 3, hard of hearing, following commands  Data Reviewed: I have personally reviewed following labs and imaging studies.  CBC: Recent Labs  Lab 03/15/20 1113 03/16/20 0259 03/17/20 0349 03/18/20 0700 03/19/20 0247  WBC 9.9 9.6 9.2 9.2 9.5  NEUTROABS  --   --  5.4 4.9 5.2  HGB 11.2* 10.9* 10.3* 10.3* 10.8*  HCT 33.2* 30.8* 30.7* 30.0* 31.1*  MCV 98.5 96.9 97.8 96.5 96.0  PLT 93* 73* 82* 136* 16161 Basic Metabolic Panel: Recent Labs  Lab 03/15/20 1113 03/16/20 0259 03/17/20 0349 03/18/20 0700 03/19/20 0247  NA 136 135 137 139 138  K 3.5 3.4* 3.3* 3.2* 3.8  CL 109 111 112* 114* 114*  CO2 18* 16* 16* 14* 14*  GLUCOSE 100* 74  79 72 98  BUN 22 21 18 20 19   CREATININE 0.98 0.88 0.98 0.85 0.79  CALCIUM 8.4* 8.0* 8.0* 8.3* 8.3*  MG  --   --  1.7 1.7 1.6*   GFR: Estimated Creatinine Clearance: 87.1 mL/min (by C-G formula based on SCr of 0.79 mg/dL). Liver Function Tests: Recent Labs  Lab 03/15/20 1138 03/17/20 0349 03/19/20 0247  AST 20 67* 54*  ALT 20 27 33  ALKPHOS 37* 34* 34*  BILITOT 1.5* 1.7* 1.5*  PROT 5.6* 4.9* 5.0*  ALBUMIN 3.2* 2.4* 2.3*   No results for input(s): LIPASE, AMYLASE in the last 168 hours. Recent Labs  Lab 03/18/20 1648  AMMONIA 16   Coagulation Profile: Recent Labs  Lab 03/15/20 1334  INR 1.9*   Cardiac Enzymes: No results for input(s): CKTOTAL, CKMB, CKMBINDEX, TROPONINI in the last 168 hours. BNP (last 3 results) No results for input(s): PROBNP in the last 8760 hours. HbA1C: No results for input(s): HGBA1C in the last 72 hours. CBG: Recent Labs  Lab 03/15/20 1206  GLUCAP 91   Lipid Profile: No  results for input(s): CHOL, HDL, LDLCALC, TRIG, CHOLHDL, LDLDIRECT in the last 72 hours. Thyroid Function Tests: No results for input(s): TSH, T4TOTAL, FREET4, T3FREE, THYROIDAB in the last 72 hours. Anemia Panel: Recent Labs    03/19/20 0247  VITAMINB12 531  FOLATE 20.9   Sepsis Labs: Recent Labs  Lab 03/15/20 1156 03/15/20 1334 03/15/20 1711 03/16/20 0259 03/17/20 0349 03/18/20 0700 03/19/20 1048 03/19/20 1612  PROCALCITON  --   --  2.19 4.28 3.74 1.98  --   --   LATICACIDVEN 2.8* 2.2*  --   --   --   --  1.8 1.6    Recent Results (from the past 240 hour(s))  Culture, blood (routine x 2)     Status: None (Preliminary result)   Collection Time: 03/15/20 12:08 PM   Specimen: BLOOD LEFT ARM  Result Value Ref Range Status   Specimen Description BLOOD LEFT ARM  Final   Special Requests   Final    BOTTLES DRAWN AEROBIC AND ANAEROBIC Blood Culture adequate volume   Culture   Final    NO GROWTH 4 DAYS Performed at [facility], [place] [address] [place], [place] [address]    Report Status PENDING  Incomplete  Culture, blood (routine x 2)     Status: None (Preliminary result)   Collection Time: 03/15/20 12:09 PM   Specimen: BLOOD  Result Value Ref Range Status   Specimen Description BLOOD LEFT ANTECUBITAL  Final   Special Requests   Final    BOTTLES DRAWN AEROBIC AND ANAEROBIC Blood Culture adequate volume   Culture   Final    NO GROWTH 4 DAYS Performed at [facility], [place] [address], [place], [place] [address]    Report Status PENDING  Incomplete  SARS Coronavirus 2 by RT PCR (hospital order, performed in [facility] hospital lab) Nasopharyngeal Nasopharyngeal Swab     Status: None   Collection Time: 03/15/20  2:39 PM   Specimen: Nasopharyngeal Swab  Result Value Ref Range Status   SARS Coronavirus 2 NEGATIVE NEGATIVE Final    Comment: (NOTE) SARS-CoV-2 target nucleic acids are NOT DETECTED.  The SARS-CoV-2 RNA is generally detectable in upper and  lower respiratory specimens during the acute phase of infection. The lowest concentration of SARS-CoV-2 viral copies this assay can detect is 250 copies / mL. A negative result does not preclude SARS-CoV-2 infection and  should not be used as the sole basis for treatment or other patient management decisions.  A negative result may occur with improper specimen collection / handling, submission of specimen other than nasopharyngeal swab, presence of viral mutation(s) within the areas targeted by this assay, and inadequate number of viral copies (<250 copies / mL). A negative result must be combined with clinical observations, patient history, and epidemiological information.  Fact Sheet for Patients:   StrictlyIdeas.no  Fact Sheet for Healthcare Providers: BankingDealers.co.za  This test is not yet approved or  cleared by the [place] FDA and has been authorized for detection and/or diagnosis of SARS-CoV-2 by FDA under an Emergency Use Authorization (EUA).  This EUA will remain in effect (meaning this test can be used) for the duration of the COVID-19 declaration under Section 564(b)(1) of the Act, 21 U.S.C. section 360bbb-3(b)(1), unless the authorization is terminated or revoked sooner.  Performed at [facility], [place] [address] [place], [place] [address]   Urine culture     Status: None   Collection Time: 03/15/20  2:45 PM   Specimen: In/Out Cath Urine  Result Value Ref Range Status   Specimen Description IN/OUT CATH URINE  Final   Special Requests NONE  Final   Culture   Final    NO GROWTH Performed at [facility], [place] [address], [place], [place] [address]    Report Status 03/16/2020 FINAL  Final  C Difficile Quick Screen w PCR reflex     Status: None   Collection Time: 03/16/20  5:43 AM   Specimen: STOOL  Result Value Ref Range Status   C Diff antigen NEGATIVE NEGATIVE Final   C Diff toxin NEGATIVE  NEGATIVE Final   C Diff interpretation No C. difficile detected.  Final    Comment: Performed at [facility], [place] [address], [place], [place] [address]  Respiratory Panel by PCR     Status: None   Collection Time: 03/19/20  7:22 PM   Specimen: Nasopharyngeal Swab; Respiratory  Result Value Ref Range Status   Adenovirus NOT DETECTED NOT DETECTED Final   Coronavirus 229E NOT DETECTED NOT DETECTED Final    Comment: (NOTE) The Coronavirus on the Respiratory Panel, DOES NOT test for the novel  Coronavirus (2019 nCoV)    Coronavirus HKU1 NOT DETECTED NOT DETECTED Final   Coronavirus NL63 NOT DETECTED NOT DETECTED Final   Coronavirus OC43 NOT DETECTED NOT DETECTED Final   Metapneumovirus NOT DETECTED NOT DETECTED Final   Rhinovirus / Enterovirus NOT DETECTED NOT DETECTED Final   Influenza A NOT DETECTED NOT DETECTED Final   Influenza B NOT DETECTED NOT DETECTED Final   Parainfluenza Virus 1 NOT DETECTED NOT DETECTED Final   Parainfluenza Virus 2 NOT DETECTED NOT DETECTED Final   Parainfluenza Virus 3 NOT DETECTED NOT DETECTED Final   Parainfluenza Virus 4 NOT DETECTED NOT DETECTED Final   Respiratory Syncytial Virus NOT DETECTED NOT DETECTED Final   Bordetella pertussis NOT DETECTED NOT DETECTED Final   Chlamydophila pneumoniae NOT DETECTED NOT DETECTED Final   Mycoplasma pneumoniae NOT DETECTED NOT DETECTED Final    Comment: Performed at [facility], [address] [place], [place] [address]  MRSA PCR Screening     Status: None   Collection Time: 03/19/20  7:22 PM   Specimen: Nasal Mucosa; Nasopharyngeal  Result Value Ref Range Status   MRSA by PCR NEGATIVE NEGATIVE Final    Comment:  The GeneXpert MRSA Assay (FDA approved for NASAL specimens only), is one component of a comprehensive MRSA colonization surveillance program. It is not intended to diagnose MRSA infection nor to guide or monitor treatment for MRSA infections. Performed at [place], [place] [address] [place], [place] [address]       Radiology Studies: CT HEAD WO CONTRAST  Result Date: 03/19/2020 CLINICAL DATA:  Encephalopathy EXAM: CT HEAD WITHOUT CONTRAST TECHNIQUE: Contiguous axial images were obtained from the base of the skull through the vertex without intravenous contrast. COMPARISON:  03/15/2020 FINDINGS: Brain: There is no mass, hemorrhage or extra-axial collection. The size and configuration of the ventricles and extra-axial CSF spaces are normal. There is hypoattenuation of the white matter, most commonly indicating chronic small vessel disease. Vascular: Atherosclerotic calcification of the vertebral and internal carotid arteries at the skull base. No abnormal hyperdensity of the major intracranial arteries or dural venous sinuses. Skull: Unchanged vertex scalp hematoma.  No skull fracture. Sinuses/Orbits: No fluid levels or advanced mucosal thickening of the visualized paranasal sinuses. No mastoid or middle ear effusion. The orbits are normal. IMPRESSION: 1. No acute intracranial abnormality. 2. Unchanged vertex scalp hematoma without skull fracture. 3. Chronic small vessel disease. Electronically Signed   By: Ulyses Jarred M.D.   On: 03/19/2020 02:43   CT ANGIO CHEST PE W OR WO CONTRAST  Result Date: 03/19/2020 CLINICAL DATA:  Hypoxemia. EXAM: CT ANGIOGRAPHY CHEST WITH CONTRAST TECHNIQUE: Multidetector CT imaging of the chest was performed using the standard protocol during bolus administration of intravenous contrast. Multiplanar CT image reconstructions and MIPs were obtained to evaluate the vascular anatomy. CONTRAST:  72m OMNIPAQUE IOHEXOL 350 MG/ML SOLN COMPARISON:  Chest radiograph earlier today. FINDINGS: Cardiovascular: Motion artifact limits subsegmental evaluation. There are no filling defects in the pulmonary arteries to the segmental level to suggest pulmonary embolus. Multi chamber cardiomegaly. Pacemaker in place. Contrast refluxes into the hepatic veins and  IVC. Coronary artery calcifications. Small pericardial effusion measures up to 9 mm in depth. Mediastinum/Nodes: Shotty mediastinal and hilar nodes largest in the right infrahilar region measuring 10 mm. No visualized thyroid nodule. No esophageal wall thickening. Lungs/Pleura: Small to moderate bilateral pleural effusions with compressive atelectasis. Patchy ground-glass opacities in the anterior and posterior right upper lobe. Mild apical predominant emphysema. Scattered smooth septal thickening suggesting pulmonary edema. Motion limits detailed parenchymal assessment. The trachea and central bronchi are grossly patent allowing for motion. Upper Abdomen: Contrast refluxing into the hepatic veins and IVC. Colonic diverticulosis. Tiny hiatal hernia. Musculoskeletal: Multilevel degenerative change in the spine. There are no acute or suspicious osseous abnormalities. Review of the MIP images confirms the above findings. IMPRESSION: 1. No pulmonary embolus allowing for motion artifact. 2. Multi chamber cardiomegaly with small pericardial effusion. Contrast refluxing into the hepatic veins and IVC consistent with elevated right heart pressures. 3. Small to moderate bilateral pleural effusions with compressive atelectasis. Mild pulmonary edema. 4. Patchy ground-glass opacities in the anterior and posterior right upper lobe may be pulmonary edema or infectious, including atypical viral organisms. 5. Shotty mediastinal and hilar nodes are likely reactive. Aortic Atherosclerosis (ICD10-I70.0) and Emphysema (ICD10-J43.9). Electronically Signed   By: MKeith RakeM.D.   On: 03/19/2020 17:49   DG CHEST PORT 1 VIEW  Result Date: 03/19/2020 CLINICAL DATA:  Wheezing.  Hypertension.  Atrial flutter EXAM: PORTABLE CHEST 1 VIEW COMPARISON:  March 15, 2020 FINDINGS: There is cardiomegaly with pulmonary vascularity normal. Pacemaker leads are attached to the right atrium and right  ventricle. There is slight bibasilar atelectasis.  No evident edema or airspace opacity. There is aortic atherosclerosis. No adenopathy. No bone lesions. There is calcification in the right carotid artery. IMPRESSION: Stable cardiomegaly. Pacemaker leads attached to right atrium and right ventricle. Areas of mild bibasilar atelectasis. No edema or airspace opacity. Aortic Atherosclerosis (ICD10-I70.0). Foci of right carotid artery calcification noted. Electronically Signed   By: Lowella Grip III M.D.   On: 03/19/2020 13:15     Scheduled Meds: . acyclovir  200 mg Oral BID  . atorvastatin  10 mg Oral q1800  . azithromycin  500 mg Oral Daily  . ciprofloxacin  1 drop Both Eyes Q4H while awake  . finasteride  5 mg Oral Daily  . Gerhardt's butt cream   Topical QID  . magnesium oxide  200 mg Oral Daily  . melatonin  3 mg Oral QHS  . multivitamin with minerals  1 tablet Oral Daily  . mupirocin ointment  1 application Topical Daily  . omega-3 acid ethyl esters  1 g Oral Daily  . potassium chloride  40 mEq Oral BID  . rivaroxaban  20 mg Oral Q supper  . sodium bicarbonate  1,300 mg Oral TID  . sodium chloride flush  3 mL Intravenous Once  . thiamine  100 mg Oral Daily   Continuous Infusions: . piperacillin-tazobactam (ZOSYN)  IV 3.375 g (03/20/20 0505)  . vancomycin       LOS: 5 days    Time spent: 35 minutes spent in the coordination of care today.    Jonnie Finner, DO [facility]  If 7PM-7AM, please contact night-coverage [email or web address] 03/20/2020, 7:16 AM

## 2020-03-21 LAB — CBC WITH DIFFERENTIAL/PLATELET
Abs Immature Granulocytes: 0.11 10*3/uL — ABNORMAL HIGH (ref 0.00–0.07)
Basophils Absolute: 0.1 10*3/uL (ref 0.0–0.1)
Basophils Relative: 1 %
Eosinophils Absolute: 0.9 10*3/uL — ABNORMAL HIGH (ref 0.0–0.5)
Eosinophils Relative: 9 %
HCT: 30.1 % — ABNORMAL LOW (ref 39.0–52.0)
Hemoglobin: 10.3 g/dL — ABNORMAL LOW (ref 13.0–17.0)
Immature Granulocytes: 1 %
Lymphocytes Relative: 15 %
Lymphs Abs: 1.5 10*3/uL (ref 0.7–4.0)
MCH: 32.8 pg (ref 26.0–34.0)
MCHC: 34.2 g/dL (ref 30.0–36.0)
MCV: 95.9 fL (ref 80.0–100.0)
Monocytes Absolute: 1.9 10*3/uL — ABNORMAL HIGH (ref 0.1–1.0)
Monocytes Relative: 19 %
Neutro Abs: 5.5 10*3/uL (ref 1.7–7.7)
Neutrophils Relative %: 55 %
Platelets: 260 10*3/uL (ref 150–400)
RBC: 3.14 MIL/uL — ABNORMAL LOW (ref 4.22–5.81)
RDW: 13.4 % (ref 11.5–15.5)
WBC: 10.1 10*3/uL (ref 4.0–10.5)
nRBC: 0 % (ref 0.0–0.2)

## 2020-03-21 LAB — MAGNESIUM: Magnesium: 1.5 mg/dL — ABNORMAL LOW (ref 1.7–2.4)

## 2020-03-21 LAB — COMPREHENSIVE METABOLIC PANEL
ALT: 39 U/L (ref 0–44)
AST: 63 U/L — ABNORMAL HIGH (ref 15–41)
Albumin: 2.4 g/dL — ABNORMAL LOW (ref 3.5–5.0)
Alkaline Phosphatase: 42 U/L (ref 38–126)
Anion gap: 10 (ref 5–15)
BUN: 20 mg/dL (ref 8–23)
CO2: 17 mmol/L — ABNORMAL LOW (ref 22–32)
Calcium: 8.5 mg/dL — ABNORMAL LOW (ref 8.9–10.3)
Chloride: 114 mmol/L — ABNORMAL HIGH (ref 98–111)
Creatinine, Ser: 0.92 mg/dL (ref 0.61–1.24)
GFR calc Af Amer: >60 mL/min (ref >60)
GFR calc non Af Amer: >60 mL/min (ref >60)
Glucose, Bld: 109 mg/dL — ABNORMAL HIGH (ref 70–99)
Potassium: 3.9 mmol/L (ref 3.5–5.1)
Sodium: 141 mmol/L (ref 135–145)
Total Bilirubin: 1.5 mg/dL — ABNORMAL HIGH (ref 0.3–1.2)
Total Protein: 5.3 g/dL — ABNORMAL LOW (ref 6.5–8.1)

## 2020-03-21 MED ORDER — MAGNESIUM SULFATE 2 GM/50ML IV SOLN
2.0000 g | Freq: Once | INTRAVENOUS | Status: AC
  Administered 2020-03-21: 2 g via INTRAVENOUS
  Filled 2020-03-21: qty 50

## 2020-03-21 MED ORDER — FUROSEMIDE 10 MG/ML IJ SOLN
40.0000 mg | Freq: Once | INTRAMUSCULAR | Status: AC
  Administered 2020-03-21: 40 mg via INTRAVENOUS
  Filled 2020-03-21: qty 4

## 2020-03-21 NOTE — Progress Notes (Signed)
Ebony Hail SW is requesting ( per [name] )  for a note indicating pt is competent to decide for himself. Dr. Marylyn Ishihara updated via Shea Evans of request.

## 2020-03-21 NOTE — Progress Notes (Signed)
PROGRESS NOTE    [name]  VOH:[id number] DOB: [birth year] DOA: 03/15/2020 PCP: [name], MD   Brief Narrative:   79 year old male with history of multiple myeloma on chemotherapy, CAD, A. fib, hypertension presented with generalized weakness and increasing leg pain on 03/15/2020. On presentation, he had a low-grade temperature his blood pressure was on the lower side but there was no tachycardia or hypoxia. Urinalysis was unremarkable; chest x-ray showed no infiltrate. He is slightly elevated lactic acid. He was started on broad-spectrum antibiotics and IV fluids  7/10: A&O x 3 this morning. Interacting more today. Looking better overall. Still no source of infection. Getting another dose of lasix today. Continue Bicarb. Hold vanc.    Assessment & Plan:   Active Problems:   Multiple myeloma ([place])   Essential hypertension   Atrial tachycardia/fibrillation   PACEMAKER, [employer] dual-chamber   Coronary artery disease-nonobstructive catheter in October 2011   Sepsis ([place])   Pressure injury of skin   Immunosuppressed due to chemotherapy   Acquired thrombophilia ([place])   Generalized weakness   Frequent falls   Long term (current) use of anticoagulants   Lactic acidosis   Thrombocytopenia ([facility])   Hypoalbuminemia   Hyperbilirubinemia   Demand ischemia ([facility])   Elevated brain natriuretic peptide (BNP) level   Elevated procalcitonin   Hypokalemia   Anemia of chronic disease   Scalp hematoma   Obesity (BMI 30.0-34.9)  Sepsis: Present on admission - Unclear source. - tachypnea, hypotension, elevated lactic acid at admission - UA was unremarkable. Chest x-ray showed no infiltrates. No cellulitic changes. CT of the abdomen and pelvis with contrast showed diverticulosis without diverticulitis. Bld Cx NTD. Procal elevated, but now down to 1.98 - Stool for C. difficile negative. COVID-19 testing negative. - was started on IV fluids, vancomycin and  cefepime. Cultures negative so far.     - cefepime d/c'd d/t no source ID'd and mentation changes; CXR/CTA PE ordered as he has become hypoxic. Hold abx for now until source is ID'd.     - CXR negative, CTA PE negative for PE, but showed "Patchy ground-glass opacities in the anterior and posterior right upper lobe may be pulmonary edema or infectious, including atypical viral organisms." It also showed "Small to moderate bilateral pleural effusions with compressive atelectasis. Mild pulmonary edema." RVP was negative. Broad spec abx resumed. He will received lasix IV.     - 7/10: No fevers. WBC ok. MRSA swab negative. Hold vanc. Azithro has completed. More interactive this AM. Acute illness is presenting him from being completely mentally clear this AM. He is A&O x 3, but still with confusion.    Multiple myeloma, currently on chemotherapy - follows with Dr. Earlie Server - per onco: "smoldering multiple myeloma that was recently started on systemic therapy with subcutaneous Velcade, Revlimid and Decadron because of the progressive nature of his disease. The patient has been tolerating this treatment well with no concerning adverse effects and he is status post 3 cycles." - per onco: "strongly recommend for the patient to discontinue his systemic chemotherapy for the multiple myeloma at this point. I will arrange for him a follow-up visit with me in few weeks after his discharge for evaluation and repeat myeloma panel."  Thrombocytopenia - Probably from above. - no evidence of bleed, monitor - resolved  Hypokalemia Hypomagnesemia - start K+ 16mq BID; Mg2+ is ok - 7/10: K+ stable, add IV Mg2+ today, follow  Generalized weakness - PT recommends SNF placement. Social worker consult  Hypertension - BP still soft, hold home BP meds for now     - 7/10: Getting additional lasix today; start resuming some home meds   Paroxysmal A. fib - Paced  rhythm. Continue Xarelto - echo:Left Ventricle: Left ventricular ejection fraction, by estimation, is 55 to 60%. The left ventricle has normal function. The left ventricle has no regional wall motion abnormalities. Definity contrast agent was given IV to delineate the left ventricular  endocardial borders. The left ventricular internal cavity size was normal in size. There is mild left ventricular hypertrophy. Left ventricular diastolic parameters are indeterminate.  Dyslipidemia - Continue statin  Metabolic acidosis - follow up lactic acid - bicarb 1347m TID; lactic acid resolved - 7/10: Bicarb holding at 17 today; continue supplement  DVT prophylaxis: xarelto Code Status: FULL Family Communication: None at bedside   Status is: Inpatient  Remains inpatient appropriate because:Inpatient level of care appropriate due to severity of illness   Dispo: The patient is from: Home              Anticipated d/c is to: SNF              Anticipated d/c date is: 3 days              Patient currently is not medically stable to d/c.  Consultants:   Oncology  Antimicrobials:  . Acyclovir, zosyn   ROS:  Denies CP, N, V, ab pain, dyspnea . Remainder 10-pt ROS is negative for all not previously mentioned.  Subjective: [facility]  Objective: Vitals:   03/20/20 2300 03/21/20 0017 03/21/20 0447 03/21/20 0756  BP:  123/74 (!) 126/53 139/64  Pulse: 69 69 70 70  Resp: _0 Temp:  98.8 F (37.1 C) 99 F (37.2 C) 98.5 F (36.9 C)  TempSrc:  Axillary Axillary Oral  SpO2: 95% 94% 96% 97%  Weight:   101.6 kg   Height:        Intake/Output Summary (Last 24 hours) at 03/21/2020 0802 Last data filed at 03/21/2020 0341 Gross per 24 hour  Intake 592 ml  Output 875 ml  Net -283 ml   Filed Weights   03/17/20 0237 03/18/20 0338 03/21/20 0447  Weight: 107 kg 99.5 kg 101.6 kg   Examination:  General: 79y.o. male  resting in bed in NAD Cardiovascular: RRR, +S1, S2, no m/g/r, equal pulses throughout Respiratory: exp wheeze improved, no r/r, normal WOB on 2L [place] GI: BS+, NDNT, soft MSK: No e/c/c Neuro: A&O x 3, hard of hearing, follows commands Psyc: calm/cooperative  Data Reviewed: I have personally reviewed following labs and imaging studies.  CBC: Recent Labs  Lab 03/17/20 0349 03/18/20 0700 03/19/20 0247 03/20/20 1216 03/21/20 0348  WBC 9.2 9.2 9.5 10.3 10.1  NEUTROABS 5.4 4.9 5.2 5.1 5.5  HGB 10.3* 10.3* 10.8* 10.6* 10.3*  HCT 30.7* 30.0* 31.1* 30.8* 30.1*  MCV 97.8 96.5 96.0 96.9 95.9  PLT 82* 136* 164 250 2546  Basic Metabolic Panel: Recent Labs  Lab 03/17/20 0349 03/18/20 0700 03/19/20 0247 03/20/20 1216 03/21/20 0348  NA 137 139 138 140 141  K 3.3* 3.2* 3.8 4.2 3.9  CL 112* 114* 114* 112* 114*  CO2 16* 14* 14* 17* 17*  GLUCOSE 79 72 98 106* 109*  BUN _1 CREATININE 0.98 0.85 0.79 0.87 0.92  CALCIUM 8.0* 8.3* 8.3* 8.5* 8.5*  MG 1.7 1.7 1.6* 1.6* 1.5*  GFR: Estimated Creatinine Clearance: 76.4 mL/min (by C-G formula based on SCr of 0.92 mg/dL). Liver Function Tests: Recent Labs  Lab 03/15/20 1138 03/17/20 0349 03/19/20 0247 03/20/20 1216 03/21/20 0348  AST 20 67* 54* 63* 63*  ALT 20 27 33 38 39  ALKPHOS 37* 34* 34* 36* 42  BILITOT 1.5* 1.7* 1.5* 1.6* 1.5*  PROT 5.6* 4.9* 5.0* 5.5* 5.3*  ALBUMIN 3.2* 2.4* 2.3* 2.6* 2.4*   No results for input(s): LIPASE, AMYLASE in the last 168 hours. Recent Labs  Lab 03/18/20 1648  AMMONIA 16   Coagulation Profile: Recent Labs  Lab 03/15/20 1334  INR 1.9*   Cardiac Enzymes: No results for input(s): CKTOTAL, CKMB, CKMBINDEX, TROPONINI in the last 168 hours. BNP (last 3 results) No results for input(s): PROBNP in the last 8760 hours. HbA1C: No results for input(s): HGBA1C in the last 72 hours. CBG: Recent Labs  Lab 03/15/20 1206  GLUCAP 91   Lipid Profile: No results for input(s): CHOL, HDL,  LDLCALC, TRIG, CHOLHDL, LDLDIRECT in the last 72 hours. Thyroid Function Tests: No results for input(s): TSH, T4TOTAL, FREET4, T3FREE, THYROIDAB in the last 72 hours. Anemia Panel: Recent Labs    03/19/20 0247  VITAMINB12 531  FOLATE 20.9   Sepsis Labs: Recent Labs  Lab 03/15/20 1156 03/15/20 1334 03/15/20 1711 03/16/20 0259 03/17/20 0349 03/18/20 0700 03/19/20 1048 03/19/20 1612  PROCALCITON  --   --  2.19 4.28 3.74 1.98  --   --   LATICACIDVEN 2.8* 2.2*  --   --   --   --  1.8 1.6    Recent Results (from the past 240 hour(s))  Culture, blood (routine x 2)     Status: None   Collection Time: 03/15/20 12:08 PM   Specimen: BLOOD LEFT ARM  Result Value Ref Range Status   Specimen Description BLOOD LEFT ARM  Final   Special Requests   Final    BOTTLES DRAWN AEROBIC AND ANAEROBIC Blood Culture adequate volume   Culture   Final    NO GROWTH 5 DAYS Performed at [facility], [place] [address] [place], [place] [address]    Report Status 03/20/2020 FINAL  Final  Culture, blood (routine x 2)     Status: None   Collection Time: 03/15/20 12:09 PM   Specimen: BLOOD  Result Value Ref Range Status   Specimen Description BLOOD LEFT ANTECUBITAL  Final   Special Requests   Final    BOTTLES DRAWN AEROBIC AND ANAEROBIC Blood Culture adequate volume   Culture   Final    NO GROWTH 5 DAYS Performed at [facility], [place] [address] [place], [place] [address]    Report Status 03/20/2020 FINAL  Final  SARS Coronavirus 2 by RT PCR (hospital order, performed in [facility] hospital lab) Nasopharyngeal Nasopharyngeal Swab     Status: None   Collection Time: 03/15/20  2:39 PM   Specimen: Nasopharyngeal Swab  Result Value Ref Range Status   SARS Coronavirus 2 NEGATIVE NEGATIVE Final    Comment: (NOTE) SARS-CoV-2 target nucleic acids are NOT DETECTED.  The SARS-CoV-2 RNA is generally detectable in upper and lower respiratory specimens during the acute phase of infection. The  lowest concentration of SARS-CoV-2 viral copies this assay can detect is 250 copies / mL. A negative result does not preclude SARS-CoV-2 infection and should not be used as the sole basis for treatment or other patient management decisions.  A negative result may occur with improper  specimen collection / handling, submission of specimen other than nasopharyngeal swab, presence of viral mutation(s) within the areas targeted by this assay, and inadequate number of viral copies (<250 copies / mL). A negative result must be combined with clinical observations, patient history, and epidemiological information.  Fact Sheet for Patients:   StrictlyIdeas.no  Fact Sheet for Healthcare Providers: BankingDealers.co.za  This test is not yet approved or  cleared by the [place] FDA and has been authorized for detection and/or diagnosis of SARS-CoV-2 by FDA under an Emergency Use Authorization (EUA).  This EUA will remain in effect (meaning this test can be used) for the duration of the COVID-19 declaration under Section 564(b)(1) of the Act, 21 U.S.C. section 360bbb-3(b)(1), unless the authorization is terminated or revoked sooner.  Performed at [facility], [place] [address], [place], [place] [address]   Urine culture     Status: None   Collection Time: 03/15/20  2:45 PM   Specimen: In/Out Cath Urine  Result Value Ref Range Status   Specimen Description IN/OUT CATH URINE  Final   Special Requests NONE  Final   Culture   Final    NO GROWTH Performed at [facility], [place] [address], [place], [place] [address]    Report Status 03/16/2020 FINAL  Final  C Difficile Quick Screen w PCR reflex     Status: None   Collection Time: 03/16/20  5:43 AM   Specimen: STOOL  Result Value Ref Range Status   C Diff antigen NEGATIVE NEGATIVE Final   C Diff toxin NEGATIVE NEGATIVE Final   C Diff interpretation No C. difficile detected.  Final     Comment: Performed at [facility], [place] [address], [place], [place] [address]  Respiratory Panel by PCR     Status: None   Collection Time: 03/19/20  7:22 PM   Specimen: Nasopharyngeal Swab; Respiratory  Result Value Ref Range Status   Adenovirus NOT DETECTED NOT DETECTED Final   Coronavirus 229E NOT DETECTED NOT DETECTED Final    Comment: (NOTE) The Coronavirus on the Respiratory Panel, DOES NOT test for the novel  Coronavirus (2019 nCoV)    Coronavirus HKU1 NOT DETECTED NOT DETECTED Final   Coronavirus NL63 NOT DETECTED NOT DETECTED Final   Coronavirus OC43 NOT DETECTED NOT DETECTED Final   Metapneumovirus NOT DETECTED NOT DETECTED Final   Rhinovirus / Enterovirus NOT DETECTED NOT DETECTED Final   Influenza A NOT DETECTED NOT DETECTED Final   Influenza B NOT DETECTED NOT DETECTED Final   Parainfluenza Virus 1 NOT DETECTED NOT DETECTED Final   Parainfluenza Virus 2 NOT DETECTED NOT DETECTED Final   Parainfluenza Virus 3 NOT DETECTED NOT DETECTED Final   Parainfluenza Virus 4 NOT DETECTED NOT DETECTED Final   Respiratory Syncytial Virus NOT DETECTED NOT DETECTED Final   Bordetella pertussis NOT DETECTED NOT DETECTED Final   Chlamydophila pneumoniae NOT DETECTED NOT DETECTED Final   Mycoplasma pneumoniae NOT DETECTED NOT DETECTED Final    Comment: Performed at [facility], [address] [place], [place] [address]  MRSA PCR Screening     Status: None   Collection Time: 03/19/20  7:22 PM   Specimen: Nasal Mucosa; Nasopharyngeal  Result Value Ref Range Status   MRSA by PCR NEGATIVE NEGATIVE Final    Comment:        The GeneXpert MRSA Assay (FDA approved for NASAL specimens only), is one component of a comprehensive MRSA colonization surveillance program. It is  not intended to diagnose MRSA infection nor to guide or monitor treatment for MRSA infections. Performed at [facility], [place] [address] [place], [place] [address]       Radiology Studies: CT  ANGIO CHEST PE W OR WO CONTRAST  Result Date: 03/19/2020 CLINICAL DATA:  Hypoxemia. EXAM: CT ANGIOGRAPHY CHEST WITH CONTRAST TECHNIQUE: Multidetector CT imaging of the chest was performed using the standard protocol during bolus administration of intravenous contrast. Multiplanar CT image reconstructions and MIPs were obtained to evaluate the vascular anatomy. CONTRAST:  38m OMNIPAQUE IOHEXOL 350 MG/ML SOLN COMPARISON:  Chest radiograph earlier today. FINDINGS: Cardiovascular: Motion artifact limits subsegmental evaluation. There are no filling defects in the pulmonary arteries to the segmental level to suggest pulmonary embolus. Multi chamber cardiomegaly. Pacemaker in place. Contrast refluxes into the hepatic veins and IVC. Coronary artery calcifications. Small pericardial effusion measures up to 9 mm in depth. Mediastinum/Nodes: Shotty mediastinal and hilar nodes largest in the right infrahilar region measuring 10 mm. No visualized thyroid nodule. No esophageal wall thickening. Lungs/Pleura: Small to moderate bilateral pleural effusions with compressive atelectasis. Patchy ground-glass opacities in the anterior and posterior right upper lobe. Mild apical predominant emphysema. Scattered smooth septal thickening suggesting pulmonary edema. Motion limits detailed parenchymal assessment. The trachea and central bronchi are grossly patent allowing for motion. Upper Abdomen: Contrast refluxing into the hepatic veins and IVC. Colonic diverticulosis. Tiny hiatal hernia. Musculoskeletal: Multilevel degenerative change in the spine. There are no acute or suspicious osseous abnormalities. Review of the MIP images confirms the above findings. IMPRESSION: 1. No pulmonary embolus allowing for motion artifact. 2. Multi chamber cardiomegaly with small pericardial effusion. Contrast refluxing into the hepatic veins and IVC consistent with elevated right heart pressures. 3. Small to moderate bilateral pleural effusions with  compressive atelectasis. Mild pulmonary edema. 4. Patchy ground-glass opacities in the anterior and posterior right upper lobe may be pulmonary edema or infectious, including atypical viral organisms. 5. Shotty mediastinal and hilar nodes are likely reactive. Aortic Atherosclerosis (ICD10-I70.0) and Emphysema (ICD10-J43.9). Electronically Signed   By: MKeith RakeM.D.   On: 03/19/2020 17:49   DG CHEST PORT 1 VIEW  Result Date: 03/19/2020 CLINICAL DATA:  Wheezing.  Hypertension.  Atrial flutter EXAM: PORTABLE CHEST 1 VIEW COMPARISON:  March 15, 2020 FINDINGS: There is cardiomegaly with pulmonary vascularity normal. Pacemaker leads are attached to the right atrium and right ventricle. There is slight bibasilar atelectasis. No evident edema or airspace opacity. There is aortic atherosclerosis. No adenopathy. No bone lesions. There is calcification in the right carotid artery. IMPRESSION: Stable cardiomegaly. Pacemaker leads attached to right atrium and right ventricle. Areas of mild bibasilar atelectasis. No edema or airspace opacity. Aortic Atherosclerosis (ICD10-I70.0). Foci of right carotid artery calcification noted. Electronically Signed   By: WLowella GripIII M.D.   On: 03/19/2020 13:15     Scheduled Meds: . acyclovir  200 mg Oral BID  . atorvastatin  10 mg Oral q1800  . azithromycin  500 mg Oral Daily  . ciprofloxacin  1 drop Both Eyes Q4H while awake  . finasteride  5 mg Oral Daily  . furosemide  40 mg Intravenous Once  . Gerhardt's butt cream   Topical QID  . magnesium oxide  200 mg Oral Daily  . melatonin  3 mg Oral QHS  . multivitamin with minerals  1 tablet Oral Daily  . mupirocin ointment  1 application Topical Daily  . omega-3 acid ethyl esters  1 g Oral Daily  .  potassium chloride  40 mEq Oral BID  . rivaroxaban  20 mg Oral Q supper  . sodium bicarbonate  1,300 mg Oral TID  . sodium chloride flush  3 mL Intravenous Once  . thiamine  100 mg Oral Daily   Continuous  Infusions: . piperacillin-tazobactam (ZOSYN)  IV 3.375 g (03/21/20 0512)  . vancomycin 750 mg (03/20/20 2038)     LOS: 6 days    Time spent: 25 minutes spent in the coordination of care today.    Jonnie Finner, DO [facility]  If 7PM-7AM, please contact night-coverage [email or web address] 03/21/2020, 8:02 AM

## 2020-03-22 ENCOUNTER — Inpatient Hospital Stay (HOSPITAL_COMMUNITY): Payer: Medicare HMO

## 2020-03-22 DIAGNOSIS — J8489 Other specified interstitial pulmonary diseases: Secondary | ICD-10-CM

## 2020-03-22 LAB — COMPREHENSIVE METABOLIC PANEL
ALT: 36 U/L (ref 0–44)
AST: 54 U/L — ABNORMAL HIGH (ref 15–41)
Albumin: 2.4 g/dL — ABNORMAL LOW (ref 3.5–5.0)
Alkaline Phosphatase: 43 U/L (ref 38–126)
Anion gap: 12 (ref 5–15)
BUN: 21 mg/dL (ref 8–23)
CO2: 21 mmol/L — ABNORMAL LOW (ref 22–32)
Calcium: 8.6 mg/dL — ABNORMAL LOW (ref 8.9–10.3)
Chloride: 110 mmol/L (ref 98–111)
Creatinine, Ser: 0.89 mg/dL (ref 0.61–1.24)
GFR calc Af Amer: >60 mL/min (ref >60)
GFR calc non Af Amer: >60 mL/min (ref >60)
Glucose, Bld: 98 mg/dL (ref 70–99)
Potassium: 4 mmol/L (ref 3.5–5.1)
Sodium: 143 mmol/L (ref 135–145)
Total Bilirubin: 1 mg/dL (ref 0.3–1.2)
Total Protein: 5.9 g/dL — ABNORMAL LOW (ref 6.5–8.1)

## 2020-03-22 LAB — CBC WITH DIFFERENTIAL/PLATELET
Abs Immature Granulocytes: 0.16 10*3/uL — ABNORMAL HIGH (ref 0.00–0.07)
Basophils Absolute: 0.1 10*3/uL (ref 0.0–0.1)
Basophils Relative: 2 %
Eosinophils Absolute: 0.7 10*3/uL — ABNORMAL HIGH (ref 0.0–0.5)
Eosinophils Relative: 8 %
HCT: 32.7 % — ABNORMAL LOW (ref 39.0–52.0)
Hemoglobin: 11.1 g/dL — ABNORMAL LOW (ref 13.0–17.0)
Immature Granulocytes: 2 %
Lymphocytes Relative: 20 %
Lymphs Abs: 1.9 10*3/uL (ref 0.7–4.0)
MCH: 32.7 pg (ref 26.0–34.0)
MCHC: 33.9 g/dL (ref 30.0–36.0)
MCV: 96.5 fL (ref 80.0–100.0)
Monocytes Absolute: 1.2 10*3/uL — ABNORMAL HIGH (ref 0.1–1.0)
Monocytes Relative: 13 %
Neutro Abs: 5.2 10*3/uL (ref 1.7–7.7)
Neutrophils Relative %: 55 %
Platelets: 340 10*3/uL (ref 150–400)
RBC: 3.39 MIL/uL — ABNORMAL LOW (ref 4.22–5.81)
RDW: 13.4 % (ref 11.5–15.5)
WBC: 9.3 10*3/uL (ref 4.0–10.5)
nRBC: 0 % (ref 0.0–0.2)

## 2020-03-22 LAB — BRAIN NATRIURETIC PEPTIDE: B Natriuretic Peptide: 215.1 pg/mL — ABNORMAL HIGH (ref 0.0–100.0)

## 2020-03-22 LAB — MAGNESIUM: Magnesium: 2 mg/dL (ref 1.7–2.4)

## 2020-03-22 LAB — SEDIMENTATION RATE: Sed Rate: 98 mm/hr — ABNORMAL HIGH (ref 0–16)

## 2020-03-22 LAB — PROCALCITONIN: Procalcitonin: 0.27 ng/mL

## 2020-03-22 MED ORDER — SODIUM BICARBONATE 650 MG PO TABS
650.0000 mg | ORAL_TABLET | Freq: Three times a day (TID) | ORAL | Status: DC
  Administered 2020-03-22 – 2020-03-23 (×5): 650 mg via ORAL
  Filled 2020-03-22 (×5): qty 1

## 2020-03-22 MED ORDER — METOLAZONE 5 MG PO TABS: 5.0000 mg | ORAL_TABLET | Freq: Once | ORAL | Status: DC

## 2020-03-22 MED ORDER — FUROSEMIDE 10 MG/ML IJ SOLN: 40.0000 mg | Freq: Once | INTRAMUSCULAR | Status: DC

## 2020-03-22 MED ORDER — POTASSIUM CHLORIDE CRYS ER 20 MEQ PO TBCR
40.0000 meq | EXTENDED_RELEASE_TABLET | Freq: Every day | ORAL | Status: DC
  Administered 2020-03-23 – 2020-03-27 (×5): 40 meq via ORAL
  Filled 2020-03-22 (×5): qty 2

## 2020-03-22 MED ORDER — METHYLPREDNISOLONE SODIUM SUCC 40 MG IJ SOLR
40.0000 mg | Freq: Three times a day (TID) | INTRAMUSCULAR | Status: DC
  Administered 2020-03-22 – 2020-03-24 (×6): 40 mg via INTRAVENOUS
  Filled 2020-03-22 (×6): qty 1

## 2020-03-22 MED ORDER — FUROSEMIDE 40 MG PO TABS
40.0000 mg | ORAL_TABLET | Freq: Every day | ORAL | Status: DC
  Administered 2020-03-22: 40 mg via ORAL
  Filled 2020-03-22: qty 1

## 2020-03-22 NOTE — Progress Notes (Signed)
Pharmacy Antibiotic Note  [name] is a 79 y.o. male admitted on 03/15/2020 with generalized weakness, concern for infection of unknown source.  Pharmacy consulted 03/19/20 to restart vancomycin and start Zosyn dosing for pneumonia.  Vanc discontinued 7/10; Zosyn still on but with clinical improvement, unknown source, and cultures NGTD. Currently on day 8 of antibiotic therapy (completed 4 days of cefepime prior to switching to Zosyn).   Afebrile, WBC 9.3, LA 1.8>1.6  SCr 0.89, CrCl 77 ml/min   Plan: Continue Zosyn at current dose (Zosyn 3.375 g IV every 8 hours, 4 hour infusion).  Consider discontinuing all antibiotics.  Height: 5\' 10"  (177.8 cm) Weight: 95.3 kg (210 lb) IBW/kg (Calculated) : 73  Temp (24hrs), Avg:98.5 F (36.9 C), Min:98.2 F (36.8 C), Max:98.8 F (37.1 C)  Recent Labs  Lab 03/15/20 1334 03/16/20 0259 03/18/20 0700 03/19/20 0247 03/19/20 1048 03/19/20 1612 03/20/20 1216 03/21/20 0348 03/22/20 0343  WBC  --    < > 9.2 9.5  --   --  10.3 10.1 9.3  CREATININE  --    < > 0.85 0.79  --   --  0.87 0.92 0.89  LATICACIDVEN 2.2*  --   --   --  1.8 1.6  --   --   --    < > = values in this interval not displayed.    Estimated Creatinine Clearance: 76.7 mL/min (by C-G formula based on SCr of 0.89 mg/dL).    Allergies  Allergen Reactions  . Bee Venom Anaphylaxis         Antimicrobials this admission: Vanco 7/4>>7/6;  Restart 7/8>>7/10 Cefepime 7/4>>7/7 Zosyn 7/8>>   Dose adjustments this admission:   Microbiology results: 7/4 BCx: no growth to date x 4 days 7/4 UCx: neg 7/4 cdiff - neg   Rebbeca Paul, PharmD PGY1 Pharmacy Resident 03/22/2020 1:02 PM  Please check [email or web address] for unit-specific pharmacy phone numbers.

## 2020-03-22 NOTE — Progress Notes (Signed)
[name]  PROGRESS NOTE    CORDALE MANERA  UKG:[id number] DOB: 18-Sep-1940 DOA: 03/15/2020 PCP: [name], MD   Brief Narrative:   79 year old male with history of multiple myeloma on chemotherapy, CAD, A. fib, hypertension presented with generalized weakness and increasing leg pain on 03/15/2020. On presentation, he had a low-grade temperature his blood pressure was on the lower side but there was no tachycardia or hypoxia. Urinalysis was unremarkable; chest x-ray showed no infiltrate. He is slightly elevated lactic acid. He was started on broad-spectrum antibiotics and IV fluids  7/11: Lethargic this morning and remains on O2. Continue lasix. When able to get him awake, he does answer orientation questions correctly, but is still confused. Rpt CXR w/ worsening b/l upper lobe opacities. I've ordered sputum cx but I don't see that collected. Nursing notes that he's perked up later today, but this waxing/waning pattern is still problematic. This is Day 7 of total abx coverage. It does not seem that he's had much progress. Only source of infection ID'd is lung by imaging. Have asked pulm to weigh-in. Appreciate assistance.    Assessment & Plan:   Active Problems:   Multiple myeloma ([place])   Essential hypertension   Atrial tachycardia/fibrillation   PACEMAKER, 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 - tachypnea, hypotension,  elevated lactic acid at admission - UA was unremarkable. Chest x-ray showed no infiltrates. No cellulitic changes. CT of the abdomen and pelvis with contrast showed diverticulosis without diverticulitis. Bld Cx NTD. Procal elevated, but now down to 1.98 - Stool for C. difficile negative. COVID-19 testing negative. - was started on IV fluids, vancomycin and cefepime. Cultures negative so far.     - cefepime d/c'd d/t no source ID'd and mentation changes; CXR/CTA PE ordered as he has become hypoxic. Hold abx for now until source is ID'd.     - CXR negative, CTA PE negative for PE, but showed "Patchy ground-glass opacities in the anterior and posterior right upper lobe may be pulmonary edema or infectious, including atypical viral organisms." It also showed "Small to moderate bilateral pleural effusions with compressive atelectasis. Mild pulmonary edema." RVP was negative. Broad spec abx resumed. He will received lasix IV.     - WBC ok. MRSA swab negative. Vanc stopped. Azithro has completed.     - Acute illness is presenting him from being completely mentally clear this AM. He is A&O x 3, but still with confusion.     - 7/11: lethargic this AM, see above; this is day 7 of total abx coverage. CXR w/ "worsening" b/l upper lobe opacities. Is this edema or infection? One would think 7 days would be good on abx coverage. He is consider immune compromised d/t recent chemo; do we have some atypical microbial issue not covered by abx? Have asked Pulm to weigh-in.    Multiple myeloma, currently on chemotherapy - follows with Dr. Earlie Server - per onco: "smoldering  multiple myeloma that was recently started on systemic therapy with subcutaneous Velcade, Revlimid and Decadron because of the progressive nature of his disease. The patient has been tolerating this treatment well with no concerning adverse effects and he is status post 3 cycles." - per onco: "strongly recommend for the patient  to discontinue his systemic chemotherapy for the multiple myeloma at this point. I will arrange for him a follow-up visit with me in few weeks after his discharge for evaluation and repeat myeloma panel."  Thrombocytopenia - Probably from above. - no evidence of bleed, monitor - resolved  Normocytic anemia     - no evidence of bleed, Hgb stable  Hypokalemia Hypomagnesemia - resolved; decrease K+; follow  Generalized weakness - PT recommends SNF placement. Social worker consult  Hypertension - BP still soft, lasix reduced to home dose; follow; resume remainder home meds when tolerated  Paroxysmal A. fib - Paced rhythm. Continue Xarelto - echo:Left Ventricle: Left ventricular ejection fraction, by estimation, is 55 to 60%. The left ventricle has normal function. The left ventricle has no regional wall motion abnormalities. Definity contrast agent was given IV to delineate the left ventricular  endocardial borders. The left ventricular internal cavity size was normal in size. There is mild left ventricular hypertrophy. Left ventricular diastolic parameters are indeterminate.  Dyslipidemia - Continue statin  Metabolic acidosis - follow up lactic acid - bicarb approaching normal, decrease supplement  Conjunctivitis left eye      - cipro eye drops  DVT prophylaxis: xarelto Code Status: FULL Family Communication: Updated son by phone   Status is: Inpatient  Remains inpatient appropriate because:Inpatient level of care appropriate due to severity of illness   Dispo: The patient is from: Home              Anticipated d/c is to: SNF              Anticipated d/c date is: 3 days              Patient currently is not medically stable to d/c.  Consultants:   Oncology  PCCM  Antimicrobials:  . Acyclovir, zosyn   ROS:  Denies CP, N, V, ab pain, HA . Remainder 10-pt ROS is negative for all not previously  mentioned.  Subjective: "Huh? I need my truck."  Objective: Vitals:   03/22/20 0044 03/22/20 0521 03/22/20 0836 03/22/20 1228  BP: (!) 146/60 (!) 112/54 (!) 157/57 (!) 110/55  Pulse: 69 70 70 65  Resp: 17 19 19 15   Temp: 98.2 F (36.8 C) 98.8 F (37.1 C) 98.6 F (37 C) 98.2 F (36.8 C)  TempSrc: Axillary Axillary Oral Oral  SpO2: 90% 96% 92% 92%  Weight:  95.3 kg    Height:        Intake/Output Summary (Last 24 hours) at 03/22/2020 1530 Last data filed at 03/22/2020 0500 Gross per 24 hour  Intake --  Output 1500 ml  Net -1500 ml   Filed Weights   03/18/20 0338 03/21/20 0447 03/22/20 0521  Weight: 99.5 kg 101.6 kg 95.3 kg    Examination:  General: 79 y.o. male resting in bed in NAD Cardiovascular: RRR, +S1, S2, no m/g/r Respiratory: some soft exp wheeze, but improved and improved air movement overal, normal WOB on 2L  GI: BS+, NDNT, no masses noted MSK: No e/c/c Skin: No rashes, bruises, ulcerations noted Neuro: A&O x 3, hard of hearing, follow commands Psyc: calm/cooperative, but confused   Data Reviewed: I have personally reviewed  following labs and imaging studies.  CBC: Recent Labs  Lab 03/18/20 0700 03/19/20 0247 03/20/20 1216 03/21/20 0348 03/22/20 0343  WBC 9.2 9.5 10.3 10.1 9.3  NEUTROABS 4.9 5.2 5.1 5.5 5.2  HGB 10.3* 10.8* 10.6* 10.3* 11.1*  HCT 30.0* 31.1* 30.8* 30.1* 32.7*  MCV 96.5 96.0 96.9 95.9 96.5  PLT 136* 164 250 260 564   Basic Metabolic Panel: Recent Labs  Lab 03/18/20 0700 03/19/20 0247 03/20/20 1216 03/21/20 0348 03/22/20 0343  NA 139 138 140 141 143  K 3.2* 3.8 4.2 3.9 4.0  CL 114* 114* 112* 114* 110  CO2 14* 14* 17* 17* 21*  GLUCOSE 72 98 106* 109* 98  BUN 20 19 18 20 21   CREATININE 0.85 0.79 0.87 0.92 0.89  CALCIUM 8.3* 8.3* 8.5* 8.5* 8.6*  MG 1.7 1.6* 1.6* 1.5* 2.0   GFR: Estimated Creatinine Clearance: 76.7 mL/min (by C-G formula based on SCr of 0.89 mg/dL). Liver Function Tests: Recent Labs  Lab  03/17/20 0349 03/19/20 0247 03/20/20 1216 03/21/20 0348 03/22/20 0343  AST 67* 54* 63* 63* 54*  ALT 27 33 38 39 36  ALKPHOS 34* 34* 36* 42 43  BILITOT 1.7* 1.5* 1.6* 1.5* 1.0  PROT 4.9* 5.0* 5.5* 5.3* 5.9*  ALBUMIN 2.4* 2.3* 2.6* 2.4* 2.4*   No results for input(s): LIPASE, AMYLASE in the last 168 hours. Recent Labs  Lab 03/18/20 1648  AMMONIA 16   Coagulation Profile: No results for input(s): INR, PROTIME in the last 168 hours. Cardiac Enzymes: No results for input(s): CKTOTAL, CKMB, CKMBINDEX, TROPONINI in the last 168 hours. BNP (last 3 results) No results for input(s): PROBNP in the last 8760 hours. HbA1C: No results for input(s): HGBA1C in the last 72 hours. CBG: No results for input(s): GLUCAP in the last 168 hours. Lipid Profile: No results for input(s): CHOL, HDL, LDLCALC, TRIG, CHOLHDL, LDLDIRECT in the last 72 hours. Thyroid Function Tests: No results for input(s): TSH, T4TOTAL, FREET4, T3FREE, THYROIDAB in the last 72 hours. Anemia Panel: No results for input(s): VITAMINB12, FOLATE, FERRITIN, TIBC, IRON, RETICCTPCT in the last 72 hours. Sepsis Labs: Recent Labs  Lab 03/15/20 1711 03/16/20 0259 03/17/20 0349 03/18/20 0700 03/19/20 1048 03/19/20 1612  PROCALCITON 2.19 4.28 3.74 1.98  --   --   LATICACIDVEN  --   --   --   --  1.8 1.6    Recent Results (from the past 240 hour(s))  Culture, blood (routine x 2)     Status: None   Collection Time: 03/15/20 12:08 PM   Specimen: BLOOD LEFT ARM  Result Value Ref Range Status   Specimen Description BLOOD LEFT ARM  Final   Special Requests   Final    BOTTLES DRAWN AEROBIC AND ANAEROBIC Blood Culture adequate volume   Culture   Final    NO GROWTH 5 DAYS Performed at [facility], [address], [place], [place] [address]    Report Status 03/20/2020 FINAL  Final  Culture, blood (routine x 2)     Status: None   Collection Time: 03/15/20 12:09 PM   Specimen: BLOOD  Result Value Ref Range Status    Specimen Description BLOOD LEFT ANTECUBITAL  Final   Special Requests   Final    BOTTLES DRAWN AEROBIC AND ANAEROBIC Blood Culture adequate volume   Culture   Final    NO GROWTH 5 DAYS Performed at [facility], [place] [address] [place], [place] [address]    Report Status  03/20/2020 FINAL  Final  SARS Coronavirus 2 by RT PCR (hospital order, performed in [facility] hospital lab) Nasopharyngeal Nasopharyngeal Swab     Status: None   Collection Time: 03/15/20  2:39 PM   Specimen: Nasopharyngeal Swab  Result Value Ref Range Status   SARS Coronavirus 2 NEGATIVE NEGATIVE Final    Comment: (NOTE) SARS-CoV-2 target nucleic acids are NOT DETECTED.  The SARS-CoV-2 RNA is generally detectable in upper and lower respiratory specimens during the acute phase of infection. The lowest concentration of SARS-CoV-2 viral copies this assay can detect is 250 copies / mL. A negative result does not preclude SARS-CoV-2 infection and should not be used as the sole basis for treatment or other patient management decisions.  A negative result may occur with improper specimen collection / handling, submission of specimen other than nasopharyngeal swab, presence of viral mutation(s) within the areas targeted by this assay, and inadequate number of viral copies (<250 copies / mL). A negative result must be combined with clinical observations, patient history, and epidemiological information.  Fact Sheet for Patients:   StrictlyIdeas.no  Fact Sheet for Healthcare Providers: BankingDealers.co.za  This test is not yet approved or  cleared by the [place] FDA and has been authorized for detection and/or diagnosis of SARS-CoV-2 by FDA under an Emergency Use Authorization (EUA).  This EUA will remain in effect (meaning this test can be used) for the duration of the COVID-19 declaration under Section 564(b)(1) of the Act, 21 U.S.C. section 360bbb-3(b)(1),  unless the authorization is terminated or revoked sooner.  Performed at [facility], [place] [address], [place], [place] [address]   Urine culture     Status: None   Collection Time: 03/15/20  2:45 PM   Specimen: In/Out Cath Urine  Result Value Ref Range Status   Specimen Description IN/OUT CATH URINE  Final   Special Requests NONE  Final   Culture   Final    NO GROWTH Performed at [facility], [place] [address], [place], [place] [address]    Report Status 03/16/2020 FINAL  Final  C Difficile Quick Screen w PCR reflex     Status: None   Collection Time: 03/16/20  5:43 AM   Specimen: STOOL  Result Value Ref Range Status   C Diff antigen NEGATIVE NEGATIVE Final   C Diff toxin NEGATIVE NEGATIVE Final   C Diff interpretation No C. difficile detected.  Final    Comment: Performed at [facility], [place] [address], [place], [place] [address]  Respiratory Panel by PCR     Status: None   Collection Time: 03/19/20  7:22 PM   Specimen: Nasopharyngeal Swab; Respiratory  Result Value Ref Range Status   Adenovirus NOT DETECTED NOT DETECTED Final   Coronavirus 229E NOT DETECTED NOT DETECTED Final    Comment: (NOTE) The Coronavirus on the Respiratory Panel, DOES NOT test for the novel  Coronavirus (2019 nCoV)    Coronavirus HKU1 NOT DETECTED NOT DETECTED Final   Coronavirus NL63 NOT DETECTED NOT DETECTED Final   Coronavirus OC43 NOT DETECTED NOT DETECTED Final   Metapneumovirus NOT DETECTED NOT DETECTED Final   Rhinovirus / Enterovirus NOT DETECTED NOT DETECTED Final   Influenza A NOT DETECTED NOT DETECTED Final   Influenza B NOT DETECTED NOT DETECTED Final   Parainfluenza Virus 1 NOT DETECTED NOT DETECTED Final   Parainfluenza Virus 2 NOT DETECTED NOT DETECTED Final   Parainfluenza Virus 3 NOT DETECTED NOT DETECTED Final  Parainfluenza Virus 4 NOT DETECTED NOT DETECTED Final   Respiratory Syncytial Virus NOT DETECTED NOT DETECTED Final   Bordetella pertussis NOT DETECTED  NOT DETECTED Final   Chlamydophila pneumoniae NOT DETECTED NOT DETECTED Final   Mycoplasma pneumoniae NOT DETECTED NOT DETECTED Final    Comment: Performed at [facility], [place] [address], [place], [place] [address]  MRSA PCR Screening     Status: None   Collection Time: 03/19/20  7:22 PM   Specimen: Nasal Mucosa; Nasopharyngeal  Result Value Ref Range Status   MRSA by PCR NEGATIVE NEGATIVE Final    Comment:        The GeneXpert MRSA Assay (FDA approved for NASAL specimens only), is one component of a comprehensive MRSA colonization surveillance program. It is not intended to diagnose MRSA infection nor to guide or monitor treatment for MRSA infections. Performed at [facility], [place] [address] [place], [place] [address]       Radiology Studies: DG CHEST PORT 1 VIEW  Result Date: 03/22/2020 CLINICAL DATA:  hypoxia EXAM: PORTABLE CHEST - 1 VIEW COMPARISON:  03/19/2020 FINDINGS: Improved aeration at the left lung base but some increase in interstitial opacities in the right upper lung. Stable left subclavian dual lead transvenous pacemaker. Heart size upper limits normal for technique. No effusion.  No pneumothorax. Visualized bones unremarkable. IMPRESSION: Worsening right upper lobe interstitial opacities. Electronically Signed   By: Lucrezia Europe M.D.   On: 03/22/2020 13:48     Scheduled Meds: . acyclovir  200 mg Oral BID  . atorvastatin  10 mg Oral q1800  . ciprofloxacin  1 drop Both Eyes Q4H while awake  . finasteride  5 mg Oral Daily  . furosemide  40 mg Oral Daily  . Gerhardt's butt cream   Topical QID  . melatonin  3 mg Oral QHS  . multivitamin with minerals  1 tablet Oral Daily  . mupirocin ointment  1 application Topical Daily  . omega-3 acid ethyl esters  1 g Oral Daily  . potassium chloride  40 mEq Oral BID  . rivaroxaban  20 mg Oral Q supper  . sodium bicarbonate  1,300 mg Oral TID  . sodium chloride flush  3 mL Intravenous Once  . thiamine  100 mg  Oral Daily   Continuous Infusions: . piperacillin-tazobactam (ZOSYN)  IV 3.375 g (03/22/20 1429)     LOS: 7 days    Time spent: 25 minutes spent in the coordination of care today.    Jonnie Finner, DO [facility]  If 7PM-7AM, please contact night-coverage [email or web address] 03/22/2020, 3:30 PM

## 2020-03-22 NOTE — Consult Note (Signed)
NAME:  [name], MRN:  [id number], DOB:  1941-03-02, LOS: 7 ADMISSION DATE:  03/15/2020, CONSULTATION DATE:  7/11 REFERRING MD:  Marylyn Ishihara, CHIEF COMPLAINT:  Abnormal cxr   Brief History   79 year old male w/ multiple myeloma (on chemo->last rx 6/22 see below). Admitted initially w/ working dx of sepsis source not clear. PCCM asked to admit as in spite of 8 days abx and gentle diuresis still having hypoxia and abnormal CXR   History of present illness   79 year old male patient with history as mentioned below presented on 7/4 with chief complaint of increasing leg pain low-grade fever and weakness.  In the ER was found to be hypotensive, chest x-ray initially without significant infiltrates, was admitted with diagnosis of sepsis of unclear etiology  and placed on empiric antibiotics in addition to IV fluids cultures were obtained.  Initial procalcitonin slightly elevated at 2.19.  Was treated with IV empiric antibiotics, improved clinically however continued to be hypoxic a CT of chest was obtained which demonstrated bilateral effusions scattered areas of fibrotic infiltrates on the background of chronic emphysema changes.  IV antibiotics were continued, he was challenged with IV Lasix. Further evaluation of lab work demonstrates ongoing elevated eosinophils, respiratory viral panel sent this was negative, today he is essentially euvolemic according to his intake output balance a follow-up chest x-ray was obtained on 7/1 as patient was still oxygen dependent demonstrating increasing upper lobe opacities and because of this pulmonary asked to evaluate  Past Medical History  Multiple myeloma on chemotherapy n status post 3 cycles of Velcade, Revlimid, and Decadron, last treatment 6/29, coronary artery disease, atrial fibrillation, hypertension, internal pacemaker, chronic anticoagulation , anemia of chronic disease.  Significant Hospital Events   7/4 admitted  7/11 pccm consulted for pulm infiltrates  and new oxygen need in spite of abx and diuretics.   Consults:  PCCM   Procedures:    Significant Diagnostic Tests:  CT chest 7/8: 1. No pulmonary embolus allowing for motion artifact.2. Multi chamber cardiomegaly with small pericardial effusion.Contrast refluxing into the hepatic veins and IVC consistent with elevated right heart pressures.3. Small to moderate bilateral pleural effusions with compressive atelectasis. Mild pulmonary edema.4. Patchy ground-glass opacities in the anterior and posterior right upper lobe may be pulmonary edema or infectious, including atypical viral organisms.5. Shotty mediastinal and hilar nodes are likely reactive.  Micro Data:  UC 7/5: neg RVP neg BC 7/9: neg  Antimicrobials:  Cefepime 7/4>>>7/7 Zosyn 7/8>>> vanc (stopped)  Interim history/subjective:  Weak, c/o back pain.   Objective   Blood pressure (Abnormal) 110/55, pulse 65, temperature 98.2 F (36.8 C), temperature source Oral, resp. rate 15, height 5' 10"  (1.778 m), weight 95.3 kg, SpO2 92 %.        Intake/Output Summary (Last 24 hours) at 03/22/2020 1524 Last data filed at 03/22/2020 0500 Gross per 24 hour  Intake no documentation  Output 1500 ml  Net -1500 ml   Filed Weights   03/18/20 0338 03/21/20 0447 03/22/20 0521  Weight: 99.5 kg 101.6 kg 95.3 kg    Examination: General: Acute on chronically ill 79 year old white male lying in bed hard of hearing, slow to respond, diffusely weak HENT: Mucous membranes are dry, neck veins flat Lungs: Diminished throughout no accessory use currently 2 L nasal cannula Cardiovascular: Regular rate and rhythm Abdomen: Soft nontender Extremities: Trace lower extremities edema pulses palpable cool to touch Neuro: Awake and oriented but slow to respond GU: Clear yellow  Resolved Hospital Problem  list     Assessment & Plan:  Acute hypoxic respiratory failure  Pulmonary infiltrates of unclear etiology  Multiple myeloma History of atrial  fibrillation on anticoagulation History coronary artery disease Sepsis in the setting of immunocompromising drugs etiology unclear History of hypertension Generalized weakness Thrombocytopenia    acute hypoxic respiratory failure in setting of pulmonary infiltrates of unclear etiology -Differential diagnosis includes infectious, seems unlikely given prolonged antibiotic course, although aspiration certainly also a consideration, pulmonary edema again seems unlikely given clinically looks almost volume depleted, inflammatory, such as drug-induced pneumonitis from underlying chemotherapy and furthermore consider progression of underlying malignancy -He is very weak and debilitated Plan Continue current antibiotics for now, although not sure of their utility Hold further Lasix, he appears volume depleted Speech therapy evaluation for swallowing We will trial systemic steroids He is a poor candidate for bronchoscopy given debilitated state, also on long-term anticoagulation  If he does not respond to systemic steroids there may be little to offer  Erick Colace ACNP-BC [place] Pager # [phone] OR # [phone] if no answer   Best practice:  Per primary  Labs   CBC: Recent Labs  Lab 03/18/20 0700 03/19/20 0247 03/20/20 1216 03/21/20 0348 03/22/20 0343  WBC 9.2 9.5 10.3 10.1 9.3  NEUTROABS 4.9 5.2 5.1 5.5 5.2  HGB 10.3* 10.8* 10.6* 10.3* 11.1*  HCT 30.0* 31.1* 30.8* 30.1* 32.7*  MCV 96.5 96.0 96.9 95.9 96.5  PLT 136* 164 250 260 353    Basic Metabolic Panel: Recent Labs  Lab 03/18/20 0700 03/19/20 0247 03/20/20 1216 03/21/20 0348 03/22/20 0343  NA 139 138 140 141 143  K 3.2* 3.8 4.2 3.9 4.0  CL 114* 114* 112* 114* 110  CO2 14* 14* 17* 17* 21*  GLUCOSE 72 98 106* 109* 98  BUN 20 19 18 20 21   CREATININE 0.85 0.79 0.87 0.92 0.89  CALCIUM 8.3* 8.3* 8.5* 8.5* 8.6*  MG 1.7 1.6* 1.6* 1.5* 2.0   GFR: Estimated Creatinine Clearance: 76.7 mL/min  (by C-G formula based on SCr of 0.89 mg/dL). Recent Labs  Lab 03/15/20 1711 03/16/20 0259 03/16/20 0259 03/17/20 0349 03/17/20 0349 03/18/20 0700 03/18/20 0700 03/19/20 0247 03/19/20 1048 03/19/20 1612 03/20/20 1216 03/21/20 0348 03/22/20 0343  PROCALCITON 2.19 4.28  --  3.74  --  1.98  --   --   --   --   --   --   --   WBC  --  9.6   < > 9.2   < > 9.2   < > 9.5  --   --  10.3 10.1 9.3  LATICACIDVEN  --   --   --   --   --   --   --   --  1.8 1.6  --   --   --    < > = values in this interval not displayed.    Liver Function Tests: Recent Labs  Lab 03/17/20 0349 03/19/20 0247 03/20/20 1216 03/21/20 0348 03/22/20 0343  AST 67* 54* 63* 63* 54*  ALT 27 33 38 39 36  ALKPHOS 34* 34* 36* 42 43  BILITOT 1.7* 1.5* 1.6* 1.5* 1.0  PROT 4.9* 5.0* 5.5* 5.3* 5.9*  ALBUMIN 2.4* 2.3* 2.6* 2.4* 2.4*   No results for input(s): LIPASE, AMYLASE in the last 168 hours. Recent Labs  Lab 03/18/20 1648  AMMONIA 16    ABG    Component Value Date/Time   PHART 7.465 (H) 03/19/2020 1125   PCO2ART  21.3 (L) 03/19/2020 1125   PO2ART 68.6 (L) 03/19/2020 1125   HCO3 15.1 (L) 03/19/2020 1125   TCO2 23 04/28/2013 1911   ACIDBASEDEF 8.0 (H) 03/19/2020 1125   O2SAT 94.2 03/19/2020 1125     Coagulation Profile: No results for input(s): INR, PROTIME in the last 168 hours.  Cardiac Enzymes: No results for input(s): CKTOTAL, CKMB, CKMBINDEX, TROPONINI in the last 168 hours.  HbA1C: No results found for: HGBA1C  CBG: No results for input(s): GLUCAP in the last 168 hours.  Review of Systems:   Review of Systems  Constitutional: Positive for malaise/fatigue and weight loss. Negative for chills and fever.  HENT: Positive for hearing loss.   Eyes: Negative.   Respiratory: Positive for shortness of breath.   Cardiovascular: Positive for leg swelling. Negative for chest pain.  Gastrointestinal: Negative.   Genitourinary: Negative.   Musculoskeletal: Negative.   Skin: Negative.    Neurological: Positive for weakness.  Endo/Heme/Allergies: Negative.   Psychiatric/Behavioral: Negative.      Past Medical History  He,  has a past medical history of Atrial flutter ([place]), CAD (coronary artery disease), Complete heart block ([place]), Cubital tunnel syndrome, Degenerative disc disease, Heart block, HOH (hard of hearing), adenomatous colonic polyps, Hyperlipidemia, Hypertension, Multiple myeloma, Persistent atrial fibrillation ([place]), Sleep apnea, and Venous insufficiency.   Surgical History    Past Surgical History:  Procedure Laterality Date  . CARDIOVERSION N/A 04/20/2018   Procedure: CARDIOVERSION;  Surgeon: Skeet Latch, MD;  Location: [facility];  Service: Cardiovascular;  Laterality: N/A;  . COLONOSCOPY  10-07-2009   TA polyp, tics, hems   . CORONARY ANGIOPLASTY WITH STENT PLACEMENT  2005  . PACEMAKER PLACEMENT  2011  . POLYPECTOMY    . PPM GENERATOR CHANGEOUT N/A 09/27/2019   Procedure: PPM GENERATOR CHANGEOUT;  Surgeon: Deboraha Sprang, MD;  Location: [facility];  Service: Cardiovascular;  Laterality: N/A;  . Right olecranon nerve surgery    . VENTRAL HERNIA REPAIR       Social History   reports that he quit smoking about 15 years ago. His smoking use included cigarettes. He has a 49.00 pack-year smoking history. He has quit using smokeless tobacco. He reports current alcohol use of about 28.0 standard drinks of alcohol per week. He reports that he does not use drugs.   Family History   His family history includes Cancer in his father; Heart disease in his brother and father. There is no history of Colon cancer.   Allergies Allergies  Allergen Reactions  . Bee Venom Anaphylaxis          Home Medications  Prior to Admission medications   Medication Sig Start Date End Date Taking? Authorizing Provider  acyclovir (ZOVIRAX) 200 MG capsule Take 1 capsule (200 mg total) by mouth 2 (two) times daily. 01/02/20  Yes Curt Bears, MD  atorvastatin  (LIPITOR) 10 MG tablet Take 10 mg by mouth daily at 6 PM.    Yes [provider]  dexamethasone (DECADRON) 4 MG tablet 10 tablet p.o. daily weekly start with the first day of the chemotherapy. Patient taking differently: Take 40 mg by mouth once a week. start with the first day of the chemotherapy. 01/02/20  Yes Curt Bears, MD  finasteride (PROSCAR) 5 MG tablet Take 5 mg by mouth daily.   Yes [provider]  furosemide (LASIX) 40 MG tablet Take 1 tablet by mouth once daily Patient taking differently: Take 40 mg by mouth daily.  08/31/19  Yes  Baldwin Jamaica, PA-C  ketotifen (ALAWAY) 0.025 % ophthalmic solution Place 1 drop into both eyes 2 (two) times daily as needed (for dry eyes).    Yes [provider]  lenalidomide (REVLIMID) 25 MG capsule 03/12/20-auth -[id number] adult male Take 1 capsule by mouth daily on days 1-14 then 7 days off. Repeat every 21 days. Patient taking differently: Take 25 mg by mouth See admin instructions. 03/12/20-auth -[id number] adult male Take 1 capsule by mouth daily on days 1-14 then 7 days off. Repeat every 21 days. 03/12/20  Yes Curt Bears, MD  losartan (COZAAR) 100 MG tablet Take 100 mg by mouth daily.   Yes [provider]  Magnesium 200 MG TABS Take 1 tablet (200 mg total) by mouth daily. 05/08/18  Yes Sherran Needs, NP  Multiple Vitamin (MULTIVITAMIN) capsule Take 1 capsule by mouth daily.     Yes [provider]  mupirocin ointment (BACTROBAN) 2 % Apply 1 application topically daily. Apply with dressing change 02/15/20  Yes [provider]  Omega-3 Fatty Acids (FISH OIL) 1200 MG CAPS Take 1,200 mg by mouth daily.    Yes [provider]  Potassium Chloride ER 20 MEQ TBCR Take 1 tablet by mouth once daily Patient taking differently: Take 20 mEq by mouth daily.  07/04/19  Yes Baldwin Jamaica, PA-C  thiamine 100 MG tablet Take 100 mg by mouth daily.   Yes [provider]  XARELTO 20 MG  TABS tablet TAKE 1 TABLET BY MOUTH ONCE DAILY WITH SUPPER Patient taking differently: Take 20 mg by mouth daily with supper.  02/03/20  Yes Deboraha Sprang, MD  doxycycline (VIBRAMYCIN) 100 MG capsule Take 100 mg by mouth 2 (two) times daily. Patient not taking: Reported on 03/16/2020 02/15/20 03/26/20  [provider]  EPINEPHrine 0.3 mg/0.3 mL IJ SOAJ injection Inject 0.3 mg into the muscle once as needed for anaphylaxis. 04/25/17   [provider]  nitroGLYCERIN (NITROSTAT) 0.4 MG SL tablet PLACE ONE TABLET UNDER THE TONGUE EVERY 5 MINUTES AS NEEDED FOR CHEST PAIN Patient taking differently: Place 0.4 mg under the tongue every 5 (five) minutes as needed for chest pain.  07/19/18   Josue Hector, MD  prochlorperazine (COMPAZINE) 10 MG tablet Take 1 tablet (10 mg total) by mouth every 6 (six) hours as needed for nausea or vomiting. 01/02/20   Curt Bears, MD  Vitamins A & D (VITAMIN A & D) ointment Apply 1 application topically as needed for dry skin. Patient not taking: Reported on 03/16/2020    [provider]     Critical care time: NA    Erick Colace ACNP-BC [place] Pager # [phone] OR # [phone] if no answer

## 2020-03-23 DIAGNOSIS — Z515 Encounter for palliative care: Secondary | ICD-10-CM

## 2020-03-23 DIAGNOSIS — Z7189 Other specified counseling: Secondary | ICD-10-CM

## 2020-03-23 DIAGNOSIS — R531 Weakness: Secondary | ICD-10-CM

## 2020-03-23 LAB — BASIC METABOLIC PANEL
Anion gap: 13 (ref 5–15)
BUN: 26 mg/dL — ABNORMAL HIGH (ref 8–23)
CO2: 22 mmol/L (ref 22–32)
Calcium: 8.8 mg/dL — ABNORMAL LOW (ref 8.9–10.3)
Chloride: 106 mmol/L (ref 98–111)
Creatinine, Ser: 0.91 mg/dL (ref 0.61–1.24)
GFR calc Af Amer: >60 mL/min (ref >60)
GFR calc non Af Amer: >60 mL/min (ref >60)
Glucose, Bld: 159 mg/dL — ABNORMAL HIGH (ref 70–99)
Potassium: 4.2 mmol/L (ref 3.5–5.1)
Sodium: 141 mmol/L (ref 135–145)

## 2020-03-23 LAB — CBC WITH DIFFERENTIAL/PLATELET
Abs Immature Granulocytes: 0.16 10*3/uL — ABNORMAL HIGH (ref 0.00–0.07)
Basophils Absolute: 0.1 10*3/uL (ref 0.0–0.1)
Basophils Relative: 1 %
Eosinophils Absolute: 0 10*3/uL (ref 0.0–0.5)
Eosinophils Relative: 0 %
HCT: 33.9 % — ABNORMAL LOW (ref 39.0–52.0)
Hemoglobin: 11.6 g/dL — ABNORMAL LOW (ref 13.0–17.0)
Immature Granulocytes: 2 %
Lymphocytes Relative: 12 %
Lymphs Abs: 1.2 10*3/uL (ref 0.7–4.0)
MCH: 32.9 pg (ref 26.0–34.0)
MCHC: 34.2 g/dL (ref 30.0–36.0)
MCV: 96 fL (ref 80.0–100.0)
Monocytes Absolute: 0.1 10*3/uL (ref 0.1–1.0)
Monocytes Relative: 1 %
Neutro Abs: 8.9 10*3/uL — ABNORMAL HIGH (ref 1.7–7.7)
Neutrophils Relative %: 84 %
Platelets: 438 10*3/uL — ABNORMAL HIGH (ref 150–400)
RBC: 3.53 MIL/uL — ABNORMAL LOW (ref 4.22–5.81)
RDW: 13.3 % (ref 11.5–15.5)
WBC: 10.5 10*3/uL (ref 4.0–10.5)
nRBC: 0 % (ref 0.0–0.2)

## 2020-03-23 LAB — ANA W/REFLEX IF POSITIVE: Anti Nuclear Antibody (ANA): NEGATIVE

## 2020-03-23 LAB — MAGNESIUM: Magnesium: 2 mg/dL (ref 1.7–2.4)

## 2020-03-23 NOTE — Evaluation (Signed)
Clinical/Bedside Swallow Evaluation Patient Details  Name: [name] MRN: [id number] Date of Birth: 11/05/1940  Today's Date: 03/23/2020 Time: SLP Start Time (ACUTE ONLY): 59 SLP Stop Time (ACUTE ONLY): 1140 SLP Time Calculation (min) (ACUTE ONLY): 10 min  Past Medical History:  Past Medical History:  Diagnosis Date  . Atrial flutter ([place])    A. s/p prior ablation;  B.  s/p CTI ablation 10/24/10 (Dr. Caryl Comes)  . CAD (coronary artery disease)    A.  s/p CFX in the past;   B.  cath 06/2010: LAD 30-40%, D1 50%, OM1 50%, AVCFX stent 30%, dCFX 70-80% (med Rx), mRCA 40%;      C.  Echo 06/2010: EF 60-65%, mod LVH; mild LAE     . Complete heart block ([facility])    a. s/p STJ pacemaker  . Cubital tunnel syndrome   . Degenerative disc disease   . Heart block   . HOH (hard of hearing)   . Hx of adenomatous colonic polyps   . Hyperlipidemia   . Hypertension   . Multiple myeloma   . Persistent atrial fibrillation ([place])   . Sleep apnea   . Venous insufficiency    Past Surgical History:  Past Surgical History:  Procedure Laterality Date  . CARDIOVERSION N/A 04/20/2018   Procedure: CARDIOVERSION;  Surgeon: Skeet Latch, MD;  Location: [facility];  Service: Cardiovascular;  Laterality: N/A;  . COLONOSCOPY  10-07-2009   TA polyp, tics, hems   . CORONARY ANGIOPLASTY WITH STENT PLACEMENT  2005  . PACEMAKER PLACEMENT  2011  . POLYPECTOMY    . PPM GENERATOR CHANGEOUT N/A 09/27/2019   Procedure: PPM GENERATOR CHANGEOUT;  Surgeon: Deboraha Sprang, MD;  Location: [facility];  Service: Cardiovascular;  Laterality: N/A;  . Right olecranon nerve surgery    . VENTRAL HERNIA REPAIR     HPI:  79 year old man with complex hx including afib, CAD, CHB s/p PCM, ?afib on AC, Multilpe myeloma on velcade, revlimid, decodron presenting with FTT and worsening dyspnea.  CXR with infiltrates so tx with CAP coverage followed by HCAP coverage, no improvement.  CTA chest showing bilateral small effusions  and scattered NSIP-type infitlrates in setting f background emphysema. Per son at bedside patient has been declining pretty rapidly in past 3 weeks.  Patient denies cough, there may be some aspiration type symptoms.   Assessment / Plan / Recommendation Clinical Impression  Pt demonstrates concern for pharyngeal dysphagia; Pt has to drink very slowly and carefully to avoid coughing with liquids. Reports several choking episodes each day. One hard cough seen with trials today. Recommend MBS tomorrow for instrumental assessment given pulmonology's concern for aspiration. MBS tomorrow am.  SLP Visit Diagnosis: Dysphagia, oropharyngeal phase (R13.12)    Aspiration Risk  Moderate aspiration risk    Diet Recommendation Regular;Thin liquid   Liquid Administration via: Straw;Cup Medication Administration: Whole meds with puree Supervision: Patient able to self feed Compensations: Slow rate;Small sips/bites Postural Changes: Seated upright at 90 degrees    Other  Recommendations Oral Care Recommendations: Oral care BID   Follow up Recommendations        Frequency and Duration            Prognosis        Swallow Study   General HPI: 79 year old man with complex hx including afib, CAD, CHB s/p PCM, ?afib on AC, Multilpe myeloma on velcade, revlimid, decodron presenting with FTT and worsening dyspnea.  CXR with infiltrates  so tx with CAP coverage followed by HCAP coverage, no improvement.  CTA chest showing bilateral small effusions and scattered NSIP-type infitlrates in setting f background emphysema. Per son at bedside patient has been declining pretty rapidly in past 3 weeks.  Patient denies cough, there may be some aspiration type symptoms. Type of Study: Bedside Swallow Evaluation Previous Swallow Assessment: none Diet Prior to this Study: Regular;Thin liquids Temperature Spikes Noted: No Respiratory Status: Nasal cannula History of Recent Intubation: No Behavior/Cognition:  Alert;Cooperative;Pleasant mood Oral Cavity Assessment: Within Functional Limits Oral Care Completed by SLP: No Oral Cavity - Dentition: Edentulous Vision: Functional for self-feeding Self-Feeding Abilities: Needs assist Patient Positioning: Upright in chair Baseline Vocal Quality: Normal Volitional Cough: Strong Volitional Swallow: Able to elicit    Oral/Motor/Sensory Function Overall Oral Motor/Sensory Function: Within functional limits   Ice Chips     Thin Liquid Thin Liquid: Impaired Presentation: Cup;Straw Pharyngeal  Phase Impairments: Cough - Immediate    Nectar Thick Nectar Thick Liquid: Not tested   Honey Thick Honey Thick Liquid: Not tested   Puree Puree: Not tested   Solid     Solid: Not tested     Herbie Baltimore, MA CCC-SLP  Acute Rehabilitation Services Pager [phone] Office [phone]  Lynann Beaver 03/23/2020,2:29 PM

## 2020-03-23 NOTE — Progress Notes (Signed)
PROGRESS NOTE    [name]  CBJ:[id number] DOB: September 01, 1941 DOA: 03/15/2020 PCP: [name], MD   Brief Narrative:   79 year old male with history of multiple myeloma on chemotherapy, CAD, A. fib, hypertension presented with generalized weakness and increasing leg pain on 03/15/2020. On presentation, he had a low-grade temperature his blood pressure was on the lower side but there was no tachycardia or hypoxia. Urinalysis was unremarkable; chest x-ray showed no infiltrate. He is slightly elevated lactic acid. He was started on broad-spectrum antibiotics and IV fluids  7/12: His mental status is confusing. He waxes and wanes. He was A&O x 2 today and somewhat confused (again talking about getting to [facility]). Needs his hearing aids. Full assessment can not be made without it. He continues to require O2 support. Palliative Care consulted. Appreciate assistance. Pulmonology consulted. Appreciate assistance. Continuing steroids. Ultimately in this state, I am not sure he can take care of himself. It is not out of the realm to discharge him on O2, but without a root cause for his sepsis, waxing/waning mentation or new respiratory failure, one can make more and more an argument against any future aggressive treatment (ie MM tx). Family is hesitant to fill the role of decision makers. Palliative care working on this.    Assessment & Plan:   Active Problems:   Multiple myeloma ([place])   Essential hypertension   Atrial tachycardia/fibrillation   PACEMAKER, [employer] dual-chamber   Coronary artery disease-nonobstructive catheter in October 2011   Sepsis ([place])   Pressure injury of skin   Immunosuppressed due to chemotherapy   Acquired thrombophilia ([place])   Generalized weakness   Frequent falls   Long term (current) use of anticoagulants   Lactic acidosis   Thrombocytopenia ([facility])   Hypoalbuminemia   Hyperbilirubinemia   Demand ischemia ([facility])   Elevated brain natriuretic peptide (BNP)  level   Elevated procalcitonin   Hypokalemia   Anemia of chronic disease   Scalp hematoma   Obesity (BMI 30.0-34.9)  Sepsis: Present on admission - Unclear source. - tachypnea, hypotension, elevated lactic acid at admission - UA was unremarkable. Chest x-ray showed no infiltrates. No cellulitic changes. CT of the abdomen and pelvis with contrast showed diverticulosis without diverticulitis. Bld Cx NTD. Procal elevated, but now down to 1.98 - Stool for C. difficile negative. COVID-19 testing negative. - was started on IV fluids, vancomycin and cefepime. Cultures negative so far. - cefepime d/c'd d/t no source ID'd and mentation changes; CXR/CTA PE ordered as he has become hypoxic. Hold abx for now until source is ID'd. - CXR negative, CTA PE negative for PE, but showed "Patchy ground-glass opacities in the anterior and posterior right upper lobe may be pulmonary edema or infectious, including atypical viral organisms." It also showed "Small to moderate bilateral pleural effusions with compressive atelectasis. Mild pulmonary edema." RVP was negative. Broad spec abx resumed. He will received lasix IV.     - WBC ok. MRSA swab negative. Vanc stopped. Azithro has completed.     - Acute illness is presenting him from being completely mentally clear this AM. He is A&O x 3, but still with confusion.     - 7/12: Appreciate pulm assistance. Started on steroids, lasix held. I am in agreement that we've likely outrun the usefulness of abx in this case; let's d/c.   Multiple myeloma, currently on chemotherapy - follows with Dr. Earlie Server - per onco: "smoldering multiple myeloma that was recently started on systemic therapy with subcutaneous Velcade, Revlimid  and Decadron because of the progressive nature of his disease. The patient has been tolerating this treatment well with no concerning adverse effects and he is status post 3 cycles." - per onco: "strongly  recommend for the patient to discontinue his systemic chemotherapy for the multiple myeloma at this point. I will arrange for him a follow-up visit with me in few weeks after his discharge for evaluation and repeat myeloma panel."  Thrombocytopenia - Probably from above. - no evidence of bleed, monitor - resolved  Normocytic anemia     - no evidence of bleed, Hgb stable  Hypokalemia Hypomagnesemia - Resolved  Generalized weakness - PT recommends SNF placement. Social worker consult  Hypertension - BP still soft, lasix reduced to home dose; follow; resume remainder home meds when tolerated  Paroxysmal A. fib - Paced rhythm. Continue Xarelto - echo:Left Ventricle: Left ventricular ejection fraction, by estimation, is 55 to 60%. The left ventricle has normal function. The left ventricle has no regional wall motion abnormalities. Definity contrast agent was given IV to delineate the left ventricular  endocardial borders. The left ventricular internal cavity size was normal in size. There is mild left ventricular hypertrophy. Left ventricular diastolic parameters are indeterminate.  Dyslipidemia - Continue statin  Metabolic acidosis - follow up lactic acid - bicarb is normal, d/c bicarb supplement in AM  Conjunctivitis left eye      - cipro eye drops  DVT prophylaxis: xarelto Code Status: FULL Family Communication: Attempted call to son but only received VM   Status is: Inpatient  Remains inpatient appropriate because:Inpatient level of care appropriate due to severity of illness   Dispo: The patient is from: Home              Anticipated d/c is to: SNF              Anticipated d/c date is: > 3 days              Patient currently is not medically stable to d/c.  Consultants:   PCCM  Oncology  Antimicrobials:   Acyclovir, zosyn   ROS:  Denies CP, N, V, dyspnea . Remainder 10-pt ROS is negative for all not  previously mentioned.  Subjective: "You can talk to my daughter. She can do it."  Objective: Vitals:   03/22/20 1557 03/22/20 2000 03/22/20 2346 03/23/20 0344  BP: 105/65 (!) 114/56 123/67 124/70  Pulse: 71 70 70 70  Resp: (!) 25 20 (!) 23 17  Temp: 98.1 F (36.7 C) 97.8 F (36.6 C) 98.1 F (36.7 C) 98.5 F (36.9 C)  TempSrc: Oral Axillary Axillary Oral  SpO2:  95% 96% 94%  Weight:    95.3 kg  Height:        Intake/Output Summary (Last 24 hours) at 03/23/2020 0740 Last data filed at 03/23/2020 0434 Gross per 24 hour  Intake --  Output 1000 ml  Net -1000 ml   Filed Weights   03/21/20 0447 03/22/20 0521 03/23/20 0344  Weight: 101.6 kg 95.3 kg 95.3 kg    Examination:  General: 79 y.o. male resting in bed in NAD Cardiovascular: RRR, +S1, S2, no m/g/r, equal pulses throughout Respiratory: air movement is improving, no wheeze, on 5L [place] during interview GI: BS+, NDNT, no masses noted, no organomegaly noted MSK: No e/c/c Neuro: A&O x 2, general global weakness Psyc: calm/cooperative   Data Reviewed: I have personally reviewed following labs and imaging studies.  CBC: Recent Labs  Lab 03/18/20 0700 03/19/20 0247  03/20/20 1216 03/21/20 0348 03/22/20 0343  WBC 9.2 9.5 10.3 10.1 9.3  NEUTROABS 4.9 5.2 5.1 5.5 5.2  HGB 10.3* 10.8* 10.6* 10.3* 11.1*  HCT 30.0* 31.1* 30.8* 30.1* 32.7*  MCV 96.5 96.0 96.9 95.9 96.5  PLT 136* 164 250 260 960   Basic Metabolic Panel: Recent Labs  Lab 03/18/20 0700 03/19/20 0247 03/20/20 1216 03/21/20 0348 03/22/20 0343  NA 139 138 140 141 143  K 3.2* 3.8 4.2 3.9 4.0  CL 114* 114* 112* 114* 110  CO2 14* 14* 17* 17* 21*  GLUCOSE 72 98 106* 109* 98  BUN 20 19 18 20 21   CREATININE 0.85 0.79 0.87 0.92 0.89  CALCIUM 8.3* 8.3* 8.5* 8.5* 8.6*  MG 1.7 1.6* 1.6* 1.5* 2.0   GFR: Estimated Creatinine Clearance: 76.7 mL/min (by C-G formula based on SCr of 0.89 mg/dL). Liver Function Tests: Recent Labs  Lab 03/17/20 0349  03/19/20 0247 03/20/20 1216 03/21/20 0348 03/22/20 0343  AST 67* 54* 63* 63* 54*  ALT 27 33 38 39 36  ALKPHOS 34* 34* 36* 42 43  BILITOT 1.7* 1.5* 1.6* 1.5* 1.0  PROT 4.9* 5.0* 5.5* 5.3* 5.9*  ALBUMIN 2.4* 2.3* 2.6* 2.4* 2.4*   No results for input(s): LIPASE, AMYLASE in the last 168 hours. Recent Labs  Lab 03/18/20 1648  AMMONIA 16   Coagulation Profile: No results for input(s): INR, PROTIME in the last 168 hours. Cardiac Enzymes: No results for input(s): CKTOTAL, CKMB, CKMBINDEX, TROPONINI in the last 168 hours. BNP (last 3 results) No results for input(s): PROBNP in the last 8760 hours. HbA1C: No results for input(s): HGBA1C in the last 72 hours. CBG: No results for input(s): GLUCAP in the last 168 hours. Lipid Profile: No results for input(s): CHOL, HDL, LDLCALC, TRIG, CHOLHDL, LDLDIRECT in the last 72 hours. Thyroid Function Tests: No results for input(s): TSH, T4TOTAL, FREET4, T3FREE, THYROIDAB in the last 72 hours. Anemia Panel: No results for input(s): VITAMINB12, FOLATE, FERRITIN, TIBC, IRON, RETICCTPCT in the last 72 hours. Sepsis Labs: Recent Labs  Lab 03/17/20 0349 03/18/20 0700 03/19/20 1048 03/19/20 1612 03/22/20 1600  PROCALCITON 3.74 1.98  --   --  0.27  LATICACIDVEN  --   --  1.8 1.6  --     Recent Results (from the past 240 hour(s))  Culture, blood (routine x 2)     Status: None   Collection Time: 03/15/20 12:08 PM   Specimen: BLOOD LEFT ARM  Result Value Ref Range Status   Specimen Description BLOOD LEFT ARM  Final   Special Requests   Final    BOTTLES DRAWN AEROBIC AND ANAEROBIC Blood Culture adequate volume   Culture   Final    NO GROWTH 5 DAYS Performed at [facility], [address], [place], [place] [address]    Report Status 03/20/2020 FINAL  Final  Culture, blood (routine x 2)     Status: None   Collection Time: 03/15/20 12:09 PM   Specimen: BLOOD  Result Value Ref Range Status   Specimen Description BLOOD LEFT  ANTECUBITAL  Final   Special Requests   Final    BOTTLES DRAWN AEROBIC AND ANAEROBIC Blood Culture adequate volume   Culture   Final    NO GROWTH 5 DAYS Performed at [facility], [place] [address], [place], [place] [address]    Report Status 03/20/2020 FINAL  Final  SARS Coronavirus 2 by RT PCR (hospital order, performed in [facility] hospital lab) Nasopharyngeal Nasopharyngeal  Swab     Status: None   Collection Time: 03/15/20  2:39 PM   Specimen: Nasopharyngeal Swab  Result Value Ref Range Status   SARS Coronavirus 2 NEGATIVE NEGATIVE Final    Comment: (NOTE) SARS-CoV-2 target nucleic acids are NOT DETECTED.  The SARS-CoV-2 RNA is generally detectable in upper and lower respiratory specimens during the acute phase of infection. The lowest concentration of SARS-CoV-2 viral copies this assay can detect is 250 copies / mL. A negative result does not preclude SARS-CoV-2 infection and should not be used as the sole basis for treatment or other patient management decisions.  A negative result may occur with improper specimen collection / handling, submission of specimen other than nasopharyngeal swab, presence of viral mutation(s) within the areas targeted by this assay, and inadequate number of viral copies (<250 copies / mL). A negative result must be combined with clinical observations, patient history, and epidemiological information.  Fact Sheet for Patients:   StrictlyIdeas.no  Fact Sheet for Healthcare Providers: BankingDealers.co.za  This test is not yet approved or  cleared by the [place] FDA and has been authorized for detection and/or diagnosis of SARS-CoV-2 by FDA under an Emergency Use Authorization (EUA).  This EUA will remain in effect (meaning this test can be used) for the duration of the COVID-19 declaration under Section 564(b)(1) of the Act, 21 U.S.C. section 360bbb-3(b)(1), unless the authorization is  terminated or revoked sooner.  Performed at [facility], [place] [address] [place], [place] [address]   Urine culture     Status: None   Collection Time: 03/15/20  2:45 PM   Specimen: In/Out Cath Urine  Result Value Ref Range Status   Specimen Description IN/OUT CATH URINE  Final   Special Requests NONE  Final   Culture   Final    NO GROWTH Performed at [facility], [place] [address], [place], [place] [address]    Report Status 03/16/2020 FINAL  Final  C Difficile Quick Screen w PCR reflex     Status: None   Collection Time: 03/16/20  5:43 AM   Specimen: STOOL  Result Value Ref Range Status   C Diff antigen NEGATIVE NEGATIVE Final   C Diff toxin NEGATIVE NEGATIVE Final   C Diff interpretation No C. difficile detected.  Final    Comment: Performed at [facility], [place] [address], [place], [place] [address]  Respiratory Panel by PCR     Status: None   Collection Time: 03/19/20  7:22 PM   Specimen: Nasopharyngeal Swab; Respiratory  Result Value Ref Range Status   Adenovirus NOT DETECTED NOT DETECTED Final   Coronavirus 229E NOT DETECTED NOT DETECTED Final    Comment: (NOTE) The Coronavirus on the Respiratory Panel, DOES NOT test for the novel  Coronavirus (2019 nCoV)    Coronavirus HKU1 NOT DETECTED NOT DETECTED Final   Coronavirus NL63 NOT DETECTED NOT DETECTED Final   Coronavirus OC43 NOT DETECTED NOT DETECTED Final   Metapneumovirus NOT DETECTED NOT DETECTED Final   Rhinovirus / Enterovirus NOT DETECTED NOT DETECTED Final   Influenza A NOT DETECTED NOT DETECTED Final   Influenza B NOT DETECTED NOT DETECTED Final   Parainfluenza Virus 1 NOT DETECTED NOT DETECTED Final   Parainfluenza Virus 2 NOT DETECTED NOT DETECTED Final   Parainfluenza Virus 3 NOT DETECTED NOT DETECTED Final   Parainfluenza Virus 4 NOT DETECTED NOT DETECTED Final   Respiratory Syncytial Virus NOT DETECTED NOT DETECTED Final  Bordetella pertussis NOT DETECTED NOT DETECTED Final    Chlamydophila pneumoniae NOT DETECTED NOT DETECTED Final   Mycoplasma pneumoniae NOT DETECTED NOT DETECTED Final    Comment: Performed at [facility], [place] [address] [place], [place] [address]  MRSA PCR Screening     Status: None   Collection Time: 03/19/20  7:22 PM   Specimen: Nasal Mucosa; Nasopharyngeal  Result Value Ref Range Status   MRSA by PCR NEGATIVE NEGATIVE Final    Comment:        The GeneXpert MRSA Assay (FDA approved for NASAL specimens only), is one component of a comprehensive MRSA colonization surveillance program. It is not intended to diagnose MRSA infection nor to guide or monitor treatment for MRSA infections. Performed at [facility], [place] [address] [place], [place] [address]       Radiology Studies: DG CHEST PORT 1 VIEW  Result Date: 03/22/2020 CLINICAL DATA:  hypoxia EXAM: PORTABLE CHEST - 1 VIEW COMPARISON:  03/19/2020 FINDINGS: Improved aeration at the left lung base but some increase in interstitial opacities in the right upper lung. Stable left subclavian dual lead transvenous pacemaker. Heart size upper limits normal for technique. No effusion.  No pneumothorax. Visualized bones unremarkable. IMPRESSION: Worsening right upper lobe interstitial opacities. Electronically Signed   By: Lucrezia Europe M.D.   On: 03/22/2020 13:48     Scheduled Meds:  acyclovir  200 mg Oral BID   atorvastatin  10 mg Oral q1800   ciprofloxacin  1 drop Both Eyes Q4H while awake   finasteride  5 mg Oral Daily   Gerhardt's butt cream   Topical QID   melatonin  3 mg Oral QHS   methylPREDNISolone (SOLU-MEDROL) injection  40 mg Intravenous Q8H   multivitamin with minerals  1 tablet Oral Daily   mupirocin ointment  1 application Topical Daily   omega-3 acid ethyl esters  1 g Oral Daily   potassium chloride  40 mEq Oral Daily   rivaroxaban  20 mg Oral Q supper   sodium bicarbonate  650 mg Oral TID   sodium chloride flush  3 mL Intravenous Once    thiamine  100 mg Oral Daily   Continuous Infusions:  piperacillin-tazobactam (ZOSYN)  IV 3.375 g (03/23/20 0606)     LOS: 8 days    Time spent: 35 minutes spent in the coordination of care today.    Jonnie Finner, DO [facility]  If 7PM-7AM, please contact night-coverage [email or web address] 03/23/2020, 7:40 AM

## 2020-03-23 NOTE — Consult Note (Signed)
03/23/2020 Pulm f/u note  S: Seen in f/u for abnormal imaging. No events overnight. Patient thinks breathing is improved. He is a bit confused.   O: Blood pressure 105/65, pulse 71, temperature 98.1 F (36.7 C), temperature source Oral, resp. rate (!) 25, height 5' 10"  (1.778 m), weight 95.3 kg, SpO2 96 %.  Frail elderly man lying in bed Profoundly weak,better air movement today Skin with bruising, trace global anasarca  Pct neg ESR up ANA neg  A:  Abnormal CT in patient with MM on velcade, revlimid.  Trialing treatment for inflammatory causes given eosinophilia and nonresponse to antimicrobials.  Pct and lack of response to abx argues against infectious cause.  Occult aspiration always in differential.  Very poor bronchoscopy candidate with fraility, ongoing AC use.  P:  - Continue steroids - f/u MBSS - Progressive mobility, IS - Redirection as able - If does not bounce back with steroids, may need palliative care involvement. - Will follow with you   Erskine Emery MD

## 2020-03-23 NOTE — Consult Note (Signed)
Consultation Note Date: 03/23/2020   Patient Name: [name]  DOB: May 04, 1941  MRN: [id number]  Age / Sex: 79 y.o., male  PCP: [name], MD Referring Physician: Jonnie Finner, DO  Reason for Consultation: Establishing goals of care and Psychosocial/spiritual support  HPI/Patient Profile: 79 y.o. male admitted on 03/15/2020 with past medical history significant for multiple myeloma/last chemo on 6/22.  Patient admitted through the ER found to be hypotensive, chest x-ray without significant infiltrates, was admitted with diagnosis of sepsis from unclear etiology and placed on empiric antibiotics and IV fluids and cultures were obtained. Patient has had continued physical and functional decline over the past several months.  Initial procalcitonin slightly elevated at 2.9.  Patient continued with hypoxia and CT of the chest obtained demonstrated bilateral effusions, scattered areas of fibrotic infiltrates/chronic emphysema changes.  Pulmonary critical care has been consulted 2/2 -patient has been on 8 days of antibiotics, gentle diuresing and still remains hypoxic with an abnormal chest x-ray.   Both family and staff recognize intermittent confusion.  Patient is extremely hard of hearing.  Hearing aids are in the patient's closet but need new batteries Oncology has recommended to put systemic chemotherapy on hold   Patient and family face treatment option decisions, advanced directive decisions and anticipatory care needs.   Clinical Assessment and Goals of Care:  Thi needs.s NP Wadie Lessen reviewed medical records, received report from team, assessed the patient and then spoke to the patient's son/[name] by telephone patient's bedside  to discuss diagnosis, prognosis, GOC, EOL wishes disposition and options.   Concept of Palliative Care was introduced as specialized medical care for  people and their families living with serious illness.  If focuses on providing relief from the symptoms and stress of a serious illness.  The goal is to improve quality of life for both the patient and the family.  Created space and opportunity for family to explore thoughts and feelings regarding patient's current medical situation. Patient's son verbalizes great concern regarding any "signing" of any legal documents.  Voices his desire to have his attorney look at any document that is signed here in the hospital.  He tells me that his father had verbalized that he did not want his family making decisions for himself that he wanted "let the doctors make the decisions".  Education and support offered regarding the importance of a sitdown meeting with family and medical providers to discuss the overall medical situation, treatment option decisions, advanced directive decisions, anticipatory care needs and disposition options.     A  discussion was had today regarding advanced directives.  Concepts specific to code status, artifical feeding and hydration, continued IV antibiotics and rehospitalization was had.  The difference between a aggressive medical intervention path  and a palliative comfort care path for this patient at this time was had.  Values and goals of care important to patient and family were attempted to be elicited.   Questions and concerns addressed.  Patient  encouraged to call with  questions or concerns.     PMT will continue to support holistically.     No documented HPOA.  I believe the patient has 3 children     SUMMARY OF RECOMMENDATIONS    Code Status/Advance Care Planning:  Full code  Encouraged family to consider DNR/DNI status understanding evidenced based poor outcomes in similar hospitalized patient, as the cause of arrest is likely associated with advanced chronic illness rather than an easily reversible acute cardio-pulmonary event. Discussed with son medical  oncology's recommendation for no further systemic treatment for his multiple myeloma at this time secondary to his multiple comorbidities and poor functional status.   Palliative Prophylaxis:   Aspiration, Bowel Regimen, Delirium Protocol, Frequent Pain Assessment and Oral Care  Additional Recommendations (Limitations, Scope, Preferences):  Full Scope Treatment  Psycho-social/Spiritual:   Desire for further Chaplaincy support:no   Prognosis:   Unable to determine  Discharge Planning: To Be Determined      Primary Diagnoses: Present on Admission: . Sepsis ([place]) . Multiple myeloma ([place]) . Coronary artery disease-nonobstructive catheter in October 2011 . (Resolved) A-fib ([place]) . Acquired thrombophilia ([place]) . Essential hypertension . Lactic acidosis . Thrombocytopenia ([place]) . Atrial tachycardia/fibrillation . PACEMAKER, St. Jude dual-chamber . Hypoalbuminemia . Hyperbilirubinemia . Demand ischemia ([place]) . Elevated brain natriuretic peptide (BNP) level . Elevated procalcitonin . Anemia of chronic disease . Scalp hematoma . Obesity (BMI 30.0-34.9)   I have reviewed the medical record, interviewed the patient and family, and examined the patient. The following aspects are pertinent.  Past Medical History:  Diagnosis Date  . Atrial flutter ([place])    A. s/p prior ablation;  B.  s/p CTI ablation 10/24/10 (Dr. Caryl Comes)  . CAD (coronary artery disease)    A.  s/p CFX in the past;   B.  cath 06/2010: LAD 30-40%, D1 50%, OM1 50%, AVCFX stent 30%, dCFX 70-80% (med Rx), mRCA 40%;      C.  Echo 06/2010: EF 60-65%, mod LVH; mild LAE     . Complete heart block ([facility])    a. s/p STJ pacemaker  . Cubital tunnel syndrome   . Degenerative disc disease   . Heart block   . HOH (hard of hearing)   . Hx of adenomatous colonic polyps   . Hyperlipidemia   . Hypertension   . Multiple myeloma   . Persistent atrial fibrillation ([place])   . Sleep apnea   . Venous insufficiency    Social  History   Socioeconomic History  . Marital status: Married    Spouse name: Not on file  . Number of children: Not on file  . Years of education: Not on file  . Highest education level: Not on file  Occupational History  . Occupation: Retired from Retail banker: RETIRED  . Occupation: still farms part time  Tobacco Use  . Smoking status: Former Smoker    Packs/day: 1.00    Years: 49.00    Pack years: 49.00    Types: Cigarettes    Quit date: 09/12/2004    Years since quitting: 15.5  . Smokeless tobacco: Former Systems developer  . Tobacco comment: started at age 77; smoked 1 ppd; quit in 2006  Vaping Use  . Vaping Use: Never used  Substance and Sexual Activity  . Alcohol use: Yes    Alcohol/week: 28.0 standard drinks    Types: 28 Standard drinks or equivalent per week    Comment: occasional  . Drug use: No  . Sexual  activity: Not Currently  Other Topics Concern  . Not on file  Social History Narrative  . Not on file   Social Determinants of Health   Financial Resource Strain:   . Difficulty of Paying Living Expenses:   Food Insecurity:   . Worried About Charity fundraiser in the Last Year:   . Arboriculturist in the Last Year:   Transportation Needs:   . Film/video editor (Medical):   Marland Kitchen Lack of Transportation (Non-Medical):   Physical Activity:   . Days of Exercise per Week:   . Minutes of Exercise per Session:   Stress:   . Feeling of Stress :   Social Connections:   . Frequency of Communication with Friends and Family:   . Frequency of Social Gatherings with Friends and Family:   . Attends Religious Services:   . Active Member of Clubs or Organizations:   . Attends Archivist Meetings:   Marland Kitchen Marital Status:    Family History  Problem Relation Age of Onset  . Heart disease Father   . Cancer Father        prostate  . Heart disease Brother   . Colon cancer Neg Hx    Scheduled Meds: . acyclovir  200 mg Oral BID  . atorvastatin  10 mg  Oral q1800  . ciprofloxacin  1 drop Both Eyes Q4H while awake  . finasteride  5 mg Oral Daily  . Gerhardt's butt cream   Topical QID  . melatonin  3 mg Oral QHS  . methylPREDNISolone (SOLU-MEDROL) injection  40 mg Intravenous Q8H  . multivitamin with minerals  1 tablet Oral Daily  . mupirocin ointment  1 application Topical Daily  . omega-3 acid ethyl esters  1 g Oral Daily  . potassium chloride  40 mEq Oral Daily  . rivaroxaban  20 mg Oral Q supper  . sodium bicarbonate  650 mg Oral TID  . sodium chloride flush  3 mL Intravenous Once  . thiamine  100 mg Oral Daily   Continuous Infusions: . piperacillin-tazobactam (ZOSYN)  IV 3.375 g (03/23/20 0606)   PRN Meds:.acetaminophen **OR** acetaminophen, ketotifen, levalbuterol, loperamide, prochlorperazine, vitamin A & D Medications Prior to Admission:  Prior to Admission medications   Medication Sig Start Date End Date Taking? Authorizing Provider  acyclovir (ZOVIRAX) 200 MG capsule Take 1 capsule (200 mg total) by mouth 2 (two) times daily. 01/02/20  Yes Curt Bears, MD  atorvastatin (LIPITOR) 10 MG tablet Take 10 mg by mouth daily at 6 PM.    Yes [provider]  dexamethasone (DECADRON) 4 MG tablet 10 tablet p.o. daily weekly start with the first day of the chemotherapy. Patient taking differently: Take 40 mg by mouth once a week. start with the first day of the chemotherapy. 01/02/20  Yes Curt Bears, MD  finasteride (PROSCAR) 5 MG tablet Take 5 mg by mouth daily.   Yes [provider]  furosemide (LASIX) 40 MG tablet Take 1 tablet by mouth once daily Patient taking differently: Take 40 mg by mouth daily.  08/31/19  Yes Baldwin Jamaica, PA-C  ketotifen (ALAWAY) 0.025 % ophthalmic solution Place 1 drop into both eyes 2 (two) times daily as needed (for dry eyes).    Yes [provider]  lenalidomide (REVLIMID) 25 MG capsule 03/12/20-auth -[id number] adult male Take 1 capsule by mouth daily on days 1-14 then  7 days off. Repeat every 21 days. Patient taking differently: Take 25 mg  by mouth See admin instructions. 03/12/20-auth -[id number] adult male Take 1 capsule by mouth daily on days 1-14 then 7 days off. Repeat every 21 days. 03/12/20  Yes Curt Bears, MD  losartan (COZAAR) 100 MG tablet Take 100 mg by mouth daily.   Yes [provider]  Magnesium 200 MG TABS Take 1 tablet (200 mg total) by mouth daily. 05/08/18  Yes Sherran Needs, NP  Multiple Vitamin (MULTIVITAMIN) capsule Take 1 capsule by mouth daily.     Yes [provider]  mupirocin ointment (BACTROBAN) 2 % Apply 1 application topically daily. Apply with dressing change 02/15/20  Yes [provider]  Omega-3 Fatty Acids (FISH OIL) 1200 MG CAPS Take 1,200 mg by mouth daily.    Yes [provider]  Potassium Chloride ER 20 MEQ TBCR Take 1 tablet by mouth once daily Patient taking differently: Take 20 mEq by mouth daily.  07/04/19  Yes Baldwin Jamaica, PA-C  thiamine 100 MG tablet Take 100 mg by mouth daily.   Yes [provider]  XARELTO 20 MG TABS tablet TAKE 1 TABLET BY MOUTH ONCE DAILY WITH SUPPER Patient taking differently: Take 20 mg by mouth daily with supper.  02/03/20  Yes Deboraha Sprang, MD  doxycycline (VIBRAMYCIN) 100 MG capsule Take 100 mg by mouth 2 (two) times daily. Patient not taking: Reported on 03/16/2020 02/15/20 03/26/20  [provider]  EPINEPHrine 0.3 mg/0.3 mL IJ SOAJ injection Inject 0.3 mg into the muscle once as needed for anaphylaxis. 04/25/17   [provider]  nitroGLYCERIN (NITROSTAT) 0.4 MG SL tablet PLACE ONE TABLET UNDER THE TONGUE EVERY 5 MINUTES AS NEEDED FOR CHEST PAIN Patient taking differently: Place 0.4 mg under the tongue every 5 (five) minutes as needed for chest pain.  07/19/18   Josue Hector, MD  prochlorperazine (COMPAZINE) 10 MG tablet Take 1 tablet (10 mg total) by mouth every 6 (six) hours as needed for nausea or vomiting. 01/02/20    Curt Bears, MD  Vitamins A & D (VITAMIN A & D) ointment Apply 1 application topically as needed for dry skin. Patient not taking: Reported on 03/16/2020    [provider]   Allergies  Allergen Reactions  . Bee Venom Anaphylaxis        Review of Systems  Unable to perform ROS: Other    Physical Exam Constitutional:      Appearance: He is overweight. He is ill-appearing.     Interventions: Nasal cannula in place.  Cardiovascular:     Rate and Rhythm: Normal rate.  Skin:    General: Skin is warm and dry.  Neurological:     Mental Status: He is alert. He is disoriented.     Vital Signs: BP (!) 120/59 (BP Location: Left Arm)   Pulse 70   Temp 98.2 F (36.8 C) (Oral)   Resp (!) 21   Ht _0  (1.778 m)   Wt 95.3 kg   SpO2 (!) 84%   BMI 30.13 kg/m  Pain Scale: 0-10   Pain Score: 0-No pain   SpO2: SpO2: (!) 84 % O2 Device:SpO2: (!) 84 % O2 Flow Rate: .O2 Flow Rate (L/min): 2 L/min  IO: Intake/output summary:   Intake/Output Summary (Last 24 hours) at 03/23/2020 1040 Last data filed at 03/23/2020 0900 Gross per 24 hour  Intake --  Output 1200 ml  Net -1200 ml    LBM: Last BM Date: 03/20/20 Baseline Weight: Weight: 101.2 kg Most recent weight:  Weight: 95.3 kg     Palliative Assessment/Data: 40 % at best   Discussed with Dr Marylyn Ishihara  Late entry--- family call back and agrees to meet with the palliative medicine team however the soonest that both son and daughter can meet is this coming Thursday at 3 PM.   I let [name] know that I would be out of the hospital at that time and we will set up a meeting for that time with one of my colleagues.  Time In: 1200 Time Out: 1310 Time Total: 70 minutes Greater than 50%  of this time was spent counseling and coordinating care related to the above assessment and plan.  Signed by: Wadie Lessen, NP   Please contact Palliative Medicine Team phone at [phone] for questions and concerns.  For individual  provider: See Shea Evans

## 2020-03-23 NOTE — Progress Notes (Signed)
Physical Therapy Treatment Patient Details Name: [name] MRN: [id number] DOB: [birth year] Today's Date: 03/23/2020    History of Present Illness 79 y.o. male with medical history significant of multiple myeloma on chemotherapy, CAD, A. fib, hypertension presented with generalized weakness and increasing leg pain. Presents to ED after 2 falls in the last 2 days and c/o of chronic diarrhea. Admitted 03/15/20 for treatment of sepsis and weakness.    PT Comments    Pt agreeable to getting up to chair with therapy today. With initiation of mobility pt found to be on bedpan. Pt seemingly unaware. Pt requires mod A for coming to sidelying to remove bed pan and for coming into seated EoB. Pt requires increased time to gain balance sitting EoB progressing to not requiring assist for balance. Pt requires modAx2 for coming into standing and with initiation of gait. Pt able to move bilateral LE x1 each and then pivots hips towards chair requiring maxAx2 for safely sitting in recliner. Pt with incontinence of stool with transfer requiring rolling in fully recliner recliner, while rolling for pericare lift pad placed for safe lift back to bed after sitting up. D/c plans remain appropriate. PT will continue to follow acutely.    Follow Up Recommendations  SNF     Equipment Recommendations  Other (comment) (TBD at next venue)    Recommendations for Other Services OT consult     Precautions / Restrictions Precautions Precautions: Fall Precaution Comments: fall prior to hospitalization  Restrictions Weight Bearing Restrictions: No    Mobility  Bed Mobility Overal bed mobility: Needs Assistance Bed Mobility: Rolling;Sidelying to Sit Rolling: Mod assist Sidelying to sit: Mod assist       General bed mobility comments: modA for coming into sidelying to remove bed pan, once in sidelying pt able to hold himself in sidelying with bed rail, after clean pt able to manage LE off EoB but requires modA  for bringing trunk to upright  Transfers Overall transfer level: Needs assistance Equipment used: 2 person hand held assist;1 person hand held assist Transfers: Sit to/from Stand Sit to Stand: Mod assist;+2 physical assistance;Max assist         General transfer comment: modAx2 to powerup and steady in standing with pt holding onto therapist elbows increased cuing for upright posture with standing at EoB. maxAx1 for clearing hips from recliner surface for placement of lift pad  Ambulation/Gait Ambulation/Gait assistance: Max assist;+2 physical assistance Gait Distance (Feet): 2 Feet Assistive device: 2 person hand held assist Gait Pattern/deviations: Step-to pattern;Decreased step length - right;Decreased step length - left;Shuffle Gait velocity: slowed Gait velocity interpretation: <1.31 ft/sec, indicative of household ambulator General Gait Details: maxAx2 for steps to recliner on L, pt with decreased ablity to weightshift for LE advancement, ultimately pt pivots hips without moving feet and short sits in recliner         Balance Overall balance assessment: Needs assistance Sitting-balance support: Feet supported;Feet unsupported;No upper extremity supported;Bilateral upper extremity supported;Single extremity supported Sitting balance-Leahy Scale: Poor Sitting balance - Comments: able to progress from max A for balance to min guard for balance with bilateral UE raised    Standing balance support: Bilateral upper extremity supported;During functional activity Standing balance-Leahy Scale: Zero                              Cognition Arousal/Alertness: Awake/alert Behavior During Therapy: WFL for tasks assessed/performed Overall Cognitive Status: Impaired/Different from baseline Area of  Impairment: Following commands;Awareness                       Following Commands: Follows one step commands with increased time;Follows multi-step commands with  increased time   Awareness: Emergent   General Comments: pt requires increased time and cuing for task completion          General Comments General comments (skin integrity, edema, etc.): Pt on 6 L O2 via [place] SaO2 >90%O2 throughout session       Pertinent Vitals/Pain Pain Assessment: No/denies pain           PT Goals (current goals can now be found in the care plan section) Acute Rehab PT Goals Patient Stated Goal: have less pain PT Goal Formulation: With patient Time For Goal Achievement: 03/30/20 Potential to Achieve Goals: Fair Progress towards PT goals: Progressing toward goals    Frequency    Min 2X/week      PT Plan Current plan remains appropriate       AM-PAC PT "6 Clicks" Mobility   Outcome Measure  Help needed turning from your back to your side while in a flat bed without using bedrails?: Total Help needed moving from lying on your back to sitting on the side of a flat bed without using bedrails?: Total Help needed moving to and from a bed to a chair (including a wheelchair)?: Total Help needed standing up from a chair using your arms (e.g., wheelchair or bedside chair)?: Total Help needed to walk in hospital room?: Total Help needed climbing 3-5 steps with a railing? : Total 6 Click Score: 6    End of Session Equipment Utilized During Treatment: Back brace;Oxygen Activity Tolerance: Patient tolerated treatment well Patient left: with call bell/phone within reach;Other (comment);in chair;with chair alarm set (lift pad placed for back to bed) Nurse Communication: Mobility status;Other (comment) (need for air bed ) PT Visit Diagnosis: Muscle weakness (generalized) (M62.81);Pain Pain - Right/Left:  (L>R) Pain - part of body: Hip;Knee     Time: 1029-1105 PT Time Calculation (min) (ACUTE ONLY): 36 min  Charges:  $Therapeutic Activity: 23-37 mins                     Annalise Mcdiarmid B. Migdalia Dk PT, DPT Acute Rehabilitation Services Pager [phone] Office [phone]    [place] 03/23/2020, 1:29 PM

## 2020-03-24 ENCOUNTER — Inpatient Hospital Stay: Payer: Medicare HMO

## 2020-03-24 ENCOUNTER — Inpatient Hospital Stay (HOSPITAL_COMMUNITY): Payer: Medicare HMO

## 2020-03-24 DIAGNOSIS — Z515 Encounter for palliative care: Secondary | ICD-10-CM

## 2020-03-24 DIAGNOSIS — Z79899 Other long term (current) drug therapy: Secondary | ICD-10-CM

## 2020-03-24 DIAGNOSIS — R0902 Hypoxemia: Secondary | ICD-10-CM

## 2020-03-24 DIAGNOSIS — D84821 Immunodeficiency due to drugs: Secondary | ICD-10-CM

## 2020-03-24 LAB — BASIC METABOLIC PANEL
Anion gap: 14 (ref 5–15)
BUN: 33 mg/dL — ABNORMAL HIGH (ref 8–23)
CO2: 22 mmol/L (ref 22–32)
Calcium: 9.1 mg/dL (ref 8.9–10.3)
Chloride: 105 mmol/L (ref 98–111)
Creatinine, Ser: 0.97 mg/dL (ref 0.61–1.24)
GFR calc Af Amer: >60 mL/min (ref >60)
GFR calc non Af Amer: >60 mL/min (ref >60)
Glucose, Bld: 162 mg/dL — ABNORMAL HIGH (ref 70–99)
Potassium: 4 mmol/L (ref 3.5–5.1)
Sodium: 141 mmol/L (ref 135–145)

## 2020-03-24 LAB — MAGNESIUM: Magnesium: 2.2 mg/dL (ref 1.7–2.4)

## 2020-03-24 MED ORDER — PREDNISONE 20 MG PO TABS: 20.0000 mg | ORAL_TABLET | Freq: Every day | ORAL | Status: DC

## 2020-03-24 MED ORDER — PREDNISONE 20 MG PO TABS
40.0000 mg | ORAL_TABLET | Freq: Every day | ORAL | Status: DC
  Administered 2020-03-25 – 2020-03-27 (×3): 40 mg via ORAL
  Filled 2020-03-24 (×4): qty 2

## 2020-03-24 MED ORDER — PREDNISONE 10 MG PO TABS: 10.0000 mg | ORAL_TABLET | Freq: Every day | ORAL | Status: DC

## 2020-03-24 MED ORDER — RESOURCE THICKENUP CLEAR PO POWD
ORAL | Status: DC | PRN
  Filled 2020-03-24: qty 125

## 2020-03-24 NOTE — Progress Notes (Signed)
NAME:  [name], MRN:  [id number], DOB:  [birth year], LOS: 9 ADMISSION DATE:  03/15/2020, CONSULTATION DATE:  7/11 REFERRING MD:  Marylyn Ishihara, CHIEF COMPLAINT:  Abnormal cxr   Brief History   79 year old male w/ multiple myeloma (on chemo->last rx 6/22 see below). Admitted initially w/ working dx of sepsis source not clear. PCCM asked to admit as in spite of 8 days abx and gentle diuresis still having hypoxia and abnormal CXR   History of present illness   79 year old male patient with history as mentioned below presented on 7/4 with chief complaint of increasing leg pain low-grade fever and weakness.  In the ER was found to be hypotensive, chest x-ray initially without significant infiltrates, was admitted with diagnosis of sepsis of unclear etiology  and placed on empiric antibiotics in addition to IV fluids cultures were obtained.  Initial procalcitonin slightly elevated at 2.19.  Was treated with IV empiric antibiotics, improved clinically however continued to be hypoxic a CT of chest was obtained which demonstrated bilateral effusions scattered areas of fibrotic infiltrates on the background of chronic emphysema changes.  IV antibiotics were continued, he was challenged with IV Lasix. Further evaluation of lab work demonstrates ongoing elevated eosinophils, respiratory viral panel sent this was negative, today he is essentially euvolemic according to his intake output balance a follow-up chest x-ray was obtained on 7/1 as patient was still oxygen dependent demonstrating increasing upper lobe opacities and because of this pulmonary asked to evaluate  Past Medical History  Multiple myeloma on chemotherapy n status post 3 cycles of Velcade, Revlimid, and Decadron, last treatment 6/29, coronary artery disease, atrial fibrillation, hypertension, internal pacemaker, chronic anticoagulation , anemia of chronic disease.  Significant Hospital Events   7/4 admitted  7/11 pccm consulted for pulm infiltrates  and new oxygen need in spite of abx and diuretics.   Consults:  PCCM   Procedures:    Significant Diagnostic Tests:  CT chest 7/8: 1. No pulmonary embolus allowing for motion artifact.2. Multi chamber cardiomegaly with small pericardial effusion.Contrast refluxing into the hepatic veins and IVC consistent with elevated right heart pressures.3. Small to moderate bilateral pleural effusions with compressive atelectasis. Mild pulmonary edema.4. Patchy ground-glass opacities in the anterior and posterior right upper lobe may be pulmonary edema or infectious, including atypical viral organisms.5. Shotty mediastinal and hilar nodes are likely reactive.  Micro Data:  UC 7/5: neg RVP neg BC 7/9: neg  Antimicrobials:  Cefepime 7/4>>>7/7 Zosyn 7/8>>>7/12 Vanco 7/4>>7/6;  Restart 7/8>>7/10  Interim history/subjective:  Remains weak and pleasantly confused. Remains on 4 L [place] with sats of 97%, RR is 17 ( Flow Sheet states 2 L , but visual check he is on 4 L [place]) Afebrile, WBC 10.5 Net negative 1450 cc's Objective   Blood pressure (!) 88/48, pulse 70, temperature 98 F (36.7 C), temperature source Oral, resp. rate 20, height _0  (1.778 m), weight 99.8 kg, SpO2 91 %.        Intake/Output Summary (Last 24 hours) at 03/24/2020 0817 Last data filed at 03/23/2020 1500 Gross per 24 hour  Intake --  Output 400 ml  Net -400 ml   Filed Weights   03/22/20 0521 03/23/20 0344 03/24/20 0339  Weight: 95.3 kg 95.3 kg 99.8 kg    Examination: General: elderly male,supine in bed, wearing oxygen, in NAD, deconditioned HENT: NCAT, No JVD, MM pink and dry Lungs: Bilateral chest excursion, Diminished bilaterally per bases,  Cardiovascular: S1, S2, RRR, No RMG Abdomen: Soft  non tender, ND, BS +, Body mass index is 31.57 kg/m. Extremities: No obvious deformities,Trace lower extremities edema, feet cool to touch, brisk cap refill  Neuro: Awake and oriented to self and place, but slow to respond,  pleasantly confused   Resolved Hospital Problem list     Assessment & Plan:  Acute hypoxic respiratory failure  Pulmonary infiltrates of unclear etiology  Multiple myeloma History of atrial fibrillation on anticoagulation History coronary artery disease Sepsis in the setting of immunocompromising drugs etiology unclear History of hypertension Generalized weakness Thrombocytopenia    Acute hypoxic respiratory failure in setting of pulmonary infiltrates of unclear etiology ( Infection vs volume overload vs drug induced pneumonitis from chemo vs progressive MM) ABX course has been completed Afebrile He is a poor candidate for bronchoscopy given debilitated state, also on long-term anticoagulation Plan Titrate oxygen for sats> 94% Lasix on hold for now For MBS 7/13 per speech to eval for aspiration Continue prednisone for now CXR in am and prn to evaluate for improvement of infiltrates Aggressive Pulmonary Toilet OOB to chair IS Q 1 while awake if able  Discussion Follow up CXR in am to evaluate for any improvement on steroids. No significant clinical improvement. Needs more aggressive pulmonary toilet Palliative care has met with family, patient remains a full code, but family have been asked to consider DNR/ DNI status. Medical oncology have recommended holding chemo in light of patient's debilitated status and co- morbidities.     Magdalen Spatz, MSN, AGACNP-BC [place] for personal pager PCCM on call pager [phone] [place] 03/24/2020 9:14 AM    Best practice:  Per primary  Labs   CBC: Recent Labs  Lab 03/19/20 0247 03/20/20 1216 03/21/20 0348 03/22/20 0343 03/23/20 0837  WBC 9.5 10.3 10.1 9.3 10.5  NEUTROABS 5.2 5.1 5.5 5.2 8.9*  HGB 10.8* 10.6* 10.3* 11.1* 11.6*  HCT 31.1* 30.8* 30.1* 32.7* 33.9*  MCV 96.0 96.9 95.9 96.5 96.0  PLT 164 250 260 340 438*    Basic Metabolic  Panel: Recent Labs  Lab 03/20/20 1216 03/21/20 0348 03/22/20 0343 03/23/20 0837 03/24/20 0358  NA 140 141 143 141 141  K 4.2 3.9 4.0 4.2 4.0  CL 112* 114* 110 106 105  CO2 17* 17* 21* 22 22  GLUCOSE 106* 109* 98 159* 162*  BUN _0 26* 33*  CREATININE 0.87 0.92 0.89 0.91 0.97  CALCIUM 8.5* 8.5* 8.6* 8.8* 9.1  MG 1.6* 1.5* 2.0 2.0 2.2   GFR: Estimated Creatinine Clearance: 71.9 mL/min (by C-G formula based on SCr of 0.97 mg/dL). Recent Labs  Lab 03/18/20 0700 03/19/20 0247 03/19/20 1048 03/19/20 1612 03/20/20 1216 03/21/20 0348 03/22/20 0343 03/22/20 1600 03/23/20 0837  PROCALCITON 1.98  --   --   --   --   --   --  0.27  --   WBC 9.2   < >  --   --  10.3 10.1 9.3  --  10.5  LATICACIDVEN  --   --  1.8 1.6  --   --   --   --   --    < > = values in this interval not displayed.    Liver Function Tests: Recent Labs  Lab 03/19/20 0247 03/20/20 1216 03/21/20 0348 03/22/20 0343  AST 54* 63* 63* 54*  ALT 33 38 39 36  ALKPHOS 34* 36* 42 43  BILITOT 1.5* 1.6* 1.5* 1.0  PROT 5.0* 5.5* 5.3* 5.9*  ALBUMIN 2.3* 2.6* 2.4* 2.4*   No results for input(s): LIPASE, AMYLASE in the last 168 hours. Recent Labs  Lab 03/18/20 1648  AMMONIA 16    ABG    Component Value Date/Time   PHART 7.465 (H) 03/19/2020 1125   PCO2ART 21.3 (L) 03/19/2020 1125   PO2ART 68.6 (L) 03/19/2020 1125   HCO3 15.1 (L) 03/19/2020 1125   TCO2 23 04/28/2013 1911   ACIDBASEDEF 8.0 (H) 03/19/2020 1125   O2SAT 94.2 03/19/2020 1125     Coagulation Profile: No results for input(s): INR, PROTIME in the last 168 hours.  Cardiac Enzymes: No results for input(s): CKTOTAL, CKMB, CKMBINDEX, TROPONINI in the last 168 hours.  HbA1C: No results found for: HGBA1C  CBG: No results for input(s): GLUCAP in the last 168 hours.  Review of Systems:      Past Medical History  He,  has a past medical history of Atrial flutter ([place]), CAD (coronary artery disease), Complete heart block ([place]), Cubital  tunnel syndrome, Degenerative disc disease, Heart block, HOH (hard of hearing), adenomatous colonic polyps, Hyperlipidemia, Hypertension, Multiple myeloma, Persistent atrial fibrillation ([place]), Sleep apnea, and Venous insufficiency.   Surgical History    Past Surgical History:  Procedure Laterality Date  . CARDIOVERSION N/A 04/20/2018   Procedure: CARDIOVERSION;  Surgeon: Skeet Latch, MD;  Location: [facility];  Service: Cardiovascular;  Laterality: N/A;  . COLONOSCOPY  10-07-2009   TA polyp, tics, hems   . CORONARY ANGIOPLASTY WITH STENT PLACEMENT  2005  . PACEMAKER PLACEMENT  2011  . POLYPECTOMY    . PPM GENERATOR CHANGEOUT N/A 09/27/2019   Procedure: PPM GENERATOR CHANGEOUT;  Surgeon: Deboraha Sprang, MD;  Location: [facility];  Service: Cardiovascular;  Laterality: N/A;  . Right olecranon nerve surgery    . VENTRAL HERNIA REPAIR       Social History   reports that he quit smoking about 15 years ago. His smoking use included cigarettes. He has a 49.00 pack-year smoking history. He has quit using smokeless tobacco. He reports current alcohol use of about 28.0 standard drinks of alcohol per week. He reports that he does not use drugs.   Family History   His family history includes Cancer in his father; Heart disease in his brother and father. There is no history of Colon cancer.   Allergies Allergies  Allergen Reactions  . Bee Venom Anaphylaxis          Home Medications  Prior to Admission medications   Medication Sig Start Date End Date Taking? Authorizing Provider  acyclovir (ZOVIRAX) 200 MG capsule Take 1 capsule (200 mg total) by mouth 2 (two) times daily. 01/02/20  Yes Curt Bears, MD  atorvastatin (LIPITOR) 10 MG tablet Take 10 mg by mouth daily at 6 PM.    Yes [provider]  dexamethasone (DECADRON) 4 MG tablet 10 tablet p.o. daily weekly start with the first day of the chemotherapy. Patient taking differently: Take 40 mg by mouth once a week.  start with the first day of the chemotherapy. 01/02/20  Yes Curt Bears, MD  finasteride (PROSCAR) 5 MG tablet Take 5 mg by mouth daily.   Yes [provider]  furosemide (LASIX) 40 MG tablet Take 1 tablet by mouth once daily Patient taking differently: Take 40 mg by mouth daily.  08/31/19  Yes Baldwin Jamaica, PA-C  ketotifen (ALAWAY) 0.025 % ophthalmic solution Place 1 drop into both eyes 2 (two) times daily as needed (for dry eyes).  Yes [provider]  lenalidomide (REVLIMID) 25 MG capsule 03/12/20-auth -[id number] adult male Take 1 capsule by mouth daily on days 1-14 then 7 days off. Repeat every 21 days. Patient taking differently: Take 25 mg by mouth See admin instructions. 03/12/20-auth -[id number] adult male Take 1 capsule by mouth daily on days 1-14 then 7 days off. Repeat every 21 days. 03/12/20  Yes Curt Bears, MD  losartan (COZAAR) 100 MG tablet Take 100 mg by mouth daily.   Yes [provider]  Magnesium 200 MG TABS Take 1 tablet (200 mg total) by mouth daily. 05/08/18  Yes Sherran Needs, NP  Multiple Vitamin (MULTIVITAMIN) capsule Take 1 capsule by mouth daily.     Yes [provider]  mupirocin ointment (BACTROBAN) 2 % Apply 1 application topically daily. Apply with dressing change 02/15/20  Yes [provider]  Omega-3 Fatty Acids (FISH OIL) 1200 MG CAPS Take 1,200 mg by mouth daily.    Yes [provider]  Potassium Chloride ER 20 MEQ TBCR Take 1 tablet by mouth once daily Patient taking differently: Take 20 mEq by mouth daily.  07/04/19  Yes Baldwin Jamaica, PA-C  thiamine 100 MG tablet Take 100 mg by mouth daily.   Yes [provider]  XARELTO 20 MG TABS tablet TAKE 1 TABLET BY MOUTH ONCE DAILY WITH SUPPER Patient taking differently: Take 20 mg by mouth daily with supper.  02/03/20  Yes Deboraha Sprang, MD  doxycycline (VIBRAMYCIN) 100 MG capsule Take 100 mg by mouth 2 (two) times daily. Patient not taking:  Reported on 03/16/2020 02/15/20 03/26/20  [provider]  EPINEPHrine 0.3 mg/0.3 mL IJ SOAJ injection Inject 0.3 mg into the muscle once as needed for anaphylaxis. 04/25/17   [provider]  nitroGLYCERIN (NITROSTAT) 0.4 MG SL tablet PLACE ONE TABLET UNDER THE TONGUE EVERY 5 MINUTES AS NEEDED FOR CHEST PAIN Patient taking differently: Place 0.4 mg under the tongue every 5 (five) minutes as needed for chest pain.  07/19/18   Josue Hector, MD  prochlorperazine (COMPAZINE) 10 MG tablet Take 1 tablet (10 mg total) by mouth every 6 (six) hours as needed for nausea or vomiting. 01/02/20   Curt Bears, MD  Vitamins A & D (VITAMIN A & D) ointment Apply 1 application topically as needed for dry skin. Patient not taking: Reported on 03/16/2020    [provider]     Critical care time: NA    Erick Colace ACNP-BC [place] Pager # [phone] OR # [phone] if no answer

## 2020-03-24 NOTE — Progress Notes (Signed)
Modified Barium Swallow Progress Note  Patient Details  Name: [name] MRN: [id number] Date of Birth: 1941-01-02  Today's Date: 03/24/2020  Modified Barium Swallow completed.  Full report located under Chart Review in the Imaging Section.  Brief recommendations include the following:  Clinical Impression  Pt presents with pharyngeal dysphagia characterized by reduced lingual retraction, reduced anterior laryngeal movement, and inconistently incomplete epiglottic inversion. He demonstrated vallecular residue and penetration (PAS 3,5) of thin liquids with ultimate silent aspiration (PAS 8). Frequency of penetration was reduced with reduced bolus sizes but it was not eliminated. Pt exhibited difficulty with A-P transport of the barium tablet with nectar thick liquids, but less difficulty was noted when it was adminsitered with puree and the oral phase of his swallow was [facility]. A dysphagia 3 diet with nectar thick liquids is recommended at this time. SLP will follow for dysphagia treatment.     Swallow Evaluation Recommendations       SLP Diet Recommendations: Dysphagia 3 (Mech soft) solids;Nectar thick liquid   Liquid Administration via: Cup;Straw   Medication Administration: Whole meds with puree (or nectar thick liquids. )   Supervision: Staff to assist with self feeding   Compensations: Slow rate;Small sips/bites   Postural Changes: Seated upright at 90 degrees   Oral Care Recommendations: Oral care BID      Giulio Bertino I. Hardin Negus, [place], [place] Office number [phone] Pager [phone]  Horton Marshall 03/24/2020,1:49 PM

## 2020-03-24 NOTE — Progress Notes (Signed)
Patient ID: [name], male   DOB: 1941-05-29, 79 y.o.   MRN: [id number]  This NP visited patient at the bedside as a follow up to  yesterday's [place].  [name] and I spoke by telephone regarding the patient's current medical situation; diagnosis, prognosis, goals of care, treatment option decisions, advanced directive decisions and anticipatory care needs.  Patient has had continued physical, functional and cognitive decline in spite of aggressive medical; interventions.  Patient's son continues to verbalize anxiety around responsibility for medical decisions.  He agrees that the patient himself does not have capacity to make decisions at this time.  He tells me that he and his family have legal support regarding the patient's decision as it relates to treatment plan into the future and transition of care decisions.  Education offered regarding the role of the palliative medicine team in  helping family gather information, answer questions and concerns, explore treatment option decisions, advanced directive decisions and anticipatory care needs.  I assured Mr. Wild that family would not be asked to make any definitive decisions until they are informed and comfortable with the situation.  Continue with current treatment plan, family is open to all offered and available medical interventions to prolong life.  Education offered on the importance of continued conversation with family and the  medical providers regarding overall plan of care ensuring decisions are within the context of the patients values and GOCs.  Family agree to meet on Thursday, July 15 at 3 PM with a palliative medicine provider.  I made the family aware that I personally would not be here in the hospital for that meeting but would arrange to have one of my colleagues meet with them at the patient's room for a continued goals of care discussion.  Family is encouraged to call with questions or concerns.  PMT  will continue to support holistically.  Questions and concerns addressed   Discussed with Dr Marylyn Ishihara   Total time spent on the unit was 35 minutes  Greater than 50% of the time was spent in counseling and coordination of care  Wadie Lessen NP  Palliative Medicine Team Team Phone # [phone] Pager [phone]

## 2020-03-24 NOTE — Progress Notes (Signed)
PROGRESS NOTE    [name]  GMW:[id number] DOB: Jul 15, 1941 DOA: 03/15/2020 PCP: [name], MD   Brief Narrative:   79 year old male with history of multiple myeloma on chemotherapy, CAD, A. fib, hypertension presented with generalized weakness and increasing leg pain on 03/15/2020. On presentation, he had a low-grade temperature his blood pressure was on the lower side but there was no tachycardia or hypoxia. Urinalysis was unremarkable; chest x-ray showed no infiltrate. He is slightly elevated lactic acid. He was started on broad-spectrum antibiotics and IV fluids  7/13: Mentation still waxing and waning. Stop abx. Getting swallow study today. Continue to wean O2. Palliative Care to have family meeting tomorrow.   Assessment & Plan:   Active Problems:   Multiple myeloma ([place])   Essential hypertension   Atrial tachycardia/fibrillation   PACEMAKER, [employer] dual-chamber   Coronary artery disease-nonobstructive catheter in October 2011   Sepsis ([place])   Pressure injury of skin   Immunosuppressed due to chemotherapy   Acquired thrombophilia ([place])   Generalized weakness   Frequent falls   Long term (current) use of anticoagulants   Lactic acidosis   Thrombocytopenia ([facility])   Hypoalbuminemia   Hyperbilirubinemia   Demand ischemia ([facility])   Elevated brain natriuretic peptide (BNP) level   Elevated procalcitonin   Hypokalemia   Anemia of chronic disease   Scalp hematoma   Obesity (BMI 30.0-34.9)  Sepsis: Present on admission - Unclear source. - tachypnea, hypotension, elevated lactic acid at admission - UA was unremarkable. Chest x-ray showed no infiltrates. No cellulitic changes. CT of the abdomen and pelvis with contrast showed diverticulosis without diverticulitis. Bld Cx NTD. Procal elevated, but now down to 1.98 - Stool for C. difficile negative. COVID-19 testing negative. - was started on IV fluids, vancomycin and cefepime. Cultures negative so  far. - cefepime d/c'd d/t no source ID'd and mentation changes; CXR/CTA PE ordered as he has become hypoxic. Hold abx for now until source is ID'd. - CXR negative, CTA PE negative for PE, but showed "Patchy ground-glass opacities in the anterior and posterior right upper lobe may be pulmonary edema or infectious, including atypical viral organisms." It also showed "Small to moderate bilateral pleural effusions with compressive atelectasis. Mild pulmonary edema." RVP was negative. Broad spec abx resumed. He will received lasix IV. -WBC ok. MRSA swab negative.Vanc stopped.Azithro has completed. -Acute illness is presenting him from being completely mentally clear this AM. He is A&O x 3, but still with confusion. - Appreciate pulm assistance. Started on steroids, lasix held. I am in agreement that we've likely outrun the usefulness of abx in this case; let's d/c.   - 7/13: abx d/c'd. Tapering steroids. Swallow study today. Wean O2 as able.   Multiple myeloma, currently on chemotherapy - follows with Dr. Earlie Server - per onco: "smoldering multiple myeloma that was recently started on systemic therapy with subcutaneous Velcade, Revlimid and Decadron because of the progressive nature of his disease. The patient has been tolerating this treatment well with no concerning adverse effects and he is status post 3 cycles." - per onco: "strongly recommend for the patient to discontinue his systemic chemotherapy for the multiple myeloma at this point. I will arrange for him a follow-up visit with me in few weeks after his discharge for evaluation and repeat myeloma panel."  Thrombocytopenia - Probably from above. - no evidence of bleed, monitor - resolved  Normocytic anemia - no evidence of bleed, Hgb stable  Hypokalemia Hypomagnesemia -Resolved  Generalized weakness aFTT -  PT recommends SNF placement. Social worker consult     -  Palliative care onboard, family conference tomorrow  Hypertension - BP still soft,lasix reduced to home dose; follow; resume remainder home meds when tolerated     - 7/13: lasix and home meds held  Paroxysmal A. fib - Paced rhythm. Continue Xarelto - echo:Left Ventricle: Left ventricular ejection fraction, by estimation, is 55 to 60%. The left ventricle has normal function. The left ventricle has no regional wall motion abnormalities. Definity contrast agent was given IV to delineate the left ventricular  endocardial borders. The left ventricular internal cavity size was normal in size. There is mild left ventricular hypertrophy. Left ventricular diastolic parameters are indeterminate.  Dyslipidemia - Continue statin  Metabolic acidosis - follow up lactic acid -d/c bicarb  Conjunctivitis left eye - cipro eye drops  DVT prophylaxis: xarelto Code Status: FULL Family Communication: None at bedside   Status is: Inpatient  Remains inpatient appropriate because:Inpatient level of care appropriate due to severity of illness   Dispo: The patient is from: Home              Anticipated d/c is to: SNF              Anticipated d/c date is: 3 days              Patient currently is not medically stable to d/c.  Consultants:   PCCM  Palliative Care  Oncology  Antimicrobials:  . acyclovir   Subjective: "I'll bring you some tomatoes this weekend for a salad."  Objective: Vitals:   03/23/20 1637 03/23/20 2216 03/24/20 0100 03/24/20 0339  BP: (!) 114/57 107/63 115/61   Pulse: 69     Resp: 17     Temp: 98.6 F (37 C) 98.8 F (37.1 C)  98.2 F (36.8 C)  TempSrc: Oral Oral  Oral  SpO2: 93%     Weight:    99.8 kg  Height:        Intake/Output Summary (Last 24 hours) at 03/24/2020 0744 Last data filed at 03/23/2020 1500 Gross per 24 hour  Intake --  Output 400 ml  Net -400 ml   Filed Weights   03/22/20 0521 03/23/20 0344 03/24/20  0339  Weight: 95.3 kg 95.3 kg 99.8 kg    Examination:  General: 79 y.o. male resting in bed in NAD Cardiovascular: RRR, +S1, S2, no m/g/rt Respiratory: CTABL, no w/r/r, normal WOB on 2L [place] GI: BS+, NDNT, soft MSK: No e/c/c Neuro: A&O x name/president, general global weakness Psyc: calm/cooperative; but generally confused   Data Reviewed: I have personally reviewed following labs and imaging studies.  CBC: Recent Labs  Lab 03/19/20 0247 03/20/20 1216 03/21/20 0348 03/22/20 0343 03/23/20 0837  WBC 9.5 10.3 10.1 9.3 10.5  NEUTROABS 5.2 5.1 5.5 5.2 8.9*  HGB 10.8* 10.6* 10.3* 11.1* 11.6*  HCT 31.1* 30.8* 30.1* 32.7* 33.9*  MCV 96.0 96.9 95.9 96.5 96.0  PLT 164 250 260 340 798*   Basic Metabolic Panel: Recent Labs  Lab 03/20/20 1216 03/21/20 0348 03/22/20 0343 03/23/20 0837 03/24/20 0358  NA 140 141 143 141 141  K 4.2 3.9 4.0 4.2 4.0  CL 112* 114* 110 106 105  CO2 17* 17* 21* 22 22  GLUCOSE 106* 109* 98 159* 162*  BUN 18 20 21  26* 33*  CREATININE 0.87 0.92 0.89 0.91 0.97  CALCIUM 8.5* 8.5* 8.6* 8.8* 9.1  MG 1.6* 1.5* 2.0 2.0 2.2   GFR: Estimated Creatinine Clearance:  71.9 mL/min (by C-G formula based on SCr of 0.97 mg/dL). Liver Function Tests: Recent Labs  Lab 03/19/20 0247 03/20/20 1216 03/21/20 0348 03/22/20 0343  AST 54* 63* 63* 54*  ALT 33 38 39 36  ALKPHOS 34* 36* 42 43  BILITOT 1.5* 1.6* 1.5* 1.0  PROT 5.0* 5.5* 5.3* 5.9*  ALBUMIN 2.3* 2.6* 2.4* 2.4*   No results for input(s): LIPASE, AMYLASE in the last 168 hours. Recent Labs  Lab 03/18/20 1648  AMMONIA 16   Coagulation Profile: No results for input(s): INR, PROTIME in the last 168 hours. Cardiac Enzymes: No results for input(s): CKTOTAL, CKMB, CKMBINDEX, TROPONINI in the last 168 hours. BNP (last 3 results) No results for input(s): PROBNP in the last 8760 hours. HbA1C: No results for input(s): HGBA1C in the last 72 hours. CBG: No results for input(s): GLUCAP in the last 168  hours. Lipid Profile: No results for input(s): CHOL, HDL, LDLCALC, TRIG, CHOLHDL, LDLDIRECT in the last 72 hours. Thyroid Function Tests: No results for input(s): TSH, T4TOTAL, FREET4, T3FREE, THYROIDAB in the last 72 hours. Anemia Panel: No results for input(s): VITAMINB12, FOLATE, FERRITIN, TIBC, IRON, RETICCTPCT in the last 72 hours. Sepsis Labs: Recent Labs  Lab 03/18/20 0700 03/19/20 1048 03/19/20 1612 03/22/20 1600  PROCALCITON 1.98  --   --  0.27  LATICACIDVEN  --  1.8 1.6  --     Recent Results (from the past 240 hour(s))  Culture, blood (routine x 2)     Status: None   Collection Time: 03/15/20 12:08 PM   Specimen: BLOOD LEFT ARM  Result Value Ref Range Status   Specimen Description BLOOD LEFT ARM  Final   Special Requests   Final    BOTTLES DRAWN AEROBIC AND ANAEROBIC Blood Culture adequate volume   Culture   Final    NO GROWTH 5 DAYS Performed at [facility] [address], [place], [place] [address]    Report Status 03/20/2020 FINAL  Final  Culture, blood (routine x 2)     Status: None   Collection Time: 03/15/20 12:09 PM   Specimen: BLOOD  Result Value Ref Range Status   Specimen Description BLOOD LEFT ANTECUBITAL  Final   Special Requests   Final    BOTTLES DRAWN AEROBIC AND ANAEROBIC Blood Culture adequate volume   Culture   Final    NO GROWTH 5 DAYS Performed at [facility], [place] [address] [place],  [address]    Report Status 03/20/2020 FINAL  Final  SARS Coronavirus 2 by RT PCR (hospital order, performed in [facility] hospital lab) Nasopharyngeal Nasopharyngeal Swab     Status: None   Collection Time: 03/15/20  2:39 PM   Specimen: Nasopharyngeal Swab  Result Value Ref Range Status   SARS Coronavirus 2 NEGATIVE NEGATIVE Final    Comment: (NOTE) SARS-CoV-2 target nucleic acids are NOT DETECTED.  The SARS-CoV-2 RNA is generally detectable in upper and lower respiratory specimens during the acute phase of infection. The  lowest concentration of SARS-CoV-2 viral copies this assay can detect is 250 copies / mL. A negative result does not preclude SARS-CoV-2 infection and should not be used as the sole basis for treatment or other patient management decisions.  A negative result may occur with improper specimen collection / handling, submission of specimen other than nasopharyngeal swab, presence of viral mutation(s) within the areas targeted by this assay, and inadequate number of viral copies (<250 copies / mL). A negative result must be  combined with clinical observations, patient history, and epidemiological information.  Fact Sheet for Patients:   StrictlyIdeas.no  Fact Sheet for Healthcare Providers: BankingDealers.co.za  This test is not yet approved or  cleared by the [place] FDA and has been authorized for detection and/or diagnosis of SARS-CoV-2 by FDA under an Emergency Use Authorization (EUA).  This EUA will remain in effect (meaning this test can be used) for the duration of the COVID-19 declaration under Section 564(b)(1) of the Act, 21 U.S.C. section 360bbb-3(b)(1), unless the authorization is terminated or revoked sooner.  Performed at [facility], [place] [address] [place], [place] [address]   Urine culture     Status: None   Collection Time: 03/15/20  2:45 PM   Specimen: In/Out Cath Urine  Result Value Ref Range Status   Specimen Description IN/OUT CATH URINE  Final   Special Requests NONE  Final   Culture   Final    NO GROWTH Performed at [facility], [place] [address], [place], [place] [address]    Report Status 03/16/2020 FINAL  Final  C Difficile Quick Screen w PCR reflex     Status: None   Collection Time: 03/16/20  5:43 AM   Specimen: STOOL  Result Value Ref Range Status   C Diff antigen NEGATIVE NEGATIVE Final   C Diff toxin NEGATIVE NEGATIVE Final   C Diff interpretation No C. difficile detected.  Final     Comment: Performed at [facility], [place] [address] [place], [place] [address]  Respiratory Panel by PCR     Status: None   Collection Time: 03/19/20  7:22 PM   Specimen: Nasopharyngeal Swab; Respiratory  Result Value Ref Range Status   Adenovirus NOT DETECTED NOT DETECTED Final   Coronavirus 229E NOT DETECTED NOT DETECTED Final    Comment: (NOTE) The Coronavirus on the Respiratory Panel, DOES NOT test for the novel  Coronavirus (2019 nCoV)    Coronavirus HKU1 NOT DETECTED NOT DETECTED Final   Coronavirus NL63 NOT DETECTED NOT DETECTED Final   Coronavirus OC43 NOT DETECTED NOT DETECTED Final   Metapneumovirus NOT DETECTED NOT DETECTED Final   Rhinovirus / Enterovirus NOT DETECTED NOT DETECTED Final   Influenza A NOT DETECTED NOT DETECTED Final   Influenza B NOT DETECTED NOT DETECTED Final   Parainfluenza Virus 1 NOT DETECTED NOT DETECTED Final   Parainfluenza Virus 2 NOT DETECTED NOT DETECTED Final   Parainfluenza Virus 3 NOT DETECTED NOT DETECTED Final   Parainfluenza Virus 4 NOT DETECTED NOT DETECTED Final   Respiratory Syncytial Virus NOT DETECTED NOT DETECTED Final   Bordetella pertussis NOT DETECTED NOT DETECTED Final   Chlamydophila pneumoniae NOT DETECTED NOT DETECTED Final   Mycoplasma pneumoniae NOT DETECTED NOT DETECTED Final    Comment: Performed at [facility], [address], [place], [place] [address]  MRSA PCR Screening     Status: None   Collection Time: 03/19/20  7:22 PM   Specimen: Nasal Mucosa; Nasopharyngeal  Result Value Ref Range Status   MRSA by PCR NEGATIVE NEGATIVE Final    Comment:        The GeneXpert MRSA Assay (FDA approved for NASAL specimens only), is one component of a comprehensive MRSA colonization surveillance program. It is not intended to diagnose MRSA infection nor to guide or monitor treatment for MRSA infections. Performed at [facility], [place] [address], [place],  [address]       Radiology Studies: DG  CHEST PORT 1 VIEW  Result Date: 03/22/2020 CLINICAL DATA:  hypoxia EXAM: PORTABLE CHEST - 1 VIEW COMPARISON:  03/19/2020 FINDINGS: Improved aeration at the left lung base but some increase in interstitial opacities in the right upper lung. Stable left subclavian dual lead transvenous pacemaker. Heart size upper limits normal for technique. No effusion.  No pneumothorax. Visualized bones unremarkable. IMPRESSION: Worsening right upper lobe interstitial opacities. Electronically Signed   By: Lucrezia Europe M.D.   On: 03/22/2020 13:48     Scheduled Meds: . acyclovir  200 mg Oral BID  . atorvastatin  10 mg Oral q1800  . ciprofloxacin  1 drop Both Eyes Q4H while awake  . finasteride  5 mg Oral Daily  . Gerhardt's butt cream   Topical QID  . melatonin  3 mg Oral QHS  . methylPREDNISolone (SOLU-MEDROL) injection  40 mg Intravenous Q8H  . multivitamin with minerals  1 tablet Oral Daily  . mupirocin ointment  1 application Topical Daily  . omega-3 acid ethyl esters  1 g Oral Daily  . potassium chloride  40 mEq Oral Daily  . rivaroxaban  20 mg Oral Q supper  . sodium bicarbonate  650 mg Oral TID  . sodium chloride flush  3 mL Intravenous Once  . thiamine  100 mg Oral Daily   Continuous Infusions: . piperacillin-tazobactam (ZOSYN)  IV 3.375 g (03/24/20 0525)     LOS: 9 days    Time spent: 25 minutes spent in the coordination of care today.    Jonnie Finner, DO [facility]  If 7PM-7AM, please contact night-coverage [email or web address] 03/24/2020, 7:44 AM

## 2020-03-24 NOTE — Care Management Important Message (Signed)
Important Message  Patient Details  Name: CHAUN UEMURA MRN: [id number] Date of Birth: 07-05-1941   Medicare Important Message Given:  Yes     [name] 03/24/2020, 3:27 PM

## 2020-03-25 ENCOUNTER — Inpatient Hospital Stay (HOSPITAL_COMMUNITY): Payer: Medicare HMO

## 2020-03-25 DIAGNOSIS — R0902 Hypoxemia: Secondary | ICD-10-CM

## 2020-03-25 LAB — CBC WITH DIFFERENTIAL/PLATELET
Abs Immature Granulocytes: 0.27 10*3/uL — ABNORMAL HIGH (ref 0.00–0.07)
Basophils Absolute: 0 10*3/uL (ref 0.0–0.1)
Basophils Relative: 0 %
Eosinophils Absolute: 0 10*3/uL (ref 0.0–0.5)
Eosinophils Relative: 0 %
HCT: 31.7 % — ABNORMAL LOW (ref 39.0–52.0)
Hemoglobin: 10.9 g/dL — ABNORMAL LOW (ref 13.0–17.0)
Immature Granulocytes: 2 %
Lymphocytes Relative: 11 %
Lymphs Abs: 1.6 10*3/uL (ref 0.7–4.0)
MCH: 33.7 pg (ref 26.0–34.0)
MCHC: 34.4 g/dL (ref 30.0–36.0)
MCV: 98.1 fL (ref 80.0–100.0)
Monocytes Absolute: 1.1 10*3/uL — ABNORMAL HIGH (ref 0.1–1.0)
Monocytes Relative: 7 %
Neutro Abs: 12.3 10*3/uL — ABNORMAL HIGH (ref 1.7–7.7)
Neutrophils Relative %: 80 %
Platelets: 523 10*3/uL — ABNORMAL HIGH (ref 150–400)
RBC: 3.23 MIL/uL — ABNORMAL LOW (ref 4.22–5.81)
RDW: 13.2 % (ref 11.5–15.5)
WBC: 15.3 10*3/uL — ABNORMAL HIGH (ref 4.0–10.5)
nRBC: 0 % (ref 0.0–0.2)

## 2020-03-25 LAB — BASIC METABOLIC PANEL
Anion gap: 11 (ref 5–15)
BUN: 32 mg/dL — ABNORMAL HIGH (ref 8–23)
CO2: 23 mmol/L (ref 22–32)
Calcium: 9.1 mg/dL (ref 8.9–10.3)
Chloride: 109 mmol/L (ref 98–111)
Creatinine, Ser: 0.74 mg/dL (ref 0.61–1.24)
GFR calc Af Amer: >60 mL/min (ref >60)
GFR calc non Af Amer: >60 mL/min (ref >60)
Glucose, Bld: 129 mg/dL — ABNORMAL HIGH (ref 70–99)
Potassium: 4 mmol/L (ref 3.5–5.1)
Sodium: 143 mmol/L (ref 135–145)

## 2020-03-25 LAB — MAGNESIUM: Magnesium: 2.5 mg/dL — ABNORMAL HIGH (ref 1.7–2.4)

## 2020-03-25 NOTE — Progress Notes (Signed)
  Speech Language Pathology Treatment: Dysphagia  Patient Details Name: [name] MRN: [id number] DOB: 21-Aug-1941 Today's Date: 03/25/2020 Time: 2542-7062 SLP Time Calculation (min) (ACUTE ONLY): 20 min  Assessment / Plan / Recommendation Clinical Impression  Pt was seen for dysphagia treatment. He was alert throughout the session but appeared more confused than during the swallow study. 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 Pt tolerated the majority of nectar thick liquids without overt s/sx of aspiration but exhibited coughing following large consecutive swallows of liquids via cup and cueing was needed to reduce intake rate. It is recommended that the current diet be continued with observance of swallowing precautions. Full supervision may be needed to facilitate observance of swallowing precautions if today's behavior is representative of him. SLP will continue to follow pt.    HPI HPI: 80 year old man with complex hx including afib, CAD, CHB s/p PCM, ?afib on AC, Multilpe myeloma on velcade, revlimid, decodron presenting with FTT and worsening dyspnea.  CXR with infiltrates so tx with CAP coverage followed by HCAP coverage, no improvement.  CTA chest showing bilateral small effusions and scattered NSIP-type infitlrates in setting f background emphysema. Per son at bedside patient has been declining pretty rapidly in past 3 weeks.  Patient denies cough, there may be some aspiration type symptoms.      SLP Plan  Continue with current plan of care       Recommendations  Diet recommendations: Dysphagia 3 (mechanical soft);Nectar-thick liquid Liquids provided via: Cup;Straw Medication Administration: Whole meds with puree Supervision: Patient able to self feed Compensations: Slow rate;Small sips/bites Postural Changes and/or Swallow Maneuvers: Seated upright 90 degrees                Oral Care  Recommendations: Oral care BID Follow up Recommendations: Skilled Nursing facility SLP Visit Diagnosis: Dysphagia, oropharyngeal phase (R13.12) Plan: Continue with current plan of care       Ashley Montminy I. Hardin Negus, [place], [place] Office number [phone] Pager [place] 03/25/2020, 1:34 PM

## 2020-03-25 NOTE — Progress Notes (Signed)
PROGRESS NOTE    [name]  XNA:[id number] DOB: 1941-01-26 DOA: 03/15/2020 PCP: [name] Solian, MD   Brief Narrative:  Patient is a 79 year old male with history of multiple myeloma on chemotherapy, coronary artery disease, atrial fibrillation , hypertension who presented with generalized weakness and lower extremity pain.  On presentation he had a low-grade temperature, blood pressure was soft.  Chest x-ray did not show any infiltrate. Lactic acid was slightly elevated.  He was started on broad-spectrum biotics and IV fluids for the suspicion of sepsis.  Hospital course remarkable for confusion, persistent respiratory failure.  PCCM, palliative care was consulted.   Assessment & Plan:   Active Problems:   Multiple myeloma ([place])   Essential hypertension   Atrial tachycardia/fibrillation   PACEMAKER, [employer] dual-chamber   Coronary artery disease-nonobstructive catheter in October 2011   DNR (do not resuscitate) discussion   Sepsis ([place])   Pressure injury of skin   Immunosuppressed due to chemotherapy   Acquired thrombophilia ([place])   Weakness generalized   Frequent falls   Long term (current) use of anticoagulants   Lactic acidosis   Thrombocytopenia (HCC)   Hypoalbuminemia   Hyperbilirubinemia   Demand ischemia (HCC)   Elevated brain natriuretic peptide (BNP) level   Elevated procalcitonin   Hypokalemia   Anemia of chronic disease   Scalp hematoma   Obesity (BMI 30.0-34.9)   Hypoxia   Palliative care by specialist   Sepsis: Suspected on admission.  Unclear source.  Presented with tachypnea, hypotension, elevated lactic acid level.  UA was unremarkable.  Chest x-ray did not show any infiltrates.  No cellulitis changes.  CT abdomen/pelvis with contrast showed diverticulosis without diverticulitis.  Blood cultures negative so far.  Procalcitonin elevated on presentation.  C. difficile was negative.  Covid testing negative.  Antibiotics have been discontinued  now.Developed leucocytosis today.  Acute hypoxic respiratory failure: PCCM also consulted.  CTA chest did not show any PE.  Chest imaging showed opacities in the anterior and posterior right upper lobe suspicion for pulmonary edema/infectious etiology.  Suspicion for aspiration pneumonia ,speech therapy also consulted.  There is also suspicion for drug-induced pneumonitis from chemo versus progressive multiple myeloma.  Currently on dysphagia diet.  No leukocytosis.  He has been started on steroids.  Multiple myeloma: Currently on chemotherapy.  Follows with Dr. Julien [name].  Outpatient follow-up recommended.  Thrombocytopenia/normocytic anemia: Most likely associated with multiple myeloma.  Continue to monitor.  Hypokalemia/hypomagnesemia: Continue to monitor and supplement as necessary.  Generalized weakness: PT recommended skilled nursing facility.  Hypertension: Currently blood pressure soft.  Home antihypertensives on hold.  Paroxysmal A. fib: Continue Xarelto.  Currently rate is well controlled.  Echocardiogram showed ejection fraction 55 to 60%, no regional wall motion abnormality.S/P pacemaker placement  Hyperlipidemia:Continue statin  Conjunctivitis of left eye: On Cipro eyedrops.  Goals of care: Elderly patient with multiple myeloma with persistent hypoxic respiratory failure.  Palliative care closely following.  Currently remains full code.          DVT prophylaxis: Xarelto Code Status: Full Family Communication: None present at the bedside Status is: Inpatient  Remains inpatient appropriate because:Hemodynamically unstable   Dispo: The patient is from: Home              Anticipated d/c is to: SNF              Anticipated d/c date is: 3 days              Patient currently is not  medically stable to d/c.   Consultants: PCCM, palliative care  Procedures: None  Antimicrobials:  Anti-infectives (From admission, onward)   Start     Dose/Rate Route Frequency Ordered  Stop   03/20/20 0800  vancomycin (VANCOREADY) IVPB 750 mg/150 mL  Status:  Discontinued        750 mg 150 mL/hr over 60 Minutes Intravenous Every 12 hours 03/19/20 1905 03/21/20 1337   03/19/20 2000  vancomycin (VANCOREADY) IVPB 1500 mg/300 mL        1,500 mg 150 mL/hr over 120 Minutes Intravenous  Once 03/19/20 1905 03/19/20 2231   03/19/20 2000  piperacillin-tazobactam (ZOSYN) IVPB 3.375 g  Status:  Discontinued        3.375 g 12.5 mL/hr over 240 Minutes Intravenous Every 8 hours 03/19/20 1905 03/24/20 0745   03/19/20 1930  azithromycin (ZITHROMAX) tablet 500 mg        500 mg Oral Daily 03/19/20 1847 03/21/20 1030   03/16/20 0100  vancomycin (VANCOREADY) IVPB 750 mg/150 mL  Status:  Discontinued        750 mg 150 mL/hr over 60 Minutes Intravenous Every 12 hours 03/15/20 1257 03/17/20 0755   03/15/20 2200  ceFEPIme (MAXIPIME) 2 g in sodium chloride 0.9 % 100 mL IVPB  Status:  Discontinued        2 g 200 mL/hr over 30 Minutes Intravenous Every 8 hours 03/15/20 1257 03/19/20 0923   03/15/20 2200  acyclovir (ZOVIRAX) 200 MG capsule 200 mg     Discontinue     200 mg Oral 2 times daily 03/15/20 1558     03/15/20 1300  ceFEPIme (MAXIPIME) 2 g in sodium chloride 0.9 % 100 mL IVPB        2 g 200 mL/hr over 30 Minutes Intravenous  Once 03/15/20 1247 03/15/20 1402   03/15/20 1300  metroNIDAZOLE (FLAGYL) IVPB 500 mg        500 mg 100 mL/hr over 60 Minutes Intravenous  Once 03/15/20 1247 03/15/20 1433   03/15/20 1300  vancomycin (VANCOCIN) IVPB 1000 mg/200 mL premix  Status:  Discontinued        1,000 mg 200 mL/hr over 60 Minutes Intravenous  Once 03/15/20 1247 03/15/20 1254   03/15/20 1300  vancomycin (VANCOREADY) IVPB 2000 mg/400 mL        2,000 mg 200 mL/hr over 120 Minutes Intravenous  Once 03/15/20 1254 03/15/20 1553      Subjective: Patient seen and examined at the bedside this morning.  Was not in any kind of distress, comfortably lying on the bed.  Just had his breakfast.  Denies any  new complaints.  Very hard of hearing.  Alert and awake and answers questions but confused with the date  Objective: Vitals:   03/24/20 2204 03/24/20 2333 03/25/20 0333 03/25/20 0750  BP: (!) 97/56 108/60 (!) 89/62 114/71  Pulse:   70   Resp:   19   Temp: 98.4 F (36.9 C) 99 F (37.2 C) 98.7 F (37.1 C) 98.5 F (36.9 C)  TempSrc:   Oral Oral  SpO2:   94%   Weight:   93.9 kg   Height:        Intake/Output Summary (Last 24 hours) at 03/25/2020 1122 Last data filed at 03/24/2020 1600 Gross per 24 hour  Intake 240 ml  Output --  Net 240 ml   Filed Weights   03/23/20 0344 03/24/20 0339 03/25/20 0333  Weight: 95.3 kg 99.8 kg 93.9 kg    Examination:  General exam: Elderly, deconditioned, debilitated, hard of hearing Respiratory system:few  Scattered rhonchi, lungs otherwise mostly clear.   Cardiovascular system: S1 & S2 heard, RRR. No JVD, murmurs, rubs, gallops or clicks. Gastrointestinal system: Abdomen is nondistended, soft and nontender. No organomegaly or masses felt. Normal bowel sounds heard. Central nervous system: Alert and awake, obeys commands but not oriented Extremities: No edema, no clubbing ,no cyanosis Skin: No rashes, lesions or ulcers,no icterus ,no pallor   Data Reviewed: I have personally reviewed following labs and imaging studies  CBC: Recent Labs  Lab 03/20/20 1216 03/21/20 0348 03/22/20 0343 03/23/20 0837 03/25/20 0459  WBC 10.3 10.1 9.3 10.5 15.3*  NEUTROABS 5.1 5.5 5.2 8.9* 12.3*  HGB 10.6* 10.3* 11.1* 11.6* 10.9*  HCT 30.8* 30.1* 32.7* 33.9* 31.7*  MCV 96.9 95.9 96.5 96.0 98.1  PLT 250 260 340 438* 323*   Basic Metabolic Panel: Recent Labs  Lab 03/21/20 0348 03/22/20 0343 03/23/20 0837 03/24/20 0358 03/25/20 0459  NA 141 143 141 141 143  K 3.9 4.0 4.2 4.0 4.0  CL 114* 110 106 105 109  CO2 17* 21* 22 22 23   GLUCOSE 109* 98 159* 162* 129*  BUN 20 21 26* 33* 32*  CREATININE 0.92 0.89 0.91 0.97 0.74  CALCIUM 8.5* 8.6* 8.8* 9.1 9.1   MG 1.5* 2.0 2.0 2.2 2.5*   GFR: Estimated Creatinine Clearance: 84.8 mL/min (by C-G formula based on SCr of 0.74 mg/dL). Liver Function Tests: Recent Labs  Lab 03/19/20 0247 03/20/20 1216 03/21/20 0348 03/22/20 0343  AST 54* 63* 63* 54*  ALT 33 38 39 36  ALKPHOS 34* 36* 42 43  BILITOT 1.5* 1.6* 1.5* 1.0  PROT 5.0* 5.5* 5.3* 5.9*  ALBUMIN 2.3* 2.6* 2.4* 2.4*   No results for input(s): LIPASE, AMYLASE in the last 168 hours. Recent Labs  Lab 03/18/20 1648  AMMONIA 16   Coagulation Profile: No results for input(s): INR, PROTIME in the last 168 hours. Cardiac Enzymes: No results for input(s): CKTOTAL, CKMB, CKMBINDEX, TROPONINI in the last 168 hours. BNP (last 3 results) No results for input(s): PROBNP in the last 8760 hours. HbA1C: No results for input(s): HGBA1C in the last 72 hours. CBG: No results for input(s): GLUCAP in the last 168 hours. Lipid Profile: No results for input(s): CHOL, HDL, LDLCALC, TRIG, CHOLHDL, LDLDIRECT in the last 72 hours. Thyroid Function Tests: No results for input(s): TSH, T4TOTAL, FREET4, T3FREE, THYROIDAB in the last 72 hours. Anemia Panel: No results for input(s): VITAMINB12, FOLATE, FERRITIN, TIBC, IRON, RETICCTPCT in the last 72 hours. Sepsis Labs: Recent Labs  Lab 03/19/20 1048 03/19/20 1612 03/22/20 1600  PROCALCITON  --   --  0.27  LATICACIDVEN 1.8 1.6  --     Recent Results (from the past 240 hour(s))  Culture, blood (routine x 2)     Status: None   Collection Time: 03/15/20 12:08 PM   Specimen: BLOOD LEFT ARM  Result Value Ref Range Status   Specimen Description BLOOD LEFT ARM  Final   Special Requests   Final    BOTTLES DRAWN AEROBIC AND ANAEROBIC Blood Culture adequate volume   Culture   Final    NO GROWTH 5 DAYS Performed at [facility], [address] [place], [place] [address]    Report Status 03/20/2020 FINAL  Final  Culture, blood (routine x 2)     Status: None   Collection Time: 03/15/20 12:09 PM    Specimen: BLOOD  Result Value Ref Range Status  Specimen Description BLOOD LEFT ANTECUBITAL  Final   Special Requests   Final    BOTTLES DRAWN AEROBIC AND ANAEROBIC Blood Culture adequate volume   Culture   Final    NO GROWTH 5 DAYS Performed at [facility], [address] [place], [place] [address]    Report Status 03/20/2020 FINAL  Final  SARS Coronavirus 2 by RT PCR (hospital order, performed in [facility] hospital lab) Nasopharyngeal Nasopharyngeal Swab     Status: None   Collection Time: 03/15/20  2:39 PM   Specimen: Nasopharyngeal Swab  Result Value Ref Range Status   SARS Coronavirus 2 NEGATIVE NEGATIVE Final    Comment: (NOTE) SARS-CoV-2 target nucleic acids are NOT DETECTED.  The SARS-CoV-2 RNA is generally detectable in upper and lower respiratory specimens during the acute phase of infection. The lowest concentration of SARS-CoV-2 viral copies this assay can detect is 250 copies / mL. A negative result does not preclude SARS-CoV-2 infection and should not be used as the sole basis for treatment or other patient management decisions.  A negative result may occur with improper specimen collection / handling, submission of specimen other than nasopharyngeal swab, presence of viral mutation(s) within the areas targeted by this assay, and inadequate number of viral copies (<250 copies / mL). A negative result must be combined with clinical observations, patient history, and epidemiological information.  Fact Sheet for Patients:   StrictlyIdeas.no  Fact Sheet for Healthcare Providers: BankingDealers.co.za  This test is not yet approved or  cleared by the [place] FDA and has been authorized for detection and/or diagnosis of SARS-CoV-2 by FDA under an Emergency Use Authorization (EUA).  This EUA will remain in effect (meaning this test can be used) for the duration of the COVID-19 declaration under Section  564(b)(1) of the Act, 21 U.S.C. section 360bbb-3(b)(1), unless the authorization is terminated or revoked sooner.  Performed at [facility], [place] [address] [place], [place] [address]   Urine culture     Status: None   Collection Time: 03/15/20  2:45 PM   Specimen: In/Out Cath Urine  Result Value Ref Range Status   Specimen Description IN/OUT CATH URINE  Final   Special Requests NONE  Final   Culture   Final    NO GROWTH Performed at [facility], [place] [address], [place], [place] [address]    Report Status 03/16/2020 FINAL  Final  C Difficile Quick Screen w PCR reflex     Status: None   Collection Time: 03/16/20  5:43 AM   Specimen: STOOL  Result Value Ref Range Status   C Diff antigen NEGATIVE NEGATIVE Final   C Diff toxin NEGATIVE NEGATIVE Final   C Diff interpretation No C. difficile detected.  Final    Comment: Performed at [facility], [place] [address], [place],  [address]  Respiratory Panel by PCR     Status: None   Collection Time: 03/19/20  7:22 PM   Specimen: Nasopharyngeal Swab; Respiratory  Result Value Ref Range Status   Adenovirus NOT DETECTED NOT DETECTED Final   Coronavirus 229E NOT DETECTED NOT DETECTED Final    Comment: (NOTE) The Coronavirus on the Respiratory Panel, DOES NOT test for the novel  Coronavirus (2019 nCoV)    Coronavirus HKU1 NOT DETECTED NOT DETECTED Final   Coronavirus NL63 NOT DETECTED NOT DETECTED Final   Coronavirus OC43 NOT DETECTED NOT DETECTED Final   Metapneumovirus NOT DETECTED NOT DETECTED Final  Rhinovirus / Enterovirus NOT DETECTED NOT DETECTED Final   Influenza A NOT DETECTED NOT DETECTED Final   Influenza B NOT DETECTED NOT DETECTED Final   Parainfluenza Virus 1 NOT DETECTED NOT DETECTED Final   Parainfluenza Virus 2 NOT DETECTED NOT DETECTED Final   Parainfluenza Virus 3 NOT DETECTED NOT DETECTED Final   Parainfluenza Virus 4 NOT DETECTED NOT DETECTED Final   Respiratory Syncytial Virus NOT  DETECTED NOT DETECTED Final   Bordetella pertussis NOT DETECTED NOT DETECTED Final   Chlamydophila pneumoniae NOT DETECTED NOT DETECTED Final   Mycoplasma pneumoniae NOT DETECTED NOT DETECTED Final    Comment: Performed at [facility], [place] [address] [place], [place] [address]  MRSA PCR Screening     Status: None   Collection Time: 03/19/20  7:22 PM   Specimen: Nasal Mucosa; Nasopharyngeal  Result Value Ref Range Status   MRSA by PCR NEGATIVE NEGATIVE Final    Comment:        The GeneXpert MRSA Assay (FDA approved for NASAL specimens only), is one component of a comprehensive MRSA colonization surveillance program. It is not intended to diagnose MRSA infection nor to guide or monitor treatment for MRSA infections. Performed at [facility], [place] [address], [place], [place] [address]          Radiology Studies: DG CHEST PORT 1 VIEW  Result Date: 03/25/2020 CLINICAL DATA:  Respiratory failure EXAM: PORTABLE CHEST 1 VIEW COMPARISON:  03/23/2011 FINDINGS: Left pacer remains in place with leads in the right atrium and right ventricle. Cardiomegaly, vascular congestion. No confluent opacities or effusions. No acute bony abnormality. IMPRESSION: Cardiomegaly, vascular congestion. Electronically Signed   By: Rolm Baptise M.D.   On: 03/25/2020 08:13   DG Swallowing Func-Speech Pathology  Result Date: 03/24/2020 Objective Swallowing Evaluation: Type of Study: Bedside Swallow Evaluation  Patient Details Name: [name] MRN: [id number] Date of Birth: 1941/07/27 Today's Date: 03/24/2020 Time: SLP Start Time (ACUTE ONLY): 1035 -SLP Stop Time (ACUTE ONLY): 1059 SLP Time Calculation (min) (ACUTE ONLY): 24 min Past Medical History: Past Medical History: Diagnosis Date . Atrial flutter ([place])   A. s/p prior ablation;  B.  s/p CTI ablation 10/24/10 (Dr. Caryl Comes) . CAD (coronary artery disease)   A.  s/p CFX in the past;   B.  cath 06/2010: LAD 30-40%, D1 50%, OM1 50%, AVCFX stent 30%,  dCFX 70-80% (med Rx), mRCA 40%;      C.  Echo 06/2010: EF 60-65%, mod LVH; mild LAE    . Complete heart block (HCC)   a. s/p STJ pacemaker . Cubital tunnel syndrome  . Degenerative disc disease  . Heart block  . HOH (hard of hearing)  . Hx of adenomatous colonic polyps  . Hyperlipidemia  . Hypertension  . Multiple myeloma  . Persistent atrial fibrillation ([place])  . Sleep apnea  . Venous insufficiency  Past Surgical History: Past Surgical History: Procedure Laterality Date . CARDIOVERSION N/A 04/20/2018  Procedure: CARDIOVERSION;  Surgeon: Skeet Latch, MD;  Location: [facility];  Service: Cardiovascular;  Laterality: N/A; . COLONOSCOPY  10-07-2009  TA polyp, tics, hems  . CORONARY ANGIOPLASTY WITH STENT PLACEMENT  2005 . PACEMAKER PLACEMENT  2011 . POLYPECTOMY   . PPM GENERATOR CHANGEOUT N/A 09/27/2019  Procedure: PPM GENERATOR CHANGEOUT;  Surgeon: Deboraha Sprang, MD;  Location: [facility];  Service: Cardiovascular;  Laterality: N/A; . Right olecranon nerve surgery   . VENTRAL HERNIA REPAIR   HPI: 79 year  old man with complex hx including afib, CAD, CHB s/p PCM, ?afib on AC, Multilpe myeloma on velcade, revlimid, decodron presenting with FTT and worsening dyspnea.  CXR with infiltrates so tx with CAP coverage followed by HCAP coverage, no improvement.  CTA chest showing bilateral small effusions and scattered NSIP-type infitlrates in setting f background emphysema. Per son at bedside patient has been declining pretty rapidly in past 3 weeks.  Patient denies cough, there may be some aspiration type symptoms.  No data recorded Assessment / Plan / Recommendation CHL IP CLINICAL IMPRESSIONS 03/24/2020 Clinical Impression Pt presents with pharyngeal dysphagia characterized by reduced lingual retraction, reduced anterior laryngeal movement, and inconistently incomplete epiglottic inversion. He demonstrated vallecular residue and penetration (PAS 3,5) of thin liquids with ultimate silent aspiration (PAS 8).  Frequency of penetration was reduced with reduced bolus sizes but it was not eliminated. Pt exhibited difficulty with A-P transport of the barium tablet with nectar thick liquids, but less difficulty was noted when it was adminsitered with puree and the oral phase of his swallow was [facility]. A dysphagia 3 diet with nectar thick liquids is recommended at this time. SLP will follow for dysphagia treatment.   SLP Visit Diagnosis Dysphagia, oropharyngeal phase (R13.12) Attention and concentration deficit following -- Frontal lobe and executive function deficit following -- Impact on safety and function Moderate aspiration risk   CHL IP TREATMENT RECOMMENDATION 03/24/2020 Treatment Recommendations Therapy as outlined in treatment plan below   Prognosis 03/24/2020 Prognosis for Safe Diet Advancement Good Barriers to Reach Goals -- Barriers/Prognosis Comment -- CHL IP DIET RECOMMENDATION 03/24/2020 SLP Diet Recommendations Dysphagia 3 (Mech soft) solids;Nectar thick liquid Liquid Administration via Cup;Straw Medication Administration Whole meds with puree Compensations Slow rate;Small sips/bites Postural Changes Seated upright at 90 degrees   CHL IP OTHER RECOMMENDATIONS 03/24/2020 Recommended Consults -- Oral Care Recommendations Oral care BID Other Recommendations --   CHL IP FOLLOW UP RECOMMENDATIONS 03/24/2020 Follow up Recommendations Skilled Nursing facility   [facility] IP FREQUENCY AND DURATION 03/24/2020 Speech Therapy Frequency (ACUTE ONLY) min 2x/week Treatment Duration 2 weeks      CHL IP ORAL PHASE 03/24/2020 Oral Phase WFL Oral - Pudding Teaspoon -- Oral - Pudding Cup -- Oral - Honey Teaspoon -- Oral - Honey Cup -- Oral - Nectar Teaspoon -- Oral - Nectar Cup -- Oral - Nectar Straw -- Oral - Thin Teaspoon -- Oral - Thin Cup -- Oral - Thin Straw -- Oral - Puree -- Oral - Mech Soft -- Oral - Regular -- Oral - Multi-Consistency -- Oral - Pill -- Oral Phase - Comment --  CHL IP PHARYNGEAL PHASE 03/24/2020 Pharyngeal Phase Impaired  Pharyngeal- Pudding Teaspoon -- Pharyngeal -- Pharyngeal- Pudding Cup -- Pharyngeal -- Pharyngeal- Honey Teaspoon -- Pharyngeal -- Pharyngeal- Honey Cup -- Pharyngeal -- Pharyngeal- Nectar Teaspoon -- Pharyngeal -- Pharyngeal- Nectar Cup Reduced anterior laryngeal mobility;Pharyngeal residue - valleculae;Reduced tongue base retraction Pharyngeal -- Pharyngeal- Nectar Straw Reduced anterior laryngeal mobility;Pharyngeal residue - valleculae;Reduced tongue base retraction Pharyngeal -- Pharyngeal- Thin Teaspoon -- Pharyngeal -- Pharyngeal- Thin Cup Reduced anterior laryngeal mobility;Pharyngeal residue - valleculae;Penetration/Aspiration during swallow;Penetration/Apiration after swallow;Reduced tongue base retraction Pharyngeal Material enters airway, remains ABOVE vocal cords and not ejected out;Material enters airway, CONTACTS cords and not ejected out;Material enters airway, passes BELOW cords without attempt by patient to eject out (silent aspiration) Pharyngeal- Thin Straw Reduced anterior laryngeal mobility;Pharyngeal residue - valleculae;Penetration/Aspiration during swallow;Penetration/Apiration after swallow;Reduced tongue base retraction Pharyngeal Material enters airway, remains ABOVE vocal cords and not ejected out;Material  enters airway, CONTACTS cords and not ejected out;Material enters airway, passes BELOW cords without attempt by patient to eject out (silent aspiration) Pharyngeal- Puree Reduced anterior laryngeal mobility;Pharyngeal residue - valleculae;Reduced tongue base retraction Pharyngeal -- Pharyngeal- Mechanical Soft -- Pharyngeal -- Pharyngeal- Regular -- Pharyngeal -- Pharyngeal- Multi-consistency Reduced anterior laryngeal mobility;Pharyngeal residue - valleculae;Reduced tongue base retraction Pharyngeal -- Pharyngeal- Pill Reduced anterior laryngeal mobility;Pharyngeal residue - valleculae;Reduced tongue base retraction Pharyngeal -- Pharyngeal Comment --  CHL IP CERVICAL ESOPHAGEAL PHASE  03/24/2020 Cervical Esophageal Phase WFL Pudding Teaspoon -- Pudding Cup -- Honey Teaspoon -- Honey Cup -- Nectar Teaspoon -- Nectar Cup -- Nectar Straw -- Thin Teaspoon -- Thin Cup -- Thin Straw -- Puree -- Mechanical Soft -- Regular -- Multi-consistency -- Pill -- Cervical Esophageal Comment -- Shanika I. Hardin Negus, [place], [place] Office number [phone] Pager [place] 03/24/2020, 1:51 PM                   Scheduled Meds: . acyclovir  200 mg Oral BID  . atorvastatin  10 mg Oral q1800  . ciprofloxacin  1 drop Both Eyes Q4H while awake  . finasteride  5 mg Oral Daily  . Gerhardt's butt cream   Topical QID  . melatonin  3 mg Oral QHS  . multivitamin with minerals  1 tablet Oral Daily  . mupirocin ointment  1 application Topical Daily  . omega-3 acid ethyl esters  1 g Oral Daily  . potassium chloride  40 mEq Oral Daily  . predniSONE  40 mg Oral Q breakfast   Followed by  . [START ON 03/30/2020] predniSONE  20 mg Oral Q breakfast   Followed by  . [START ON 04/04/2020] predniSONE  10 mg Oral Q breakfast  . rivaroxaban  20 mg Oral Q supper  . sodium chloride flush  3 mL Intravenous Once  . thiamine  100 mg Oral Daily   Continuous Infusions:   LOS: 10 days    Time spent: 25 mins.More than 50% of that time was spent in counseling and/or coordination of care.      Shelly Coss, MD [facility] P7/14/2021, 11:22 AM

## 2020-03-25 NOTE — Progress Notes (Signed)
Physical Therapy Treatment Patient Details Name: [name] MRN: [id number] DOB: 10/18/1940 Today's Date: 03/25/2020    History of Present Illness 79 y.o. male with medical history significant of multiple myeloma on chemotherapy, CAD, A. fib, hypertension presented with generalized weakness and increasing leg pain. Presents to ED after 2 falls in the last 2 days and c/o of chronic diarrhea. Admitted 03/15/20 for treatment of sepsis and weakness.    PT Comments    Pt agreeable to therapy this afternoon, however when brought Stedy into room pt refused to try lift equipment and can not verbalize what happened to make him adverse to utilizing it for movement. Pt limited in safe mobility by decreased cognition in presence of decreased strength, and balance. Pt is min guard for coming to the EoB, once there pt feels he is having a bowel movement and brings his LE back into bed requesting bed pan. Pt is min A for rolling onto bed pan. Once finished and cleaned up pt agreeable to getting back up to EoB, where he appreciated ability to move his LE and bend forward touching his toes with min guard for safety. Pt then agreeable to try standing and taking lateral steps towards the HoB. Pt with 3 x sit>stand, 1st 2 attempts required maxAx2. With increased cuing pt able to come to fully upright on 3rd attempt with modA and take 3 steps to HoB. D/c plans remain appropriate. PT will continue to follow acutely.     Follow Up Recommendations  SNF     Equipment Recommendations  Other (comment) (TBD at next venue)       Precautions / Restrictions Precautions Precautions: Fall Precaution Comments: fall prior to hospitalization  Restrictions Weight Bearing Restrictions: No    Mobility  Bed Mobility Overal bed mobility: Needs Assistance Bed Mobility: Rolling;Sidelying to Sit;Sit to Supine Rolling: Min assist Sidelying to sit: Min guard Supine to sit: Min guard;HOB elevated Sit to supine: Min guard;HOB  elevated   General bed mobility comments: minA for rolling- guiding hands to rails; minguardA to EOB.  Transfers Overall transfer level: Needs assistance Equipment used: 2 person hand held assist Transfers: Sit to/from Stand Sit to Stand: Max assist;+2 physical assistance;Mod assist         General transfer comment: MaxA +2 for power-upx2 times with inability to extend knees; 3rd time standing, pt modA +2 taking 3 steps to [facility].        Balance Overall balance assessment: Needs assistance Sitting-balance support: Feet supported;Feet unsupported;No upper extremity supported;Bilateral upper extremity supported;Single extremity supported Sitting balance-Leahy Scale: Fair Sitting balance - Comments: supervisionA to minguardA at EOB due to slight dizziness.   Standing balance support: Bilateral upper extremity supported;During functional activity Standing balance-Leahy Scale: Poor Standing balance comment: Pt required b/l knees blocked, frontal maxA +2 to modA +2 for task.                            Cognition Arousal/Alertness: Awake/alert Behavior During Therapy: WFL for tasks assessed/performed Overall Cognitive Status: Impaired/Different from baseline Area of Impairment: Following commands;Awareness;Problem solving                       Following Commands: Follows one step commands consistently;Follows multi-step commands with increased time   Awareness: Emergent Problem Solving: Slow processing;Difficulty sequencing;Requires verbal cues;Requires tactile cues General Comments: Pt requiring assist for problem solving through a new task; pt requiring assist for sequencing. Pt also  HOH and this may be a result of inability to hear directions properly.         General Comments General comments (skin integrity, edema, etc.): VSS. Pt BP at EOB 109/70, 70 BPM, >93% on RA.      Pertinent Vitals/Pain Pain Assessment: Faces Faces Pain Scale: Hurts a little  bit Pain Location: generalized Pain Descriptors / Indicators: Aching;Sore Pain Intervention(s): Limited activity within patient's tolerance;Monitored during session;Repositioned           PT Goals (current goals can now be found in the care plan section) Acute Rehab PT Goals Patient Stated Goal: have less pain PT Goal Formulation: With patient Time For Goal Achievement: 03/30/20 Potential to Achieve Goals: Fair Progress towards PT goals: Progressing toward goals    Frequency    Min 2X/week      PT Plan Current plan remains appropriate    Co-evaluation PT/OT/SLP Co-Evaluation/Treatment: Yes Reason for Co-Treatment: For patient/therapist safety;To address functional/ADL transfers PT goals addressed during session: Mobility/safety with mobility        AM-PAC PT "6 Clicks" Mobility   Outcome Measure  Help needed turning from your back to your side while in a flat bed without using bedrails?: A Lot Help needed moving from lying on your back to sitting on the side of a flat bed without using bedrails?: A Little Help needed moving to and from a bed to a chair (including a wheelchair)?: Total Help needed standing up from a chair using your arms (e.g., wheelchair or bedside chair)?: Total Help needed to walk in hospital room?: Total Help needed climbing 3-5 steps with a railing? : Total 6 Click Score: 9    End of Session Equipment Utilized During Treatment: Gait belt;Oxygen Activity Tolerance: Patient tolerated treatment well Patient left: in bed;with call bell/phone within reach;with bed alarm set Nurse Communication: Mobility status PT Visit Diagnosis: Unsteadiness on feet (R26.81);Other abnormalities of gait and mobility (R26.89);Muscle weakness (generalized) (M62.81);Difficulty in walking, not elsewhere classified (R26.2)     Time: 1959-7471 PT Time Calculation (min) (ACUTE ONLY): 53 min  Charges:  $Therapeutic Activity: 23-37 mins                     Eloise Mula  B. Migdalia Dk PT, DPT Acute Rehabilitation Services Pager [phone] Office [phone]    [place] 03/25/2020, 4:33 PM

## 2020-03-25 NOTE — Progress Notes (Signed)
Occupational Therapy Treatment Patient Details Name: [name] MRN: [id number] DOB: 1940-11-12 Today's Date: 03/25/2020    History of present illness 79 y.o. male with medical history significant of multiple myeloma on chemotherapy, CAD, A. fib, hypertension presented with generalized weakness and increasing leg pain. Presents to ED after 2 falls in the last 2 days and c/o of chronic diarrhea. Admitted 03/15/20 for treatment of sepsis and weakness.   OT comments  Pt limited by decreased strength, decreased ability to care for self and decreased ability to mobilize due to pain. Pt having BM at EOB and quickly returned to supine to sit on bed pan.  Pt rolling side to side for pericare. Pt performing sit to stands and taking 3 steps to [facility] with modA +2 to maxA+2. Pt with questionable cognitive deficits and requires multimodal cues to complete tasks. Pt appears very motivated to return to PLOF. VSS. Pt BP at EOB 109/70, 70 BPM, >93% on RA. OT continuing to follow per POC.   Follow Up Recommendations  SNF;Supervision/Assistance - 24 hour    Equipment Recommendations  Other (comment) (defer to next facility)    Recommendations for Other Services      Precautions / Restrictions Precautions Precautions: Fall Precaution Comments: fall prior to hospitalization  Restrictions Weight Bearing Restrictions: No       Mobility Bed Mobility Overal bed mobility: Needs Assistance Bed Mobility: Rolling;Sidelying to Sit;Sit to Supine Rolling: Min assist Sidelying to sit: Min guard Supine to sit: Min guard;HOB elevated Sit to supine: Min guard;HOB elevated   General bed mobility comments: minA for rolling- guiding hands to rails; minguardA to EOB.  Transfers Overall transfer level: Needs assistance Equipment used: 2 person hand held assist Transfers: Sit to/from Stand Sit to Stand: Max assist;+2 physical assistance;Mod assist         General transfer comment: MaxA +2 for power-upx2 times  with inability to extend knees; 3rd time standing, pt modA +2 taking 3 steps to [facility].    Balance Overall balance assessment: Needs assistance Sitting-balance support: Feet supported;Feet unsupported;No upper extremity supported;Bilateral upper extremity supported;Single extremity supported Sitting balance-Leahy Scale: Fair Sitting balance - Comments: supervisionA to minguardA at EOB due to slight dizziness.   Standing balance support: Bilateral upper extremity supported;During functional activity Standing balance-Leahy Scale: Poor Standing balance comment: Pt required b/l knees blocked, frontal maxA +2 to modA +2 for task.                           ADL either performed or assessed with clinical judgement   ADL Overall ADL's : Needs assistance/impaired     Grooming: Wash/dry hands;Wash/dry face;Oral care;Sitting;Set up Grooming Details (indicate cue type and reason): set-upA at [place] and Hygiene: Total assistance;Bed level Toileting - Clothing Manipulation Details (indicate cue type and reason): rolling side to side for task completion     Functional mobility during ADLs: Maximal assistance;+2 for physical assistance;+2 for safety/equipment;Cueing for safety;Cueing for sequencing General ADL Comments: Pt having BM at EOB and quickly returned to supine to sit on bed pan.  Pt rolling side to side for pericare. Pt limited by decreased strength, decreased ability to care for self  and decreased ability to mobilize due to pain.      Vision   Vision Assessment?: No apparent visual deficits Additional Comments: wears glasses  Perception     Praxis      Cognition Arousal/Alertness: Awake/alert Behavior During Therapy: WFL for tasks assessed/performed Overall Cognitive Status: Impaired/Different from baseline Area of Impairment: Following commands;Awareness;Problem solving                        Following Commands: Follows one step commands consistently;Follows multi-step commands with increased time   Awareness: Emergent Problem Solving: Slow processing;Difficulty sequencing;Requires verbal cues;Requires tactile cues General Comments: Pt requiring assist for problem solving through a new task; pt requiring assist for sequencing. Pt also HOH and this may be a result of inability to hear directions properly.        Exercises     Shoulder Instructions       General Comments VSS. Pt BP at EOB 109/70, 70 BPM, >93% on RA.    Pertinent Vitals/ Pain       Pain Assessment: Faces Faces Pain Scale: Hurts a little bit Pain Location: generalized Pain Descriptors / Indicators: Aching;Sore Pain Intervention(s): Monitored during session;Repositioned  Home Living                                          Prior Functioning/Environment              Frequency  Min 2X/week        Progress Toward Goals  OT Goals(current goals can now be found in the care plan section)  Progress towards OT goals: Progressing toward goals  Acute Rehab OT Goals Patient Stated Goal: have less pain OT Goal Formulation: With patient Time For Goal Achievement: 03/31/20 Potential to Achieve Goals: Good ADL Goals Pt Will Perform Grooming: with min assist;sitting Pt Will Perform Upper Body Dressing: with min assist;sitting Pt Will Transfer to Toilet: with max assist;squat pivot transfer;bedside commode Pt/caregiver will Perform Home Exercise Program: Increased strength;Both right and left upper extremity Additional ADL Goal #1: Pt will increase to modA for bed mobility as precursor for ADL functional mobility.  Plan Discharge plan remains appropriate    Co-evaluation                 AM-PAC OT "6 Clicks" Daily Activity     Outcome Measure   Help from another person eating meals?: A Lot Help from another person taking care of personal grooming?: A Lot Help from  another person toileting, which includes using toliet, bedpan, or urinal?: Total Help from another person bathing (including washing, rinsing, drying)?: Total Help from another person to put on and taking off regular upper body clothing?: A Lot Help from another person to put on and taking off regular lower body clothing?: Total 6 Click Score: 9    End of Session Equipment Utilized During Treatment: Gait belt;Oxygen  OT Visit Diagnosis: Unsteadiness on feet (R26.81);Muscle weakness (generalized) (M62.81)   Activity Tolerance Patient tolerated treatment well;Patient limited by pain   Patient Left in bed;with call bell/phone within reach   Nurse Communication Mobility status        Time: 8938-1017 OT Time Calculation (min): 53 min  Charges: OT General Charges $OT Visit: 1 Visit OT Treatments $Self Care/Home Management : 23-37 mins  Jefferey Pica, OTR/L Acute Rehabilitation Services Pager: [phone] Office: [phone]    Georgeanna Radziewicz C 03/25/2020, 3:14 PM

## 2020-03-25 NOTE — Progress Notes (Signed)
NAME:  [name], MRN:  [id number], DOB:  May 28, 1941, LOS: 53 ADMISSION DATE:  03/15/2020, CONSULTATION DATE:  7/11 REFERRING MD:  Marylyn Ishihara, CHIEF COMPLAINT:  Abnormal cxr   Brief History   79 year old male w/ multiple myeloma (on chemo->last rx 6/22 see below). Admitted initially w/ working dx of sepsis source not clear. PCCM asked to admit as in spite of 8 days abx and gentle diuresis still having hypoxia and abnormal CXR   History of present illness   79 year old male patient with history as mentioned below presented on 7/4 with chief complaint of increasing leg pain low-grade fever and weakness.  In the ER was found to be hypotensive, chest x-ray initially without significant infiltrates, was admitted with diagnosis of sepsis of unclear etiology  and placed on empiric antibiotics in addition to IV fluids cultures were obtained.  Initial procalcitonin slightly elevated at 2.19.  Was treated with IV empiric antibiotics, improved clinically however continued to be hypoxic a CT of chest was obtained which demonstrated bilateral effusions scattered areas of fibrotic infiltrates on the background of chronic emphysema changes.  IV antibiotics were continued, he was challenged with IV Lasix. Further evaluation of lab work demonstrates ongoing elevated eosinophils, respiratory viral panel sent this was negative, today he is essentially euvolemic according to his intake output balance a follow-up chest x-ray was obtained on 7/1 as patient was still oxygen dependent demonstrating increasing upper lobe opacities and because of this pulmonary asked to evaluate  Past Medical History  Multiple myeloma on chemotherapy n status post 3 cycles of Velcade, Revlimid, and Decadron, last treatment 6/29, coronary artery disease, atrial fibrillation, hypertension, internal pacemaker, chronic anticoagulation , anemia of chronic disease.  Significant Hospital Events   7/4 admitted  7/11 pccm consulted for pulm infiltrates  and new oxygen need in spite of abx and diuretics.   Consults:  PCCM   Procedures:    Significant Diagnostic Tests:  CT chest 7/8: 1. No pulmonary embolus allowing for motion artifact.2. Multi chamber cardiomegaly with small pericardial effusion.Contrast refluxing into the hepatic veins and IVC consistent with elevated right heart pressures.3. Small to moderate bilateral pleural effusions with compressive atelectasis. Mild pulmonary edema.4. Patchy ground-glass opacities in the anterior and posterior right upper lobe may be pulmonary edema or infectious, including atypical viral organisms.5. Shotty mediastinal and hilar nodes are likely reactive.  Micro Data:  UC 7/5: neg RVP neg BC 7/9: neg  Antimicrobials:  Cefepime 7/4>>>7/7 Zosyn 7/8>>>7/12 Vanco 7/4>>7/6;  Restart 7/8>>7/10  Interim history/subjective:  No acute distress at rest Objective   Blood pressure 114/71, pulse 70, temperature 98.5 F (36.9 C), temperature source Oral, resp. rate 19, height 5' 10"  (1.778 m), weight 93.9 kg, SpO2 94 %.        Intake/Output Summary (Last 24 hours) at 03/25/2020 1004 Last data filed at 03/24/2020 1600 Gross per 24 hour  Intake 240 ml  Output --  Net 240 ml   Filed Weights   03/23/20 0344 03/24/20 0339 03/25/20 0333  Weight: 95.3 kg 99.8 kg 93.9 kg    Examination: General elderly male no acute distress sitting up eating No JVD or lymphadenopathy is appreciated Decreased breath sounds throughout Heart sounds are distant Muscle wasting noted Neurologically awake alert follows commands   Resolved Hospital Problem list     Assessment & Plan:  Acute hypoxic respiratory failure  Pulmonary infiltrates of unclear etiology  Multiple myeloma History of atrial fibrillation on anticoagulation History coronary artery disease Sepsis in the  setting of immunocompromising drugs etiology unclear History of hypertension Generalized weakness Thrombocytopenia    Acute hypoxic  respiratory failure in setting of pulmonary infiltrates of unclear etiology ( Infection vs volume overload vs drug induced pneumonitis from chemo vs progressive MM) ABX course has been completed Afebrile He is a poor candidate for bronchoscopy given debilitated state, also on long-term anticoagulation Plan Titrate oxygen to keep sats greater than 90%  Diuresis currently on hold  change to a dysphagia 3 diet Continue pulmonary toilet Pulmonary critical care available as needed    Discussion Palliative care is following with further discussions plan for near future.  Chest x-ray does not show any significant change.     Richardson Landry Eagan Shifflett ACNP Acute Care Nurse Practitioner [place] Please consult Amion 03/25/2020, 10:04 AM

## 2020-03-26 DIAGNOSIS — Z7189 Other specified counseling: Secondary | ICD-10-CM

## 2020-03-26 LAB — CBC WITH DIFFERENTIAL/PLATELET
Abs Immature Granulocytes: 0.28 10*3/uL — ABNORMAL HIGH (ref 0.00–0.07)
Basophils Absolute: 0 10*3/uL (ref 0.0–0.1)
Basophils Relative: 0 %
Eosinophils Absolute: 0 10*3/uL (ref 0.0–0.5)
Eosinophils Relative: 0 %
HCT: 32.8 % — ABNORMAL LOW (ref 39.0–52.0)
Hemoglobin: 11.1 g/dL — ABNORMAL LOW (ref 13.0–17.0)
Immature Granulocytes: 2 %
Lymphocytes Relative: 23 %
Lymphs Abs: 2.8 10*3/uL (ref 0.7–4.0)
MCH: 33.5 pg (ref 26.0–34.0)
MCHC: 33.8 g/dL (ref 30.0–36.0)
MCV: 99.1 fL (ref 80.0–100.0)
Monocytes Absolute: 1.3 10*3/uL — ABNORMAL HIGH (ref 0.1–1.0)
Monocytes Relative: 11 %
Neutro Abs: 7.5 10*3/uL (ref 1.7–7.7)
Neutrophils Relative %: 64 %
Platelets: 461 10*3/uL — ABNORMAL HIGH (ref 150–400)
RBC: 3.31 MIL/uL — ABNORMAL LOW (ref 4.22–5.81)
RDW: 13.1 % (ref 11.5–15.5)
WBC: 11.9 10*3/uL — ABNORMAL HIGH (ref 4.0–10.5)
nRBC: 0 % (ref 0.0–0.2)

## 2020-03-26 LAB — IGE: IgE (Immunoglobulin E), Serum: 26 IU/mL (ref 6–495)

## 2020-03-26 NOTE — Progress Notes (Signed)
Daily Progress Note   Patient Name: [name]       Date: 03/26/2020 DOB: 1941-08-28  Age: 79 y.o. MRN#: [id number] Attending Physician: Shelly Coss, MD Primary Care Physician: [name], MD Admit Date: 03/15/2020  Reason for Consultation/Follow-up: Establishing goals of care   Subjective: Met with patient, his son, and daughter. Patient was oriented and able to participate in [place] discussion.  Illness trajectory and comordities were discussed. EOL wishes, goals of care and advanced care planning discussed. MOST form completed.  Functional and nutritional status reviewed- prior to admission [name] had limited mobility, incontinent at times. Was spending most of his time in his chair, sleeping in his chair due to sleep apnea. Lives with his ex-spouse- she is unable to assist in his care due to her own health issues.His children live close by. Prior to starting his chemotherapy he was much more active and mobile- however, the chemotherapy affected him dramatically.  [name] expresses that spending as much time with his family and his granddaughter are what he values the most.  He is aware that he needs more assistance being cared for. He would prefer to live out his days being cared for in a nursing facility. His preference would be to go to a SNF and undergo rehab and then transition to long term care if he is unable to get to a point where he can discharge home.   MOST completed with following choices:  1. DNR 2. Limited Medical Interventions 3. Antibiotics if indicated 4. IV fluids if indicated 5. Feeding tube limited trial in needed  Review of Systems  Constitutional: Positive for malaise/fatigue.  Neurological: Positive for weakness.    Length of Stay:  11  Current Medications: Scheduled Meds:  . acyclovir  200 mg Oral BID  . atorvastatin  10 mg Oral q1800  . ciprofloxacin  1 drop Both Eyes Q4H while awake  . finasteride  5 mg Oral Daily  . Gerhardt's butt cream   Topical QID  . melatonin  3 mg Oral QHS  . multivitamin with minerals  1 tablet Oral Daily  . mupirocin ointment  1 application Topical Daily  . omega-3 acid ethyl esters  1 g Oral Daily  . potassium chloride  40 mEq Oral Daily  . predniSONE  40 mg Oral Q  breakfast   Followed by  . [START ON 03/30/2020] predniSONE  20 mg Oral Q breakfast   Followed by  . [START ON 04/04/2020] predniSONE  10 mg Oral Q breakfast  . rivaroxaban  20 mg Oral Q supper  . sodium chloride flush  3 mL Intravenous Once  . thiamine  100 mg Oral Daily    Continuous Infusions:   PRN Meds: acetaminophen **OR** acetaminophen, ketotifen, levalbuterol, loperamide, prochlorperazine, Resource ThickenUp Clear, vitamin A & D  Physical Exam Vitals and nursing note reviewed.  Constitutional:      General: He is not in acute distress. Pulmonary:     Effort: Pulmonary effort is normal.  Neurological:     Mental Status: He is alert and oriented to person, place, and time.     Motor: Weakness present.     Comments: Very hard of hearing, hearing aids in place  Psychiatric:        Mood and Affect: Mood normal.        Behavior: Behavior normal.        Thought Content: Thought content normal.              Intake/Output Summary (Last 24 hours) at 03/26/2020 1620 Last data filed at 03/26/2020 1300 Gross per 24 hour  Intake 820 ml  Output --  Net 820 ml   LBM: Last BM Date: 03/25/20 Baseline Weight: Weight: 101.2 kg Most recent weight: Weight: 95.3 kg       Palliative Assessment/Data: PPS: 50%      Patient Active Problem List   Diagnosis Date Noted  . Hypoxia   . Palliative care by specialist   . Pressure injury of skin 03/18/2020  . Immunosuppressed due to chemotherapy 03/18/2020  .  Acquired thrombophilia ([place]) 03/18/2020  . Weakness generalized 03/18/2020  . Frequent falls 03/18/2020  . Long term (current) use of anticoagulants 03/18/2020  . Lactic acidosis 03/18/2020  . Thrombocytopenia ([place]) 03/18/2020  . Hypoalbuminemia 03/18/2020  . Hyperbilirubinemia 03/18/2020  . Demand ischemia ([place]) 03/18/2020  . Elevated brain natriuretic peptide (BNP) level 03/18/2020  . Elevated procalcitonin 03/18/2020  . Hypokalemia 03/18/2020  . Anemia of chronic disease 03/18/2020  . Scalp hematoma 03/18/2020  . 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 Diverticulosis 03/18/2020  . Obesity (BMI 30.0-34.9) 03/18/2020  . Septic shock ([place]) 03/15/2020  . Encounter for antineoplastic chemotherapy 01/03/2020  . DNR (do not resuscitate) discussion 01/03/2020  . Visit for monitoring Tikosyn therapy 06/05/2018  . Persistent atrial fibrillation ([place])   . Pneumothorax on right 05/19/2017  . Right-sided muscle weakness 04/28/2013  . 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 Coronary artery disease-nonobstructive catheter in October 2011 12/27/2010  . PACEMAKER, [employer] Jude dual-chamber 09/14/2010  . Multiple myeloma ([place]) 07/21/2010  . SMOKER 07/21/2010  . Obstructive sleep apnea 06/12/2009  . Atrioventricular block, heart high-grade 01/29/2009  . SICK SINUS/ TACHY-BRADY SYNDROME 01/29/2009  . Atrial tachycardia/fibrillation 01/14/2009  . SCIATICA 01/14/2009  . Essential hypertension 01/03/2009  . VENOUS INSUFFICIENCY 01/03/2009  . ALLERGY 01/03/2009    Palliative Care Assessment & Plan   Patient Profile: 79 y.o. male admitted on 03/15/2020 with past medical history significant for multiple myeloma/last chemo on 6/22. Was diagnosed in 2011- treated recently restarted and he received 3 of 4 planned treatments. Oncology now recommends holding treatment due to functional decline.  Patient admitted through the ER found to be hypotensive,  chest x-ray without significant infiltrates, was admitted with diagnosis of  sepsis from unclear etiology and placed on empiric antibiotics and IV fluids and cultures were obtained. Patient has had continued physical and functional decline over the past several months.  Initial procalcitonin slightly elevated at 2.9.  Patient continued with hypoxia and CT of the chest obtained demonstrated bilateral effusions, scattered areas of fibrotic infiltrates/chronic emphysema changes.  Pulmonary critical care has been consulted 2/2 -patient has been on 8 days of antibiotics, gentle diuresing and still remains hypoxic with an abnormal chest x-ray.  Palliative medicine consulted for goals of care.   Assessment/Recommendations/Plan  Improving respiratory and mental status Plan for d/c to SNF Referral for outpatient Palliative to follow at SNF MOST completed DNR   Code Status: DNR  Prognosis:  Unable to determine  Discharge Planning: [facility] for rehab with Palliative care service follow-up  Care plan was discussed with patient and his family  Thank you for allowing the Palliative Medicine Team to assist in the care of this patient.   Time In: 1500 Time Out: 1645 Total Time 90 mins Prolonged Time Billed yes      Greater than 50%  of this time was spent counseling and coordinating care related to the above assessment and plan.  Mariana Kaufman, AGNP-C Palliative Medicine   Please contact Palliative Medicine Team phone at [phone] for questions and concerns.

## 2020-03-26 NOTE — TOC Progression Note (Signed)
Transition of Care [facility] [place]) - Progression Note    Patient Details  Name: [name] MRN: [id number] Date of Birth: 07-04-1941  Transition of Care [facility]) CM/SW [place], [place] Phone Number: 03/26/2020, 5:42 PM  Clinical Narrative:     CSW spoke with patients daughter [name]. Patients daughter [name] wants CSW to check in with [facility]  in [place] to see if they have bed availability for patient. If no bed availability [name] agreed to fill out paperwork for patient to go to [facility]. CSW will follow up with [facility] in the morning to check and see if they can make a bed offer for patient. CSW will then follow up with patients daughter [name].  CSW will continue to follow.   Expected Discharge Plan: [place] Barriers to Discharge: Continued Medical Work up  Expected Discharge Plan and Services Expected Discharge Plan: [place] arrangements for the past 2 months: Single Family Home                                       Social Determinants of Health (SDOH) Interventions    Readmission Risk Interventions No flowsheet data found.

## 2020-03-26 NOTE — Progress Notes (Signed)
PROGRESS NOTE    [name]  PZW:[id number] DOB: [birth year] DOA: 03/15/2020 PCP: [name], MD   Brief Narrative:  Patient is a 79 year old male with history of multiple myeloma on chemotherapy, coronary artery disease, atrial fibrillation , hypertension who presented with generalized weakness and lower extremity pain.  On presentation he had a low-grade temperature, blood pressure was soft.  Chest x-ray did not show any infiltrate. Lactic acid was slightly elevated.  He was started on broad-spectrum biotics and IV fluids for the suspicion of sepsis.  Hospital course remarkable for confusion, persistent respiratory failure.  PCCM, palliative care was consulted.  His respiratory status has significantly improved since last few days.  Now the plan is to place him in skilled nursing facility.   Assessment & Plan:   Active Problems:   Multiple myeloma ([place])   Essential hypertension   Atrial tachycardia/fibrillation   PACEMAKER, [employer] dual-chamber   Coronary artery disease-nonobstructive catheter in October 2011   DNR (do not resuscitate) discussion   Septic shock ([place])   Pressure injury of skin   Immunosuppressed due to chemotherapy   Acquired thrombophilia ([place])   Weakness generalized   Frequent falls   Long term (current) use of anticoagulants   Lactic acidosis   Thrombocytopenia (HCC)   Hypoalbuminemia   Hyperbilirubinemia   Demand ischemia (HCC)   Elevated brain natriuretic peptide (BNP) level   Elevated procalcitonin   Hypokalemia   Anemia of chronic disease   Scalp hematoma   Obesity (BMI 30.0-34.9)   Hypoxia   Palliative care by specialist   Sepsis: Suspected on admission.  Unclear source.  Presented with tachypnea, hypotension, elevated lactic acid level.  UA was unremarkable.  Chest x-ray did not show any infiltrates.  No cellulitis changes.  CT abdomen/pelvis with contrast showed diverticulosis without diverticulitis.  Blood cultures negative so far.   Procalcitonin elevated on presentation.  C. difficile was negative.  Covid testing negative.  Antibiotics have been discontinued now.He has mild  Leucocytosis which cud be associated with steroids.  Acute hypoxic respiratory failure: PCCM also consulted.  CTA chest did not show any PE.  Chest imaging showed opacities in the anterior and posterior right upper lobe suspicion for pulmonary edema/infectious etiology.  Suspicion for aspiration pneumonia ,speech therapy also consulted.On dysphagia 3 diet.  There is also suspicion for drug-induced pneumonitis from chemo versus progressive multiple myeloma.  Currently on room air. He has been started on tapering dose of oral steroids.  Multiple myeloma: Currently on chemotherapy.  Follows with Dr. Julien Nordmann.  Outpatient follow-up recommended.  Thrombocytopenia/normocytic anemia: Most likely associated with multiple myeloma.  Continue to monitor.  Hypokalemia/hypomagnesemia: Continue to monitor and supplement as necessary.  Generalized weakness: PT recommended skilled nursing facility.  Hypertension: Currently blood pressure soft.  Home antihypertensives on hold.  Paroxysmal A. fib: Continue Xarelto.  Currently rate is well controlled.  Echocardiogram showed ejection fraction 55 to 60%, no regional wall motion abnormality.S/P pacemaker placement  Hyperlipidemia:Continue statin  Conjunctivitis of left eye: On Cipro eyedrops.  Goals of care: Elderly patient with multiple myeloma with persistent hypoxic respiratory failure.  Palliative care closely following and  has a meeting with family today at  3.Currently remains full code.          DVT prophylaxis: Xarelto Code Status: Full Family Communication: Discussed with the daughter on phone today  Status is: Inpatient  Remains inpatient appropriate because:Unsafe DC   Dispo: The patient is from: Home  Anticipated d/c is to: SNF              Anticipated d/c date is:As soon as bed is  available              Patient currently medically stable for discharge   Consultants: PCCM, palliative care  Procedures: None  Antimicrobials:  Anti-infectives (From admission, onward)   Start     Dose/Rate Route Frequency Ordered Stop   03/20/20 0800  vancomycin (VANCOREADY) IVPB 750 mg/150 mL  Status:  Discontinued        750 mg 150 mL/hr over 60 Minutes Intravenous Every 12 hours 03/19/20 1905 03/21/20 1337   03/19/20 2000  vancomycin (VANCOREADY) IVPB 1500 mg/300 mL        1,500 mg 150 mL/hr over 120 Minutes Intravenous  Once 03/19/20 1905 03/19/20 2231   03/19/20 2000  piperacillin-tazobactam (ZOSYN) IVPB 3.375 g  Status:  Discontinued        3.375 g 12.5 mL/hr over 240 Minutes Intravenous Every 8 hours 03/19/20 1905 03/24/20 0745   03/19/20 1930  azithromycin (ZITHROMAX) tablet 500 mg        500 mg Oral Daily 03/19/20 1847 03/21/20 1030   03/16/20 0100  vancomycin (VANCOREADY) IVPB 750 mg/150 mL  Status:  Discontinued        750 mg 150 mL/hr over 60 Minutes Intravenous Every 12 hours 03/15/20 1257 03/17/20 0755   03/15/20 2200  ceFEPIme (MAXIPIME) 2 g in sodium chloride 0.9 % 100 mL IVPB  Status:  Discontinued        2 g 200 mL/hr over 30 Minutes Intravenous Every 8 hours 03/15/20 1257 03/19/20 0923   03/15/20 2200  acyclovir (ZOVIRAX) 200 MG capsule 200 mg     Discontinue     200 mg Oral 2 times daily 03/15/20 1558     03/15/20 1300  ceFEPIme (MAXIPIME) 2 g in sodium chloride 0.9 % 100 mL IVPB        2 g 200 mL/hr over 30 Minutes Intravenous  Once 03/15/20 1247 03/15/20 1402   03/15/20 1300  metroNIDAZOLE (FLAGYL) IVPB 500 mg        500 mg 100 mL/hr over 60 Minutes Intravenous  Once 03/15/20 1247 03/15/20 1433   03/15/20 1300  vancomycin (VANCOCIN) IVPB 1000 mg/200 mL premix  Status:  Discontinued        1,000 mg 200 mL/hr over 60 Minutes Intravenous  Once 03/15/20 1247 03/15/20 1254   03/15/20 1300  vancomycin (VANCOREADY) IVPB 2000 mg/400 mL        2,000 mg 200  mL/hr over 120 Minutes Intravenous  Once 03/15/20 1254 03/15/20 1553      Subjective: Patient seen and examined at the bedside this morning.  Hemodynamically stable.  Comfortable.  On room air.  Denies any shortness of breath or chest pain or cough.  Objective: Vitals:   03/25/20 1608 03/25/20 2118 03/26/20 0038 03/26/20 0404  BP: 106/66 112/69 118/67 126/73  Pulse:  70 70 70  Resp:  19 15 19   Temp: 98.8 F (37.1 C) 98.2 F (36.8 C) 98.1 F (36.7 C) 98.6 F (37 C)  TempSrc: Oral Oral Axillary Oral  SpO2:  95% 92% 91%  Weight:    95.3 kg  Height:        Intake/Output Summary (Last 24 hours) at 03/26/2020 0804 Last data filed at 03/25/2020 1800 Gross per 24 hour  Intake 700 ml  Output --  Net 700 ml   [employer]  03/24/20 0339 03/25/20 0333 03/26/20 0404  Weight: 99.8 kg 93.9 kg 95.3 kg    Examination:  General exam: Elderly deconditioned, debilitated male, hard of hearing, Respiratory system: Bilaterally clear Cardiovascular system: S1 & S2 heard, RRR. No JVD, murmurs, rubs, gallops or clicks. Gastrointestinal system: Abdomen is nondistended, soft and nontender. No organomegaly or masses felt. Normal bowel sounds heard. Central nervous system: Alert and oriented. Extremities: No edema, no clubbing ,no cyanosis Skin: No rashes, lesions or ulcers,no icterus ,no pallo  Data Reviewed: I have personally reviewed following labs and imaging studies  CBC: Recent Labs  Lab 03/20/20 1216 03/21/20 0348 03/22/20 0343 03/23/20 0837 03/25/20 0459  WBC 10.3 10.1 9.3 10.5 15.3*  NEUTROABS 5.1 5.5 5.2 8.9* 12.3*  HGB 10.6* 10.3* 11.1* 11.6* 10.9*  HCT 30.8* 30.1* 32.7* 33.9* 31.7*  MCV 96.9 95.9 96.5 96.0 98.1  PLT 250 260 340 438* 567*   Basic Metabolic Panel: Recent Labs  Lab 03/21/20 0348 03/22/20 0343 03/23/20 0837 03/24/20 0358 03/25/20 0459  NA 141 143 141 141 143  K 3.9 4.0 4.2 4.0 4.0  CL 114* 110 106 105 109  CO2 17* 21* 22 22 23   GLUCOSE 109* 98  159* 162* 129*  BUN 20 21 26* 33* 32*  CREATININE 0.92 0.89 0.91 0.97 0.74  CALCIUM 8.5* 8.6* 8.8* 9.1 9.1  MG 1.5* 2.0 2.0 2.2 2.5*   GFR: Estimated Creatinine Clearance: 85.3 mL/min (by C-G formula based on SCr of 0.74 mg/dL). Liver Function Tests: Recent Labs  Lab 03/20/20 1216 03/21/20 0348 03/22/20 0343  AST 63* 63* 54*  ALT 38 39 36  ALKPHOS 36* 42 43  BILITOT 1.6* 1.5* 1.0  PROT 5.5* 5.3* 5.9*  ALBUMIN 2.6* 2.4* 2.4*   No results for input(s): LIPASE, AMYLASE in the last 168 hours. No results for input(s): AMMONIA in the last 168 hours. Coagulation Profile: No results for input(s): INR, PROTIME in the last 168 hours. Cardiac Enzymes: No results for input(s): CKTOTAL, CKMB, CKMBINDEX, TROPONINI in the last 168 hours. BNP (last 3 results) No results for input(s): PROBNP in the last 8760 hours. HbA1C: No results for input(s): HGBA1C in the last 72 hours. CBG: No results for input(s): GLUCAP in the last 168 hours. Lipid Profile: No results for input(s): CHOL, HDL, LDLCALC, TRIG, CHOLHDL, LDLDIRECT in the last 72 hours. Thyroid Function Tests: No results for input(s): TSH, T4TOTAL, FREET4, T3FREE, THYROIDAB in the last 72 hours. Anemia Panel: No results for input(s): VITAMINB12, FOLATE, FERRITIN, TIBC, IRON, RETICCTPCT in the last 72 hours. Sepsis Labs: Recent Labs  Lab 03/19/20 1048 03/19/20 1612 03/22/20 1600  PROCALCITON  --   --  0.27  LATICACIDVEN 1.8 1.6  --     Recent Results (from the past 240 hour(s))  Respiratory Panel by PCR     Status: None   Collection Time: 03/19/20  7:22 PM   Specimen: Nasopharyngeal Swab; Respiratory  Result Value Ref Range Status   Adenovirus NOT DETECTED NOT DETECTED Final   Coronavirus 229E NOT DETECTED NOT DETECTED Final    Comment: (NOTE) The Coronavirus on the Respiratory Panel, DOES NOT test for the novel  Coronavirus (2019 nCoV)    Coronavirus HKU1 NOT DETECTED NOT DETECTED Final   Coronavirus NL63 NOT DETECTED  NOT DETECTED Final   Coronavirus OC43 NOT DETECTED NOT DETECTED Final   Metapneumovirus NOT DETECTED NOT DETECTED Final   Rhinovirus / Enterovirus NOT DETECTED NOT DETECTED Final   Influenza A NOT DETECTED NOT DETECTED Final  Influenza B NOT DETECTED NOT DETECTED Final   Parainfluenza Virus 1 NOT DETECTED NOT DETECTED Final   Parainfluenza Virus 2 NOT DETECTED NOT DETECTED Final   Parainfluenza Virus 3 NOT DETECTED NOT DETECTED Final   Parainfluenza Virus 4 NOT DETECTED NOT DETECTED Final   Respiratory Syncytial Virus NOT DETECTED NOT DETECTED Final   Bordetella pertussis NOT DETECTED NOT DETECTED Final   Chlamydophila pneumoniae NOT DETECTED NOT DETECTED Final   Mycoplasma pneumoniae NOT DETECTED NOT DETECTED Final    Comment: Performed at [facility], [place] [address], [place], [place] [address]  MRSA PCR Screening     Status: None   Collection Time: 03/19/20  7:22 PM   Specimen: Nasal Mucosa; Nasopharyngeal  Result Value Ref Range Status   MRSA by PCR NEGATIVE NEGATIVE Final    Comment:        The GeneXpert MRSA Assay (FDA approved for NASAL specimens only), is one component of a comprehensive MRSA colonization surveillance program. It is not intended to diagnose MRSA infection nor to guide or monitor treatment for MRSA infections. Performed at [facility], [place] [address], [place], [place] [address]          Radiology Studies: DG CHEST PORT 1 VIEW  Result Date: 03/25/2020 CLINICAL DATA:  Respiratory failure EXAM: PORTABLE CHEST 1 VIEW COMPARISON:  03/23/2011 FINDINGS: Left pacer remains in place with leads in the right atrium and right ventricle. Cardiomegaly, vascular congestion. No confluent opacities or effusions. No acute bony abnormality. IMPRESSION: Cardiomegaly, vascular congestion. Electronically Signed   By: Rolm Baptise M.D.   On: 03/25/2020 08:13   DG Swallowing Func-Speech Pathology  Result Date: 03/24/2020 Objective Swallowing Evaluation: Type  of Study: Bedside Swallow Evaluation  Patient Details Name: [name] MRN: [id number] Date of Birth: 1940/10/25 Today's Date: 03/24/2020 Time: SLP Start Time (ACUTE ONLY): 1035 -SLP Stop Time (ACUTE ONLY): 1059 SLP Time Calculation (min) (ACUTE ONLY): 24 min Past Medical History: Past Medical History: Diagnosis Date . Atrial flutter ([place])   A. s/p prior ablation;  B.  s/p CTI ablation 10/24/10 (Dr. Caryl Comes) . CAD (coronary artery disease)   A.  s/p CFX in the past;   B.  cath 06/2010: LAD 30-40%, D1 50%, OM1 50%, AVCFX stent 30%, dCFX 70-80% (med Rx), mRCA 40%;      C.  Echo 06/2010: EF 60-65%, mod LVH; mild LAE    . Complete heart block (HCC)   a. s/p STJ pacemaker . Cubital tunnel syndrome  . Degenerative disc disease  . Heart block  . HOH (hard of hearing)  . Hx of adenomatous colonic polyps  . Hyperlipidemia  . Hypertension  . Multiple myeloma  . Persistent atrial fibrillation ([place])  . Sleep apnea  . Venous insufficiency  Past Surgical History: Past Surgical History: Procedure Laterality Date . CARDIOVERSION N/A 04/20/2018  Procedure: CARDIOVERSION;  Surgeon: Skeet Latch, MD;  Location: [facility];  Service: Cardiovascular;  Laterality: N/A; . COLONOSCOPY  10-07-2009  TA polyp, tics, hems  . CORONARY ANGIOPLASTY WITH STENT PLACEMENT  2005 . PACEMAKER PLACEMENT  2011 . POLYPECTOMY   . PPM GENERATOR CHANGEOUT N/A 09/27/2019  Procedure: PPM GENERATOR CHANGEOUT;  Surgeon: Deboraha Sprang, MD;  Location: [facility];  Service: Cardiovascular;  Laterality: N/A; . Right olecranon nerve surgery   . VENTRAL HERNIA REPAIR   HPI: 79 year old man with complex hx including afib, CAD, CHB s/p PCM, ?afib on AC, Multilpe myeloma on velcade, revlimid, decodron  presenting with FTT and worsening dyspnea.  CXR with infiltrates so tx with CAP coverage followed by HCAP coverage, no improvement.  CTA chest showing bilateral small effusions and scattered NSIP-type infitlrates in setting f background emphysema. Per son at  bedside patient has been declining pretty rapidly in past 3 weeks.  Patient denies cough, there may be some aspiration type symptoms.  No data recorded Assessment / Plan / Recommendation CHL IP CLINICAL IMPRESSIONS 03/24/2020 Clinical Impression Pt presents with pharyngeal dysphagia characterized by reduced lingual retraction, reduced anterior laryngeal movement, and inconistently incomplete epiglottic inversion. He demonstrated vallecular residue and penetration (PAS 3,5) of thin liquids with ultimate silent aspiration (PAS 8). Frequency of penetration was reduced with reduced bolus sizes but it was not eliminated. Pt exhibited difficulty with A-P transport of the barium tablet with nectar thick liquids, but less difficulty was noted when it was adminsitered with puree and the oral phase of his swallow was [facility]. A dysphagia 3 diet with nectar thick liquids is recommended at this time. SLP will follow for dysphagia treatment.   SLP Visit Diagnosis Dysphagia, oropharyngeal phase (R13.12) Attention and concentration deficit following -- Frontal lobe and executive function deficit following -- Impact on safety and function Moderate aspiration risk   CHL IP TREATMENT RECOMMENDATION 03/24/2020 Treatment Recommendations Therapy as outlined in treatment plan below   Prognosis 03/24/2020 Prognosis for Safe Diet Advancement Good Barriers to Reach Goals -- Barriers/Prognosis Comment -- CHL IP DIET RECOMMENDATION 03/24/2020 SLP Diet Recommendations Dysphagia 3 (Mech soft) solids;Nectar thick liquid Liquid Administration via Cup;Straw Medication Administration Whole meds with puree Compensations Slow rate;Small sips/bites Postural Changes Seated upright at 90 degrees   CHL IP OTHER RECOMMENDATIONS 03/24/2020 Recommended Consults -- Oral Care Recommendations Oral care BID Other Recommendations --   CHL IP FOLLOW UP RECOMMENDATIONS 03/24/2020 Follow up Recommendations Skilled Nursing facility   [facility] IP FREQUENCY AND DURATION 03/24/2020  Speech Therapy Frequency (ACUTE ONLY) min 2x/week Treatment Duration 2 weeks      CHL IP ORAL PHASE 03/24/2020 Oral Phase WFL Oral - Pudding Teaspoon -- Oral - Pudding Cup -- Oral - Honey Teaspoon -- Oral - Honey Cup -- Oral - Nectar Teaspoon -- Oral - Nectar Cup -- Oral - Nectar Straw -- Oral - Thin Teaspoon -- Oral - Thin Cup -- Oral - Thin Straw -- Oral - Puree -- Oral - Mech Soft -- Oral - Regular -- Oral - Multi-Consistency -- Oral - Pill -- Oral Phase - Comment --  CHL IP PHARYNGEAL PHASE 03/24/2020 Pharyngeal Phase Impaired Pharyngeal- Pudding Teaspoon -- Pharyngeal -- Pharyngeal- Pudding Cup -- Pharyngeal -- Pharyngeal- Honey Teaspoon -- Pharyngeal -- Pharyngeal- Honey Cup -- Pharyngeal -- Pharyngeal- Nectar Teaspoon -- Pharyngeal -- Pharyngeal- Nectar Cup Reduced anterior laryngeal mobility;Pharyngeal residue - valleculae;Reduced tongue base retraction Pharyngeal -- Pharyngeal- Nectar Straw Reduced anterior laryngeal mobility;Pharyngeal residue - valleculae;Reduced tongue base retraction Pharyngeal -- Pharyngeal- Thin Teaspoon -- Pharyngeal -- Pharyngeal- Thin Cup Reduced anterior laryngeal mobility;Pharyngeal residue - valleculae;Penetration/Aspiration during swallow;Penetration/Apiration after swallow;Reduced tongue base retraction Pharyngeal Material enters airway, remains ABOVE vocal cords and not ejected out;Material enters airway, CONTACTS cords and not ejected out;Material enters airway, passes BELOW cords without attempt by patient to eject out (silent aspiration) Pharyngeal- Thin Straw Reduced anterior laryngeal mobility;Pharyngeal residue - valleculae;Penetration/Aspiration during swallow;Penetration/Apiration after swallow;Reduced tongue base retraction Pharyngeal Material enters airway, remains ABOVE vocal cords and not ejected out;Material enters airway, CONTACTS cords and not ejected out;Material enters airway, passes BELOW cords without attempt by patient to eject out (  silent aspiration)  Pharyngeal- Puree Reduced anterior laryngeal mobility;Pharyngeal residue - valleculae;Reduced tongue base retraction Pharyngeal -- Pharyngeal- Mechanical Soft -- Pharyngeal -- Pharyngeal- Regular -- Pharyngeal -- Pharyngeal- Multi-consistency Reduced anterior laryngeal mobility;Pharyngeal residue - valleculae;Reduced tongue base retraction Pharyngeal -- Pharyngeal- Pill Reduced anterior laryngeal mobility;Pharyngeal residue - valleculae;Reduced tongue base retraction Pharyngeal -- Pharyngeal Comment --  CHL IP CERVICAL ESOPHAGEAL PHASE 03/24/2020 Cervical Esophageal Phase WFL Pudding Teaspoon -- Pudding Cup -- Honey Teaspoon -- Honey Cup -- Nectar Teaspoon -- Nectar Cup -- Nectar Straw -- Thin Teaspoon -- Thin Cup -- Thin Straw -- Puree -- Mechanical Soft -- Regular -- Multi-consistency -- Pill -- Cervical Esophageal Comment -- Shanika I. Hardin Negus, [place], [place] Office number [phone] Pager [place] 03/24/2020, 1:51 PM                   Scheduled Meds: . acyclovir  200 mg Oral BID  . atorvastatin  10 mg Oral q1800  . ciprofloxacin  1 drop Both Eyes Q4H while awake  . finasteride  5 mg Oral Daily  . Gerhardt's butt cream   Topical QID  . melatonin  3 mg Oral QHS  . multivitamin with minerals  1 tablet Oral Daily  . mupirocin ointment  1 application Topical Daily  . omega-3 acid ethyl esters  1 g Oral Daily  . potassium chloride  40 mEq Oral Daily  . predniSONE  40 mg Oral Q breakfast   Followed by  . [START ON 03/30/2020] predniSONE  20 mg Oral Q breakfast   Followed by  . [START ON 04/04/2020] predniSONE  10 mg Oral Q breakfast  . rivaroxaban  20 mg Oral Q supper  . sodium chloride flush  3 mL Intravenous Once  . thiamine  100 mg Oral Daily   Continuous Infusions:   LOS: 11 days    Time spent: 25 mins.More than 50% of that time was spent in counseling and/or coordination of care.      Shelly Coss, MD [facility] P7/15/2021, 8:04 AM

## 2020-03-26 NOTE — TOC Progression Note (Signed)
Transition of Care [facility]) - Progression Note    Patient Details  Name: [name] MRN: [id number] Date of Birth: 1941-06-16  Transition of Care [facility]) CM/SW [place], [place] Phone Number: 03/26/2020, 4:57 PM  Clinical Narrative:     CSW spoke with Audrea Muscat with [facility] about recommendation from Palliative for Palliative services to follow patient at SNF. CSW tried to call patients daughter and patients son to discuss SNF paperwork that needs to be completed before patient an be admitted. CSW is awaiting callback.  Patient has SNF bed at [facility]. Insurance authorization has been approved.   CSW will continue to follow.   Expected Discharge Plan: [place] Barriers to Discharge: Continued Medical Work up  Expected Discharge Plan and Services Expected Discharge Plan: [place] arrangements for the past 2 months: Single Family Home                                       Social Determinants of Health (SDOH) Interventions    Readmission Risk Interventions No flowsheet data found.

## 2020-03-27 DIAGNOSIS — M6281 Muscle weakness (generalized): Secondary | ICD-10-CM | POA: Diagnosis not present

## 2020-03-27 DIAGNOSIS — M1990 Unspecified osteoarthritis, unspecified site: Secondary | ICD-10-CM | POA: Diagnosis not present

## 2020-03-27 DIAGNOSIS — I10 Essential (primary) hypertension: Secondary | ICD-10-CM | POA: Diagnosis not present

## 2020-03-27 DIAGNOSIS — Z79899 Other long term (current) drug therapy: Secondary | ICD-10-CM | POA: Diagnosis not present

## 2020-03-27 DIAGNOSIS — E785 Hyperlipidemia, unspecified: Secondary | ICD-10-CM | POA: Diagnosis not present

## 2020-03-27 DIAGNOSIS — Z7401 Bed confinement status: Secondary | ICD-10-CM | POA: Diagnosis not present

## 2020-03-27 DIAGNOSIS — I4819 Other persistent atrial fibrillation: Secondary | ICD-10-CM | POA: Diagnosis not present

## 2020-03-27 DIAGNOSIS — R0602 Shortness of breath: Secondary | ICD-10-CM | POA: Diagnosis not present

## 2020-03-27 DIAGNOSIS — I872 Venous insufficiency (chronic) (peripheral): Secondary | ICD-10-CM | POA: Diagnosis not present

## 2020-03-27 DIAGNOSIS — D649 Anemia, unspecified: Secondary | ICD-10-CM | POA: Diagnosis not present

## 2020-03-27 DIAGNOSIS — R52 Pain, unspecified: Secondary | ICD-10-CM | POA: Diagnosis not present

## 2020-03-27 DIAGNOSIS — R2681 Unsteadiness on feet: Secondary | ICD-10-CM | POA: Diagnosis not present

## 2020-03-27 DIAGNOSIS — M2550 Pain in unspecified joint: Secondary | ICD-10-CM | POA: Diagnosis not present

## 2020-03-27 DIAGNOSIS — R0902 Hypoxemia: Secondary | ICD-10-CM | POA: Diagnosis not present

## 2020-03-27 DIAGNOSIS — I4891 Unspecified atrial fibrillation: Secondary | ICD-10-CM | POA: Diagnosis not present

## 2020-03-27 DIAGNOSIS — R1312 Dysphagia, oropharyngeal phase: Secondary | ICD-10-CM | POA: Diagnosis not present

## 2020-03-27 DIAGNOSIS — G4730 Sleep apnea, unspecified: Secondary | ICD-10-CM | POA: Diagnosis not present

## 2020-03-27 DIAGNOSIS — J9601 Acute respiratory failure with hypoxia: Secondary | ICD-10-CM | POA: Diagnosis not present

## 2020-03-27 DIAGNOSIS — C9000 Multiple myeloma not having achieved remission: Secondary | ICD-10-CM | POA: Diagnosis not present

## 2020-03-27 DIAGNOSIS — H9190 Unspecified hearing loss, unspecified ear: Secondary | ICD-10-CM | POA: Diagnosis not present

## 2020-03-27 DIAGNOSIS — R531 Weakness: Secondary | ICD-10-CM | POA: Diagnosis not present

## 2020-03-27 DIAGNOSIS — R05 Cough: Secondary | ICD-10-CM | POA: Diagnosis not present

## 2020-03-27 DIAGNOSIS — E119 Type 2 diabetes mellitus without complications: Secondary | ICD-10-CM | POA: Diagnosis not present

## 2020-03-27 LAB — SARS CORONAVIRUS 2 (TAT 6-24 HRS): SARS Coronavirus 2: NEGATIVE

## 2020-03-27 MED ORDER — PREDNISONE 10 MG PO TABS: 10.0000 mg | ORAL_TABLET | Freq: Every day | ORAL | 0 refills | Status: AC

## 2020-03-27 MED ORDER — MELATONIN 3 MG PO TABS: 3.0000 mg | ORAL_TABLET | Freq: Every day | ORAL | 0 refills | Status: AC

## 2020-03-27 MED ORDER — PREDNISONE 20 MG PO TABS: 40.0000 mg | ORAL_TABLET | Freq: Every day | ORAL | 0 refills | Status: AC

## 2020-03-27 MED ORDER — PREDNISONE 20 MG PO TABS: 20.0000 mg | ORAL_TABLET | Freq: Every day | ORAL | 0 refills | Status: AC

## 2020-03-27 NOTE — Care Management Important Message (Signed)
Important Message  Patient Details  Name: [name] MRN: [id number] Date of Birth: 04/18/1941   Medicare Important Message Given:  Yes     [name] 03/27/2020, 10:54 AM

## 2020-03-27 NOTE — Progress Notes (Signed)
PROGRESS NOTE    [name]  AQT:[id number] DOB: 12-25-1940 DOA: 03/15/2020 PCP: [name], MD   Brief Narrative:  Patient is a 79 year old male with history of multiple myeloma on chemotherapy, coronary artery disease, atrial fibrillation , hypertension who presented with generalized weakness and lower extremity pain.  On presentation he had a low-grade temperature, blood pressure was soft.  Chest x-ray did not show any infiltrate. Lactic acid was slightly elevated.  He was started on broad-spectrum biotics and IV fluids for the suspicion of sepsis.  Hospital course remarkable for confusion, persistent respiratory failure.  PCCM, palliative care was consulted.  His respiratory status has significantly improved since last few days.  Now the plan is to place him in skilled nursing facility.  He is medically stable for discharge as soon as bed is available.   Assessment & Plan:   Active Problems:   Multiple myeloma ([place])   Essential hypertension   Atrial tachycardia/fibrillation   PACEMAKER, 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)   Weakness generalized   Frequent falls   Long term (current) use of anticoagulants   Lactic acidosis   Thrombocytopenia ([facility])   Hypoalbuminemia   Hyperbilirubinemia   Demand ischemia ([facility])   Elevated brain natriuretic peptide (BNP) level   Elevated procalcitonin   Hypokalemia   Anemia of chronic disease   Scalp hematoma   Obesity (BMI 30.0-34.9)   Hypoxia   Palliative care by specialist   Goals of care, counseling/discussion   Advanced care planning/counseling discussion   Sepsis: Suspected on admission.  Unclear source.  Presented with tachypnea, hypotension, elevated lactic acid level.  UA was unremarkable.  Chest x-ray did not show any  infiltrates.  No cellulitis changes.  CT abdomen/pelvis with contrast showed diverticulosis without diverticulitis.  Blood cultures negative so far.  Procalcitonin elevated on presentation.  C. difficile was negative.  Covid testing negative.  Antibiotics have been discontinued now.He has mild  Leucocytosis which cud be associated with steroids.  Acute hypoxic respiratory failure: PCCM also consulted.  CTA chest did not show any PE.  Chest imaging showed opacities in the anterior and posterior right upper lobe suspicion for pulmonary edema/infectious etiology.  Suspicion for aspiration pneumonia ,speech therapy also consulted.On dysphagia 3 diet.  There is also suspicion for drug-induced pneumonitis from chemo versus progressive multiple myeloma.  Currently on room air. He has been started on tapering dose of oral steroids.  Multiple myeloma: Currently on chemotherapy.  Follows with Dr. Julien Nordmann.  Outpatient follow-up recommended.  Thrombocytopenia/normocytic anemia: Most likely associated with multiple myeloma.  Continue to monitor.  Hypokalemia/hypomagnesemia: Continue to monitor and supplement as necessary.  Generalized weakness: PT recommended skilled nursing facility.  Hypertension: Currently blood pressure soft.  Home antihypertensives on hold.  Paroxysmal A. fib: Continue Xarelto.  Currently rate is well controlled.  Echocardiogram showed ejection fraction 55 to 60%, no regional wall motion abnormality.S/P pacemaker placement  Hyperlipidemia:Continue statin  Conjunctivitis of left eye: On Cipro eyedrops.  Goals of care: Elderly patient with multiple myeloma with persistent hypoxic respiratory failure.  Palliative care closely following and  has a meeting with family today at  3.Currently remains full code.          DVT prophylaxis: Xarelto Code Status: Full Family Communication: Discussed with the daughter on phone on  03/26/20  Status is: Inpatient  Remains inpatient appropriate  because:Unsafe DC   Dispo: The patient is from: Home              Anticipated d/c is to: SNF              Anticipated d/c date is:As soon as bed is available              Patient currently medically stable for discharge   Consultants: PCCM, palliative care  Procedures: None  Antimicrobials:  Anti-infectives (From admission, onward)   Start     Dose/Rate Route Frequency Ordered Stop   03/20/20 0800  vancomycin (VANCOREADY) IVPB 750 mg/150 mL  Status:  Discontinued        750 mg 150 mL/hr over 60 Minutes Intravenous Every 12 hours 03/19/20 1905 03/21/20 1337   03/19/20 2000  vancomycin (VANCOREADY) IVPB 1500 mg/300 mL        1,500 mg 150 mL/hr over 120 Minutes Intravenous  Once 03/19/20 1905 03/19/20 2231   03/19/20 2000  piperacillin-tazobactam (ZOSYN) IVPB 3.375 g  Status:  Discontinued        3.375 g 12.5 mL/hr over 240 Minutes Intravenous Every 8 hours 03/19/20 1905 03/24/20 0745   03/19/20 1930  azithromycin (ZITHROMAX) tablet 500 mg        500 mg Oral Daily 03/19/20 1847 03/21/20 1030   03/16/20 0100  vancomycin (VANCOREADY) IVPB 750 mg/150 mL  Status:  Discontinued        750 mg 150 mL/hr over 60 Minutes Intravenous Every 12 hours 03/15/20 1257 03/17/20 0755   03/15/20 2200  ceFEPIme (MAXIPIME) 2 g in sodium chloride 0.9 % 100 mL IVPB  Status:  Discontinued        2 g 200 mL/hr over 30 Minutes Intravenous Every 8 hours 03/15/20 1257 03/19/20 0923   03/15/20 2200  acyclovir (ZOVIRAX) 200 MG capsule 200 mg     Discontinue     200 mg Oral 2 times daily 03/15/20 1558     03/15/20 1300  ceFEPIme (MAXIPIME) 2 g in sodium chloride 0.9 % 100 mL IVPB        2 g 200 mL/hr over 30 Minutes Intravenous  Once 03/15/20 1247 03/15/20 1402   03/15/20 1300  metroNIDAZOLE (FLAGYL) IVPB 500 mg        500 mg 100 mL/hr over 60 Minutes Intravenous  Once 03/15/20 1247 03/15/20 1433   03/15/20 1300  vancomycin (VANCOCIN) IVPB 1000 mg/200 mL premix  Status:  Discontinued        1,000 mg 200  mL/hr over 60 Minutes Intravenous  Once 03/15/20 1247 03/15/20 1254   03/15/20 1300  vancomycin (VANCOREADY) IVPB 2000 mg/400 mL        2,000 mg 200 mL/hr over 120 Minutes Intravenous  Once 03/15/20 1254 03/15/20 1553      Subjective: Patient seen and examined at the bedside this morning.  Hemodynamically stable.  Comfortable.  On room air.  Objective: Vitals:   03/26/20 2043 03/27/20 0051 03/27/20 0459 03/27/20 0732  BP: 126/62 125/73 125/78 134/75  Pulse: 70 70 70 70  Resp: 17 20 19 20   Temp: 98.4 F (36.9 C) 98.5 F (36.9 C) 98.6 F (37 C) 98.2 F (36.8 C)  TempSrc: Oral Oral Oral Oral  SpO2: 97% 97% 99% 95%  Weight:   93.4 kg   Height:        Intake/Output Summary (Last 24 hours) at 03/27/2020 7096 Last data filed at  03/26/2020 2000 Gross per 24 hour  Intake 600 ml  Output 400 ml  Net 200 ml   Filed Weights   03/25/20 0333 03/26/20 0404 03/27/20 0459  Weight: 93.9 kg 95.3 kg 93.4 kg    Examination:  General exam: Elderly deconditioned, debilitated male, hard of hearing, Respiratory system: Bilaterally clear Cardiovascular system: S1 & S2 heard, RRR. No JVD, murmurs, rubs, gallops or clicks. Gastrointestinal system: Abdomen is nondistended, soft and nontender. No organomegaly or masses felt. Normal bowel sounds heard. Central nervous system: Alert and oriented. Extremities: No edema, no clubbing ,no cyanosis Skin: No rashes, lesions or ulcers,no icterus ,no pallo  Data Reviewed: I have personally reviewed following labs and imaging studies  CBC: Recent Labs  Lab 03/21/20 0348 03/22/20 0343 03/23/20 0837 03/25/20 0459 03/26/20 0803  WBC 10.1 9.3 10.5 15.3* 11.9*  NEUTROABS 5.5 5.2 8.9* 12.3* 7.5  HGB 10.3* 11.1* 11.6* 10.9* 11.1*  HCT 30.1* 32.7* 33.9* 31.7* 32.8*  MCV 95.9 96.5 96.0 98.1 99.1  PLT 260 340 438* 523* 932*   Basic Metabolic Panel: Recent Labs  Lab 03/21/20 0348 03/22/20 0343 03/23/20 0837 03/24/20 0358 03/25/20 0459  NA 141 143  141 141 143  K 3.9 4.0 4.2 4.0 4.0  CL 114* 110 106 105 109  CO2 17* 21* 22 22 23   GLUCOSE 109* 98 159* 162* 129*  BUN 20 21 26* 33* 32*  CREATININE 0.92 0.89 0.91 0.97 0.74  CALCIUM 8.5* 8.6* 8.8* 9.1 9.1  MG 1.5* 2.0 2.0 2.2 2.5*   GFR: Estimated Creatinine Clearance: 84.6 mL/min (by C-G formula based on SCr of 0.74 mg/dL). Liver Function Tests: Recent Labs  Lab 03/20/20 1216 03/21/20 0348 03/22/20 0343  AST 63* 63* 54*  ALT 38 39 36  ALKPHOS 36* 42 43  BILITOT 1.6* 1.5* 1.0  PROT 5.5* 5.3* 5.9*  ALBUMIN 2.6* 2.4* 2.4*   No results for input(s): LIPASE, AMYLASE in the last 168 hours. No results for input(s): AMMONIA in the last 168 hours. Coagulation Profile: No results for input(s): INR, PROTIME in the last 168 hours. Cardiac Enzymes: No results for input(s): CKTOTAL, CKMB, CKMBINDEX, TROPONINI in the last 168 hours. BNP (last 3 results) No results for input(s): PROBNP in the last 8760 hours. HbA1C: No results for input(s): HGBA1C in the last 72 hours. CBG: No results for input(s): GLUCAP in the last 168 hours. Lipid Profile: No results for input(s): CHOL, HDL, LDLCALC, TRIG, CHOLHDL, LDLDIRECT in the last 72 hours. Thyroid Function Tests: No results for input(s): TSH, T4TOTAL, FREET4, T3FREE, THYROIDAB in the last 72 hours. Anemia Panel: No results for input(s): VITAMINB12, FOLATE, FERRITIN, TIBC, IRON, RETICCTPCT in the last 72 hours. Sepsis Labs: Recent Labs  Lab 03/22/20 1600  PROCALCITON 0.27    Recent Results (from the past 240 hour(s))  Respiratory Panel by PCR     Status: None   Collection Time: 03/19/20  7:22 PM   Specimen: Nasopharyngeal Swab; Respiratory  Result Value Ref Range Status   Adenovirus NOT DETECTED NOT DETECTED Final   Coronavirus 229E NOT DETECTED NOT DETECTED Final    Comment: (NOTE) The Coronavirus on the Respiratory Panel, DOES NOT test for the novel  Coronavirus (2019 nCoV)    Coronavirus HKU1 NOT DETECTED NOT DETECTED Final    Coronavirus NL63 NOT DETECTED NOT DETECTED Final   Coronavirus OC43 NOT DETECTED NOT DETECTED Final   Metapneumovirus NOT DETECTED NOT DETECTED Final   Rhinovirus / Enterovirus NOT DETECTED NOT DETECTED Final   Influenza  A NOT DETECTED NOT DETECTED Final   Influenza B NOT DETECTED NOT DETECTED Final   Parainfluenza Virus 1 NOT DETECTED NOT DETECTED Final   Parainfluenza Virus 2 NOT DETECTED NOT DETECTED Final   Parainfluenza Virus 3 NOT DETECTED NOT DETECTED Final   Parainfluenza Virus 4 NOT DETECTED NOT DETECTED Final   Respiratory Syncytial Virus NOT DETECTED NOT DETECTED Final   Bordetella pertussis NOT DETECTED NOT DETECTED Final   Chlamydophila pneumoniae NOT DETECTED NOT DETECTED Final   Mycoplasma pneumoniae NOT DETECTED NOT DETECTED Final    Comment: Performed at [facility], [place] [address], [place], [place] [address]  MRSA PCR Screening     Status: None   Collection Time: 03/19/20  7:22 PM   Specimen: Nasal Mucosa; Nasopharyngeal  Result Value Ref Range Status   MRSA by PCR NEGATIVE NEGATIVE Final    Comment:        The GeneXpert MRSA Assay (FDA approved for NASAL specimens only), is one component of a comprehensive MRSA colonization surveillance program. It is not intended to diagnose MRSA infection nor to guide or monitor treatment for MRSA infections. Performed at [facility], [place] [address], [place], [place] [address]          Radiology Studies: No results found.      Scheduled Meds: . acyclovir  200 mg Oral BID  . atorvastatin  10 mg Oral q1800  . ciprofloxacin  1 drop Both Eyes Q4H while awake  . finasteride  5 mg Oral Daily  . Gerhardt's butt cream   Topical QID  . melatonin  3 mg Oral QHS  . multivitamin with minerals  1 tablet Oral Daily  . mupirocin ointment  1 application Topical Daily  . omega-3 acid ethyl esters  1 g Oral Daily  . potassium chloride  40 mEq Oral Daily  . predniSONE  40 mg Oral Q breakfast   Followed by   . [START ON 03/30/2020] predniSONE  20 mg Oral Q breakfast   Followed by  . [START ON 04/04/2020] predniSONE  10 mg Oral Q breakfast  . rivaroxaban  20 mg Oral Q supper  . sodium chloride flush  3 mL Intravenous Once  . thiamine  100 mg Oral Daily   Continuous Infusions:   LOS: 12 days    Time spent: 25 mins.More than 50% of that time was spent in counseling and/or coordination of care.      Shelly Coss, MD [facility] P7/16/2021, 8:08 AM

## 2020-03-27 NOTE — TOC Transition Note (Signed)
Transition of Care [facility]) - CM/SW Discharge Note   Patient Details  Name: [name] MRN: [id number] Date of Birth: 07-15-1941  [facility] CM/SW Contact:  Trula Ore, [place] Phone Number: 03/27/2020, 3:33 PM   Clinical Narrative:     Patient will DC to: [facility] [place] date: 03/27/2020  Family notified: [name]  Transport by: Corey Harold  ?  Per MD patient ready for DC to [facility] with palliative services to follow . RN, patient, patient's family,Roxanne with [facility], and facility notified of DC. Discharge Summary sent to facility. RN given number for report tele#[phone] RM#[phone]. DC packet on chart. Ambulance transport requested for patient.  CSW signing off.  Final next level of care: Skilled Nursing Facility Barriers to Discharge: No Barriers Identified   Patient Goals and CMS Choice Patient states their goals for this hospitalization and ongoing recovery are:: to go to SNF CMS Medicare.gov Compare Post Acute Care list provided to:: Patient Represenative (must comment) (son Mohammed) Choice offered to / list presented to : Adult Children Jeneen Rinks)  Discharge Placement              Patient chooses bed at:  ([facility]) Patient to be transferred to facility by: [place] Name of family member notified: [name] Patient and family notified of of transfer: 03/27/20  Discharge Plan and Services                                     Social Determinants of Health (SDOH) Interventions     Readmission Risk Interventions No flowsheet data found.

## 2020-03-27 NOTE — Discharge Summary (Addendum)
Physician Discharge Summary  JOHNATAN BASKETTE SNK:[id number] DOB: [birth year] DOA: 03/15/2020  PCP: [name], MD  Admit date: 03/15/2020 Discharge date: 03/27/2020  Admitted From: Home Disposition:  Home  Discharge Condition:Stable CODE STATUS:DNR Diet recommendation:Dysphagia 3  Brief/Interim Summary:  Patient is a 79 year old male with history of multiple myeloma on chemotherapy, coronary artery disease, atrial fibrillation , hypertension who presented with generalized weakness and lower extremity pain.  On presentation he had a low-grade temperature, blood pressure was soft.  Chest x-ray did not show any infiltrate. Lactic acid was slightly elevated.  He was started on broad-spectrum biotics and IV fluids for the suspicion of sepsis.  Hospital course remarkable for confusion, persistent respiratory failure.  PCCM, palliative care was consulted.  His respiratory status has significantly improved since last few day.  Patient is medically stable for discharge to skilled nursing facility.  Following problems were addressed during his hospitalization:  Sepsis: Suspected on admission.  Unclear source.  Presented with tachypnea, hypotension, elevated lactic acid level.  UA was unremarkable.  Chest x-ray did not show any infiltrates.  No cellulitis changes.  CT abdomen/pelvis with contrast showed diverticulosis without diverticulitis.  Blood cultures negative so far.  Procalcitonin elevated on presentation.  C. difficile was negative.  Covid testing negative.  Antibiotics have been discontinued now.He has mild  Leucocytosis which cud be associated with steroids.  Acute hypoxic respiratory failure: PCCM also consulted.  CTA chest did not show any PE.  Chest imaging showed opacities in the anterior and posterior right upper lobe suspicion for pulmonary edema/infectious etiology.  Suspicion for aspiration pneumonia ,speech therapy also consulted.On dysphagia 3 diet.  There is also suspicion for  drug-induced pneumonitis from chemo versus progressive multiple myeloma.  Currently on room air. He has been started on tapering dose of oral steroids.  Multiple myeloma: Currently on chemotherapy.  Follows with Dr. Julien Nordmann.  Outpatient follow-up recommended.  Thrombocytopenia/normocytic anemia: Most likely associated with multiple myeloma.  Continue to monitor.  Generalized weakness: PT recommended skilled nursing facility.  Hypertension: Continue current medications.  Paroxysmal A. fib: Continue Xarelto.  Currently rate is well controlled.  Echocardiogram showed ejection fraction 55 to 60%, no regional wall motion abnormality.S/P pacemaker placement  Hyperlipidemia:Continue statin  Conjunctivitis of left eye: resolved.  Goals of care: DNR.  Recommended to follow-up with palliative care as an outpatient.   Discharge Diagnoses:  Active Problems:   Multiple myeloma ([place])   Essential hypertension   Atrial tachycardia/fibrillation   PACEMAKER, [employer] dual-chamber   Coronary artery disease-nonobstructive catheter in October 2011   DNR (do not resuscitate) discussion   Septic shock ([place])   Pressure injury of skin   Immunosuppressed due to chemotherapy   Acquired thrombophilia ([place])   Weakness generalized   Frequent falls   Long term (current) use of anticoagulants   Lactic acidosis   Thrombocytopenia (HCC)   Hypoalbuminemia   Hyperbilirubinemia   Demand ischemia (HCC)   Elevated brain natriuretic peptide (BNP) level   Elevated procalcitonin   Hypokalemia   Anemia of chronic disease   Scalp hematoma   Obesity (BMI 30.0-34.9)   Hypoxia   Palliative care by specialist   Goals of care, counseling/discussion   Advanced care planning/counseling discussion    Discharge Instructions  Discharge Instructions    Diet - low sodium heart healthy   Complete by: As directed    Discharge instructions   Complete by: As directed    1)Take prescribed medications as  instructed 2)Do a CBC and BMP  tests in a week   Discharge wound care:   Complete by: As directed    Continue wound care on the right arm laceration   Increase activity slowly   Complete by: As directed      Allergies as of 03/27/2020      Reactions   Bee Venom Anaphylaxis         Medication List    STOP taking these medications   doxycycline 100 MG capsule Commonly known as: VIBRAMYCIN   lenalidomide 25 MG capsule Commonly known as: Revlimid   vitamin A & D ointment     TAKE these medications   acyclovir 200 MG capsule Commonly known as: ZOVIRAX Take 1 capsule (200 mg total) by mouth 2 (two) times daily.   Alaway 0.025 % ophthalmic solution Generic drug: ketotifen Place 1 drop into both eyes 2 (two) times daily as needed (for dry eyes).   atorvastatin 10 MG tablet Commonly known as: LIPITOR Take 10 mg by mouth daily at 6 PM.   dexamethasone 4 MG tablet Commonly known as: DECADRON 10 tablet p.o. daily weekly start with the first day of the chemotherapy. What changed:   how much to take  how to take this  when to take this  additional instructions   EPINEPHrine 0.3 mg/0.3 mL Soaj injection Commonly known as: EPI-PEN Inject 0.3 mg into the muscle once as needed for anaphylaxis.   finasteride 5 MG tablet Commonly known as: PROSCAR Take 5 mg by mouth daily.   Fish Oil 1200 MG Caps Take 1,200 mg by mouth daily.   furosemide 40 MG tablet Commonly known as: LASIX Take 1 tablet by mouth once daily   losartan 100 MG tablet Commonly known as: COZAAR Take 100 mg by mouth daily.   Magnesium 200 MG Tabs Take 1 tablet (200 mg total) by mouth daily.   melatonin 3 MG Tabs tablet Take 1 tablet (3 mg total) by mouth at bedtime.   multivitamin capsule Take 1 capsule by mouth daily.   mupirocin ointment 2 % Commonly known as: BACTROBAN Apply 1 application topically daily. Apply with dressing change   nitroGLYCERIN 0.4 MG SL tablet Commonly known as:  NITROSTAT PLACE ONE TABLET UNDER THE TONGUE EVERY 5 MINUTES AS NEEDED FOR CHEST PAIN What changed: See the new instructions.   Potassium Chloride ER 20 MEQ Tbcr Take 1 tablet by mouth once daily What changed: how much to take   predniSONE 20 MG tablet Commonly known as: DELTASONE Take 2 tablets (40 mg total) by mouth daily with breakfast for 2 days. Start taking on: March 28, 2020   predniSONE 20 MG tablet Commonly known as: DELTASONE Take 1 tablet (20 mg total) by mouth daily with breakfast for 5 days. Start taking on: March 30, 2020   predniSONE 10 MG tablet Commonly known as: DELTASONE Take 1 tablet (10 mg total) by mouth daily with breakfast for 5 days. Start taking on: April 04, 2020   prochlorperazine 10 MG tablet Commonly known as: COMPAZINE Take 1 tablet (10 mg total) by mouth every 6 (six) hours as needed for nausea or vomiting.   thiamine 100 MG tablet Take 100 mg by mouth daily.   Xarelto 20 MG Tabs tablet Generic drug: rivaroxaban TAKE 1 TABLET BY MOUTH ONCE DAILY WITH SUPPER What changed: See the new instructions.            Discharge Care Instructions  (From admission, onward)         Start  Ordered   03/27/20 0000  Discharge wound care:       Comments: Continue wound care on the right arm laceration   03/27/20 1532          Contact information for after-discharge care    Destination    [facility] .   Service: Skilled Nursing Contact information: [address] [place] [address] [phone]                 Allergies  Allergen Reactions  . Bee Venom Anaphylaxis         Consultations:  PCCM, palliative care   Procedures/Studies: DG Chest 2 View  Result Date: 03/15/2020 CLINICAL DATA:  Status post fall EXAM: CHEST - 2 VIEW COMPARISON:  June 06, 2017 FINDINGS: Mediastinal contour and cardiac silhouette are stable. There is no focal infiltrate, pulmonary edema, or pleural  effusion. Bony structures are stable. IMPRESSION: No active cardiopulmonary disease. Electronically Signed   By: Sherian Rein M.D.   On: 03/15/2020 13:36   CT HEAD WO CONTRAST  Result Date: 03/19/2020 CLINICAL DATA:  Encephalopathy EXAM: CT HEAD WITHOUT CONTRAST TECHNIQUE: Contiguous axial images were obtained from the base of the skull through the vertex without intravenous contrast. COMPARISON:  03/15/2020 FINDINGS: Brain: There is no mass, hemorrhage or extra-axial collection. The size and configuration of the ventricles and extra-axial CSF spaces are normal. There is hypoattenuation of the white matter, most commonly indicating chronic small vessel disease. Vascular: Atherosclerotic calcification of the vertebral and internal carotid arteries at the skull base. No abnormal hyperdensity of the major intracranial arteries or dural venous sinuses. Skull: Unchanged vertex scalp hematoma.  No skull fracture. Sinuses/Orbits: No fluid levels or advanced mucosal thickening of the visualized paranasal sinuses. No mastoid or middle ear effusion. The orbits are normal. IMPRESSION: 1. No acute intracranial abnormality. 2. Unchanged vertex scalp hematoma without skull fracture. 3. Chronic small vessel disease. Electronically Signed   By: Deatra Robinson M.D.   On: 03/19/2020 02:43   CT Head Wo Contrast  Result Date: 03/15/2020 CLINICAL DATA:  Head trauma with headache EXAM: CT HEAD WITHOUT CONTRAST TECHNIQUE: Contiguous axial images were obtained from the base of the skull through the vertex without intravenous contrast. COMPARISON:  April 28, 2013 FINDINGS: Brain: No evidence of acute infarction, hemorrhage, hydrocephalus, extra-axial collection or mass lesion/mass effect. Signs of atrophy and mild chronic microvascular ischemic change Vascular: No hyperdense vessel or unexpected calcification. Skull: Normal. Negative for fracture or focal lesion. Sinuses/Orbits: No acute finding. Other: Scalp hematoma in the  occipital parietal region in the midline. IMPRESSION: 1. No acute intracranial abnormality. 2. Scalp hematoma in the occipital parietal region in the midline of the subcutaneous, superficial scalp. Electronically Signed   By: Donzetta Kohut M.D.   On: 03/15/2020 12:55   CT ANGIO CHEST PE W OR WO CONTRAST  Result Date: 03/19/2020 CLINICAL DATA:  Hypoxemia. EXAM: CT ANGIOGRAPHY CHEST WITH CONTRAST TECHNIQUE: Multidetector CT imaging of the chest was performed using the standard protocol during bolus administration of intravenous contrast. Multiplanar CT image reconstructions and MIPs were obtained to evaluate the vascular anatomy. CONTRAST:  50mL OMNIPAQUE IOHEXOL 350 MG/ML SOLN COMPARISON:  Chest radiograph earlier today. FINDINGS: Cardiovascular: Motion artifact limits subsegmental evaluation. There are no filling defects in the pulmonary arteries to the segmental level to suggest pulmonary embolus. Multi chamber cardiomegaly. Pacemaker in place. Contrast refluxes into the hepatic veins and IVC. Coronary artery calcifications. Small pericardial effusion measures up to 9 mm in  depth. Mediastinum/Nodes: Shotty mediastinal and hilar nodes largest in the right infrahilar region measuring 10 mm. No visualized thyroid nodule. No esophageal wall thickening. Lungs/Pleura: Small to moderate bilateral pleural effusions with compressive atelectasis. Patchy ground-glass opacities in the anterior and posterior right upper lobe. Mild apical predominant emphysema. Scattered smooth septal thickening suggesting pulmonary edema. Motion limits detailed parenchymal assessment. The trachea and central bronchi are grossly patent allowing for motion. Upper Abdomen: Contrast refluxing into the hepatic veins and IVC. Colonic diverticulosis. Tiny hiatal hernia. Musculoskeletal: Multilevel degenerative change in the spine. There are no acute or suspicious osseous abnormalities. Review of the MIP images confirms the above findings.  IMPRESSION: 1. No pulmonary embolus allowing for motion artifact. 2. Multi chamber cardiomegaly with small pericardial effusion. Contrast refluxing into the hepatic veins and IVC consistent with elevated right heart pressures. 3. Small to moderate bilateral pleural effusions with compressive atelectasis. Mild pulmonary edema. 4. Patchy ground-glass opacities in the anterior and posterior right upper lobe may be pulmonary edema or infectious, including atypical viral organisms. 5. Shotty mediastinal and hilar nodes are likely reactive. Aortic Atherosclerosis (ICD10-I70.0) and Emphysema (ICD10-J43.9). Electronically Signed   By: Keith Rake M.D.   On: 03/19/2020 17:49   CT ABDOMEN PELVIS W CONTRAST  Result Date: 03/15/2020 CLINICAL DATA:  Abdominal pain and diarrhea for several days EXAM: CT ABDOMEN AND PELVIS WITH CONTRAST TECHNIQUE: Multidetector CT imaging of the abdomen and pelvis was performed using the standard protocol following bolus administration of intravenous contrast. CONTRAST:  174m OMNIPAQUE IOHEXOL 300 MG/ML  SOLN COMPARISON:  02/08/2018 FINDINGS: Lower chest: Mild bibasilar atelectasis is noted with minimal effusions bilaterally. Hepatobiliary: No focal liver abnormality is seen. No gallstones, gallbladder wall thickening, or biliary dilatation. Pancreas: Unremarkable. No pancreatic ductal dilatation or surrounding inflammatory changes. Spleen: Normal in size without focal abnormality. Adrenals/Urinary Tract: Adrenal glands are within normal limits. Kidneys demonstrate a normal enhancement pattern bilaterally. No renal calculi are seen. Small left renal cyst is noted. Normal excretion of contrast is noted bilaterally. The bladder is well distended. Stomach/Bowel: Diverticulosis of the colon is noted without evidence of diverticulitis. No obstructive or inflammatory changes are seen. The appendix is barium filled. Small bowel and stomach appear within normal limits. Vascular/Lymphatic: Aortic  atherosclerosis. No enlarged abdominal or pelvic lymph nodes. Reproductive: Prostate is unremarkable. Other: No abdominal wall hernia or abnormality. No abdominopelvic ascites. Musculoskeletal: Degenerative changes of the thoracolumbar spine are noted. IMPRESSION: Diverticulosis without diverticulitis. Mild bibasilar atelectasis with small effusions. No other focal abnormality is noted. Electronically Signed   By: MInez CatalinaM.D.   On: 03/15/2020 23:19   DG CHEST PORT 1 VIEW  Result Date: 03/25/2020 CLINICAL DATA:  Respiratory failure EXAM: PORTABLE CHEST 1 VIEW COMPARISON:  03/23/2011 FINDINGS: Left pacer remains in place with leads in the right atrium and right ventricle. Cardiomegaly, vascular congestion. No confluent opacities or effusions. No acute bony abnormality. IMPRESSION: Cardiomegaly, vascular congestion. Electronically Signed   By: KRolm BaptiseM.D.   On: 03/25/2020 08:13   DG CHEST PORT 1 VIEW  Result Date: 03/22/2020 CLINICAL DATA:  hypoxia EXAM: PORTABLE CHEST - 1 VIEW COMPARISON:  03/19/2020 FINDINGS: Improved aeration at the left lung base but some increase in interstitial opacities in the right upper lung. Stable left subclavian dual lead transvenous pacemaker. Heart size upper limits normal for technique. No effusion.  No pneumothorax. Visualized bones unremarkable. IMPRESSION: Worsening right upper lobe interstitial opacities. Electronically Signed   By: DLucrezia EuropeM.D.   On: 03/22/2020 13:48  DG CHEST PORT 1 VIEW  Result Date: 03/19/2020 CLINICAL DATA:  Wheezing.  Hypertension.  Atrial flutter EXAM: PORTABLE CHEST 1 VIEW COMPARISON:  March 15, 2020 FINDINGS: There is cardiomegaly with pulmonary vascularity normal. Pacemaker leads are attached to the right atrium and right ventricle. There is slight bibasilar atelectasis. No evident edema or airspace opacity. There is aortic atherosclerosis. No adenopathy. No bone lesions. There is calcification in the right carotid artery.  IMPRESSION: Stable cardiomegaly. Pacemaker leads attached to right atrium and right ventricle. Areas of mild bibasilar atelectasis. No edema or airspace opacity. Aortic Atherosclerosis (ICD10-I70.0). Foci of right carotid artery calcification noted. Electronically Signed   By: Lowella Grip III M.D.   On: 03/19/2020 13:15   DG Knee Complete 4 Views Left  Result Date: 03/15/2020 CLINICAL DATA:  Status post fall with knee pain. EXAM: LEFT KNEE - COMPLETE 4+ VIEW COMPARISON:  None. FINDINGS: No acute fracture or dislocation is noted. Degenerative joint changes with narrowed joint space and osteophyte formation are noted. IMPRESSION: No acute fracture or dislocation. Electronically Signed   By: Abelardo Diesel M.D.   On: 03/15/2020 13:38   DG Knee Complete 4 Views Right  Result Date: 03/15/2020 CLINICAL DATA:  Status post fall with pain EXAM: RIGHT KNEE - COMPLETE 4+ VIEW COMPARISON:  May 07, 2019 FINDINGS: Degenerative joint changes of the right knee with narrowed joint space and osteophyte formation are identified. There is no acute fracture or dislocation. IMPRESSION: Degenerative joint changes of right knee. No acute fracture or dislocation noted. Electronically Signed   By: Abelardo Diesel M.D.   On: 03/15/2020 13:37   DG Swallowing Func-Speech Pathology  Result Date: 03/24/2020 Objective Swallowing Evaluation: Type of Study: Bedside Swallow Evaluation  Patient Details Name: DEVRON COHICK MRN: [id number] Date of Birth: 1941/05/07 Today's Date: 03/24/2020 Time: SLP Start Time (ACUTE ONLY): 1035 -SLP Stop Time (ACUTE ONLY): 1059 SLP Time Calculation (min) (ACUTE ONLY): 24 min Past Medical History: Past Medical History: Diagnosis Date . Atrial flutter ([facility])   A. s/p prior ablation;  B.  s/p CTI ablation 10/24/10 (Dr. Caryl Comes) . CAD (coronary artery disease)   A.  s/p CFX in the past;   B.  cath 06/2010: LAD 30-40%, D1 50%, OM1 50%, AVCFX stent 30%, dCFX 70-80% (med Rx), mRCA 40%;      C.  Echo 06/2010: EF  60-65%, mod LVH; mild LAE    . Complete heart block (HCC)   a. s/p STJ pacemaker . Cubital tunnel syndrome  . Degenerative disc disease  . Heart block  . HOH (hard of hearing)  . Hx of adenomatous colonic polyps  . Hyperlipidemia  . Hypertension  . Multiple myeloma  . Persistent atrial fibrillation ([place])  . Sleep apnea  . Venous insufficiency  Past Surgical History: Past Surgical History: Procedure Laterality Date . CARDIOVERSION N/A 04/20/2018  Procedure: CARDIOVERSION;  Surgeon: Skeet Latch, MD;  Location: [place];  Service: Cardiovascular;  Laterality: N/A; . COLONOSCOPY  10-07-2009  TA polyp, tics, hems  . CORONARY ANGIOPLASTY WITH STENT PLACEMENT  2005 . PACEMAKER PLACEMENT  2011 . POLYPECTOMY   . PPM GENERATOR CHANGEOUT N/A 09/27/2019  Procedure: PPM GENERATOR CHANGEOUT;  Surgeon: Deboraha Sprang, MD;  Location: [place] CV [facility];  Service: Cardiovascular;  Laterality: N/A; . Right olecranon nerve surgery   . VENTRAL HERNIA REPAIR   HPI: 79 year old man with complex hx including afib, CAD, CHB s/p PCM, ?afib on AC, Multilpe myeloma on velcade, revlimid, decodron presenting  with FTT and worsening dyspnea.  CXR with infiltrates so tx with CAP coverage followed by HCAP coverage, no improvement.  CTA chest showing bilateral small effusions and scattered NSIP-type infitlrates in setting f background emphysema. Per son at bedside patient has been declining pretty rapidly in past 3 weeks.  Patient denies cough, there may be some aspiration type symptoms.  No data recorded Assessment / Plan / Recommendation CHL IP CLINICAL IMPRESSIONS 03/24/2020 Clinical Impression Pt presents with pharyngeal dysphagia characterized by reduced lingual retraction, reduced anterior laryngeal movement, and inconistently incomplete epiglottic inversion. He demonstrated vallecular residue and penetration (PAS 3,5) of thin liquids with ultimate silent aspiration (PAS 8). Frequency of penetration was reduced with reduced bolus sizes  but it was not eliminated. Pt exhibited difficulty with A-P transport of the barium tablet with nectar thick liquids, but less difficulty was noted when it was adminsitered with puree and the oral phase of his swallow was [facility]. A dysphagia 3 diet with nectar thick liquids is recommended at this time. SLP will follow for dysphagia treatment.   SLP Visit Diagnosis Dysphagia, oropharyngeal phase (R13.12) Attention and concentration deficit following -- Frontal lobe and executive function deficit following -- Impact on safety and function Moderate aspiration risk   CHL IP TREATMENT RECOMMENDATION 03/24/2020 Treatment Recommendations Therapy as outlined in treatment plan below   Prognosis 03/24/2020 Prognosis for Safe Diet Advancement Good Barriers to Reach Goals -- Barriers/Prognosis Comment -- CHL IP DIET RECOMMENDATION 03/24/2020 SLP Diet Recommendations Dysphagia 3 (Mech soft) solids;Nectar thick liquid Liquid Administration via Cup;Straw Medication Administration Whole meds with puree Compensations Slow rate;Small sips/bites Postural Changes Seated upright at 90 degrees   CHL IP OTHER RECOMMENDATIONS 03/24/2020 Recommended Consults -- Oral Care Recommendations Oral care BID Other Recommendations --   CHL IP FOLLOW UP RECOMMENDATIONS 03/24/2020 Follow up Recommendations Skilled Nursing facility   [facility] IP FREQUENCY AND DURATION 03/24/2020 Speech Therapy Frequency (ACUTE ONLY) min 2x/week Treatment Duration 2 weeks      CHL IP ORAL PHASE 03/24/2020 Oral Phase WFL Oral - Pudding Teaspoon -- Oral - Pudding Cup -- Oral - Honey Teaspoon -- Oral - Honey Cup -- Oral - Nectar Teaspoon -- Oral - Nectar Cup -- Oral - Nectar Straw -- Oral - Thin Teaspoon -- Oral - Thin Cup -- Oral - Thin Straw -- Oral - Puree -- Oral - Mech Soft -- Oral - Regular -- Oral - Multi-Consistency -- Oral - Pill -- Oral Phase - Comment --  CHL IP PHARYNGEAL PHASE 03/24/2020 Pharyngeal Phase Impaired Pharyngeal- Pudding Teaspoon -- Pharyngeal -- Pharyngeal-  Pudding Cup -- Pharyngeal -- Pharyngeal- Honey Teaspoon -- Pharyngeal -- Pharyngeal- Honey Cup -- Pharyngeal -- Pharyngeal- Nectar Teaspoon -- Pharyngeal -- Pharyngeal- Nectar Cup Reduced anterior laryngeal mobility;Pharyngeal residue - valleculae;Reduced tongue base retraction Pharyngeal -- Pharyngeal- Nectar Straw Reduced anterior laryngeal mobility;Pharyngeal residue - valleculae;Reduced tongue base retraction Pharyngeal -- Pharyngeal- Thin Teaspoon -- Pharyngeal -- Pharyngeal- Thin Cup Reduced anterior laryngeal mobility;Pharyngeal residue - valleculae;Penetration/Aspiration during swallow;Penetration/Apiration after swallow;Reduced tongue base retraction Pharyngeal Material enters airway, remains ABOVE vocal cords and not ejected out;Material enters airway, CONTACTS cords and not ejected out;Material enters airway, passes BELOW cords without attempt by patient to eject out (silent aspiration) Pharyngeal- Thin Straw Reduced anterior laryngeal mobility;Pharyngeal residue - valleculae;Penetration/Aspiration during swallow;Penetration/Apiration after swallow;Reduced tongue base retraction Pharyngeal Material enters airway, remains ABOVE vocal cords and not ejected out;Material enters airway, CONTACTS cords and not ejected out;Material enters airway, passes BELOW cords without attempt by patient to eject out (silent  aspiration) Pharyngeal- Puree Reduced anterior laryngeal mobility;Pharyngeal residue - valleculae;Reduced tongue base retraction Pharyngeal -- Pharyngeal- Mechanical Soft -- Pharyngeal -- Pharyngeal- Regular -- Pharyngeal -- Pharyngeal- Multi-consistency Reduced anterior laryngeal mobility;Pharyngeal residue - valleculae;Reduced tongue base retraction Pharyngeal -- Pharyngeal- Pill Reduced anterior laryngeal mobility;Pharyngeal residue - valleculae;Reduced tongue base retraction Pharyngeal -- Pharyngeal Comment --  CHL IP CERVICAL ESOPHAGEAL PHASE 03/24/2020 Cervical Esophageal Phase WFL Pudding Teaspoon  -- Pudding Cup -- Honey Teaspoon -- Honey Cup -- Nectar Teaspoon -- Nectar Cup -- Nectar Straw -- Thin Teaspoon -- Thin Cup -- Thin Straw -- Puree -- Mechanical Soft -- Regular -- Multi-consistency -- Pill -- Cervical Esophageal Comment -- Shanika I. Hardin Negus, [place], [place] Office number [phone] Pager [phone] [place] Marshall 03/24/2020, 1:51 PM              ECHOCARDIOGRAM COMPLETE  Result Date: 03/16/2020    ECHOCARDIOGRAM REPORT   Patient Name:   NEPHTALI DOCKEN Date of Exam: 03/16/2020 Medical Rec #:  [id number]      Height:       70.0 in Accession #:    [id number]     Weight:       251.5 lb Date of Birth:  12/14/1940      BSA:          2.301 m Patient Age:    23 years       BP:           96/53 mmHg Patient Gender: M              HR:           70 bpm. Exam Location:  Inpatient Procedure: 2D Echo, Cardiac Doppler, Color Doppler and Intracardiac            Opacification Agent Indications:    Hypotension  History:        Patient has prior history of Echocardiogram examinations, most                 recent 06/07/2018. CAD, Pacemaker, Arrythmias:Atrial                 Fibrillation; Risk Factors:Former Smoker, Sleep Apnea and                 Hypertension.  Sonographer:    Clayton Lefort RDCS (AE) Referring Phys: [id number] Lequita Halt  Sonographer Comments: Limited patient mobility. IMPRESSIONS  1. Left ventricular ejection fraction, by estimation, is 55 to 60%. The left ventricle has normal function. The left ventricle has no regional wall motion abnormalities. There is mild left ventricular hypertrophy. Left ventricular diastolic parameters are indeterminate.  2. Right ventricular systolic function is normal. The right ventricular size is normal. There is normal pulmonary artery systolic pressure. The estimated right ventricular systolic pressure is 32.9 mmHg.  3. The mitral valve is grossly normal, mildly calcified annulus. Trivial mitral valve regurgitation.  4. The aortic valve is  tricuspid. Aortic valve regurgitation is not visualized. Mild to moderate aortic valve sclerosis/calcification is present, without any evidence of aortic stenosis. Aortic valve mean gradient measures 3.0 mmHg.  5. The inferior vena cava is dilated in size with >50% respiratory variability, suggesting right atrial pressure of 8 mmHg. FINDINGS  Left Ventricle: Left ventricular ejection fraction, by estimation, is 55 to 60%. The left ventricle has normal function. The left ventricle has no regional wall motion abnormalities. Definity contrast agent was given IV to delineate the left ventricular  endocardial borders. The  left ventricular internal cavity size was normal in size. There is mild left ventricular hypertrophy. Left ventricular diastolic parameters are indeterminate. Right Ventricle: The right ventricular size is normal. No increase in right ventricular wall thickness. Right ventricular systolic function is normal. There is normal pulmonary artery systolic pressure. The tricuspid regurgitant velocity is 2.55 m/s, and  with an assumed right atrial pressure of 8 mmHg, the estimated right ventricular systolic pressure is 97.9 mmHg. Left Atrium: Left atrial size was normal in size. Right Atrium: Right atrial size was normal in size. Pericardium: A small pericardial effusion is present. The pericardial effusion is posterior to the left ventricle. Presence of pericardial fat pad. Mitral Valve: The mitral valve is grossly normal. Mild mitral annular calcification. Trivial mitral valve regurgitation. Tricuspid Valve: The tricuspid valve is grossly normal. Tricuspid valve regurgitation is mild. Aortic Valve: The aortic valve is tricuspid. Aortic valve regurgitation is not visualized. Mild to moderate aortic valve sclerosis/calcification is present, without any evidence of aortic stenosis. Mild aortic valve annular calcification. Aortic valve mean gradient measures 3.0 mmHg. Aortic valve peak gradient measures 5.5 mmHg.  Aortic valve area, by VTI measures 2.96 cm. Pulmonic Valve: The pulmonic valve was not well visualized. Pulmonic valve regurgitation is not visualized. Aorta: The aortic root is normal in size and structure. Venous: The inferior vena cava is dilated in size with greater than 50% respiratory variability, suggesting right atrial pressure of 8 mmHg. IAS/Shunts: No atrial level shunt detected by color flow Doppler. Additional Comments: A pacer wire is visualized.  LEFT VENTRICLE PLAX 2D LVIDd:         4.10 cm LVIDs:         2.90 cm LV PW:         1.20 cm LV IVS:        1.20 cm LVOT diam:     2.20 cm LV SV:         75 LV SV Index:   33 LVOT Area:     3.80 cm  IVC IVC diam: 2.20 cm LEFT ATRIUM           Index       RIGHT ATRIUM           Index LA diam:      4.30 cm 1.87 cm/m  RA Area:     27.80 cm LA Vol (A2C): 62.0 ml 26.95 ml/m RA Volume:   92.90 ml  40.38 ml/m LA Vol (A4C): 52.1 ml 22.64 ml/m  AORTIC VALVE AV Area (Vmax):    2.91 cm AV Area (Vmean):   2.75 cm AV Area (VTI):     2.96 cm AV Vmax:           117.00 cm/s AV Vmean:          82.860 cm/s AV VTI:            0.253 m AV Peak Grad:      5.5 mmHg AV Mean Grad:      3.0 mmHg LVOT Vmax:         89.50 cm/s LVOT Vmean:        59.920 cm/s LVOT VTI:          0.197 m LVOT/AV VTI ratio: 0.78  AORTA Ao Root diam: 3.50 cm Ao Asc diam:  3.70 cm TRICUSPID VALVE TR Peak grad:   26.0 mmHg TR Vmax:        255.00 cm/s  SHUNTS Systemic VTI:  0.20 m Systemic Diam: 2.20 cm  Rozann Lesches MD Electronically signed by Rozann Lesches MD Signature Date/Time: 03/16/2020/11:33:34 AM    Final    DG Femur Min 2 Views Left  Result Date: 03/15/2020 CLINICAL DATA:  Status post fall with weakness of bilateral lower extremities. EXAM: LEFT FEMUR 2 VIEWS COMPARISON:  None. FINDINGS: No acute fracture or dislocation is noted. Degenerative joint changes of the left hip and left knee are identified with narrowed joint space and osteophyte formation. IMPRESSION: No acute fracture or  dislocation. Electronically Signed   By: Abelardo Diesel M.D.   On: 03/15/2020 13:38      Subjective: Patient seen and examined at the bedside today.  Hemodynamically stable for discharge  Discharge Exam: Vitals:   03/27/20 0459 03/27/20 0732  BP: 125/78 134/75  Pulse: 70 70  Resp: 19 20  Temp: 98.6 F (37 C) 98.2 F (36.8 C)  SpO2: 99% 95%   Vitals:   03/26/20 2043 03/27/20 0051 03/27/20 0459 03/27/20 0732  BP: 126/62 125/73 125/78 134/75  Pulse: 70 70 70 70  Resp: _0 Temp: 98.4 F (36.9 C) 98.5 F (36.9 C) 98.6 F (37 C) 98.2 F (36.8 C)  TempSrc: Oral Oral Oral Oral  SpO2: 97% 97% 99% 95%  Weight:   93.4 kg   Height:        General: Pt is alert, awake, not in acute distress Cardiovascular: RRR, S1/S2 +, no rubs, no gallops Respiratory: CTA bilaterally, no wheezing, no rhonchi Abdominal: Soft, NT, ND, bowel sounds + Extremities: no edema, no cyanosis    The results of significant diagnostics from this hospitalization (including imaging, microbiology, ancillary and laboratory) are listed below for reference.     Microbiology: Recent Results (from the past 240 hour(s))  Respiratory Panel by PCR     Status: None   Collection Time: 03/19/20  7:22 PM   Specimen: Nasopharyngeal Swab; Respiratory  Result Value Ref Range Status   Adenovirus NOT DETECTED NOT DETECTED Final   Coronavirus 229E NOT DETECTED NOT DETECTED Final    Comment: (NOTE) The Coronavirus on the Respiratory Panel, DOES NOT test for the novel  Coronavirus (2019 nCoV)    Coronavirus HKU1 NOT DETECTED NOT DETECTED Final   Coronavirus NL63 NOT DETECTED NOT DETECTED Final   Coronavirus OC43 NOT DETECTED NOT DETECTED Final   Metapneumovirus NOT DETECTED NOT DETECTED Final   Rhinovirus / Enterovirus NOT DETECTED NOT DETECTED Final   Influenza A NOT DETECTED NOT DETECTED Final   Influenza B NOT DETECTED NOT DETECTED Final   Parainfluenza Virus 1 NOT DETECTED NOT DETECTED Final    Parainfluenza Virus 2 NOT DETECTED NOT DETECTED Final   Parainfluenza Virus 3 NOT DETECTED NOT DETECTED Final   Parainfluenza Virus 4 NOT DETECTED NOT DETECTED Final   Respiratory Syncytial Virus NOT DETECTED NOT DETECTED Final   Bordetella pertussis NOT DETECTED NOT DETECTED Final   Chlamydophila pneumoniae NOT DETECTED NOT DETECTED Final   Mycoplasma pneumoniae NOT DETECTED NOT DETECTED Final    Comment: Performed at [facility], [address], [place], [place] [address]  MRSA PCR Screening     Status: None   Collection Time: 03/19/20  7:22 PM   Specimen: Nasal Mucosa; Nasopharyngeal  Result Value Ref Range Status   MRSA by PCR NEGATIVE NEGATIVE Final    Comment:        The GeneXpert MRSA Assay (FDA approved for NASAL specimens only), is one component of a comprehensive MRSA colonization surveillance program. It is not  intended to diagnose MRSA infection nor to guide or monitor treatment for MRSA infections. Performed at [facility], [place] [address], [place], [place] [address]   SARS CORONAVIRUS 2 (TAT 6-24 HRS) Nasopharyngeal Nasopharyngeal Swab     Status: None   Collection Time: 03/27/20 11:35 AM   Specimen: Nasopharyngeal Swab  Result Value Ref Range Status   SARS Coronavirus 2 NEGATIVE NEGATIVE Final    Comment: (NOTE) SARS-CoV-2 target nucleic acids are NOT DETECTED.  The SARS-CoV-2 RNA is generally detectable in upper and lower respiratory specimens during the acute phase of infection. Negative results do not preclude SARS-CoV-2 infection, do not rule out co-infections with other pathogens, and should not be used as the sole basis for treatment or other patient management decisions. Negative results must be combined with clinical observations, patient history, and epidemiological information. The expected result is Negative.  Fact Sheet for Patients: SugarRoll.be  Fact Sheet for Healthcare  Providers: https://www.woods-mathews.com/  This test is not yet approved or cleared by the [place] FDA and  has been authorized for detection and/or diagnosis of SARS-CoV-2 by FDA under an Emergency Use Authorization (EUA). This EUA will remain  in effect (meaning this test can be used) for the duration of the COVID-19 declaration under Se ction 564(b)(1) of the Act, 21 U.S.C. section 360bbb-3(b)(1), unless the authorization is terminated or revoked sooner.  Performed at [facility], [place] [address] [place],  [address]      Labs: BNP (last 3 results) Recent Labs    03/15/20 1138 03/22/20 1600  BNP 132.6* 161.4*   Basic Metabolic Panel: Recent Labs  Lab 03/21/20 0348 03/22/20 0343 03/23/20 0837 03/24/20 0358 03/25/20 0459  NA 141 143 141 141 143  K 3.9 4.0 4.2 4.0 4.0  CL 114* 110 106 105 109  CO2 17* 21* _0 GLUCOSE 109* 98 159* 162* 129*  BUN 20 21 26* 33* 32*  CREATININE 0.92 0.89 0.91 0.97 0.74  CALCIUM 8.5* 8.6* 8.8* 9.1 9.1  MG 1.5* 2.0 2.0 2.2 2.5*   Liver Function Tests: Recent Labs  Lab 03/21/20 0348 03/22/20 0343  AST 63* 54*  ALT 39 36  ALKPHOS 42 43  BILITOT 1.5* 1.0  PROT 5.3* 5.9*  ALBUMIN 2.4* 2.4*   No results for input(s): LIPASE, AMYLASE in the last 168 hours. No results for input(s): AMMONIA in the last 168 hours. CBC: Recent Labs  Lab 03/21/20 0348 03/22/20 0343 03/23/20 0837 03/25/20 0459 03/26/20 0803  WBC 10.1 9.3 10.5 15.3* 11.9*  NEUTROABS 5.5 5.2 8.9* 12.3* 7.5  HGB 10.3* 11.1* 11.6* 10.9* 11.1*  HCT 30.1* 32.7* 33.9* 31.7* 32.8*  MCV 95.9 96.5 96.0 98.1 99.1  PLT 260 340 438* 523* 461*   Cardiac Enzymes: No results for input(s): CKTOTAL, CKMB, CKMBINDEX, TROPONINI in the last 168 hours. BNP: Invalid input(s): POCBNP CBG: No results for input(s): GLUCAP in the last 168 hours. D-Dimer No results for input(s): DDIMER in the last 72 hours. Hgb A1c No results for input(s): HGBA1C  in the last 72 hours. Lipid Profile No results for input(s): CHOL, HDL, LDLCALC, TRIG, CHOLHDL, LDLDIRECT in the last 72 hours. Thyroid function studies No results for input(s): TSH, T4TOTAL, T3FREE, THYROIDAB in the last 72 hours.  Invalid input(s): FREET3 Anemia work up No results for input(s): VITAMINB12, FOLATE, FERRITIN, TIBC, IRON, RETICCTPCT in the last 72 hours. Urinalysis    Component Value Date/Time   COLORURINE YELLOW 03/15/2020 [place]  03/15/2020 1445   LABSPEC 1.014 03/15/2020 1445   PHURINE 5.0 03/15/2020 1445   GLUCOSEU NEGATIVE 03/15/2020 1445   HGBUR NEGATIVE 03/15/2020 1445   BILIRUBINUR NEGATIVE 03/15/2020 1445   KETONESUR NEGATIVE 03/15/2020 1445   PROTEINUR NEGATIVE 03/15/2020 1445   UROBILINOGEN 0.2 04/28/2013 1949   NITRITE NEGATIVE 03/15/2020 1445   LEUKOCYTESUR NEGATIVE 03/15/2020 1445   Sepsis Labs Invalid input(s): PROCALCITONIN,  WBC,  LACTICIDVEN Microbiology Recent Results (from the past 240 hour(s))  Respiratory Panel by PCR     Status: None   Collection Time: 03/19/20  7:22 PM   Specimen: Nasopharyngeal Swab; Respiratory  Result Value Ref Range Status   Adenovirus NOT DETECTED NOT DETECTED Final   Coronavirus 229E NOT DETECTED NOT DETECTED Final    Comment: (NOTE) The Coronavirus on the Respiratory Panel, DOES NOT test for the novel  Coronavirus (2019 nCoV)    Coronavirus HKU1 NOT DETECTED NOT DETECTED Final   Coronavirus NL63 NOT DETECTED NOT DETECTED Final   Coronavirus OC43 NOT DETECTED NOT DETECTED Final   Metapneumovirus NOT DETECTED NOT DETECTED Final   Rhinovirus / Enterovirus NOT DETECTED NOT DETECTED Final   Influenza A NOT DETECTED NOT DETECTED Final   Influenza B NOT DETECTED NOT DETECTED Final   Parainfluenza Virus 1 NOT DETECTED NOT DETECTED Final   Parainfluenza Virus 2 NOT DETECTED NOT DETECTED Final   Parainfluenza Virus 3 NOT DETECTED NOT DETECTED Final   Parainfluenza Virus 4 NOT DETECTED NOT DETECTED  Final   Respiratory Syncytial Virus NOT DETECTED NOT DETECTED Final   Bordetella pertussis NOT DETECTED NOT DETECTED Final   Chlamydophila pneumoniae NOT DETECTED NOT DETECTED Final   Mycoplasma pneumoniae NOT DETECTED NOT DETECTED Final    Comment: Performed at [facility], [place]. [address], [place], [place] [address]  MRSA PCR Screening     Status: None   Collection Time: 03/19/20  7:22 PM   Specimen: Nasal Mucosa; Nasopharyngeal  Result Value Ref Range Status   MRSA by PCR NEGATIVE NEGATIVE Final    Comment:        The GeneXpert MRSA Assay (FDA approved for NASAL specimens only), is one component of a comprehensive MRSA colonization surveillance program. It is not intended to diagnose MRSA infection nor to guide or monitor treatment for MRSA infections. Performed at [facility], [place] [address], [place], [place] [address]   SARS CORONAVIRUS 2 (TAT 6-24 HRS) Nasopharyngeal Nasopharyngeal Swab     Status: None   Collection Time: 03/27/20 11:35 AM   Specimen: Nasopharyngeal Swab  Result Value Ref Range Status   SARS Coronavirus 2 NEGATIVE NEGATIVE Final    Comment: (NOTE) SARS-CoV-2 target nucleic acids are NOT DETECTED.  The SARS-CoV-2 RNA is generally detectable in upper and lower respiratory specimens during the acute phase of infection. Negative results do not preclude SARS-CoV-2 infection, do not rule out co-infections with other pathogens, and should not be used as the sole basis for treatment or other patient management decisions. Negative results must be combined with clinical observations, patient history, and epidemiological information. The expected result is Negative.  Fact Sheet for Patients: SugarRoll.be  Fact Sheet for Healthcare Providers: https://www.woods-mathews.com/  This test is not yet approved or cleared by the [place] FDA and  has been authorized for detection and/or diagnosis of SARS-CoV-2  by FDA under an Emergency Use Authorization (EUA). This EUA will remain  in effect (meaning this test can be used) for the duration of the COVID-19 declaration under Se  ction 564(b)(1) of the Act, 21 U.S.C. section 360bbb-3(b)(1), unless the authorization is terminated or revoked sooner.  Performed at [facility], [place] [address], [place], [place] [address]     Please note: You were cared for by a hospitalist during your hospital stay. Once you are discharged, your primary care physician will handle any further medical issues. Please note that NO REFILLS for any discharge medications will be authorized once you are discharged, as it is imperative that you return to your primary care physician (or establish a relationship with a primary care physician if you do not have one) for your post hospital discharge needs so that they can reassess your need for medications and monitor your lab values.    Time coordinating discharge: 40 minutes  SIGNED:   Shelly Coss, MD  [facility] 03/27/2020, 4:31 PM Pager [phone]  If 7PM-7AM, please contact night-coverage [email or web address] Password [email or web address]

## 2020-03-30 DIAGNOSIS — I4819 Other persistent atrial fibrillation: Secondary | ICD-10-CM | POA: Diagnosis not present

## 2020-03-30 DIAGNOSIS — H9190 Unspecified hearing loss, unspecified ear: Secondary | ICD-10-CM | POA: Diagnosis not present

## 2020-03-30 DIAGNOSIS — E785 Hyperlipidemia, unspecified: Secondary | ICD-10-CM | POA: Diagnosis not present

## 2020-03-30 DIAGNOSIS — I872 Venous insufficiency (chronic) (peripheral): Secondary | ICD-10-CM | POA: Diagnosis not present

## 2020-03-30 DIAGNOSIS — C9000 Multiple myeloma not having achieved remission: Secondary | ICD-10-CM | POA: Diagnosis not present

## 2020-03-30 DIAGNOSIS — G4730 Sleep apnea, unspecified: Secondary | ICD-10-CM | POA: Diagnosis not present

## 2020-03-30 DIAGNOSIS — I10 Essential (primary) hypertension: Secondary | ICD-10-CM | POA: Diagnosis not present

## 2020-03-30 DIAGNOSIS — M1990 Unspecified osteoarthritis, unspecified site: Secondary | ICD-10-CM | POA: Diagnosis not present

## 2020-03-31 ENCOUNTER — Inpatient Hospital Stay: Payer: Medicare HMO

## 2020-03-31 ENCOUNTER — Telehealth: Payer: Self-pay | Admitting: Internal Medicine

## 2020-03-31 ENCOUNTER — Inpatient Hospital Stay: Payer: Medicare HMO | Attending: Internal Medicine

## 2020-03-31 NOTE — Telephone Encounter (Signed)
Facility pt is staying at called in to r/s appt from today to next wk .

## 2020-04-01 ENCOUNTER — Telehealth: Payer: Self-pay | Admitting: Medical Oncology

## 2020-04-01 ENCOUNTER — Other Ambulatory Visit: Payer: Self-pay | Admitting: Internal Medicine

## 2020-04-01 DIAGNOSIS — M6281 Muscle weakness (generalized): Secondary | ICD-10-CM | POA: Diagnosis not present

## 2020-04-01 NOTE — Telephone Encounter (Signed)
Disposition-Pt in nursing home getting rehab.He resides in [facility] ( behind [facility]). He has been there almost a week.  Next appt - I told [name] that his appts were cancelled and he is not taking any chemo . I asked her to  call back in 2 weeks with an update and not to expect an appt for several weeks.

## 2020-04-01 NOTE — Telephone Encounter (Signed)
-----   Message from Curt Bears, MD sent at 04/01/2020  1:09 PM EDT ----- Regarding: RE: velcade 7/27 No treatment next week. Thank you. ----- Message ----- From: Larene Beach, [facility] Sent: 04/01/2020  11:33 AM EDT To: Curt Bears, MD, Ardeen Garland, RN Subject: velcade 7/27                                   Hi Dr. Julien Nordmann and Eyvette Cordon,  I saw where Dr. Julien Nordmann mentioned discontinuing patient's chemotherapy on 7/6; however he is scheduled to be treated on 7/27 so I wanted to touch base to see if the plan has changed?  Thanks,  Pharmacy

## 2020-04-02 ENCOUNTER — Telehealth: Payer: Self-pay | Admitting: Medical Oncology

## 2020-04-02 ENCOUNTER — Other Ambulatory Visit: Payer: Self-pay | Admitting: Medical Oncology

## 2020-04-02 NOTE — Telephone Encounter (Signed)
LVM at [facility] that pt appt for July 27th was cancelled.

## 2020-04-03 DIAGNOSIS — Z79899 Other long term (current) drug therapy: Secondary | ICD-10-CM | POA: Diagnosis not present

## 2020-04-03 DIAGNOSIS — M1990 Unspecified osteoarthritis, unspecified site: Secondary | ICD-10-CM | POA: Diagnosis not present

## 2020-04-03 DIAGNOSIS — I10 Essential (primary) hypertension: Secondary | ICD-10-CM | POA: Diagnosis not present

## 2020-04-03 DIAGNOSIS — I4819 Other persistent atrial fibrillation: Secondary | ICD-10-CM | POA: Diagnosis not present

## 2020-04-03 DIAGNOSIS — R52 Pain, unspecified: Secondary | ICD-10-CM | POA: Diagnosis not present

## 2020-04-06 DIAGNOSIS — I4819 Other persistent atrial fibrillation: Secondary | ICD-10-CM | POA: Diagnosis not present

## 2020-04-06 DIAGNOSIS — R05 Cough: Secondary | ICD-10-CM | POA: Diagnosis not present

## 2020-04-06 DIAGNOSIS — R531 Weakness: Secondary | ICD-10-CM | POA: Diagnosis not present

## 2020-04-07 ENCOUNTER — Ambulatory Visit: Payer: Medicare HMO

## 2020-04-07 ENCOUNTER — Other Ambulatory Visit: Payer: Medicare HMO

## 2020-04-07 DIAGNOSIS — R531 Weakness: Secondary | ICD-10-CM | POA: Diagnosis not present

## 2020-04-07 DIAGNOSIS — I4819 Other persistent atrial fibrillation: Secondary | ICD-10-CM | POA: Diagnosis not present

## 2020-04-07 DIAGNOSIS — I10 Essential (primary) hypertension: Secondary | ICD-10-CM | POA: Diagnosis not present

## 2020-04-13 ENCOUNTER — Telehealth: Payer: Self-pay | Admitting: Medical Oncology

## 2020-04-13 DIAGNOSIS — R1312 Dysphagia, oropharyngeal phase: Secondary | ICD-10-CM | POA: Diagnosis not present

## 2020-04-13 DIAGNOSIS — C9000 Multiple myeloma not having achieved remission: Secondary | ICD-10-CM | POA: Diagnosis not present

## 2020-04-13 DIAGNOSIS — J9601 Acute respiratory failure with hypoxia: Secondary | ICD-10-CM | POA: Diagnosis not present

## 2020-04-13 DIAGNOSIS — M6281 Muscle weakness (generalized): Secondary | ICD-10-CM | POA: Diagnosis not present

## 2020-04-13 DIAGNOSIS — R2681 Unsteadiness on feet: Secondary | ICD-10-CM | POA: Diagnosis not present

## 2020-04-13 LAB — CUP PACEART INCLINIC DEVICE CHECK
Date Time Interrogation Session: 20210503165026
Implantable Lead Implant Date: 20111021
Implantable Lead Implant Date: 20111021
Implantable Lead Location: 753859
Implantable Lead Location: 753860
Implantable Pulse Generator Implant Date: 20210115
Lead Channel Pacing Threshold Amplitude: 1 V
Lead Channel Pacing Threshold Pulse Width: 0.5 ms
Lead Channel Sensing Intrinsic Amplitude: 1.6 mV
Lead Channel Sensing Intrinsic Amplitude: 12 mV
Pulse Gen Model: 2272
Pulse Gen Serial Number: 3322631

## 2020-04-13 NOTE — Telephone Encounter (Signed)
When does [name] want to see him ?  He is in rehab getting PT,but not getting up very much. He is not independent in ADLs.  He will be in rehab until the end of august.  Son is aware his tx is cancelled.

## 2020-04-13 NOTE — Telephone Encounter (Signed)
I will see him after discharge from the rehab.  He will not be scheduled for any future chemo.  Thank you.

## 2020-04-13 NOTE — Telephone Encounter (Signed)
Son notified 

## 2020-04-14 DIAGNOSIS — M6281 Muscle weakness (generalized): Secondary | ICD-10-CM | POA: Diagnosis not present

## 2020-04-14 DIAGNOSIS — R2681 Unsteadiness on feet: Secondary | ICD-10-CM | POA: Diagnosis not present

## 2020-04-14 DIAGNOSIS — C9000 Multiple myeloma not having achieved remission: Secondary | ICD-10-CM | POA: Diagnosis not present

## 2020-04-14 DIAGNOSIS — J9601 Acute respiratory failure with hypoxia: Secondary | ICD-10-CM | POA: Diagnosis not present

## 2020-04-14 DIAGNOSIS — R1312 Dysphagia, oropharyngeal phase: Secondary | ICD-10-CM | POA: Diagnosis not present

## 2020-04-15 ENCOUNTER — Telehealth: Payer: Self-pay | Admitting: Medical Oncology

## 2020-04-15 DIAGNOSIS — R2681 Unsteadiness on feet: Secondary | ICD-10-CM | POA: Diagnosis not present

## 2020-04-15 DIAGNOSIS — M6281 Muscle weakness (generalized): Secondary | ICD-10-CM | POA: Diagnosis not present

## 2020-04-15 DIAGNOSIS — C9000 Multiple myeloma not having achieved remission: Secondary | ICD-10-CM | POA: Diagnosis not present

## 2020-04-15 DIAGNOSIS — R1312 Dysphagia, oropharyngeal phase: Secondary | ICD-10-CM | POA: Diagnosis not present

## 2020-04-15 DIAGNOSIS — J9601 Acute respiratory failure with hypoxia: Secondary | ICD-10-CM | POA: Diagnosis not present

## 2020-04-15 NOTE — Telephone Encounter (Signed)
Pt residing at [facility]. He is no longer on the rehab unit. He may be at [facility] long term. The facility would need to transport him to appt. Does he need labs and f/u ?

## 2020-04-15 NOTE — Telephone Encounter (Signed)
er

## 2020-04-15 NOTE — Telephone Encounter (Signed)
Lab (myeloma panel) and follow-up visit in 2 weeks.  Thank you.

## 2020-04-16 ENCOUNTER — Other Ambulatory Visit: Payer: Self-pay | Admitting: Physician Assistant

## 2020-04-16 ENCOUNTER — Telehealth: Payer: Self-pay | Admitting: Internal Medicine

## 2020-04-16 ENCOUNTER — Telehealth: Payer: Self-pay | Admitting: Medical Oncology

## 2020-04-16 DIAGNOSIS — C9000 Multiple myeloma not having achieved remission: Secondary | ICD-10-CM

## 2020-04-16 DIAGNOSIS — R1312 Dysphagia, oropharyngeal phase: Secondary | ICD-10-CM | POA: Diagnosis not present

## 2020-04-16 DIAGNOSIS — M6281 Muscle weakness (generalized): Secondary | ICD-10-CM | POA: Diagnosis not present

## 2020-04-16 DIAGNOSIS — R2681 Unsteadiness on feet: Secondary | ICD-10-CM | POA: Diagnosis not present

## 2020-04-16 DIAGNOSIS — J9601 Acute respiratory failure with hypoxia: Secondary | ICD-10-CM | POA: Diagnosis not present

## 2020-04-16 NOTE — Telephone Encounter (Signed)
Scheduled apt per 8/5 sch msg - left message for patient with appt date and time

## 2020-04-16 NOTE — Telephone Encounter (Signed)
August f/u  appt 8/19 Transportation for appt arranged with [place] at [facility] offered to draw myeloma panel a  week before appt.- Monday or wed so I gave her the lab orders.  On 8/19 -CBC/diff , LDH and cmp will be collected at cancer center.

## 2020-04-17 DIAGNOSIS — R2681 Unsteadiness on feet: Secondary | ICD-10-CM | POA: Diagnosis not present

## 2020-04-17 DIAGNOSIS — R1312 Dysphagia, oropharyngeal phase: Secondary | ICD-10-CM | POA: Diagnosis not present

## 2020-04-17 DIAGNOSIS — M6281 Muscle weakness (generalized): Secondary | ICD-10-CM | POA: Diagnosis not present

## 2020-04-17 DIAGNOSIS — C9000 Multiple myeloma not having achieved remission: Secondary | ICD-10-CM | POA: Diagnosis not present

## 2020-04-17 DIAGNOSIS — J9601 Acute respiratory failure with hypoxia: Secondary | ICD-10-CM | POA: Diagnosis not present

## 2020-04-18 DIAGNOSIS — J9601 Acute respiratory failure with hypoxia: Secondary | ICD-10-CM | POA: Diagnosis not present

## 2020-04-18 DIAGNOSIS — R1312 Dysphagia, oropharyngeal phase: Secondary | ICD-10-CM | POA: Diagnosis not present

## 2020-04-18 DIAGNOSIS — M6281 Muscle weakness (generalized): Secondary | ICD-10-CM | POA: Diagnosis not present

## 2020-04-18 DIAGNOSIS — R2681 Unsteadiness on feet: Secondary | ICD-10-CM | POA: Diagnosis not present

## 2020-04-18 DIAGNOSIS — C9000 Multiple myeloma not having achieved remission: Secondary | ICD-10-CM | POA: Diagnosis not present

## 2020-04-20 DIAGNOSIS — J9601 Acute respiratory failure with hypoxia: Secondary | ICD-10-CM | POA: Diagnosis not present

## 2020-04-20 DIAGNOSIS — R2681 Unsteadiness on feet: Secondary | ICD-10-CM | POA: Diagnosis not present

## 2020-04-20 DIAGNOSIS — C9000 Multiple myeloma not having achieved remission: Secondary | ICD-10-CM | POA: Diagnosis not present

## 2020-04-20 DIAGNOSIS — M6281 Muscle weakness (generalized): Secondary | ICD-10-CM | POA: Diagnosis not present

## 2020-04-20 DIAGNOSIS — R1312 Dysphagia, oropharyngeal phase: Secondary | ICD-10-CM | POA: Diagnosis not present

## 2020-04-21 DIAGNOSIS — C9000 Multiple myeloma not having achieved remission: Secondary | ICD-10-CM | POA: Diagnosis not present

## 2020-04-21 DIAGNOSIS — R1312 Dysphagia, oropharyngeal phase: Secondary | ICD-10-CM | POA: Diagnosis not present

## 2020-04-21 DIAGNOSIS — J9601 Acute respiratory failure with hypoxia: Secondary | ICD-10-CM | POA: Diagnosis not present

## 2020-04-21 DIAGNOSIS — R2681 Unsteadiness on feet: Secondary | ICD-10-CM | POA: Diagnosis not present

## 2020-04-21 DIAGNOSIS — M6281 Muscle weakness (generalized): Secondary | ICD-10-CM | POA: Diagnosis not present

## 2020-04-22 DIAGNOSIS — R1312 Dysphagia, oropharyngeal phase: Secondary | ICD-10-CM | POA: Diagnosis not present

## 2020-04-22 DIAGNOSIS — C9000 Multiple myeloma not having achieved remission: Secondary | ICD-10-CM | POA: Diagnosis not present

## 2020-04-22 DIAGNOSIS — J9601 Acute respiratory failure with hypoxia: Secondary | ICD-10-CM | POA: Diagnosis not present

## 2020-04-22 DIAGNOSIS — M6281 Muscle weakness (generalized): Secondary | ICD-10-CM | POA: Diagnosis not present

## 2020-04-22 DIAGNOSIS — R2681 Unsteadiness on feet: Secondary | ICD-10-CM | POA: Diagnosis not present

## 2020-04-23 ENCOUNTER — Telehealth: Payer: Self-pay | Admitting: Medical Oncology

## 2020-04-23 DIAGNOSIS — J9601 Acute respiratory failure with hypoxia: Secondary | ICD-10-CM | POA: Diagnosis not present

## 2020-04-23 DIAGNOSIS — R2681 Unsteadiness on feet: Secondary | ICD-10-CM | POA: Diagnosis not present

## 2020-04-23 DIAGNOSIS — M6281 Muscle weakness (generalized): Secondary | ICD-10-CM | POA: Diagnosis not present

## 2020-04-23 DIAGNOSIS — C9000 Multiple myeloma not having achieved remission: Secondary | ICD-10-CM | POA: Diagnosis not present

## 2020-04-23 DIAGNOSIS — R1312 Dysphagia, oropharyngeal phase: Secondary | ICD-10-CM | POA: Diagnosis not present

## 2020-04-23 NOTE — Telephone Encounter (Signed)
Confirmed  Appts.  F/U-Myeloma Labs and visit will be on same day to make it easier to transport pt.

## 2020-04-24 ENCOUNTER — Ambulatory Visit (INDEPENDENT_AMBULATORY_CARE_PROVIDER_SITE_OTHER): Payer: Medicare HMO | Admitting: *Deleted

## 2020-04-24 ENCOUNTER — Telehealth: Payer: Self-pay

## 2020-04-24 DIAGNOSIS — I442 Atrioventricular block, complete: Secondary | ICD-10-CM | POA: Diagnosis not present

## 2020-04-24 NOTE — Telephone Encounter (Signed)
Patient daughter called to let us know patient is in a rehab facility and has been for a while and they didn't know that he needed to have his monitor with him so they are going to bring it and place at his bedside

## 2020-04-26 LAB — CUP PACEART REMOTE DEVICE CHECK
Battery Remaining Longevity: 96 mo
Battery Remaining Percentage: 95.5 %
Battery Voltage: 3.01 V
Brady Statistic AP VP Percent: 29 %
Brady Statistic AP VS Percent: 0 %
Brady Statistic AS VP Percent: 71 %
Brady Statistic AS VS Percent: 0 %
Brady Statistic RA Percent Paced: 1 %
Brady Statistic RV Percent Paced: 99 %
Date Time Interrogation Session: 20210813155854
Implantable Lead Implant Date: 20111021
Implantable Lead Implant Date: 20111021
Implantable Lead Location: 753859
Implantable Lead Location: 753860
Implantable Pulse Generator Implant Date: 20210115
Lead Channel Impedance Value: 400 Ohm
Lead Channel Impedance Value: 450 Ohm
Lead Channel Pacing Threshold Amplitude: 1 V
Lead Channel Pacing Threshold Pulse Width: 0.5 ms
Lead Channel Sensing Intrinsic Amplitude: 1.6 mV
Lead Channel Sensing Intrinsic Amplitude: 11.5 mV
Lead Channel Setting Pacing Amplitude: 2.5 V
Lead Channel Setting Pacing Amplitude: 2.5 V
Lead Channel Setting Pacing Pulse Width: 0.5 ms
Lead Channel Setting Sensing Sensitivity: 2.5 mV
Pulse Gen Model: 2272
Pulse Gen Serial Number: 3322631

## 2020-04-28 NOTE — Progress Notes (Signed)
Remote pacemaker transmission.   

## 2020-04-30 ENCOUNTER — Other Ambulatory Visit: Payer: Self-pay

## 2020-04-30 ENCOUNTER — Inpatient Hospital Stay (HOSPITAL_COMMUNITY)
Admission: EM | Admit: 2020-04-30 | Discharge: 2020-05-07 | DRG: 682 | Disposition: A | Discharge status: Hospice | Payer: Medicare HMO | Source: Skilled Nursing Facility | Attending: Internal Medicine | Admitting: Internal Medicine

## 2020-04-30 ENCOUNTER — Observation Stay (HOSPITAL_COMMUNITY): Payer: Medicare HMO

## 2020-04-30 ENCOUNTER — Inpatient Hospital Stay: Payer: Medicare HMO | Admitting: Internal Medicine

## 2020-04-30 ENCOUNTER — Encounter (HOSPITAL_COMMUNITY): Payer: Self-pay

## 2020-04-30 ENCOUNTER — Telehealth: Payer: Self-pay | Admitting: Medical Oncology

## 2020-04-30 ENCOUNTER — Emergency Department (HOSPITAL_COMMUNITY): Payer: Medicare HMO

## 2020-04-30 ENCOUNTER — Inpatient Hospital Stay: Payer: Medicare HMO

## 2020-04-30 DIAGNOSIS — I447 Left bundle-branch block, unspecified: Secondary | ICD-10-CM | POA: Diagnosis present

## 2020-04-30 DIAGNOSIS — R1110 Vomiting, unspecified: Secondary | ICD-10-CM

## 2020-04-30 DIAGNOSIS — I480 Paroxysmal atrial fibrillation: Secondary | ICD-10-CM | POA: Diagnosis present

## 2020-04-30 DIAGNOSIS — N179 Acute kidney failure, unspecified: Secondary | ICD-10-CM | POA: Diagnosis not present

## 2020-04-30 DIAGNOSIS — B9620 Unspecified Escherichia coli [E. coli] as the cause of diseases classified elsewhere: Secondary | ICD-10-CM | POA: Diagnosis present

## 2020-04-30 DIAGNOSIS — I10 Essential (primary) hypertension: Secondary | ICD-10-CM | POA: Diagnosis not present

## 2020-04-30 DIAGNOSIS — F0390 Unspecified dementia without behavioral disturbance: Secondary | ICD-10-CM

## 2020-04-30 DIAGNOSIS — Z79899 Other long term (current) drug therapy: Secondary | ICD-10-CM

## 2020-04-30 DIAGNOSIS — I4819 Other persistent atrial fibrillation: Secondary | ICD-10-CM | POA: Diagnosis present

## 2020-04-30 DIAGNOSIS — E876 Hypokalemia: Secondary | ICD-10-CM | POA: Diagnosis present

## 2020-04-30 DIAGNOSIS — Z950 Presence of cardiac pacemaker: Secondary | ICD-10-CM

## 2020-04-30 DIAGNOSIS — H9190 Unspecified hearing loss, unspecified ear: Secondary | ICD-10-CM | POA: Diagnosis present

## 2020-04-30 DIAGNOSIS — Z87891 Personal history of nicotine dependence: Secondary | ICD-10-CM

## 2020-04-30 DIAGNOSIS — R410 Disorientation, unspecified: Secondary | ICD-10-CM

## 2020-04-30 DIAGNOSIS — R531 Weakness: Secondary | ICD-10-CM | POA: Diagnosis not present

## 2020-04-30 DIAGNOSIS — S2232XD Fracture of one rib, left side, subsequent encounter for fracture with routine healing: Secondary | ICD-10-CM

## 2020-04-30 DIAGNOSIS — C9000 Multiple myeloma not having achieved remission: Secondary | ICD-10-CM | POA: Diagnosis present

## 2020-04-30 DIAGNOSIS — R627 Adult failure to thrive: Secondary | ICD-10-CM

## 2020-04-30 DIAGNOSIS — Z91030 Bee allergy status: Secondary | ICD-10-CM

## 2020-04-30 DIAGNOSIS — S2232XA Fracture of one rib, left side, initial encounter for closed fracture: Secondary | ICD-10-CM | POA: Diagnosis not present

## 2020-04-30 DIAGNOSIS — E785 Hyperlipidemia, unspecified: Secondary | ICD-10-CM

## 2020-04-30 DIAGNOSIS — E162 Hypoglycemia, unspecified: Secondary | ICD-10-CM | POA: Diagnosis present

## 2020-04-30 DIAGNOSIS — N401 Enlarged prostate with lower urinary tract symptoms: Secondary | ICD-10-CM | POA: Diagnosis present

## 2020-04-30 DIAGNOSIS — I442 Atrioventricular block, complete: Secondary | ICD-10-CM | POA: Diagnosis present

## 2020-04-30 DIAGNOSIS — E872 Acidosis: Secondary | ICD-10-CM | POA: Diagnosis present

## 2020-04-30 DIAGNOSIS — G9341 Metabolic encephalopathy: Secondary | ICD-10-CM | POA: Diagnosis present

## 2020-04-30 DIAGNOSIS — I952 Hypotension due to drugs: Secondary | ICD-10-CM | POA: Diagnosis present

## 2020-04-30 DIAGNOSIS — F05 Delirium due to known physiological condition: Secondary | ICD-10-CM

## 2020-04-30 DIAGNOSIS — I2510 Atherosclerotic heart disease of native coronary artery without angina pectoris: Secondary | ICD-10-CM | POA: Diagnosis present

## 2020-04-30 DIAGNOSIS — Z20822 Contact with and (suspected) exposure to covid-19: Secondary | ICD-10-CM | POA: Diagnosis present

## 2020-04-30 DIAGNOSIS — R338 Other retention of urine: Secondary | ICD-10-CM | POA: Diagnosis present

## 2020-04-30 DIAGNOSIS — Z66 Do not resuscitate: Secondary | ICD-10-CM | POA: Diagnosis present

## 2020-04-30 DIAGNOSIS — Z8249 Family history of ischemic heart disease and other diseases of the circulatory system: Secondary | ICD-10-CM

## 2020-04-30 DIAGNOSIS — R630 Anorexia: Secondary | ICD-10-CM | POA: Diagnosis present

## 2020-04-30 DIAGNOSIS — Z7901 Long term (current) use of anticoagulants: Secondary | ICD-10-CM

## 2020-04-30 DIAGNOSIS — N390 Urinary tract infection, site not specified: Secondary | ICD-10-CM | POA: Insufficient documentation

## 2020-04-30 DIAGNOSIS — I4892 Unspecified atrial flutter: Secondary | ICD-10-CM | POA: Diagnosis present

## 2020-04-30 DIAGNOSIS — Z6831 Body mass index (BMI) 31.0-31.9, adult: Secondary | ICD-10-CM

## 2020-04-30 DIAGNOSIS — R5381 Other malaise: Secondary | ICD-10-CM | POA: Diagnosis not present

## 2020-04-30 DIAGNOSIS — G4733 Obstructive sleep apnea (adult) (pediatric): Secondary | ICD-10-CM | POA: Diagnosis present

## 2020-04-30 DIAGNOSIS — Z515 Encounter for palliative care: Secondary | ICD-10-CM | POA: Diagnosis not present

## 2020-04-30 LAB — CBC
HCT: 32.8 % — ABNORMAL LOW (ref 39.0–52.0)
Hemoglobin: 10.3 g/dL — ABNORMAL LOW (ref 13.0–17.0)
MCH: 32.5 pg (ref 26.0–34.0)
MCHC: 31.4 g/dL (ref 30.0–36.0)
MCV: 103.5 fL — ABNORMAL HIGH (ref 80.0–100.0)
Platelets: 204 10*3/uL (ref 150–400)
RBC: 3.17 MIL/uL — ABNORMAL LOW (ref 4.22–5.81)
RDW: 14 % (ref 11.5–15.5)
WBC: 6.9 10*3/uL (ref 4.0–10.5)
nRBC: 0 % (ref 0.0–0.2)

## 2020-04-30 LAB — LACTIC ACID, PLASMA
Lactic Acid, Venous: 3.3 mmol/L (ref 0.5–1.9)
Lactic Acid, Venous: 2.1 mmol/L (ref 0.5–1.9)

## 2020-04-30 LAB — HEPATIC FUNCTION PANEL
ALT: 9 U/L (ref 0–44)
AST: 21 U/L (ref 15–41)
Albumin: 3 g/dL — ABNORMAL LOW (ref 3.5–5.0)
Alkaline Phosphatase: 62 U/L (ref 38–126)
Bilirubin, Direct: 0.3 mg/dL — ABNORMAL HIGH (ref 0.0–0.2)
Indirect Bilirubin: 1.3 mg/dL — ABNORMAL HIGH (ref 0.3–0.9)
Total Bilirubin: 1.6 mg/dL — ABNORMAL HIGH (ref 0.3–1.2)
Total Protein: 6.1 g/dL — ABNORMAL LOW (ref 6.5–8.1)

## 2020-04-30 LAB — BASIC METABOLIC PANEL
Anion gap: 16 — ABNORMAL HIGH (ref 5–15)
BUN: 18 mg/dL (ref 8–23)
CO2: 19 mmol/L — ABNORMAL LOW (ref 22–32)
Calcium: 9.2 mg/dL (ref 8.9–10.3)
Chloride: 101 mmol/L (ref 98–111)
Creatinine, Ser: 2.31 mg/dL — ABNORMAL HIGH (ref 0.61–1.24)
GFR calc Af Amer: 30 mL/min — ABNORMAL LOW (ref >60)
GFR calc non Af Amer: 26 mL/min — ABNORMAL LOW (ref >60)
Glucose, Bld: 104 mg/dL — ABNORMAL HIGH (ref 70–99)
Potassium: 3.6 mmol/L (ref 3.5–5.1)
Sodium: 136 mmol/L (ref 135–145)

## 2020-04-30 LAB — URINALYSIS, ROUTINE W REFLEX MICROSCOPIC
Bilirubin Urine: NEGATIVE
Glucose, UA: NEGATIVE mg/dL
Ketones, ur: 5 mg/dL — AB
Nitrite: NEGATIVE
Protein, ur: 100 mg/dL — AB
RBC / HPF: >50 RBC/hpf — ABNORMAL HIGH (ref 0–5)
Specific Gravity, Urine: 1.01 (ref 1.005–1.030)
WBC, UA: >50 WBC/hpf — ABNORMAL HIGH (ref 0–5)
pH: 5 (ref 5.0–8.0)

## 2020-04-30 LAB — CBG MONITORING, ED: Glucose-Capillary: 98 mg/dL (ref 70–99)

## 2020-04-30 LAB — MAGNESIUM: Magnesium: 2 mg/dL (ref 1.7–2.4)

## 2020-04-30 LAB — SARS CORONAVIRUS 2 BY RT PCR (HOSPITAL ORDER, PERFORMED IN ~~LOC~~ HOSPITAL LAB): SARS Coronavirus 2: NEGATIVE

## 2020-04-30 LAB — PROTIME-INR
INR: 2.6 — ABNORMAL HIGH (ref 0.8–1.2)
Prothrombin Time: 27.1 seconds — ABNORMAL HIGH (ref 11.4–15.2)

## 2020-04-30 MED ORDER — SODIUM CHLORIDE 0.9 % IV SOLN: 1.0000 g | INTRAVENOUS | Status: DC

## 2020-04-30 MED ORDER — ATORVASTATIN CALCIUM 10 MG PO TABS
10.0000 mg | ORAL_TABLET | Freq: Every day | ORAL | Status: DC
  Administered 2020-04-30 – 2020-05-05 (×5): 10 mg via ORAL
  Filled 2020-04-30 (×6): qty 1

## 2020-04-30 MED ORDER — LACTATED RINGERS IV BOLUS
1000.0000 mL | Freq: Once | INTRAVENOUS | Status: AC
  Administered 2020-04-30: 1000 mL via INTRAVENOUS

## 2020-04-30 MED ORDER — RIVAROXABAN 15 MG PO TABS
15.0000 mg | ORAL_TABLET | Freq: Every day | ORAL | Status: DC
  Administered 2020-04-30: 15 mg via ORAL
  Filled 2020-04-30 (×2): qty 1

## 2020-04-30 MED ORDER — VANCOMYCIN HCL 2000 MG/400ML IV SOLN
2000.0000 mg | Freq: Once | INTRAVENOUS | Status: AC
  Administered 2020-04-30: 2000 mg via INTRAVENOUS
  Filled 2020-04-30: qty 400

## 2020-04-30 MED ORDER — ACETAMINOPHEN 650 MG RE SUPP: 650.0000 mg | Freq: Four times a day (QID) | RECTAL | Status: DC | PRN

## 2020-04-30 MED ORDER — SODIUM CHLORIDE 0.9 % IV SOLN: INTRAVENOUS | Status: AC

## 2020-04-30 MED ORDER — SODIUM CHLORIDE 0.9 % IV SOLN
2.0000 g | Freq: Once | INTRAVENOUS | Status: AC
  Administered 2020-04-30: 2 g via INTRAVENOUS
  Filled 2020-04-30: qty 2

## 2020-04-30 MED ORDER — ACETAMINOPHEN 325 MG PO TABS: 650.0000 mg | ORAL_TABLET | Freq: Four times a day (QID) | ORAL | Status: DC | PRN

## 2020-04-30 MED ORDER — MELATONIN 3 MG PO TABS
3.0000 mg | ORAL_TABLET | Freq: Every day | ORAL | Status: DC
  Administered 2020-04-30 – 2020-05-04 (×5): 3 mg via ORAL
  Filled 2020-04-30 (×6): qty 1

## 2020-04-30 MED ORDER — SODIUM CHLORIDE 0.9 % IV SOLN
1.0000 g | INTRAVENOUS | Status: DC
  Administered 2020-05-01 – 2020-05-03 (×3): 1 g via INTRAVENOUS
  Filled 2020-04-30 (×3): qty 10

## 2020-04-30 MED ORDER — SODIUM CHLORIDE 0.9% FLUSH
3.0000 mL | Freq: Two times a day (BID) | INTRAVENOUS | Status: DC
  Administered 2020-05-01 – 2020-05-06 (×10): 3 mL via INTRAVENOUS

## 2020-04-30 NOTE — ED Notes (Signed)
B/p 59/31 in triage siting in w/c up to 92/49 lying in bed

## 2020-04-30 NOTE — H&P (Signed)
History and Physical    [name] LSL:[id number] DOB: [birth year] DOA: 04/30/2020  PCP: [name], MD  Patient coming from: SNF  I have personally briefly reviewed patient's old medical records in [place]  Chief Complaint: Acute encephalopathy  HPI: [name] is a 79 y.o. 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  History is limited from patient due to dementia with acute delirium therefore is otherwise supplemented from daughter at bedside, EDP, and chart review.  Patient was recently hospitalized from 03/15/2020-03/27/2020 initially for sepsis due to unknown etiology. No obvious infectious source was found. Hospital course was complicated by acute respiratory failure with hypoxia due to aspiration pneumonia versus drug-induced pneumonitis from chemotherapy. He was eventually discharged to SNF.  Daughter states that patient was going to be transported from his SNF to an oncology appointment however on their arrival he appeared unresponsive.  Daughter was called and she says on his arrival he appeared more confused than usual.  She says that he has not been eating well over the last couple of weeks.  She says he complained of feeling generally ill but no specific complaints.  He was brought to the ED for further evaluation.  ED Course:  Initial vitals showed BP 62/31, pulse 60, RR 20, temp 97.5 Fahrenheit, SPO2 100% on room air.  Labs notable for creatinine 2.31 (0.74 on 03/25/2020), sodium 136, potassium 3.6, bicarb 19, BUN 18, WBC 6.9, hemoglobin 10.3, platelets 204,000, magnesium 2.0, AST 21, ALT 9, alk phos 62, total bilirubin 1.6, lactic acid 3.3 >> 2.1.  Urinalysis showed negative nitrites, moderate leukocytes, >50 RBCs and WBCs/hpf, many bacteria on  microscopy.  Blood and urine cultures were obtained and pending.  SARS-CoV-2 PCR is negative.  Portable chest x-ray shows left-sided PPM in place, low lung volumes with atelectasis and subacute appearing nondisplaced fracture of the posterior left fourth rib.  Patient was given 2 L LR and started on empiric antibiotics with IV vancomycin and cefepime.  Blood pressure improved and the hospitalist service was consulted to admit for further evaluation management.  Review of Systems:  Unable to obtain full review of systems due to dementia with acute delirium.   Past Medical History:  Diagnosis Date  . Atrial flutter ([place])    A. s/p prior ablation;  B.  s/p CTI ablation 10/24/10 (Dr. Caryl Comes)  . CAD (coronary artery disease)    A.  s/p CFX in the past;   B.  cath 06/2010: LAD 30-40%, D1 50%, OM1 50%, AVCFX stent 30%, dCFX 70-80% (med Rx), mRCA 40%;      C.  Echo 06/2010: EF 60-65%, mod LVH; mild LAE     . Complete heart block (HCC)    a. s/p STJ pacemaker  . Cubital tunnel syndrome   . Degenerative disc disease   . Heart block   . HOH (hard of hearing)   . Hx of adenomatous colonic polyps   . Hyperlipidemia   . Hypertension   . Multiple myeloma   . Persistent atrial fibrillation ([place])   . Sleep apnea   . Venous insufficiency     Past Surgical History:  Procedure Laterality Date  . CARDIOVERSION N/A 04/20/2018   Procedure: CARDIOVERSION;  Surgeon: Skeet Latch, MD;  Location: [facility];  Service: Cardiovascular;  Laterality: N/A;  . COLONOSCOPY  10-07-2009   TA polyp, tics, hems   . CORONARY ANGIOPLASTY WITH STENT PLACEMENT  2005  . PACEMAKER PLACEMENT  2011  . POLYPECTOMY    . PPM GENERATOR CHANGEOUT N/A 09/27/2019   Procedure: PPM GENERATOR CHANGEOUT;  Surgeon: Deboraha Sprang, MD;  Location: [facility];  Service: Cardiovascular;  Laterality: N/A;  . Right olecranon nerve surgery    . VENTRAL HERNIA REPAIR      Social History:  reports that he quit smoking about  15 years ago. His smoking use included cigarettes. He has a 49.00 pack-year smoking history. He has quit using smokeless tobacco. He reports current alcohol use of about 28.0 standard drinks of alcohol per week. He reports that he does not use drugs.  Allergies  Allergen Reactions  . Bee Venom Anaphylaxis         Family History  Problem Relation Age of Onset  . Heart disease Father   . Cancer Father        prostate  . Heart disease Brother   . Colon cancer Neg Hx      Prior to Admission medications   Medication Sig Start Date End Date Taking? Authorizing Provider  acyclovir (ZOVIRAX) 200 MG capsule Take 1 capsule (200 mg total) by mouth 2 (two) times daily. 01/02/20  Yes Curt Bears, MD  atorvastatin (LIPITOR) 10 MG tablet Take 10 mg by mouth daily at 6 PM.    Yes [provider]  EPINEPHrine 0.3 mg/0.3 mL IJ SOAJ injection Inject 0.3 mg into the muscle once as needed for anaphylaxis. 04/25/17  Yes [provider]  finasteride (PROSCAR) 5 MG tablet Take 5 mg by mouth daily.   Yes [provider]  furosemide (LASIX) 40 MG tablet Take 1 tablet by mouth once daily Patient taking differently: Take 40 mg by mouth daily.  08/31/19  Yes Baldwin Jamaica, PA-C  ketotifen (ALAWAY) 0.025 % ophthalmic solution Place 1 drop into both eyes 2 (two) times daily as needed (for dry eyes).    Yes [provider]  losartan (COZAAR) 100 MG tablet Take 100 mg by mouth daily.   Yes [provider]  melatonin 3 MG TABS tablet Take 1 tablet (3 mg total) by mouth at bedtime. 03/27/20  Yes Shelly Coss, MD  Multiple Vitamin (MULTIVITAMIN) capsule Take 1 capsule by mouth daily.     Yes [provider]  mupirocin ointment (BACTROBAN) 2 % Apply 1 application topically daily. Apply with dressing change 02/15/20  Yes [provider]  nitroGLYCERIN (NITROSTAT) 0.4 MG SL tablet PLACE ONE TABLET UNDER THE TONGUE EVERY 5 MINUTES AS NEEDED FOR CHEST  PAIN Patient taking differently: Place 0.4 mg under the tongue every 5 (five) minutes as needed for chest pain.  07/19/18  Yes Josue Hector, MD  Potassium Chloride ER 20 MEQ TBCR Take 1 tablet by mouth once daily Patient taking differently: Take 20 mEq by mouth daily.  07/04/19  Yes Baldwin Jamaica, PA-C  prochlorperazine (COMPAZINE) 10 MG tablet Take 1 tablet (10 mg total) by mouth every 6 (six) hours as needed for nausea or vomiting. 01/02/20  Yes Curt Bears, MD  XARELTO 20 MG TABS tablet TAKE 1 TABLET BY MOUTH ONCE DAILY WITH SUPPER Patient taking differently: Take 20 mg by mouth daily with supper.  02/03/20  Yes Deboraha Sprang, MD  Magnesium 200 MG TABS Take 1 tablet (200 mg total) by mouth daily. Patient not taking: Reported on 04/30/2020 05/08/18  Sherran Needs, NP    Physical Exam: Vitals:   04/30/20 1715 04/30/20 1730 04/30/20 1800 04/30/20 1830  BP: 117/67 114/63 123/60 (!) 119/58  Pulse: 69 70 70 70  Resp: _0 Temp:      TempSrc:      SpO2: 100% 100% 100% 100%  Weight:       Constitutional: Chronically ill-appearing man resting supine in bed, NAD, calm, comfortable Eyes: PERRL, lids and conjunctivae normal ENMT: Mucous membranes are dry. Posterior pharynx clear of any exudate or lesions.very hard of hearing. Neck: normal, supple, no masses. Respiratory: clear to auscultation anteriorly. Normal respiratory effort. No accessory muscle use.  Cardiovascular: Regular rate and rhythm, no murmurs / rubs / gallops. No extremity edema. 2+ pedal pulses. Abdomen: no tenderness, no masses palpated. No hepatosplenomegaly. Bowel sounds positive.  Musculoskeletal: no clubbing / cyanosis. No joint deformity upper and lower extremities. Good ROM, no contractures. Normal muscle tone.  Skin: no rashes, lesions, ulcers. No induration Neurologic: Hard of hearing otherwise CN 2-12 grossly intact. Sensation intact, Strength 5/5 in all 4.  Psychiatric: Awake and alert, can  recognize daughter at bedside with prompting otherwise disoriented.   Labs on Admission: I have personally reviewed following labs and imaging studies  CBC: Recent Labs  Lab 04/30/20 1657  WBC 6.9  HGB 10.3*  HCT 32.8*  MCV 103.5*  PLT 106   Basic Metabolic Panel: Recent Labs  Lab 04/30/20 1257  NA 136  K 3.6  CL 101  CO2 19*  GLUCOSE 104*  BUN 18  CREATININE 2.31*  CALCIUM 9.2  MG 2.0   GFR: Estimated Creatinine Clearance: 31 mL/min (A) (by C-G formula based on SCr of 2.31 mg/dL (H)). Liver Function Tests: Recent Labs  Lab 04/30/20 1257  AST 21  ALT 9  ALKPHOS 62  BILITOT 1.6*  PROT 6.1*  ALBUMIN 3.0*   No results for input(s): LIPASE, AMYLASE in the last 168 hours. No results for input(s): AMMONIA in the last 168 hours. Coagulation Profile: Recent Labs  Lab 04/30/20 1257  INR 2.6*   Cardiac Enzymes: No results for input(s): CKTOTAL, CKMB, CKMBINDEX, TROPONINI in the last 168 hours. BNP (last 3 results) No results for input(s): PROBNP in the last 8760 hours. HbA1C: No results for input(s): HGBA1C in the last 72 hours. CBG: Recent Labs  Lab 04/30/20 1235  GLUCAP 98   Lipid Profile: No results for input(s): CHOL, HDL, LDLCALC, TRIG, CHOLHDL, LDLDIRECT in the last 72 hours. Thyroid Function Tests: No results for input(s): TSH, T4TOTAL, FREET4, T3FREE, THYROIDAB in the last 72 hours. Anemia Panel: No results for input(s): VITAMINB12, FOLATE, FERRITIN, TIBC, IRON, RETICCTPCT in the last 72 hours. Urine analysis:    Component Value Date/Time   COLORURINE YELLOW 04/30/2020 1705   APPEARANCEUR CLOUDY (A) 04/30/2020 1705   LABSPEC 1.010 04/30/2020 1705   PHURINE 5.0 04/30/2020 1705   GLUCOSEU NEGATIVE 04/30/2020 1705   HGBUR MODERATE (A) 04/30/2020 1705   BILIRUBINUR NEGATIVE 04/30/2020 1705   KETONESUR 5 (A) 04/30/2020 1705   PROTEINUR 100 (A) 04/30/2020 1705   UROBILINOGEN 0.2 04/28/2013 1949   NITRITE NEGATIVE 04/30/2020 1705   LEUKOCYTESUR  MODERATE (A) 04/30/2020 1705    Radiological Exams on Admission: DG Chest 1 View  Result Date: 04/30/2020 CLINICAL DATA:  Hypotension EXAM: CHEST  1 VIEW COMPARISON:  03/25/2020 FINDINGS: Left-sided implanted cardiac device, unchanged. The heart size and mediastinal contours are within normal limits. Atherosclerotic calcification of the aortic knob. Low lung  volumes with streaky left basilar opacity, favoring atelectasis. Lungs appear otherwise clear. No pleural fluid collection. No pneumothorax. Subacute appearing nondisplaced fracture of the posterior left fourth rib. IMPRESSION: 1. Low lung volumes with streaky left basilar opacity, favoring atelectasis. 2. Subacute appearing nondisplaced fracture of the posterior left fourth rib. Electronically Signed   By: Davina Poke D.O.   On: 04/30/2020 13:20    EKG: Independently reviewed.  V-paced rhythm, not significantly changed when compared to prior.  Assessment/Plan Principal Problem:   AKI (acute kidney injury) ([place]) Active Problems:   Multiple myeloma (HCC)   Obstructive sleep apnea   Essential hypertension   Paroxysmal atrial fibrillation (HCC)   Acute lower UTI   Hyperlipidemia   Delirium with dementia  [name] is a 79 y.o. male with medical history significant for paroxysmal A. fib/flutter on Xarelto, CHB s/p PPM, CAD, multiple myeloma, hypertension, hyperlipidemia, BPH, OSA, very hard of hearing, and dementia who is admitted with AKI.  Acute kidney injury: Likely multifactorial from poor oral intake, hypotensive episode, medications, and UTI. -Obtain renal ultrasound -Check urine studies -Continue IV fluid hydration overnight -Hold home losartan, Lasix, acyclovir, and finasteride for now  UTI: -Continue IV ceftriaxone -Follow-up urine and blood cultures  Dementia with acute delirium: In setting of UTI/AKI.  Continue management as above, delirium precautions.  Paroxysmal atrial fibrillation CHB s/p PPM: Remains  in paced rhythm.  CHA2DS2-VASc score is 5.  Continue Xarelto anticoagulation.  CAD: Currently stable.  Continue Xarelto and atorvastatin.  Hypertension: Holding losartan, Lasix due to hypotension on arrival.  Hyperlipidemia: Continue atorvastatin.  Generalized weakness Subacute nondisplaced left fourth rib fracture: Continue fall precautions, PT/OT eval, encourage incentive spirometer.  BPH: Holding finasteride due to hypotension.  OSA: Continue CPAP nightly as tolerated.  DVT prophylaxis: Xarelto Code Status: DNR, confirmed with daughter Family Communication: Discussed with patient's daughter at bedside Disposition Plan: From SNF and likely DC to SNF pending further culture data and ability to transition to oral antibiotics. Consults called: None Admission status:  Status is: Observation  The patient remains OBS appropriate and will d/c before 2 midnights.  Dispo: The patient is from: SNF              Anticipated d/c is to: SNF              Anticipated d/c date is: 1 day              Patient currently is not medically stable to d/c.   Zada Finders MD [facility]  If 7PM-7AM, please contact night-coverage [email or web address]  04/30/2020, 7:24 PM

## 2020-04-30 NOTE — Telephone Encounter (Signed)
Pt in [facility] ED - he was found unresponsive.

## 2020-04-30 NOTE — Progress Notes (Signed)
New order for CPAP QHS. Pt has mitts and restraints on, so CPAP not set up at this time. RT will continue to monitor.

## 2020-04-30 NOTE — Telephone Encounter (Signed)
Cyril Mourning can see him in the morning if there is anything related to [place].  Thank you.

## 2020-04-30 NOTE — Progress Notes (Signed)
TRH was called for admission on this 79yo patient who presented from a SNF with fatigue.  Per triage notes, patient had no complaint other than being dissatisfied with his facility.  He was found to be profoundly hypotensive, improved with IVF.  He was also given empiric antibiotics for possible sepsis.  However, he has not had a CBC/BMP; lactate has not been repeated; UA not obtained - his evaluation is incomplete at this time.  He does not currently have SIRS criteria suggestive of sepsis and so this instead could simply be profound dehydration (perhaps in the setting of diminished PO intake given that he is unhappy at his facility).  Once the evaluation is complete, I have requested that the patient be re-paged.   Carlyon Shadow, M.D.

## 2020-04-30 NOTE — ED Triage Notes (Signed)
Patient arrived by [facility] from [facility] and told family he wants to go to hospital. No complaints, doesn't like his SNF. Patient was to see cancer MD today but didn't go.

## 2020-04-30 NOTE — ED Notes (Signed)
Attempted report. 6E refused.

## 2020-04-30 NOTE — ED Provider Notes (Signed)
Transfer of Care Note I assumed care of [name] on 04/30/2020 at 1600.  Briefly, [name] is a 79 y.o. male who:  Has a h/o Dementia w/ MM on CTX p/w weakness and hypotension.  SBP 60s on arrival.  S/p 3L and abx (Vanc/Cefepime)  F/u Bmp, UA, CBC w/ likely admit  The plan includes:  F/u labs and likely admit.   Please refer to the original provider's note for additional information regarding the care of [name].  Reassessment: Vital Signs:  The most current vitals were  Vitals:   04/30/20 1700 04/30/20 1715  BP: (!) 107/56 117/67  Pulse: 70 69  Resp: (!) 23 17  Temp:    SpO2: 99% 100%    Hemodynamics:  The patient is hemodynamically stable. Mental Status:  The patient is Alert  Additional MDM: Labs notable for AKI with creatinine greater than 2 and urinalysis consistent with UTI.  Patient symptoms likely secondary to urosepsis with prerenal AKI.  Evaluated the patient at bedside with his daughter.  Patient is very confused however daughter states that this is his baseline.  Patient has already received vancomycin as well as cefepime from the previous team.  Patient will need admission for likely urosepsis and AKI.  Discussed the case with the admitting hospitalist who evaluated the patient agreed admission to their service.  Patient was admitted to hospitalist service in stable condition without further events.  Patient had no further events in the emergency department during the duration of my shift.   Silvestre Gunner, MD 04/30/20 D[place]ula Perfect    Drenda Freeze, MD 05/04/20 1600

## 2020-04-30 NOTE — Progress Notes (Signed)
Patient arrived to unit [place] bed 12 from emergency room.Assisted patient to bed by nursing staff.Patient arrived to this unit in bilateral wrist restraints per charge nurse [place] this unit protocol is to discontinue restraints and place green mitts on patient.Wrist restraints discontinued per this unit protocol and green mitts applied.Patient placed on low bed and floor matts on floor,yellow bracelet on patient and bed alarm set.Patient call bell within reach.Educated patient on usage of call bell and phone no evidence of learning.Patient confused only alert to self will continue to monitor.

## 2020-04-30 NOTE — ED Provider Notes (Signed)
[facility] EMERGENCY DEPARTMENT Provider Note   CSN: [id number] Arrival date & time: 04/30/20  1204     History Hypotension.    [name] is a 79 y.o. male.  HPI  Patient presents to the emergency department via EMS from SNF for generalized fatigue.  Patient is currently in rehab after recent hospitalization for sepsis of unclear etiology along with acute hypoxic respiratory failure.  History of multiple myeloma; was on chemotherapy.  On arrival, patient found to have systolic blood pressures in the 50s.  Additional HPI information is limited at this time due to the patient's lethargy.  Collateral information obtained from the daughter reports that upon previous discharge, the patient was at his neurologic baseline.  Over the last several weeks, he has had continued decline.  This week he has not been taking much p.o. intake has been quite lethargic.  This is a change from his baseline.  No reported new injuries, falls or trauma.      Past Medical History:  Diagnosis Date  . Atrial flutter ([place])    A. s/p prior ablation;  B.  s/p CTI ablation 10/24/10 (Dr. Caryl Comes)  . CAD (coronary artery disease)    A.  s/p CFX in the past;   B.  cath 06/2010: LAD 30-40%, D1 50%, OM1 50%, AVCFX stent 30%, dCFX 70-80% (med Rx), mRCA 40%;      C.  Echo 06/2010: EF 60-65%, mod LVH; mild LAE     . Complete heart block (HCC)    a. s/p STJ pacemaker  . Cubital tunnel syndrome   . Degenerative disc disease   . Heart block   . HOH (hard of hearing)   . Hx of adenomatous colonic polyps   . Hyperlipidemia   . Hypertension   . Multiple myeloma   . Persistent atrial fibrillation ([place])   . Sleep apnea   . Venous insufficiency     Patient Active Problem List   Diagnosis Date Noted  . AKI (acute kidney injury) ([place]) 04/30/2020  . Hyperlipidemia 04/30/2020  . Delirium with dementia 04/30/2020  . Goals of care, counseling/discussion   . Advanced care planning/counseling  discussion   . Hypoxia   . Palliative care by specialist   . Pressure injury of skin 03/18/2020  . Immunosuppressed due to chemotherapy 03/18/2020  . Acquired thrombophilia ([place]) 03/18/2020  . Weakness generalized 03/18/2020  . Frequent falls 03/18/2020  . Long term (current) use of anticoagulants 03/18/2020  . Lactic acidosis 03/18/2020  . Thrombocytopenia ([place]) 03/18/2020  . Hypoalbuminemia 03/18/2020  . Hyperbilirubinemia 03/18/2020  . Demand ischemia ([place]) 03/18/2020  . Elevated brain natriuretic peptide (BNP) level 03/18/2020  . Elevated procalcitonin 03/18/2020  . Hypokalemia 03/18/2020  . Anemia of chronic disease 03/18/2020  . Scalp hematoma 03/18/2020  . 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 Diverticulosis 03/18/2020  . Obesity (BMI 30.0-34.9) 03/18/2020  . Acute lower UTI 03/15/2020  . Encounter for antineoplastic chemotherapy 01/03/2020  . DNR (do not resuscitate) discussion 01/03/2020  . Visit for monitoring Tikosyn therapy 06/05/2018  . Persistent atrial fibrillation ([place])   . Pneumothorax on right 05/19/2017  . Right-sided muscle weakness 04/28/2013  . 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 Coronary artery disease-nonobstructive catheter in October 2011 12/27/2010  . PACEMAKER, St. Jude dual-chamber 09/14/2010  . Multiple myeloma ([place]) 07/21/2010  . SMOKER 07/21/2010  . Obstructive sleep apnea 06/12/2009  . Atrioventricular block, heart high-grade 01/29/2009  . SICK  SINUS/ TACHY-BRADY SYNDROME 01/29/2009  . Paroxysmal atrial fibrillation ([place]) 01/14/2009  . SCIATICA 01/14/2009  . Essential hypertension 01/03/2009  . VENOUS INSUFFICIENCY 01/03/2009  . ALLERGY 01/03/2009    Past Surgical History:  Procedure Laterality Date  . CARDIOVERSION N/A 04/20/2018   Procedure: CARDIOVERSION;  Surgeon: Skeet Latch, MD;  Location: [place];  Service: Cardiovascular;  Laterality: N/A;  . COLONOSCOPY   10-07-2009   TA polyp, tics, hems   . CORONARY ANGIOPLASTY WITH STENT PLACEMENT  2005  . PACEMAKER PLACEMENT  2011  . POLYPECTOMY    . PPM GENERATOR CHANGEOUT N/A 09/27/2019   Procedure: PPM GENERATOR CHANGEOUT;  Surgeon: Deboraha Sprang, MD;  Location: [place] [facility];  Service: Cardiovascular;  Laterality: N/A;  . Right olecranon nerve surgery    . VENTRAL HERNIA REPAIR         Family History  Problem Relation Age of Onset  . Heart disease Father   . Cancer Father        prostate  . Heart disease Brother   . Colon cancer Neg Hx     Social History   Tobacco Use  . Smoking status: Former Smoker    Packs/day: 1.00    Years: 49.00    Pack years: 49.00    Types: Cigarettes    Quit date: 09/12/2004    Years since quitting: 15.6  . Smokeless tobacco: Former Systems developer  . Tobacco comment: started at age 81; smoked 1 ppd; quit in 2006  Vaping Use  . Vaping Use: Never used  Substance Use Topics  . Alcohol use: Yes    Alcohol/week: 28.0 standard drinks    Types: 28 Standard drinks or equivalent per week    Comment: occasional  . Drug use: No    Home Medications Prior to Admission medications   Medication Sig Start Date End Date Taking? Authorizing Provider  acyclovir (ZOVIRAX) 200 MG capsule Take 1 capsule (200 mg total) by mouth 2 (two) times daily. 01/02/20  Yes Curt Bears, MD  atorvastatin (LIPITOR) 10 MG tablet Take 10 mg by mouth daily at 6 PM.    Yes [provider]  EPINEPHrine 0.3 mg/0.3 mL IJ SOAJ injection Inject 0.3 mg into the muscle once as needed for anaphylaxis. 04/25/17  Yes [provider]  finasteride (PROSCAR) 5 MG tablet Take 5 mg by mouth daily.   Yes [provider]  furosemide (LASIX) 40 MG tablet Take 1 tablet by mouth once daily Patient taking differently: Take 40 mg by mouth daily.  08/31/19  Yes Baldwin Jamaica, PA-C  ketotifen (ALAWAY) 0.025 % ophthalmic solution Place 1 drop into both eyes 2 (two) times daily as needed  (for dry eyes).    Yes [provider]  losartan (COZAAR) 100 MG tablet Take 100 mg by mouth daily.   Yes [provider]  melatonin 3 MG TABS tablet Take 1 tablet (3 mg total) by mouth at bedtime. 03/27/20  Yes Shelly Coss, MD  Multiple Vitamin (MULTIVITAMIN) capsule Take 1 capsule by mouth daily.     Yes [provider]  mupirocin ointment (BACTROBAN) 2 % Apply 1 application topically daily. Apply with dressing change 02/15/20  Yes [provider]  nitroGLYCERIN (NITROSTAT) 0.4 MG SL tablet PLACE ONE TABLET UNDER THE TONGUE EVERY 5 MINUTES AS NEEDED FOR CHEST PAIN Patient taking differently: Place 0.4 mg under the tongue every 5 (five) minutes as needed for chest pain.  07/19/18  Yes Josue Hector, MD  Potassium  Chloride ER 20 MEQ TBCR Take 1 tablet by mouth once daily Patient taking differently: Take 20 mEq by mouth daily.  07/04/19  Yes Baldwin Jamaica, PA-C  prochlorperazine (COMPAZINE) 10 MG tablet Take 1 tablet (10 mg total) by mouth every 6 (six) hours as needed for nausea or vomiting. 01/02/20  Yes Curt Bears, MD  XARELTO 20 MG TABS tablet TAKE 1 TABLET BY MOUTH ONCE DAILY WITH SUPPER Patient taking differently: Take 20 mg by mouth daily with supper.  02/03/20  Yes Deboraha Sprang, MD  Magnesium 200 MG TABS Take 1 tablet (200 mg total) by mouth daily. Patient not taking: Reported on 04/30/2020 05/08/18   Sherran Needs, NP    Allergies    Bee venom  Review of Systems   Review of Systems  Unable to perform ROS: Acuity of condition    Physical Exam Updated Vital Signs BP (!) 94/45 (BP Location: Right Arm)   Pulse 70   Temp 98.5 F (36.9 C) (Oral)   Resp 18   Ht 5' 11"  (1.803 m)   Wt 101.2 kg   SpO2 96%   BMI 31.10 kg/m   Physical Exam Vitals and nursing note reviewed.  Constitutional:      General: He is not in acute distress.    Appearance: Normal appearance. He is well-developed and normal weight. He is ill-appearing and  toxic-appearing.  HENT:     Head: Normocephalic and atraumatic.  Eyes:     Extraocular Movements: Extraocular movements intact.     Conjunctiva/sclera: Conjunctivae normal.     Pupils: Pupils are equal, round, and reactive to light.  Cardiovascular:     Rate and Rhythm: Normal rate and regular rhythm.     Pulses: Normal pulses.     Heart sounds: No murmur heard.   Pulmonary:     Effort: Pulmonary effort is normal. No respiratory distress.     Breath sounds: Normal breath sounds.  Abdominal:     General: There is no distension.     Palpations: Abdomen is soft.     Tenderness: There is no abdominal tenderness.  Musculoskeletal:     Cervical back: Neck supple. No rigidity.     Right lower leg: No edema.     Left lower leg: No edema.  Skin:    General: Skin is warm and dry.  Neurological:     Mental Status: He is lethargic and confused.     GCS: GCS eye subscore is 3. GCS verbal subscore is 2. GCS motor subscore is 4.     Motor: No abnormal muscle tone or seizure activity.     ED Results / Procedures / Treatments   Labs (all labs ordered are listed, but only abnormal results are displayed) Labs Reviewed  URINE CULTURE - Abnormal; Notable for the following components:      Result Value   Culture   (*)    Value: >=100,000 COLONIES/mL ESCHERICHIA COLI SUSCEPTIBILITIES TO FOLLOW Performed at [facility], [address] [place], [place] [address]    All other components within normal limits  MRSA PCR SCREENING - Abnormal; Notable for the following components:   MRSA by PCR POSITIVE (*)    All other components within normal limits  BASIC METABOLIC PANEL - Abnormal; Notable for the following components:   CO2 19 (*)    Glucose, Bld 104 (*)    Creatinine, Ser 2.31 (*)    GFR calc non Af Amer 26 (*)  GFR calc Af Amer 30 (*)    Anion gap 16 (*)    All other components within normal limits  LACTIC ACID, PLASMA - Abnormal; Notable for the following components:    Lactic Acid, Venous 3.3 (*)    All other components within normal limits  LACTIC ACID, PLASMA - Abnormal; Notable for the following components:   Lactic Acid, Venous 2.1 (*)    All other components within normal limits  PROTIME-INR - Abnormal; Notable for the following components:   Prothrombin Time 27.1 (*)    INR 2.6 (*)    All other components within normal limits  HEPATIC FUNCTION PANEL - Abnormal; Notable for the following components:   Total Protein 6.1 (*)    Albumin 3.0 (*)    Total Bilirubin 1.6 (*)    Bilirubin, Direct 0.3 (*)    Indirect Bilirubin 1.3 (*)    All other components within normal limits  URINALYSIS, ROUTINE W REFLEX MICROSCOPIC - Abnormal; Notable for the following components:   APPearance CLOUDY (*)    Hgb urine dipstick MODERATE (*)    Ketones, ur 5 (*)    Protein, ur 100 (*)    Leukocytes,Ua MODERATE (*)    RBC / HPF >50 (*)    WBC, UA >50 (*)    Bacteria, UA MANY (*)    All other components within normal limits  CBC - Abnormal; Notable for the following components:   RBC 3.17 (*)    Hemoglobin 10.3 (*)    HCT 32.8 (*)    MCV 103.5 (*)    All other components within normal limits  CBC - Abnormal; Notable for the following components:   RBC 3.40 (*)    Hemoglobin 11.0 (*)    HCT 34.8 (*)    MCV 102.4 (*)    All other components within normal limits  BASIC METABOLIC PANEL - Abnormal; Notable for the following components:   CO2 17 (*)    Creatinine, Ser 1.65 (*)    Calcium 8.4 (*)    GFR calc non Af Amer 39 (*)    GFR calc Af Amer 45 (*)    All other components within normal limits  CULTURE, BLOOD (ROUTINE X 2)  CULTURE, BLOOD (ROUTINE X 2)  SARS CORONAVIRUS 2 BY RT PCR (HOSPITAL ORDER, [place] [facility])  MAGNESIUM  SODIUM, URINE, RANDOM  UREA NITROGEN, URINE  CREATININE, URINE, RANDOM  CBG MONITORING, ED    EKG EKG Interpretation  Date/Time:  Thursday April 30 2020 12:43:21 EDT Ventricular Rate:  70 PR  Interval:    QRS Duration: 146 QT Interval:  439 QTC Calculation: 474 R Axis:   28 Text Interpretation: Sinus rhythm Short PR interval Left bundle branch block No significant change since last tracing Confirmed by Wandra Arthurs [phone]) on 04/30/2020 3:36:23 PM   Radiology DG Chest 1 View  Result Date: 04/30/2020 CLINICAL DATA:  Hypotension EXAM: CHEST  1 VIEW COMPARISON:  03/25/2020 FINDINGS: Left-sided implanted cardiac device, unchanged. The heart size and mediastinal contours are within normal limits. Atherosclerotic calcification of the aortic knob. Low lung volumes with streaky left basilar opacity, favoring atelectasis. Lungs appear otherwise clear. No pleural fluid collection. No pneumothorax. Subacute appearing nondisplaced fracture of the posterior left fourth rib. IMPRESSION: 1. Low lung volumes with streaky left basilar opacity, favoring atelectasis. 2. Subacute appearing nondisplaced fracture of the posterior left fourth rib. Electronically Signed   By: Davina Poke D.O.   On: 04/30/2020 13:20  US RENAL  Result Date: 04/30/2020 CLINICAL DATA:  Acute kidney injury EXAM: RENAL / URINARY TRACT ULTRASOUND COMPLETE COMPARISON:  CT 03/15/2020 FINDINGS: Right Kidney: Renal measurements: 12.7 x 6.1 x 5 cm = volume: 204.9 mL. Echogenicity within normal limits. No mass or hydronephrosis visualized. Left Kidney: Renal measurements: 12.3 x 6.4 x 5 cm = volume: 202.4 mL. Echogenicity within normal limits. Small cyst exophytic to the lower pole measuring 1.6 cm. Bladder: Appears normal for degree of bladder distention. Other: Enlarged prostate with volume of 76.8 mL. IMPRESSION: 1. Negative for hydronephrosis. 2. Enlarged prostate Electronically Signed   By: Donavan Foil M.D.   On: 04/30/2020 20:16    Procedures Procedures (including critical care time)  Medications Ordered in ED Medications  sodium chloride flush (NS) 0.9 % injection 3 mL (3 mLs Intravenous Given 05/01/20 0959)  0.9 %   sodium chloride infusion (0 mLs Intravenous Stopped 05/01/20 0958)  acetaminophen (TYLENOL) tablet 650 mg (has no administration in time range)    Or  acetaminophen (TYLENOL) suppository 650 mg (has no administration in time range)  atorvastatin (LIPITOR) tablet 10 mg (10 mg Oral Given 04/30/20 2106)  melatonin tablet 3 mg (3 mg Oral Given 04/30/20 2106)  cefTRIAXone (ROCEPHIN) 1 g in sodium chloride 0.9 % 100 mL IVPB (1 g Intravenous New Bag/Given 05/01/20 0014)  mupirocin ointment (BACTROBAN) 2 % 1 application (1 application Nasal Given 05/01/20 0959)  Chlorhexidine Gluconate Cloth 2 % PADS 6 each (6 each Topical Given 05/01/20 0452)  rivaroxaban (XARELTO) tablet 20 mg (has no administration in time range)  lactated ringers bolus 1,000 mL (0 mLs Intravenous Stopped 04/30/20 1530)  vancomycin (VANCOREADY) IVPB 2000 mg/400 mL (0 mg Intravenous Stopped 04/30/20 1703)  ceFEPIme (MAXIPIME) 2 g in sodium chloride 0.9 % 100 mL IVPB (0 g Intravenous Stopped 04/30/20 1359)  lactated ringers bolus 1,000 mL (0 mLs Intravenous Stopped 04/30/20 1625)    ED Course   DIONICIO SHELNUTT is a 79 y.o. male with PMHx listed that presents to the Emergency Department complaint of hypotension.    ED Course: Initial exam completed.   Ill-appearing.  Hemodynamically unstable with hypotension.  Likely toxic.  Afebrile.  Physical exam significant for 79 year old male with lethargy/confusion examination, extremities perfused, abdomen soft, nondistended, nonfocally tender, no evidence of external traumatic injury, and good distal pulses.  Initial differential includes multiple sources of sepsis, viral pneumonia, dehydration, electrolyte abnormalities, and worsening of chronic disease burden.   Given these concerns, sepsis order set initiated.  Additional 1 L NS bolus.  Initial lactic acid 3.3.  CBC without evidence of leukocytosis or leukopenia and stable hemoglobin although slightly anemic 10.3.  COVID-19 negative.  Blood cultures  pending x2.  BMP pending.  INR 2.6.  Hepatic function pending.  Magnesium 2.  CXR with low lung volumes likely atelectasis without focal consolidation.  Patient blood pressure fluid responsive.  Additional fluid resuscitation with LR was completed.  Given the appearance of urine with hypotension and lactic acidosis, concern for septic shock.  Broad-spectrum antibiotics with vancomycin and cefepime.   Diagnostics Vital Signs: reviewed Labs: reviewed and significant findings discussed above Imaging: personally reviewed images interpreted by radiology EKG: reviewed Records: nursing notes along with previous records reviewed and pertinent data discussed   Consults:  none   Reevaluation/Disposition:  Care was transitioned to the oncoming team.  Plan to give additional 1 L fluid and recheck hemodynamics at that time.  Patient may resuscitate fully and be appropriate for floor at that  time.  Likely urosepsis.    Sherolyn Buba, MD Emergency Medicine, PGY-3   Note: Dragon medical dictation software was used in the creation of this note.    Final Clinical Impression(s) / ED Diagnoses Final diagnoses:  AKI (acute kidney injury) [facility])    Rx / [place] Orders ED Discharge Orders    None       Frann Rider, MD 05/01/20 [place]    Charlesetta Shanks, MD 05/16/20 [phone]

## 2020-05-01 DIAGNOSIS — I2510 Atherosclerotic heart disease of native coronary artery without angina pectoris: Secondary | ICD-10-CM | POA: Diagnosis present

## 2020-05-01 DIAGNOSIS — I4819 Other persistent atrial fibrillation: Secondary | ICD-10-CM | POA: Diagnosis present

## 2020-05-01 DIAGNOSIS — S0003XA Contusion of scalp, initial encounter: Secondary | ICD-10-CM | POA: Diagnosis not present

## 2020-05-01 DIAGNOSIS — F0390 Unspecified dementia without behavioral disturbance: Secondary | ICD-10-CM | POA: Diagnosis present

## 2020-05-01 DIAGNOSIS — R4182 Altered mental status, unspecified: Secondary | ICD-10-CM | POA: Diagnosis not present

## 2020-05-01 DIAGNOSIS — G4733 Obstructive sleep apnea (adult) (pediatric): Secondary | ICD-10-CM | POA: Diagnosis not present

## 2020-05-01 DIAGNOSIS — E872 Acidosis: Secondary | ICD-10-CM | POA: Diagnosis present

## 2020-05-01 DIAGNOSIS — E876 Hypokalemia: Secondary | ICD-10-CM | POA: Diagnosis present

## 2020-05-01 DIAGNOSIS — C9000 Multiple myeloma not having achieved remission: Secondary | ICD-10-CM | POA: Diagnosis present

## 2020-05-01 DIAGNOSIS — N390 Urinary tract infection, site not specified: Secondary | ICD-10-CM | POA: Diagnosis not present

## 2020-05-01 DIAGNOSIS — Z950 Presence of cardiac pacemaker: Secondary | ICD-10-CM | POA: Diagnosis not present

## 2020-05-01 DIAGNOSIS — K5730 Diverticulosis of large intestine without perforation or abscess without bleeding: Secondary | ICD-10-CM | POA: Diagnosis not present

## 2020-05-01 DIAGNOSIS — R627 Adult failure to thrive: Secondary | ICD-10-CM | POA: Diagnosis not present

## 2020-05-01 DIAGNOSIS — E785 Hyperlipidemia, unspecified: Secondary | ICD-10-CM

## 2020-05-01 DIAGNOSIS — I4892 Unspecified atrial flutter: Secondary | ICD-10-CM | POA: Diagnosis present

## 2020-05-01 DIAGNOSIS — E162 Hypoglycemia, unspecified: Secondary | ICD-10-CM | POA: Diagnosis not present

## 2020-05-01 DIAGNOSIS — K6389 Other specified diseases of intestine: Secondary | ICD-10-CM | POA: Diagnosis not present

## 2020-05-01 DIAGNOSIS — I480 Paroxysmal atrial fibrillation: Secondary | ICD-10-CM | POA: Diagnosis not present

## 2020-05-01 DIAGNOSIS — Z20822 Contact with and (suspected) exposure to covid-19: Secondary | ICD-10-CM | POA: Diagnosis present

## 2020-05-01 DIAGNOSIS — I952 Hypotension due to drugs: Secondary | ICD-10-CM | POA: Diagnosis present

## 2020-05-01 DIAGNOSIS — R410 Disorientation, unspecified: Secondary | ICD-10-CM

## 2020-05-01 DIAGNOSIS — Z7189 Other specified counseling: Secondary | ICD-10-CM | POA: Diagnosis not present

## 2020-05-01 DIAGNOSIS — I10 Essential (primary) hypertension: Secondary | ICD-10-CM

## 2020-05-01 DIAGNOSIS — G9341 Metabolic encephalopathy: Secondary | ICD-10-CM | POA: Diagnosis present

## 2020-05-01 DIAGNOSIS — B9620 Unspecified Escherichia coli [E. coli] as the cause of diseases classified elsewhere: Secondary | ICD-10-CM | POA: Diagnosis present

## 2020-05-01 DIAGNOSIS — I442 Atrioventricular block, complete: Secondary | ICD-10-CM | POA: Diagnosis present

## 2020-05-01 DIAGNOSIS — Z66 Do not resuscitate: Secondary | ICD-10-CM | POA: Diagnosis present

## 2020-05-01 DIAGNOSIS — F05 Delirium due to known physiological condition: Secondary | ICD-10-CM | POA: Diagnosis present

## 2020-05-01 DIAGNOSIS — N179 Acute kidney failure, unspecified: Secondary | ICD-10-CM | POA: Diagnosis not present

## 2020-05-01 DIAGNOSIS — Z515 Encounter for palliative care: Secondary | ICD-10-CM | POA: Diagnosis not present

## 2020-05-01 DIAGNOSIS — N401 Enlarged prostate with lower urinary tract symptoms: Secondary | ICD-10-CM | POA: Diagnosis present

## 2020-05-01 DIAGNOSIS — H9190 Unspecified hearing loss, unspecified ear: Secondary | ICD-10-CM | POA: Diagnosis present

## 2020-05-01 LAB — CBC
HCT: 34.8 % — ABNORMAL LOW (ref 39.0–52.0)
Hemoglobin: 11 g/dL — ABNORMAL LOW (ref 13.0–17.0)
MCH: 32.4 pg (ref 26.0–34.0)
MCHC: 31.6 g/dL (ref 30.0–36.0)
MCV: 102.4 fL — ABNORMAL HIGH (ref 80.0–100.0)
Platelets: 233 10*3/uL (ref 150–400)
RBC: 3.4 MIL/uL — ABNORMAL LOW (ref 4.22–5.81)
RDW: 13.8 % (ref 11.5–15.5)
WBC: 5.4 10*3/uL (ref 4.0–10.5)
nRBC: 0 % (ref 0.0–0.2)

## 2020-05-01 LAB — BLOOD CULTURE ID PANEL (REFLEXED) - BCID2

## 2020-05-01 LAB — BASIC METABOLIC PANEL
Anion gap: 11 (ref 5–15)
BUN: 18 mg/dL (ref 8–23)
CO2: 17 mmol/L — ABNORMAL LOW (ref 22–32)
Calcium: 8.4 mg/dL — ABNORMAL LOW (ref 8.9–10.3)
Chloride: 107 mmol/L (ref 98–111)
Creatinine, Ser: 1.65 mg/dL — ABNORMAL HIGH (ref 0.61–1.24)
GFR calc Af Amer: 45 mL/min — ABNORMAL LOW (ref >60)
GFR calc non Af Amer: 39 mL/min — ABNORMAL LOW (ref >60)
Glucose, Bld: 79 mg/dL (ref 70–99)
Potassium: 4.7 mmol/L (ref 3.5–5.1)
Sodium: 135 mmol/L (ref 135–145)

## 2020-05-01 LAB — CREATININE, URINE, RANDOM: Creatinine, Urine: 96.28 mg/dL

## 2020-05-01 LAB — MRSA PCR SCREENING: MRSA by PCR: POSITIVE — AB

## 2020-05-01 LAB — SODIUM, URINE, RANDOM: Sodium, Ur: 69 mmol/L

## 2020-05-01 MED ORDER — MUPIROCIN 2 % EX OINT
1.0000 "application " | TOPICAL_OINTMENT | Freq: Two times a day (BID) | CUTANEOUS | Status: AC
  Administered 2020-05-01 – 2020-05-05 (×10): 1 via NASAL
  Filled 2020-05-01 (×4): qty 22

## 2020-05-01 MED ORDER — SODIUM CHLORIDE 0.9 % IV SOLN: INTRAVENOUS | Status: DC

## 2020-05-01 MED ORDER — RIVAROXABAN 20 MG PO TABS
20.0000 mg | ORAL_TABLET | Freq: Every day | ORAL | Status: DC
  Administered 2020-05-02 – 2020-05-05 (×4): 20 mg via ORAL
  Filled 2020-05-01 (×8): qty 1

## 2020-05-01 MED ORDER — CHLORHEXIDINE GLUCONATE CLOTH 2 % EX PADS
6.0000 | MEDICATED_PAD | Freq: Every day | CUTANEOUS | Status: AC
  Administered 2020-05-01 – 2020-05-05 (×5): 6 via TOPICAL

## 2020-05-01 NOTE — Progress Notes (Signed)
PROGRESS NOTE    [name]  VGJ:[id number] DOB: March 08, 1941 DOA: 04/30/2020 PCP: [name], MD   Brief Narrative:  HPI On 04/30/2020 by Dr. Zada Finders [name] is a 79 y.o. 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  History is limited from patient due to dementia with acute delirium therefore is otherwise supplemented from daughter at bedside, EDP, and chart review.  Patient was recently hospitalized from 03/15/2020-03/27/2020 initially for sepsis due to unknown etiology. No obvious infectious source was found. Hospital course was complicated by acute respiratory failure with hypoxia due to aspiration pneumonia versus drug-induced pneumonitis from chemotherapy. He was eventually discharged to SNF.  Daughter states that patient was going to be transported from his SNF to an oncology appointment however on their arrival he appeared unresponsive.  Daughter was called and she says on his arrival he appeared more confused than usual.  She says that he has not been eating well over the last couple of weeks.  She says he complained of feeling generally ill but no specific complaints.  He was brought to the ED for further evaluation. Assessment & Plan   Acute kidney injury -Suspect multifactorial including poor oral intake, hypotension, medications as well as urinary tract infection -Creatinine on admission 2.31, down to 1.65 (baseline creatinine of 0.7-0.9) -Renal ultrasound negative for hydronephrosis, enlarged prostate -Continue IV fluids -Monitor BMP -Home medications of losartan, Lasix, acyclovir and finasteride held  UTI -UA: many bacteria, >50WBC, moderate leukocytes -Urine culture >100K Ecoli -Blood cultures show no growth to date -Currently afebrile with no  leukocytosis -Placed on IV ceftriaxone  Hypotension -Secondary to medications in conjunction with poor oral intake -Place patient on IV fluids and monitor closely -Patient was noted to be orthostatic  Dementia with acute delirium  -Acute delirium in the setting of UTI -Continue to monitor  Atrial fibrillation/complete heart block status post pacemaker placement -CHA2DS2-VASc score 5 -Continue Xarelto -Currently in paced rhythm  CAD -Patient unable to tell me if he is having any chest pain -Continue Xarelto, statin  Essential hypertension -Patient currently hypotensive at this time -Hold Lasix, losartan  Hyperlipidemia -Continue statin  Generalized weakness -PT and OT recommending SNF  Subacute nondisplaced left fourth rib fracture -Continue supportive care -Encourage incentive spirometry  OSA -Continue CPAP  BPH -Noted on renal ultrasound -Finasteride held due to hypotension  DVT Prophylaxis Xarelto  Code Status: DNR  Family Communication: None at bedside  Disposition Plan:  Status is: Inpatient  Remains inpatient appropriate because:Hemodynamically unstable, Altered mental status, IV treatments appropriate due to intensity of illness or inability to take PO and Inpatient level of care appropriate due to severity of illness   Dispo: The patient is from: SNF              Anticipated d/c is to: SNF              Anticipated d/c date is: 2 days              Patient currently is not medically stable to d/c.   Consultants None  Procedures  Renal US  Antibiotics   Anti-infectives (From admission, onward)   Start     Dose/Rate Route Frequency Ordered Stop   05/01/20 1300  cefTRIAXone (ROCEPHIN) 1 g in sodium chloride 0.9 % 100 mL IVPB  Status:  Discontinued        1 g 200 mL/hr over 30 Minutes Intravenous Every 24 hours 04/30/20 1915 04/30/20 2000   05/01/20 0100  cefTRIAXone (ROCEPHIN) 1 g in sodium chloride 0.9 % 100 mL IVPB        1 g 200 mL/hr over  30 Minutes Intravenous Every 24 hours 04/30/20 2000     04/30/20 1315  vancomycin (VANCOREADY) IVPB 2000 mg/400 mL        2,000 mg 200 mL/hr over 120 Minutes Intravenous  Once 04/30/20 1301 04/30/20 1703   04/30/20 1315  ceFEPIme (MAXIPIME) 2 g in sodium chloride 0.9 % 100 mL IVPB        2 g 200 mL/hr over 30 Minutes Intravenous  Once 04/30/20 1301 04/30/20 1359      Subjective:   Rosemarie Beath seen and examined today.  Patient with dementia. Can only tell me his name.  Objective:   Vitals:   05/01/20 0753 05/01/20 1046 05/01/20 1053 05/01/20 1149  BP: 92/79 (!) 64/50 (!) 94/54 (!) 94/45  Pulse: 70 74 70 70  Resp: _0 Temp: 98.2 F (36.8 C)   98.5 F (36.9 C)  TempSrc: Oral   Oral  SpO2: 100%  99% 96%  Weight:      Height:        Intake/Output Summary (Last 24 hours) at 05/01/2020 1528 Last data filed at 05/01/2020 0600 Gross per 24 hour  Intake 3452.82 ml  Output --  Net 3452.82 ml   Filed Weights   04/30/20 1300 04/30/20 2039 05/01/20 0316  Weight: 105 kg 98.4 kg 101.2 kg    Exam  General: Well developed, well nourished, NAD, appears stated age  71: NCAT,  mucous membranes moist.   Cardiovascular: S1 S2 auscultated, RRR  Respiratory: Clear to auscultation bilaterally  Abdomen: Soft, nontender, nondistended, + bowel sounds  Extremities: warm dry without cyanosis clubbing or edema  Neuro: AAOx1, dementia, nonfocal  Skin: Without rashes exudates or nodules  Psych: cannot assess   Data Reviewed: I have personally reviewed following labs and imaging studies  CBC: Recent Labs  Lab 04/30/20 1657 05/01/20 0449  WBC 6.9 5.4  HGB 10.3* 11.0*  HCT 32.8* 34.8*  MCV 103.5* 102.4*  PLT 204 355   Basic Metabolic Panel: Recent Labs  Lab 04/30/20 1257 05/01/20 0449  NA 136 135  K 3.6 4.7  CL 101 107  CO2 19* 17*  GLUCOSE 104* 79  BUN 18 18  CREATININE 2.31* 1.65*  CALCIUM 9.2 8.4*  MG 2.0  --    GFR: Estimated Creatinine Clearance:  43.3 mL/min (A) (by C-G formula based on SCr of 1.65 mg/dL (H)). Liver Function Tests: Recent Labs  Lab 04/30/20 1257  AST 21  ALT 9  ALKPHOS 62  BILITOT 1.6*  PROT 6.1*  ALBUMIN 3.0*   No results for input(s): LIPASE, AMYLASE in the last 168 hours. No results for input(s): AMMONIA in the last 168 hours. Coagulation Profile: Recent Labs  Lab 04/30/20 1257  INR 2.6*   Cardiac Enzymes: No results for input(s): CKTOTAL, CKMB, CKMBINDEX, TROPONINI in the last 168 hours. BNP (last 3 results) No results for input(s): PROBNP in the last 8760 hours. HbA1C: No results for input(s): HGBA1C in the last 72 hours. CBG: Recent Labs  Lab 04/30/20 1235  GLUCAP 98   Lipid Profile: No results for input(s): CHOL, HDL, LDLCALC, TRIG, CHOLHDL, LDLDIRECT in the last 72 hours. Thyroid Function Tests: No  results for input(s): TSH, T4TOTAL, FREET4, T3FREE, THYROIDAB in the last 72 hours. Anemia Panel: No results for input(s): VITAMINB12, FOLATE, FERRITIN, TIBC, IRON, RETICCTPCT in the last 72 hours. Urine analysis:    Component Value Date/Time   COLORURINE YELLOW 04/30/2020 1705   APPEARANCEUR CLOUDY (A) 04/30/2020 1705   LABSPEC 1.010 04/30/2020 1705   PHURINE 5.0 04/30/2020 1705   GLUCOSEU NEGATIVE 04/30/2020 1705   HGBUR MODERATE (A) 04/30/2020 1705   BILIRUBINUR NEGATIVE 04/30/2020 1705   KETONESUR 5 (A) 04/30/2020 1705   PROTEINUR 100 (A) 04/30/2020 1705   UROBILINOGEN 0.2 04/28/2013 1949   NITRITE NEGATIVE 04/30/2020 1705   LEUKOCYTESUR MODERATE (A) 04/30/2020 1705   Sepsis Labs: _0 (procalcitonin:4,lacticidven:4)  ) Recent Results (from the past 240 hour(s))  Culture, blood (Routine x 2)     Status: None (Preliminary result)   Collection Time: 04/30/20 12:45 PM   Specimen: BLOOD  Result Value Ref Range Status   Specimen Description BLOOD LEFT ANTECUBITAL  Final   Special Requests   Final    BOTTLES DRAWN AEROBIC AND ANAEROBIC Blood Culture adequate volume    Culture   Final    NO GROWTH < 24 HOURS Performed at [facility], [place] [address] [place], [place] [address]    Report Status PENDING  Incomplete  Urine culture     Status: Abnormal (Preliminary result)   Collection Time: 04/30/20 12:58 PM   Specimen: Urine, Random  Result Value Ref Range Status   Specimen Description URINE, RANDOM  Final   Special Requests NONE  Final   Culture (A)  Final    >=100,000 COLONIES/mL ESCHERICHIA COLI SUSCEPTIBILITIES TO FOLLOW Performed at [facility], [place] [address] [place], [place] [address]    Report Status PENDING  Incomplete  Culture, blood (Routine x 2)     Status: None (Preliminary result)   Collection Time: 04/30/20  1:00 PM   Specimen: BLOOD  Result Value Ref Range Status   Specimen Description BLOOD RIGHT ANTECUBITAL  Final   Special Requests   Final    BOTTLES DRAWN AEROBIC AND ANAEROBIC Blood Culture adequate volume   Culture   Final    NO GROWTH < 24 HOURS Performed at [facility], [place] [address] [place], [place] [address]    Report Status PENDING  Incomplete  SARS Coronavirus 2 by RT PCR (hospital order, performed in [facility] hospital lab) Nasopharyngeal Nasopharyngeal Swab     Status: None   Collection Time: 04/30/20  1:06 PM   Specimen: Nasopharyngeal Swab  Result Value Ref Range Status   SARS Coronavirus 2 NEGATIVE NEGATIVE Final    Comment: (NOTE) SARS-CoV-2 target nucleic acids are NOT DETECTED.  The SARS-CoV-2 RNA is generally detectable in upper and lower respiratory specimens during the acute phase of infection. The lowest concentration of SARS-CoV-2 viral copies this assay can detect is 250 copies / mL. A negative result does not preclude SARS-CoV-2 infection and should not be used as the sole basis for treatment or other patient management decisions.  A negative result may occur with improper specimen collection / handling, submission of specimen other than nasopharyngeal swab, presence of  viral mutation(s) within the areas targeted by this assay, and inadequate number of viral copies (<250 copies / mL). A negative result must be combined with clinical observations, patient history, and epidemiological information.  Fact Sheet for Patients:   StrictlyIdeas.no  Fact Sheet for Healthcare Providers: BankingDealers.co.za  This test is not yet approved or  cleared by the [place] and has been authorized for detection and/or diagnosis of SARS-CoV-2 by FDA under an Emergency Use Authorization (EUA).  This EUA will remain in effect (meaning this test can be used) for the duration of the COVID-19 declaration under Section 564(b)(1) of the Act, 21 U.S.C. section 360bbb-3(b)(1), unless the authorization is terminated or revoked sooner.  Performed at [facility], [place] [address] [place], [place] [address]   MRSA PCR Screening     Status: Abnormal   Collection Time: 04/30/20  9:00 PM   Specimen: Nasal Mucosa; Nasopharyngeal  Result Value Ref Range Status   MRSA by PCR POSITIVE (A) NEGATIVE Final    Comment:        The GeneXpert MRSA Assay (FDA approved for NASAL specimens only), is one component of a comprehensive MRSA colonization surveillance program. It is not intended to diagnose MRSA infection nor to guide or monitor treatment for MRSA infections. RESULT CALLED TO, READ BACK BY AND VERIFIED WITH: L LOW RN 05/01/20  0019 JDW Performed at [facility], [place] [address], [place], [place] [address]       Radiology Studies: DG Chest 1 View  Result Date: 04/30/2020 CLINICAL DATA:  Hypotension EXAM: CHEST  1 VIEW COMPARISON:  03/25/2020 FINDINGS: Left-sided implanted cardiac device, unchanged. The heart size and mediastinal contours are within normal limits. Atherosclerotic calcification of the aortic knob. Low lung volumes with streaky left basilar opacity, favoring atelectasis. Lungs appear otherwise  clear. No pleural fluid collection. No pneumothorax. Subacute appearing nondisplaced fracture of the posterior left fourth rib. IMPRESSION: 1. Low lung volumes with streaky left basilar opacity, favoring atelectasis. 2. Subacute appearing nondisplaced fracture of the posterior left fourth rib. Electronically Signed   By: Davina Poke D.O.   On: 04/30/2020 13:20   US RENAL  Result Date: 04/30/2020 CLINICAL DATA:  Acute kidney injury EXAM: RENAL / URINARY TRACT ULTRASOUND COMPLETE COMPARISON:  CT 03/15/2020 FINDINGS: Right Kidney: Renal measurements: 12.7 x 6.1 x 5 cm = volume: 204.9 mL. Echogenicity within normal limits. No mass or hydronephrosis visualized. Left Kidney: Renal measurements: 12.3 x 6.4 x 5 cm = volume: 202.4 mL. Echogenicity within normal limits. Small cyst exophytic to the lower pole measuring 1.6 cm. Bladder: Appears normal for degree of bladder distention. Other: Enlarged prostate with volume of 76.8 mL. IMPRESSION: 1. Negative for hydronephrosis. 2. Enlarged prostate Electronically Signed   By: Donavan Foil M.D.   On: 04/30/2020 20:16     Scheduled Meds: . atorvastatin  10 mg Oral q1800  . Chlorhexidine Gluconate Cloth  6 each Topical Q0600  . melatonin  3 mg Oral QHS  . mupirocin ointment  1 application Nasal BID  . Rivaroxaban  20 mg Oral Q supper  . sodium chloride flush  3 mL Intravenous Q12H   Continuous Infusions: . sodium chloride    . cefTRIAXone (ROCEPHIN)  IV 1 g (05/01/20 0014)     LOS: 0 days   Time Spent in minutes   45 minutes  Vergia Chea D.O. on 05/01/2020 at 3:28 PM  Between 7am to 7pm - Please see pager noted on [email or web address]  After 7pm go to [email or web address]  And look for the night coverage person covering for me after hours  [facility] Office  [phone]

## 2020-05-01 NOTE — TOC Initial Note (Addendum)
Transition of Care [facility]) - Initial/Assessment Note    Patient Details  Name: [name] MRN: [id number] Date of Birth: 10-31-1940  Transition of Care [name]) CM/SW Contact:    [address], [place] Phone Number: 05/01/2020, 12:54 PM  Clinical Narrative:             CSW spoke with patient at bedside. Patient had fluctuating orientation at beside and was unable to answer questions. CSW called daughter, [name] to confirm dc plans. [name] is agreeable to SNF placement. Pt's daughter wants CSW to fax out to other SNF's. CSW wil fax out initial referral for SNF placement. Pt's daughter wants to know if [facility] or [facility] would take patient before she decides if pt will return to [facility] or not. CSW called [name] with [facility] to hold off on starting insurance authorization due to pt's daughter wanting to fax out to other facilities. CSW will follow up with [facility] on whether or not pt will return.  CSW will fax out initial referral. CSW will notify facility of choice to start insurance auth once SNF choice is made.         Expected Discharge Plan: Skilled Nursing Facility Barriers to Discharge: Continued Medical Work up, Ship broker   Patient Goals and CMS Choice Patient states their goals for this hospitalization and ongoing recovery are:: to go to SNF CMS Medicare.gov Compare Post Acute Care list provided to:: Patient Represenative (must comment) (Daughter [name]) Choice offered to / list presented to : Adult Children  Expected Discharge Plan and Services Expected Discharge Plan: [place] In-house Referral: Clinical Social Work   Post Acute Care Choice: [place] Living arrangements for the past 2 months: [place]                                      Prior Living Arrangements/Services Living arrangements for the past 2 months: [place] Lives with:: Self Patient language and need for  interpreter reviewed:: Yes Do you feel safe going back to the place where you live?: No   SNF  Need for Family Participation in Patient Care: Yes (Comment) Care giver support system in place?: Yes (comment)   Criminal Activity/Legal Involvement Pertinent to Current Situation/Hospitalization: No - Comment as needed  Activities of Daily Living      Permission Sought/Granted Permission sought to share information with : Family Supports, Customer service manager, Case Manager Permission granted to share information with : Yes, Verbal Permission Granted  Share Information with NAME: [name]  Permission granted to share info w AGENCY: SNF  Permission granted to share info w Relationship: Daughter  Permission granted to share info w Contact Information: [name] [phone]  Emotional Assessment Appearance:: Appears stated age Attitude/Demeanor/Rapport: Lethargic, Other (comment) (confused) Affect (typically observed): Calm Orientation: : Fluctuating Orientation (Suspected and/or reported Sundowners) Alcohol / Substance Use: Not Applicable Psych Involvement: No (comment)  Admission diagnosis:  AKI (acute kidney injury) ([place]) [N17.9] Patient Active Problem List   Diagnosis Date Noted  . AKI (acute kidney injury) ([place]) 04/30/2020  . Hyperlipidemia 04/30/2020  . Delirium with dementia 04/30/2020  . Goals of care, counseling/discussion   . Advanced care planning/counseling discussion   . Hypoxia   . Palliative care by specialist   . Pressure injury of skin 03/18/2020  . Immunosuppressed due to chemotherapy 03/18/2020  . Acquired thrombophilia ([place]) 03/18/2020  . Weakness  generalized 03/18/2020  . Frequent falls 03/18/2020  . Long term (current) use of anticoagulants 03/18/2020  . Lactic acidosis 03/18/2020  . Thrombocytopenia ([place]) 03/18/2020  . Hypoalbuminemia 03/18/2020  . Hyperbilirubinemia 03/18/2020  . Demand ischemia ([place]) 03/18/2020  . Elevated brain natriuretic peptide  (BNP) level 03/18/2020  . Elevated procalcitonin 03/18/2020  . Hypokalemia 03/18/2020  . Anemia of chronic disease 03/18/2020  . Scalp hematoma 03/18/2020  . 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 Diverticulosis 03/18/2020  . Obesity (BMI 30.0-34.9) 03/18/2020  . Acute lower UTI 03/15/2020  . Encounter for antineoplastic chemotherapy 01/03/2020  . DNR (do not resuscitate) discussion 01/03/2020  . Visit for monitoring Tikosyn therapy 06/05/2018  . Persistent atrial fibrillation ([place])   . Pneumothorax on right 05/19/2017  . Right-sided muscle weakness 04/28/2013  . 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 Coronary artery disease-nonobstructive catheter in October 2011 12/27/2010  . PACEMAKER, St. Jude dual-chamber 09/14/2010  . Multiple myeloma ([place]) 07/21/2010  . SMOKER 07/21/2010  . Obstructive sleep apnea 06/12/2009  . Atrioventricular block, heart high-grade 01/29/2009  . SICK SINUS/ TACHY-BRADY SYNDROME 01/29/2009  . Paroxysmal atrial fibrillation ([place]) 01/14/2009  . SCIATICA 01/14/2009  . Essential hypertension 01/03/2009  . VENOUS INSUFFICIENCY 01/03/2009  . ALLERGY 01/03/2009   PCP:  [name], MD Pharmacy:   [facility], [place] - [address] [place] [address] Phone: [phone] Fax: [facility] [place]), [place] - [address] [place]) [place] [address] Phone: [phone] Fax: [phone]     Social Determinants of Health (SDOH) Interventions    Readmission Risk Interventions No flowsheet data found.

## 2020-05-01 NOTE — Progress Notes (Signed)
Pt refused cpap tonight. RT will continue to monitor. 

## 2020-05-01 NOTE — Evaluation (Signed)
Occupational Therapy Evaluation Patient Details Name: [name] MRN: [id number] DOB: April 18, 1941 Today's Date: 05/01/2020    History of Present Illness 79 y.o. 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  Patient was recently hospitalized from 03/15/2020-03/27/2020 initially for sepsis due to unknown etiology. No obvious infectious source was found. Hospital course was complicated by acute respiratory failure with hypoxia discharge to [facility]. Daughter provided hx in ED and states that patient was going to be transported from his SNF to an oncology appointment however on their arrival he appeared unresponsive. Brought to ED with BP 62/31 and found to have AKI. Admitted 04/30/20 for observation and treatment.    Clinical Impression   PTA pt at [facility], requiring assist for ADLs and mobility with RW. Pt with history of dementia, appears to be close to baseline cognition. Daughter present in room to provide most recent history. At time of eval, pt able to complete bed mobility with min A +2. Once EOB pt engaged in grooming tasks with set up A. He does very easily become fixated on certain items and requires frequent cues and redirection to stay on task. Further mobility was limited this session due to continued low BP (see below), RN notified. Pt remains appropriate to return to SNF level of care. Will continue to follow per POC listed below.  BP Supine: 86/52 Sitting EOB: 61/44 Back to supine: 94/54    Follow Up Recommendations  SNF;Supervision/Assistance - 24 hour    Equipment Recommendations  None recommended by OT    Recommendations for Other Services       Precautions / Restrictions Precautions Precautions: Fall Precaution Comments: hx of fall and  dementia Restrictions Weight Bearing Restrictions: No      Mobility Bed Mobility Overal bed mobility: Needs Assistance Bed Mobility: Supine to Sit;Sit to Supine;Rolling Rolling: Min guard   Supine to sit: Min assist;+2 for safety/equipment;HOB elevated Sit to supine: Min assist;+2 for safety/equipment   General bed mobility comments: with increased cuing pt able to come to EoB with min Ax2, min A for management of trunk in bed and for scooting up in the bed  Transfers                 General transfer comment: did not attempt due to low BP    Balance Overall balance assessment: Needs assistance Sitting-balance support: Feet supported;Single extremity supported;Bilateral upper extremity supported Sitting balance-Leahy Scale: Poor Sitting balance - Comments: supervision for balance EoB                                   ADL either performed or assessed with clinical judgement   ADL Overall ADL's : Needs assistance/impaired Eating/Feeding: Minimal assistance;Sitting;Cueing for safety;Cueing for sequencing   Grooming: Set up;Sitting Grooming Details (indicate cue type and reason): to wash face and brush hair. Set up and cues to initiate Upper Body Bathing: Moderate assistance;Sitting   Lower Body Bathing: Maximal assistance;Sitting/lateral leans;Sit to/from stand   Upper Body Dressing : Moderate assistance;Sitting   Lower Body Dressing: Maximal assistance;Sitting/lateral leans;Sit to/from stand   Toilet Transfer: Maximal assistance;+2 for physical assistance;+2 for safety/equipment;RW   Toileting- Clothing Manipulation and Hygiene: Total assistance Toileting - Clothing Manipulation Details (indicate cue type and reason): had to  hold urinal for pt to urinate     Functional mobility during ADLs: Maximal assistance;+2 for physical assistance;+2 for safety/equipment;Cueing for safety;Cueing for sequencing General ADL Comments: pt limited by drop in BP      Vision Baseline Vision/History: Wears glasses Wears Glasses: At all times Patient Visual Report: No change from baseline       Perception     Praxis      Pertinent Vitals/Pain Pain Assessment: No/denies pain Pain Score: 0-No pain     Hand Dominance Right   Extremity/Trunk Assessment Upper Extremity Assessment Upper Extremity Assessment: Generalized weakness   Lower Extremity Assessment Lower Extremity Assessment: Defer to PT evaluation;Generalized weakness RLE Deficits / Details: lacking full ROM in hips, knees and ankles  RLE Coordination: decreased fine motor LLE Deficits / Details: lacking full ROM in hips, knees and ankles LLE Coordination: decreased fine motor       Communication Communication Communication: HOH   Cognition Arousal/Alertness: Awake/alert Behavior During Therapy: Restless Overall Cognitive Status: Impaired/Different from baseline Area of Impairment: Orientation;Attention;Memory;Following commands;Safety/judgement;Awareness;Problem solving                 Orientation Level: Disoriented to;Place;Time;Situation Current Attention Level: Selective Memory: Decreased short-term memory Following Commands: Follows one step commands with increased time;Follows multi-step commands inconsistently Safety/Judgement: Decreased awareness of deficits Awareness: Emergent Problem Solving: Slow processing;Requires verbal cues;Requires tactile cues;Decreased initiation General Comments: Pt with history of dementia; requires frequent redirection and increased time to process basic tasks.   General Comments      Exercises     Shoulder Instructions      Home Living Family/patient expects to be discharged to:: Skilled nursing facility                                 Additional Comments: Pt from [facility]      Prior Functioning/Environment Level of Independence: Needs assistance  Gait / Transfers Assistance Needed: reports  using RW at SNF ADL's / Homemaking Assistance Needed: assist provided from SNF staff   Comments: pt daughter in room on entry, states pt is close to baseline level of cognition, unable to provide information on function at SNF states she only saw him out of bed once        OT Problem List: Decreased strength;Decreased activity tolerance;Impaired balance (sitting and/or standing);Decreased safety awareness;Cardiopulmonary status limiting activity;Pain;Increased edema;Decreased cognition;Decreased knowledge of use of DME or AE      OT Treatment/Interventions: Self-care/ADL training;Therapeutic exercise;Energy conservation;DME and/or AE instruction;Therapeutic activities;Visual/perceptual remediation/compensation;Patient/family education;Balance training;Cognitive remediation/compensation    OT Goals(Current goals can be found in the care plan section) Acute Rehab OT Goals Patient Stated Goal: none stated OT Goal Formulation: Patient unable to participate in goal setting Time For Goal Achievement: 05/15/20 Potential to Achieve Goals: Good  OT Frequency: Min 2X/week   Barriers to D/C:            Co-evaluation PT/OT/SLP Co-Evaluation/Treatment: Yes Reason for Co-Treatment: For patient/therapist safety;To address functional/ADL transfers PT goals addressed during session: Mobility/safety with mobility OT goals addressed during session: ADL's and self-care;Strengthening/ROM      AM-PAC OT "6 Clicks" Daily Activity     Outcome Measure Help from another person eating meals?: A Little Help from another person taking care of personal grooming?: A Little Help from another person toileting, which includes using toliet, bedpan, or urinal?: A Lot Help from another person bathing (including washing, rinsing, drying)?: A Lot Help from  another person to put on and taking off regular upper body clothing?: A Lot Help from another person to put on and taking off regular lower body clothing?: A  Lot 6 Click Score: 14   End of Session Equipment Utilized During Treatment: Surveyor, mining Communication: Mobility status  Activity Tolerance: Patient tolerated treatment well Patient left: in bed;with call bell/phone within reach;with restraints reapplied;with bed alarm set  OT Visit Diagnosis: Unsteadiness on feet (R26.81);Muscle weakness (generalized) (M62.81);Other symptoms and signs involving cognitive function                Time: 9563-8756 OT Time Calculation (min): 31 min Charges:  OT General Charges $OT Visit: 1 Visit OT Evaluation $OT Eval Moderate Complexity: Foster, MSOT, OTR/L Acute Rehabilitation Services [facility] Office Number: [phone] Pager: [phone]  Zenovia Jarred 05/01/2020, 1:48 PM

## 2020-05-01 NOTE — NC FL2 (Signed)
[place] LEVEL OF CARE SCREENING TOOL     IDENTIFICATION  Patient Name: [name] Birthdate: Oct 05, 1940 Sex: male Admission Date (Current Location): 04/30/2020  [facility] and [place] Number:  Herbalist and Address:  [facility] [place] [address] [place], [place] [address]      Provider Number: [id number]  Attending Physician Name and Address:  Cristal [name], DO  Relative Name and Phone Number:  [name] [phone]    Current Level of Care: Hospital Recommended Level of Care: [place] Prior Approval Number:    Date Approved/Denied:   PASRR Number: [id number]  Discharge Plan: SNF    Current Diagnoses: Patient Active Problem List   Diagnosis Date Noted  . AKI (acute kidney injury) ([place]) 04/30/2020  . Hyperlipidemia 04/30/2020  . Delirium with dementia 04/30/2020  . Goals of care, counseling/discussion   . Advanced care planning/counseling discussion   . Hypoxia   . Palliative care by specialist   . Pressure injury of skin 03/18/2020  . Immunosuppressed due to chemotherapy 03/18/2020  . Acquired thrombophilia ([place]) 03/18/2020  . Weakness generalized 03/18/2020  . Frequent falls 03/18/2020  . Long term (current) use of anticoagulants 03/18/2020  . Lactic acidosis 03/18/2020  . Thrombocytopenia ([place]) 03/18/2020  . Hypoalbuminemia 03/18/2020  . Hyperbilirubinemia 03/18/2020  . Demand ischemia ([place]) 03/18/2020  . Elevated brain natriuretic peptide (BNP) level 03/18/2020  . Elevated procalcitonin 03/18/2020  . Hypokalemia 03/18/2020  . Anemia of chronic disease 03/18/2020  . Scalp hematoma 03/18/2020  . 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 Diverticulosis 03/18/2020  . Obesity (BMI 30.0-34.9) 03/18/2020  . Acute lower UTI 03/15/2020  . Encounter for antineoplastic chemotherapy 01/03/2020  . DNR (do not resuscitate) discussion 01/03/2020  . Visit for monitoring Tikosyn  therapy 06/05/2018  . Persistent atrial fibrillation ([place])   . Pneumothorax on right 05/19/2017  . Right-sided muscle weakness 04/28/2013  . 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 Coronary artery disease-nonobstructive catheter in October 2011 12/27/2010  . PACEMAKER, St. Jude dual-chamber 09/14/2010  . Multiple myeloma ([place]) 07/21/2010  . SMOKER 07/21/2010  . Obstructive sleep apnea 06/12/2009  . Atrioventricular block, heart high-grade 01/29/2009  . SICK SINUS/ TACHY-BRADY SYNDROME 01/29/2009  . Paroxysmal atrial fibrillation ([place]) 01/14/2009  . SCIATICA 01/14/2009  . Essential hypertension 01/03/2009  . VENOUS INSUFFICIENCY 01/03/2009  . ALLERGY 01/03/2009    Orientation RESPIRATION BLADDER Height & Weight      (Fluctuating orientation)  Normal Incontinent, External catheter (External Urinary Catheter) Weight: 223 lb (101.2 kg) Height:  5' 11" (180.3 cm)  BEHAVIORAL SYMPTOMS/MOOD NEUROLOGICAL BOWEL NUTRITION STATUS      Incontinent Diet (See Discharge Summary)  AMBULATORY STATUS COMMUNICATION OF NEEDS Skin   Limited Assist Verbally Surgical wounds, Skin abrasions, Other (Comment) (Approp. for eth,dry,abrasion arm;leg;bilatereal,excoriated arm;leg;biateral;foam,pressure injury buttocks mid stage 1,wound/incision (open or dehisced) arm right;left)                       Personal Care Assistance Level of Assistance  Bathing, Feeding, Dressing Bathing Assistance: Limited assistance Feeding assistance: Independent (able to feed self;needs set up) Dressing Assistance: Limited assistance     Functional Limitations Info  Sight, Hearing, Speech Sight Info: Adequate Hearing Info: Impaired Speech Info: Adequate    SPECIAL CARE FACTORS FREQUENCY  PT (By licensed PT), OT (By licensed OT)     PT Frequency: 5x min weekly OT Frequency: 5x min weekly  Contractures Contractures Info: Not present    Additional Factors Info  Code  Status, Allergies Code Status Info: DNR Allergies Info: Bee Venom           Current Medications (05/01/2020):  This is the current hospital active medication list Current Facility-Administered Medications  Medication Dose Route Frequency Provider Last Rate Last Admin  . acetaminophen (TYLENOL) tablet 650 mg  650 mg Oral Q6H PRN Lenore Cordia, MD       Or  . acetaminophen (TYLENOL) suppository 650 mg  650 mg Rectal Q6H PRN Zada Finders R, MD      . atorvastatin (LIPITOR) tablet 10 mg  10 mg Oral q1800 Lenore Cordia, MD   10 mg at 04/30/20 2106  . cefTRIAXone (ROCEPHIN) 1 g in sodium chloride 0.9 % 100 mL IVPB  1 g Intravenous Q24H Zada Finders R, MD 200 mL/hr at 05/01/20 0014 1 g at 05/01/20 0014  . Chlorhexidine Gluconate Cloth 2 % PADS 6 each  6 each Topical Q0600 Lenore Cordia, MD   6 each at 05/01/20 [phone]  . melatonin tablet 3 mg  3 mg Oral QHS Lenore Cordia, MD   3 mg at 04/30/20 2106  . mupirocin ointment (BACTROBAN) 2 % 1 application  1 application Nasal BID Lenore Cordia, MD   1 application at 47/42/59 [phone]  . rivaroxaban (XARELTO) tablet 20 mg  20 mg Oral Q supper Einar Grad, RPH      . sodium chloride flush (NS) 0.9 % injection 3 mL  3 mL Intravenous Q12H Lenore Cordia, MD   3 mL at 05/01/20 7564     Discharge Medications: Please see discharge summary for a list of discharge medications.  Relevant Imaging Results:  Relevant Lab Results:   Additional Information [phone]  Trula Ore, LCSWA

## 2020-05-01 NOTE — Evaluation (Signed)
Physical Therapy Evaluation Patient Details Name: [name] MRN: [id number] DOB: Apr 20, 1941 Today's Date: 05/01/2020   History of Present Illness  79 y.o. 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  Patient was recently hospitalized from 03/15/2020-03/27/2020 initially for sepsis due to unknown etiology. No obvious infectious source was found. Hospital course was complicated by acute respiratory failure with hypoxia discharge to [facility]. Daughter provided hx in ED and states that patient was going to be transported from his SNF to an oncology appointment however on their arrival he appeared unresponsive. Brought to ED with BP 62/31 and found to have AKI. Admitted 04/30/20 for observation and treatment.   Clinical Impression  Pt is known to therapist from prior hospitalization. Pt currently from [facility]. Daughter was in room at beginning of session but was unable to provide information on what progress he had made with therapy. Pt confusion is greater than prior admission and command follow is difficult, however ROM and strength are improved. Pt is currently limited in safe mobility by orthostatic hypotension as well as decreased cognition especially safety awareness. Pt is minAx2 for safety with supine to sit. Pt able to sit EoB ~5 min with only supervision while attempt to obtain BP, needs redirection to keep arm still to get accurate reading.  and sit to supine. PT recommend return to SNF at discharge to continue his therapy to improve mobility. PT will continue to follow acutely.  Orthostatic BPs                                                              BP                    HR Supine 86/52 70  Sitting 61/44 68  Sitting after 2 min 64/50 67  Supine 94/54 70       Follow Up  Recommendations SNF    Equipment Recommendations  None recommended by PT       Precautions / Restrictions Precautions Precautions: Fall Precaution Comments: hx of fall and dementia Restrictions Weight Bearing Restrictions: No      Mobility  Bed Mobility Overal bed mobility: Needs Assistance Bed Mobility: Supine to Sit;Sit to Supine Rolling: Min guard   Supine to sit: Min assist;+2 for safety/equipment;HOB elevated Sit to supine: Min assist;+2 for safety/equipment   General bed mobility comments: with increased cuing pt able to come to EoB with min Ax2, min A for management of trunk in bed and for scooting up in the bed  Transfers                 General transfer comment: did not attempt due to low BP        Balance Overall balance assessment: Needs assistance Sitting-balance support: Feet supported;Single extremity supported;Bilateral upper extremity supported Sitting balance-Leahy Scale: Poor Sitting balance - Comments: supervision for balance EoB                                     Pertinent Vitals/Pain Pain Assessment:  Faces Pain Score: 0-No pain    Home Living Family/patient expects to be discharged to:: Skilled nursing facility                      Prior Function Level of Independence: Needs assistance   Gait / Transfers Assistance Needed: reports using RW at SNF     Comments: pt daughter in room on entry, states pt is close to baseline level of cognition, unable to provide information on function at SNF states she only saw him out of bed once     Hand Dominance   Dominant Hand: Right    Extremity/Trunk Assessment   Upper Extremity Assessment Upper Extremity Assessment: Defer to OT evaluation    Lower Extremity Assessment RLE Deficits / Details: lacking full ROM in hips, knees and ankles  RLE Coordination: decreased fine motor LLE Deficits / Details: lacking full ROM in hips, knees and ankles LLE Coordination:  decreased fine motor       Communication   Communication: HOH  Cognition Arousal/Alertness: Awake/alert Behavior During Therapy: Restless Overall Cognitive Status: Impaired/Different from baseline Area of Impairment: Orientation;Attention;Memory;Following commands;Safety/judgement;Awareness;Problem solving                 Orientation Level: Disoriented to;Place;Time;Situation Current Attention Level: Selective Memory: Decreased short-term memory Following Commands: Follows one step commands with increased time;Follows multi-step commands inconsistently Safety/Judgement: Decreased awareness of deficits Awareness: Emergent Problem Solving: Slow processing;Requires verbal cues;Requires tactile cues;Decreased initiation General Comments: Pt requires redirection and increased cuing for task completion, pt preoccupied by condom catheter.       General Comments General comments (skin integrity, edema, etc.): supine BP 86/52, HR 70 SaO2 on RA 100%, sitting BP 61/44, HR 68 SaO2 on RA 99% O2, with return to supine BP 94/54, HR 70 SaO2 on RA 100%        Assessment/Plan    PT Assessment Patient needs continued PT services  PT Problem List Decreased strength;Decreased mobility;Decreased cognition;Cardiopulmonary status limiting activity;Decreased safety awareness       PT Treatment Interventions DME instruction;Gait training;Functional mobility training;Therapeutic activities;Therapeutic exercise;Balance training;Cognitive remediation;Patient/family education    PT Goals (Current goals can be found in the Care Plan section)  Acute Rehab PT Goals Patient Stated Goal: none stated PT Goal Formulation: Patient unable to participate in goal setting Time For Goal Achievement: 05/15/20 Potential to Achieve Goals: Fair    Frequency Min 2X/week        Co-evaluation PT/OT/SLP Co-Evaluation/Treatment: Yes Reason for Co-Treatment: For patient/therapist safety PT goals addressed  during session: Mobility/safety with mobility         AM-PAC PT "6 Clicks" Mobility  Outcome Measure Help needed turning from your back to your side while in a flat bed without using bedrails?: None Help needed moving from lying on your back to sitting on the side of a flat bed without using bedrails?: A Lot Help needed moving to and from a bed to a chair (including a wheelchair)?: A Lot Help needed standing up from a chair using your arms (e.g., wheelchair or bedside chair)?: A Lot Help needed to walk in hospital room?: A Lot Help needed climbing 3-5 steps with a railing? : A Lot 6 Click Score: 14    End of Session   Activity Tolerance: Treatment limited secondary to medical complications (Comment) (did not attempt standing due to low BP) Patient left: in bed;with call bell/phone within reach;with bed alarm set;with restraints reapplied Nurse Communication: Mobility status;Other (comment) (orthostatic BP) PT  Visit Diagnosis: Unsteadiness on feet (R26.81);Muscle weakness (generalized) (M62.81);Difficulty in walking, not elsewhere classified (R26.2);History of falling (Z91.81)    Time: 1034-1100 PT Time Calculation (min) (ACUTE ONLY): 26 min   Charges:   PT Evaluation $PT Eval Moderate Complexity: 1 Mod          Tillmon Kisling B. Migdalia Dk PT, DPT Acute Rehabilitation Services Pager [phone] Office [phone]   [place] 05/01/2020, 11:34 AM

## 2020-05-01 NOTE — Progress Notes (Signed)
PHARMACY - PHYSICIAN COMMUNICATION CRITICAL VALUE ALERT - BLOOD CULTURE IDENTIFICATION (BCID)   1/2 blood cultures positive for staph epi.   Name of physician (or Provider) Contacted: [place]  Changes to prescribed antibiotics required: No changes at this time, treating possible uti with ceftriaxone. Staph epi likely contaminant.    Erin Hearing PharmD., BCPS Clinical Pharmacist 05/01/2020 10:30 PM

## 2020-05-02 LAB — URINE CULTURE: Culture: >=100000 — AB

## 2020-05-02 LAB — BASIC METABOLIC PANEL
Anion gap: 15 (ref 5–15)
BUN: 16 mg/dL (ref 8–23)
CO2: 15 mmol/L — ABNORMAL LOW (ref 22–32)
Calcium: 8.9 mg/dL (ref 8.9–10.3)
Chloride: 111 mmol/L (ref 98–111)
Creatinine, Ser: 1.06 mg/dL (ref 0.61–1.24)
GFR calc Af Amer: >60 mL/min (ref >60)
GFR calc non Af Amer: >60 mL/min (ref >60)
Glucose, Bld: 72 mg/dL (ref 70–99)
Potassium: 4.3 mmol/L (ref 3.5–5.1)
Sodium: 141 mmol/L (ref 135–145)

## 2020-05-02 LAB — UREA NITROGEN, URINE: Urea Nitrogen, Ur: 106 mg/dL

## 2020-05-02 MED ORDER — SODIUM BICARBONATE 650 MG PO TABS
650.0000 mg | ORAL_TABLET | Freq: Two times a day (BID) | ORAL | Status: DC
  Administered 2020-05-02 – 2020-05-04 (×5): 650 mg via ORAL
  Filled 2020-05-02 (×5): qty 1

## 2020-05-02 NOTE — TOC Progression Note (Signed)
Transition of Care [facility]) - Progression Note    Patient Details  Name: [name] MRN: [id number] Date of Birth: [birth year]  Transition of Care [facility]) CM/SW [place], [place] Phone Number: [phone] 05/02/2020, 3:52 PM  Clinical Narrative:     CSW checked hub for SNF bed offers. No bed offers at this time.  TOC team will continue to assist with discharge planning needs.   Expected Discharge Plan: [place] Barriers to Discharge: Continued Medical Work up, Ship broker  Expected Discharge Plan and Services Expected Discharge Plan: [place] In-house Referral: Clinical Social Work   Post Acute Care Choice: [place] Living arrangements for the past 2 months: [place]                                       Social Determinants of Health (SDOH) Interventions    Readmission Risk Interventions Readmission Risk Prevention Plan 05/01/2020  Transportation Screening Complete  Skilled Nursing Facility Complete  Some recent data might be hidden

## 2020-05-02 NOTE — Progress Notes (Signed)
PROGRESS NOTE    [name]  ATF:[id number] DOB: [birth year] DOA: 04/30/2020 PCP: [name], MD   Brief Narrative:  HPI On 04/30/2020 by Dr. Zada Finders [name] is a 79 y.o. 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  History is limited from patient due to dementia with acute delirium therefore is otherwise supplemented from daughter at bedside, EDP, and chart review.  Patient was recently hospitalized from 03/15/2020-03/27/2020 initially for sepsis due to unknown etiology. No obvious infectious source was found. Hospital course was complicated by acute respiratory failure with hypoxia due to aspiration pneumonia versus drug-induced pneumonitis from chemotherapy. He was eventually discharged to SNF.  Daughter states that patient was going to be transported from his SNF to an oncology appointment however on their arrival he appeared unresponsive.  Daughter was called and she says on his arrival he appeared more confused than usual.  She says that he has not been eating well over the last couple of weeks.  She says he complained of feeling generally ill but no specific complaints.  He was brought to the ED for further evaluation. Assessment & Plan   Acute kidney injury -Suspect multifactorial including poor oral intake, hypotension, medications as well as urinary tract infection -Creatinine on admission 2.31, down to 1.06 (baseline creatinine of 0.7-0.9) -Renal ultrasound negative for hydronephrosis, enlarged prostate -Continue IV fluids -Monitor BMP -Home medications of losartan, Lasix, acyclovir and finasteride held  UTI -UA: many bacteria, >50WBC, moderate leukocytes -Urine culture >100K Ecoli, pansenisitive -Blood cultures 1/2  Positive for staph epidermidis (likely  contaminant) -Currently afebrile with no leukocytosis -Placed on IV ceftriaxone  Hypotension -Secondary to medications in conjunction with poor oral intake -Patient was noted to be orthostatic -BP has improved  -Will discontinue IV fluids and monitor blood pressure  Metabolic acidosis -likely secondary to the above -continue to monitor BMP  Dementia with acute delirium  -Acute delirium in the setting of UTI -Continue to monitor  Atrial fibrillation/complete heart block status post pacemaker placement -CHA2DS2-VASc score 5 -Continue Xarelto -Currently in paced rhythm  CAD -Patient unable to tell me if he is having any chest pain -Continue Xarelto, statin  Essential hypertension -Patient currently hypotensive at this time -Hold Lasix, losartan  Hyperlipidemia -Continue statin  Generalized weakness -PT and OT recommending SNF  Subacute nondisplaced left fourth rib fracture -Continue supportive care -Encourage incentive spirometry  OSA -Continue CPAP  BPH -Noted on renal ultrasound -Finasteride held due to hypotension  DVT Prophylaxis Xarelto  Code Status: DNR  Family Communication: None at bedside  Disposition Plan:  Status is: Inpatient  Remains inpatient appropriate because:Hemodynamically unstable, Altered mental status, IV treatments appropriate due to intensity of illness or inability to take PO and Inpatient level of care appropriate due to severity of illness   Dispo: The patient is from: SNF              Anticipated d/c is to: SNF              Anticipated d/c date is: 1 days              Patient currently is not medically stable to d/c.   Consultants None  Procedures  Renal US  Antibiotics   Anti-infectives (From admission, onward)   Start     Dose/Rate Route Frequency  Ordered Stop   05/01/20 1300  cefTRIAXone (ROCEPHIN) 1 g in sodium chloride 0.9 % 100 mL IVPB  Status:  Discontinued        1 g 200 mL/hr over 30 Minutes Intravenous  Every 24 hours 04/30/20 1915 04/30/20 2000   05/01/20 0100  cefTRIAXone (ROCEPHIN) 1 g in sodium chloride 0.9 % 100 mL IVPB        1 g 200 mL/hr over 30 Minutes Intravenous Every 24 hours 04/30/20 2000     04/30/20 1315  vancomycin (VANCOREADY) IVPB 2000 mg/400 mL        2,000 mg 200 mL/hr over 120 Minutes Intravenous  Once 04/30/20 1301 04/30/20 1703   04/30/20 1315  ceFEPIme (MAXIPIME) 2 g in sodium chloride 0.9 % 100 mL IVPB        2 g 200 mL/hr over 30 Minutes Intravenous  Once 04/30/20 1301 04/30/20 1359      Subjective:   [name] seen and examined today.  Patient with dementia. Can only tell me his name.   Objective:   Vitals:   05/01/20 1929 05/01/20 2320 05/02/20 0512 05/02/20 0735  BP: 118/62  (!) 99/57 (!) 119/58  Pulse:  70 70 70  Resp: 18 18 18 20   Temp: 98 F (36.7 C) 99 F (37.2 C) 98 F (36.7 C) 98.1 F (36.7 C)  TempSrc: Oral Oral Oral Oral  SpO2:   99% 98%  Weight:   101.6 kg   Height:        Intake/Output Summary (Last 24 hours) at 05/02/2020 1317 Last data filed at 05/02/2020 0600 Gross per 24 hour  Intake 1101.23 ml  Output --  Net 1101.23 ml   Filed Weights   04/30/20 2039 05/01/20 0316 05/02/20 0512  Weight: 98.4 kg 101.2 kg 101.6 kg    Exam  General: Well developed, well nourished, elderly, NAD  HEENT: NCAT,  mucous membranes moist.   Cardiovascular: S1 S2 auscultated, RRR  Respiratory: Clear to auscultation bilaterally, no wheezing   Abdomen: Soft, nontender, nondistended, + bowel sounds  Extremities: warm dry without cyanosis clubbing or edema  Neuro: AAOx1, dementia, nonfocal  Psych: pleasantly confused   Data Reviewed: I have personally reviewed following labs and imaging studies  CBC: Recent Labs  Lab 04/30/20 1657 05/01/20 0449  WBC 6.9 5.4  HGB 10.3* 11.0*  HCT 32.8* 34.8*  MCV 103.5* 102.4*  PLT 204 929   Basic Metabolic Panel: Recent Labs  Lab 04/30/20 1257 05/01/20 0449 05/02/20 0220  NA 136 135  141  K 3.6 4.7 4.3  CL 101 107 111  CO2 19* 17* 15*  GLUCOSE 104* 79 72  BUN 18 18 16   CREATININE 2.31* 1.65* 1.06  CALCIUM 9.2 8.4* 8.9  MG 2.0  --   --    GFR: Estimated Creatinine Clearance: 67.5 mL/min (by C-G formula based on SCr of 1.06 mg/dL). Liver Function Tests: Recent Labs  Lab 04/30/20 1257  AST 21  ALT 9  ALKPHOS 62  BILITOT 1.6*  PROT 6.1*  ALBUMIN 3.0*   No results for input(s): LIPASE, AMYLASE in the last 168 hours. No results for input(s): AMMONIA in the last 168 hours. Coagulation Profile: Recent Labs  Lab 04/30/20 1257  INR 2.6*   Cardiac Enzymes: No results for input(s): CKTOTAL, CKMB, CKMBINDEX, TROPONINI in the last 168 hours. BNP (last 3 results) No results for input(s): PROBNP in the last 8760 hours. HbA1C: No results for input(s): HGBA1C in the last 72 hours. CBG:  Recent Labs  Lab 04/30/20 1235  GLUCAP 98   Lipid Profile: No results for input(s): CHOL, HDL, LDLCALC, TRIG, CHOLHDL, LDLDIRECT in the last 72 hours. Thyroid Function Tests: No results for input(s): TSH, T4TOTAL, FREET4, T3FREE, THYROIDAB in the last 72 hours. Anemia Panel: No results for input(s): VITAMINB12, FOLATE, FERRITIN, TIBC, IRON, RETICCTPCT in the last 72 hours. Urine analysis:    Component Value Date/Time   COLORURINE YELLOW 04/30/2020 1705   APPEARANCEUR CLOUDY (A) 04/30/2020 1705   LABSPEC 1.010 04/30/2020 1705   PHURINE 5.0 04/30/2020 1705   GLUCOSEU NEGATIVE 04/30/2020 1705   HGBUR MODERATE (A) 04/30/2020 1705   BILIRUBINUR NEGATIVE 04/30/2020 1705   KETONESUR 5 (A) 04/30/2020 1705   PROTEINUR 100 (A) 04/30/2020 1705   UROBILINOGEN 0.2 04/28/2013 1949   NITRITE NEGATIVE 04/30/2020 1705   LEUKOCYTESUR MODERATE (A) 04/30/2020 1705   Sepsis Labs: @LABRCNTIP (procalcitonin:4,lacticidven:4)  ) Recent Results (from the past 240 hour(s))  Culture, blood (Routine x 2)     Status: None (Preliminary result)   Collection Time: 04/30/20 12:45 PM   Specimen:  BLOOD  Result Value Ref Range Status   Specimen Description BLOOD LEFT ANTECUBITAL  Final   Special Requests   Final    BOTTLES DRAWN AEROBIC AND ANAEROBIC Blood Culture adequate volume   Culture   Final    NO GROWTH < 24 HOURS Performed at [facility], [place] [address], [place], [place] [address]    Report Status PENDING  Incomplete  Urine culture     Status: Abnormal   Collection Time: 04/30/20 12:58 PM   Specimen: Urine, Random  Result Value Ref Range Status   Specimen Description URINE, RANDOM  Final   Special Requests   Final    NONE Performed at [facility], [place] [address], [place], [place] [address]    Culture >=100,000 COLONIES/mL ESCHERICHIA COLI (A)  Final   Report Status 05/02/2020 FINAL  Final   Organism ID, Bacteria ESCHERICHIA COLI (A)  Final      Susceptibility   Escherichia coli - MIC*    AMPICILLIN 4 SENSITIVE Sensitive     CEFAZOLIN <=4 SENSITIVE Sensitive     CEFTRIAXONE <=0.25 SENSITIVE Sensitive     CIPROFLOXACIN <=0.25 SENSITIVE Sensitive     GENTAMICIN <=1 SENSITIVE Sensitive     IMIPENEM <=0.25 SENSITIVE Sensitive     NITROFURANTOIN <=16 SENSITIVE Sensitive     TRIMETH/SULFA <=20 SENSITIVE Sensitive     AMPICILLIN/SULBACTAM <=2 SENSITIVE Sensitive     PIP/TAZO <=4 SENSITIVE Sensitive     * >=100,000 COLONIES/mL ESCHERICHIA COLI  Culture, blood (Routine x 2)     Status: Abnormal (Preliminary result)   Collection Time: 04/30/20  1:00 PM   Specimen: BLOOD  Result Value Ref Range Status   Specimen Description BLOOD RIGHT ANTECUBITAL  Final   Special Requests   Final    BOTTLES DRAWN AEROBIC AND ANAEROBIC Blood Culture adequate volume   Culture  Setup Time   Final    GRAM POSITIVE COCCI IN CLUSTERS AEROBIC BOTTLE ONLY Organism ID to follow CRITICAL RESULT CALLED TO, READ BACK BY AND VERIFIED WITH: Erin Hearing PHARMD @2132  05/01/20 EB    Culture (A)  Final    STAPHYLOCOCCUS EPIDERMIDIS THE SIGNIFICANCE OF ISOLATING THIS ORGANISM FROM A  SINGLE SET OF BLOOD CULTURES WHEN MULTIPLE SETS ARE DRAWN IS UNCERTAIN. PLEASE NOTIFY THE MICROBIOLOGY DEPARTMENT WITHIN ONE WEEK IF SPECIATION AND SENSITIVITIES ARE REQUIRED. Performed at [facility], [place]  [address], [place], [place] [address]    Report Status PENDING  Incomplete  Blood Culture ID Panel (Reflexed)     Status: Abnormal   Collection Time: 04/30/20  1:00 PM  Result Value Ref Range Status   Enterococcus faecalis NOT DETECTED NOT DETECTED Final   Enterococcus Faecium NOT DETECTED NOT DETECTED Final   Listeria monocytogenes NOT DETECTED NOT DETECTED Final   Staphylococcus species DETECTED (A) NOT DETECTED Final    Comment: RESULT CALLED TO, READ BACK BY AND VERIFIED WITH: Erin Hearing PHARMD @2132  05/01/20 EB    Staphylococcus aureus (BCID) NOT DETECTED NOT DETECTED Final   Staphylococcus epidermidis DETECTED (A) NOT DETECTED Final    Comment: Methicillin (oxacillin) resistant coagulase negative staphylococcus. Possible blood culture contaminant (unless isolated from more than one blood culture draw or clinical case suggests pathogenicity). No antibiotic treatment is indicated for blood  culture contaminants. RESULT CALLED TO, READ BACK BY AND VERIFIED WITH: Erin Hearing PHARMD @2132  05/01/20 EB    Staphylococcus lugdunensis NOT DETECTED NOT DETECTED Final   Streptococcus species NOT DETECTED NOT DETECTED Final   Streptococcus agalactiae NOT DETECTED NOT DETECTED Final   Streptococcus pneumoniae NOT DETECTED NOT DETECTED Final   Streptococcus pyogenes NOT DETECTED NOT DETECTED Final   A.calcoaceticus-baumannii NOT DETECTED NOT DETECTED Final   Bacteroides fragilis NOT DETECTED NOT DETECTED Final   Enterobacterales NOT DETECTED NOT DETECTED Final   Enterobacter cloacae complex NOT DETECTED NOT DETECTED Final   Escherichia coli NOT DETECTED NOT DETECTED Final   Klebsiella aerogenes NOT DETECTED NOT DETECTED Final   Klebsiella oxytoca NOT DETECTED NOT DETECTED Final    Klebsiella pneumoniae NOT DETECTED NOT DETECTED Final   Proteus species NOT DETECTED NOT DETECTED Final   Salmonella species NOT DETECTED NOT DETECTED Final   Serratia marcescens NOT DETECTED NOT DETECTED Final   Haemophilus influenzae NOT DETECTED NOT DETECTED Final   Neisseria meningitidis NOT DETECTED NOT DETECTED Final   Pseudomonas aeruginosa NOT DETECTED NOT DETECTED Final   Stenotrophomonas maltophilia NOT DETECTED NOT DETECTED Final   Candida albicans NOT DETECTED NOT DETECTED Final   Candida auris NOT DETECTED NOT DETECTED Final   Candida glabrata NOT DETECTED NOT DETECTED Final   Candida krusei NOT DETECTED NOT DETECTED Final   Candida parapsilosis NOT DETECTED NOT DETECTED Final   Candida tropicalis NOT DETECTED NOT DETECTED Final   Cryptococcus neoformans/gattii NOT DETECTED NOT DETECTED Final   Methicillin resistance mecA/C DETECTED (A) NOT DETECTED Final    Comment: RESULT CALLED TO, READ BACK BY AND VERIFIED WITH: Erin Hearing PHARMD @2132  05/01/20 EB Performed at [facility], [address] [place],  [address]   SARS Coronavirus 2 by RT PCR (hospital order, performed in [facility] hospital lab) Nasopharyngeal Nasopharyngeal Swab     Status: None   Collection Time: 04/30/20  1:06 PM   Specimen: Nasopharyngeal Swab  Result Value Ref Range Status   SARS Coronavirus 2 NEGATIVE NEGATIVE Final    Comment: (NOTE) SARS-CoV-2 target nucleic acids are NOT DETECTED.  The SARS-CoV-2 RNA is generally detectable in upper and lower respiratory specimens during the acute phase of infection. The lowest concentration of SARS-CoV-2 viral copies this assay can detect is 250 copies / mL. A negative result does not preclude SARS-CoV-2 infection and should not be used as the sole basis for treatment or other patient management decisions.  A negative result may occur with improper specimen collection / handling, submission of specimen other than nasopharyngeal swab, presence  of viral mutation(s) within  the areas targeted by this assay, and inadequate number of viral copies (<250 copies / mL). A negative result must be combined with clinical observations, patient history, and epidemiological information.  Fact Sheet for Patients:   StrictlyIdeas.no  Fact Sheet for Healthcare Providers: BankingDealers.co.za  This test is not yet approved or  cleared by the [place] FDA and has been authorized for detection and/or diagnosis of SARS-CoV-2 by FDA under an Emergency Use Authorization (EUA).  This EUA will remain in effect (meaning this test can be used) for the duration of the COVID-19 declaration under Section 564(b)(1) of the Act, 21 U.S.C. section 360bbb-3(b)(1), unless the authorization is terminated or revoked sooner.  Performed at [facility], [place] [address] [place], [place] [address]   MRSA PCR Screening     Status: Abnormal   Collection Time: 04/30/20  9:00 PM   Specimen: Nasal Mucosa; Nasopharyngeal  Result Value Ref Range Status   MRSA by PCR POSITIVE (A) NEGATIVE Final    Comment:        The GeneXpert MRSA Assay (FDA approved for NASAL specimens only), is one component of a comprehensive MRSA colonization surveillance program. It is not intended to diagnose MRSA infection nor to guide or monitor treatment for MRSA infections. RESULT CALLED TO, READ BACK BY AND VERIFIED WITH: L LOW RN 05/01/20  0019 JDW Performed at [facility], [place] [address], [place], [place] [address]       Radiology Studies: US RENAL  Result Date: 04/30/2020 CLINICAL DATA:  Acute kidney injury EXAM: RENAL / URINARY TRACT ULTRASOUND COMPLETE COMPARISON:  CT 03/15/2020 FINDINGS: Right Kidney: Renal measurements: 12.7 x 6.1 x 5 cm = volume: 204.9 mL. Echogenicity within normal limits. No mass or hydronephrosis visualized. Left Kidney: Renal measurements: 12.3 x 6.4 x 5 cm = volume: 202.4 mL. Echogenicity  within normal limits. Small cyst exophytic to the lower pole measuring 1.6 cm. Bladder: Appears normal for degree of bladder distention. Other: Enlarged prostate with volume of 76.8 mL. IMPRESSION: 1. Negative for hydronephrosis. 2. Enlarged prostate Electronically Signed   By: Donavan Foil M.D.   On: 04/30/2020 20:16     Scheduled Meds: . atorvastatin  10 mg Oral q1800  . Chlorhexidine Gluconate Cloth  6 each Topical Q0600  . melatonin  3 mg Oral QHS  . mupirocin ointment  1 application Nasal BID  . Rivaroxaban  20 mg Oral Q supper  . sodium chloride flush  3 mL Intravenous Q12H   Continuous Infusions: . sodium chloride 75 mL/hr at 05/01/20 2358  . cefTRIAXone (ROCEPHIN)  IV 1 g (05/02/20 0108)     LOS: 1 day   Time Spent in minutes   30 minutes  Tiquan Bouch D.O. on 05/02/2020 at 1:17 PM  Between 7am to 7pm - Please see pager noted on [email or web address]  After 7pm go to [email or web address]  And look for the night coverage person covering for me after hours  [facility] Office  [phone]

## 2020-05-02 NOTE — Progress Notes (Signed)
Patient refused use of CPAP tonight.  RT will continue to monitor.

## 2020-05-02 NOTE — Progress Notes (Signed)
MEDICATION RELATED CONSULT NOTE  Patient refused Xarelto dose last night. Dr. Ree Kida made aware, no changes at this time.  Rebbeca Paul, PharmD PGY1 Pharmacy Resident 05/02/2020 3:11 PM  Please check [email or web address] for unit-specific pharmacy phone numbers.

## 2020-05-03 LAB — CBC
HCT: 34.5 % — ABNORMAL LOW (ref 39.0–52.0)
Hemoglobin: 11.5 g/dL — ABNORMAL LOW (ref 13.0–17.0)
MCH: 33.1 pg (ref 26.0–34.0)
MCHC: 33.3 g/dL (ref 30.0–36.0)
MCV: 99.4 fL (ref 80.0–100.0)
Platelets: 241 10*3/uL (ref 150–400)
RBC: 3.47 MIL/uL — ABNORMAL LOW (ref 4.22–5.81)
RDW: 13.6 % (ref 11.5–15.5)
WBC: 5.2 10*3/uL (ref 4.0–10.5)
nRBC: 0 % (ref 0.0–0.2)

## 2020-05-03 LAB — CULTURE, BLOOD (ROUTINE X 2): Special Requests: ADEQUATE

## 2020-05-03 LAB — BASIC METABOLIC PANEL
Anion gap: 15 (ref 5–15)
BUN: 11 mg/dL (ref 8–23)
CO2: 15 mmol/L — ABNORMAL LOW (ref 22–32)
Calcium: 8.8 mg/dL — ABNORMAL LOW (ref 8.9–10.3)
Chloride: 111 mmol/L (ref 98–111)
Creatinine, Ser: 0.84 mg/dL (ref 0.61–1.24)
GFR calc Af Amer: >60 mL/min (ref >60)
GFR calc non Af Amer: >60 mL/min (ref >60)
Glucose, Bld: 65 mg/dL — ABNORMAL LOW (ref 70–99)
Potassium: 3.9 mmol/L (ref 3.5–5.1)
Sodium: 141 mmol/L (ref 135–145)

## 2020-05-03 MED ORDER — CEPHALEXIN 500 MG PO CAPS
500.0000 mg | ORAL_CAPSULE | Freq: Two times a day (BID) | ORAL | Status: DC
  Administered 2020-05-03 – 2020-05-06 (×5): 500 mg via ORAL
  Filled 2020-05-03 (×6): qty 1

## 2020-05-03 NOTE — Progress Notes (Addendum)
pt refuses cpap at this time. RT will monitor. Pt refused 3 nights, so order now dc'd

## 2020-05-03 NOTE — TOC Progression Note (Addendum)
Transition of Care [facility]) - Progression Note    Patient Details  Name: [name] MRN: [id number] Date of Birth: September 23, 1940  [facility] CM/SW [place], [place] Phone Number: 05/03/2020, 9:56 AM  Clinical Narrative:     CSW called [facility] and [facility] to follow up on pending bed offers. CSW spoke with Otila Kluver from [place] and is unable to review referral in hub due to no access and is working from home today. Otila Kluver told CSW she will follow up with CSW tomorrow to see if they can accept patient for SNF placement. CSW left voicemail with Irine Seal from [facility] and [place] with [facility] to return call regarding pending bed offer for patient. CSW will follow up with patients daughter tomorrow regarding bed offers. At the moment patient has no bed offers.Patient is long term at [facility]. CSW will speak with patients daughter about possible return to [facility] if [place], [place], or [place] can not offer a SNF bed for patient.  Pending bed offers. Facility will need to start auth once SNF choice is made.   Expected Discharge Plan: [place] Barriers to Discharge: Continued Medical Work up, Ship broker  Expected Discharge Plan and Services Expected Discharge Plan: [place] In-house Referral: Clinical Social Work   Post Acute Care Choice: [place] Living arrangements for the past 2 months: [place]                                       Social Determinants of Health (SDOH) Interventions    Readmission Risk Interventions Readmission Risk Prevention Plan 05/01/2020  Transportation Screening Complete  Skilled Nursing Facility Complete  Some recent data might be hidden

## 2020-05-03 NOTE — Progress Notes (Addendum)
PROGRESS NOTE    [name]  AVW:[id number] DOB: 12-Sep-1941 DOA: 04/30/2020 PCP: [name], MD   Brief Narrative:  HPI On 04/30/2020 by Dr. Zada Finders [name] is a 79 y.o. 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  History is limited from patient due to dementia with acute delirium therefore is otherwise supplemented from daughter at bedside, EDP, and chart review.  Patient was recently hospitalized from 03/15/2020-03/27/2020 initially for sepsis due to unknown etiology. No obvious infectious source was found. Hospital course was complicated by acute respiratory failure with hypoxia due to aspiration pneumonia versus drug-induced pneumonitis from chemotherapy. He was eventually discharged to SNF.  Daughter states that patient was going to be transported from his SNF to an oncology appointment however on their arrival he appeared unresponsive.  Daughter was called and she says on his arrival he appeared more confused than usual.  She says that he has not been eating well over the last couple of weeks.  She says he complained of feeling generally ill but no specific complaints.  He was brought to the ED for further evaluation.  Assessment & Plan   Acute kidney injury -Suspect multifactorial including poor oral intake, hypotension, medications as well as urinary tract infection -Creatinine on admission 2.31, down to 0.84 (baseline creatinine of 0.7-0.9) -Renal ultrasound negative for hydronephrosis, enlarged prostate -Continue IV fluids -Monitor BMP -Home medications of losartan, Lasix, acyclovir and finasteride held  UTI -UA: many bacteria, >50WBC, moderate leukocytes -Urine culture >100K Ecoli, pansenisitive -Blood cultures 1/2  Positive for staph epidermidis  (likely contaminant) -Currently afebrile with no leukocytosis -Placed on IV ceftriaxone and transitioned to keflex   Hypotension -Secondary to medications in conjunction with poor oral intake -Patient was noted to be orthostatic -BP has improved  -Discontinued IVF and BP has remained stable -Of note, sepsis was not present and has been ruled out  Metabolic acidosis -likely secondary to the above -continue to monitor BMP  Dementia with acute delirium  -Acute delirium in the setting of UTI -Continue to monitor  Atrial fibrillation/complete heart block status post pacemaker placement -CHA2DS2-VASc score 5 -Continue Xarelto -Currently in paced rhythm  CAD -Patient unable to tell me if he is having any chest pain -Continue Xarelto, statin  Essential hypertension -Patient currently hypotensive at this time -Hold Lasix, losartan  Hyperlipidemia -Continue statin  Generalized weakness -PT and OT recommending SNF  Subacute nondisplaced left fourth rib fracture -Continue supportive care -Encourage incentive spirometry  OSA -Continue CPAP  BPH -Noted on renal ultrasound -Finasteride held due to hypotension  DVT Prophylaxis Xarelto  Code Status: DNR  Family Communication: None at bedside  Disposition Plan:  Status is: Inpatient  Remains inpatient appropriate because: Unsafe discharge. Pending SNF authorization.    Dispo: The patient is from: SNF              Anticipated d/c is to: SNF              Anticipated d/c date is: 1 days              Patient currently is not medically stable to d/c.   Consultants None  Procedures  Renal US  Antibiotics   Anti-infectives (From admission, onward)   Start     Dose/Rate Route Frequency Ordered Stop   05/03/20 2200  cephALEXin (KEFLEX) capsule 500 mg        500 mg Oral Every 12 hours 05/03/20 1045     05/01/20 1300  cefTRIAXone (ROCEPHIN) 1 g in sodium chloride 0.9 % 100 mL IVPB  Status:  Discontinued        1  g 200 mL/hr over 30 Minutes Intravenous Every 24 hours 04/30/20 1915 04/30/20 2000   05/01/20 0100  cefTRIAXone (ROCEPHIN) 1 g in sodium chloride 0.9 % 100 mL IVPB  Status:  Discontinued        1 g 200 mL/hr over 30 Minutes Intravenous Every 24 hours 04/30/20 2000 05/03/20 1045   04/30/20 1315  vancomycin (VANCOREADY) IVPB 2000 mg/400 mL        2,000 mg 200 mL/hr over 120 Minutes Intravenous  Once 04/30/20 1301 04/30/20 1703   04/30/20 1315  ceFEPIme (MAXIPIME) 2 g in sodium chloride 0.9 % 100 mL IVPB        2 g 200 mL/hr over 30 Minutes Intravenous  Once 04/30/20 1301 04/30/20 1359      Subjective:   [name] seen and examined today.  Patient with dementia. Has no complaints this morning.    Objective:   Vitals:   05/02/20 1929 05/02/20 2341 05/03/20 0258 05/03/20 0300  BP: 121/69 121/69  119/73  Pulse: 72 70 72   Resp: 18 18 18    Temp: 98.6 F (37 C) 98.5 F (36.9 C) 97.6 F (36.4 C)   TempSrc: Oral Oral Oral   SpO2: 99% 98% 98%   Weight:   100.7 kg   Height:        Intake/Output Summary (Last 24 hours) at 05/03/2020 1202 Last data filed at 05/03/2020 0600 Gross per 24 hour  Intake 250 ml  Output 200 ml  Net 50 ml   Filed Weights   05/01/20 0316 05/02/20 0512 05/03/20 0258  Weight: 101.2 kg 101.6 kg 100.7 kg    Exam  General: Well developed, well nourished, elderly, NAD  HEENT: NCAT,  mucous membranes moist.   Cardiovascular: S1 S2 auscultated, RRR  Respiratory: Clear to auscultation bilaterally, no wheezing   Abdomen: Soft, nontender, nondistended, + bowel sounds  Extremities: warm dry without cyanosis clubbing or edema  Neuro: AAOx2 (self and place), dementia, nonfocal  Psych: pleasantly confused   Data Reviewed: I have personally reviewed following labs and imaging studies  CBC: Recent Labs  Lab 04/30/20 1657 05/01/20 0449 05/03/20 0422  WBC 6.9 5.4 5.2  HGB 10.3* 11.0* 11.5*  HCT 32.8* 34.8* 34.5*  MCV 103.5* 102.4* 99.4  PLT 204  233 094   Basic Metabolic Panel: Recent Labs  Lab 04/30/20 1257 05/01/20 0449 05/02/20 0220 05/03/20 0422  NA 136 135 141 141  K 3.6 4.7 4.3 3.9  CL 101 107 111 111  CO2 19* 17* 15* 15*  GLUCOSE 104* 79 72 65*  BUN 18 18 16 11   CREATININE 2.31* 1.65* 1.06 0.84  CALCIUM 9.2 8.4* 8.9 8.8*  MG 2.0  --   --   --    GFR: Estimated Creatinine Clearance: 84.8 mL/min (by C-G formula based on SCr of 0.84 mg/dL). Liver Function Tests: Recent Labs  Lab 04/30/20 1257  AST 21  ALT 9  ALKPHOS 62  BILITOT 1.6*  PROT 6.1*  ALBUMIN 3.0*   No results for input(s): LIPASE, AMYLASE in the last 168 hours. No results for input(s): AMMONIA in the last 168 hours. Coagulation Profile: Recent Labs  Lab 04/30/20 1257  INR 2.6*  Cardiac Enzymes: No results for input(s): CKTOTAL, CKMB, CKMBINDEX, TROPONINI in the last 168 hours. BNP (last 3 results) No results for input(s): PROBNP in the last 8760 hours. HbA1C: No results for input(s): HGBA1C in the last 72 hours. CBG: Recent Labs  Lab 04/30/20 1235  GLUCAP 98   Lipid Profile: No results for input(s): CHOL, HDL, LDLCALC, TRIG, CHOLHDL, LDLDIRECT in the last 72 hours. Thyroid Function Tests: No results for input(s): TSH, T4TOTAL, FREET4, T3FREE, THYROIDAB in the last 72 hours. Anemia Panel: No results for input(s): VITAMINB12, FOLATE, FERRITIN, TIBC, IRON, RETICCTPCT in the last 72 hours. Urine analysis:    Component Value Date/Time   COLORURINE YELLOW 04/30/2020 1705   APPEARANCEUR CLOUDY (A) 04/30/2020 1705   LABSPEC 1.010 04/30/2020 1705   PHURINE 5.0 04/30/2020 1705   GLUCOSEU NEGATIVE 04/30/2020 1705   HGBUR MODERATE (A) 04/30/2020 1705   BILIRUBINUR NEGATIVE 04/30/2020 1705   KETONESUR 5 (A) 04/30/2020 1705   PROTEINUR 100 (A) 04/30/2020 1705   UROBILINOGEN 0.2 04/28/2013 1949   NITRITE NEGATIVE 04/30/2020 1705   LEUKOCYTESUR MODERATE (A) 04/30/2020 1705   Sepsis  Labs: @LABRCNTIP (procalcitonin:4,lacticidven:4)  ) Recent Results (from the past 240 hour(s))  Culture, blood (Routine x 2)     Status: None (Preliminary result)   Collection Time: 04/30/20 12:45 PM   Specimen: BLOOD  Result Value Ref Range Status   Specimen Description BLOOD LEFT ANTECUBITAL  Final   Special Requests   Final    BOTTLES DRAWN AEROBIC AND ANAEROBIC Blood Culture adequate volume   Culture   Final    NO GROWTH 3 DAYS Performed at [facility], [place] [address], [place], [place] [address]    Report Status PENDING  Incomplete  Urine culture     Status: Abnormal   Collection Time: 04/30/20 12:58 PM   Specimen: Urine, Random  Result Value Ref Range Status   Specimen Description URINE, RANDOM  Final   Special Requests   Final    NONE Performed at [facility], [place] [address], [place], [place] [address]    Culture >=100,000 COLONIES/mL ESCHERICHIA COLI (A)  Final   Report Status 05/02/2020 FINAL  Final   Organism ID, Bacteria ESCHERICHIA COLI (A)  Final      Susceptibility   Escherichia coli - MIC*    AMPICILLIN 4 SENSITIVE Sensitive     CEFAZOLIN <=4 SENSITIVE Sensitive     CEFTRIAXONE <=0.25 SENSITIVE Sensitive     CIPROFLOXACIN <=0.25 SENSITIVE Sensitive     GENTAMICIN <=1 SENSITIVE Sensitive     IMIPENEM <=0.25 SENSITIVE Sensitive     NITROFURANTOIN <=16 SENSITIVE Sensitive     TRIMETH/SULFA <=20 SENSITIVE Sensitive     AMPICILLIN/SULBACTAM <=2 SENSITIVE Sensitive     PIP/TAZO <=4 SENSITIVE Sensitive     * >=100,000 COLONIES/mL ESCHERICHIA COLI  Culture, blood (Routine x 2)     Status: Abnormal   Collection Time: 04/30/20  1:00 PM   Specimen: BLOOD  Result Value Ref Range Status   Specimen Description BLOOD RIGHT ANTECUBITAL  Final   Special Requests   Final    BOTTLES DRAWN AEROBIC AND ANAEROBIC Blood Culture adequate volume   Culture  Setup Time   Final    GRAM POSITIVE COCCI IN CLUSTERS AEROBIC BOTTLE ONLY Organism ID to follow CRITICAL  RESULT CALLED TO, READ BACK BY AND VERIFIED WITH: Erin Hearing PHARMD @2132  05/01/20 EB    Culture (A)  Final    STAPHYLOCOCCUS EPIDERMIDIS THE SIGNIFICANCE OF ISOLATING THIS  ORGANISM FROM A SINGLE SET OF BLOOD CULTURES WHEN MULTIPLE SETS ARE DRAWN IS UNCERTAIN. PLEASE NOTIFY THE MICROBIOLOGY DEPARTMENT WITHIN ONE WEEK IF SPECIATION AND SENSITIVITIES ARE REQUIRED. Performed at [facility], [place] [address] [place], [place] [address]    Report Status 05/03/2020 FINAL  Final  Blood Culture ID Panel (Reflexed)     Status: Abnormal   Collection Time: 04/30/20  1:00 PM  Result Value Ref Range Status   Enterococcus faecalis NOT DETECTED NOT DETECTED Final   Enterococcus Faecium NOT DETECTED NOT DETECTED Final   Listeria monocytogenes NOT DETECTED NOT DETECTED Final   Staphylococcus species DETECTED (A) NOT DETECTED Final    Comment: RESULT CALLED TO, READ BACK BY AND VERIFIED WITH: Erin Hearing PHARMD @2132  05/01/20 EB    Staphylococcus aureus (BCID) NOT DETECTED NOT DETECTED Final   Staphylococcus epidermidis DETECTED (A) NOT DETECTED Final    Comment: Methicillin (oxacillin) resistant coagulase negative staphylococcus. Possible blood culture contaminant (unless isolated from more than one blood culture draw or clinical case suggests pathogenicity). No antibiotic treatment is indicated for blood  culture contaminants. RESULT CALLED TO, READ BACK BY AND VERIFIED WITH: Erin Hearing PHARMD @2132  05/01/20 EB    Staphylococcus lugdunensis NOT DETECTED NOT DETECTED Final   Streptococcus species NOT DETECTED NOT DETECTED Final   Streptococcus agalactiae NOT DETECTED NOT DETECTED Final   Streptococcus pneumoniae NOT DETECTED NOT DETECTED Final   Streptococcus pyogenes NOT DETECTED NOT DETECTED Final   A.calcoaceticus-baumannii NOT DETECTED NOT DETECTED Final   Bacteroides fragilis NOT DETECTED NOT DETECTED Final   Enterobacterales NOT DETECTED NOT DETECTED Final   Enterobacter cloacae complex  NOT DETECTED NOT DETECTED Final   Escherichia coli NOT DETECTED NOT DETECTED Final   Klebsiella aerogenes NOT DETECTED NOT DETECTED Final   Klebsiella oxytoca NOT DETECTED NOT DETECTED Final   Klebsiella pneumoniae NOT DETECTED NOT DETECTED Final   Proteus species NOT DETECTED NOT DETECTED Final   Salmonella species NOT DETECTED NOT DETECTED Final   Serratia marcescens NOT DETECTED NOT DETECTED Final   Haemophilus influenzae NOT DETECTED NOT DETECTED Final   Neisseria meningitidis NOT DETECTED NOT DETECTED Final   Pseudomonas aeruginosa NOT DETECTED NOT DETECTED Final   Stenotrophomonas maltophilia NOT DETECTED NOT DETECTED Final   Candida albicans NOT DETECTED NOT DETECTED Final   Candida auris NOT DETECTED NOT DETECTED Final   Candida glabrata NOT DETECTED NOT DETECTED Final   Candida krusei NOT DETECTED NOT DETECTED Final   Candida parapsilosis NOT DETECTED NOT DETECTED Final   Candida tropicalis NOT DETECTED NOT DETECTED Final   Cryptococcus neoformans/gattii NOT DETECTED NOT DETECTED Final   Methicillin resistance mecA/C DETECTED (A) NOT DETECTED Final    Comment: RESULT CALLED TO, READ BACK BY AND VERIFIED WITH: Erin Hearing PHARMD @2132  05/01/20 EB Performed at [facility], [address], [place], [place] [address]   SARS Coronavirus 2 by RT PCR (hospital order, performed in [facility] hospital lab) Nasopharyngeal Nasopharyngeal Swab     Status: None   Collection Time: 04/30/20  1:06 PM   Specimen: Nasopharyngeal Swab  Result Value Ref Range Status   SARS Coronavirus 2 NEGATIVE NEGATIVE Final    Comment: (NOTE) SARS-CoV-2 target nucleic acids are NOT DETECTED.  The SARS-CoV-2 RNA is generally detectable in upper and lower respiratory specimens during the acute phase of infection. The lowest concentration of SARS-CoV-2 viral copies this assay can detect is 250 copies / mL. A negative result does not preclude SARS-CoV-2 infection and should  not be used as the sole  basis for treatment or other patient management decisions.  A negative result may occur with improper specimen collection / handling, submission of specimen other than nasopharyngeal swab, presence of viral mutation(s) within the areas targeted by this assay, and inadequate number of viral copies (<250 copies / mL). A negative result must be combined with clinical observations, patient history, and epidemiological information.  Fact Sheet for Patients:   StrictlyIdeas.no  Fact Sheet for Healthcare Providers: BankingDealers.co.za  This test is not yet approved or  cleared by the [place] FDA and has been authorized for detection and/or diagnosis of SARS-CoV-2 by FDA under an Emergency Use Authorization (EUA).  This EUA will remain in effect (meaning this test can be used) for the duration of the COVID-19 declaration under Section 564(b)(1) of the Act, 21 U.S.C. section 360bbb-3(b)(1), unless the authorization is terminated or revoked sooner.  Performed at [facility], [place] [address] [place], [place] [address]   MRSA PCR Screening     Status: Abnormal   Collection Time: 04/30/20  9:00 PM   Specimen: Nasal Mucosa; Nasopharyngeal  Result Value Ref Range Status   MRSA by PCR POSITIVE (A) NEGATIVE Final    Comment:        The GeneXpert MRSA Assay (FDA approved for NASAL specimens only), is one component of a comprehensive MRSA colonization surveillance program. It is not intended to diagnose MRSA infection nor to guide or monitor treatment for MRSA infections. RESULT CALLED TO, READ BACK BY AND VERIFIED WITH: L LOW RN 05/01/20  0019 JDW Performed at [facility], [place] [address] [place], [place] [address]       Radiology Studies: No results found.   Scheduled Meds: . atorvastatin  10 mg Oral q1800  . cephALEXin  500 mg Oral Q12H  . Chlorhexidine Gluconate Cloth  6 each Topical Q0600  . melatonin  3 mg  Oral QHS  . mupirocin ointment  1 application Nasal BID  . Rivaroxaban  20 mg Oral Q supper  . sodium bicarbonate  650 mg Oral BID  . sodium chloride flush  3 mL Intravenous Q12H   Continuous Infusions:    LOS: 2 days   Time Spent in minutes   30 minutes  Cosandra Plouffe D.O. on 05/03/2020 at 12:02 PM  Between 7am to 7pm - Please see pager noted on [email or web address]  After 7pm go to [email or web address]  And look for the night coverage person covering for me after hours  [facility] Office  [phone]

## 2020-05-04 ENCOUNTER — Inpatient Hospital Stay (HOSPITAL_COMMUNITY): Payer: Medicare HMO

## 2020-05-04 DIAGNOSIS — E162 Hypoglycemia, unspecified: Secondary | ICD-10-CM

## 2020-05-04 LAB — BASIC METABOLIC PANEL
Anion gap: 20 — ABNORMAL HIGH (ref 5–15)
BUN: 8 mg/dL (ref 8–23)
CO2: 12 mmol/L — ABNORMAL LOW (ref 22–32)
Calcium: 9.2 mg/dL (ref 8.9–10.3)
Chloride: 110 mmol/L (ref 98–111)
Creatinine, Ser: 0.93 mg/dL (ref 0.61–1.24)
GFR calc Af Amer: >60 mL/min (ref >60)
GFR calc non Af Amer: >60 mL/min (ref >60)
Glucose, Bld: 56 mg/dL — ABNORMAL LOW (ref 70–99)
Potassium: 4 mmol/L (ref 3.5–5.1)
Sodium: 142 mmol/L (ref 135–145)

## 2020-05-04 LAB — GLUCOSE, CAPILLARY
Glucose-Capillary: 48 mg/dL — ABNORMAL LOW (ref 70–99)
Glucose-Capillary: 57 mg/dL — ABNORMAL LOW (ref 70–99)
Glucose-Capillary: 49 mg/dL — ABNORMAL LOW (ref 70–99)
Glucose-Capillary: 115 mg/dL — ABNORMAL HIGH (ref 70–99)

## 2020-05-04 LAB — SARS CORONAVIRUS 2 BY RT PCR (HOSPITAL ORDER, PERFORMED IN ~~LOC~~ HOSPITAL LAB): SARS Coronavirus 2: NEGATIVE

## 2020-05-04 MED ORDER — CEPHALEXIN 500 MG PO CAPS: 500.0000 mg | ORAL_CAPSULE | Freq: Two times a day (BID) | ORAL | Status: AC

## 2020-05-04 MED ORDER — POTASSIUM CHLORIDE ER 20 MEQ PO TBCR: 1.0000 | EXTENDED_RELEASE_TABLET | ORAL | Status: AC

## 2020-05-04 MED ORDER — SODIUM BICARBONATE 650 MG PO TABS: 1300.0000 mg | ORAL_TABLET | Freq: Three times a day (TID) | ORAL | Status: AC

## 2020-05-04 MED ORDER — SODIUM BICARBONATE 8.4 % IV SOLN
INTRAVENOUS | Status: DC
  Filled 2020-05-04 (×5): qty 150

## 2020-05-04 MED ORDER — SODIUM BICARBONATE 8.4 % IV SOLN
INTRAVENOUS | Status: DC
  Filled 2020-05-04 (×2): qty 1000

## 2020-05-04 MED ORDER — DEXTROSE 50 % IV SOLN
INTRAVENOUS | Status: AC
  Administered 2020-05-04: 50 mL
  Filled 2020-05-04: qty 50

## 2020-05-04 MED ORDER — LOSARTAN POTASSIUM 25 MG PO TABS: 25.0000 mg | ORAL_TABLET | Freq: Every day | ORAL | Status: AC

## 2020-05-04 MED ORDER — ONDANSETRON HCL 4 MG/2ML IJ SOLN
4.0000 mg | Freq: Four times a day (QID) | INTRAMUSCULAR | Status: DC | PRN
  Administered 2020-05-04: 4 mg via INTRAVENOUS
  Filled 2020-05-04: qty 2

## 2020-05-04 MED ORDER — FUROSEMIDE 20 MG PO TABS: 20.0000 mg | ORAL_TABLET | Freq: Every day | ORAL | Status: AC

## 2020-05-04 MED ORDER — SODIUM BICARBONATE-DEXTROSE 150-5 MEQ/L-% IV SOLN
150.0000 meq | INTRAVENOUS | Status: DC
  Filled 2020-05-04: qty 1000

## 2020-05-04 NOTE — Progress Notes (Signed)
PROGRESS NOTE    [name]  RMI:[id number] DOB: [birth year] DOA: 04/30/2020 PCP: Chilton Greathouse, MD   Brief Narrative:  HPI On 04/30/2020 by Dr. Darreld Mclean [name] is a 79 y.o. 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  History is limited from patient due to dementia with acute delirium therefore is otherwise supplemented from daughter at bedside, EDP, and chart review.  Patient was recently hospitalized from 03/15/2020-03/27/2020 initially for sepsis due to unknown etiology. No obvious infectious source was found. Hospital course was complicated by acute respiratory failure with hypoxia due to aspiration pneumonia versus drug-induced pneumonitis from chemotherapy. He was eventually discharged to SNF.  Daughter states that patient was going to be transported from his SNF to an oncology appointment however on their arrival he appeared unresponsive.  Daughter was called and she says on his arrival he appeared more confused than usual.  She says that he has not been eating well over the last couple of weeks.  She says he complained of feeling generally ill but no specific complaints.  He was brought to the ED for further evaluation.  Assessment & Plan   Acute kidney injury -Suspect multifactorial including poor oral intake, hypotension, medications as well as urinary tract infection -Creatinine on admission 2.31, down to 0.84 (baseline creatinine of 0.7-0.9) -Renal ultrasound negative for hydronephrosis, enlarged prostate -Continue IV fluids -Monitor BMP -Home medications of losartan, Lasix, acyclovir and finasteride held  UTI -UA: many bacteria, >50WBC, moderate leukocytes -Urine culture >100K Ecoli, pansenisitive -Blood cultures 1/2  Positive for staph epidermidis  (likely contaminant) -Currently afebrile with no leukocytosis -Placed on IV ceftriaxone and transitioned to keflex   Hypoglycemia -Suspect secondary to poor oral intake -will place on D5 bicarb -monitor   Vomiting -patient had completed an Ensure and then vomited -will obtain Abd xray -place on antiemetics and IVF  Malnutrition -Patient with poor oral intake -suspect secondary to dementia -will place on D5 and monitor  -nutrition consulted  Hypotension -Secondary to medications in conjunction with poor oral intake -Patient was noted to be orthostatic -BP has improved  -Discontinued IVF and BP has remained stable -Of note, sepsis was not present and has been ruled out  Metabolic acidosis -likely secondary to the above -continue to monitor BMP  Dementia with acute delirium  -Acute delirium in the setting of UTI -Continue to monitor  Atrial fibrillation/complete heart block status post pacemaker placement -CHA2DS2-VASc score 5 -Continue Xarelto -Currently in paced rhythm  CAD -Patient unable to tell me if he is having any chest pain -Continue Xarelto, statin  Essential hypertension -Patient currently hypotensive at this time -Hold Lasix, losartan  Hyperlipidemia -Continue statin  Generalized weakness -PT and OT recommending SNF  Subacute nondisplaced left fourth rib fracture -Continue supportive care -Encourage incentive spirometry  OSA -Continue CPAP  BPH -Noted on renal ultrasound -Finasteride held due to hypotension  DVT Prophylaxis Xarelto  Code Status: DNR  Family Communication: None at bedside  Disposition Plan:  Status is: Inpatient  Remains inpatient appropriate because: patient now with hypoglycemia and vomiting.    Dispo: The patient is from: SNF              Anticipated d/c is to: SNF              Anticipated d/c date is: 1  days              Patient currently is not medically stable to d/c.   Consultants None  Procedures   Renal US  Antibiotics   Anti-infectives (From admission, onward)   Start     Dose/Rate Route Frequency Ordered Stop   05/04/20 0000  cephALEXin (KEFLEX) 500 MG capsule        500 mg Oral Every 12 hours 05/04/20 1512 May 17, 2020 2359   05/03/20 2200  cephALEXin (KEFLEX) capsule 500 mg        500 mg Oral Every 12 hours 05/03/20 1045     05/01/20 1300  cefTRIAXone (ROCEPHIN) 1 g in sodium chloride 0.9 % 100 mL IVPB  Status:  Discontinued        1 g 200 mL/hr over 30 Minutes Intravenous Every 24 hours 04/30/20 1915 04/30/20 2000   05/01/20 0100  cefTRIAXone (ROCEPHIN) 1 g in sodium chloride 0.9 % 100 mL IVPB  Status:  Discontinued        1 g 200 mL/hr over 30 Minutes Intravenous Every 24 hours 04/30/20 2000 05/03/20 1045   04/30/20 1315  vancomycin (VANCOREADY) IVPB 2000 mg/400 mL        2,000 mg 200 mL/hr over 120 Minutes Intravenous  Once 04/30/20 1301 04/30/20 1703   04/30/20 1315  ceFEPIme (MAXIPIME) 2 g in sodium chloride 0.9 % 100 mL IVPB        2 g 200 mL/hr over 30 Minutes Intravenous  Once 04/30/20 1301 04/30/20 1359      Subjective:   [name] seen and examined today.  Patient with dementia. Has no complaints this morning.    Objective:   Vitals:   05/03/20 1919 05/03/20 2353 05/04/20 0247 05/04/20 0748  BP: 138/85 114/69 119/61 (!) 122/51  Pulse: 72 74 72 70  Resp: _0 Temp: 98 F (36.7 C) 98.7 F (37.1 C) 98.6 F (37 C) (!) 97.3 F (36.3 C)  TempSrc: Oral Oral Tympanic Oral  SpO2: 98% 98% 98% 96%  Weight:   100.2 kg   Height:        Intake/Output Summary (Last 24 hours) at 05/04/2020 1709 Last data filed at 05/04/2020 0800 Gross per 24 hour  Intake 240 ml  Output 400 ml  Net -160 ml   Filed Weights   05/02/20 0512 05/03/20 0258 05/04/20 0247  Weight: 101.6 kg 100.7 kg 100.2 kg    Exam  General: Well developed, edlerly, chronically ill appearing, NAD  HEENT: NCAT,  mucous membranes moist.   Cardiovascular: S1 S2 auscultated,  RRR  Respiratory: diminished breath sounds, no wheezing  Abdomen: Soft, nontender, nondistended, + bowel sounds  Extremities: warm dry without cyanosis clubbing or edema  Neuro: AAOx2 (self and place), dementia, nonfocal  Psych: pleasantly confused   Data Reviewed: I have personally reviewed following labs and imaging studies  CBC: Recent Labs  Lab 04/30/20 1657 05/01/20 0449 05/03/20 0422  WBC 6.9 5.4 5.2  HGB 10.3* 11.0* 11.5*  HCT 32.8* 34.8* 34.5*  MCV 103.5* 102.4* 99.4  PLT 204 233 500   Basic Metabolic Panel: Recent Labs  Lab 04/30/20 1257 05/01/20 0449 05/02/20 0220 05/03/20 0422 05/04/20 0642  NA 136 135 141 141 142  K 3.6 4.7 4.3 3.9 4.0  CL 101 107 111 111 110  CO2 19* 17* 15* 15* 12*  GLUCOSE 104* 79 72 65* 56*  BUN _1 CREATININE 2.31* 1.65* 1.06 0.84 0.93  CALCIUM 9.2 8.4* 8.9 8.8* 9.2  MG 2.0  --   --   --   --    GFR: Estimated Creatinine Clearance: 76.4 mL/min (by C-G formula based on SCr of 0.93 mg/dL). Liver Function Tests: Recent Labs  Lab 04/30/20 1257  AST 21  ALT 9  ALKPHOS 62  BILITOT 1.6*  PROT 6.1*  ALBUMIN 3.0*   No results for input(s): LIPASE, AMYLASE in the last 168 hours. No results for input(s): AMMONIA in the last 168 hours. Coagulation Profile: Recent Labs  Lab 04/30/20 1257  INR 2.6*   Cardiac Enzymes: No results for input(s): CKTOTAL, CKMB, CKMBINDEX, TROPONINI in the last 168 hours. BNP (last 3 results) No results for input(s): PROBNP in the last 8760 hours. HbA1C: No results for input(s): HGBA1C in the last 72 hours. CBG: Recent Labs  Lab 04/30/20 1235 05/04/20 1608 05/04/20 1625 05/04/20 1656  GLUCAP 98 48* 57* 49*   Lipid Profile: No results for input(s): CHOL, HDL, LDLCALC, TRIG, CHOLHDL, LDLDIRECT in the last 72 hours. Thyroid Function Tests: No results for input(s): TSH, T4TOTAL, FREET4, T3FREE, THYROIDAB in the last 72 hours. Anemia Panel: No results for input(s): VITAMINB12,  FOLATE, FERRITIN, TIBC, IRON, RETICCTPCT in the last 72 hours. Urine analysis:    Component Value Date/Time   COLORURINE YELLOW 04/30/2020 1705   APPEARANCEUR CLOUDY (A) 04/30/2020 1705   LABSPEC 1.010 04/30/2020 1705   PHURINE 5.0 04/30/2020 1705   GLUCOSEU NEGATIVE 04/30/2020 1705   HGBUR MODERATE (A) 04/30/2020 1705   BILIRUBINUR NEGATIVE 04/30/2020 1705   KETONESUR 5 (A) 04/30/2020 1705   PROTEINUR 100 (A) 04/30/2020 1705   UROBILINOGEN 0.2 04/28/2013 1949   NITRITE NEGATIVE 04/30/2020 1705   LEUKOCYTESUR MODERATE (A) 04/30/2020 1705   Sepsis Labs: _0 (procalcitonin:4,lacticidven:4)  ) Recent Results (from the past 240 hour(s))  Culture, blood (Routine x 2)     Status: None (Preliminary result)   Collection Time: 04/30/20 12:45 PM   Specimen: BLOOD  Result Value Ref Range Status   Specimen Description BLOOD LEFT ANTECUBITAL  Final   Special Requests   Final    BOTTLES DRAWN AEROBIC AND ANAEROBIC Blood Culture adequate volume   Culture   Final    NO GROWTH 4 DAYS Performed at [facility], [place] [address] [place], [place] [address]    Report Status PENDING  Incomplete  Urine culture     Status: Abnormal   Collection Time: 04/30/20 12:58 PM   Specimen: Urine, Random  Result Value Ref Range Status   Specimen Description URINE, RANDOM  Final   Special Requests   Final    NONE Performed at [facility], [place] [address] [place], [place] [address]    Culture >=100,000 COLONIES/mL ESCHERICHIA COLI (A)  Final   Report Status 05/02/2020 FINAL  Final   Organism ID, Bacteria ESCHERICHIA COLI (A)  Final      Susceptibility   Escherichia coli - MIC*    AMPICILLIN 4 SENSITIVE Sensitive     CEFAZOLIN <=4 SENSITIVE Sensitive     CEFTRIAXONE <=0.25 SENSITIVE Sensitive     CIPROFLOXACIN <=0.25 SENSITIVE Sensitive     GENTAMICIN <=1 SENSITIVE Sensitive     IMIPENEM <=0.25 SENSITIVE Sensitive     NITROFURANTOIN <=16 SENSITIVE Sensitive     TRIMETH/SULFA <=20  SENSITIVE Sensitive     AMPICILLIN/SULBACTAM <=2 SENSITIVE Sensitive     PIP/TAZO <=4 SENSITIVE Sensitive     * >=100,000 COLONIES/mL ESCHERICHIA COLI  Culture, blood (Routine x  2)     Status: Abnormal   Collection Time: 04/30/20  1:00 PM   Specimen: BLOOD  Result Value Ref Range Status   Specimen Description BLOOD RIGHT ANTECUBITAL  Final   Special Requests   Final    BOTTLES DRAWN AEROBIC AND ANAEROBIC Blood Culture adequate volume   Culture  Setup Time   Final    GRAM POSITIVE COCCI IN CLUSTERS AEROBIC BOTTLE ONLY Organism ID to follow CRITICAL RESULT CALLED TO, READ BACK BY AND VERIFIED WITH: Erin Hearing PHARMD _0  05/01/20 EB    Culture (A)  Final    STAPHYLOCOCCUS EPIDERMIDIS THE SIGNIFICANCE OF ISOLATING THIS ORGANISM FROM A SINGLE SET OF BLOOD CULTURES WHEN MULTIPLE SETS ARE DRAWN IS UNCERTAIN. PLEASE NOTIFY THE MICROBIOLOGY DEPARTMENT WITHIN ONE WEEK IF SPECIATION AND SENSITIVITIES ARE REQUIRED. Performed at [facility], [place] [address], [place], [place] [address]    Report Status 05/03/2020 FINAL  Final  Blood Culture ID Panel (Reflexed)     Status: Abnormal   Collection Time: 04/30/20  1:00 PM  Result Value Ref Range Status   Enterococcus faecalis NOT DETECTED NOT DETECTED Final   Enterococcus Faecium NOT DETECTED NOT DETECTED Final   Listeria monocytogenes NOT DETECTED NOT DETECTED Final   Staphylococcus species DETECTED (A) NOT DETECTED Final    Comment: RESULT CALLED TO, READ BACK BY AND VERIFIED WITH: Erin Hearing PHARMD _1  05/01/20 EB    Staphylococcus aureus (BCID) NOT DETECTED NOT DETECTED Final   Staphylococcus epidermidis DETECTED (A) NOT DETECTED Final    Comment: Methicillin (oxacillin) resistant coagulase negative staphylococcus. Possible blood culture contaminant (unless isolated from more than one blood culture draw or clinical case suggests pathogenicity). No antibiotic treatment is indicated for blood  culture contaminants. RESULT CALLED TO,  READ BACK BY AND VERIFIED WITH: Erin Hearing PHARMD _2  05/01/20 EB    Staphylococcus lugdunensis NOT DETECTED NOT DETECTED Final   Streptococcus species NOT DETECTED NOT DETECTED Final   Streptococcus agalactiae NOT DETECTED NOT DETECTED Final   Streptococcus pneumoniae NOT DETECTED NOT DETECTED Final   Streptococcus pyogenes NOT DETECTED NOT DETECTED Final   A.calcoaceticus-baumannii NOT DETECTED NOT DETECTED Final   Bacteroides fragilis NOT DETECTED NOT DETECTED Final   Enterobacterales NOT DETECTED NOT DETECTED Final   Enterobacter cloacae complex NOT DETECTED NOT DETECTED Final   Escherichia coli NOT DETECTED NOT DETECTED Final   Klebsiella aerogenes NOT DETECTED NOT DETECTED Final   Klebsiella oxytoca NOT DETECTED NOT DETECTED Final   Klebsiella pneumoniae NOT DETECTED NOT DETECTED Final   Proteus species NOT DETECTED NOT DETECTED Final   Salmonella species NOT DETECTED NOT DETECTED Final   Serratia marcescens NOT DETECTED NOT DETECTED Final   Haemophilus influenzae NOT DETECTED NOT DETECTED Final   Neisseria meningitidis NOT DETECTED NOT DETECTED Final   Pseudomonas aeruginosa NOT DETECTED NOT DETECTED Final   Stenotrophomonas maltophilia NOT DETECTED NOT DETECTED Final   Candida albicans NOT DETECTED NOT DETECTED Final   Candida auris NOT DETECTED NOT DETECTED Final   Candida glabrata NOT DETECTED NOT DETECTED Final   Candida krusei NOT DETECTED NOT DETECTED Final   Candida parapsilosis NOT DETECTED NOT DETECTED Final   Candida tropicalis NOT DETECTED NOT DETECTED Final   Cryptococcus neoformans/gattii NOT DETECTED NOT DETECTED Final   Methicillin resistance mecA/C DETECTED (A) NOT DETECTED Final    Comment: RESULT CALLED TO, READ BACK BY AND VERIFIED WITH: Erin Hearing PHARMD _3  05/01/20 EB Performed at [facility], [address] [place], [place]  [address]   SARS Coronavirus 2 by RT PCR (hospital order, performed in [facility] hospital lab) Nasopharyngeal  Nasopharyngeal Swab     Status: None   Collection Time: 04/30/20  1:06 PM   Specimen: Nasopharyngeal Swab  Result Value Ref Range Status   SARS Coronavirus 2 NEGATIVE NEGATIVE Final    Comment: (NOTE) SARS-CoV-2 target nucleic acids are NOT DETECTED.  The SARS-CoV-2 RNA is generally detectable in upper and lower respiratory specimens during the acute phase of infection. The lowest concentration of SARS-CoV-2 viral copies this assay can detect is 250 copies / mL. A negative result does not preclude SARS-CoV-2 infection and should not be used as the sole basis for treatment or other patient management decisions.  A negative result may occur with improper specimen collection / handling, submission of specimen other than nasopharyngeal swab, presence of viral mutation(s) within the areas targeted by this assay, and inadequate number of viral copies (<250 copies / mL). A negative result must be combined with clinical observations, patient history, and epidemiological information.  Fact Sheet for Patients:   StrictlyIdeas.no  Fact Sheet for Healthcare Providers: BankingDealers.co.za  This test is not yet approved or  cleared by the [place] FDA and has been authorized for detection and/or diagnosis of SARS-CoV-2 by FDA under an Emergency Use Authorization (EUA).  This EUA will remain in effect (meaning this test can be used) for the duration of the COVID-19 declaration under Section 564(b)(1) of the Act, 21 U.S.C. section 360bbb-3(b)(1), unless the authorization is terminated or revoked sooner.  Performed at [facility], [place] [address], [place], [place] [address]   MRSA PCR Screening     Status: Abnormal   Collection Time: 04/30/20  9:00 PM   Specimen: Nasal Mucosa; Nasopharyngeal  Result Value Ref Range Status   MRSA by PCR POSITIVE (A) NEGATIVE Final    Comment:        The GeneXpert MRSA Assay (FDA approved for NASAL  specimens only), is one component of a comprehensive MRSA colonization surveillance program. It is not intended to diagnose MRSA infection nor to guide or monitor treatment for MRSA infections. RESULT CALLED TO, READ BACK BY AND VERIFIED WITH: L LOW RN 05/01/20  0019 JDW Performed at [facility], [place] [address], [place], [place] [address]   SARS Coronavirus 2 by RT PCR (hospital order, performed in [facility] hospital lab) Nasopharyngeal Nasopharyngeal Swab     Status: None   Collection Time: 05/04/20 12:55 PM   Specimen: Nasopharyngeal Swab  Result Value Ref Range Status   SARS Coronavirus 2 NEGATIVE NEGATIVE Final    Comment: (NOTE) SARS-CoV-2 target nucleic acids are NOT DETECTED.  The SARS-CoV-2 RNA is generally detectable in upper and lower respiratory specimens during the acute phase of infection. The lowest concentration of SARS-CoV-2 viral copies this assay can detect is 250 copies / mL. A negative result does not preclude SARS-CoV-2 infection and should not be used as the sole basis for treatment or other patient management decisions.  A negative result may occur with improper specimen collection / handling, submission of specimen other than nasopharyngeal swab, presence of viral mutation(s) within the areas targeted by this assay, and inadequate number of viral copies (<250 copies / mL). A negative result must be combined with clinical observations, patient history, and epidemiological information.  Fact Sheet for Patients:   StrictlyIdeas.no  Fact Sheet for Healthcare Providers: BankingDealers.co.za  This test is not yet approved or  cleared by the  [place] Architectural technologist and has been authorized for detection and/or diagnosis of SARS-CoV-2 by FDA under an Print production planner (EUA).  This EUA will remain in effect (meaning this test can be used) for the duration of the COVID-19 declaration under Section  564(b)(1) of the Act, 21 U.S.C. section 360bbb-3(b)(1), unless the authorization is terminated or revoked sooner.  Performed at [facility], [place] [address] [place], [place] [address]       Radiology Studies: No results found.   Scheduled Meds: . atorvastatin  10 mg Oral q1800  . cephALEXin  500 mg Oral Q12H  . Chlorhexidine Gluconate Cloth  6 each Topical Q0600  . melatonin  3 mg Oral QHS  . mupirocin ointment  1 application Nasal BID  . Rivaroxaban  20 mg Oral Q supper  . sodium bicarbonate  650 mg Oral BID  . sodium chloride flush  3 mL Intravenous Q12H   Continuous Infusions:    LOS: 3 days   Time Spent in minutes   30 minutes  Darwin Guastella D.O. on 05/04/2020 at 5:09 PM  Between 7am to 7pm - Please see pager noted on [email or web address]  After 7pm go to [email or web address]  And look for the night coverage person covering for me after hours  [facility] Office  [phone]

## 2020-05-04 NOTE — TOC Progression Note (Signed)
Transition of Care [facility]) - Progression Note    Patient Details  Name: [name] MRN: [id number] Date of Birth: [birth year]  Transition of Care [facility]) CM/SW [place], [place] Phone Number: 05/04/2020, 11:51 AM  Clinical Narrative:     CSW spoke with patients son Garion and daughter [name] and they are both agreeable for patient to return to [place] where he is a long term resident there. [name] with [facility] says insurance does not need to be approved for patient to return to [place].  Plan is for patient to return to [place].  CSW will continue to follow.  Expected Discharge Plan: [place] Barriers to Discharge: No Barriers Identified  Expected Discharge Plan and Services Expected Discharge Plan: [place] In-house Referral: Clinical Social Work   Post Acute Care Choice: [place] Living arrangements for the past 2 months: [place]                                       Social Determinants of Health (SDOH) Interventions    Readmission Risk Interventions Readmission Risk Prevention Plan 05/01/2020  Transportation Screening Complete  Skilled Nursing Facility Complete  Some recent data might be hidden

## 2020-05-04 NOTE — Progress Notes (Signed)
Attempted to call [facility] to notify that discharge to facility has been cancelled at this time. No answer at facility.

## 2020-05-04 NOTE — Progress Notes (Signed)
Report called to [place] at [facility]. Patient notified of transfer.  No belongings at bedside.  Awaiting PTAR for transfer to [place].

## 2020-05-04 NOTE — Progress Notes (Signed)
Hypoglycemic Event  CBG: 45  Treatment: patient given juice and ensure Symptoms: none noted, patient confused but this is his baseline  Follow-up CBG: Time: 1625, 1656hrs CBG Result:57 and 49 Possible Reasons for Event:poor appetite, low intake.  Comments/MD notified: Dr. Ree Kida notified, patient had large emesis after ensure.  Will hold discharge at this time.  Will start IV dextrose per order and give zofran for nausea.     Myrtis Hopping

## 2020-05-04 NOTE — Discharge Summary (Deleted)
Physician Discharge Summary  [name] IHW:[id number] DOB: 1941/01/24 DOA: 04/30/2020  PCP: [name], MD  Admit date: 04/30/2020 Discharge date: 05/04/2020  Time spent: 45 minutes  Recommendations for Outpatient Follow-up:  Patient will be discharged to skill nursing facility, continue physical and occupational therapy.  Patient will need to follow up with primary care provider within one week of discharge, recheck BMP.  Patient should continue medications as prescribed.  Patient should follow a dysphagia 1 diet.    Discharge Diagnoses:  Acute kidney injury UTI Hypotension Metabolic acidosis Dementia with acute delirium  Atrial fibrillation/complete heart block status post pacemaker placement CAD Essential hypertension Hyperlipidemia Generalized weakness Subacute nondisplaced left fourth rib fracture OSA BPH  Discharge Condition: stable  Diet recommendation: dysphagia 1  Filed Weights   05/02/20 0512 05/03/20 0258 05/04/20 0247  Weight: 101.6 kg 100.7 kg 100.2 kg    History of present illness:  On 04/30/2020 by Dr. Carrolyn Leigh an 79 y.o.malewith medical history significant forparoxysmal A. fib/flutter on Xarelto, CHB s/p PPM, CAD, multiple myeloma, hypertension, hyperlipidemia, BPH, OSA,very hard of hearing,and dementia who presents to the ED for evaluation of acute encephalopathy.History is limited from patient due to dementia with acute delirium therefore is otherwise supplemented from daughter at bedside, EDP, and chart review.  Patient was recently hospitalized from 03/15/2020-03/27/2020 initially for sepsis due to unknown etiology. No obvious infectious source was found. Hospital course was complicated by acute respiratory failure with hypoxia due to aspiration pneumonia versus drug-induced pneumonitis from chemotherapy. He was eventually discharged to SNF.  Daughter states that patient was going to be transported from his SNF to an  oncology appointment however on their arrival he appeared unresponsive. Daughter was called and she says on his arrival he appeared more confused than usual. She says that he has not been eating well over the last couple of weeks. She says he complained of feeling generally ill but no specific complaints. He was brought to the ED for further evaluation.  Hospital Course:  Acute kidney injury -Suspect multifactorial including poor oral intake, hypotension, medications as well as urinary tract infection -Creatinine on admission 2.31, down to 0.9 (baseline creatinine of 0.7-0.9) -Renal ultrasound negative for hydronephrosis, enlarged prostate -was placed on IVF -Home medications of losartan, Lasix, acyclovir and finasteride held  UTI -UA: many bacteria, >50WBC, moderate leukocytes -Urine culture >100K Ecoli, pansenisitive -Blood cultures 1/2  Positive for staph epidermidis (likely contaminant) -Currently afebrile with no leukocytosis -Placed on IV ceftriaxone and transitioned to keflex   Hypotension -Secondary to medications in conjunction with poor oral intake -Patient was noted to be orthostatic -BP has improved  -Discontinued IVF and BP has remained stable -Of note, sepsis was not present and has been ruled out  Metabolic acidosis -likely secondary to the above -with anion gap today, however patient with no respiratory symptoms or diarrhea -Discussed with nephrology, Dr. Carolin Sicks, recommended 1339m TID of sodium bicarb and recheck in one week. Would not further work up at this time.  -Repeat BMP in one week  Dementia with acute delirium  -Acute delirium in the setting of UTI -Stable  Atrial fibrillation/complete heart block status post pacemaker placement -CHA2DS2-VASc score 5 -Continue Xarelto -Currently in paced rhythm  CAD -Patient unable to tell me if he is having any chest pain -Continue Xarelto, statin  Essential hypertension -Patient currently  hypotensive at this time -Hold Lasix, losartan- may resume at reduced doses  Hyperlipidemia -Continue statin  Generalized weakness -PT and OT recommending  SNF  Subacute nondisplaced left fourth rib fracture -Continue supportive care -Encourage incentive spirometry  OSA -Continue CPAP  BPH -Noted on renal ultrasound -Finasteride held due to hypotension  Consultants None  Procedures  Renal US  Discharge Exam: Vitals:   05/04/20 0247 05/04/20 0748  BP: 119/61 (!) 122/51  Pulse: 72 70  Resp: 18 20  Temp: 98.6 F (37 C) (!) 97.3 F (36.3 C)  SpO2: 98% 96%     General: Well developed, well nourished, elderly, NAD  HEENT: NCAT,  mucous membranes moist.   Cardiovascular: S1 S2 auscultated, RRR  Respiratory: Clear to auscultation bilaterally, no wheezing   Abdomen: Soft, nontender, nondistended, + bowel sounds  Extremities: warm dry without cyanosis clubbing or edema  Neuro: AAOx2 (self and place), dementia, nonfocal  Psych: pleasantly confused  Discharge Instructions Discharge Instructions    Discharge instructions   Complete by: As directed    Patient will be discharged to skill nursing facility, continue physical and occupational therapy.  Patient will need to follow up with primary care provider within one week of discharge, recheck BMP.  Patient should continue medications as prescribed.  Patient should follow a dysphagia 1 diet.   Discharge wound care:   Complete by: As directed    Keep dry and clean     Allergies as of 05/04/2020      Reactions   Bee Venom Anaphylaxis         Medication List    STOP taking these medications   Magnesium 200 MG Tabs     TAKE these medications   acyclovir 200 MG capsule Commonly known as: ZOVIRAX Take 1 capsule (200 mg total) by mouth 2 (two) times daily.   Alaway 0.025 % ophthalmic solution Generic drug: ketotifen Place 1 drop into both eyes 2 (two) times daily as needed (for dry eyes).     atorvastatin 10 MG tablet Commonly known as: LIPITOR Take 10 mg by mouth daily at 6 PM.   cephALEXin 500 MG capsule Commonly known as: KEFLEX Take 1 capsule (500 mg total) by mouth every 12 (twelve) hours for 4 days.   EPINEPHrine 0.3 mg/0.3 mL Soaj injection Commonly known as: EPI-PEN Inject 0.3 mg into the muscle once as needed for anaphylaxis.   finasteride 5 MG tablet Commonly known as: PROSCAR Take 5 mg by mouth daily.   furosemide 20 MG tablet Commonly known as: LASIX Take 1 tablet (20 mg total) by mouth daily. What changed:   medication strength  how much to take   losartan 25 MG tablet Commonly known as: COZAAR Take 1 tablet (25 mg total) by mouth daily. What changed:   medication strength  how much to take   melatonin 3 MG Tabs tablet Take 1 tablet (3 mg total) by mouth at bedtime.   multivitamin capsule Take 1 capsule by mouth daily.   mupirocin ointment 2 % Commonly known as: BACTROBAN Apply 1 application topically daily. Apply with dressing change   nitroGLYCERIN 0.4 MG SL tablet Commonly known as: NITROSTAT PLACE ONE TABLET UNDER THE TONGUE EVERY 5 MINUTES AS NEEDED FOR CHEST PAIN What changed: See the new instructions.   Potassium Chloride ER 20 MEQ Tbcr Take 1 tablet by mouth every other day. What changed: when to take this   prochlorperazine 10 MG tablet Commonly known as: COMPAZINE Take 1 tablet (10 mg total) by mouth every 6 (six) hours as needed for nausea or vomiting.   sodium bicarbonate 650 MG tablet Take 2 tablets (1,300  mg total) by mouth 3 (three) times daily. Take for 1 week, then check BMP   Xarelto 20 MG Tabs tablet Generic drug: rivaroxaban TAKE 1 TABLET BY MOUTH ONCE DAILY WITH SUPPER What changed: See the new instructions.            Discharge Care Instructions  (From admission, onward)         Start     Ordered   05/04/20 0000  Discharge wound care:       Comments: Keep dry and clean   05/04/20 1512          Allergies  Allergen Reactions  . Bee Venom Anaphylaxis         Contact information for after-discharge care    Destination    [facility] .   Service: Skilled Nursing Contact information: [place] [phone]                   The results of significant diagnostics from this hospitalization (including imaging, microbiology, ancillary and laboratory) are listed below for reference.    Significant Diagnostic Studies: DG Chest 1 View  Result Date: 04/30/2020 CLINICAL DATA:  Hypotension EXAM: CHEST  1 VIEW COMPARISON:  03/25/2020 FINDINGS: Left-sided implanted cardiac device, unchanged. The heart size and mediastinal contours are within normal limits. Atherosclerotic calcification of the aortic knob. Low lung volumes with streaky left basilar opacity, favoring atelectasis. Lungs appear otherwise clear. No pleural fluid collection. No pneumothorax. Subacute appearing nondisplaced fracture of the posterior left fourth rib. IMPRESSION: 1. Low lung volumes with streaky left basilar opacity, favoring atelectasis. 2. Subacute appearing nondisplaced fracture of the posterior left fourth rib. Electronically Signed   By: Davina Poke D.O.   On: 04/30/2020 13:20   US RENAL  Result Date: 04/30/2020 CLINICAL DATA:  Acute kidney injury EXAM: RENAL / URINARY TRACT ULTRASOUND COMPLETE COMPARISON:  CT 03/15/2020 FINDINGS: Right Kidney: Renal measurements: 12.7 x 6.1 x 5 cm = volume: 204.9 mL. Echogenicity within normal limits. No mass or hydronephrosis visualized. Left Kidney: Renal measurements: 12.3 x 6.4 x 5 cm = volume: 202.4 mL. Echogenicity within normal limits. Small cyst exophytic to the lower pole measuring 1.6 cm. Bladder: Appears normal for degree of bladder distention. Other: Enlarged prostate with volume of 76.8 mL. IMPRESSION: 1. Negative for hydronephrosis. 2. Enlarged prostate Electronically Signed   By: Donavan Foil M.D.   On: 04/30/2020 20:16   CUP PACEART REMOTE DEVICE CHECK  Result Date: 04/26/2020 Billable dc pm transmission.  Normal device function. VP 99%.  AT/AF >99%, V rates controlled.  EMR med list includes Xarelto. [place]   Microbiology: Recent Results (from the past 240 hour(s))  Culture, blood (Routine x 2)     Status: None (Preliminary result)   Collection Time: 04/30/20 12:45 PM   Specimen: BLOOD  Result Value Ref Range Status   Specimen Description BLOOD LEFT ANTECUBITAL  Final   Special Requests   Final    BOTTLES DRAWN AEROBIC AND ANAEROBIC Blood Culture adequate volume   Culture   Final    NO GROWTH 4 DAYS Performed at [facility], [address], [place], [place] [address]    Report Status PENDING  Incomplete  Urine culture     Status: Abnormal   Collection Time: 04/30/20 12:58 PM   Specimen: Urine, Random  Result Value Ref Range Status   Specimen Description URINE, RANDOM  Final   Special Requests   Final  NONE Performed at [facility], [place] [address] [place], [place] [address]    Culture >=100,000 COLONIES/mL ESCHERICHIA COLI (A)  Final   Report Status 05/02/2020 FINAL  Final   Organism ID, Bacteria ESCHERICHIA COLI (A)  Final      Susceptibility   Escherichia coli - MIC*    AMPICILLIN 4 SENSITIVE Sensitive     CEFAZOLIN <=4 SENSITIVE Sensitive     CEFTRIAXONE <=0.25 SENSITIVE Sensitive     CIPROFLOXACIN <=0.25 SENSITIVE Sensitive     GENTAMICIN <=1 SENSITIVE Sensitive     IMIPENEM <=0.25 SENSITIVE Sensitive     NITROFURANTOIN <=16 SENSITIVE Sensitive     TRIMETH/SULFA <=20 SENSITIVE Sensitive     AMPICILLIN/SULBACTAM <=2 SENSITIVE Sensitive     PIP/TAZO <=4 SENSITIVE Sensitive     * >=100,000 COLONIES/mL ESCHERICHIA COLI  Culture, blood (Routine x 2)     Status: Abnormal   Collection Time: 04/30/20  1:00 PM   Specimen: BLOOD  Result Value Ref Range Status   Specimen Description BLOOD RIGHT ANTECUBITAL  Final   Special Requests    Final    BOTTLES DRAWN AEROBIC AND ANAEROBIC Blood Culture adequate volume   Culture  Setup Time   Final    GRAM POSITIVE COCCI IN CLUSTERS AEROBIC BOTTLE ONLY Organism ID to follow CRITICAL RESULT CALLED TO, READ BACK BY AND VERIFIED WITH: Erin Hearing PHARMD @2132  05/01/20 EB    Culture (A)  Final    STAPHYLOCOCCUS EPIDERMIDIS THE SIGNIFICANCE OF ISOLATING THIS ORGANISM FROM A SINGLE SET OF BLOOD CULTURES WHEN MULTIPLE SETS ARE DRAWN IS UNCERTAIN. PLEASE NOTIFY THE MICROBIOLOGY DEPARTMENT WITHIN ONE WEEK IF SPECIATION AND SENSITIVITIES ARE REQUIRED. Performed at [facility], [place] [address] [place], [place] [address]    Report Status 05/03/2020 FINAL  Final  Blood Culture ID Panel (Reflexed)     Status: Abnormal   Collection Time: 04/30/20  1:00 PM  Result Value Ref Range Status   Enterococcus faecalis NOT DETECTED NOT DETECTED Final   Enterococcus Faecium NOT DETECTED NOT DETECTED Final   Listeria monocytogenes NOT DETECTED NOT DETECTED Final   Staphylococcus species DETECTED (A) NOT DETECTED Final    Comment: RESULT CALLED TO, READ BACK BY AND VERIFIED WITH: Erin Hearing PHARMD @2132  05/01/20 EB    Staphylococcus aureus (BCID) NOT DETECTED NOT DETECTED Final   Staphylococcus epidermidis DETECTED (A) NOT DETECTED Final    Comment: Methicillin (oxacillin) resistant coagulase negative staphylococcus. Possible blood culture contaminant (unless isolated from more than one blood culture draw or clinical case suggests pathogenicity). No antibiotic treatment is indicated for blood  culture contaminants. RESULT CALLED TO, READ BACK BY AND VERIFIED WITH: Erin Hearing PHARMD @2132  05/01/20 EB    Staphylococcus lugdunensis NOT DETECTED NOT DETECTED Final   Streptococcus species NOT DETECTED NOT DETECTED Final   Streptococcus agalactiae NOT DETECTED NOT DETECTED Final   Streptococcus pneumoniae NOT DETECTED NOT DETECTED Final   Streptococcus pyogenes NOT DETECTED NOT DETECTED Final    A.calcoaceticus-baumannii NOT DETECTED NOT DETECTED Final   Bacteroides fragilis NOT DETECTED NOT DETECTED Final   Enterobacterales NOT DETECTED NOT DETECTED Final   Enterobacter cloacae complex NOT DETECTED NOT DETECTED Final   Escherichia coli NOT DETECTED NOT DETECTED Final   Klebsiella aerogenes NOT DETECTED NOT DETECTED Final   Klebsiella oxytoca NOT DETECTED NOT DETECTED Final   Klebsiella pneumoniae NOT DETECTED NOT DETECTED Final   Proteus species NOT DETECTED NOT DETECTED Final   Salmonella species NOT DETECTED NOT DETECTED Final  Serratia marcescens NOT DETECTED NOT DETECTED Final   Haemophilus influenzae NOT DETECTED NOT DETECTED Final   Neisseria meningitidis NOT DETECTED NOT DETECTED Final   Pseudomonas aeruginosa NOT DETECTED NOT DETECTED Final   Stenotrophomonas maltophilia NOT DETECTED NOT DETECTED Final   Candida albicans NOT DETECTED NOT DETECTED Final   Candida auris NOT DETECTED NOT DETECTED Final   Candida glabrata NOT DETECTED NOT DETECTED Final   Candida krusei NOT DETECTED NOT DETECTED Final   Candida parapsilosis NOT DETECTED NOT DETECTED Final   Candida tropicalis NOT DETECTED NOT DETECTED Final   Cryptococcus neoformans/gattii NOT DETECTED NOT DETECTED Final   Methicillin resistance mecA/C DETECTED (A) NOT DETECTED Final    Comment: RESULT CALLED TO, READ BACK BY AND VERIFIED WITH: Erin Hearing PHARMD @2132  05/01/20 EB Performed at [facility], [address] [place], [place] [address]   SARS Coronavirus 2 by RT PCR (hospital order, performed in [facility] hospital lab) Nasopharyngeal Nasopharyngeal Swab     Status: None   Collection Time: 04/30/20  1:06 PM   Specimen: Nasopharyngeal Swab  Result Value Ref Range Status   SARS Coronavirus 2 NEGATIVE NEGATIVE Final    Comment: (NOTE) SARS-CoV-2 target nucleic acids are NOT DETECTED.  The SARS-CoV-2 RNA is generally detectable in upper and lower respiratory specimens during the acute phase of  infection. The lowest concentration of SARS-CoV-2 viral copies this assay can detect is 250 copies / mL. A negative result does not preclude SARS-CoV-2 infection and should not be used as the sole basis for treatment or other patient management decisions.  A negative result may occur with improper specimen collection / handling, submission of specimen other than nasopharyngeal swab, presence of viral mutation(s) within the areas targeted by this assay, and inadequate number of viral copies (<250 copies / mL). A negative result must be combined with clinical observations, patient history, and epidemiological information.  Fact Sheet for Patients:   StrictlyIdeas.no  Fact Sheet for Healthcare Providers: BankingDealers.co.za  This test is not yet approved or  cleared by the [place] FDA and has been authorized for detection and/or diagnosis of SARS-CoV-2 by FDA under an Emergency Use Authorization (EUA).  This EUA will remain in effect (meaning this test can be used) for the duration of the COVID-19 declaration under Section 564(b)(1) of the Act, 21 U.S.C. section 360bbb-3(b)(1), unless the authorization is terminated or revoked sooner.  Performed at [facility], [place] [address], [place], [place] [address]   MRSA PCR Screening     Status: Abnormal   Collection Time: 04/30/20  9:00 PM   Specimen: Nasal Mucosa; Nasopharyngeal  Result Value Ref Range Status   MRSA by PCR POSITIVE (A) NEGATIVE Final    Comment:        The GeneXpert MRSA Assay (FDA approved for NASAL specimens only), is one component of a comprehensive MRSA colonization surveillance program. It is not intended to diagnose MRSA infection nor to guide or monitor treatment for MRSA infections. RESULT CALLED TO, READ BACK BY AND VERIFIED WITH: L LOW RN 05/01/20  0019 JDW Performed at [facility], [place] [address] [place], [place] [address]   SARS  Coronavirus 2 by RT PCR (hospital order, performed in [facility] hospital lab) Nasopharyngeal Nasopharyngeal Swab     Status: None   Collection Time: 05/04/20 12:55 PM   Specimen: Nasopharyngeal Swab  Result Value Ref Range Status   SARS Coronavirus 2 NEGATIVE NEGATIVE Final    Comment: (NOTE)  SARS-CoV-2 target nucleic acids are NOT DETECTED.  The SARS-CoV-2 RNA is generally detectable in upper and lower respiratory specimens during the acute phase of infection. The lowest concentration of SARS-CoV-2 viral copies this assay can detect is 250 copies / mL. A negative result does not preclude SARS-CoV-2 infection and should not be used as the sole basis for treatment or other patient management decisions.  A negative result may occur with improper specimen collection / handling, submission of specimen other than nasopharyngeal swab, presence of viral mutation(s) within the areas targeted by this assay, and inadequate number of viral copies (<250 copies / mL). A negative result must be combined with clinical observations, patient history, and epidemiological information.  Fact Sheet for Patients:   StrictlyIdeas.no  Fact Sheet for Healthcare Providers: BankingDealers.co.za  This test is not yet approved or  cleared by the [place] FDA and has been authorized for detection and/or diagnosis of SARS-CoV-2 by FDA under an Emergency Use Authorization (EUA).  This EUA will remain in effect (meaning this test can be used) for the duration of the COVID-19 declaration under Section 564(b)(1) of the Act, 21 U.S.C. section 360bbb-3(b)(1), unless the authorization is terminated or revoked sooner.  Performed at [facility], [place] [address] [place], [place] [address]      Labs: Basic Metabolic Panel: Recent Labs  Lab 04/30/20 1257 05/01/20 0449 05/02/20 0220 05/03/20 0422 05/04/20 0642  NA 136 135 141 141 142  K 3.6 4.7 4.3 3.9  4.0  CL 101 107 111 111 110  CO2 19* 17* 15* 15* 12*  GLUCOSE 104* 79 72 65* 56*  BUN 18 18 16 11 8   CREATININE 2.31* 1.65* 1.06 0.84 0.93  CALCIUM 9.2 8.4* 8.9 8.8* 9.2  MG 2.0  --   --   --   --    Liver Function Tests: Recent Labs  Lab 04/30/20 1257  AST 21  ALT 9  ALKPHOS 62  BILITOT 1.6*  PROT 6.1*  ALBUMIN 3.0*   No results for input(s): LIPASE, AMYLASE in the last 168 hours. No results for input(s): AMMONIA in the last 168 hours. CBC: Recent Labs  Lab 04/30/20 1657 05/01/20 0449 05/03/20 0422  WBC 6.9 5.4 5.2  HGB 10.3* 11.0* 11.5*  HCT 32.8* 34.8* 34.5*  MCV 103.5* 102.4* 99.4  PLT 204 233 241   Cardiac Enzymes: No results for input(s): CKTOTAL, CKMB, CKMBINDEX, TROPONINI in the last 168 hours. BNP: BNP (last 3 results) Recent Labs    03/15/20 1138 03/22/20 1600  BNP 132.6* 215.1*    ProBNP (last 3 results) No results for input(s): PROBNP in the last 8760 hours.  CBG: Recent Labs  Lab 04/30/20 1235  GLUCAP 98       Signed:  [facility] 05/04/2020, 3:13 PM

## 2020-05-04 NOTE — Discharge Instructions (Addendum)

## 2020-05-04 NOTE — TOC Transition Note (Addendum)
Transition of Care [facility]) - CM/SW Discharge Note   Patient Details  Name: [name] MRN: [id number] Date of Birth: February 22, 1941  [facility] CM/SW Contact:  Trula Ore, [place] Phone Number: 05/04/2020, 3:27 PM   Clinical Narrative:      Update 8/23 5:04PM- Patient DC cancelled for today due to Nausea. Notified Facility and cancelled PTAR.Patients family was notified of DC being cancelled. CSW will re-attempt discharge when medically stable.   3:27pm- Patient will DC to: [facility]   Anticipated DC date: 05/04/2020   Family notified: Jeneen Rinks and [name]   Transport by: Corey Harold   ?  Per MD patient ready for DC to [facility] . RN, patient, patient's family, and facility notified of DC. Discharge Summary sent to facility. RN given number for report tele# [phone] RM#[phone] F. DC packet on chart. Ambulance transport requested for patient. DNR signed on chart.  CSW signing off.  Final next level of care: Skilled Nursing Facility Barriers to Discharge: No Barriers Identified   Patient Goals and CMS Choice Patient states their goals for this hospitalization and ongoing recovery are:: to go to SNF CMS Medicare.gov Compare Post Acute Care list provided to:: Patient Represenative (must comment) [name] patients daughter) Choice offered to / list presented to : Adult Children [name] patients daughter)  Discharge Placement              Patient chooses bed at:  ([facility]) Patient to be transferred to facility by: [place] Name of family member notified: [place] and [name] Patient and family notified of of transfer: 05/04/20  Discharge Plan and Services In-house Referral: Clinical Social Work   Post Acute Care Choice: [place]                               Social Determinants of Health (SDOH) Interventions     Readmission Risk Interventions Readmission Risk Prevention Plan 05/01/2020  Transportation Screening Complete  Skilled Nursing  Facility Complete  Some recent data might be hidden

## 2020-05-05 ENCOUNTER — Inpatient Hospital Stay (HOSPITAL_COMMUNITY): Payer: Medicare HMO

## 2020-05-05 LAB — GLUCOSE, CAPILLARY
Glucose-Capillary: 105 mg/dL — ABNORMAL HIGH (ref 70–99)
Glucose-Capillary: 123 mg/dL — ABNORMAL HIGH (ref 70–99)
Glucose-Capillary: 135 mg/dL — ABNORMAL HIGH (ref 70–99)
Glucose-Capillary: 154 mg/dL — ABNORMAL HIGH (ref 70–99)
Glucose-Capillary: 98 mg/dL (ref 70–99)

## 2020-05-05 LAB — CULTURE, BLOOD (ROUTINE X 2)
Culture: NO GROWTH
Special Requests: ADEQUATE

## 2020-05-05 MED ORDER — FINASTERIDE 5 MG PO TABS
5.0000 mg | ORAL_TABLET | Freq: Every day | ORAL | Status: DC
  Administered 2020-05-05 – 2020-05-06 (×2): 5 mg via ORAL
  Filled 2020-05-05 (×2): qty 1

## 2020-05-05 MED ORDER — HALOPERIDOL LACTATE 5 MG/ML IJ SOLN
5.0000 mg | Freq: Once | INTRAMUSCULAR | Status: AC
  Administered 2020-05-05: 5 mg via INTRAMUSCULAR
  Filled 2020-05-05: qty 1

## 2020-05-05 MED ORDER — IOHEXOL 9 MG/ML PO SOLN
500.0000 mL | ORAL | Status: AC
  Administered 2020-05-05: 500 mL via ORAL

## 2020-05-05 MED ORDER — BISACODYL 10 MG RE SUPP
10.0000 mg | Freq: Once | RECTAL | Status: AC
  Administered 2020-05-05: 10 mg via RECTAL
  Filled 2020-05-05: qty 1

## 2020-05-05 MED ORDER — NAPHAZOLINE-GLYCERIN 0.012-0.2 % OP SOLN
1.0000 [drp] | Freq: Four times a day (QID) | OPHTHALMIC | Status: DC | PRN
  Administered 2020-05-05: 2 [drp] via OPHTHALMIC
  Administered 2020-05-06: 1 [drp] via OPHTHALMIC
  Filled 2020-05-05: qty 15

## 2020-05-05 NOTE — Progress Notes (Signed)
PT Cancellation Note  Patient Details Name: [name] MRN: [id number] DOB: May 18, 1941   Cancelled Treatment:    Reason Eval/Treat Not Completed: (P) Medical issues which prohibited therapy Pt is confused and combative when attempted AAROM. PT will try to follow back earlier in the day for following sessions.   Shirle Provencal B. Migdalia Dk PT, DPT Acute Rehabilitation Services Pager [phone] Office [phone]    [place] 05/05/2020, 3:21 PM

## 2020-05-05 NOTE — Progress Notes (Signed)
Noted pt.has not voided overnight .Bladder scan done -.400 cc;Dr.Mikhail  was text paged & made aware .Waiting for her to call back.

## 2020-05-05 NOTE — Progress Notes (Signed)
Bladder scan by previous shift 0700 420ml, in and out cath 0745 with 400 ml return,   Bladder scan 1800 48ml, dependant edema noted to upper extremities.

## 2020-05-05 NOTE — Progress Notes (Signed)
   05/05/20 0757  Assess: MEWS Score  Temp 97.9 F (36.6 C)  BP (!) 96/42  ECG Heart Rate 70  Resp 12  Level of Consciousness Alert  SpO2 100 %  O2 Device Room Air  Assess: MEWS Score  MEWS Temp 0  MEWS Systolic 1  MEWS Pulse 0  MEWS RR 1  MEWS LOC 0  MEWS Score 2  MEWS Score Color Yellow  Assess: if the MEWS score is Yellow or Red  Were vital signs taken at a resting state? Yes  Focused Assessment Change from prior assessment (see assessment flowsheet)  Early Detection of Sepsis Score *See Row Information* Low  MEWS guidelines implemented *See Row Information* Yes  Treat  Pain Scale PAINAD  Breathing 0  Negative Vocalization 0  Facial Expression 0  Body Language 0  Consolability 0  PAINAD Score 0  Take Vital Signs  Increase Vital Sign Frequency  Yellow: Q 2hr X 2 then Q 4hr X 2, if remains yellow, continue Q 4hrs  Escalate  MEWS: Escalate Yellow: discuss with charge nurse/RN and consider discussing with provider and RRT  Notify: Charge Nurse/RN  Name of Charge Nurse/RN Notified Carroll Kinds  Date Charge Nurse/RN Notified 05/05/20  Time Charge Nurse/RN Notified 1024

## 2020-05-05 NOTE — Progress Notes (Addendum)
PROGRESS NOTE    [name]  XTK:[id number] DOB: 1940/10/17 DOA: 04/30/2020 PCP: [name], MD   Brief Narrative:  HPI On 04/30/2020 by Dr. Zada Finders [name] is a 79 y.o. 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  History is limited from patient due to dementia with acute delirium therefore is otherwise supplemented from daughter at bedside, EDP, and chart review.  Patient was recently hospitalized from 03/15/2020-03/27/2020 initially for sepsis due to unknown etiology. No obvious infectious source was found. Hospital course was complicated by acute respiratory failure with hypoxia due to aspiration pneumonia versus drug-induced pneumonitis from chemotherapy. He was eventually discharged to SNF.  Daughter states that patient was going to be transported from his SNF to an oncology appointment however on their arrival he appeared unresponsive.  Daughter was called and she says on his arrival he appeared more confused than usual.  She says that he has not been eating well over the last couple of weeks.  She says he complained of feeling generally ill but no specific complaints.  He was brought to the ED for further evaluation.  Interim history Patient male with acute kidney injury which is now improved.  Patient was to be discharged on 05/04/2020 however developed vomiting and noted to have hypoglycemia.  Currently placed on D5 bicarb drip along.  Abdominal x-ray obtained showing questionable ileus versus partial SBO.  CT abdomen pending. Assessment & Plan   Acute kidney injury -Suspect multifactorial including poor oral intake, hypotension, medications as well as urinary tract infection -Creatinine on admission 2.31, down to 0.84 (baseline creatinine of 0.7-0.9) -Renal  ultrasound negative for hydronephrosis, enlarged prostate -Continue IV fluids -Monitor BMP -Home medications of losartan, Lasix, acyclovir held  UTI -UA: many bacteria, >50WBC, moderate leukocytes -Urine culture >100K Ecoli, pansenisitive -Blood cultures 1/2  Positive for staph epidermidis (likely contaminant) -Currently afebrile with no leukocytosis -Placed on IV ceftriaxone and transitioned to keflex   Hypoglycemia -Suspect secondary to poor oral intake -She was placed on D5 with bicarb -Continue to monitor CBGs  Vomiting -patient had completed an Ensure and then vomited -Abdominal x-ray obtained showing diffuse gaseous distention of small bowel loops.  Favor adynamic ileus versus partial distal small bowel obstruction. -Will obtain CT abdomen pelvis -Patient has not had a bowel movement since 05/01/2020, will order Dulcolax suppository -Continue antiemetics and IV fluids -CT abdomen pelvis showed no acute findings, no bowel obstruction or bowel wall inflammation.  Bibasilar pleural effusions small to moderate in size.  IVC is decompressed suggesting hypovolemia.  Chronic diverticulosis without acute diverticulitis.  Malnutrition -Patient with poor oral intake -suspect secondary to dementia -Continue IV fluids and monitor -nutrition consulted  Hypotension -Secondary to medications in conjunction with poor oral intake -Patient was noted to be orthostatic -BP has improved  -Discontinued IVF and BP has remained stable -Of note, sepsis was not present and has been ruled out  Metabolic acidosis -likely secondary to the above -Improved on bicarb drip, continue to monitor BMP  Possible dementia with acute delirium/acute metabolic encephalopathy -Acute delirium in the setting of UTI -Discussed with daughter, she states that her father has been forgetful however does not know that he has been diagnosed with dementia -Per daughter, patient continues to be confused -CT head  unremarkable -MRI brain ordered -Continue to monitor  Atrial  fibrillation/complete heart block status post pacemaker placement -CHA2DS2-VASc score 5 -Continue Xarelto -Currently in paced rhythm  CAD -Patient unable to tell me if he is having any chest pain -Continue Xarelto, statin  Essential hypertension -Patient currently hypotensive at this time -Hold Lasix, losartan  Hyperlipidemia -Continue statin  Generalized weakness -PT and OT recommending SNF  Subacute nondisplaced left fourth rib fracture -Continue supportive care -Encourage incentive spirometry  OSA -Continue CPAP  BPH with urinary retention -Noted on renal ultrasound -Started finasteride as patient was starting to have mild urinary retention  DVT Prophylaxis Xarelto  Code Status: DNR  Family Communication: None at bedside. Daughter via phone  Disposition Plan:  Status is: Inpatient  Remains inpatient appropriate because: patient now with hypoglycemia and vomiting.    Dispo: The patient is from: SNF              Anticipated d/c is to: SNF              Anticipated d/c date is: 1-2 days              Patient currently is not medically stable to d/c.   Consultants None  Procedures  Renal US  Antibiotics   Anti-infectives (From admission, onward)   Start     Dose/Rate Route Frequency Ordered Stop   05/04/20 0000  cephALEXin (KEFLEX) 500 MG capsule        500 mg Oral Every 12 hours 05/04/20 1512 05-25-20 2359   05/03/20 2200  cephALEXin (KEFLEX) capsule 500 mg        500 mg Oral Every 12 hours 05/03/20 1045     05/01/20 1300  cefTRIAXone (ROCEPHIN) 1 g in sodium chloride 0.9 % 100 mL IVPB  Status:  Discontinued        1 g 200 mL/hr over 30 Minutes Intravenous Every 24 hours 04/30/20 1915 04/30/20 2000   05/01/20 0100  cefTRIAXone (ROCEPHIN) 1 g in sodium chloride 0.9 % 100 mL IVPB  Status:  Discontinued        1 g 200 mL/hr over 30 Minutes Intravenous Every 24 hours 04/30/20 2000 05/03/20 1045    04/30/20 1315  vancomycin (VANCOREADY) IVPB 2000 mg/400 mL        2,000 mg 200 mL/hr over 120 Minutes Intravenous  Once 04/30/20 1301 04/30/20 1703   04/30/20 1315  ceFEPIme (MAXIPIME) 2 g in sodium chloride 0.9 % 100 mL IVPB        2 g 200 mL/hr over 30 Minutes Intravenous  Once 04/30/20 1301 04/30/20 1359      Subjective:   [name] seen and examined today.  Patient with possible dementia, continues to be confused.  Very difficult to understand. Objective:   Vitals:   05/05/20 0757 05/05/20 0941 05/05/20 1000 05/05/20 1151  BP: (!) 96/42 (!) 94/46 (!) 94/46 (!) 95/57  Pulse:  72 72 70  Resp: _0 Temp: 97.9 F (36.6 C) 98.1 F (36.7 C) 98.1 F (36.7 C) (!) 97.4 F (36.3 C)  TempSrc: Oral  Oral Oral  SpO2: 100%     Weight:      Height:        Intake/Output Summary (Last 24 hours) at 05/05/2020 1312 Last data filed at 05/05/2020 1136 Gross per 24 hour  Intake 477.44 ml  Output 400 ml  Net 77.44 ml   Filed Weights   05/03/20 0258 05/04/20 0247 05/05/20 0500  Weight: 100.7 kg 100.2 kg 100.2 kg    Exam  General: Well developed, edlerly, chronically ill appearing, NAD  HEENT: NCAT,  mucous membranes moist.   Cardiovascular: S1 S2 auscultated, RRR  Respiratory: diminished breath sounds, no wheezing  Abdomen: Soft, nontender, nondistended, + bowel sounds  Extremities: warm dry without cyanosis clubbing or edema  Neuro: AAOx1 (self only), able to follow directions, speech somewhat difficult to understand   Data Reviewed: I have personally reviewed following labs and imaging studies  CBC: Recent Labs  Lab 04/30/20 1657 05/01/20 0449 05/03/20 0422  WBC 6.9 5.4 5.2  HGB 10.3* 11.0* 11.5*  HCT 32.8* 34.8* 34.5*  MCV 103.5* 102.4* 99.4  PLT 204 233 371   Basic Metabolic Panel: Recent Labs  Lab 04/30/20 1257 05/01/20 0449 05/02/20 0220 05/03/20 0422 05/04/20 0642  NA 136 135 141 141 142  K 3.6 4.7 4.3 3.9 4.0  CL 101 107 111 111 110   CO2 19* 17* 15* 15* 12*  GLUCOSE 104* 79 72 65* 56*  BUN _0 CREATININE 2.31* 1.65* 1.06 0.84 0.93  CALCIUM 9.2 8.4* 8.9 8.8* 9.2  MG 2.0  --   --   --   --    GFR: Estimated Creatinine Clearance: 76.4 mL/min (by C-G formula based on SCr of 0.93 mg/dL). Liver Function Tests: Recent Labs  Lab 04/30/20 1257  AST 21  ALT 9  ALKPHOS 62  BILITOT 1.6*  PROT 6.1*  ALBUMIN 3.0*   No results for input(s): LIPASE, AMYLASE in the last 168 hours. No results for input(s): AMMONIA in the last 168 hours. Coagulation Profile: Recent Labs  Lab 04/30/20 1257  INR 2.6*   Cardiac Enzymes: No results for input(s): CKTOTAL, CKMB, CKMBINDEX, TROPONINI in the last 168 hours. BNP (last 3 results) No results for input(s): PROBNP in the last 8760 hours. HbA1C: No results for input(s): HGBA1C in the last 72 hours. CBG: Recent Labs  Lab 05/04/20 1625 05/04/20 1656 05/04/20 1843 05/05/20 0821 05/05/20 1226  GLUCAP 57* 49* 115* 105* 123*   Lipid Profile: No results for input(s): CHOL, HDL, LDLCALC, TRIG, CHOLHDL, LDLDIRECT in the last 72 hours. Thyroid Function Tests: No results for input(s): TSH, T4TOTAL, FREET4, T3FREE, THYROIDAB in the last 72 hours. Anemia Panel: No results for input(s): VITAMINB12, FOLATE, FERRITIN, TIBC, IRON, RETICCTPCT in the last 72 hours. Urine analysis:    Component Value Date/Time   COLORURINE YELLOW 04/30/2020 1705   APPEARANCEUR CLOUDY (A) 04/30/2020 1705   LABSPEC 1.010 04/30/2020 1705   PHURINE 5.0 04/30/2020 1705   GLUCOSEU NEGATIVE 04/30/2020 1705   HGBUR MODERATE (A) 04/30/2020 1705   BILIRUBINUR NEGATIVE 04/30/2020 1705   KETONESUR 5 (A) 04/30/2020 1705   PROTEINUR 100 (A) 04/30/2020 1705   UROBILINOGEN 0.2 04/28/2013 1949   NITRITE NEGATIVE 04/30/2020 1705   LEUKOCYTESUR MODERATE (A) 04/30/2020 1705   Sepsis Labs: _1 (procalcitonin:4,lacticidven:4)  ) Recent Results (from the past 240 hour(s))  Culture, blood (Routine x  2)     Status: None   Collection Time: 04/30/20 12:45 PM   Specimen: BLOOD  Result Value Ref Range Status   Specimen Description BLOOD LEFT ANTECUBITAL  Final   Special Requests   Final    BOTTLES DRAWN AEROBIC AND ANAEROBIC Blood Culture adequate volume   Culture   Final    NO GROWTH 5 DAYS Performed at [facility], [place] [address], [place], [place] [address]    Report Status 05/05/2020 FINAL  Final  Urine culture     Status: Abnormal   Collection  Time: 04/30/20 12:58 PM   Specimen: Urine, Random  Result Value Ref Range Status   Specimen Description URINE, RANDOM  Final   Special Requests   Final    NONE Performed at [facility], [address], [place], [place] [address]    Culture >=100,000 COLONIES/mL ESCHERICHIA COLI (A)  Final   Report Status 05/02/2020 FINAL  Final   Organism ID, Bacteria ESCHERICHIA COLI (A)  Final      Susceptibility   Escherichia coli - MIC*    AMPICILLIN 4 SENSITIVE Sensitive     CEFAZOLIN <=4 SENSITIVE Sensitive     CEFTRIAXONE <=0.25 SENSITIVE Sensitive     CIPROFLOXACIN <=0.25 SENSITIVE Sensitive     GENTAMICIN <=1 SENSITIVE Sensitive     IMIPENEM <=0.25 SENSITIVE Sensitive     NITROFURANTOIN <=16 SENSITIVE Sensitive     TRIMETH/SULFA <=20 SENSITIVE Sensitive     AMPICILLIN/SULBACTAM <=2 SENSITIVE Sensitive     PIP/TAZO <=4 SENSITIVE Sensitive     * >=100,000 COLONIES/mL ESCHERICHIA COLI  Culture, blood (Routine x 2)     Status: Abnormal   Collection Time: 04/30/20  1:00 PM   Specimen: BLOOD  Result Value Ref Range Status   Specimen Description BLOOD RIGHT ANTECUBITAL  Final   Special Requests   Final    BOTTLES DRAWN AEROBIC AND ANAEROBIC Blood Culture adequate volume   Culture  Setup Time   Final    GRAM POSITIVE COCCI IN CLUSTERS AEROBIC BOTTLE ONLY Organism ID to follow CRITICAL RESULT CALLED TO, READ BACK BY AND VERIFIED WITH: Erin Hearing PHARMD _0  05/01/20 EB    Culture (A)  Final    STAPHYLOCOCCUS  EPIDERMIDIS THE SIGNIFICANCE OF ISOLATING THIS ORGANISM FROM A SINGLE SET OF BLOOD CULTURES WHEN MULTIPLE SETS ARE DRAWN IS UNCERTAIN. PLEASE NOTIFY THE MICROBIOLOGY DEPARTMENT WITHIN ONE WEEK IF SPECIATION AND SENSITIVITIES ARE REQUIRED. Performed at [facility], [place] [address] [place], [place] [address]    Report Status 05/03/2020 FINAL  Final  Blood Culture ID Panel (Reflexed)     Status: Abnormal   Collection Time: 04/30/20  1:00 PM  Result Value Ref Range Status   Enterococcus faecalis NOT DETECTED NOT DETECTED Final   Enterococcus Faecium NOT DETECTED NOT DETECTED Final   Listeria monocytogenes NOT DETECTED NOT DETECTED Final   Staphylococcus species DETECTED (A) NOT DETECTED Final    Comment: RESULT CALLED TO, READ BACK BY AND VERIFIED WITH: Erin Hearing PHARMD _1  05/01/20 EB    Staphylococcus aureus (BCID) NOT DETECTED NOT DETECTED Final   Staphylococcus epidermidis DETECTED (A) NOT DETECTED Final    Comment: Methicillin (oxacillin) resistant coagulase negative staphylococcus. Possible blood culture contaminant (unless isolated from more than one blood culture draw or clinical case suggests pathogenicity). No antibiotic treatment is indicated for blood  culture contaminants. RESULT CALLED TO, READ BACK BY AND VERIFIED WITH: Erin Hearing PHARMD _2  05/01/20 EB    Staphylococcus lugdunensis NOT DETECTED NOT DETECTED Final   Streptococcus species NOT DETECTED NOT DETECTED Final   Streptococcus agalactiae NOT DETECTED NOT DETECTED Final   Streptococcus pneumoniae NOT DETECTED NOT DETECTED Final   Streptococcus pyogenes NOT DETECTED NOT DETECTED Final   A.calcoaceticus-baumannii NOT DETECTED NOT DETECTED Final   Bacteroides fragilis NOT DETECTED NOT DETECTED Final   Enterobacterales NOT DETECTED NOT DETECTED Final   Enterobacter cloacae complex NOT DETECTED NOT DETECTED Final   Escherichia coli NOT DETECTED NOT DETECTED Final   Klebsiella aerogenes NOT DETECTED NOT  DETECTED Final   Klebsiella  oxytoca NOT DETECTED NOT DETECTED Final   Klebsiella pneumoniae NOT DETECTED NOT DETECTED Final   Proteus species NOT DETECTED NOT DETECTED Final   Salmonella species NOT DETECTED NOT DETECTED Final   Serratia marcescens NOT DETECTED NOT DETECTED Final   Haemophilus influenzae NOT DETECTED NOT DETECTED Final   Neisseria meningitidis NOT DETECTED NOT DETECTED Final   Pseudomonas aeruginosa NOT DETECTED NOT DETECTED Final   Stenotrophomonas maltophilia NOT DETECTED NOT DETECTED Final   Candida albicans NOT DETECTED NOT DETECTED Final   Candida auris NOT DETECTED NOT DETECTED Final   Candida glabrata NOT DETECTED NOT DETECTED Final   Candida krusei NOT DETECTED NOT DETECTED Final   Candida parapsilosis NOT DETECTED NOT DETECTED Final   Candida tropicalis NOT DETECTED NOT DETECTED Final   Cryptococcus neoformans/gattii NOT DETECTED NOT DETECTED Final   Methicillin resistance mecA/C DETECTED (A) NOT DETECTED Final    Comment: RESULT CALLED TO, READ BACK BY AND VERIFIED WITH: Erin Hearing PHARMD _0  05/01/20 EB Performed at [facility], [address] [place], [place] [address]   SARS Coronavirus 2 by RT PCR (hospital order, performed in [facility] hospital lab) Nasopharyngeal Nasopharyngeal Swab     Status: None   Collection Time: 04/30/20  1:06 PM   Specimen: Nasopharyngeal Swab  Result Value Ref Range Status   SARS Coronavirus 2 NEGATIVE NEGATIVE Final    Comment: (NOTE) SARS-CoV-2 target nucleic acids are NOT DETECTED.  The SARS-CoV-2 RNA is generally detectable in upper and lower respiratory specimens during the acute phase of infection. The lowest concentration of SARS-CoV-2 viral copies this assay can detect is 250 copies / mL. A negative result does not preclude SARS-CoV-2 infection and should not be used as the sole basis for treatment or other patient management decisions.  A negative result may occur with improper specimen collection /  handling, submission of specimen other than nasopharyngeal swab, presence of viral mutation(s) within the areas targeted by this assay, and inadequate number of viral copies (<250 copies / mL). A negative result must be combined with clinical observations, patient history, and epidemiological information.  Fact Sheet for Patients:   StrictlyIdeas.no  Fact Sheet for Healthcare Providers: BankingDealers.co.za  This test is not yet approved or  cleared by the [place] FDA and has been authorized for detection and/or diagnosis of SARS-CoV-2 by FDA under an Emergency Use Authorization (EUA).  This EUA will remain in effect (meaning this test can be used) for the duration of the COVID-19 declaration under Section 564(b)(1) of the Act, 21 U.S.C. section 360bbb-3(b)(1), unless the authorization is terminated or revoked sooner.  Performed at [name], [place] [address] [place], [place] [address]   MRSA PCR Screening     Status: Abnormal   Collection Time: 04/30/20  9:00 PM   Specimen: Nasal Mucosa; Nasopharyngeal  Result Value Ref Range Status   MRSA by PCR POSITIVE (A) NEGATIVE Final    Comment:        The GeneXpert MRSA Assay (FDA approved for NASAL specimens only), is one component of a comprehensive MRSA colonization surveillance program. It is not intended to diagnose MRSA infection nor to guide or monitor treatment for MRSA infections. RESULT CALLED TO, READ BACK BY AND VERIFIED WITH: L LOW RN 05/01/20  0019 JDW Performed at [facility], [place] [address] [place], [place] [address]   SARS Coronavirus 2 by RT PCR (hospital order, performed in [facility] hospital lab) Nasopharyngeal Nasopharyngeal Swab  Status: None   Collection Time: 05/04/20 12:55 PM   Specimen: Nasopharyngeal Swab  Result Value Ref Range Status   SARS Coronavirus 2 NEGATIVE NEGATIVE Final    Comment: (NOTE) SARS-CoV-2 target nucleic acids  are NOT DETECTED.  The SARS-CoV-2 RNA is generally detectable in upper and lower respiratory specimens during the acute phase of infection. The lowest concentration of SARS-CoV-2 viral copies this assay can detect is 250 copies / mL. A negative result does not preclude SARS-CoV-2 infection and should not be used as the sole basis for treatment or other patient management decisions.  A negative result may occur with improper specimen collection / handling, submission of specimen other than nasopharyngeal swab, presence of viral mutation(s) within the areas targeted by this assay, and inadequate number of viral copies (<250 copies / mL). A negative result must be combined with clinical observations, patient history, and epidemiological information.  Fact Sheet for Patients:   StrictlyIdeas.no  Fact Sheet for Healthcare Providers: BankingDealers.co.za  This test is not yet approved or  cleared by the [place] FDA and has been authorized for detection and/or diagnosis of SARS-CoV-2 by FDA under an Emergency Use Authorization (EUA).  This EUA will remain in effect (meaning this test can be used) for the duration of the COVID-19 declaration under Section 564(b)(1) of the Act, 21 U.S.C. section 360bbb-3(b)(1), unless the authorization is terminated or revoked sooner.  Performed at [facility], [place] [address], [place], [place] [address]       Radiology Studies: DG Abd Portable 1V  Result Date: 05/04/2020 CLINICAL DATA:  Vomiting. EXAM: PORTABLE ABDOMEN - 1 VIEW COMPARISON:  CT 7 4 21.  CT 03/15/2020 FINDINGS: Diffuse gaseous distension of small bowel loops. No gross free intraperitoneal air. Incompletely imaged pacer. IMPRESSION: Diffuse gaseous distension of small bowel loops. Favor adynamic ileus. Low-grade partial distal small bowel obstruction could look similar. Electronically Signed   By: Abigail Miyamoto M.D.   On: 05/04/2020  17:44     Scheduled Meds: . atorvastatin  10 mg Oral q1800  . cephALEXin  500 mg Oral Q12H  . finasteride  5 mg Oral Daily  . melatonin  3 mg Oral QHS  . Rivaroxaban  20 mg Oral Q supper  . sodium chloride flush  3 mL Intravenous Q12H   Continuous Infusions: .  sodium bicarbonate  infusion 1000 mL 75 mL/hr at 05/05/20 1005     LOS: 4 days   Time Spent in minutes   30 minutes  Keaundre Thelin D.O. on 05/05/2020 at 1:12 PM  Between 7am to 7pm - Please see pager noted on [email or web address]  After 7pm go to [email or web address]  And look for the night coverage person covering for me after hours  [facility] Office  [phone]

## 2020-05-05 NOTE — Progress Notes (Signed)
In and out cath completed at 0800. Pt voided 400 mL of clear amber urine.

## 2020-05-06 ENCOUNTER — Telehealth: Payer: Self-pay | Admitting: Medical Oncology

## 2020-05-06 ENCOUNTER — Inpatient Hospital Stay (HOSPITAL_COMMUNITY): Payer: Medicare HMO

## 2020-05-06 DIAGNOSIS — Z515 Encounter for palliative care: Secondary | ICD-10-CM

## 2020-05-06 DIAGNOSIS — R627 Adult failure to thrive: Secondary | ICD-10-CM

## 2020-05-06 DIAGNOSIS — Z7189 Other specified counseling: Secondary | ICD-10-CM

## 2020-05-06 DIAGNOSIS — C9000 Multiple myeloma not having achieved remission: Secondary | ICD-10-CM

## 2020-05-06 LAB — COMPREHENSIVE METABOLIC PANEL
ALT: 9 U/L (ref 0–44)
AST: 25 U/L (ref 15–41)
Albumin: 2.1 g/dL — ABNORMAL LOW (ref 3.5–5.0)
Alkaline Phosphatase: 44 U/L (ref 38–126)
Anion gap: 10 (ref 5–15)
BUN: 14 mg/dL (ref 8–23)
CO2: 27 mmol/L (ref 22–32)
Calcium: 8.2 mg/dL — ABNORMAL LOW (ref 8.9–10.3)
Chloride: 100 mmol/L (ref 98–111)
Creatinine, Ser: 1.64 mg/dL — ABNORMAL HIGH (ref 0.61–1.24)
GFR calc Af Amer: 45 mL/min — ABNORMAL LOW (ref >60)
GFR calc non Af Amer: 39 mL/min — ABNORMAL LOW (ref >60)
Glucose, Bld: 136 mg/dL — ABNORMAL HIGH (ref 70–99)
Potassium: 3 mmol/L — ABNORMAL LOW (ref 3.5–5.1)
Sodium: 137 mmol/L (ref 135–145)
Total Bilirubin: 1.2 mg/dL (ref 0.3–1.2)
Total Protein: 4.2 g/dL — ABNORMAL LOW (ref 6.5–8.1)

## 2020-05-06 LAB — GLUCOSE, CAPILLARY
Glucose-Capillary: 123 mg/dL — ABNORMAL HIGH (ref 70–99)
Glucose-Capillary: 121 mg/dL — ABNORMAL HIGH (ref 70–99)
Glucose-Capillary: 130 mg/dL — ABNORMAL HIGH (ref 70–99)
Glucose-Capillary: 128 mg/dL — ABNORMAL HIGH (ref 70–99)
Glucose-Capillary: 139 mg/dL — ABNORMAL HIGH (ref 70–99)
Glucose-Capillary: 131 mg/dL — ABNORMAL HIGH (ref 70–99)

## 2020-05-06 LAB — CBC
HCT: 30.2 % — ABNORMAL LOW (ref 39.0–52.0)
Hemoglobin: 10.1 g/dL — ABNORMAL LOW (ref 13.0–17.0)
MCH: 33 pg (ref 26.0–34.0)
MCHC: 33.4 g/dL (ref 30.0–36.0)
MCV: 98.7 fL (ref 80.0–100.0)
Platelets: 196 10*3/uL (ref 150–400)
RBC: 3.06 MIL/uL — ABNORMAL LOW (ref 4.22–5.81)
RDW: 13.8 % (ref 11.5–15.5)
WBC: 5.9 10*3/uL (ref 4.0–10.5)
nRBC: 0 % (ref 0.0–0.2)

## 2020-05-06 LAB — KAPPA/LAMBDA LIGHT CHAINS
Kappa free light chain: 16.6 mg/L (ref 3.3–19.4)
Kappa, lambda light chain ratio: 0.66 (ref 0.26–1.65)
Lambda free light chains: 25 mg/L (ref 5.7–26.3)

## 2020-05-06 LAB — BETA 2 MICROGLOBULIN, SERUM: Beta-2 Microglobulin: 2.4 mg/L (ref 0.6–2.4)

## 2020-05-06 MED ORDER — GLYCOPYRROLATE 0.2 MG/ML IJ SOLN: 0.2000 mg | INTRAMUSCULAR | Status: DC | PRN

## 2020-05-06 MED ORDER — POTASSIUM CHLORIDE CRYS ER 20 MEQ PO TBCR: 40.0000 meq | EXTENDED_RELEASE_TABLET | Freq: Two times a day (BID) | ORAL | Status: DC

## 2020-05-06 MED ORDER — POTASSIUM CHLORIDE CRYS ER 20 MEQ PO TBCR
40.0000 meq | EXTENDED_RELEASE_TABLET | Freq: Once | ORAL | Status: AC
  Administered 2020-05-06: 40 meq via ORAL
  Filled 2020-05-06: qty 2

## 2020-05-06 MED ORDER — HALOPERIDOL 0.5 MG PO TABS
0.5000 mg | ORAL_TABLET | ORAL | Status: DC | PRN
  Filled 2020-05-06: qty 1

## 2020-05-06 MED ORDER — HALOPERIDOL LACTATE 2 MG/ML PO CONC
0.5000 mg | ORAL | Status: DC | PRN
  Filled 2020-05-06: qty 0.3

## 2020-05-06 MED ORDER — GLYCOPYRROLATE 1 MG PO TABS
1.0000 mg | ORAL_TABLET | ORAL | Status: DC | PRN
  Filled 2020-05-06: qty 1

## 2020-05-06 MED ORDER — HALOPERIDOL LACTATE 5 MG/ML IJ SOLN: 0.5000 mg | INTRAMUSCULAR | Status: DC | PRN

## 2020-05-06 MED ORDER — POLYVINYL ALCOHOL 1.4 % OP SOLN
1.0000 [drp] | Freq: Four times a day (QID) | OPHTHALMIC | Status: DC | PRN
  Filled 2020-05-06: qty 15

## 2020-05-06 MED ORDER — BIOTENE DRY MOUTH MT LIQD: 15.0000 mL | OROMUCOSAL | Status: DC | PRN

## 2020-05-06 MED ORDER — OXYCODONE HCL 5 MG/5ML PO SOLN
2.5000 mg | ORAL | Status: DC | PRN
  Filled 2020-05-06: qty 5

## 2020-05-06 MED ORDER — DEXTROSE 5 % IV SOLN: INTRAVENOUS | Status: AC

## 2020-05-06 MED ORDER — ALBUMIN HUMAN 5 % IV SOLN
25.0000 g | Freq: Once | INTRAVENOUS | Status: AC
  Administered 2020-05-06: 25 g via INTRAVENOUS
  Filled 2020-05-06: qty 500

## 2020-05-06 MED ORDER — LACTATED RINGERS IV BOLUS
500.0000 mL | Freq: Once | INTRAVENOUS | Status: AC
  Administered 2020-05-06: 500 mL via INTRAVENOUS

## 2020-05-06 MED ORDER — SODIUM CHLORIDE 0.9 % IV BOLUS
500.0000 mL | Freq: Once | INTRAVENOUS | Status: AC
  Administered 2020-05-06: 500 mL via INTRAVENOUS

## 2020-05-06 NOTE — Progress Notes (Signed)
Patient hypotensive throughout day shift and night shift so far. MAP typically >60. Patient BP 79/41 automatically with a MAP of 53. BP 78/40 automatically. Dr. Myna Hidalgo paged, placed orders for 579mL NS bolus, and evaluated pt at bedside. MD would like to keep MAP >60  Repeat BP and MAP after bolus 80/45, 56. Paged, received orders for albumin. Will continue to monitor.   Elesa Hacker, RN

## 2020-05-06 NOTE — TOC Progression Note (Addendum)
Transition of Care [facility]) - Progression Note    Patient Details  Name: [name] MRN: [id number] Date of Birth: 28-Jul-1941  Transition of Care [facility] CM/SW [place], [place] Phone Number: 05/06/2020, 4:25 PM  Clinical Narrative:     CSW received request referral for patient to go to hospice facility in [place]. CSW called patients daughter [name] to confirm. Patients daughter confirmed that she wanted CSW to make referral to Hospice facility in [place]. CSW called Ebony Hail with [facility] and she will reach out to family . Ebony Hail from [facility] will follow up with CSW.  CSW will continue to follow.  Expected Discharge Plan: [place] Barriers to Discharge: No Barriers Identified  Expected Discharge Plan and Services Expected Discharge Plan: [place] In-house Referral: Clinical Social Work   Post Acute Care Choice: [place] Living arrangements for the past 2 months: [place] Expected Discharge Date: 05/04/20                                     Social Determinants of Health (SDOH) Interventions    Readmission Risk Interventions Readmission Risk Prevention Plan 05/01/2020  Transportation Screening Complete  Skilled Nursing Facility Complete  Some recent data might be hidden

## 2020-05-06 NOTE — Progress Notes (Signed)
Occupational Therapy Treatment Patient Details Name: [name] MRN: [id number] DOB: 28-May-1941 Today's Date: 05/06/2020    History of present illness 79 y.o. 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  Patient was recently hospitalized from 03/15/2020-03/27/2020 initially for sepsis due to unknown etiology. No obvious infectious source was found. Hospital course was complicated by acute respiratory failure with hypoxia discharge to [facility]. Daughter provided hx in ED and states that patient was going to be transported from his SNF to an oncology appointment however on their arrival he appeared unresponsive. Brought to ED with BP 62/31 and found to have AKI. Admitted 04/30/20 for observation and treatment.    OT comments  Pt received in bed, awake with confusion. Still requires heavy multimodal cueing to increase and encourage engagement for session. Pt is easily distracted and door shut to decrease access visual/ verbal stim outside of room. Total A bed mobility from supine to sit, then Min Guard to min A for trunk support due to posterior lean observed. Therapist provided pt with jello for self feeding in sitting, HOH A and simple 1 step cueing required for patient to follow through with sequence to present spoon to mouth. Max A to return back to supine. DC and freq remains the same. OT will continue to follow.   Follow Up Recommendations  SNF;Supervision/Assistance - 24 hour    Equipment Recommendations  None recommended by OT    Recommendations for Other Services      Precautions / Restrictions Precautions Precautions: Fall Precaution Comments: hx of fall and dementia Restrictions Weight Bearing Restrictions: No       Mobility Bed Mobility Overal bed mobility: Needs  Assistance Bed Mobility: Supine to Sit;Sit to Supine     Supine to sit: HOB elevated;Total assist Sit to supine: Max assist;+2 for physical assistance   General bed mobility comments: pt with increased confusion about task of getting to EoB and requires total A, as pt requiring more assist with balancing EoB, able to understand getting back to bed and requires maxA x 2 for management of trunkt to bed and LE back into bed, total Ax2 to scoot to HoB  Transfers Overall transfer level: Needs assistance               General transfer comment: did not attempt due to low BP    Balance Overall balance assessment: Needs assistance Sitting-balance support: Feet supported;Single extremity supported;Bilateral upper extremity supported Sitting balance-Leahy Scale: Zero Sitting balance - Comments: initially requires total A for maintaining balance EoB, sat EoB for approx 12 minutes and was able to progresss to min guard for short bouts requiring max A inbetween. Therapist assisted pt to present B hand on bed rail for UE support as well as assistance to plant B feet on the floor. Postural control: Posterior lean                                 ADL either performed or assessed with clinical judgement   ADL Overall ADL's : Needs assistance/impaired Eating/Feeding: Maximal assistance Eating/Feeding Details (indicate cue type and reason): pt sat EOB with Max A +2 for self feeding, pt given heavy cueing and HOH A to initiate present spoon to mouth. Pt unable to scoop jello. Once  HOH A facilitated with to wipe mouth with napkin pt able to follow command with simple 1 step.                                   General ADL Comments: Session mostly address sitting tolerance and self feeding EOB.     Vision       Perception     Praxis      Cognition Arousal/Alertness: Awake/alert Behavior During Therapy: Restless Overall Cognitive Status: Impaired/Different from  baseline Area of Impairment: Orientation;Attention;Memory;Following commands;Safety/judgement;Awareness;Problem solving                 Orientation Level: Disoriented to;Place;Time;Situation Current Attention Level: Selective Memory: Decreased short-term memory Following Commands: Follows multi-step commands inconsistently;Follows one step commands inconsistently Safety/Judgement: Decreased awareness of deficits Awareness: Emergent Problem Solving: Slow processing;Requires verbal cues;Requires tactile cues;Decreased initiation General Comments: pt continues to be confused, with maximal multimodal cuing able to get to EoB, closure of door to prevent distractions during session.        Exercises     Shoulder Instructions       General Comments BP 106/69, HR 38-70 bpm, SaO2 on RA >94%O2 throughout session     Pertinent Vitals/ Pain       Pain Assessment: Faces Faces Pain Scale: Hurts little more Pain Location: generalized stiffness in ankles and knees Pain Descriptors / Indicators: Grimacing;Guarding Pain Intervention(s): Limited activity within patient's tolerance;Monitored during session;Repositioned  Home Living                                          Prior Functioning/Environment              Frequency  Min 2X/week        Progress Toward Goals  OT Goals(current goals can now be found in the care plan section)  Progress towards OT goals: Progressing toward goals  Acute Rehab OT Goals Patient Stated Goal: none stated OT Goal Formulation: Patient unable to participate in goal setting Time For Goal Achievement: 05/15/20 Potential to Achieve Goals: Good ADL Goals Pt Will Perform Grooming: with min assist;standing Pt Will Perform Lower Body Bathing: with min assist;sit to/from stand;sitting/lateral leans Pt Will Perform Upper Body Dressing: with min assist;sitting Pt Will Transfer to Toilet: with mod assist;stand pivot transfer;bedside  commode Pt/caregiver will Perform Home Exercise Program: Increased strength;Both right and left upper extremity;With theraband Additional ADL Goal #1: Pt will increase to modA for bed mobility as precursor for ADL functional mobility.  Plan Discharge plan remains appropriate    Co-evaluation    PT/OT/SLP Co-Evaluation/Treatment: Yes Reason for Co-Treatment: For patient/therapist safety PT goals addressed during session: Mobility/safety with mobility OT goals addressed during session: ADL's and self-care      AM-PAC OT "6 Clicks" Daily Activity     Outcome Measure   Help from another person eating meals?: A Little Help from another person taking care of personal grooming?: A Little Help from another person toileting, which includes using toliet, bedpan, or urinal?: A Lot Help from another person bathing (including washing, rinsing, drying)?: A Lot Help from another person to put on and taking off regular upper body clothing?: A Lot Help from another person to put on and taking off regular lower body clothing?: A Lot 6 Click Score: 14    End  of Session    OT Visit Diagnosis: Unsteadiness on feet (R26.81);Muscle weakness (generalized) (M62.81);Other symptoms and signs involving cognitive function   Activity Tolerance Patient tolerated treatment well   Patient Left in bed;with restraints reapplied;with call bell/phone within reach;with bed alarm set (hand mit R hand)   Nurse Communication Mobility status        Time: 6088-8358 OT Time Calculation (min): 24 min  Charges: OT General Charges $OT Visit: 1 Visit OT Treatments $Self Care/Home Management : 8-22 mins  Minus Breeding, MSOT, OTR/L  Supplemental Rehabilitation Services  [phone]    Marius Ditch 05/06/2020, 3:54 PM

## 2020-05-06 NOTE — Progress Notes (Signed)
Palliative:  Full note to follow. I have spoken with both son and daughter who have witnessed ongoing decline over the past weeks. They understand that he is at end of life and they do not want him to suffer and opt for comfort and transition to hospice facility for end of life care. They request referral to [facility] facility.   Vinie Sill, NP Palliative Medicine Team Pager [phone] (Please see [email or web address] for schedule) Team Phone [phone]

## 2020-05-06 NOTE — Progress Notes (Signed)
[facility]  Received referral from [employer]. I have reached out to discuss transition from hospital setting to [facility] and the comfort care approach. There was no answer I will continue to follow up tomorrow.   Webb Silversmith RN [phone]

## 2020-05-06 NOTE — Telephone Encounter (Signed)
I told [name] that Dr Julien Nordmann will review myeloma labs .

## 2020-05-06 NOTE — Progress Notes (Signed)
PROGRESS NOTE  [name] BPZ:[id number] DOB: October 06, 1940 DOA: 04/30/2020 PCP: [name], MD   LOS: 5 days   Brief narrative: As per HPI,  HPI On 04/30/2020 by Dr. Angelique Holm Fieldsis a 79 y.o.malewith medical history significant forparoxysmal A. fib/flutter on Xarelto, CHB s/p PPM, CAD, multiple myeloma, hypertension, hyperlipidemia, BPH, OSA,very hard of hearing,and dementia who presented to the ED for evaluation of acute encephalopathy.History was limited due to dementia. Patient was recently hospitalized from 03/15/2020-03/27/2020 initially for sepsis due to unknown etiology. No obvious infectious source was found. Hospital course was complicated by acute respiratory failure with hypoxia due to aspiration pneumonia versus drug-induced pneumonitis from chemotherapy. He was eventually discharged to SNF.  Patient was able to go to oncology department when he was found to be unresponsive and confused. Patient was then brought to the ED for further evaluation.  Interim history Patient did had some acute kidney injury with improved and subsequently started deteriorating..   Patient was to be discharged on 05/04/2020 however developed vomiting and noted to have hypoglycemia.    Subsequently he was put on D5 drip.    Abdominal x-ray obtained showing questionable ileus versus partial SBO.  CT abdomen however negative for acute findings.  Assessment/Plan:  Principal Problem:   AKI (acute kidney injury) ([facility]) Active Problems:   Multiple myeloma ([facility])   Obstructive sleep apnea   Essential hypertension   Paroxysmal atrial fibrillation ([facility])   Acute lower UTI   Hyperlipidemia   Delirium with dementia  Acute kidney injury Likely multifactorial from poor oral intake, hypertension, medication induced.  Creatinine on admission 2.31, down to 1.6 today. Renal ultrasound negative for hydronephrosis, enlarged prostate.  Continue with IV fluids.  Monitor BMP.  Continue to hold  losartan Lasix and acyclovir.  Could be secondary to hypotension yesterday.  E Coli UTI Afebrile.  No leukocytosis.  Was initially on IV Rocephin.  Currently on oral Keflex.  Hypoglycemia On D5 water with bicarb.  Will closely monitor.  POC glucose of 130.  Likely secondary to poor oral intake.  Will change to D5 water.  Hypokalemia.  Likely worsened due to  bicarb drip.  Plan is replace potassium twice daily.  Discontinue bicarbonate drip  Vomiting Improved.  CT scan without bowel obstruction or ileus.  Continue Dulcolax suppositories.    Malnutrition Poor oral intake likely secondary to dementia.  Will consult dietary.  Hypotension Sepsis ruled out.  On bicarb drip with D5.  Received albumin x1 today.  Will give Ringer lactate 500 mL x 1 today.  Patient still on the low side.  Discontinue bicarb drip.  Initiate D5 water  Metabolic acidosis Improved on bicarb drip.  Possible dementia with acute delirium/acute metabolic encephalopathy -No diagnosis of definite dementia.  Patient does have some cognitive decline at home.  CT was unremarkable.  Follow MRI of the brain.   Atrial fibrillation/complete heart block status post pacemaker placement -CHA2DS2-VASc score 5. Continue Xarelto. Currently in paced rhythm  CAD Continue Xarelto, statin  Essential hypertension currently hypotensive at this time.  IV fluid bolus.  Continue to hold Lasix, losartan  Hyperlipidemia -Continue statin  Generalized weakness -PT and OT has recommended SNF  Subacute nondisplaced left fourth rib fracture -Encourage incentive spirometry.  Conservative treatment  OSA  -Continue CPAP as tolerated  BPH with urinary retention Was on finasteride which has been resumed.  Ultrasound 04/30/2020 showed enlarged prostate but no hydronephrosis.  History of multiple myeloma.  Patient follows up with oncology as  outpatient.  Ethics.  Overall prognosis of the patient is poor.  Patient has multiple  underlying issues including multiple myeloma ,failure to thrive and is under going hypoglycemia, acute kidney injury and hypotension.  Palliative care on board regarding goals of care.  DVT prophylaxis:  rivaroxaban (XARELTO) tablet 20 mg    Code Status: DNR  Family Communication: None.  Palliative care on board in touch with the family  Status is: Inpatient  Remains inpatient appropriate because:Unsafe d/c plan, IV treatments appropriate due to intensity of illness or inability to take PO, Inpatient level of care appropriate due to severity of illness and hypotension, aki   Dispo: The patient is from: SNF              Anticipated d/c is to: SNF/hospice.              Anticipated d/c date is: 2 days              Patient currently is not medically stable to d/c.  Consultants:  Palliative care  Procedures:  Renal ultrasound  Antibiotics:  . Cephalexin  Anti-infectives (From admission, onward)   Start     Dose/Rate Route Frequency Ordered Stop   05/04/20 0000  cephALEXin (KEFLEX) 500 MG capsule        500 mg Oral Every 12 hours 05/04/20 1512 2020-06-05 2359   05/03/20 2200  cephALEXin (KEFLEX) capsule 500 mg        500 mg Oral Every 12 hours 05/03/20 1045     05/01/20 1300  cefTRIAXone (ROCEPHIN) 1 g in sodium chloride 0.9 % 100 mL IVPB  Status:  Discontinued        1 g 200 mL/hr over 30 Minutes Intravenous Every 24 hours 04/30/20 1915 04/30/20 2000   05/01/20 0100  cefTRIAXone (ROCEPHIN) 1 g in sodium chloride 0.9 % 100 mL IVPB  Status:  Discontinued        1 g 200 mL/hr over 30 Minutes Intravenous Every 24 hours 04/30/20 2000 05/03/20 1045   04/30/20 1315  vancomycin (VANCOREADY) IVPB 2000 mg/400 mL        2,000 mg 200 mL/hr over 120 Minutes Intravenous  Once 04/30/20 1301 04/30/20 1703   04/30/20 1315  ceFEPIme (MAXIPIME) 2 g in sodium chloride 0.9 % 100 mL IVPB        2 g 200 mL/hr over 30 Minutes Intravenous  Once 04/30/20 1301 04/30/20 1359     Subjective: Today,  patient was seen and examined at bedside.  Hard of hearing.  Difficult to comprehend speech..  Denies any pain, nausea or vomiting.  Nursing staff reported hypotension  Objective: Vitals:   05/06/20 1200 05/06/20 1322  BP: (!) 89/51 (!) 97/49  Pulse: (!) 52 60  Resp: 20 16  Temp: (!) 97 F (36.1 C) (!) 97.3 F (36.3 C)  SpO2: 93% 92%    Intake/Output Summary (Last 24 hours) at 05/06/2020 1344 Last data filed at 05/06/2020 1327 Gross per 24 hour  Intake 2450.59 ml  Output --  Net 2450.59 ml   Filed Weights   05/04/20 0247 05/05/20 0500 05/06/20 0449  Weight: 100.2 kg 100.2 kg 101.6 kg   Body mass index is 31.24 kg/m.   Physical Exam: GENERAL: Patient is chronically ill, elderly, mildly communicative.  Oriented to self,, not in obvious distress.  Alert and awake, with HENT: No scleral pallor or icterus. Pupils equally reactive to light. Oral mucosa is moist.  Purulent discharge of the right eye: NECK:  is supple, no gross swelling noted. CHEST:    Diminished breath sounds bilaterally. CVS: S1 and S2 heard, no murmur. Regular rate and rhythm.  ABDOMEN: Soft, non-tender, bowel sounds are present.External urinary catheter in place. EXTREMITIES: No edema.  Right upper extremity on mittens.  Moving extremities. CNS: Slurred speech, oriented to self, follows few directions, mildly communicative, moves all extremities SKIN: warm and dry without rashes.  Data Review: I have personally reviewed the following laboratory data and studies,  CBC: Recent Labs  Lab 04/30/20 1657 05/01/20 0449 05/03/20 0422 05/06/20 0828  WBC 6.9 5.4 5.2 5.9  HGB 10.3* 11.0* 11.5* 10.1*  HCT 32.8* 34.8* 34.5* 30.2*  MCV 103.5* 102.4* 99.4 98.7  PLT 204 233 241 440   Basic Metabolic Panel: Recent Labs  Lab 04/30/20 1257 04/30/20 1257 05/01/20 0449 05/02/20 0220 05/03/20 0422 05/04/20 0642 05/06/20 0828  NA 136   < > 135 141 141 142 137  K 3.6   < > 4.7 4.3 3.9 4.0 3.0*  CL 101   < > 107 111  111 110 100  CO2 19*   < > 17* 15* 15* 12* 27  GLUCOSE 104*   < > 79 72 65* 56* 136*  BUN 18   < > 18 16 11 8 14   CREATININE 2.31*   < > 1.65* 1.06 0.84 0.93 1.64*  CALCIUM 9.2   < > 8.4* 8.9 8.8* 9.2 8.2*  MG 2.0  --   --   --   --   --   --    < > = values in this interval not displayed.   Liver Function Tests: Recent Labs  Lab 04/30/20 1257 05/06/20 0828  AST 21 25  ALT 9 9  ALKPHOS 62 44  BILITOT 1.6* 1.2  PROT 6.1* 4.2*  ALBUMIN 3.0* 2.1*   No results for input(s): LIPASE, AMYLASE in the last 168 hours. No results for input(s): AMMONIA in the last 168 hours. Cardiac Enzymes: No results for input(s): CKTOTAL, CKMB, CKMBINDEX, TROPONINI in the last 168 hours. BNP (last 3 results) Recent Labs    03/15/20 1138 03/22/20 1600  BNP 132.6* 215.1*    ProBNP (last 3 results) No results for input(s): PROBNP in the last 8760 hours.  CBG: Recent Labs  Lab 05/05/20 2123 05/05/20 2330 05/06/20 0437 05/06/20 0746 05/06/20 1210  GLUCAP 154* 135* 123* 121* 130*   Recent Results (from the past 240 hour(s))  Culture, blood (Routine x 2)     Status: None   Collection Time: 04/30/20 12:45 PM   Specimen: BLOOD  Result Value Ref Range Status   Specimen Description BLOOD LEFT ANTECUBITAL  Final   Special Requests   Final    BOTTLES DRAWN AEROBIC AND ANAEROBIC Blood Culture adequate volume   Culture   Final    NO GROWTH 5 DAYS Performed at [facility], [address] [place], [place] [address]    Report Status 05/05/2020 FINAL  Final  Urine culture     Status: Abnormal   Collection Time: 04/30/20 12:58 PM   Specimen: Urine, Random  Result Value Ref Range Status   Specimen Description URINE, RANDOM  Final   Special Requests   Final    NONE Performed at [facility], [place] [address], [place], [place] [address]    Culture >=100,000 COLONIES/mL ESCHERICHIA COLI (A)  Final   Report Status 05/02/2020 FINAL  Final   Organism ID, Bacteria ESCHERICHIA COLI (A)   Final  Susceptibility   Escherichia coli - MIC*    AMPICILLIN 4 SENSITIVE Sensitive     CEFAZOLIN <=4 SENSITIVE Sensitive     CEFTRIAXONE <=0.25 SENSITIVE Sensitive     CIPROFLOXACIN <=0.25 SENSITIVE Sensitive     GENTAMICIN <=1 SENSITIVE Sensitive     IMIPENEM <=0.25 SENSITIVE Sensitive     NITROFURANTOIN <=16 SENSITIVE Sensitive     TRIMETH/SULFA <=20 SENSITIVE Sensitive     AMPICILLIN/SULBACTAM <=2 SENSITIVE Sensitive     PIP/TAZO <=4 SENSITIVE Sensitive     * >=100,000 COLONIES/mL ESCHERICHIA COLI  Culture, blood (Routine x 2)     Status: Abnormal   Collection Time: 04/30/20  1:00 PM   Specimen: BLOOD  Result Value Ref Range Status   Specimen Description BLOOD RIGHT ANTECUBITAL  Final   Special Requests   Final    BOTTLES DRAWN AEROBIC AND ANAEROBIC Blood Culture adequate volume   Culture  Setup Time   Final    GRAM POSITIVE COCCI IN CLUSTERS AEROBIC BOTTLE ONLY Organism ID to follow CRITICAL RESULT CALLED TO, READ BACK BY AND VERIFIED WITH: Erin Hearing PHARMD @2132  05/01/20 EB    Culture (A)  Final    STAPHYLOCOCCUS EPIDERMIDIS THE SIGNIFICANCE OF ISOLATING THIS ORGANISM FROM A SINGLE SET OF BLOOD CULTURES WHEN MULTIPLE SETS ARE DRAWN IS UNCERTAIN. PLEASE NOTIFY THE MICROBIOLOGY DEPARTMENT WITHIN ONE WEEK IF SPECIATION AND SENSITIVITIES ARE REQUIRED. Performed at [facility], [place] [address] [place], [place] [address]    Report Status 05/03/2020 FINAL  Final  Blood Culture ID Panel (Reflexed)     Status: Abnormal   Collection Time: 04/30/20  1:00 PM  Result Value Ref Range Status   Enterococcus faecalis NOT DETECTED NOT DETECTED Final   Enterococcus Faecium NOT DETECTED NOT DETECTED Final   Listeria monocytogenes NOT DETECTED NOT DETECTED Final   Staphylococcus species DETECTED (A) NOT DETECTED Final    Comment: RESULT CALLED TO, READ BACK BY AND VERIFIED WITH: Erin Hearing PHARMD @2132  05/01/20 EB    Staphylococcus aureus (BCID) NOT DETECTED NOT DETECTED  Final   Staphylococcus epidermidis DETECTED (A) NOT DETECTED Final    Comment: Methicillin (oxacillin) resistant coagulase negative staphylococcus. Possible blood culture contaminant (unless isolated from more than one blood culture draw or clinical case suggests pathogenicity). No antibiotic treatment is indicated for blood  culture contaminants. RESULT CALLED TO, READ BACK BY AND VERIFIED WITH: Erin Hearing PHARMD @2132  05/01/20 EB    Staphylococcus lugdunensis NOT DETECTED NOT DETECTED Final   Streptococcus species NOT DETECTED NOT DETECTED Final   Streptococcus agalactiae NOT DETECTED NOT DETECTED Final   Streptococcus pneumoniae NOT DETECTED NOT DETECTED Final   Streptococcus pyogenes NOT DETECTED NOT DETECTED Final   A.calcoaceticus-baumannii NOT DETECTED NOT DETECTED Final   Bacteroides fragilis NOT DETECTED NOT DETECTED Final   Enterobacterales NOT DETECTED NOT DETECTED Final   Enterobacter cloacae complex NOT DETECTED NOT DETECTED Final   Escherichia coli NOT DETECTED NOT DETECTED Final   Klebsiella aerogenes NOT DETECTED NOT DETECTED Final   Klebsiella oxytoca NOT DETECTED NOT DETECTED Final   Klebsiella pneumoniae NOT DETECTED NOT DETECTED Final   Proteus species NOT DETECTED NOT DETECTED Final   Salmonella species NOT DETECTED NOT DETECTED Final   Serratia marcescens NOT DETECTED NOT DETECTED Final   Haemophilus influenzae NOT DETECTED NOT DETECTED Final   Neisseria meningitidis NOT DETECTED NOT DETECTED Final   Pseudomonas aeruginosa NOT DETECTED NOT DETECTED Final   Stenotrophomonas maltophilia NOT DETECTED NOT DETECTED Final   Candida albicans  NOT DETECTED NOT DETECTED Final   Candida auris NOT DETECTED NOT DETECTED Final   Candida glabrata NOT DETECTED NOT DETECTED Final   Candida krusei NOT DETECTED NOT DETECTED Final   Candida parapsilosis NOT DETECTED NOT DETECTED Final   Candida tropicalis NOT DETECTED NOT DETECTED Final   Cryptococcus neoformans/gattii NOT  DETECTED NOT DETECTED Final   Methicillin resistance mecA/C DETECTED (A) NOT DETECTED Final    Comment: RESULT CALLED TO, READ BACK BY AND VERIFIED WITH: Erin Hearing PHARMD @2132  05/01/20 EB Performed at [facility] [address], [place], [place] [address]   SARS Coronavirus 2 by RT PCR (hospital order, performed in [facility] hospital lab) Nasopharyngeal Nasopharyngeal Swab     Status: None   Collection Time: 04/30/20  1:06 PM   Specimen: Nasopharyngeal Swab  Result Value Ref Range Status   SARS Coronavirus 2 NEGATIVE NEGATIVE Final    Comment: (NOTE) SARS-CoV-2 target nucleic acids are NOT DETECTED.  The SARS-CoV-2 RNA is generally detectable in upper and lower respiratory specimens during the acute phase of infection. The lowest concentration of SARS-CoV-2 viral copies this assay can detect is 250 copies / mL. A negative result does not preclude SARS-CoV-2 infection and should not be used as the sole basis for treatment or other patient management decisions.  A negative result may occur with improper specimen collection / handling, submission of specimen other than nasopharyngeal swab, presence of viral mutation(s) within the areas targeted by this assay, and inadequate number of viral copies (<250 copies / mL). A negative result must be combined with clinical observations, patient history, and epidemiological information.  Fact Sheet for Patients:   StrictlyIdeas.no  Fact Sheet for Healthcare Providers: BankingDealers.co.za  This test is not yet approved or  cleared by the [place] FDA and has been authorized for detection and/or diagnosis of SARS-CoV-2 by FDA under an Emergency Use Authorization (EUA).  This EUA will remain in effect (meaning this test can be used) for the duration of the COVID-19 declaration under Section 564(b)(1) of the Act, 21 U.S.C. section 360bbb-3(b)(1), unless the authorization is  terminated or revoked sooner.  Performed at [facility], [place] [address] [place], [place] [address]   MRSA PCR Screening     Status: Abnormal   Collection Time: 04/30/20  9:00 PM   Specimen: Nasal Mucosa; Nasopharyngeal  Result Value Ref Range Status   MRSA by PCR POSITIVE (A) NEGATIVE Final    Comment:        The GeneXpert MRSA Assay (FDA approved for NASAL specimens only), is one component of a comprehensive MRSA colonization surveillance program. It is not intended to diagnose MRSA infection nor to guide or monitor treatment for MRSA infections. RESULT CALLED TO, READ BACK BY AND VERIFIED WITH: L LOW RN 05/01/20  0019 JDW Performed at [facility], [place] [address] [place], [place] [address]   SARS Coronavirus 2 by RT PCR (hospital order, performed in [facility] hospital lab) Nasopharyngeal Nasopharyngeal Swab     Status: None   Collection Time: 05/04/20 12:55 PM   Specimen: Nasopharyngeal Swab  Result Value Ref Range Status   SARS Coronavirus 2 NEGATIVE NEGATIVE Final    Comment: (NOTE) SARS-CoV-2 target nucleic acids are NOT DETECTED.  The SARS-CoV-2 RNA is generally detectable in upper and lower respiratory specimens during the acute phase of infection. The lowest concentration of SARS-CoV-2 viral copies this assay can detect is 250 copies / mL. A negative result does not  preclude SARS-CoV-2 infection and should not be used as the sole basis for treatment or other patient management decisions.  A negative result may occur with improper specimen collection / handling, submission of specimen other than nasopharyngeal swab, presence of viral mutation(s) within the areas targeted by this assay, and inadequate number of viral copies (<250 copies / mL). A negative result must be combined with clinical observations, patient history, and epidemiological information.  Fact Sheet for Patients:   StrictlyIdeas.no  Fact Sheet for  Healthcare Providers: BankingDealers.co.za  This test is not yet approved or  cleared by the [place] FDA and has been authorized for detection and/or diagnosis of SARS-CoV-2 by FDA under an Emergency Use Authorization (EUA).  This EUA will remain in effect (meaning this test can be used) for the duration of the COVID-19 declaration under Section 564(b)(1) of the Act, 21 U.S.C. section 360bbb-3(b)(1), unless the authorization is terminated or revoked sooner.  Performed at [facility], [place] [address] [place], [place] [address]      Studies: CT ABDOMEN PELVIS WO CONTRAST  Result Date: 05/05/2020 CLINICAL DATA:  Abdominal distension.  Bowel obstruction suspected. EXAM: CT ABDOMEN AND PELVIS WITHOUT CONTRAST TECHNIQUE: Multidetector CT imaging of the abdomen and pelvis was performed following the standard protocol without IV contrast. COMPARISON:  CT abdomen dated 03/15/2020. FINDINGS: Lower chest: Bibasilar pleural effusions, small to moderate in size, with associated compressive atelectasis. Hepatobiliary: Gallbladder is distended but otherwise unremarkable. No focal liver abnormality is seen. No bile duct dilatation is seen. Pancreas: Pancreas is partially infiltrated with fat but otherwise unremarkable. Spleen: Normal in size without focal abnormality. Adrenals/Urinary Tract: Adrenal glands appear normal. Kidneys are unremarkable without mass, stone or hydronephrosis. No ureteral or bladder calculi. Bladder walls appear thickened but this is likely accentuated by the inadequate bladder distention. Stomach/Bowel: No dilated large or small bowel loops. Diverticulosis of the descending and sigmoid colon but no focal inflammatory change seen to suggest acute diverticulitis. No dilated large or small bowel loops. Stomach is unremarkable, decompressed. Appendix is normal. Vascular/Lymphatic: Aortic atherosclerosis. IVC is decompressed suggesting hypovolemia.  Reproductive: Prostate is unremarkable. Other: No free fluid or abscess collection. No free intraperitoneal air. Musculoskeletal: No acute or suspicious osseous finding. Degenerative spondylosis of the lumbar spine, moderate in degree. IMPRESSION: 1. No acute findings within the abdomen or pelvis. No bowel obstruction or evidence of bowel wall inflammation. No evidence of acute solid organ abnormality. No renal or ureteral calculi. Appendix is normal. 2. Bibasilar pleural effusions, small to moderate in size, with associated compressive atelectasis. 3. IVC is decompressed suggesting hypovolemia. 4. Colonic diverticulosis without evidence of acute diverticulitis. Aortic Atherosclerosis (ICD10-I70.0). Electronically Signed   By: Franki Cabot M.D.   On: 05/05/2020 14:30   CT HEAD WO CONTRAST  Result Date: 05/05/2020 CLINICAL DATA:  Delirium, altered mental status. EXAM: CT HEAD WITHOUT CONTRAST TECHNIQUE: Contiguous axial images were obtained from the base of the skull through the vertex without intravenous contrast. COMPARISON:  CT head 03/19/2020 FINDINGS: Brain: No acute hemorrhage. No evidence of acute large vascular territory infarct. Similar hypodensity in the inferior right basal ganglia, most likely a dilated perivascular space. Similar patchy white matter hypoattenuation, nonspecific but most likely the sequela of chronic microvascular ischemic disease. Left basal ganglia calcification, unchanged. Mild diffuse cerebral volume loss with ex vacuo ventricular dilation. No hydrocephalus. Vascular: Calcific intracranial atherosclerosis without evidence of a hyperdense vessel. Skull: Posterior scalp contusion without underlying fracture. Sinuses/Orbits: Mild mucosal thickening of the inferior maxillary  sinuses, scattered ethmoid air cells, and the right greater than left frontal sinuses. Other: No mastoid effusion. IMPRESSION: 1. No acute intracranial abnormality. MRI is more sensitive for the detection of  acute infarct. 2. Posterior scalp contusion without underlying fracture. 3. Similar chronic microvascular ischemic disease. Electronically Signed   By: Margaretha Sheffield MD   On: 05/05/2020 14:28   DG Abd Portable 1V  Result Date: 05/04/2020 CLINICAL DATA:  Vomiting. EXAM: PORTABLE ABDOMEN - 1 VIEW COMPARISON:  CT 7 4 21.  CT 03/15/2020 FINDINGS: Diffuse gaseous distension of small bowel loops. No gross free intraperitoneal air. Incompletely imaged pacer. IMPRESSION: Diffuse gaseous distension of small bowel loops. Favor adynamic ileus. Low-grade partial distal small bowel obstruction could look similar. Electronically Signed   By: Abigail Miyamoto M.D.   On: 05/04/2020 17:44      Flora Lipps, MD  [facility] 05/06/2020

## 2020-05-06 NOTE — Consult Note (Addendum)
Consultation Note Date: 05/06/2020   Patient Name: [name]  DOB: [birth year]  MRN: [id number]  Age / Sex: 79 y.o., male  PCP: [name], MD Referring Physician: Flora Lipps, MD  Reason for Consultation: Establishing goals of care  HPI/Patient Profile: 79 y.o. male  with past medical history of paroxysmal atrial fib/flutter on Xarelto, complete heart block s/p pacemaker, CAD, multiple myeloma, hypertension, hyperlipidemia, BPH, OSA, very hard of hearing, dementia, recent hospitalization for aspiration pneumonia vs drug-induced pneumonitis from chemotherapy admitted on 04/30/2020 with acute encephalopathy and worsening confusion with underlying dementia complicated by delirium. Noted to have poor oral intake with acute kidney injury and UTI.   Clinical Assessment and Goals of Care: I met today with [name] but he is hard of hearing but very confused. Unable to have conversation as he does not answer any of my questions appropriately. He does deny pain/discomfort.   I called and spoke with son, [name]. We discuss his father's continued decline in SNF since his last hospitalization and palliative consultation. [name] shares that his father discharged to rehab and plateaued and was only at "~30%" and [name] expresses that his father appears to be at ~10% now and without any improvement. He recognizes that his father is approaching end of life and we discuss options. [name] shares that he recognizes his father has very poor quality of life and he does not want him to suffer anymore. I suggested consideration of comfort care and transition to hospice facility where he can be surrounded by his loved ones. [name] will discuss with his sister and they will get back to me.   I received return call from daughter, [name]. We discussed the same above. She has discussed with her brother [name] and they would like to  pursue comfort and hospice facility. They would both like for him to go to hospice facility in [place] where his family is located.   All questions/concerns addressed. Emotional support provided. Updated Dr. Louanne Belton and [facility] team.   Primary Decision Maker HCPOA adult children [name] and [name]    SUMMARY OF RECOMMENDATIONS   - Transition to comfort care  - Hopeful for transition to hospice facility in [place]:  DNR   Symptom Management:   Orders changed to reflect transition to comfort care. Will maintain some IVF for now to give time for family to gather and visit.   Palliative Prophylaxis:   Delirium Protocol, Frequent Pain Assessment, Oral Care and Turn Reposition  Additional Recommendations (Limitations, Scope, Preferences):  Full Comfort Care  Psycho-social/Spiritual:   Desire for further Chaplaincy support:yes  Additional Recommendations: Grief/Bereavement Support  Prognosis:   < 2 weeks  Discharge Planning: Hospice facility      Primary Diagnoses: Present on Admission: . AKI (acute kidney injury) ([place]) . Multiple myeloma ([place]) . Essential hypertension . Obstructive sleep apnea . Paroxysmal atrial fibrillation (HCC)   I have reviewed the medical record, interviewed the patient and family, and examined the patient. The following aspects are pertinent.  Past Medical History:  Diagnosis Date  . Atrial flutter ([place])    A. s/p prior ablation;  B.  s/p CTI ablation 10/24/10 (Dr. Caryl Comes)  . CAD (coronary artery disease)    A.  s/p CFX in the past;   B.  cath 06/2010: LAD 30-40%, D1 50%, OM1 50%, AVCFX stent 30%, dCFX 70-80% (med Rx), mRCA 40%;      C.  Echo 06/2010: EF 60-65%, mod LVH; mild LAE     . Complete heart block (HCC)    a. s/p STJ pacemaker  . Cubital tunnel syndrome   . Degenerative disc disease   . Heart block   . HOH (hard of hearing)   . Hx of adenomatous colonic polyps   . Hyperlipidemia   . Hypertension    . Multiple myeloma   . Persistent atrial fibrillation ([place])   . Sleep apnea   . Venous insufficiency    Social History   Socioeconomic History  . Marital status: Married    Spouse name: Not on file  . Number of children: Not on file  . Years of education: Not on file  . Highest education level: Not on file  Occupational History  . Occupation: Retired from Retail banker: RETIRED  . Occupation: still farms part time  Tobacco Use  . Smoking status: Former Smoker    Packs/day: 1.00    Years: 49.00    Pack years: 49.00    Types: Cigarettes    Quit date: 09/12/2004    Years since quitting: 15.6  . Smokeless tobacco: Former Systems developer  . Tobacco comment: started at age 72; smoked 1 ppd; quit in 2006  Vaping Use  . Vaping Use: Never used  Substance and Sexual Activity  . Alcohol use: Yes    Alcohol/week: 28.0 standard drinks    Types: 28 Standard drinks or equivalent per week    Comment: occasional  . Drug use: No  . Sexual activity: Not Currently  Other Topics Concern  . Not on file  Social History Narrative  . Not on file   Social Determinants of Health   Financial Resource Strain:   . Difficulty of Paying Living Expenses: Not on file  Food Insecurity:   . Worried About Charity fundraiser in the Last Year: Not on file  . Ran Out of Food in the Last Year: Not on file  Transportation Needs:   . Lack of Transportation (Medical): Not on file  . Lack of Transportation (Non-Medical): Not on file  Physical Activity:   . Days of Exercise per Week: Not on file  . Minutes of Exercise per Session: Not on file  Stress:   . Feeling of Stress : Not on file  Social Connections:   . Frequency of Communication with Friends and Family: Not on file  . Frequency of Social Gatherings with Friends and Family: Not on file  . Attends Religious Services: Not on file  . Active Member of Clubs or Organizations: Not on file  . Attends Archivist Meetings: Not on  file  . Marital Status: Not on file   Family History  Problem Relation Age of Onset  . Heart disease Father   . Cancer Father        prostate  . Heart disease Brother   . Colon cancer Neg Hx    Scheduled Meds: . atorvastatin  10 mg Oral q1800  . cephALEXin  500 mg Oral Q12H  . finasteride  5 mg Oral Daily  . melatonin  3 mg Oral QHS  . Rivaroxaban  20 mg Oral Q supper  . sodium chloride flush  3 mL Intravenous Q12H   Continuous Infusions: .  sodium bicarbonate  infusion 1000 mL 75 mL/hr at 05/06/20 0439   PRN Meds:.acetaminophen **OR** acetaminophen, naphazoline-glycerin, ondansetron (ZOFRAN) IV Allergies  Allergen Reactions  . Bee Venom Anaphylaxis        Review of Systems  Unable to perform ROS: Dementia    Physical Exam Vitals and nursing note reviewed.  Constitutional:      Appearance: He is ill-appearing.  Cardiovascular:     Rate and Rhythm: Normal rate.  Pulmonary:     Effort: Pulmonary effort is normal. No tachypnea, accessory muscle usage or respiratory distress.  Abdominal:     General: Abdomen is flat.     Palpations: Abdomen is soft.  Neurological:     Mental Status: He is alert. He is disoriented and confused.     Vital Signs: BP (!) 87/35 (BP Location: Right Leg)   Pulse 75   Temp 98.5 F (36.9 C) (Oral)   Resp 18   Ht _0  (1.803 m)   Wt 101.6 kg   SpO2 92%   BMI 31.24 kg/m  Pain Scale: PAINAD   Pain Score: 0-No pain   SpO2: SpO2: 92 % O2 Device:SpO2: 92 % O2 Flow Rate: .   IO: Intake/output summary:   Intake/Output Summary (Last 24 hours) at 05/06/2020 0847 Last data filed at 05/06/2020 0300 Gross per 24 hour  Intake 2503.59 ml  Output --  Net 2503.59 ml    LBM: Last BM Date: 05/06/20 Baseline Weight: Weight: 105 kg Most recent weight: Weight: 101.6 kg     Palliative Assessment/Data:     Time In: 1330 Time Out: 1430 Time Total: 60 min Greater than 50%  of this time was spent counseling and coordinating care  related to the above assessment and plan.  Signed by: Vinie Sill, NP Palliative Medicine Team Pager # [phone] (M-F 8a-5p) Team Phone # [phone] (Nights/Weekends)

## 2020-05-06 NOTE — Progress Notes (Signed)
Physical Therapy Treatment Patient Details Name: [name] MRN: [id number] DOB: 03/07/1941 Today's Date: 05/06/2020    History of Present Illness 79 y.o. 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  Patient was recently hospitalized from 03/15/2020-03/27/2020 initially for sepsis due to unknown etiology. No obvious infectious source was found. Hospital course was complicated by acute respiratory failure with hypoxia discharge to [facility]. Daughter provided hx in ED and states that patient was going to be transported from his SNF to an oncology appointment however on their arrival he appeared unresponsive. Brought to ED with BP 62/31 and found to have AKI. Admitted 04/30/20 for observation and treatment.     PT Comments    Pt continues to be confused and requires maximal multimodal cuing and encouragement to get to EoB. Pt is limited in safe mobility by confusion, painful joints and decreased HR in presence of decreased strength and ROM. Pt currently requires total A to come to EoB. Once there able to progress from total A for balance to min guard for short bouts ~20 sec. Pt able to understand cues for returning to bed and requires maxAx2 for returning to bed. D/c plans remain appropriate. PT will continue to follow acutely.   Follow Up Recommendations  SNF     Equipment Recommendations  None recommended by PT       Precautions / Restrictions Precautions Precautions: Fall Precaution Comments: hx of fall and dementia Restrictions Weight Bearing Restrictions: No    Mobility  Bed Mobility Overal bed mobility: Needs Assistance Bed Mobility: Supine to Sit;Sit to Supine     Supine to sit: HOB elevated;Total assist Sit to supine: Max assist;+2 for physical assistance   General  bed mobility comments: pt with increased confusion about task of getting to EoB and requires total A, as pt requiring more assist with balancing EoB, able to understand getting back to bed and requires maxA x 2 for management of trunkt to bed and LE back into bed, total Ax2 to scoot to HoB  Transfers                 General transfer comment: did not attempt due to low BP      Balance Overall balance assessment: Needs assistance Sitting-balance support: Feet supported;Single extremity supported;Bilateral upper extremity supported Sitting balance-Leahy Scale: Zero Sitting balance - Comments: initially requires total A for maintaining balance EoB, sat EoB for approx 12 minutes and was able to progresss to min guard for short bouts requiring max A inbetween Postural control: Posterior lean                                  Cognition Arousal/Alertness: Awake/alert Behavior During Therapy: Restless Overall Cognitive Status: Impaired/Different from baseline Area of Impairment: Orientation;Attention;Memory;Following commands;Safety/judgement;Awareness;Problem solving                 Orientation Level: Disoriented to;Place;Time;Situation Current Attention Level: Selective Memory: Decreased short-term memory Following Commands: Follows multi-step commands inconsistently;Follows one step commands inconsistently Safety/Judgement: Decreased awareness of deficits Awareness: Emergent Problem Solving: Slow processing;Requires verbal cues;Requires tactile cues;Decreased initiation General Comments: pt continues to be confused, with maximal multimodal cuing able to get to [place] comments (skin  integrity, edema, etc.): BP 106/69, HR 38-70 bpm, SaO2 on RA >94%O2 throughout session       Pertinent Vitals/Pain Pain Assessment: Faces Faces Pain Scale: Hurts little more Pain Location: generalized stiffness in ankles and knees Pain Descriptors  / Indicators: Grimacing;Guarding Pain Intervention(s): Limited activity within patient's tolerance;Monitored during session;Repositioned           PT Goals (current goals can now be found in the care plan section) Acute Rehab PT Goals Patient Stated Goal: none stated PT Goal Formulation: Patient unable to participate in goal setting Time For Goal Achievement: 05/15/20 Potential to Achieve Goals: Fair Progress towards PT goals: Progressing toward goals    Frequency    Min 2X/week      PT Plan Current plan remains appropriate    Co-evaluation PT/OT/SLP Co-Evaluation/Treatment: Yes Reason for Co-Treatment: For patient/therapist safety PT goals addressed during session: Mobility/safety with mobility OT goals addressed during session: ADL's and self-care      AM-PAC PT "6 Clicks" Mobility   Outcome Measure  Help needed turning from your back to your side while in a flat bed without using bedrails?: A Little Help needed moving from lying on your back to sitting on the side of a flat bed without using bedrails?: Total Help needed moving to and from a bed to a chair (including a wheelchair)?: Total Help needed standing up from a chair using your arms (e.g., wheelchair or bedside chair)?: Total Help needed to walk in hospital room?: Total Help needed climbing 3-5 steps with a railing? : A Lot 6 Click Score: 9    End of Session   Activity Tolerance: Patient tolerated treatment well Patient left: in bed;with call bell/phone within reach;with bed alarm set;with restraints reapplied (one mitten on R hand) Nurse Communication: Mobility status;Other (comment) (orthostatic BP) PT Visit Diagnosis: Unsteadiness on feet (R26.81);Muscle weakness (generalized) (M62.81);Difficulty in walking, not elsewhere classified (R26.2);History of falling (Z91.81) Pain - Right/Left: Left (L>R) Pain - part of body: Knee;Ankle and joints of foot     Time: 9983-3825 PT Time Calculation (min) (ACUTE  ONLY): 23 min  Charges:  $Therapeutic Exercise: 8-22 mins                      B. Migdalia Dk PT, DPT Acute Rehabilitation Services Pager [phone] Office [phone]    [place] 05/06/2020, 3:30 PM

## 2020-05-06 NOTE — Progress Notes (Signed)
Per order  Changed device settings for MRI to  VOO at 85 bpm  Will call [employer] rep to interrogate and return to pre scan device settings after completion of exam.

## 2020-05-06 NOTE — Progress Notes (Signed)
PT Cancellation Note  Patient Details Name: [name] MRN: [id number] DOB: 09-18-1940   Cancelled Treatment:    Reason Eval/Treat Not Completed: (P) Medical issues which prohibited therapy Pt back from MRI, Pacer reset and having difficulty with heart monitor. Pt also in and out of responsiveness to verbal and tactile cues. PT will follow back tomorrow to determine if pt more appropriate for therapy.   Sorah Falkenstein B. Migdalia Dk PT, DPT Acute Rehabilitation Services Pager [phone] Office [phone]     [place] 05/06/2020, 12:56 PM

## 2020-05-06 NOTE — Progress Notes (Signed)
Pt came back from MRI, HR  28-70 BPM, paged attending and EP

## 2020-05-07 DIAGNOSIS — G9341 Metabolic encephalopathy: Secondary | ICD-10-CM

## 2020-05-07 LAB — GLUCOSE, CAPILLARY: Glucose-Capillary: 125 mg/dL — ABNORMAL HIGH (ref 70–99)

## 2020-05-07 LAB — IGG, IGA, IGM
IgA: 193 mg/dL (ref 61–437)
IgG (Immunoglobin G), Serum: 426 mg/dL — ABNORMAL LOW (ref 603–1613)
IgM (Immunoglobulin M), Srm: 375 mg/dL — ABNORMAL HIGH (ref 15–143)

## 2020-05-07 NOTE — TOC Progression Note (Signed)
Transition of Care [facility]) - Progression Note    Patient Details  Name: [name] MRN: [id number] Date of Birth: 1940/10/23  Transition of Care [facility]) CM/SW [place], [place] Phone Number: 05/07/2020, 11:10 AM  Clinical Narrative:     CSW received call from [place] with [facility]. Patients daughter has decided that she wants patient to go to [facility] and they have a bed available today. CSW informed MD.  CSW will continue to follow.  Expected Discharge Plan: [place] Barriers to Discharge: No Barriers Identified  Expected Discharge Plan and Services Expected Discharge Plan: [place] In-house Referral: Clinical Social Work   Post Acute Care Choice: [place] Living arrangements for the past 2 months: [place] Expected Discharge Date: 05/07/20                                     Social Determinants of Health (SDOH) Interventions    Readmission Risk Interventions Readmission Risk Prevention Plan 05/01/2020  Transportation Screening Complete  Skilled Nursing Facility Complete  Some recent data might be hidden

## 2020-05-07 NOTE — Discharge Summary (Signed)
Physician Discharge Summary  [name] NOM:[id number] DOB: July 14, 1941 DOA: 04/30/2020  PCP: [name], MD  Admit date: 04/30/2020 Discharge date: 05/07/2020  Admitted From: Home  Discharge disposition: Residential hospice   Recommendations for Outpatient Follow-Up:   As per hospice provider  Discharge Diagnosis:   Principal Problem:   AKI (acute kidney injury) ([place]) Active Problems:   Multiple myeloma ([place])   Obstructive sleep apnea   Essential hypertension   Paroxysmal atrial fibrillation ([place])   Acute lower UTI   Hyperlipidemia   Delirium with dementia    Discharge Condition: stable  Diet recommendation:  Regular.  Code status: DNR   History of Present Illness:   [name] a 79 y.o.malewith medical history significant forparoxysmal A. fib/flutter on Xarelto, CHB s/p PPM, CAD, multiple myeloma, hypertension, hyperlipidemia, BPH, OSA,very hard of hearing,and dementia who presented to the ED for evaluation of acute encephalopathy.History was limited due to dementia. Patient was recently hospitalized from 03/15/2020-03/27/2020 initially for sepsis due to unknown etiology. No obvious infectious source was found. Hospital course was complicated by acute respiratory failure with hypoxia due to aspiration pneumonia versus drug-induced pneumonitis from chemotherapy. He was eventually discharged to SNF.  Patient was able to go to oncology department when he was found to be unresponsive and confused. Patient was then brought to the ED for further evaluation. Patient did had some acute kidney injury with improved and subsequently started deteriorating. He developed vomiting and noted to have hypoglycemia.   Subsequently he was put on D5 drip.  Abdominal x-ray obtained showing questionable ileus versus partial SBO. CT abdomen however negative for acute findings.   Hospital Course:   Following conditions were addressed during hospitalization as listed  below,  Acute kidney injury Likely multifactorial from poor oral intake, hypotension, medication induced.  Creatinine on admission 2.31. Renal ultrasound negative for hydronephrosis, enlarged prostate.  Received IV fluids.    E Coli UTI Afebrile.  No leukocytosis.  Was initially on IV Rocephin.  Currently on oral Keflex.  Hypoglycemia Received D5 water.   Likely secondary to poor oral intake.    Hypokalemia.  Replenished Po,  ogff bicarbonate drip  Vomiting Improved.  CT scan without bowel obstruction or ileus.  Continue Dulcolax suppositories.    Malnutrition Poor oral intake likely secondary to dementia.    Hypotension Sepsis ruled out.  Received albumin x1 with IV fluid bolus.  Latest blood pressure 962/ 59  Metabolic acidosis Received bicarb drip.   Possible dementia with acute delirium/acute metabolic encephalopathy -No diagnosis of definite dementia.  Patient does have some cognitive decline at home.  CT was unremarkable.   MRI of the brain showed no acute reversible findings.   Atrial fibrillation/complete heart block status post pacemaker placement -CHA2DS2-VASc score 5. Off xarelto due to plan for hospice care.. Currently in paced rhythm  CAD  Non essential meds discontinued.  Essential hypertension received  IV fluid bolus for hypotension..  Hyperlipidemia -off statin   Generalized weakness -PT and OT has recommended SNF but goals of care discussion has been made and patient is for hospice care  Subacute nondisplaced left fourth rib fracture  Conservative treatment  OSA  -Supportive care  BPHwith urinary retention Was on finasteride. Ultrasound 04/30/2020 showed enlarged prostate but no hydronephrosis.  History of multiple myeloma.  Patient follows up with oncology as outpatient.  Now considering hospice care  Ethics.  Overall prognosis of the patient is poor.  Patient has multiple underlying issues including multiple myeloma ,failure  to thrive and is under going hypoglycemia, acute kidney injury and hypotension.  Palliative care has had discussed with the family and the plan is hospice care at this time.   Disposition.  At this time, patient is stable for disposition to residential hospice.  Medical Consultants:   Palliative care Procedures:    Renal ultrasound Subjective:   Today, patient was seen and examined at bedside.  Hard of hearing.  Mostly non verbal. Vacant staring.   Discharge Exam:   Vitals:   05/06/20 2327 05/07/20 0341  BP: (!) 110/52 (!) 114/59  Pulse:    Resp: 18 18  Temp: (!) 97.3 F (36.3 C) (!) 97.4 F (36.3 C)  SpO2:     Vitals:   05/06/20 1322 05/06/20 2108 05/06/20 2327 05/07/20 0341  BP: (!) 97/49 (!) 105/51 (!) 110/52 (!) 114/59  Pulse: 60     Resp: 16 20 18 18   Temp: (!) 97.3 F (36.3 C) (!) 97.4 F (36.3 C) (!) 97.3 F (36.3 C) (!) 97.4 F (36.3 C)  TempSrc: Axillary Axillary Axillary Axillary  SpO2: 92% 94%    Weight:    104.3 kg  Height:        GENERAL: Patient is chronically ill, elderly, non verbal today.   not in obvious distress.  Alert and awake, HENT: No scleral pallor or icterus. Pupils equally reactive to light. Oral mucosa is moist.  NECK: is supple, no gross swelling noted. CHEST:    Diminished breath sounds bilaterally. CVS: S1 and S2 heard, no murmur. Regular rate and rhythm.  ABDOMEN: Soft, non-tender, bowel sounds are present.External urinary catheter in place. EXTREMITIES: No edema.  Right upper extremity on mittens.   CNS: mostly nonverbal today moves all extremities SKIN: warm and dry without rashes.   The results of significant diagnostics from this hospitalization (including imaging, microbiology, ancillary and laboratory) are listed below for reference.     Diagnostic Studies:   DG Chest 1 View  Result Date: 04/30/2020 CLINICAL DATA:  Hypotension EXAM: CHEST  1 VIEW COMPARISON:  03/25/2020 FINDINGS: Left-sided implanted cardiac device,  unchanged. The heart size and mediastinal contours are within normal limits. Atherosclerotic calcification of the aortic knob. Low lung volumes with streaky left basilar opacity, favoring atelectasis. Lungs appear otherwise clear. No pleural fluid collection. No pneumothorax. Subacute appearing nondisplaced fracture of the posterior left fourth rib. IMPRESSION: 1. Low lung volumes with streaky left basilar opacity, favoring atelectasis. 2. Subacute appearing nondisplaced fracture of the posterior left fourth rib. Electronically Signed   By: Davina Poke D.O.   On: 04/30/2020 13:20   US RENAL  Result Date: 04/30/2020 CLINICAL DATA:  Acute kidney injury EXAM: RENAL / URINARY TRACT ULTRASOUND COMPLETE COMPARISON:  CT 03/15/2020 FINDINGS: Right Kidney: Renal measurements: 12.7 x 6.1 x 5 cm = volume: 204.9 mL. Echogenicity within normal limits. No mass or hydronephrosis visualized. Left Kidney: Renal measurements: 12.3 x 6.4 x 5 cm = volume: 202.4 mL. Echogenicity within normal limits. Small cyst exophytic to the lower pole measuring 1.6 cm. Bladder: Appears normal for degree of bladder distention. Other: Enlarged prostate with volume of 76.8 mL. IMPRESSION: 1. Negative for hydronephrosis. 2. Enlarged prostate Electronically Signed   By: Donavan Foil M.D.   On: 04/30/2020 20:16     Labs:   Basic Metabolic Panel: Recent Labs  Lab 04/30/20 1257 04/30/20 1257 05/01/20 0449 05/01/20 0449 05/02/20 0220 05/02/20 0220 05/03/20 0422 05/03/20 0422 05/04/20 0642 05/06/20 0828  NA 136   < > 135  --  141  --  141  --  142 137  K 3.6   < > 4.7   < > 4.3   < > 3.9   < > 4.0 3.0*  CL 101   < > 107  --  111  --  111  --  110 100  CO2 19*   < > 17*  --  15*  --  15*  --  12* 27  GLUCOSE 104*   < > 79  --  72  --  65*  --  56* 136*  BUN 18   < > 18  --  16  --  11  --  8 14  CREATININE 2.31*   < > 1.65*  --  1.06  --  0.84  --  0.93 1.64*  CALCIUM 9.2   < > 8.4*  --  8.9  --  8.8*  --  9.2 8.2*  MG 2.0   --   --   --   --   --   --   --   --   --    < > = values in this interval not displayed.   GFR Estimated Creatinine Clearance: 44.2 mL/min (A) (by C-G formula based on SCr of 1.64 mg/dL (H)). Liver Function Tests: Recent Labs  Lab 04/30/20 1257 05/06/20 0828  AST 21 25  ALT 9 9  ALKPHOS 62 44  BILITOT 1.6* 1.2  PROT 6.1* 4.2*  ALBUMIN 3.0* 2.1*   No results for input(s): LIPASE, AMYLASE in the last 168 hours. No results for input(s): AMMONIA in the last 168 hours. Coagulation profile Recent Labs  Lab 04/30/20 1257  INR 2.6*    CBC: Recent Labs  Lab 04/30/20 1657 05/01/20 0449 05/03/20 0422 05/06/20 0828  WBC 6.9 5.4 5.2 5.9  HGB 10.3* 11.0* 11.5* 10.1*  HCT 32.8* 34.8* 34.5* 30.2*  MCV 103.5* 102.4* 99.4 98.7  PLT 204 233 241 196   Cardiac Enzymes: No results for input(s): CKTOTAL, CKMB, CKMBINDEX, TROPONINI in the last 168 hours. BNP: Invalid input(s): POCBNP CBG: Recent Labs  Lab 05/06/20 1210 05/06/20 1515 05/06/20 2106 05/06/20 2326 05/07/20 0347  GLUCAP 130* 128* 139* 131* 125*   D-Dimer No results for input(s): DDIMER in the last 72 hours. Hgb A1c No results for input(s): HGBA1C in the last 72 hours. Lipid Profile No results for input(s): CHOL, HDL, LDLCALC, TRIG, CHOLHDL, LDLDIRECT in the last 72 hours. Thyroid function studies No results for input(s): TSH, T4TOTAL, T3FREE, THYROIDAB in the last 72 hours.  Invalid input(s): FREET3 Anemia work up No results for input(s): VITAMINB12, FOLATE, FERRITIN, TIBC, IRON, RETICCTPCT in the last 72 hours. Microbiology Recent Results (from the past 240 hour(s))  Culture, blood (Routine x 2)     Status: None   Collection Time: 04/30/20 12:45 PM   Specimen: BLOOD  Result Value Ref Range Status   Specimen Description BLOOD LEFT ANTECUBITAL  Final   Special Requests   Final    BOTTLES DRAWN AEROBIC AND ANAEROBIC Blood Culture adequate volume   Culture   Final    NO GROWTH 5 DAYS Performed at [facility], [address], [place], [place] [address]    Report Status 05/05/2020 FINAL  Final  Urine culture     Status: Abnormal   Collection Time: 04/30/20 12:58 PM   Specimen: Urine, Random  Result Value Ref Range Status   Specimen Description URINE, RANDOM  Final   Special Requests  Final    NONE Performed at [facility], [place] [address], [place], [place] [address]    Culture >=100,000 COLONIES/mL ESCHERICHIA COLI (A)  Final   Report Status 05/02/2020 FINAL  Final   Organism ID, Bacteria ESCHERICHIA COLI (A)  Final      Susceptibility   Escherichia coli - MIC*    AMPICILLIN 4 SENSITIVE Sensitive     CEFAZOLIN <=4 SENSITIVE Sensitive     CEFTRIAXONE <=0.25 SENSITIVE Sensitive     CIPROFLOXACIN <=0.25 SENSITIVE Sensitive     GENTAMICIN <=1 SENSITIVE Sensitive     IMIPENEM <=0.25 SENSITIVE Sensitive     NITROFURANTOIN <=16 SENSITIVE Sensitive     TRIMETH/SULFA <=20 SENSITIVE Sensitive     AMPICILLIN/SULBACTAM <=2 SENSITIVE Sensitive     PIP/TAZO <=4 SENSITIVE Sensitive     * >=100,000 COLONIES/mL ESCHERICHIA COLI  Culture, blood (Routine x 2)     Status: Abnormal   Collection Time: 04/30/20  1:00 PM   Specimen: BLOOD  Result Value Ref Range Status   Specimen Description BLOOD RIGHT ANTECUBITAL  Final   Special Requests   Final    BOTTLES DRAWN AEROBIC AND ANAEROBIC Blood Culture adequate volume   Culture  Setup Time   Final    GRAM POSITIVE COCCI IN CLUSTERS AEROBIC BOTTLE ONLY Organism ID to follow CRITICAL RESULT CALLED TO, READ BACK BY AND VERIFIED WITH: Erin Hearing PHARMD @2132  05/01/20 EB    Culture (A)  Final    STAPHYLOCOCCUS EPIDERMIDIS THE SIGNIFICANCE OF ISOLATING THIS ORGANISM FROM A SINGLE SET OF BLOOD CULTURES WHEN MULTIPLE SETS ARE DRAWN IS UNCERTAIN. PLEASE NOTIFY THE MICROBIOLOGY DEPARTMENT WITHIN ONE WEEK IF SPECIATION AND SENSITIVITIES ARE REQUIRED. Performed at [facility], [place] [address], [place], [place] [address]    Report Status  05/03/2020 FINAL  Final  Blood Culture ID Panel (Reflexed)     Status: Abnormal   Collection Time: 04/30/20  1:00 PM  Result Value Ref Range Status   Enterococcus faecalis NOT DETECTED NOT DETECTED Final   Enterococcus Faecium NOT DETECTED NOT DETECTED Final   Listeria monocytogenes NOT DETECTED NOT DETECTED Final   Staphylococcus species DETECTED (A) NOT DETECTED Final    Comment: RESULT CALLED TO, READ BACK BY AND VERIFIED WITH: Erin Hearing PHARMD @2132  05/01/20 EB    Staphylococcus aureus (BCID) NOT DETECTED NOT DETECTED Final   Staphylococcus epidermidis DETECTED (A) NOT DETECTED Final    Comment: Methicillin (oxacillin) resistant coagulase negative staphylococcus. Possible blood culture contaminant (unless isolated from more than one blood culture draw or clinical case suggests pathogenicity). No antibiotic treatment is indicated for blood  culture contaminants. RESULT CALLED TO, READ BACK BY AND VERIFIED WITH: Erin Hearing PHARMD @2132  05/01/20 EB    Staphylococcus lugdunensis NOT DETECTED NOT DETECTED Final   Streptococcus species NOT DETECTED NOT DETECTED Final   Streptococcus agalactiae NOT DETECTED NOT DETECTED Final   Streptococcus pneumoniae NOT DETECTED NOT DETECTED Final   Streptococcus pyogenes NOT DETECTED NOT DETECTED Final   A.calcoaceticus-baumannii NOT DETECTED NOT DETECTED Final   Bacteroides fragilis NOT DETECTED NOT DETECTED Final   Enterobacterales NOT DETECTED NOT DETECTED Final   Enterobacter cloacae complex NOT DETECTED NOT DETECTED Final   Escherichia coli NOT DETECTED NOT DETECTED Final   Klebsiella aerogenes NOT DETECTED NOT DETECTED Final   Klebsiella oxytoca NOT DETECTED NOT DETECTED Final   Klebsiella pneumoniae NOT DETECTED NOT DETECTED Final   Proteus species NOT DETECTED NOT DETECTED Final   Salmonella species NOT DETECTED  NOT DETECTED Final   Serratia marcescens NOT DETECTED NOT DETECTED Final   Haemophilus influenzae NOT DETECTED NOT DETECTED  Final   Neisseria meningitidis NOT DETECTED NOT DETECTED Final   Pseudomonas aeruginosa NOT DETECTED NOT DETECTED Final   Stenotrophomonas maltophilia NOT DETECTED NOT DETECTED Final   Candida albicans NOT DETECTED NOT DETECTED Final   Candida auris NOT DETECTED NOT DETECTED Final   Candida glabrata NOT DETECTED NOT DETECTED Final   Candida krusei NOT DETECTED NOT DETECTED Final   Candida parapsilosis NOT DETECTED NOT DETECTED Final   Candida tropicalis NOT DETECTED NOT DETECTED Final   Cryptococcus neoformans/gattii NOT DETECTED NOT DETECTED Final   Methicillin resistance mecA/C DETECTED (A) NOT DETECTED Final    Comment: RESULT CALLED TO, READ BACK BY AND VERIFIED WITH: Erin Hearing PHARMD @2132  05/01/20 EB Performed at [facility], [address], [place], [place] [address]   SARS Coronavirus 2 by RT PCR (hospital order, performed in [facility] hospital lab) Nasopharyngeal Nasopharyngeal Swab     Status: None   Collection Time: 04/30/20  1:06 PM   Specimen: Nasopharyngeal Swab  Result Value Ref Range Status   SARS Coronavirus 2 NEGATIVE NEGATIVE Final    Comment: (NOTE) SARS-CoV-2 target nucleic acids are NOT DETECTED.  The SARS-CoV-2 RNA is generally detectable in upper and lower respiratory specimens during the acute phase of infection. The lowest concentration of SARS-CoV-2 viral copies this assay can detect is 250 copies / mL. A negative result does not preclude SARS-CoV-2 infection and should not be used as the sole basis for treatment or other patient management decisions.  A negative result may occur with improper specimen collection / handling, submission of specimen other than nasopharyngeal swab, presence of viral mutation(s) within the areas targeted by this assay, and inadequate number of viral copies (<250 copies / mL). A negative result must be combined with clinical observations, patient history, and epidemiological information.  Fact Sheet for Patients:    StrictlyIdeas.no  Fact Sheet for Healthcare Providers: BankingDealers.co.za  This test is not yet approved or  cleared by the [place] FDA and has been authorized for detection and/or diagnosis of SARS-CoV-2 by FDA under an Emergency Use Authorization (EUA).  This EUA will remain in effect (meaning this test can be used) for the duration of the COVID-19 declaration under Section 564(b)(1) of the Act, 21 U.S.C. section 360bbb-3(b)(1), unless the authorization is terminated or revoked sooner.  Performed at [facility], [place] [address] [place], [place] [address]   MRSA PCR Screening     Status: Abnormal   Collection Time: 04/30/20  9:00 PM   Specimen: Nasal Mucosa; Nasopharyngeal  Result Value Ref Range Status   MRSA by PCR POSITIVE (A) NEGATIVE Final    Comment:        The GeneXpert MRSA Assay (FDA approved for NASAL specimens only), is one component of a comprehensive MRSA colonization surveillance program. It is not intended to diagnose MRSA infection nor to guide or monitor treatment for MRSA infections. RESULT CALLED TO, READ BACK BY AND VERIFIED WITH: L LOW RN 05/01/20  0019 JDW Performed at [facility], [place] [address], [place], [place] [address]   SARS Coronavirus 2 by RT PCR (hospital order, performed in [facility] hospital lab) Nasopharyngeal Nasopharyngeal Swab     Status: None   Collection Time: 05/04/20 12:55 PM   Specimen: Nasopharyngeal Swab  Result Value Ref Range Status   SARS Coronavirus 2 NEGATIVE NEGATIVE Final  Comment: (NOTE) SARS-CoV-2 target nucleic acids are NOT DETECTED.  The SARS-CoV-2 RNA is generally detectable in upper and lower respiratory specimens during the acute phase of infection. The lowest concentration of SARS-CoV-2 viral copies this assay can detect is 250 copies / mL. A negative result does not preclude SARS-CoV-2 infection and should not be used as the sole basis  for treatment or other patient management decisions.  A negative result may occur with improper specimen collection / handling, submission of specimen other than nasopharyngeal swab, presence of viral mutation(s) within the areas targeted by this assay, and inadequate number of viral copies (<250 copies / mL). A negative result must be combined with clinical observations, patient history, and epidemiological information.  Fact Sheet for Patients:   StrictlyIdeas.no  Fact Sheet for Healthcare Providers: BankingDealers.co.za  This test is not yet approved or  cleared by the [place] FDA and has been authorized for detection and/or diagnosis of SARS-CoV-2 by FDA under an Emergency Use Authorization (EUA).  This EUA will remain in effect (meaning this test can be used) for the duration of the COVID-19 declaration under Section 564(b)(1) of the Act, 21 U.S.C. section 360bbb-3(b)(1), unless the authorization is terminated or revoked sooner.  Performed at [facility], [place] [address] [place], [place] [address]      Discharge Instructions:   Discharge Instructions    Bed rest   Complete by: As directed    Diet - low sodium heart healthy   Complete by: As directed    Diet general   Complete by: As directed    Discharge instructions   Complete by: As directed    As per hospice provider   Discharge wound care:   Complete by: As directed    Keep dry and clean   No wound care   Complete by: As directed      Allergies as of 05/07/2020      Reactions   Bee Venom Anaphylaxis         Medication List    STOP taking these medications   Magnesium 200 MG Tabs     TAKE these medications   acyclovir 200 MG capsule Commonly known as: ZOVIRAX Take 1 capsule (200 mg total) by mouth 2 (two) times daily.   Alaway 0.025 % ophthalmic solution Generic drug: ketotifen Place 1 drop into both eyes 2 (two) times daily as needed  (for dry eyes).   atorvastatin 10 MG tablet Commonly known as: LIPITOR Take 10 mg by mouth daily at 6 PM.   cephALEXin 500 MG capsule Commonly known as: KEFLEX Take 1 capsule (500 mg total) by mouth every 12 (twelve) hours for 4 days.   EPINEPHrine 0.3 mg/0.3 mL Soaj injection Commonly known as: EPI-PEN Inject 0.3 mg into the muscle once as needed for anaphylaxis.   finasteride 5 MG tablet Commonly known as: PROSCAR Take 5 mg by mouth daily.   furosemide 20 MG tablet Commonly known as: LASIX Take 1 tablet (20 mg total) by mouth daily. What changed:   medication strength  how much to take   losartan 25 MG tablet Commonly known as: COZAAR Take 1 tablet (25 mg total) by mouth daily. What changed:   medication strength  how much to take   melatonin 3 MG Tabs tablet Take 1 tablet (3 mg total) by mouth at bedtime.   multivitamin capsule Take 1 capsule by mouth daily.   mupirocin ointment 2 % Commonly known as: BACTROBAN Apply 1 application topically  daily. Apply with dressing change   nitroGLYCERIN 0.4 MG SL tablet Commonly known as: NITROSTAT PLACE ONE TABLET UNDER THE TONGUE EVERY 5 MINUTES AS NEEDED FOR CHEST PAIN What changed: See the new instructions.   Potassium Chloride ER 20 MEQ Tbcr Take 1 tablet by mouth every other day. What changed: when to take this   prochlorperazine 10 MG tablet Commonly known as: COMPAZINE Take 1 tablet (10 mg total) by mouth every 6 (six) hours as needed for nausea or vomiting.   sodium bicarbonate 650 MG tablet Take 2 tablets (1,300 mg total) by mouth 3 (three) times daily. Take for 1 week, then check BMP   Xarelto 20 MG Tabs tablet Generic drug: rivaroxaban TAKE 1 TABLET BY MOUTH ONCE DAILY WITH SUPPER What changed: See the new instructions.            Discharge Care Instructions  (From admission, onward)         Start     Ordered   05/04/20 0000  Discharge wound care:       Comments: Keep dry and clean    05/04/20 1512          Contact information for after-discharge care    Destination    [facility] .   Service: Skilled Nursing Contact information: [place] [phone]                   Time coordinating discharge: 39 minutes  Signed:  Lucciana Head  [facility] 05/07/2020, 11:09 AM

## 2020-05-07 NOTE — Progress Notes (Signed)
PROGRESS NOTE  [name] MOL:[id number] DOB: 05-01-1941 DOA: 04/30/2020 PCP: [name], MD   LOS: 6 days   Brief narrative: As per HPI,  HPI On 04/30/2020 by Dr. Angelique Holm Fieldsis a 79 y.o.malewith medical history significant forparoxysmal A. fib/flutter on Xarelto, CHB s/p PPM, CAD, multiple myeloma, hypertension, hyperlipidemia, BPH, OSA,very hard of hearing,and dementia who presented to the ED for evaluation of acute encephalopathy.History was limited due to dementia. Patient was recently hospitalized from 03/15/2020-03/27/2020 initially for sepsis due to unknown etiology. No obvious infectious source was found. Hospital course was complicated by acute respiratory failure with hypoxia due to aspiration pneumonia versus drug-induced pneumonitis from chemotherapy. He was eventually discharged to SNF.  Patient was able to go to oncology department when he was found to be unresponsive and confused. Patient was then brought to the ED for further evaluation.  Interim history Patient did had some acute kidney injury with improved and subsequently started deteriorating.  Patient was to be discharged on 05/04/2020 however developed vomiting and noted to have hypoglycemia.    Subsequently he was put on D5 drip.    Abdominal x-ray obtained showing questionable ileus versus partial SBO.  CT abdomen however negative for acute findings.  Assessment/Plan:   Principal Problem:   AKI (acute kidney injury) ([place]) Active Problems:   Multiple myeloma ([place])   Obstructive sleep apnea   Essential hypertension   Paroxysmal atrial fibrillation (HCC)   Acute lower UTI   Hyperlipidemia   Delirium with dementia  Acute kidney injury Likely multifactorial from poor oral intake, hypotension, medication induced.  Creatinine on admission 2.31. Renal ultrasound negative for hydronephrosis, enlarged prostate.  Received IV fluids.  Continue to hold losartan Lasix and acyclovir.    E Coli  UTI Afebrile.  No leukocytosis.  Was initially on IV Rocephin.  Currently on oral Keflex.  Hypoglycemia On D5 water.   Likely secondary to poor oral intake.    Hypokalemia.  Replenished PO yesterday.  ogff bicarbonate drip  Vomiting Improved.  CT scan without bowel obstruction or ileus.  Continue Dulcolax suppositories.    Malnutrition Poor oral intake likely secondary to dementia.    Hypotension Sepsis ruled out.  Received albumin x1 yesterday with IV fluid bolus.  Latest blood pressure 201/ 59  Metabolic acidosis Received bicarb drip.   Possible dementia with acute delirium/acute metabolic encephalopathy -No diagnosis of definite dementia.  Patient does have some cognitive decline at home.  CT was unremarkable.   MRI of the brain showed no acute reversible findings.   Atrial fibrillation/complete heart block status post pacemaker placement -CHA2DS2-VASc score 5. Off xarelto due to plan for hospice care.. Currently in paced rhythm  CAD  Non essential meds discontinued.  Essential hypertension received  IV fluid bolus for hypotension..  Continue to hold Lasix, losartan  Hyperlipidemia -off statin   Generalized weakness -PT and OT has recommended SNF but goals of care discussion has been made and patient is for hospice care  Subacute nondisplaced left fourth rib fracture -Encourage incentive spirometry.  Conservative treatment  OSA  -Supportive care  BPH with urinary retention Was on finasteride. Ultrasound 04/30/2020 showed enlarged prostate but no hydronephrosis.  History of multiple myeloma.  Patient follows up with oncology as outpatient.  Now considering hospice care  Ethics.  Overall prognosis of the patient is poor.  Patient has multiple underlying issues including multiple myeloma ,failure to thrive and is under going hypoglycemia, acute kidney injury and hypotension.  Palliative care  has had discussed with the family and the plan is hospice care at  this time.  DVT prophylaxis: None for hospice care  Code Status: DNR  Family Communication: None.   Status is: Inpatient  Remains inpatient appropriate because: of Unsafe d/c plan,Inpatient level of care appropriate due to severity of illness and hypotension, aki, hospice care plan   Dispo: The patient is from: SNF              Anticipated d/c is to: Hospice              Anticipated d/c date is: 1-2 days              Patient currently is  medically stable to d/c.  Consultants:  Palliative care  Procedures:  Renal ultrasound  Antibiotics:  . Cephalexin  Anti-infectives (From admission, onward)   Start     Dose/Rate Route Frequency Ordered Stop   05/04/20 0000  cephALEXin (KEFLEX) 500 MG capsule        500 mg Oral Every 12 hours 05/04/20 1512 05/14/2020 2359   05/03/20 2200  cephALEXin (KEFLEX) capsule 500 mg        500 mg Oral Every 12 hours 05/03/20 1045     05/01/20 1300  cefTRIAXone (ROCEPHIN) 1 g in sodium chloride 0.9 % 100 mL IVPB  Status:  Discontinued        1 g 200 mL/hr over 30 Minutes Intravenous Every 24 hours 04/30/20 1915 04/30/20 2000   05/01/20 0100  cefTRIAXone (ROCEPHIN) 1 g in sodium chloride 0.9 % 100 mL IVPB  Status:  Discontinued        1 g 200 mL/hr over 30 Minutes Intravenous Every 24 hours 04/30/20 2000 05/03/20 1045   04/30/20 1315  vancomycin (VANCOREADY) IVPB 2000 mg/400 mL        2,000 mg 200 mL/hr over 120 Minutes Intravenous  Once 04/30/20 1301 04/30/20 1703   04/30/20 1315  ceFEPIme (MAXIPIME) 2 g in sodium chloride 0.9 % 100 mL IVPB        2 g 200 mL/hr over 30 Minutes Intravenous  Once 04/30/20 1301 04/30/20 1359     Subjective: Today, patient was seen and examined at bedside.  Hard of hearing.  Mostly non verbal. Vacant staring.   Objective: Vitals:   05/06/20 2327 05/07/20 0341  BP: (!) 110/52 (!) 114/59  Pulse:    Resp: 18 18  Temp: (!) 97.3 F (36.3 C) (!) 97.4 F (36.3 C)  SpO2:      Intake/Output Summary (Last 24  hours) at 05/07/2020 0856 Last data filed at 05/07/2020 0500 Gross per 24 hour  Intake 2250.82 ml  Output --  Net 2250.82 ml   Filed Weights   05/05/20 0500 05/06/20 0449 05/07/20 0341  Weight: 100.2 kg 101.6 kg 104.3 kg   Body mass index is 32.08 kg/m.   Physical Exam:  GENERAL: Patient is chronically ill, elderly, non verbal today.   not in obvious distress.  Alert and awake, HENT: No scleral pallor or icterus. Pupils equally reactive to light. Oral mucosa is moist.  NECK: is supple, no gross swelling noted. CHEST:    Diminished breath sounds bilaterally. CVS: S1 and S2 heard, no murmur. Regular rate and rhythm.  ABDOMEN: Soft, non-tender, bowel sounds are present.External urinary catheter in place. EXTREMITIES: No edema.  Right upper extremity on mittens.   CNS: mostly nonverbal today moves all extremities SKIN: warm and dry without rashes.  Data Review: I have personally  reviewed the following laboratory data and studies,  CBC: Recent Labs  Lab 04/30/20 1657 05/01/20 0449 05/03/20 0422 05/06/20 0828  WBC 6.9 5.4 5.2 5.9  HGB 10.3* 11.0* 11.5* 10.1*  HCT 32.8* 34.8* 34.5* 30.2*  MCV 103.5* 102.4* 99.4 98.7  PLT 204 233 241 161   Basic Metabolic Panel: Recent Labs  Lab 04/30/20 1257 04/30/20 1257 05/01/20 0449 05/02/20 0220 05/03/20 0422 05/04/20 0642 05/06/20 0828  NA 136   < > 135 141 141 142 137  K 3.6   < > 4.7 4.3 3.9 4.0 3.0*  CL 101   < > 107 111 111 110 100  CO2 19*   < > 17* 15* 15* 12* 27  GLUCOSE 104*   < > 79 72 65* 56* 136*  BUN 18   < > 18 16 11 8 14   CREATININE 2.31*   < > 1.65* 1.06 0.84 0.93 1.64*  CALCIUM 9.2   < > 8.4* 8.9 8.8* 9.2 8.2*  MG 2.0  --   --   --   --   --   --    < > = values in this interval not displayed.   Liver Function Tests: Recent Labs  Lab 04/30/20 1257 05/06/20 0828  AST 21 25  ALT 9 9  ALKPHOS 62 44  BILITOT 1.6* 1.2  PROT 6.1* 4.2*  ALBUMIN 3.0* 2.1*   No results for input(s): LIPASE, AMYLASE in the  last 168 hours. No results for input(s): AMMONIA in the last 168 hours. Cardiac Enzymes: No results for input(s): CKTOTAL, CKMB, CKMBINDEX, TROPONINI in the last 168 hours. BNP (last 3 results) Recent Labs    03/15/20 1138 03/22/20 1600  BNP 132.6* 215.1*    ProBNP (last 3 results) No results for input(s): PROBNP in the last 8760 hours.  CBG: Recent Labs  Lab 05/06/20 1210 05/06/20 1515 05/06/20 2106 05/06/20 2326 05/07/20 0347  GLUCAP 130* 128* 139* 131* 125*   Recent Results (from the past 240 hour(s))  Culture, blood (Routine x 2)     Status: None   Collection Time: 04/30/20 12:45 PM   Specimen: BLOOD  Result Value Ref Range Status   Specimen Description BLOOD LEFT ANTECUBITAL  Final   Special Requests   Final    BOTTLES DRAWN AEROBIC AND ANAEROBIC Blood Culture adequate volume   Culture   Final    NO GROWTH 5 DAYS Performed at [facility], [address], [place], [place] [address]    Report Status 05/05/2020 FINAL  Final  Urine culture     Status: Abnormal   Collection Time: 04/30/20 12:58 PM   Specimen: Urine, Random  Result Value Ref Range Status   Specimen Description URINE, RANDOM  Final   Special Requests   Final    NONE Performed at [facility], [place] [address], [place], [place] [address]    Culture >=100,000 COLONIES/mL ESCHERICHIA COLI (A)  Final   Report Status 05/02/2020 FINAL  Final   Organism ID, Bacteria ESCHERICHIA COLI (A)  Final      Susceptibility   Escherichia coli - MIC*    AMPICILLIN 4 SENSITIVE Sensitive     CEFAZOLIN <=4 SENSITIVE Sensitive     CEFTRIAXONE <=0.25 SENSITIVE Sensitive     CIPROFLOXACIN <=0.25 SENSITIVE Sensitive     GENTAMICIN <=1 SENSITIVE Sensitive     IMIPENEM <=0.25 SENSITIVE Sensitive     NITROFURANTOIN <=16 SENSITIVE Sensitive     TRIMETH/SULFA <=20 SENSITIVE Sensitive  AMPICILLIN/SULBACTAM <=2 SENSITIVE Sensitive     PIP/TAZO <=4 SENSITIVE Sensitive     * >=100,000 COLONIES/mL ESCHERICHIA  COLI  Culture, blood (Routine x 2)     Status: Abnormal   Collection Time: 04/30/20  1:00 PM   Specimen: BLOOD  Result Value Ref Range Status   Specimen Description BLOOD RIGHT ANTECUBITAL  Final   Special Requests   Final    BOTTLES DRAWN AEROBIC AND ANAEROBIC Blood Culture adequate volume   Culture  Setup Time   Final    GRAM POSITIVE COCCI IN CLUSTERS AEROBIC BOTTLE ONLY Organism ID to follow CRITICAL RESULT CALLED TO, READ BACK BY AND VERIFIED WITH: Erin Hearing PHARMD @2132  05/01/20 EB    Culture (A)  Final    STAPHYLOCOCCUS EPIDERMIDIS THE SIGNIFICANCE OF ISOLATING THIS ORGANISM FROM A SINGLE SET OF BLOOD CULTURES WHEN MULTIPLE SETS ARE DRAWN IS UNCERTAIN. PLEASE NOTIFY THE MICROBIOLOGY DEPARTMENT WITHIN ONE WEEK IF SPECIATION AND SENSITIVITIES ARE REQUIRED. Performed at [facility], [place] [address] [place], [place] [address]    Report Status 05/03/2020 FINAL  Final  Blood Culture ID Panel (Reflexed)     Status: Abnormal   Collection Time: 04/30/20  1:00 PM  Result Value Ref Range Status   Enterococcus faecalis NOT DETECTED NOT DETECTED Final   Enterococcus Faecium NOT DETECTED NOT DETECTED Final   Listeria monocytogenes NOT DETECTED NOT DETECTED Final   Staphylococcus species DETECTED (A) NOT DETECTED Final    Comment: RESULT CALLED TO, READ BACK BY AND VERIFIED WITH: Erin Hearing PHARMD @2132  05/01/20 EB    Staphylococcus aureus (BCID) NOT DETECTED NOT DETECTED Final   Staphylococcus epidermidis DETECTED (A) NOT DETECTED Final    Comment: Methicillin (oxacillin) resistant coagulase negative staphylococcus. Possible blood culture contaminant (unless isolated from more than one blood culture draw or clinical case suggests pathogenicity). No antibiotic treatment is indicated for blood  culture contaminants. RESULT CALLED TO, READ BACK BY AND VERIFIED WITH: Erin Hearing PHARMD @2132  05/01/20 EB    Staphylococcus lugdunensis NOT DETECTED NOT DETECTED Final   Streptococcus  species NOT DETECTED NOT DETECTED Final   Streptococcus agalactiae NOT DETECTED NOT DETECTED Final   Streptococcus pneumoniae NOT DETECTED NOT DETECTED Final   Streptococcus pyogenes NOT DETECTED NOT DETECTED Final   A.calcoaceticus-baumannii NOT DETECTED NOT DETECTED Final   Bacteroides fragilis NOT DETECTED NOT DETECTED Final   Enterobacterales NOT DETECTED NOT DETECTED Final   Enterobacter cloacae complex NOT DETECTED NOT DETECTED Final   Escherichia coli NOT DETECTED NOT DETECTED Final   Klebsiella aerogenes NOT DETECTED NOT DETECTED Final   Klebsiella oxytoca NOT DETECTED NOT DETECTED Final   Klebsiella pneumoniae NOT DETECTED NOT DETECTED Final   Proteus species NOT DETECTED NOT DETECTED Final   Salmonella species NOT DETECTED NOT DETECTED Final   Serratia marcescens NOT DETECTED NOT DETECTED Final   Haemophilus influenzae NOT DETECTED NOT DETECTED Final   Neisseria meningitidis NOT DETECTED NOT DETECTED Final   Pseudomonas aeruginosa NOT DETECTED NOT DETECTED Final   Stenotrophomonas maltophilia NOT DETECTED NOT DETECTED Final   Candida albicans NOT DETECTED NOT DETECTED Final   Candida auris NOT DETECTED NOT DETECTED Final   Candida glabrata NOT DETECTED NOT DETECTED Final   Candida krusei NOT DETECTED NOT DETECTED Final   Candida parapsilosis NOT DETECTED NOT DETECTED Final   Candida tropicalis NOT DETECTED NOT DETECTED Final   Cryptococcus neoformans/gattii NOT DETECTED NOT DETECTED Final   Methicillin resistance mecA/C DETECTED (A) NOT DETECTED Final    Comment:  RESULT CALLED TO, READ BACK BY AND VERIFIED WITH: Erin Hearing PHARMD @2132  05/01/20 EB Performed at [facility] [address], [place], [place] [address]   SARS Coronavirus 2 by RT PCR (hospital order, performed in [facility] hospital lab) Nasopharyngeal Nasopharyngeal Swab     Status: None   Collection Time: 04/30/20  1:06 PM   Specimen: Nasopharyngeal Swab  Result Value Ref Range Status   SARS  Coronavirus 2 NEGATIVE NEGATIVE Final    Comment: (NOTE) SARS-CoV-2 target nucleic acids are NOT DETECTED.  The SARS-CoV-2 RNA is generally detectable in upper and lower respiratory specimens during the acute phase of infection. The lowest concentration of SARS-CoV-2 viral copies this assay can detect is 250 copies / mL. A negative result does not preclude SARS-CoV-2 infection and should not be used as the sole basis for treatment or other patient management decisions.  A negative result may occur with improper specimen collection / handling, submission of specimen other than nasopharyngeal swab, presence of viral mutation(s) within the areas targeted by this assay, and inadequate number of viral copies (<250 copies / mL). A negative result must be combined with clinical observations, patient history, and epidemiological information.  Fact Sheet for Patients:   StrictlyIdeas.no  Fact Sheet for Healthcare Providers: BankingDealers.co.za  This test is not yet approved or  cleared by the [place] FDA and has been authorized for detection and/or diagnosis of SARS-CoV-2 by FDA under an Emergency Use Authorization (EUA).  This EUA will remain in effect (meaning this test can be used) for the duration of the COVID-19 declaration under Section 564(b)(1) of the Act, 21 U.S.C. section 360bbb-3(b)(1), unless the authorization is terminated or revoked sooner.  Performed at [facility], [place] [address] [place], [place] [address]   MRSA PCR Screening     Status: Abnormal   Collection Time: 04/30/20  9:00 PM   Specimen: Nasal Mucosa; Nasopharyngeal  Result Value Ref Range Status   MRSA by PCR POSITIVE (A) NEGATIVE Final    Comment:        The GeneXpert MRSA Assay (FDA approved for NASAL specimens only), is one component of a comprehensive MRSA colonization surveillance program. It is not intended to diagnose MRSA infection nor to  guide or monitor treatment for MRSA infections. RESULT CALLED TO, READ BACK BY AND VERIFIED WITH: L LOW RN 05/01/20  0019 JDW Performed at [facility], [place] [address] [place], [place] [address]   SARS Coronavirus 2 by RT PCR (hospital order, performed in [facility] hospital lab) Nasopharyngeal Nasopharyngeal Swab     Status: None   Collection Time: 05/04/20 12:55 PM   Specimen: Nasopharyngeal Swab  Result Value Ref Range Status   SARS Coronavirus 2 NEGATIVE NEGATIVE Final    Comment: (NOTE) SARS-CoV-2 target nucleic acids are NOT DETECTED.  The SARS-CoV-2 RNA is generally detectable in upper and lower respiratory specimens during the acute phase of infection. The lowest concentration of SARS-CoV-2 viral copies this assay can detect is 250 copies / mL. A negative result does not preclude SARS-CoV-2 infection and should not be used as the sole basis for treatment or other patient management decisions.  A negative result may occur with improper specimen collection / handling, submission of specimen other than nasopharyngeal swab, presence of viral mutation(s) within the areas targeted by this assay, and inadequate number of viral copies (<250 copies / mL). A negative result must be combined with clinical observations, patient history, and epidemiological information.  Fact Sheet for Patients:   StrictlyIdeas.no  Fact Sheet for Healthcare Providers: BankingDealers.co.za  This test is not yet approved or  cleared by the [place] FDA and has been authorized for detection and/or diagnosis of SARS-CoV-2 by FDA under an Emergency Use Authorization (EUA).  This EUA will remain in effect (meaning this test can be used) for the duration of the COVID-19 declaration under Section 564(b)(1) of the Act, 21 U.S.C. section 360bbb-3(b)(1), unless the authorization is terminated or revoked sooner.  Performed at [facility],  [place] [address], [place], [place] [address]      Studies: CT ABDOMEN PELVIS WO CONTRAST  Result Date: 05/05/2020 CLINICAL DATA:  Abdominal distension.  Bowel obstruction suspected. EXAM: CT ABDOMEN AND PELVIS WITHOUT CONTRAST TECHNIQUE: Multidetector CT imaging of the abdomen and pelvis was performed following the standard protocol without IV contrast. COMPARISON:  CT abdomen dated 03/15/2020. FINDINGS: Lower chest: Bibasilar pleural effusions, small to moderate in size, with associated compressive atelectasis. Hepatobiliary: Gallbladder is distended but otherwise unremarkable. No focal liver abnormality is seen. No bile duct dilatation is seen. Pancreas: Pancreas is partially infiltrated with fat but otherwise unremarkable. Spleen: Normal in size without focal abnormality. Adrenals/Urinary Tract: Adrenal glands appear normal. Kidneys are unremarkable without mass, stone or hydronephrosis. No ureteral or bladder calculi. Bladder walls appear thickened but this is likely accentuated by the inadequate bladder distention. Stomach/Bowel: No dilated large or small bowel loops. Diverticulosis of the descending and sigmoid colon but no focal inflammatory change seen to suggest acute diverticulitis. No dilated large or small bowel loops. Stomach is unremarkable, decompressed. Appendix is normal. Vascular/Lymphatic: Aortic atherosclerosis. IVC is decompressed suggesting hypovolemia. Reproductive: Prostate is unremarkable. Other: No free fluid or abscess collection. No free intraperitoneal air. Musculoskeletal: No acute or suspicious osseous finding. Degenerative spondylosis of the lumbar spine, moderate in degree. IMPRESSION: 1. No acute findings within the abdomen or pelvis. No bowel obstruction or evidence of bowel wall inflammation. No evidence of acute solid organ abnormality. No renal or ureteral calculi. Appendix is normal. 2. Bibasilar pleural effusions, small to moderate in size, with associated compressive  atelectasis. 3. IVC is decompressed suggesting hypovolemia. 4. Colonic diverticulosis without evidence of acute diverticulitis. Aortic Atherosclerosis (ICD10-I70.0). Electronically Signed   By: Franki Cabot M.D.   On: 05/05/2020 14:30   CT HEAD WO CONTRAST  Result Date: 05/05/2020 CLINICAL DATA:  Delirium, altered mental status. EXAM: CT HEAD WITHOUT CONTRAST TECHNIQUE: Contiguous axial images were obtained from the base of the skull through the vertex without intravenous contrast. COMPARISON:  CT head 03/19/2020 FINDINGS: Brain: No acute hemorrhage. No evidence of acute large vascular territory infarct. Similar hypodensity in the inferior right basal ganglia, most likely a dilated perivascular space. Similar patchy white matter hypoattenuation, nonspecific but most likely the sequela of chronic microvascular ischemic disease. Left basal ganglia calcification, unchanged. Mild diffuse cerebral volume loss with ex vacuo ventricular dilation. No hydrocephalus. Vascular: Calcific intracranial atherosclerosis without evidence of a hyperdense vessel. Skull: Posterior scalp contusion without underlying fracture. Sinuses/Orbits: Mild mucosal thickening of the inferior maxillary sinuses, scattered ethmoid air cells, and the right greater than left frontal sinuses. Other: No mastoid effusion. IMPRESSION: 1. No acute intracranial abnormality. MRI is more sensitive for the detection of acute infarct. 2. Posterior scalp contusion without underlying fracture. 3. Similar chronic microvascular ischemic disease. Electronically Signed   By: Margaretha Sheffield MD   On: 05/05/2020 14:28   MR BRAIN WO CONTRAST  Result Date: 05/06/2020 CLINICAL DATA:  Mental  status changes. EXAM: MRI HEAD WITHOUT CONTRAST TECHNIQUE: Multiplanar, multiecho pulse sequences of the brain and surrounding structures were obtained without intravenous contrast. COMPARISON:  Head CT 05/05/2020 FINDINGS: Brain: Diffusion imaging does not show any acute or  subacute infarction. There are chronic small-vessel changes of the pons. No focal cerebellar insult. Cerebral hemispheres show age related atrophy and chronic small-vessel ischemic changes of the white matter. Dilated perivascular spaces present at the base of the brain and in the basal ganglia regions. No cortical or large vessel territory infarction. No mass lesion, hemorrhage, hydrocephalus or extra-axial collection. Vascular: Major vessels at the base of the brain show flow. Skull and upper cervical spine: Negative Sinuses/Orbits: Mucosal thickening of the maxillary sinuses and ethmoid sinuses. Other: None IMPRESSION: 1. No acute or reversible finding. Age related atrophy and chronic small-vessel ischemic changes of the pons and cerebral hemispheric white matter. 2. Mucosal thickening of the maxillary sinuses and ethmoid sinuses. Electronically Signed   By: Nelson Chimes M.D.   On: 05/06/2020 13:59      Flora Lipps, MD  [facility] 05/07/2020

## 2020-05-07 NOTE — Plan of Care (Signed)
  Problem: Clinical Measurements: Goal: Respiratory complications will improve Outcome: Progressing Goal: Cardiovascular complication will be avoided Outcome: Progressing   Problem: Coping: Goal: Level of anxiety will decrease Outcome: Progressing   Problem: Elimination: Goal: Will not experience complications related to bowel motility Outcome: Progressing Goal: Will not experience complications related to urinary retention Outcome: Progressing   Problem: Pain Managment: Goal: General experience of comfort will improve Outcome: Progressing   

## 2020-05-07 NOTE — Care Management Important Message (Signed)
Important Message  Patient Details  Name: [name] MRN: [id number] Date of Birth: 06/29/1941   Medicare Important Message Given:  Other (see comment)  Pt. Unable to sign due to medical condition.     Holli Humbles Smith 05/07/2020, 4:33 PM

## 2020-05-07 NOTE — Progress Notes (Signed)
[facility] here to pick up pt; destination verified with son via phone, and pt placed on stretcher for transfer to [place] at this time.

## 2020-05-07 NOTE — Progress Notes (Signed)
Palliative Medicine RN Note: Rec'd a call from pt's family member Stanford Breed [phone]) requesting that we also send hospice referral to [facility]. I reached out to Webb Silversmith, who is liaison for both [facility]; she will discuss both when she speaks with family.  Marjie Skiff Ramir Malerba, RN, BSN, [facility] Palliative Medicine Team 05/07/2020 8:17 AM Office [phone]

## 2020-05-07 NOTE — TOC Transition Note (Signed)
Transition of Care [facility]) - CM/SW Discharge Note   Patient Details  Name: [name] MRN: [id number] Date of Birth: 28-Jul-1941  Transition of Care [facility]) CM/SW Contact:  Trula Ore, [place] Phone Number: 05/07/2020, 11:22 AM   Clinical Narrative:     Patient will DC to: [place]  Anticipated DC date: 05/07/2020  Family notified: [name]  Transport by: Corey Harold  ?  Per MD patient ready for DC to [place] . RN, patient, patient's family, and facility notified of DC.RN given number for report tele#[phone]. DC packet on chart.DNR on chart signed. Ambulance transport requested for patient.  CSW signing off.  Final next level of care: [place] Barriers to Discharge: No Barriers Identified   Patient Goals and CMS Choice Patient states their goals for this hospitalization and ongoing recovery are:: to go to SNF CMS Medicare.gov Compare Post Acute Care list provided to:: Patient Represenative (must comment) [name] pateints daughter) Choice offered to / list presented to : Adult Children [name])  Discharge Placement              Patient chooses bed at:  ([facility]) Patient to be transferred to facility by: [place] Name of family member notified: [name] Patient and family notified of of transfer: 05/07/20  Discharge Plan and Services In-house Referral: Clinical Social Work   Post Acute Care Choice: [place]                               Social Determinants of Health (SDOH) Interventions     Readmission Risk Interventions Readmission Risk Prevention Plan 05/01/2020  Transportation Screening Complete  Skilled Nursing Facility Complete  Some recent data might be hidden

## 2020-05-13 ENCOUNTER — Telehealth: Payer: Self-pay | Admitting: Medical Oncology

## 2020-05-13 NOTE — Telephone Encounter (Signed)
[name] passed away on 01-Jun-2020.

## 2020-05-13 DEATH — deceased

## 2020-06-10 DIAGNOSIS — R627 Adult failure to thrive: Secondary | ICD-10-CM

## 2020-11-11 ENCOUNTER — Ambulatory Visit (INDEPENDENT_AMBULATORY_CARE_PROVIDER_SITE_OTHER): Payer: Medicare HMO | Admitting: Otolaryngology

## 2020-12-07 IMAGING — CT CT HEAD W/O CM
4 series · 17 of 47 positions shown, 19 images · non-contrast
Comparison: 03/15/2020

CLINICAL DATA: Encephalopathy

EXAM:
CT HEAD WITHOUT CONTRAST
TECHNIQUE: Contiguous axial images were obtained from the base of the skull
through the vertex without intravenous contrast.

[Series 3: head without · axial · non-contrast · 0.46mm/px · z∈[-127,-2]mm · 6 of 37 slices shown, 8 images]
[im 6/37  brain]
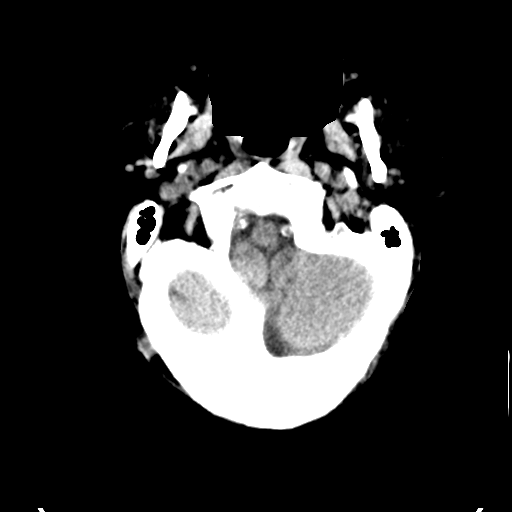
[im 6/37  bone]
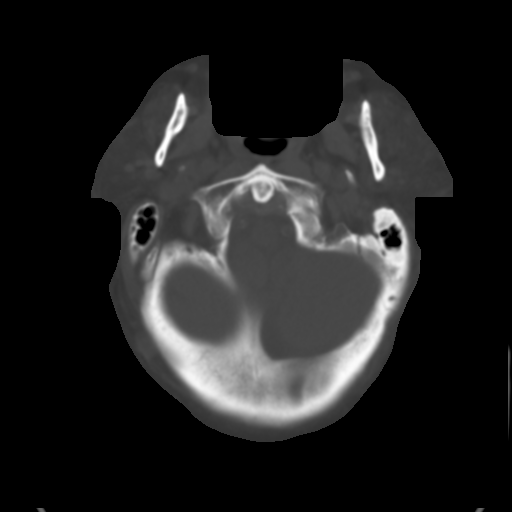
[im 11/37  brain]
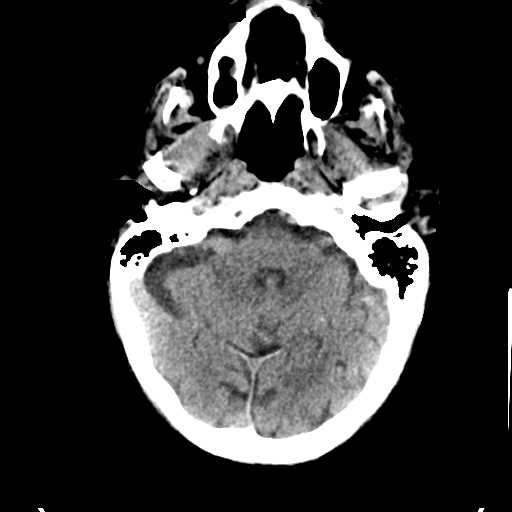
[im 16/37  brain]
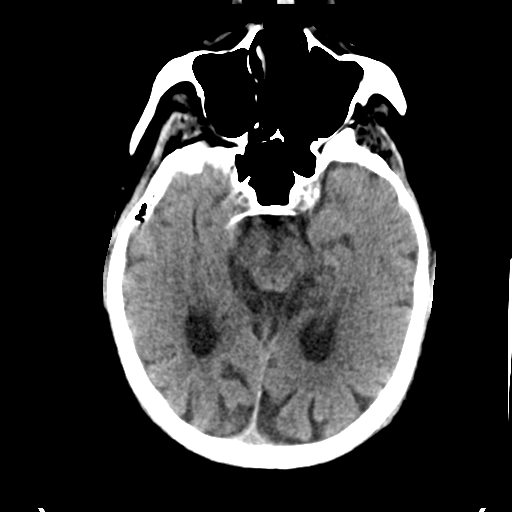
[im 21/37  brain]
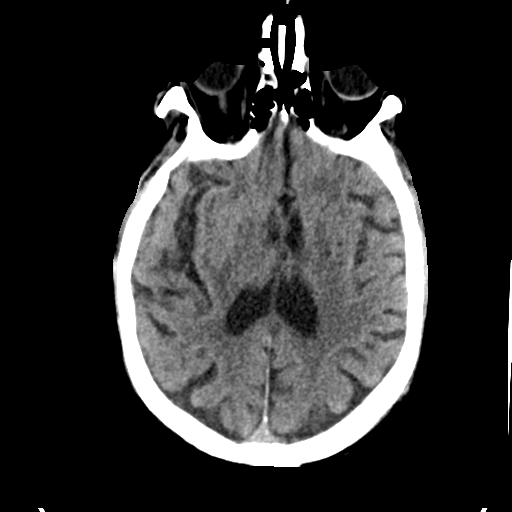
[im 26/37  brain]
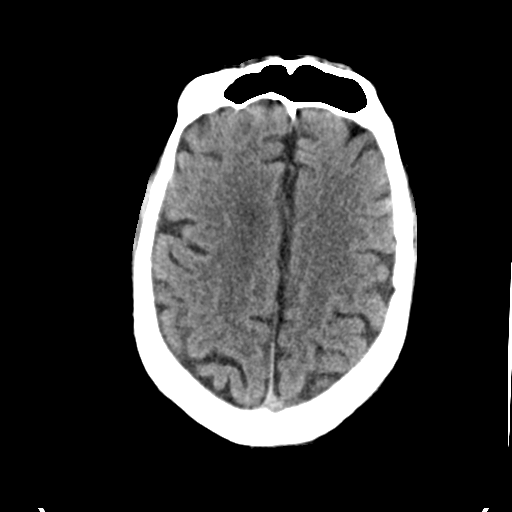
[im 26/37  bone]
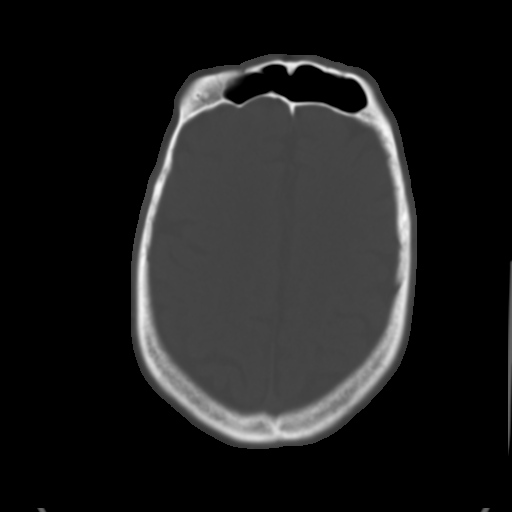
[im 31/37  brain]
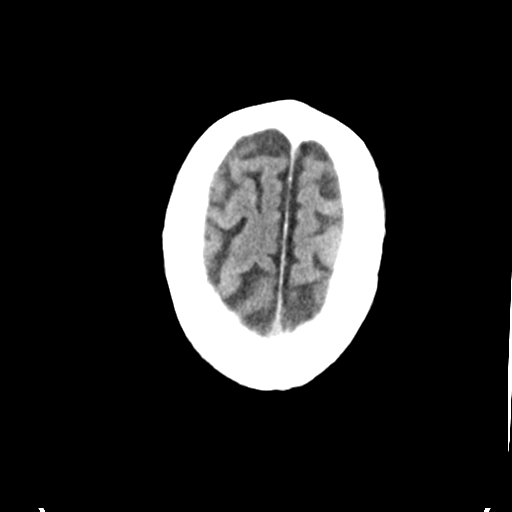

[Series 4: head bone · axial · 0.46mm/px · z∈[-136,-46]mm · 5 of 94 slices shown]
[im 9/94  bone]
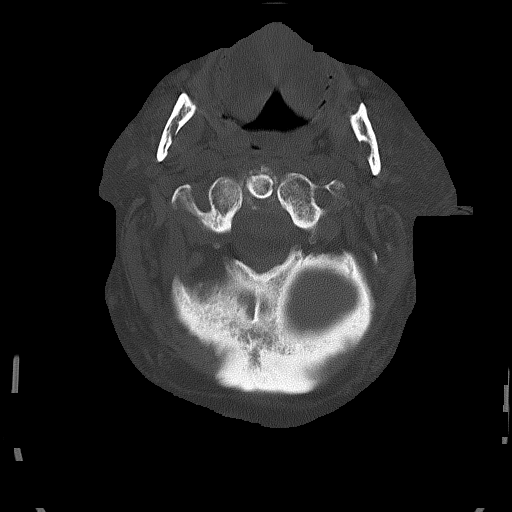
[im 18/94  bone]
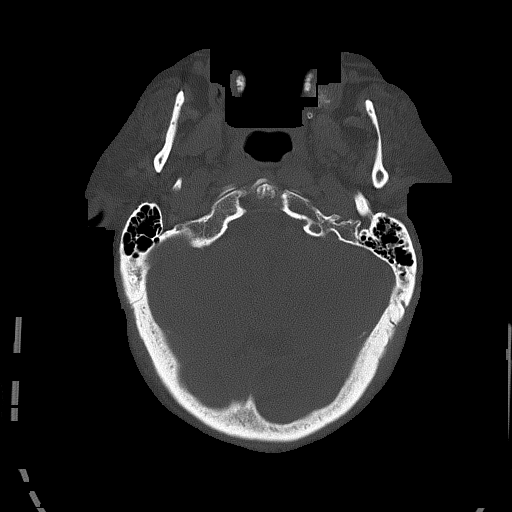
[im 32/94  bone]
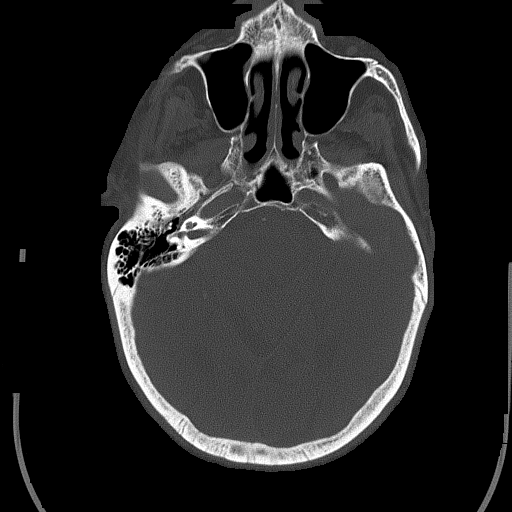
[im 40/94  bone]
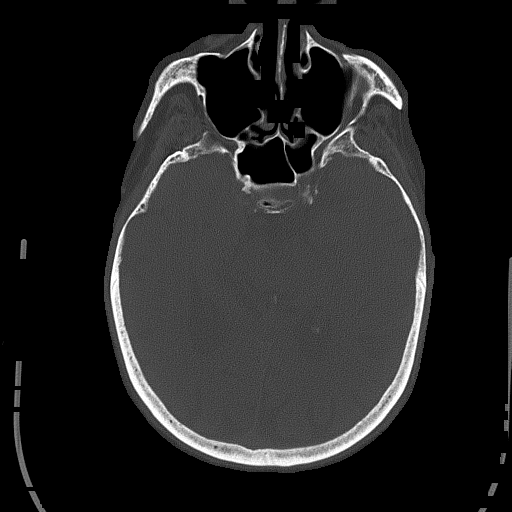
[im 54/94  bone]
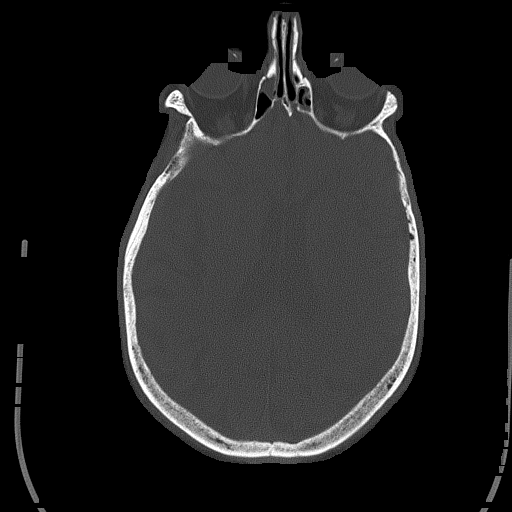

[Series 5: head without cor · coronal · non-contrast · 0.37mm/px · 3 of 78 slices shown]
[im 26/78  brain]
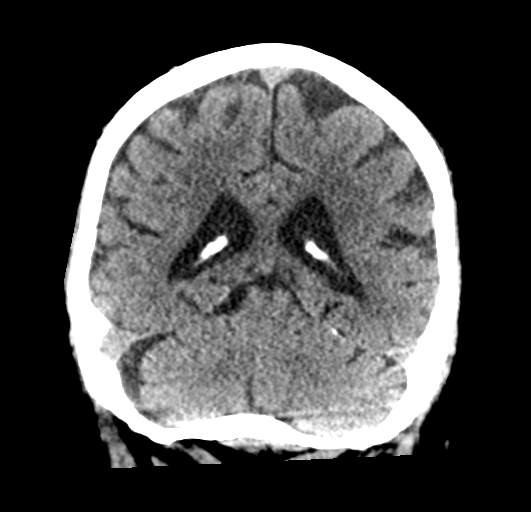
[im 35/78  brain]
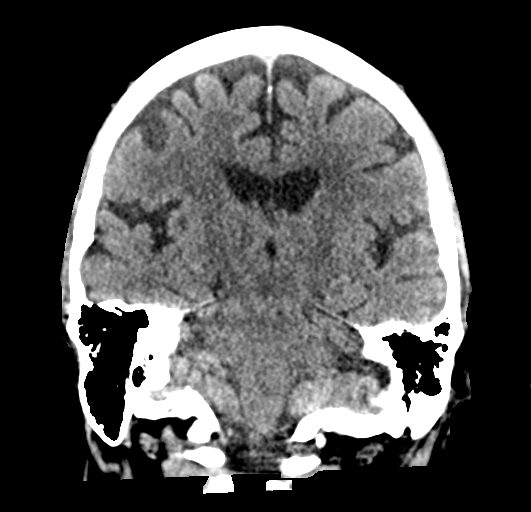
[im 43/78  brain]
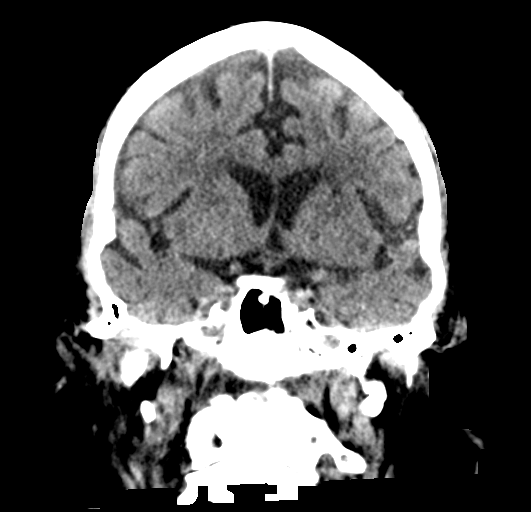

[Series 6: head without sag · sagittal · non-contrast · 0.38mm/px · 3 of 63 slices shown]
[im 21/63  brain]
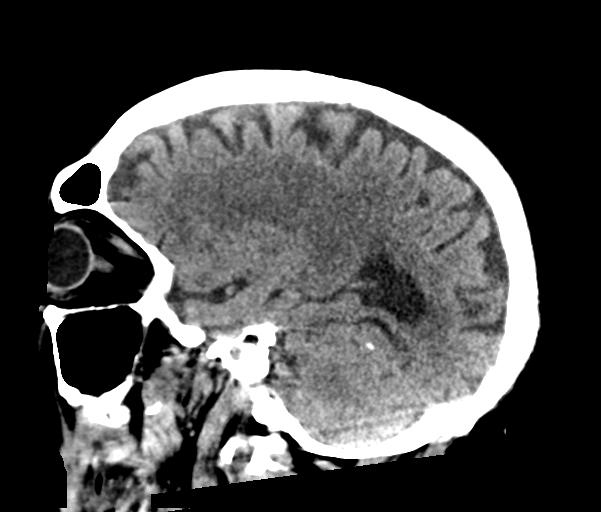
[im 32/63  brain]
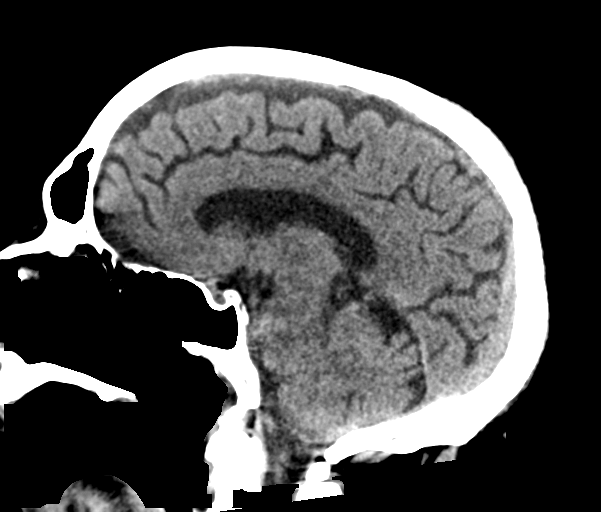
[im 42/63  brain]
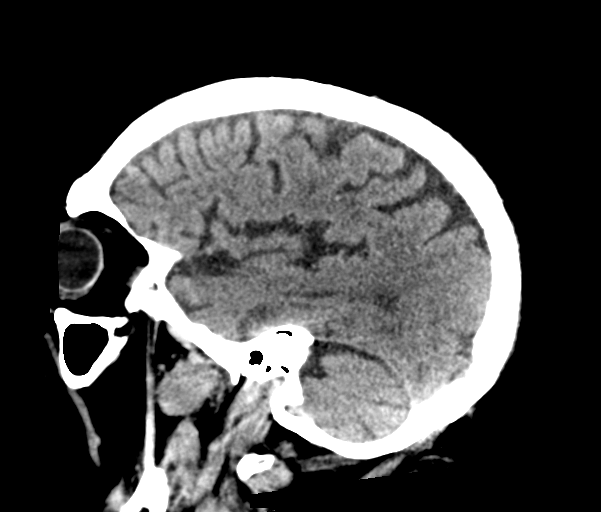

[17 of 47 positions shown; findings below may reference images not displayed]

FINDINGS: Brain: There is no mass, hemorrhage or extra-axial collection. The
size and configuration of the ventricles and extra-axial CSF spaces
are normal. There is hypoattenuation of the white matter, most
commonly indicating chronic small vessel disease.

Vascular: Atherosclerotic calcification of the vertebral and
internal carotid arteries at the skull base. No abnormal
hyperdensity of the major intracranial arteries or dural venous
sinuses.

Skull: Unchanged vertex scalp hematoma.  No skull fracture.

Sinuses/Orbits: No fluid levels or advanced mucosal thickening of
the visualized paranasal sinuses. No mastoid or middle ear effusion.
The orbits are normal.
IMPRESSION: 1. No acute intracranial abnormality.
2. Unchanged vertex scalp hematoma without skull fracture.
3. Chronic small vessel disease.
